# Patient Record
Sex: Female | Born: 1950
Health system: Southern US, Community
[De-identification: ages and names within clinical notes are randomized; demographics above are authoritative.]

## PROBLEM LIST (undated history)

## (undated) DIAGNOSIS — Z973 Presence of spectacles and contact lenses: Secondary | ICD-10-CM

## (undated) DIAGNOSIS — I7771 Dissection of carotid artery: Secondary | ICD-10-CM

## (undated) DIAGNOSIS — F419 Anxiety disorder, unspecified: Secondary | ICD-10-CM

## (undated) DIAGNOSIS — F17200 Nicotine dependence, unspecified, uncomplicated: Secondary | ICD-10-CM

## (undated) DIAGNOSIS — K7460 Unspecified cirrhosis of liver: Secondary | ICD-10-CM

## (undated) DIAGNOSIS — I2510 Atherosclerotic heart disease of native coronary artery without angina pectoris: Secondary | ICD-10-CM

## (undated) DIAGNOSIS — Z9289 Personal history of other medical treatment: Secondary | ICD-10-CM

## (undated) DIAGNOSIS — I639 Cerebral infarction, unspecified: Secondary | ICD-10-CM

## (undated) DIAGNOSIS — G8929 Other chronic pain: Secondary | ICD-10-CM

## (undated) DIAGNOSIS — I10 Essential (primary) hypertension: Secondary | ICD-10-CM

## (undated) DIAGNOSIS — K759 Inflammatory liver disease, unspecified: Secondary | ICD-10-CM

## (undated) DIAGNOSIS — R011 Cardiac murmur, unspecified: Secondary | ICD-10-CM

## (undated) DIAGNOSIS — E039 Hypothyroidism, unspecified: Secondary | ICD-10-CM

## (undated) DIAGNOSIS — E669 Obesity, unspecified: Secondary | ICD-10-CM

## (undated) DIAGNOSIS — F32A Depression, unspecified: Secondary | ICD-10-CM

## (undated) DIAGNOSIS — N200 Calculus of kidney: Secondary | ICD-10-CM

## (undated) DIAGNOSIS — E785 Hyperlipidemia, unspecified: Secondary | ICD-10-CM

## (undated) DIAGNOSIS — I214 Non-ST elevation (NSTEMI) myocardial infarction: Secondary | ICD-10-CM

## (undated) DIAGNOSIS — M545 Low back pain, unspecified: Secondary | ICD-10-CM

## (undated) DIAGNOSIS — Z8719 Personal history of other diseases of the digestive system: Secondary | ICD-10-CM

## (undated) DIAGNOSIS — K219 Gastro-esophageal reflux disease without esophagitis: Secondary | ICD-10-CM

## (undated) DIAGNOSIS — C50912 Malignant neoplasm of unspecified site of left female breast: Secondary | ICD-10-CM

## (undated) DIAGNOSIS — I4891 Unspecified atrial fibrillation: Secondary | ICD-10-CM

## (undated) DIAGNOSIS — G43909 Migraine, unspecified, not intractable, without status migrainosus: Secondary | ICD-10-CM

## (undated) DIAGNOSIS — C4362 Malignant melanoma of left upper limb, including shoulder: Secondary | ICD-10-CM

## (undated) DIAGNOSIS — J449 Chronic obstructive pulmonary disease, unspecified: Secondary | ICD-10-CM

## (undated) DIAGNOSIS — M1990 Unspecified osteoarthritis, unspecified site: Secondary | ICD-10-CM

## (undated) DIAGNOSIS — F329 Major depressive disorder, single episode, unspecified: Secondary | ICD-10-CM

## (undated) DIAGNOSIS — R001 Bradycardia, unspecified: Secondary | ICD-10-CM

## (undated) DIAGNOSIS — C44701 Unspecified malignant neoplasm of skin of unspecified lower limb, including hip: Secondary | ICD-10-CM

## (undated) DIAGNOSIS — Z87442 Personal history of urinary calculi: Secondary | ICD-10-CM

## (undated) HISTORY — PX: LARYNX SURGERY: SHX692

## (undated) HISTORY — PX: SPLENECTOMY, TOTAL: SHX788

## (undated) HISTORY — DX: Bradycardia, unspecified: R00.1

## (undated) HISTORY — DX: Inflammatory liver disease, unspecified: K75.9

## (undated) HISTORY — DX: Hyperlipidemia, unspecified: E78.5

## (undated) HISTORY — DX: Atherosclerotic heart disease of native coronary artery without angina pectoris: I25.10

## (undated) HISTORY — PX: MELANOMA EXCISION: SHX5266

## (undated) HISTORY — PX: APPENDECTOMY: SHX54

## (undated) HISTORY — DX: Unspecified cirrhosis of liver: K74.60

## (undated) HISTORY — PX: CHOLECYSTECTOMY OPEN: SUR202

## (undated) HISTORY — DX: Essential (primary) hypertension: I10

## (undated) HISTORY — PX: BREAST LUMPECTOMY: SHX2

## (undated) HISTORY — DX: Obesity, unspecified: E66.9

## (undated) HISTORY — DX: Chronic obstructive pulmonary disease, unspecified: J44.9

## (undated) HISTORY — PX: BREAST BIOPSY: SHX20

## (undated) HISTORY — PX: INGUINAL HERNIA REPAIR: SUR1180

## (undated) HISTORY — DX: Nicotine dependence, unspecified, uncomplicated: F17.200

---

## 1976-03-16 HISTORY — PX: ABDOMINAL HYSTERECTOMY: SHX81

## 2000-07-12 ENCOUNTER — Emergency Department (HOSPITAL_COMMUNITY): Admission: EM | Admit: 2000-07-12 | Discharge: 2000-07-12 | Payer: Self-pay | Admitting: Emergency Medicine

## 2001-08-07 ENCOUNTER — Encounter: Payer: Self-pay | Admitting: Emergency Medicine

## 2001-08-07 ENCOUNTER — Emergency Department (HOSPITAL_COMMUNITY): Admission: EM | Admit: 2001-08-07 | Discharge: 2001-08-07 | Payer: Self-pay | Admitting: Emergency Medicine

## 2001-08-08 ENCOUNTER — Ambulatory Visit (HOSPITAL_COMMUNITY): Admission: RE | Admit: 2001-08-08 | Discharge: 2001-08-08 | Payer: Self-pay | Admitting: Otolaryngology

## 2001-08-08 ENCOUNTER — Encounter: Payer: Self-pay | Admitting: Otolaryngology

## 2001-10-11 ENCOUNTER — Emergency Department (HOSPITAL_COMMUNITY): Admission: EM | Admit: 2001-10-11 | Discharge: 2001-10-11 | Payer: Self-pay | Admitting: Emergency Medicine

## 2002-03-30 ENCOUNTER — Emergency Department (HOSPITAL_COMMUNITY): Admission: EM | Admit: 2002-03-30 | Discharge: 2002-03-31 | Payer: Self-pay | Admitting: Emergency Medicine

## 2003-03-17 DIAGNOSIS — I639 Cerebral infarction, unspecified: Secondary | ICD-10-CM

## 2003-03-17 HISTORY — DX: Cerebral infarction, unspecified: I63.9

## 2003-04-17 HISTORY — PX: CAROTID STENT: SHX1301

## 2003-04-22 ENCOUNTER — Emergency Department (HOSPITAL_COMMUNITY): Admission: EM | Admit: 2003-04-22 | Discharge: 2003-04-22 | Payer: Self-pay | Admitting: Emergency Medicine

## 2003-04-25 ENCOUNTER — Ambulatory Visit (HOSPITAL_COMMUNITY): Admission: RE | Admit: 2003-04-25 | Discharge: 2003-04-25 | Payer: Self-pay | Admitting: Family Medicine

## 2003-04-26 ENCOUNTER — Inpatient Hospital Stay (HOSPITAL_COMMUNITY): Admission: EM | Admit: 2003-04-26 | Discharge: 2003-04-30 | Payer: Self-pay | Admitting: Emergency Medicine

## 2003-05-05 ENCOUNTER — Emergency Department (HOSPITAL_COMMUNITY): Admission: EM | Admit: 2003-05-05 | Discharge: 2003-05-05 | Payer: Self-pay | Admitting: *Deleted

## 2003-06-29 ENCOUNTER — Ambulatory Visit (HOSPITAL_COMMUNITY): Admission: RE | Admit: 2003-06-29 | Discharge: 2003-06-29 | Payer: Self-pay | Admitting: Family Medicine

## 2003-07-26 ENCOUNTER — Encounter: Payer: Self-pay | Admitting: Neurology

## 2003-08-20 ENCOUNTER — Inpatient Hospital Stay (HOSPITAL_COMMUNITY): Admission: RE | Admit: 2003-08-20 | Discharge: 2003-08-21 | Payer: Self-pay | Admitting: Interventional Radiology

## 2003-09-12 ENCOUNTER — Ambulatory Visit (HOSPITAL_COMMUNITY): Admission: RE | Admit: 2003-09-12 | Discharge: 2003-09-12 | Payer: Self-pay | Admitting: Interventional Radiology

## 2003-10-25 ENCOUNTER — Emergency Department (HOSPITAL_COMMUNITY): Admission: EM | Admit: 2003-10-25 | Discharge: 2003-10-25 | Payer: Self-pay | Admitting: Emergency Medicine

## 2004-01-31 ENCOUNTER — Ambulatory Visit (HOSPITAL_COMMUNITY): Admission: RE | Admit: 2004-01-31 | Discharge: 2004-01-31 | Payer: Self-pay | Admitting: Interventional Radiology

## 2004-06-20 ENCOUNTER — Ambulatory Visit (HOSPITAL_COMMUNITY): Admission: RE | Admit: 2004-06-20 | Discharge: 2004-06-20 | Payer: Self-pay | Admitting: Neurosurgery

## 2004-08-14 ENCOUNTER — Emergency Department (HOSPITAL_COMMUNITY): Admission: EM | Admit: 2004-08-14 | Discharge: 2004-08-14 | Payer: Self-pay | Admitting: Emergency Medicine

## 2004-10-31 ENCOUNTER — Emergency Department (HOSPITAL_COMMUNITY): Admission: EM | Admit: 2004-10-31 | Discharge: 2004-11-01 | Payer: Self-pay | Admitting: *Deleted

## 2005-01-19 ENCOUNTER — Ambulatory Visit (HOSPITAL_COMMUNITY): Admission: RE | Admit: 2005-01-19 | Discharge: 2005-01-19 | Payer: Self-pay | Admitting: Family Medicine

## 2005-12-16 ENCOUNTER — Ambulatory Visit (HOSPITAL_COMMUNITY): Admission: RE | Admit: 2005-12-16 | Discharge: 2005-12-16 | Payer: Self-pay | Admitting: Family Medicine

## 2005-12-18 ENCOUNTER — Encounter: Payer: Self-pay | Admitting: Interventional Radiology

## 2005-12-23 ENCOUNTER — Ambulatory Visit (HOSPITAL_COMMUNITY): Admission: RE | Admit: 2005-12-23 | Discharge: 2005-12-23 | Payer: Self-pay | Admitting: Interventional Radiology

## 2007-04-22 ENCOUNTER — Ambulatory Visit (HOSPITAL_COMMUNITY): Admission: RE | Admit: 2007-04-22 | Discharge: 2007-04-22 | Payer: Self-pay | Admitting: Family Medicine

## 2007-05-04 ENCOUNTER — Ambulatory Visit (HOSPITAL_COMMUNITY): Admission: RE | Admit: 2007-05-04 | Discharge: 2007-05-04 | Payer: Self-pay | Admitting: Family Medicine

## 2007-10-05 ENCOUNTER — Ambulatory Visit (HOSPITAL_COMMUNITY): Admission: RE | Admit: 2007-10-05 | Discharge: 2007-10-05 | Payer: Self-pay | Admitting: Family Medicine

## 2008-05-30 ENCOUNTER — Inpatient Hospital Stay (HOSPITAL_COMMUNITY): Admission: EM | Admit: 2008-05-30 | Discharge: 2008-06-02 | Payer: Self-pay | Admitting: Emergency Medicine

## 2008-06-02 ENCOUNTER — Encounter: Payer: Self-pay | Admitting: Internal Medicine

## 2008-09-23 ENCOUNTER — Emergency Department (HOSPITAL_COMMUNITY): Admission: EM | Admit: 2008-09-23 | Discharge: 2008-09-23 | Payer: Self-pay | Admitting: Emergency Medicine

## 2008-09-24 ENCOUNTER — Ambulatory Visit (HOSPITAL_COMMUNITY): Admission: RE | Admit: 2008-09-24 | Discharge: 2008-09-24 | Payer: Self-pay | Admitting: Emergency Medicine

## 2008-12-29 ENCOUNTER — Emergency Department (HOSPITAL_COMMUNITY): Admission: EM | Admit: 2008-12-29 | Discharge: 2008-12-29 | Payer: Self-pay | Admitting: Emergency Medicine

## 2009-03-16 HISTORY — PX: COLONOSCOPY: SHX174

## 2009-04-16 ENCOUNTER — Ambulatory Visit (HOSPITAL_COMMUNITY): Admission: RE | Admit: 2009-04-16 | Discharge: 2009-04-16 | Payer: Self-pay | Admitting: Family Medicine

## 2009-05-28 ENCOUNTER — Emergency Department (HOSPITAL_COMMUNITY): Admission: EM | Admit: 2009-05-28 | Discharge: 2009-05-28 | Payer: Self-pay | Admitting: Emergency Medicine

## 2010-04-06 ENCOUNTER — Encounter: Payer: Self-pay | Admitting: Interventional Radiology

## 2010-04-06 ENCOUNTER — Encounter: Payer: Self-pay | Admitting: Family Medicine

## 2010-06-26 LAB — CARDIAC PANEL(CRET KIN+CKTOT+MB+TROPI)
Relative Index: INVALID (ref 0.0–2.5)
Relative Index: INVALID (ref 0.0–2.5)
Total CK: 52 U/L (ref 7–177)
Total CK: 56 U/L (ref 7–177)
Total CK: 58 U/L (ref 7–177)
Troponin I: <0.01 ng/mL (ref 0.00–0.06)
Troponin I: <0.01 ng/mL (ref 0.00–0.06)

## 2010-06-26 LAB — CBC
Hemoglobin: 13 g/dL (ref 12.0–15.0)
MCHC: 34 g/dL (ref 30.0–36.0)
WBC: 10.2 10*3/uL (ref 4.0–10.5)

## 2010-06-26 LAB — APTT: aPTT: 26 seconds (ref 24–37)

## 2010-06-26 LAB — DIFFERENTIAL
Lymphocytes Relative: 23 % (ref 12–46)
Lymphs Abs: 2.3 10*3/uL (ref 0.7–4.0)
Monocytes Relative: 8 % (ref 3–12)
Neutrophils Relative %: 69 % (ref 43–77)

## 2010-06-26 LAB — COMPREHENSIVE METABOLIC PANEL
ALT: 17 U/L (ref 0–35)
Albumin: 3.7 g/dL (ref 3.5–5.2)
CO2: 27 mEq/L (ref 19–32)
GFR calc non Af Amer: >60 mL/min (ref >60)
Glucose, Bld: 90 mg/dL (ref 70–99)
Potassium: 3.7 mEq/L (ref 3.5–5.1)
Sodium: 133 mEq/L — ABNORMAL LOW (ref 135–145)

## 2010-06-26 LAB — MAGNESIUM: Magnesium: 2.1 mg/dL (ref 1.5–2.5)

## 2010-06-26 LAB — BASIC METABOLIC PANEL
BUN: 14 mg/dL (ref 6–23)
CO2: 28 mEq/L (ref 19–32)
Creatinine, Ser: 0.63 mg/dL (ref 0.4–1.2)
GFR calc Af Amer: >60 mL/min (ref >60)
Glucose, Bld: 97 mg/dL (ref 70–99)

## 2010-06-26 LAB — LIPID PANEL
HDL: 35 mg/dL — ABNORMAL LOW (ref >39)
LDL Cholesterol: 109 mg/dL — ABNORMAL HIGH (ref 0–99)
Total CHOL/HDL Ratio: 5.9 RATIO
Triglycerides: 304 mg/dL — ABNORMAL HIGH (ref <150)
VLDL: 61 mg/dL — ABNORMAL HIGH (ref 0–40)

## 2010-06-26 LAB — CK TOTAL AND CKMB (NOT AT ARMC): Total CK: 64 U/L (ref 7–177)

## 2010-06-26 LAB — PROTIME-INR: Prothrombin Time: 14.5 seconds (ref 11.6–15.2)

## 2010-06-26 LAB — TSH: TSH: 1.682 u[IU]/mL (ref 0.350–4.500)

## 2010-06-26 LAB — TROPONIN I: Troponin I: 0.01 ng/mL (ref 0.00–0.06)

## 2010-07-29 NOTE — Group Therapy Note (Signed)
[name], [name]             ACCOUNT NO.:  [id number]   MEDICAL RECORD NO.:  [id number]          PATIENT TYPE:  INP   LOCATION:  A219                          FACILITY:  [facility]   PHYSICIAN:  [name]. [name], M.D.DATE OF BIRTH:  1950/11/20   DATE OF PROCEDURE:  DATE OF DISCHARGE:                                 PROGRESS NOTE   SUBJECTIVE:  She still having pain in left arm.  This might be somewhat  worse on yesterday.   OBJECTIVE:  Temperature 98.1, pulse 59, respiratory rate 20, and blood  pressure 110/71.  She has a 5/5 grip strength on the right hand and 4/5  grip strength in the left hand.  She appears to be oriented, alert, in  no acute distress.  Speech is normal without slurring.  Sentence  structures intact.  The heart is regular rhythm rate of 60.  The lungs  are clear throughout moving air well.  There is no edema of the ankles.  O2 sats 97% on room air.   ASSESSMENT:  1. Question right brain cerebrovascular accident.  2. Benign essential hypertension.  3. Chronic anxiety.  4. Chronic pain.   PLAN:  I have reinstituted her furosemide 40 mg daily and her KCl daily,  and I am also putting her back on Hyzaar 100 daily with Tranxene 7.5 mg  t.i.d. and Lorcet Plus q.i.d. p.r.n. pain.  MRI of the brain today.  Continue the Lovenox injections, and I have kept her off the Plavix and  aspirin since she developed symptoms while on these.  We will discuss  with her neurologist tomorrow.      [name]. [name], M.D.  Electronically Signed     SDK/MEDQ  D:  05/31/2008  T:  05/31/2008  Job:  [id number]

## 2010-07-29 NOTE — H&P (Signed)
[name], [name]             ACCOUNT NO.:  [id number]   MEDICAL RECORD NO.:  [id number]          PATIENT TYPE:  INP   LOCATION:  5122                         FACILITY:  [facility]   PHYSICIAN:  Elliot Cousin, M.D.    DATE OF BIRTH:  Apr 09, 1950   DATE OF ADMISSION:  06/01/2008  DATE OF DISCHARGE:                              HISTORY & PHYSICAL   CHIEF COMPLAINT:  Weakness of the left upper extremity. Transfer from  [facility].   HISTORY OF PRESENT ILLNESS:  This is a 60 year old white woman with past  medical history of hypertension, hyperlipidemia, ongoing tobacco abuse,  history of stroke, depression, GERD, and migraines. She presented to  [facility] on May 30, 2008, for a complaint of 3 days of  weakness of the left upper extremity associated with numbness, tingling,  and pain.  She also has pain and tingling in her left chest, a feeling  similar to the one she had at the time of her previous stroke in 2005.  She was evaluated at [facility] with a noncontrast head CT,  which was negative.  Her cardiac enzymes were normal.  The MRI of the  brain revealed left maxillary sinusitis, but no acute stroke.  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  The internal carotid artery was stented by  Dr. Corliss Skains at that time.  The patient was transferred to [facility]  today for a new cerebral angiogram to evaluate for re-dissection and for  stent reassessment  by Dr. Bedelia Person.   PAST MEDICAL HISTORY:  Please see the detailed history and physical and  discharge summary dictated by Dr. [name] and the HPI above.   SOCIAL HISTORY:  The patient lives in [place].  She is divorced, but  has a female friend which is in the room with her.  She has 2 children.   MEDICATIONS:  1. Lipitor 20 mg p.o. daily.  2. Plavix 75 mg p.o. daily.  3. Aspirin 325 mg p.o. daily.  4. Hyzaar 100 mg p.o. daily.  5.  Tranxene 7.5 mg p.o. t.i.d.  6. Lasix 40 mg p.o. daily.  7. Lorcet 7.5/325 mg p.o. t.i.d. p.r.n. pain.  8. Prilosec 20 mg p.o. daily.   ALLERGIES:  She is allergic to TAPE, but no known drug allergies.   REVIEW OF SYSTEMS:  No visual symptoms, no nausea, vomiting, diarrhea,  or constipation, shortness of breath, but positive for chest pain as  described in the HPI.   PHYSICAL EXAMINATION:  VITAL SIGNS:  Temperature 97.5, pulse 85,  respiration rate 20, blood pressure 122/89, oxygen saturation 99% on  room air.  GENERAL:  She is a pleasant lady in no acute distress.  HEENT:  Head is normocephalic, nontraumatic.  PERRL.  EOMI.  Conjunctivae clear.  Sclerae white.  Oropharynx with moist mucous  membranes.  PULMONARY:  Clear to auscultation bilaterally.  CARDIAC:  Regular rate and rhythm.  No murmurs, rubs, or gallops.  No  carotid bruits.  GI/ABDOMEN:  Soft, nontender, nondistended.  Bowel sounds  positive. No  masses.  No hepatoslenomegaly.  EXTREMITIES:  No edema.  NEUROLOGIC:  Muscle strength 4/5 in left upper extremity, 4.5/5 in left  lower extremity, and 5/5 in the right extremities.  Sensation decreased  in the left upper extremities.  Cranial nerves II through XII intact.  PERRL.  No ptosis bilaterally.   LABORATORY DATA:  Stroke panel: white blood cells 10.2 with 69% PMNs,  hemoglobin 13, platelets 381.  Coags normal.  Sodium 133, potassium 3.7,  chloride 100, bicarb 27, BUN 17, creatinine 0.7, glucose 90, bilirubin  0.3, alk phos 45, AST 22, ALT 17, protein 6.3, albumin 3.7, calcium 9.5.   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  1. Left upper extremity weakness and pain.  This is possibly a new      stroke in the same ICA territory.  She had an ICA dissection in      2005 and had a repeat angiogram in 2007 for similar symptoms and  at      that time she had remodeling and a decrease in aneurysm size.  She      returns today with new similar symptoms evolving in the same manner      as at the time of her previous stroke.  She had a normal CT and MRI      of the brain at [facility]; however, due to high suspicion for a      new evolving dissection, she was transferred to [facility] for      another cerebral angiogram by IR (scheduled for tomorrow am).  We will also check vitamin B12, TSH, fasting lipid profile, UDS,  urinalysis, and hemoglobin A1c.  We will also cycle cardiac enzymes and  EKGs due to associated chest pain.  We will continue aspirin and Plavix  after the angiogram.  1. Hyperlipidemia.  We will check a fasting lipid profile and continue      her statin.  2. Hypertension.  Her blood pressure is within normal limits.  We are      going to hold the Lasix while she is being hydrated for the      angiogram.  We will also hold Hyzaar for the next 2 days because of      the contrast used with the angiogram.  3. Anxiety.  Continue Tranxene.  4. Tobacco abuse.  Official tobacco cessation counseling will be      ordered.  Of note, the patient has decreased her smoking from one      pack of cigarettes a day to half PPD.  5. Prophylaxis: SCDs and Protonix.      Carlus Pavlov, M.D.  Electronically Signed      Elliot Cousin, M.D.  Electronically Signed    CG/MEDQ  D:  06/01/2008  T:  06/02/2008  Job:  [id number]   cc:   [name]. [name], M.D.

## 2010-07-29 NOTE — Discharge Summary (Signed)
[name], MACCONNELL             ACCOUNT NO.:  [id number]   MEDICAL RECORD NO.:  [id number]          PATIENT TYPE:  INP   LOCATION:  A219                          FACILITY:  [facility]   PHYSICIAN:  [name]. [name], M.D.DATE OF BIRTH:  July 05, 1950   DATE OF ADMISSION:  05/30/2008  DATE OF DISCHARGE:  03/19/2010LH                               DISCHARGE SUMMARY   This 60 year old was admitted to the hospital with weakness and pain in  the left arm felt to be possibly secondary to right brain CVA.  She had  a benign 3-day hospitalization extending from May 30, 2008 through  June 01, 2008.  Vital signs remained stable.   A stroke panel done on admission which included a CBC, INR, BMP and LFTs  as well as cardiac enzymes was essentially normal.  She had a head CT on  admission which showed no acute intracranial abnormalities but an air-  fluid level in the left maxillary sinus and, therefore, question acute  sinusitis.  MRI of the brain still showed the left maxillary sinusitis  but no acute or subacute infarction.   The patient was admitted to the hospital, kept on Hep-Lock, put on  routine medications which included Cozaar 100 mg daily, pantoprazole 40  mg daily, hydrochlorothiazide 25 mg daily, furosemide 40 mg daily and  diazepam 5 mg t.i.d.  She had been on Plavix and aspirin, but these were  stopped, and she was put on Lovenox 85 mg q.12 h.  She received  hydrocodone/APAP for pain.   The weak and burning feelings in the left arm and shoulder persisted  during her hospitalization.  These continued to radiate through to the  left pectoralis major muscle and to the point where she had difficulty  extending left shoulder and arm due to a heavy feeling.   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  Therefore, at  the time of  discharge we were arranging for her to be either transferred  by ambulance or driven by family down to [facility] to see Dr. Corliss Skains  (the interventional radiologist who had treated her in 2007), or whoever  was covering him, for further evaluation of these symptoms.  She is to  continue with her medications at home which include:   1. Lorcet Plus q.i.d. p.r.n. pain.  2. Tranxene 7.5 mg t.i.d. p.r.n. anxiety.  3. Hyzaar 100/25 daily.  4. Furosemide 40 mg daily.  5. KCl 20 mg daily.   FINAL DISCHARGE DIAGNOSES:  1. Presumptive right brain cerebrovascular accident.  2. Benign essential hypertension.  3. Anticoagulation.  4. Tobacco use disorder.  5. Status post splenectomy.  6. Gastroesophageal reflux disease.  7. Hypercholesterolemia.  8. Reactive depression.  9. Obesity.   Again, we are going to arrange for some sort of followup with Dr.  Corliss Skains, interventional radiologist, who put stents in her brain 2  years ago.  She will also get followup in our  office within the week.      [name]. [name], M.D.  Electronically Signed     SDK/MEDQ  D:  06/01/2008  T:  06/01/2008  Job:  [id number]

## 2010-07-29 NOTE — Discharge Summary (Signed)
[name], [name]             ACCOUNT NO.:  [id number]   MEDICAL RECORD NO.:  [id number]          PATIENT TYPE:  INP   LOCATION:  5122                         FACILITY:  [facility]   PHYSICIAN:  Eduard Clos, MDDATE OF BIRTH:  Sep 05, 1950   DATE OF ADMISSION:  06/01/2008  DATE OF DISCHARGE:  06/02/2008                               DISCHARGE SUMMARY   COURSE IN THE HOSPITAL:  This is a 60 year old female with a previous  history of left-sided carotid artery dissection with stroke, history of  hypertension, hyperlipidemia, and ongoing tobacco abuse.  She was  transferred from [facility] with questionable left-sided carotid  artery dissection.  The patient did have a stent placed for that same  event, which happened earlier and Dr. Corliss Skains had done the procedure.  Dr. Corliss Skains was again consulted.  The patient had undergone cerebral  angiogram and at this time, I did discuss with Dr. Corliss Skains who advised  that the patient has no dissection as both stents well positioned and  advised to discharge home as the patient has no focal deficits at this  time and to stop her Plavix and he will be following her in 1 year's  time.   The patient on exam at this time has no focal deficits.  Did complain of  some weakness on the left upper extremity, which has completely resolved  now and the patient will be discharged home after the patient ambulates.  The patient was strongly advised to quit smoking and MRI did show some  sinusitis features, the MRI was done at [facility].  At the  time of this dictation, the patient was hemodynamically stable.   PROCEDURES DONE DURING THIS STAY:  Cerebral angiogram, rule out carotid  dissection.   FINAL DIAGNOSES:  1. Left intracranial artery dissection ruled out.  2. Possible transient ischemic attack.  3. Hypertension.  4. Hyperlipidemia.  5. Ongoing tobacco abuse.   MEDICATION AT DISCHARGE:  1. Levatol 20 mg p.o. daily.  2.  Aspirin 325 mg p.o. daily.  3. Hyzaar 100 mg p.o. daily.  4. Tranxene 11.25 mg p.o. 3 times daily.  5. Lasix 40 mg p.o. daily.  6. Lorcet 7.5/325 mg p.o. 3 times daily as needed for pain.  7. Prilosec 20 mg p.o. daily.  8. Chantix starter dose pack.  9. Ciprofloxacin 500 mg p.o. b.i.d. for 5 days for sinusitis.   PLAN:  The patient advised to follow up with primary care physician, Dr.  [name] within a week's time to follow up with Dr. Corliss Skains as  scheduled.  The patient strongly advised to quite smoking.  The patient  is given a script of Chantix, which the patient is advised to try.  The  patient is to be on a cardiac-healthy diet.  Activity as tolerated.      Eduard Clos, MD  Electronically Signed     ANK/MEDQ  D:  06/02/2008  T:  06/03/2008  Job:  [id number]

## 2010-07-29 NOTE — H&P (Signed)
[name], [name]             ACCOUNT NO.:  [id number]   MEDICAL RECORD NO.:  [id number]          PATIENT TYPE:  INP   LOCATION:  A309                          FACILITY:  [facility]   PHYSICIAN:  [name]. [name], M.D.DATE OF BIRTH:  June 23, 1950   DATE OF ADMISSION:  05/30/2008  DATE OF DISCHARGE:  LH                              HISTORY & PHYSICAL   HISTORY OF PRESENT ILLNESS:  This 60 year old woman presented to the  office today with 3-day history of weakness in the left arm along with  some tingling and pain the left arm and chest.  She told me, this fell  just like it had when she had her stroke in the past.   She has a long and complicated medical history including tobacco use  disorder, migraine headaches, menopausal disorder, history of renal  stones, anxiety, bilateral carpal tunnel syndrome, status post  splenectomy, GERD, reactive depression, benign essential hypertension,  hypercholesterolemia, and obesity.  A number of these chronic medical  problems reviewed on April 11, 2008.  At that time, she had and H1N1  flu vaccine and was continued on medication, which include Plavix 75 mg  daily, furosemide 40 mg daily, Premarin 1.25 mg daily, Hyzaar 100/25  daily, Celexa 20 mg daily, TriCor 145 mg daily, Nexium 40 mg daily,  hydrocodone APAP 7.5/ 650 t.i.d. for pain ECASA 325 mg daily, Tranxene  7.5 mg  t.i.d., temazepam 30 mg at bedtime, Lipitor 20 mg at bedtime,  and ProAir inhaler 2 puffs q.4 h. for breathing.   She was initially seen in the office and then when it became apparent to  me that she need further studies and was semi-emergent, I transferred  her to emergency room and discussed her case with the emergency room  doctor over the phone.  In the office she tells me she is still smoking  half pack a day and she really was concerned and worried about her arm  weakness.   PHYSICAL EXAMINATION:  VITAL SIGNS:  Her weights was 193 pounds, BP  116/78, and pulse of  60.  SKIN:  Color was normal.  GENERAL:  She appeared to be oriented and alert.  Sentence structure was  intact.  There is no slurring of her speech.  HEART:  Regular rhythm, rate about 70.  I did not auscultate any carotid  bruits.  LUNGS:  Appeared clear throughout moving air well.  No intercostal  retractions, no use accessory muscles for respiration.  ABDOMEN:  Soft without organomegaly, mass, or tenderness.  EXTREMITIES:  There is no edema of the ankles.  On my exam, the patient  had 5/5 grip strength in the right and 3-4/5 grip strength in left, but  the ER physician's exam did not show the weakness in the left.   She was then seen in the hospital by the emergency room doctor,  apparently had a nonenhanced CT of the head, which was normal.  Other  tests were pending.   ADMISSION DIAGNOSES:  1. Probable right brain cerebrovascular accident.  2. Benign essential hypertension.  3. Anticoagulation with aspirin and Plavix.  4.  Tobacco use disorder.  5. Status post splenectomy.  6. Gastroesophageal reflux disease.  7. Hypercholesterolemia.  8. Reactive depression.  9. Obesity.  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   She will be on Lovenox b.i.d. subcu.  We will continue meds as need be  in the hospital.  We may need to get Neurology to see her.      [name]. [name], M.D.  Electronically Signed     SDK/MEDQ  D:  05/30/2008  T:  05/31/2008  Job:  [id number]

## 2010-08-01 NOTE — Discharge Summary (Signed)
NAME:  [name], [name]                       ACCOUNT NO.:  [id number]   MEDICAL RECORD NO.:  [id number]                   PATIENT TYPE:  INP   LOCATION:  3006                                 FACILITY:  [facility]   PHYSICIAN:  Pramod P. Pearlean Brownie, MD                 DATE OF BIRTH:  Dec 24, 1950   DATE OF ADMISSION:  04/26/2003  DATE OF DISCHARGE:  04/30/2003                                 DISCHARGE SUMMARY   ADMISSION DIAGNOSIS:  Left hemispheric stroke.   DISCHARGE DIAGNOSES:  1. Small left parietal infarction secondary to embolization from left high     cervical and cavernous internal carotid artery dissection with thrombus.  2. Hypertension.  3. Hyperlipidemia.   HISTORY OF PRESENT ILLNESS:  [name] if a 60 year old Caucasian lady who  was admitted for evaluation of symptoms of left temporal and posterior  auricular headache with transient blindness in the left eye, as well as  right leg numbness.  Kindly see Dr. Anne Hahn' excellent H&P for details.  The  patient's left eye blindness and right leg numbness resolved quickly within  a few minutes, however, she had persistent left temporal headache, as well  as some paresthesias.  She also reported a throbbing sensation in the left  ear which persisted.  She had a CT scan of the head done in the emergency  room on the day of presentation, which was normal, and she was sent home.  She was set up for an MRI as an outpatient, which showed a small left  parietal stroke.  She was admitted following this.  The patient's vascular  risk factors include hypertension, hyperlipidemia, and smoking.  On  examination in the emergency room, the patient actually had a normal  neurological exam with only some mild subjective paresthesias in the left  face and the posterior neck, but no obvious weakness, cranial nerve  deficits, or gait imbalance.  CT scan of the head was unremarkable.  The MRI  of the brain revealed a small high left parietal middle  cerebral artery  branch infarction.  MRA of the neck revealed focal stenosis in the left  cervical and __________ junction of the internal carotid artery with a clear  flap seen with possible thrombus.  This seemed to extend just proximal to  the origin of the ophthalmic artery.  There was no significant stenosis at  either carotid bifurcation in the neck.  The patient was admitted for stroke  risk stratification workup.   LABORATORY DATA:  Chest x-rays were unremarkable.  MRA of the neck revealed  no significant vascular stenosis.  Hemoglobin A1C was normal.  Lipid profile  showed elevated cholesterol of 214, triglycerides of 277, HDL of 41, and LDL  of 118.   HOSPITAL COURSE:  The patient was started on IV heparin and Coumadin for  secondary stroke prevention from the left ICA dissection with a thrombus to  prevent further clot  propagation for a period of three months.  She was  started on Zocor for elevated lipids.  She was asked to quit smoking.  Her  blood pressure remained stable throughout hospitalization.  Telemetry  monitoring did not reveal cardiac arrhythmias.  Carotid ultrasound revealed  no significant vascular stenosis.  Transcranial Doppler studies revealed  abnormal left middle cerebral artery wave form with loss of pulsatility  indicative of proximal high-grade carotid stenosis.  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  This, however, improved during the  hospitalization.  On the day of discharge, the patient had an INR of 1.9.  She was placed on warfarin 2.5 mg a day and advised to see her primary  physician, Dr. [name], on Wednesday, May 02, 2003, for blood draw for  PT INR and further adjustment of her Coumadin.  The plan was to place her on  Coumadin for the next three months and repeat diagnostic cerebral angiogram.  If there is satisfactory recannulization of the  left internal carotid  artery, then Coumadin may be stopped and switched to antiplatelet therapy.  She was asked to follow up with Dr. Pearlean Brownie in his office in six weeks and  with Dr. [name], her primary physician, in two days.   DISCHARGE MEDICATIONS:  1. Coumadin 2.5 mg today and tomorrow.  Dose to be adjusted by Dr. [name]     as per PT INR with INR goal of 2-3.  2. Zocor 20 mg a day.  3. Tranxene 7.5 mg three tablets a day.  4. Premarin 0.625 mg daily.  5. Zantac 150 mg twice a day.  6. Hyzaar one a day.  7. Nicotine patch for smoking cessation.  8. Lexapro 10 mg a day.  9. __________ 60 mg a day.   SPECIAL INSTRUCTIONS:  She was asked to go on a low-salt, low-fat diet and  quit smoking.                                                Pramod P. Pearlean Brownie, MD    PPS/MEDQ  D:  04/30/2003  T:  04/30/2003  Job:  [id number]   cc:   [name]. [name], M.D.  [address] [place], [place] [address]  Fax: [phone]

## 2010-08-01 NOTE — Consult Note (Signed)
[name], [name]             ACCOUNT NO.:  [id number]   MEDICAL RECORD NO.:  [id number]          PATIENT TYPE:  OIB   LOCATION:  2899                         FACILITY:  [facility]   PHYSICIAN:  Sanjeev K. Deveshwar, M.D.DATE OF BIRTH:  [birth year]   DATE OF CONSULTATION:  DATE OF DISCHARGE:                                   CONSULTATION   CHIEF COMPLAINT:  The patient is being admitted for cerebral angiogram.   HISTORY OF PRESENT ILLNESS:  This is a very pleasant 60 year old female who  underwent stenting of her left internal carotid artery in February 2005  after she presented with a small left parietal and left hemispheric CVA.  She initially presented with headache and transient blindness as well as  right leg numbness.  She was found to have left internal carotid artery  dissection, and she underwent stenting performed by Dr. Corliss Skains in  February 2005.  Since that time, she has done fairly well.  She has had  close follow up by Dr. Corliss Skains, and she is here today for a cerebral  angiogram to further evaluate her cerebrovascular disease.   The patient reports that she has not had any new neurological-type symptoms.  She occasionally has a sharp pain in her head, but this is very transient  and brief.  She feels that she has some mild residual left lower extremity  weakness and decreased sensation in the left lower extremity.  However, on  muscle testing, strength appears to be essentially equal bilaterally.   PAST MEDICAL HISTORY:  1.  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 Corliss Skains.  2.  History of hypertension.  3.  Hyperlipidemia.  4.  Remote tobacco use.  5.  Status post splenectomy.  6.  Status post hysterectomy.  7.  Status post benign breast cysts.  8.  Status post cholecystectomy.  9.  History of gastroesophageal reflux disease.  10. Anxiety.  11.  Depression.  12. She had been on Coumadin in the past; however, this was discontinued.   ALLERGIES:  No known drug allergies.   CURRENT MEDICATIONS:  1.  Plavix 75 mg daily.  2.  Zocor 20 mg daily, dosage uncertain.  3.  Enteric coated aspirin 325 mg daily.  She has not taken this for two      weeks.  4.  Hyzaar 100/50 mg once daily.  5.  Tranxene 7.5 mg t.i.d.  6.  Estrace 2 mg daily.   SOCIAL HISTORY:  The patient lives in [place] with her son.  She is  divorced.  She has two children.  She quit smoking 8 months ago.  She smoked  two packs per day for 24 years.  She does not use alcohol.  She is disabled.   FAMILY HISTORY:  Father died at age 70 of colon cancer and MI.  Father just  died recently and she is still grieving.  Mother died at age 7 from an MI.   REVIEW OF  SYSTEMS:  Negative except for the following.  She has had some  recent bronchitis and sinusitis.  She has a residual cough and some  wheezing.  As noted, she has occasional sharp shooting pains in her head.  She has gastroesophageal reflux disease, arthritis in her back.  She states  she bleeds freely and bruises easily.   PHYSICAL EXAMINATION:  GENERAL APPEARANCE:  A very pleasant 60 year old  white female in no acute distress.  VITAL SIGNS:  Blood pressure 114/79, pulse 91, respirations 16, temperature  97, oxygen saturation 97% on room air.  HEENT:  Unremarkable.  NECK:  No bruits.  No jugular venous distention.  HEART:  Regular rate and rhythm with distant heart sounds.  LUNGS:  Slightly decreased but clear.  ABDOMEN:  Soft, nontender.  EXTREMITIES:  Trace edema, pulses are intact.  SKIN:  Warm and dry.  NEUROLOGICAL:  Grossly intact by brief exam.   IMPRESSION:  1.  Patient for cerebral angiogram today.  2.  Status post left internal carotid artery stent secondary to dissection.  3.  Status post left hemispheric CVA.  4.  Hypertension.  5.  Hyperlipidemia.  6.  Long history of tobacco use, quit 8  months ago.  7.  History of anxiety and depression.  8.  Gastroesophageal reflux disease.  9.  Status post multiple surgeries as mentioned.  10. The patient is also allergic to tape.   PLAN:  Angiogram today with further follow up with Dr. Corliss Skains.      DR/MEDQ  D:  06/20/2004  T:  06/20/2004  Job:  [id number]   cc:   [name]. [name], M.D.  [address] [place], [place] [address]  Fax: [phone]   Pramod P. Pearlean Brownie, MD  Fax: [phone]   C. Lesia Sago, M.D.  [address]  [address]  [place]  [place] [address]  Fax: [phone]

## 2010-08-01 NOTE — H&P (Signed)
NAME:  [name], [name]                       ACCOUNT NO.:  [id number]   MEDICAL RECORD NO.:  [id number]                   PATIENT TYPE:  INP   LOCATION:  5506                                 FACILITY:  [facility]   PHYSICIAN:  Marlan Palau, M.D.               DATE OF BIRTH:  April 11, 1950   DATE OF ADMISSION:  04/26/2003  DATE OF DISCHARGE:                                HISTORY & PHYSICAL   HISTORY OF PRESENT ILLNESS:  [name] is a 60 year old right-  handed white female born on November 25, 1950 with a history of hypertension.  This patient comes to [facility] for an evaluation of a left  internal carotid artery dissection.  This occurred about five days prior to  this admission suddenly while at home.  Patient noted onset of left  frontotemporal headache, blindness in the left eye, numbness in the right  leg, some pain along the anterior neck on the left.  Patient went to the  emergency room promptly, had a CT scan of the head that was normal, and was  sent home.  Patient was set up for an MRI scan done as an outpatient, which  revealed a left parietal small cerebrovascular accident with evidence of  dissection on MRI angiogram of the left internal carotid artery.  Patient  claimed that the blindness in the left eye and numbness in the right leg  lasted about a half hour and then cleared.  The patient has had some  persistent left frontal temporal headache, particularly with coughing,  bending, and stooping.  Patient has had some pain in the left neck.  Patient  is sent over to [facility] emergency room for further evaluation./  Patient denied any loss of vision to the right with speech problems.   PAST MEDICAL HISTORY:  1. 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  2. Hypertension.  3. Hypercholesterolemia.  4. 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  5. Hysterectomy.  6. Gallbladder resection.  7. Breast cyst resection on the left  that was benign.  8. History of gastroesophageal reflux disease.  9. Anxiety disorder.   MEDICATIONS:  1. Estrogen replacement, unknown dose.  2. Tranxene 7.5 mg t.i.d.  3. Hyzaar, unknown dose.  4. Patient was just placed on a new blood pressure pill she does not know     the name of.  5. Allegra 60 mg a day.  6. Zantac for her stomach.   ALLERGIES:  Patient has no known allergies.   Smokes a pack and a half of cigarettes a day.  Does drink alcohol on  occasion.   SOCIAL HISTORY:  Patient is divorced.  Has two children.  Lives in the  [place] area.  Does not work.   FAMILY HISTORY:  Notable in that mother died with an MI.  Father is alive  with diabetes, cancer, hypertension.  Patient has two brothers,  one with  diabetes and hypertension.  Two sisters, one with renal cell carcinoma.   REVIEW OF SYSTEMS:  Notable for problems with feeling cold.  No fevers or  chills.  Patient does note a headache, as above.  Began having headaches two  weeks ago.  Patient denies chest pain, shortness of breath.  Denies any mid  thoracic pain, abdominal pain.  Has had some nausea.  No vomiting.  Denies  any problems with bowel or bladder.  Denies any gait disturbance, falls,  blackout episodes.   PHYSICAL EXAMINATION:  VITAL SIGNS:  Blood pressure 178/101, heart rate 102,  respiratory rate 22, temperature afebrile.  GENERAL:  This patient is a fairly well developed white female who is alert  and cooperative at the time of examination.  HEENT:  Head is atraumatic.  Eyes:  Pupils are anisocoric with the left  pupil being 3-4 mm and the right pupil being 5-6 mm.  Both reactive to  light.  Definite ptosis is noted on the left.  NECK:  Supple.  No carotid bruits noted.  LUNGS:  Clear.  HEART:  Regular rate and rhythm.  No obvious murmurs or rubs noted.  ABDOMEN:  Positive bowel sounds.  No organomegaly or tenderness is noted.  EXTREMITIES:  Without significant edema.  NEUROLOGIC:  Cranial  nerves as above.  Facial asymmetry is present.  Patient  has good sensation in the face to pinprick and soft touch bilaterally.  If  anything, patient had some slight decrease in pinprick sensation on the left  lower face, compared to the right.  Patient has full extraocular movements.  Visual fields are full.  Speech is well enunciated, not aphasic.  Motor  testing reveals 5/5 strength in all four.  Good symmetric motor tone and  strength throughout.  Sensory testing is intact to vibratory sensation  throughout.  Pinprick sensation is decreased in the left arm.  The left leg  is greater than the right.  Deep tendon reflexes are relatively symmetric  throughout.  Toes are neutral bilaterally.  Patient has fair  finger/nose/finger and toe-to-finger bilaterally.  No drift is seen.  Patient is not ambulated.   LABORATORY VALUES:  Pending at this time.   EKG reveals normal sinus rhythm, anterior infarct, age undetermined.  Heart  rate of 83.   MRI scan of the brain is as above.   IMPRESSION:  1. Left internal carotid artery dissection with small left parietal infarct.  2. Hypertension.  3. Hypercholesterolemia.   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  Patient has  minimal other objective findings at this point.  Patient will be brought in  for further evaluation and management of the above problem.   PLAN:  1. Admission to [facility].  2. IV heparin.  3. Conversion to Coumadin.  4. MRI angiogram of the extracranial vessels.   Follow patient's clinical course while in-house.  Blood pressure will need  to be managed a bit better than it is now.                                                Marlan Palau, M.D.    CKW/MEDQ  D:  04/26/2003  T:  04/26/2003  Job:  [id number]   cc:   Lacy Duverney, M.D.

## 2010-08-01 NOTE — Consult Note (Signed)
[name] [name], [name]             ACCOUNT NO.:  [id number]   MEDICAL RECORD NO.:  [id number]          PATIENT TYPE:  OUT   LOCATION:  XRAY                         FACILITY:  [facility]   PHYSICIAN:  [name] [name] [name], M.D.DATE OF BIRTH:  1950/09/10   DATE OF CONSULTATION:  12/18/2005  DATE OF DISCHARGE:                                   CONSULTATION   DATE OF CONSULT:  December 18, 2005.   CHIEF COMPLAINT:  New onset of left-sided weakness.   HISTORY OF PRESENT ILLNESS:  This is a 60 year old female who had a left  internal carotid artery stent placed in February 2005 by Dr. Corliss [name] for a  left internal carotid artery dissection.  The patient initially presented  with symptoms of a left CVA including headache, transient blindness and  right lower extremity weakness and numbness.  The patient's symptoms  eventually resolved after placement of the stent.  She had a follow-up  angiogram performed June 20, 2004 that showed continued patency of the  Neuroform stents.  The patient did have a persistent outpouching in the  cervical petrous junction with stagnant contrast consistent with the  previously noted pseudoaneurysm.  Dr. Corliss [name] recommended continued  aspirin and Plavix therapy with a follow-up angiogram in approximately 4-5  months.   Approximately 1 month ago, the patient developed left-sided weakness and  numbness.  She saw her primary care physician, Dr. Sudie [name], who ordered an  MRI of the brain with and without contrast as well as an MRA of the head and  neck.  The studies showed no new CVA.  Again, there was felt to be continued  patency of the previously placed stents.  The patient presents today to talk  to Dr. Corliss [name] about her recent symptoms.   PAST MEDICAL HISTORY:  1. Significant for a left hemispheric CVA in February 2005 felt to be      secondary to a left internal carotid artery dissection which was      stented by Dr. Corliss [name].  2. History of  hypertension.  3. Hyperlipidemia.  4. History of tobacco use.  5. Gastroesophageal reflux disease.  6. Anxiety and depression.  7. She had been on Coumadin in the past.   SURGICAL HISTORY:  1. Significant for splenectomy.  2. Hysterectomy.  3. Benign breast cyst.  4. Cholecystectomy.  5. She denies any previous problems with anesthesia.   ALLERGIES:  No known drug allergies.  She is allergic to TAPE.   CURRENT MEDICATIONS:  1. Hydrocodone t.i.d.  2. Hyzaar 100/25 daily.  3. Citalopram 20 mg daily.  4. Plavix 75 mg daily.  5. Aspirin 325 mg daily.  6. Lasix 40 mg daily.  7. Nexium 40 mg daily.  8. Premarin 1.25 mg daily.  9. Simvastatin 20 mg daily.  10.Tricor 145 mg daily.  11.Clorazepate 7.5 mg t.i.d.   SOCIAL HISTORY:  The patient lives in [place].  She is divorced.  She has  two children.  She is trying to quit smoking.  She did smoke 2 packs per day  for 24 years.  She does not use alcohol.  She  is on disability.   FAMILY HISTORY:  Her father died at age 63 from colon cancer and an MI.  Her  mother died at age 18 from an MI.   IMPRESSION AND PLAN:  As noted, the patient presents today to discuss new  onset of left-sided weakness and numbness.  Dr. Corliss [name] reviewed the  results of the patient's recent MRI/MRA with the patient.  He pointed out  the previously placed stents and the continued patency at the site of the  previous pseudoaneurysm.  Again, there was no acute infarct noted.  Dr.  Corliss [name] did feel that she should have a cerebral angiogram to further  evaluate the symptoms she is experiencing.  This has been scheduled for this  coming Wednesday, December 23, 2005.  All of the patient's questions were  answered.  Greater than 30 minutes was spent on this consult.      [name] [name] [name], P.A.    ______________________________  [name] [name] [name], M.D.    DR/MEDQ  D:  12/18/2005  T:  12/19/2005  Job:  [id number]   cc:   [name] [name] [name], M.D.   [name] P. Pearlean Brownie, MD  [name] [name] [name], M.D.

## 2010-09-09 ENCOUNTER — Inpatient Hospital Stay (HOSPITAL_COMMUNITY)
Admission: EM | Admit: 2010-09-09 | Discharge: 2010-09-12 | DRG: 192 | Disposition: A | Discharge status: Home / Self Care | Payer: PRIVATE HEALTH INSURANCE | Attending: Family Medicine | Admitting: Family Medicine

## 2010-09-09 ENCOUNTER — Emergency Department (HOSPITAL_COMMUNITY): Payer: PRIVATE HEALTH INSURANCE

## 2010-09-09 DIAGNOSIS — J441 Chronic obstructive pulmonary disease with (acute) exacerbation: Principal | ICD-10-CM | POA: Diagnosis present

## 2010-09-09 DIAGNOSIS — I10 Essential (primary) hypertension: Secondary | ICD-10-CM | POA: Diagnosis present

## 2010-09-09 DIAGNOSIS — E7800 Pure hypercholesterolemia, unspecified: Secondary | ICD-10-CM | POA: Diagnosis present

## 2010-09-09 DIAGNOSIS — F17200 Nicotine dependence, unspecified, uncomplicated: Secondary | ICD-10-CM | POA: Diagnosis present

## 2010-09-09 DIAGNOSIS — R0789 Other chest pain: Secondary | ICD-10-CM

## 2010-09-09 LAB — BLOOD GAS, ARTERIAL
O2 Content: 2 L/min
O2 Saturation: 96.4 %
Patient temperature: 37
pH, Arterial: 7.438 — ABNORMAL HIGH (ref 7.350–7.400)

## 2010-09-09 LAB — DIFFERENTIAL
Basophils Absolute: 0.1 10*3/uL (ref 0.0–0.1)
Basophils Relative: 1 % (ref 0–1)
Eosinophils Absolute: 0.3 10*3/uL (ref 0.0–0.7)
Eosinophils Relative: 3 % (ref 0–5)
Lymphocytes Relative: 36 % (ref 12–46)

## 2010-09-09 LAB — BASIC METABOLIC PANEL
BUN: 14 mg/dL (ref 6–23)
CO2: 28 mEq/L (ref 19–32)
Calcium: 10.5 mg/dL (ref 8.4–10.5)
Creatinine, Ser: 0.77 mg/dL (ref 0.50–1.10)
GFR calc Af Amer: >60 mL/min (ref >60)

## 2010-09-09 LAB — TROPONIN I: Troponin I: <0.3 ng/mL (ref <0.30)

## 2010-09-09 LAB — CBC
MCHC: 33.3 g/dL (ref 30.0–36.0)
Platelets: 467 10*3/uL — ABNORMAL HIGH (ref 150–400)
RDW: 14.1 % (ref 11.5–15.5)
WBC: 9.8 10*3/uL (ref 4.0–10.5)

## 2010-09-09 LAB — CK TOTAL AND CKMB (NOT AT ARMC): Total CK: 160 U/L (ref 7–177)

## 2010-09-10 DIAGNOSIS — I517 Cardiomegaly: Secondary | ICD-10-CM

## 2010-09-10 DIAGNOSIS — R0602 Shortness of breath: Secondary | ICD-10-CM

## 2010-09-10 LAB — CARDIAC PANEL(CRET KIN+CKTOT+MB+TROPI)
Relative Index: 4.7 — ABNORMAL HIGH (ref 0.0–2.5)
Total CK: 100 U/L (ref 7–177)
Troponin I: <0.3 ng/mL (ref <0.30)

## 2010-09-11 LAB — LIPID PANEL
Cholesterol: 159 mg/dL (ref 0–200)
Total CHOL/HDL Ratio: 4 RATIO

## 2010-09-16 NOTE — Consult Note (Signed)
NAMEANNELY, SLIVA             ACCOUNT NO.:  [id number]  MEDICAL RECORD NO.:  [id number]  LOCATION:  A312                          FACILITY:  [facility]  PHYSICIAN:  Jesse Sans. Oneal Schoenberger, MD, FACCDATE OF BIRTH:  1950-04-22  DATE OF CONSULTATION:  09/09/2010 DATE OF DISCHARGE:                                CONSULTATION   I was asked by Dr. Katharine Look to evaluate Rebecca Eaton for chest discomfort and shortness of breath.  HISTORY OF PRESENT ILLNESS:  Ms. Morr is a 60 year old divorced white female who was admitted early this morning after having 24 to 36-hour period of progressive shortness of breath and cough.  She developed some chest tightness last evening particularly under her ribs.  She denied any hemoptysis but did have productive sputum.  She has a history of severe COPD with a heavy smoking history.  She apparently quit over 2 years ago; however, she had a cigarette yesterday when she was upset with her one of her children.  She says she has not smoked any more than that.  She denies any exertional angina other than some dyspnea on exertion. She had had no orthopnea, PND, or peripheral edema.  Her cardiac markers in the ER were negative.  Chest x-ray showed COPD with hyperinflation.  Blood gas showed problems with gas exchange with a pO2 of only 81 on 2 liters with a sat of 96.4.  Her cardiac risk factors include age, obesity, sedentary lifestyle, hypertension, hyperlipidemia, history of heavy tobacco use, and history of stroke.  Her past medical history is significant for the above comorbidities. She had a left hemispheric stroke in February 2005 felt secondary to an internal carotid artery dissection.  This was stented at the time by Dr. Corliss Skains.  She presented with some similar symptoms of stroke in March 2010.  She was transferred to [facility] where she had a left intracranial angiogram which showed no dissection.  It was felt she may have had a possible  transient ischemic intact.  At the time, she was smoking regularly.  Her medications on admission include: 1. Lorazepam 7.5 mg 3 times a day as needed 2. TriCor 145 mg one daily. 3. Hyzaar 100/25 one tablet daily. 4. Albuterol inhaler 2 puffs q.6 h p.r.n. 5. Albuterol nebulizer as needed. 6. Lortab 7.5/500 one tablet three times a day as needed. 7. Celexa 20 mg p.o. daily. 8. Nexium 40 mg daily  Other medical illnesses or problems include GERD and depression.  SOCIAL HISTORY:  She lives in [place].  She is divorced.  She has a female friend who is with her in the room.  She has 2 children.  REVIEW OF SYSTEMS:  Other than the HPI is negative.  PHYSICAL EXAMINATION:  GENERAL:  She is a pleasant lady who is coughing frequently in the room. VITAL SIGNS:  Her blood pressure is 120/76, her pulse is 84 and regular, respirations 22 and nonlabored.  Her sats 94%.  Temperature is 98.2. HEENT:  Sclerae are injected.  PERRLA.  Facial symmetry is normal. Dentition is in poor repair. NECK:  Supple.  Carotids upstrokes are equal bilaterally without obvious bruits.  Thyroid is not enlarged.  Trachea is midline.  LUNGS:  Reveal inspiratory and expiratory rhonchi with some crackles in the bases. HEART:  Reveals a difficult to appreciate PMI.  There is no obvious chest Eron Goble tenderness.  She has a soft systolic murmur with a normally split S2. ABDOMEN:  Obese with good bowel sounds.  No obvious tenderness.  No obvious midline bruit. EXTREMITIES:  Reveal no edema.  Pulses are present bilaterally.  No sign of DVT. NEUROLOGIC:  She is alert and oriented x3. PSYCHIATRIC:  Appropriate affect.  LABORATORY DATA:  Laboratory data including chest x-ray reviewed.  EKG showed sinus rhythm with some poor R-wave progression of the anterior precordium.  No acute changes.  ASSESSMENT:  Substernal chest discomfort mostly under the ribs bilaterally from coughing with a chronic obstructive pulmonary  disease flare.  This seems to be triggered by stress and one cigarette yesterday.  Her EKG shows no acute changes and her initial enzymes are negative.  Her symptoms have been improved with breathing therapy.  She is clearly at high risk for acute coronary syndrome and coronary artery disease, as I discussed with her considering her multiple risk factors including age, obesity, sedentary lifestyle, hypertension, history of dissected internal carotid artery, hyperlipidemia, and heavy tobacco use in the past.  PLAN:  Check cardiac enzymes.  If they are negative, she can be discharged home once her respiratory status is stable.  At this point, it is all secondary preventative therapy which I discussed with her at length today.  This includes lifestyle modifications including no more smoking, losing weight, exercising as much as possible, aspirin, antihypertensive therapy, and a statin.  All questions were answered.  Thank you for the consultation.     Leslieann Whisman C. Daleen Squibb, MD, [facility]     TCW/MEDQ  D:  09/09/2010  T:  09/10/2010  Job:  [id number]  Electronically Signed by Valera Castle MD [facility] on 09/16/2010 10:36:41 AM

## 2010-09-22 DIAGNOSIS — E669 Obesity, unspecified: Secondary | ICD-10-CM | POA: Insufficient documentation

## 2010-09-22 DIAGNOSIS — J449 Chronic obstructive pulmonary disease, unspecified: Secondary | ICD-10-CM | POA: Insufficient documentation

## 2010-09-22 DIAGNOSIS — J40 Bronchitis, not specified as acute or chronic: Secondary | ICD-10-CM | POA: Insufficient documentation

## 2010-09-22 DIAGNOSIS — I10 Essential (primary) hypertension: Secondary | ICD-10-CM

## 2010-09-22 DIAGNOSIS — R0789 Other chest pain: Secondary | ICD-10-CM | POA: Insufficient documentation

## 2010-09-22 DIAGNOSIS — E785 Hyperlipidemia, unspecified: Secondary | ICD-10-CM | POA: Insufficient documentation

## 2010-09-22 DIAGNOSIS — F17200 Nicotine dependence, unspecified, uncomplicated: Secondary | ICD-10-CM

## 2010-09-23 ENCOUNTER — Encounter: Payer: Self-pay | Admitting: *Deleted

## 2010-09-23 ENCOUNTER — Ambulatory Visit (INDEPENDENT_AMBULATORY_CARE_PROVIDER_SITE_OTHER): Payer: PRIVATE HEALTH INSURANCE | Admitting: Cardiology

## 2010-09-23 ENCOUNTER — Encounter: Payer: Self-pay | Admitting: Cardiology

## 2010-09-23 DIAGNOSIS — R0789 Other chest pain: Secondary | ICD-10-CM

## 2010-09-23 DIAGNOSIS — Z8673 Personal history of transient ischemic attack (TIA), and cerebral infarction without residual deficits: Secondary | ICD-10-CM | POA: Insufficient documentation

## 2010-09-23 DIAGNOSIS — R0602 Shortness of breath: Secondary | ICD-10-CM

## 2010-09-23 DIAGNOSIS — I10 Essential (primary) hypertension: Secondary | ICD-10-CM

## 2010-09-23 DIAGNOSIS — I639 Cerebral infarction, unspecified: Secondary | ICD-10-CM | POA: Insufficient documentation

## 2010-09-23 DIAGNOSIS — E785 Hyperlipidemia, unspecified: Secondary | ICD-10-CM

## 2010-09-23 DIAGNOSIS — R079 Chest pain, unspecified: Secondary | ICD-10-CM

## 2010-09-23 DIAGNOSIS — J449 Chronic obstructive pulmonary disease, unspecified: Secondary | ICD-10-CM

## 2010-09-23 DIAGNOSIS — I6350 Cerebral infarction due to unspecified occlusion or stenosis of unspecified cerebral artery: Secondary | ICD-10-CM

## 2010-09-23 MED ORDER — ROSUVASTATIN CALCIUM 20 MG PO TABS: 20.0000 mg | ORAL_TABLET | Freq: Every day | ORAL | Status: DC

## 2010-09-23 NOTE — Patient Instructions (Addendum)
Your physician recommends that you schedule a follow-up appointment in: 1 MONTH Your physician has recommended that you have a pulmonary function test. Pulmonary Function Tests are a group of tests that measure how well air moves in and out of your lungs.  Your physician has recommended you make the following change in your medication: INCREASE  ROSUVASTATIN TO 20MG  DAILY  Your physician discussed the importance of taking an antibiotic prior to any dental, gastrointestinal, genitourinary procedures to prevent damage to the heart valves from infection. You were given a prescription for an antibiotic based on current SBE prophylaxis guidelines.  Your physician has requested that you have en exercise stress myoview. For further information please visit [email or web address] Please follow instruction sheet, as given.  CALL DR. [name] IF DYSPNEA WORSENS Heart Murmur A heart murmur is an extra sound heard by your caregiver when listening to your heart with a device called a stethoscope. The sound might be a "hum" or "whoosh" sound heard when the heart beats. The sound comes from turbulence when blood flows through the heart. There are two types of heart murmurs:  Innocent (Harmless) murmurs: Most people with this type of heart murmur do not have signs or symptoms of heart problems. Many children have innocent heart murmurs. When an innocent heart murmur is found, there is no need to get tests or do treatment. Also, there is no need to restrict activities or stop playing sports. Innocent heart murmurs may be caused by many things. For example, it might be caused by a tiny hole or defect in the wall of the heart. These defects often close as a child grows. An innocent heart murmur may be heard by an examining clinician throughout your life. If you see a new caregiver, please let him or her know this was found during past exams.   Abnormal murmurs: May have signs and symptoms of heart problems. These types of  murmurs can occur in children and adults. In children, abnormal heart murmurs are typically caused from heart defects that are present at birth. In adults, abnormal murmurs are usually from heart valve problems caused by disease, infection, or aging.  SYMPTOMS  Innocent (Harmless) murmurs do not cause symptoms or require you to limit physical activity.   Many people with abnormal murmurs may or may not have symptoms. If symptoms do develop, they might include:   Shortness of breath.   Blue coloring of the skin, especially on the fingertips.   Chest pain.   Palpitations or feeling a "fluttering" or a "skipped" heart beat.   Fainting.   Persistent cough.   Getting tired much faster than expected.  DIAGNOSIS A heart murmur might be heard during a pre-sports physical or during any type of examination. When a murmur is heard, it may suggest a possible problem. When this happens, your caregiver may ask you to see a heart specialist (cardiologist). You may also be asked to undergo one or more heart tests. In these cases, testing may vary depending upon what your caregiver heard. Tests for a heart murmur might include one or more of the following:  EKG (electrocardiogram).   Echocardiogram.   Cardiac MRI.  For children and adults who have an abnormal heart murmur and want to play sports, it is important to complete testing, review test results, and receive recommendations from your caregiver. If heart disease is present, it may be risky to play. FINDING OUT THE RESULTS OF YOUR TEST Not all test results are available during your visit. If  your test results are not back during the visit, make an appointment with your caregiver to find out the results. Do not assume everything is normal if you have not heard from your caregiver or the medical facility. It is important for you to follow up on all of your test results.   TREATMENT As noted above, innocent (harmless) murmurs require no treatment or  activity restriction. If the murmur represents a problem with the heart, treatment will depend upon the exact nature of the problem. In these cases, medicine or surgery may be needed to treat the problem. HOME CARE If you want to participate in sports or other types of strenuous physical activity, it is important to discuss this first with your caregiver. If the murmur represents a problem with the heart and you choose to participate in sports, there is a small chance that a serious problem (including sudden death) could result.   SEEK MEDICAL CARE IF:  You feel that your symptoms are slowly worsening.   You develop any new symptoms that cause concern.   You feel that you are having side effects from any medicines prescribed.  SEEK IMMEDIATE MEDICAL CARE IF:  Chest pain develops.   You are short of breath.   You notice that your heart beats irregularly often enough to cause you to worry.   You have fainting spells.   There is a worsening of any problems that brought you or your child in for medical care.  Document Released: 04/09/2004 Document Re-Released: 12/10/2007 [facility] Patient Information 2011 [place], [place].

## 2010-09-23 NOTE — Progress Notes (Signed)
HPI : Patient was recently hospitalized with what was felt to be an exacerbation of chronic obstructive pulmonary disease.  She was experiencing intermittent episodes of localized left chest pressure of moderate severity coming intermittently with radiation to the left upper extremity.  No associated dyspnea but no diaphoresis nor nausea.  Since discharge, symptoms have been improved, but she continues to have some mild dyspnea and a nonproductive cough.  She denies orthopnea, PND, wheezing, fever, right worse and syncope.  An echocardiogram in hospital revealed normal regional and global LV systolic function.  Current Outpatient Prescriptions on File Prior to Visit  Medication Sig Dispense Refill  . albuterol (PROVENTIL,VENTOLIN) 90 MCG/ACT inhaler Inhale 2 puffs into the lungs every 6 (six) hours as needed.        Marland Kitchen aspirin 81 MG tablet Take 81 mg by mouth daily.        Marland Kitchen BLACK COHOSH PO Take by mouth.        . citalopram (CELEXA) 20 MG tablet Take 20 mg by mouth daily.        . clorazepate (TRANXENE) 7.5 MG tablet Take 7.5 mg by mouth 3 (three) times daily.        Marland Kitchen diltiazem (CARDIZEM CD) 180 MG 24 hr capsule Take 180 mg by mouth daily.        Marland Kitchen esomeprazole (NEXIUM) 40 MG capsule Take 40 mg by mouth daily before breakfast.        . fenofibrate (TRICOR) 145 MG tablet Take 145 mg by mouth daily.        . furosemide (LASIX) 20 MG tablet Take 20 mg by mouth daily.        Marland Kitchen HYDROcodone-acetaminophen (LORTAB) 7.5-500 MG per tablet Take 1 tablet by mouth every 6 (six) hours as needed.        . nitroGLYCERIN (NITROSTAT) 0.4 MG SL tablet Place 0.4 mg under the tongue every 5 (five) minutes as needed.        . predniSONE (DELTASONE) 10 MG tablet Take 10 mg by mouth 2 (two) times daily.        Marland Kitchen DISCONTD: rosuvastatin (CRESTOR) 10 MG tablet Take 10 mg by mouth daily.           No Known Allergies    Past medical history, social history, and family history reviewed and updated.  ROS: Requires  corrective lenses for near vision; partial upper and lower dentures; intermittent palpitations; told of cardiac murmur; gastroesophageal reflux disease symptoms; diffuse arthritic discomfort; intermittent pedal edema.  PHYSICAL EXAM: BP 157/81  Pulse 83  Ht 5\' 6"  (1.676 m)  Wt 101.152 kg (223 lb)  BMI 35.99 kg/m2  SpO2 95%  General-Well developed; no acute distress Body habitus-proportionate weight and height Neck-No JVD; no carotid bruits Lungs-mild rhonchi in the right lower lobe; prolonged expiratory phase; resonant to percussion Cardiovascular-normal PMI; normal S1 and S2 Abdomen-normal bowel sounds; soft and non-tender without masses or organomegaly Musculoskeletal-No deformities, no cyanosis or clubbing Neurologic-Normal cranial nerves; symmetric strength and tone Skin-Warm, no significant lesions Extremities-distal pulses intact; 1/2+ edema  EKG: Normal sinus rhythm; sinus arrhythmia; nondiagnostic inferior Q waves; left atrial abnormality; right ventricular conduction delay; delayed R wave progression-cannot exclude previous anteroseptal MI; no previous tracing for comparison.  ASSESSMENT AND PLAN:

## 2010-09-23 NOTE — Assessment & Plan Note (Signed)
Episodic chest tightness is certainly of concern in this woman with multiple cardiovascular risk factors but no known vascular disease.  In the absence of an exertional component, this is likely related to her chronic obstructive pulmonary disease and recent exacerbation.  She is substantially improved, but we will proceed with a stress nuclear study to rule out coronary disease.  I will see this nice woman again in 2 weeks to discuss the results of that study with her and to reassess her symptoms.

## 2010-09-23 NOTE — Assessment & Plan Note (Signed)
Recent lipid profile obtained in hospital was suboptimal.  Rosuvastatin dosage will be increased to 20 mg q.d. With a repeat lipid profile in one month.

## 2010-09-23 NOTE — Assessment & Plan Note (Signed)
Although patient denies previous pulmonary symptoms, I suspect that she has substantial chronic obstructive pulmonary disease.  She is worried about discontinuing prednisone, but is more willing to do so after having heard the potential adverse effects of chronic prednisone therapy.  She will call Dr. Georgia Dom if dyspnea worsens.  PFTs will be obtained after her episode of acute bronchitis has completely resolved.

## 2010-09-25 ENCOUNTER — Encounter: Payer: Self-pay | Admitting: Cardiology

## 2010-09-29 ENCOUNTER — Encounter: Payer: PRIVATE HEALTH INSURANCE | Admitting: *Deleted

## 2010-09-30 ENCOUNTER — Encounter: Payer: Self-pay | Admitting: *Deleted

## 2010-10-02 ENCOUNTER — Ambulatory Visit (HOSPITAL_COMMUNITY)
Admission: RE | Admit: 2010-10-02 | Discharge: 2010-10-02 | Disposition: A | Discharge status: Home / Self Care | Payer: PRIVATE HEALTH INSURANCE | Source: Ambulatory Visit | Attending: Cardiology | Admitting: Cardiology

## 2010-10-02 DIAGNOSIS — R0989 Other specified symptoms and signs involving the circulatory and respiratory systems: Secondary | ICD-10-CM | POA: Insufficient documentation

## 2010-10-02 DIAGNOSIS — R0609 Other forms of dyspnea: Secondary | ICD-10-CM | POA: Insufficient documentation

## 2010-10-02 LAB — BLOOD GAS, ARTERIAL
Bicarbonate: 27.7 mEq/L — ABNORMAL HIGH (ref 20.0–24.0)
Drawn by: 21179
FIO2: 0.21 %
O2 Saturation: 97.6 %
pCO2 arterial: 42.5 mmHg (ref 35.0–45.0)
pO2, Arterial: 90.1 mmHg (ref 80.0–100.0)

## 2010-10-02 LAB — PULMONARY FUNCTION TEST

## 2010-10-02 MED ORDER — ALBUTEROL SULFATE (2.5 MG/3ML) 0.083% IN NEBU
2.5000 mg | INHALATION_SOLUTION | Freq: Once | RESPIRATORY_TRACT | Status: AC
  Administered 2010-10-02: 2.5 mg via RESPIRATORY_TRACT

## 2010-10-02 NOTE — Discharge Summary (Signed)
[name], [name]             ACCOUNT NO.:  [id number]  MEDICAL RECORD NO.:  [id number]  LOCATION:  A312                          FACILITY:  [facility]  PHYSICIAN:  [name]. [name], M.D.DATE OF BIRTH:  05-Oct-1950  DATE OF ADMISSION:  09/09/2010 DATE OF DISCHARGE:  06/29/2012LH                              DISCHARGE SUMMARY   This 60 year old was admitted to the hospital with bronchitis and COPD. She had a benign 4-day hospitalization extending from September 09, 2010 to September 12, 2010.  Admission white cell count was 9800, of which 48% neutrophils, 36 lymphs.  She had a platelet count of 467,000, hemoglobin 14.3.  BMP showed a potassium 3.4, glucose 107.  Arterial blood gases on 2 liters showed pH 7.44, pCO2 38, pO2 of 81 and bicarbonate 25.  Her CK-MB was 3.6 and total creatinine kinase 160 with a troponin of less than 0.30 and cardiac panel later showing a CK of 4.7, but the creatinine kinase total was only 100.  Lipid profile showed a cholesterol 159, triglycerides 92, HDL 40, LDL 101.  Her admission chest x-ray showed mild pulmonary hyperinflation consistent with COPD.  Her cardiac echo showed ejection fraction of 75-80% and grade 1 diastolic dysfunction.  She had mild LVH.  Admission eighth EKG showed a normal sinus rhythm and "cannot rule out anterior infarct, age undetermined."  Rechecked EKG showed a mild sinus tachycardia rate 106 and also "cannot rule out anterior infarct, age undetermined"  She was admitted to the hospital.  She was started on Rocephin a gram IV daily and Zithromax 500 mg IV daily with Solu-Medrol 125 mg IV q.6 h, DuoNeb's every 4 hours while awake with an IV rate at 50 mL/hour as well as Hyzaar 100/25 daily, Lipitor 40 mg daily, omeprazole 20 mg daily, Lasix 40 mg daily, trazodone 75 mg at bedtime, and DVT prophylaxis with Lovenox.  She is on continuous pulse oximetry to maintain her O2 greater than 90%.  She also received alprazolam 1 mg t.i.d.  and Tylenol 650 q.4 h p.r.n. She had ordered nitroglycerin 0.4 mg sublingually but it was not used. She received Tussionex teaspoon t.i.d. for coughing and Vicodin 5/500 q.4 h for pain.  By the third day Solu-Medrol was discontinued and she was put on prednisone 20 mg daily.  She was switched to Hep-Lock.  During the hospitalization she had had chest pressure and heaviness which was ongoing.  She was seen by cardiology.  There was no substantial data to indicate that she had angina pectoris causing these symptoms.  Medications were changed because of the results of the echo but cardiac workup was not pursued.  The patient understood that if she had further symptoms that she would have full cardiac workup done outpatient.  Her third day the Hyzaar was also discontinued and her Cardizem was decreased 180 mg daily and Lasix to 20 mg daily.  Her fourth day she was feeling much better.  She still had very, very slight chest heaviness all the time.  She is breathing well, however. Color was good.  She was weak but much improved.  It was felt that she had reached maximum benefit of hospitalization and was ready for discharge  home.  She is discharged home on: 1. Aspirin 81 mg daily. 2. Ceftin 500 mg b.i.d. (20 with no refills). 3. Crestor 10 mg daily (30 with 11 refills). 4. Diltiazem 180 mg daily (30 with 11 refills). 5. Furosemide 20 mg daily (30 with 11 refills). 6. Tussionex teaspoon t.i.d. (8 ounces with no refills). 7. Nitroglycerin 0.4 mg as directed (#100, no refills). 8. Prednisone 10 mg b.i.d. (20 with no refills). 9. Zithromax 500 mg daily for 5 days (#5 with no refills). 10.She is to continue home meds of albuterol inhaler, albuterol by     nebulizer, Black Cohosh root extract, citalopram 20 mg daily,     clorazepate 7.5 mg t.i.d., Lortab 7.5/500 t.i.d. p.r.n., Nexium 40     mg daily and TriCor 145 mg daily. 11.She was to DC Hyzaar.  FOLLOWUP:  Was to be with me within  the week.  I discussed all this the patient and her significant other.     [name]. [name], M.D.     SDK/MEDQ  D:  09/12/2010  T:  09/12/2010  Job:  [id number]  Electronically Signed by John Giovanni M.D. on 10/02/2010 04:39:21 PM

## 2010-10-02 NOTE — Progress Notes (Signed)
  [name], [name]             ACCOUNT NO.:  [id number]  MEDICAL RECORD NO.:  [id number]  LOCATION:  A312                          FACILITY:  [facility]  PHYSICIAN:  [name]. [name], M.D.DATE OF BIRTH:  Aug 03, 1950  DATE OF PROCEDURE:  09/11/2010 DATE OF DISCHARGE:                                PROGRESS NOTE   SUBJECTIVE:  [name] feels much better.  [name] has had no further anterior chest pressure or left arm pain.  [name] still has pain bilaterally in her rib area where the flank meets the lateral ribs, particularly with coughing.  OBJECTIVE:  VITAL SIGNS:  Temperature 98.1, pulse 97, respiratory rate 20, blood pressure 189/93 but was 112/60 just a few hours before.  Her O2 sat is 99% room air. GENERAL:  [name] is well developed, well nourished, oriented and alert.  No acute distress today.  Color is good. LUNGS:  Clear throughout.  [name] is moving air well without intercostal retraction or use of accessory muscles of respiration. HEART:  Regular rhythm with rate about 70.  Yesterday, the cardiac enzymes were normal.  Her cardiac echo showed mild LVH with an ejection fraction of 75%-80% and grade 1 diastolic dysfunction.  The echo was not able to assess wall motion abnormality of the left ventricle, however.  ASSESSMENT: 1. Chronic obstructive pulmonary disease exacerbation. 2. History of left carotid artery dissection, now 7 years ago. 3. Possible angina pectoris. 4. Tobacco use disorder.  PLAN:  DC Solu-Medrol and switch to prednisone 20 mg daily.  I have switched her IV to Hep-Lock.  If [name] continues to do well tomorrow, [name] will be discharged.  [name] will have further cardiac workup if necessary outpatient.     [name]. [name], M.D.     SDK/MEDQ  D:  09/11/2010  T:  09/11/2010  Job:  [id number]  Electronically Signed by John Giovanni M.D. on 10/02/2010 04:39:55 PM

## 2010-10-02 NOTE — H&P (Signed)
[name], [name]             ACCOUNT NO.:  [id number]  MEDICAL RECORD NO.:  [id number]  LOCATION:  A312                          FACILITY:  [facility]  PHYSICIAN:  [name]. [name], M.D.DATE OF BIRTH:  01/25/1951  DATE OF ADMISSION:  09/09/2010 DATE OF DISCHARGE:  LH                             HISTORY & PHYSICAL   This 60 year old woman [place] [address] when she developed shortness of breath and coughing, which apparently had been going on for some time but got worse at that point.  She came to the hospital for evaluation.  In addition to short depression, also some chest tightness, but she is concerned about giving her past history.  In February 2005, she had a carotid artery dissection and had internal carotid artery stent placed by Dr. Corliss Skains for this left internal carotid artery dissection.  She is a long-time smoker.  She finally 2 years ago but then resumed smoking about 3 weeks ago.  She had been coughing but had no sputum production.  She actually improved with treatment in the emergency department.  She is breathing much better but still has a chest heaviness despite all treatments.  Her last medications of record include: 1. Lipitor 40 mg daily. 2. Hyzaar 100/25 daily. 3. Lasix 40 mg daily. 4. Nexium 40 mg daily 5. Aspirin 81 mg daily. 6. Tranxene at bedtime. 7. Albuterol by inhaler.  She denies GI or GU problems.  She had no further symptoms of stroke or carotid dissection which she had years before.  PHYSICAL EXAMINATION:  VITAL SIGNS:  Temperature 98.2, pulse of 84, respiratory rate 20, blood pressure 120/76.  O2 saturation was 94% on room air. GENERAL:  At the time I saw her, she had some puffiness of her cheeks. Her speech was normal.  Color was good.  She is in no acute distress. She is breathing easily. LUNGS:  Actually are fairly clear throughout but somewhat decreased breath sounds.  There are no intercostal  retractions or use of accessory muscles of respiration. HEART:  Regular rhythm, rate of about 70. ABDOMEN:  Soft without organomegaly, mass or tenderness. EXTREMITIES:  She had warm feet, 2+ DP pulses.  Admission chest x-ray showed mild pulmonary hyperinflation felt to be consistent with COPD.  Admission white cell count was 9800 of which 48% were neutrophils, 36 lymphs, hemoglobin 14.3, platelet count 465,000.  BMP showed potassium 3.4, glucose 107.  CK-MB was normal as was the troponin-I.  ABGs on 2 liters  showed pH 7.43, pCO2 of 38, pO2 of 81.  ADMISSION DIAGNOSES: 1. Chronic obstructive pulmonary disease exacerbation. 2. Tobacco use disorder. 3. Chest fullness and tightness which could well be secondary to     coronary artery disease. 4. Status post a left carotid artery dissection, now also status post     stenting of this. 5. Obesity. 6. Hypercholesterolemia. 7. Hypertension.  PLAN:  She will be treated with IV antibiotics and steroids for this COPD.  I have asked Dr. Juanito Doom of [facility] to see her, and he and I discussed her case.  She has serial enzymes pending.  If her symptoms clear and serum enzymes  are negative, there will be no further workup at least at this time for cardiac etiology.  Otherwise, if she does seem to have angina pectoris, we will need to transfer to [facility] for further workup.     [name]. [name], M.D.     SDK/MEDQ  D:  09/09/2010  T:  09/10/2010  Job:  [id number]  Electronically Signed by John Giovanni M.D. on 10/02/2010 04:39:38 PM

## 2010-10-02 NOTE — Progress Notes (Signed)
  NAMEASUNCION, [name]             ACCOUNT NO.:  [id number]  MEDICAL RECORD NO.:  [id number]  LOCATION:  A312                          FACILITY:  [facility]  PHYSICIAN:  [name]. [name], M.D.DATE OF BIRTH:  [birth year]  DATE OF PROCEDURE: DATE OF DISCHARGE:                                PROGRESS NOTE   SUBJECTIVE:  This 60 year old was admitted to the hospital last night with COPD exacerbation, but also with chest pressure which was suggestive of angina pectoris.  Her breathing did pretty well last night but at least several occasions she had a dull heavy pain over the left chest along with some left arm pain as well.  This really did not clear readily.  OBJECTIVE:  This morning but she is sitting in bed.  Color is good.  She is on a O2 mask, however.  She is in no acute distress, well-developed and obese.  Her lungs show decreased breath sounds throughout but she is moving air well.  There are no intercostal retractions.  No use of accessory muscles of respiration.  Heart has regular rhythm, rate of about 90.  Vital signs show temperature 98.1, pulse 97, respiratory 18, blood pressure 127/79.  Her O2 sat is 92% on 2 liters.  ASSESSMENT: 1. Chronic obstructive pulmonary disease exacerbation. 2. History of left carotid artery dissection 7 years ago. 3. Tobacco use disorder with many years of smoking in the past. 4. Angina pectoris.  PLAN:  I have instructed to let nursing know whenever she is getting chest pain again and we will put on nitroglycerin.  Cardiac enzymes and EKG are pending.     [name]. [name], M.D.     SDK/MEDQ  D:  09/10/2010  T:  09/10/2010  Job:  [id number]  Electronically Signed by John Giovanni M.D. on 10/02/2010 04:39:45 PM

## 2010-10-03 ENCOUNTER — Encounter: Payer: Self-pay | Admitting: Cardiology

## 2010-10-03 ENCOUNTER — Ambulatory Visit: Payer: PRIVATE HEALTH INSURANCE | Admitting: Adult Health

## 2010-10-03 NOTE — Procedures (Signed)
NAMEBEKAH, IGOE             ACCOUNT NO.:  [id number]  MEDICAL RECORD NO.:  [id number]  LOCATION:                                 FACILITY:  PHYSICIAN:  Ronan Dion L. Juanetta Gosling, M.D.DATE OF BIRTH:  1950-06-26  DATE OF PROCEDURE: DATE OF DISCHARGE:                           PULMONARY FUNCTION TEST   Reason for pulmonary function testing is COPD. 1. Spirometry shows a mild ventilatory defect with evidence of airflow     obstruction. 2. Lung volumes are normal. 3. DLCO is moderately reduced. 4. Arterial blood gas is normal. 5. There is no significant bronchodilator improvement. 6. This study is consistent with the clinical diagnosis of COPD.     Eric Nees L. Juanetta Gosling, M.D.     ELH/MEDQ  D:  10/02/2010  T:  10/02/2010  Job:  [id number]  cc:   Gerrit Friends. Dietrich Pates, MD, [facility] [address] [place], [place] [address]

## 2010-10-05 ENCOUNTER — Encounter: Payer: Self-pay | Admitting: *Deleted

## 2010-10-14 ENCOUNTER — Telehealth: Payer: Self-pay | Admitting: *Deleted

## 2010-10-14 NOTE — Telephone Encounter (Signed)
Results given to pt, verbalized understanding

## 2010-10-24 ENCOUNTER — Ambulatory Visit: Payer: PRIVATE HEALTH INSURANCE | Admitting: Cardiology

## 2010-11-19 ENCOUNTER — Telehealth: Payer: Self-pay | Admitting: Cardiology

## 2010-11-19 NOTE — Telephone Encounter (Signed)
Patient would like for you to mail her a copy of her PFT's / tg

## 2010-11-20 ENCOUNTER — Encounter (HOSPITAL_COMMUNITY): Payer: Self-pay | Admitting: Cardiology

## 2010-12-12 ENCOUNTER — Inpatient Hospital Stay (HOSPITAL_COMMUNITY)
Admission: EM | Admit: 2010-12-12 | Discharge: 2010-12-13 | DRG: 204 | Disposition: A | Discharge status: Home / Self Care | Payer: PRIVATE HEALTH INSURANCE | Attending: Family Medicine | Admitting: Family Medicine

## 2010-12-12 ENCOUNTER — Emergency Department (HOSPITAL_COMMUNITY): Payer: PRIVATE HEALTH INSURANCE

## 2010-12-12 ENCOUNTER — Other Ambulatory Visit: Payer: Self-pay

## 2010-12-12 ENCOUNTER — Encounter (HOSPITAL_COMMUNITY): Payer: Self-pay

## 2010-12-12 DIAGNOSIS — E7800 Pure hypercholesterolemia, unspecified: Secondary | ICD-10-CM | POA: Diagnosis present

## 2010-12-12 DIAGNOSIS — F3289 Other specified depressive episodes: Secondary | ICD-10-CM | POA: Diagnosis present

## 2010-12-12 DIAGNOSIS — N751 Abscess of Bartholin's gland: Secondary | ICD-10-CM | POA: Diagnosis present

## 2010-12-12 DIAGNOSIS — J309 Allergic rhinitis, unspecified: Secondary | ICD-10-CM | POA: Diagnosis present

## 2010-12-12 DIAGNOSIS — J4489 Other specified chronic obstructive pulmonary disease: Secondary | ICD-10-CM | POA: Diagnosis present

## 2010-12-12 DIAGNOSIS — R071 Chest pain on breathing: Principal | ICD-10-CM | POA: Diagnosis present

## 2010-12-12 DIAGNOSIS — Z87891 Personal history of nicotine dependence: Secondary | ICD-10-CM

## 2010-12-12 DIAGNOSIS — F329 Major depressive disorder, single episode, unspecified: Secondary | ICD-10-CM | POA: Diagnosis present

## 2010-12-12 DIAGNOSIS — I10 Essential (primary) hypertension: Secondary | ICD-10-CM | POA: Diagnosis present

## 2010-12-12 DIAGNOSIS — J449 Chronic obstructive pulmonary disease, unspecified: Secondary | ICD-10-CM | POA: Diagnosis present

## 2010-12-12 DIAGNOSIS — E669 Obesity, unspecified: Secondary | ICD-10-CM | POA: Diagnosis present

## 2010-12-12 LAB — DIFFERENTIAL
Eosinophils Relative: 0 % (ref 0–5)
Lymphocytes Relative: 16 % (ref 12–46)
Lymphs Abs: 1.8 10*3/uL (ref 0.7–4.0)

## 2010-12-12 LAB — CBC
MCV: 88.6 fL (ref 78.0–100.0)
Platelets: 389 10*3/uL (ref 150–400)
RBC: 4.49 MIL/uL (ref 3.87–5.11)
WBC: 11.2 10*3/uL — ABNORMAL HIGH (ref 4.0–10.5)

## 2010-12-12 LAB — PRO B NATRIURETIC PEPTIDE: Pro B Natriuretic peptide (BNP): 176.5 pg/mL — ABNORMAL HIGH (ref 0–125)

## 2010-12-12 LAB — BASIC METABOLIC PANEL
CO2: 29 mEq/L (ref 19–32)
Calcium: 9.8 mg/dL (ref 8.4–10.5)
Glucose, Bld: 204 mg/dL — ABNORMAL HIGH (ref 70–99)
Potassium: 3.5 mEq/L (ref 3.5–5.1)
Sodium: 141 mEq/L (ref 135–145)

## 2010-12-12 LAB — CARDIAC PANEL(CRET KIN+CKTOT+MB+TROPI)
CK, MB: 2.8 ng/mL (ref 0.3–4.0)
Relative Index: INVALID (ref 0.0–2.5)
Troponin I: <0.3 ng/mL (ref <0.30)

## 2010-12-12 LAB — PROTIME-INR: Prothrombin Time: 13.5 seconds (ref 11.6–15.2)

## 2010-12-12 MED ORDER — ENOXAPARIN SODIUM 40 MG/0.4ML ~~LOC~~ SOLN
40.0000 mg | SUBCUTANEOUS | Status: DC
  Administered 2010-12-12: 40 mg via SUBCUTANEOUS
  Filled 2010-12-12: qty 0.4

## 2010-12-12 MED ORDER — DIAZEPAM 5 MG PO TABS
5.0000 mg | ORAL_TABLET | Freq: Three times a day (TID) | ORAL | Status: DC
  Administered 2010-12-12 – 2010-12-13 (×2): 5 mg via ORAL
  Filled 2010-12-12 (×2): qty 1

## 2010-12-12 MED ORDER — NITROGLYCERIN 0.4 MG SL SUBL: 0.4000 mg | SUBLINGUAL_TABLET | SUBLINGUAL | Status: DC | PRN

## 2010-12-12 MED ORDER — ALBUTEROL SULFATE (5 MG/ML) 0.5% IN NEBU
2.5000 mg | INHALATION_SOLUTION | Freq: Four times a day (QID) | RESPIRATORY_TRACT | Status: DC
  Administered 2010-12-12: 2.5 mg via RESPIRATORY_TRACT
  Filled 2010-12-12: qty 0.5

## 2010-12-12 MED ORDER — PANTOPRAZOLE SODIUM 40 MG PO TBEC
80.0000 mg | DELAYED_RELEASE_TABLET | Freq: Every day | ORAL | Status: DC
  Administered 2010-12-12 – 2010-12-13 (×2): 80 mg via ORAL
  Filled 2010-12-12 (×2): qty 2

## 2010-12-12 MED ORDER — FENOFIBRATE 160 MG PO TABS
160.0000 mg | ORAL_TABLET | Freq: Every day | ORAL | Status: DC
  Administered 2010-12-13: 160 mg via ORAL
  Filled 2010-12-12 (×3): qty 1

## 2010-12-12 MED ORDER — HYDROCODONE-ACETAMINOPHEN 5-325 MG PO TABS
1.5000 | ORAL_TABLET | ORAL | Status: DC | PRN
  Administered 2010-12-12 – 2010-12-13 (×4): 1.5 via ORAL
  Filled 2010-12-12 (×4): qty 2

## 2010-12-12 MED ORDER — FUROSEMIDE 20 MG PO TABS
20.0000 mg | ORAL_TABLET | Freq: Every day | ORAL | Status: DC
  Administered 2010-12-13: 20 mg via ORAL
  Filled 2010-12-12: qty 1

## 2010-12-12 MED ORDER — ALBUTEROL SULFATE HFA 108 (90 BASE) MCG/ACT IN AERS: 2.0000 | INHALATION_SPRAY | RESPIRATORY_TRACT | Status: DC | PRN

## 2010-12-12 MED ORDER — ASPIRIN 81 MG PO CHEW
81.0000 mg | CHEWABLE_TABLET | Freq: Every day | ORAL | Status: DC
  Administered 2010-12-13: 81 mg via ORAL
  Filled 2010-12-12: qty 1

## 2010-12-12 MED ORDER — MORPHINE SULFATE 4 MG/ML IJ SOLN
4.0000 mg | Freq: Once | INTRAMUSCULAR | Status: AC
  Administered 2010-12-12: 4 mg via INTRAVENOUS
  Filled 2010-12-12 (×2): qty 1

## 2010-12-12 MED ORDER — PREDNISONE 10 MG PO TABS
10.0000 mg | ORAL_TABLET | Freq: Every day | ORAL | Status: DC
  Administered 2010-12-13: 10 mg via ORAL
  Filled 2010-12-12: qty 1

## 2010-12-12 MED ORDER — SODIUM CHLORIDE 0.9 % IV SOLN
INTRAVENOUS | Status: DC
  Administered 2010-12-12: 1000 mL via INTRAVENOUS

## 2010-12-12 MED ORDER — NITROGLYCERIN 0.4 MG SL SUBL
0.4000 mg | SUBLINGUAL_TABLET | Freq: Once | SUBLINGUAL | Status: AC
  Administered 2010-12-12: 0.4 mg via SUBLINGUAL
  Filled 2010-12-12: qty 25

## 2010-12-12 MED ORDER — CITALOPRAM HYDROBROMIDE 20 MG PO TABS
20.0000 mg | ORAL_TABLET | Freq: Every day | ORAL | Status: DC
  Administered 2010-12-13: 20 mg via ORAL
  Filled 2010-12-12: qty 1

## 2010-12-12 MED ORDER — NITROGLYCERIN 0.4 MG SL SUBL
0.4000 mg | SUBLINGUAL_TABLET | Freq: Once | SUBLINGUAL | Status: AC
  Administered 2010-12-12: 0.4 mg via SUBLINGUAL

## 2010-12-12 MED ORDER — ROSUVASTATIN CALCIUM 20 MG PO TABS
20.0000 mg | ORAL_TABLET | Freq: Every day | ORAL | Status: DC
  Administered 2010-12-12: 20 mg via ORAL
  Filled 2010-12-12: qty 1

## 2010-12-12 MED ORDER — DILTIAZEM HCL ER COATED BEADS 180 MG PO CP24
180.0000 mg | ORAL_CAPSULE | Freq: Every day | ORAL | Status: DC
  Administered 2010-12-13: 180 mg via ORAL
  Filled 2010-12-12: qty 1

## 2010-12-12 MED ORDER — ALBUTEROL SULFATE (5 MG/ML) 0.5% IN NEBU: 2.5000 mg | INHALATION_SOLUTION | RESPIRATORY_TRACT | Status: DC | PRN

## 2010-12-12 NOTE — ED Notes (Signed)
Pt had  1SL nitro at home today. 1 En route with EMS. EMS also gave 324mg  ASA.

## 2010-12-12 NOTE — ED Provider Notes (Addendum)
Scribed for Dayton Bailiff, MD, the patient was seen in room APA01/APA01. This chart was scribed by [employer]. The patient's care started at 17:16 CSN: [id number] Arrival date & time: 12/12/2010  4:49 PM  Chief Complaint  Patient presents with  . Chest Pain  . Shortness of Breath   HPI [name] is a 60 y.o. female with a history of COPD who presents to the Emergency Department complaining of intermittent LUQ chest pain with associated shortness of breath. Describes chest pain as "a heavy pain" that's aggravated with movement. Pain was alleviated when she took nitro at home. States that Shortness of breath is unlike her history of COPD. Patient also complains of a "spot" on her vagina. Denies a history of stress test. PCP is Dr [name].  Past Medical History  Diagnosis Date  . COPD (chronic obstructive pulmonary disease)   . Hypertension   . Hyperlipidemia   . Obesity   . Chest tightness   . Tobacco use disorder     50 pack years; questionably discontinued in 2010  . 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 Splenectomy   . Colonoscopy 2011  . Abdominal hysterectomy   . Cholecystectomy   . Breast excisional biopsy     Left  . Larynx surgery     Polyps excised  . Laparotomy     No family history on file.  History  Substance Use Topics  . Smoking status: Former Smoker -- 2.0 packs/day for 40 years    Types: Cigarettes  . Smokeless tobacco: Never Used  . Alcohol Use: No    OB History    Grav Para Term Preterm Abortions TAB SAB Ect Mult Living                  Review of Systems  All other systems reviewed and are negative.    Allergies  Tape  Home Medications   Current Outpatient Rx  Name  Route Sig Dispense Refill  . ALBUTEROL SULFATE (2.5 MG/3ML) 0.083% IN NEBU Nebulization Take 2.5 mg by nebulization every 4 (four) hours as needed. For shortness of breath     . ALBUTEROL 90 MCG/ACT IN AERS Inhalation Inhale 2 puffs into the lungs every 4 (four) hours as needed. For shortness of breath    . ASPIRIN 81 MG PO TABS Oral Take 81 mg by mouth daily.      Marland Kitchen BLACK COHOSH PO Oral Take 1 tablet by mouth daily.     Marland Kitchen CITALOPRAM HYDROBROMIDE 20 MG PO TABS Oral Take 20 mg by mouth daily.      Marland Kitchen CLORAZEPATE DIPOTASSIUM 7.5 MG PO TABS Oral Take 7.5 mg by mouth 3 (three) times daily.      Marland Kitchen DILTIAZEM HCL COATED BEADS 180 MG PO CP24 Oral Take 180 mg by mouth daily.      Marland Kitchen ESOMEPRAZOLE MAGNESIUM 40 MG PO CPDR Oral Take 40 mg by mouth daily before breakfast.      . FENOFIBRATE 145 MG PO TABS Oral Take 145 mg by mouth daily.      . FUROSEMIDE 20 MG PO TABS Oral Take 20 mg by mouth daily.      Marland Kitchen HYDROCODONE-ACETAMINOPHEN 7.5-500 MG PO TABS Oral Take 1 tablet by mouth every 8 (  eight) hours as needed. For pain    . LORATADINE 10 MG PO TABS Oral Take 10 mg by mouth daily.      Marland Kitchen NITROGLYCERIN 0.4 MG SL SUBL Sublingual Place 0.4 mg under the tongue every 5 (five) minutes as needed.      Marland Kitchen PREDNISONE 10 MG PO TABS Oral Take 10 mg by mouth daily.     Marland Kitchen ROSUVASTATIN CALCIUM 20 MG PO TABS Oral Take 1 tablet (20 mg total) by mouth daily. 30 tablet 6    BP 132/83  Pulse 87  Temp(Src) 98.2 F (36.8 C) (Oral)  Resp 24  Ht 5\' 6"  (1.676 m)  Wt 200 lb (90.719 kg)  BMI 32.28 kg/m2  SpO2 96%  Physical Exam  Nursing note and vitals reviewed. Constitutional: She appears well-developed.       Awake, alert, nontoxic appearance with baseline speech for patient.  HENT:  Head: Atraumatic.  Mouth/Throat: No oropharyngeal exudate.  Eyes: EOM are normal. Pupils are equal, round, and reactive to light. Right eye exhibits no discharge. Left eye exhibits no discharge.  Neck: Neck supple.  Cardiovascular: Normal  rate, regular rhythm and normal heart sounds.   No murmur heard. Pulmonary/Chest: Effort normal and breath sounds normal. No stridor. No respiratory distress. She has no wheezes. She has no rales. She exhibits tenderness (LUQ).  Abdominal: Soft. Bowel sounds are normal. She exhibits no mass. There is no tenderness. There is no rebound.  Genitourinary:       Evidence of Yeast infection (Chaperone present). Small cyst present on the right labia. There is no associated cellulitis or fluctuance. There is no indication for drainage at this time  Musculoskeletal: She exhibits no tenderness.       Baseline ROM, moves extremities with no obvious new focal weakness.  Lymphadenopathy:    She has no cervical adenopathy.  Neurological:       Awake, alert, cooperative and aware of situation;  Skin: Skin is warm and dry. No rash noted. No erythema.  Psychiatric: She has a normal mood and affect.    ED Course  Procedures  OTHER DATA REVIEWED: Nursing notes, vital signs, and past medical records reviewed.    DIAGNOSTIC STUDIES: Oxygen Saturation is 96% on room air, normal by my interpretation.    LABS / RADIOLOGY:   Results for orders placed during the hospital encounter of 12/12/10  CBC      Component Value Range   WBC 11.2 (*) 4.0 - 10.5 (K/uL)   RBC 4.49  3.87 - 5.11 (MIL/uL)   Hemoglobin 13.2  12.0 - 15.0 (g/dL)   HCT 16.1  09.6 - 04.5 (%)   MCV 88.6  78.0 - 100.0 (fL)   MCH 29.4  26.0 - 34.0 (pg)   MCHC 33.2  30.0 - 36.0 (g/dL)   RDW 40.9  81.1 - 91.4 (%)   Platelets 389  150 - 400 (K/uL)  DIFFERENTIAL      Component Value Range   Neutrophils Relative 77  43 - 77 (%)   Neutro Abs 8.6 (*) 1.7 - 7.7 (K/uL)   Lymphocytes Relative 16  12 - 46 (%)   Lymphs Abs 1.8  0.7 - 4.0 (K/uL)   Monocytes Relative 6  3 - 12 (%)   Monocytes Absolute 0.6  0.1 - 1.0 (K/uL)   Eosinophils Relative 0  0 - 5 (%)   Eosinophils Absolute 0.0  0.0 - 0.7 (K/uL)   Basophils Relative 1  0 - 1 (%)  Basophils Absolute 0.1  0.0 - 0.1 (K/uL)  BASIC METABOLIC PANEL      Component Value Range   Sodium 141  135 - 145 (mEq/L)   Potassium 3.5  3.5 - 5.1 (mEq/L)   Chloride 102  96 - 112 (mEq/L)   CO2 29  19 - 32 (mEq/L)   Glucose, Bld 204 (*) 70 - 99 (mg/dL)   BUN 11  6 - 23 (mg/dL)   Creatinine, Ser 1.61  0.50 - 1.10 (mg/dL)   Calcium 9.8  8.4 - 09.6 (mg/dL)   GFR calc non Af Amer >60  >60 (mL/min)   GFR calc Af Amer >60  >60 (mL/min)  CARDIAC PANEL(CRET KIN+CKTOT+MB+TROPI)      Component Value Range   Total CK 69  7 - 177 (U/L)   CK, MB 2.8  0.3 - 4.0 (ng/mL)   Troponin I <0.30  <0.30 (ng/mL)   Relative Index RELATIVE INDEX IS INVALID  0.0 - 2.5   PRO B NATRIURETIC PEPTIDE      Component Value Range   BNP, POC 176.5 (*) 0 - 125 (pg/mL)  PROTIME-INR      Component Value Range   Prothrombin Time 13.5  11.6 - 15.2 (seconds)   INR 1.01  0.00 - 1.49     Dg Chest Portable 1 View  12/12/2010  *RADIOLOGY REPORT*  Clinical Data: Chest pain.  Shortness of breath.  PORTABLE CHEST - 1 VIEW  Comparison: 09/09/2010  Findings: Sensitivities mildly reduced by motion artifact.  Cardiac and mediastinal contours appear normal.  The lungs appear clear.  No pleural effusion is identified.  IMPRESSION:  No significant abnormality identified.  Original Report Authenticated By: Dellia Cloud, M.D.     ED COURSE / COORDINATION OF CARE: 17:25 - EDMD at examined patient at bedside and ordered the following   Date: 12/12/2010  Rate: 85  Rhythm: normal sinus rhythm  QRS Axis: normal  Intervals: normal  ST/T Wave abnormalities: normal  Conduction Disutrbances:none  Narrative Interpretation:   Old EKG Reviewed: unchanged  MDM: Patient is a heart score of greater than 4. She has multiple risk factors including hypertension and hyperlipidemia as well as obesity. At her last admission she did not followup to obtain a stress test. Her pain is left-sided although atypical. Nitroglycerin did help her  pain at one time. She received 3 nitroglycerin prior to arrival still has pain. She received aspirin on EMS. Labs were performed for a cardiac evaluation were unremarkable. Chest x-ray was negative. Oxygen was placed as well as a morphine dose administered. I feel the patient requires admission for further evaluation of her chest pain. I spoke with the patient's primary care physician Dr. Renard Matter who is covering for Dr. [name]. She'll be admitted to a medical surgical floor she will receive serial troponins and be set up for an outpatient stress  IMPRESSION: Diagnoses that have been ruled out:  Diagnoses that are still under consideration:  Final diagnoses:  Chest pain    PLAN:  Admitted to [facility]. The case was discussed with the admitting physician Dr Renard Matter The patient is to return the emergency department if there is any worsening of symptoms. I have reviewed the discharge instructions with the patient   MEDICATIONS GIVEN IN THE E.D.  Medications  albuterol (PROVENTIL) (2.5 MG/3ML) 0.083% nebulizer solution (not administered)  loratadine (CLARITIN) 10 MG tablet (not administered)  morphine 4 MG/ML injection 4 mg (not administered)  nitroGLYCERIN (NITROSTAT) SL tablet 0.4 mg (0.4  mg Sublingual Given 12/12/10 1651)  nitroGLYCERIN (NITROSTAT) SL tablet 0.4 mg (0.4 mg Sublingual Given 12/12/10 1735)    DISCHARGE MEDICATIONS: New Prescriptions   No medications on file    SCRIBE ATTESTATION: I personally performed the services described in this documentation, which was scribed in my presence. The recorded information has been reviewed and considered.     Dayton Bailiff, MD 12/12/10 1805  Dayton Bailiff, MD 12/12/10 [phone]

## 2010-12-12 NOTE — ED Notes (Signed)
Spoke with Lawson Fiscal on 300. Report attempted to give to [facility] on floor and unable to take report at this time,.

## 2010-12-12 NOTE — ED Notes (Signed)
Pt started with CP yesterday and took 2 SL nitro with relief. Pt had episode again today while in shower. Pt received 1 SL nitro by EMS. Pt describes pain as pressure and worse with movement. Pt a/o x3.

## 2010-12-12 NOTE — H&P (Signed)
NAMEANAPAULA, SEVERT             ACCOUNT NO.:  [id number]  MEDICAL RECORD NO.:  [id number]  LOCATION:  A325                          FACILITY:  [facility]  PHYSICIAN:  Isaack Preble G. Kristiann Noyce, MD   DATE OF BIRTH:  12-04-1950  DATE OF ADMISSION:  12/12/2010 DATE OF DISCHARGE:  LH                             HISTORY & PHYSICAL   This patient is a 60 year old white female who at home developed left anterior chest pain with associated shortness of breath and diaphoresis and also stated that she had a soreness in her left arm, left side of her chest, which was aggravated by movement.  She did take nitroglycerin at home, which relieved her symptoms somewhat.  She does have COPD and has had previous cardiac evaluation by  [facility].  Apparently, no nuclear stress test has been done.  She states that it was considered at one point on the previous hospitalization.  She was seen by emergency room physician after being brought to the emergency department. Laboratory studies show on CBC a white blood count 11.2 with hemoglobin 10.2, hematocrit 39.8.  Chemistries were essentially within normal range.  CK 69, CK-MB 2.8, troponin less than 0.30.  Chest x-ray showed no abnormality.  Electrocardiogram within normal range.  SOCIAL HISTORY:  The patient was a former cigarette smoker, two pack a day for 40 years or more.  She does not use alcohol.  PAST MEDICAL AND SURGICAL HISTORY:  The patient has history of COPD, hypertension, hyperlipidemia, obesity, previous admission to the hospital with chest tightness, previous CVA.  The patient did have two stents placed, left internal carotid artery dissection.  SURGICAL HISTORY:  Previous splenectomy, colonoscopy, abdominal hysterectomy, cholecystectomy, breast biopsy, pharyngeal surgery with polyps, excessive laparotomy.  REVIEW OF SYSTEMS:  HEENT:  Negative.  CARDIOPULMONARY:  The patient does have recent onset of chest pain with diaphoresis and  slight dyspnea.  No cough.  GI:  No bowel irregularity or bleeding.  GU:  No dysuria or hematuria.  ALLERGIES:  TAPE, but no other allergies.  MEDICATION LIST: 1. Albuterol. 2. Aspirin 81 mg. 3. Black cohosh one daily. 4. Citalopram 20 mg daily. 5. Clorazepate 7.5 mg t.i.d. 6. Diltiazem 180 mg daily. 7. Magnesium 40 mg daily. 8. Fenofibrate 145 mg daily. 9. Furosemide 20 mg daily. 10.Hydrocodone/acetaminophen 7.5 mg/500 one every 8 hours as needed     for pain. 11.Loratadine 10 mg daily. 12.Nitroglycerin 0.4 mg under the tongue every 5 minutes as needed. 13.Prednisone 10 mg daily. 14.Rosuvastatin 20 mg daily.  PHYSICAL EXAMINATION:  GENERAL:  Alert female, somewhat obese. VITAL SIGNS:  Blood pressure 132/83, pulse 87, temperature 98.2, respirations 24. HEENT:  Eyes PERRLA.  TMs negative.  Oropharynx benign. NECK:  Supple.  No JVD or thyroid abnormalities. HEART:  Regular rhythm.  No murmurs. LUNGS:  Clear to P and A.  Slight tenderness over the left-sided chest wall. ABDOMEN:  No palpable organs or masses.  No organomegaly. EXTREMITIES:  Free of edema. NEUROLOGIC:  The patient has no focal neurological signs.  ASSESSMENT:  The patient admitted with left-sided chest pain, which is atypical, could be musculoskeletal.  She is admitted to rule out coronary artery disease, ischemic heart disease.  The patient also has history of chronic obstructive pulmonary disease, hypertension, hyperlipidemia, previous history of cerebrovascular accident.  Plan to monitor the patient's cardiac markers every 8 hours.  Continue sublingual nitroglycerin as needed.     Leidi Astle G. Renard Matter, MD     AGM/MEDQ  D:  12/12/2010  T:  12/12/2010  Job:  [id number]

## 2010-12-13 LAB — CARDIAC PANEL(CRET KIN+CKTOT+MB+TROPI)
CK, MB: 3.7 ng/mL (ref 0.3–4.0)
Relative Index: INVALID (ref 0.0–2.5)
Troponin I: <0.3 ng/mL (ref <0.30)

## 2010-12-13 MED ORDER — SODIUM CHLORIDE 0.9 % IJ SOLN
INTRAMUSCULAR | Status: AC
  Filled 2010-12-13: qty 10

## 2010-12-13 NOTE — Progress Notes (Signed)
Patient verbalizes understanding of discharge instructions. Patient escorted out by staff, transported home by family.

## 2010-12-13 NOTE — Discharge Summary (Signed)
[name], [name]             ACCOUNT NO.:  [id number]  MEDICAL RECORD NO.:  [id number]  LOCATION:  A325                          FACILITY:  [facility]  PHYSICIAN:  [name]. [name], M.D.DATE OF BIRTH:  04-25-1950  DATE OF ADMISSION:  12/12/2010 DATE OF DISCHARGE:  09/29/2012LH                              DISCHARGE SUMMARY   This 60 year old was admitted to the hospital with chest pain.  She had a benign 2-day hospitalization extending from September 28th to September 29th, 2012.  Her vital signs remained stable.  Her admission white cell count was 11,200 with a normal differential. Chem profile was normal except for a glucose of 204.  Cardiac enzymes x3 were normal.  BNP was 176.  EKG showed poor R-wave progression across the precordium, particularly going from V2-V3, but no significant change compared to a June 2012 EKG. Her chest x-ray showed no significant abnormality.  She was admitted to the hospital and continued on albuterol, diazepam, fenofibrate, furosemide, loratadine, rosuvastatin, prednisone, hydrocodone/acetaminophen.  She received nitroglycerin a number of times with minimal benefit.  She did have sharp chest pains that were significantly worse on palpation.  She had point tenderness throughout the left chest.  She also had what appeared to be an infected Bartholin cyst on the left.  There had been no change in cardiac enzymes and no change in EKG at the time of discharge.  She still was having sharp chest pains and the Bartholin cyst irritation, and it was felt that this could be treated outpatient.  She is discharged home on her admission meds, except that hydrocodone/acetaminophen and prednisone were stopped.  She is to use: 1. Albuterol by nebulizer p.r.n. 2. Albuterol inhaler p.r.n. 3. Citalopram 20 mg daily. 4. Clorazepate 7.5 mg t.i.d. 5. Diltiazem 180 mg daily. 6. Nexium 40 mg daily. 7. Fenofibrate 145 mg daily. 8. Furosemide 20 mg daily. 9.  Loratadine 10 mg daily p.r.n. allergy. 10.Rosuvastatin 20 mg daily.  Instead of using hydrocodone/acetaminophen, she is to use oxycodone/acetaminophen 10/325 one half to 1 tablet q.i.d. for pain and naproxen 500 mg b.i.d. (60 with no refills) as an anti-inflammatory for the chest wall tenderness.  She is to use doxycycline 100 mg b.i.d. (20 with no refills) for infection.  She is to be seen back in the office in roughly 2 days at which point, we will determine whether she needs an I and D of her Bartholin cyst abscess and see how her chest pain is doing.  If it is no better on anti- inflammatories, she might need a CT of the chest wall to further evaluate the cause of the pain.  FINAL DISCHARGE DIAGNOSES: 1. Chest wall syndrome. 2. Bartholin cyst abscess. 3. Hypercholesterolemia. 4. Status post carotid artery dissection. 5. Status post tobacco use disorder. 6. Obesity. 7. Chronic obstructive pulmonary disease. 8. Depression. 9. Benign essential hypertension. 10.Allergic rhinitis.     [name]. [name], M.D.     SDK/MEDQ  D:  12/13/2010  T:  12/13/2010  Job:  [id number]

## 2010-12-19 ENCOUNTER — Other Ambulatory Visit: Payer: Self-pay | Admitting: Family Medicine

## 2010-12-19 ENCOUNTER — Ambulatory Visit
Admission: RE | Admit: 2010-12-19 | Discharge: 2010-12-19 | Disposition: A | Discharge status: Home / Self Care | Payer: PRIVATE HEALTH INSURANCE | Source: Ambulatory Visit | Attending: Family Medicine | Admitting: Family Medicine

## 2010-12-19 DIAGNOSIS — N6009 Solitary cyst of unspecified breast: Secondary | ICD-10-CM

## 2010-12-19 DIAGNOSIS — N644 Mastodynia: Secondary | ICD-10-CM

## 2011-01-12 ENCOUNTER — Telehealth: Payer: Self-pay | Admitting: *Deleted

## 2011-01-12 NOTE — Telephone Encounter (Signed)
error 

## 2011-01-16 ENCOUNTER — Other Ambulatory Visit: Payer: PRIVATE HEALTH INSURANCE

## 2011-01-30 ENCOUNTER — Ambulatory Visit
Admission: RE | Admit: 2011-01-30 | Discharge: 2011-01-30 | Disposition: A | Discharge status: Home / Self Care | Payer: PRIVATE HEALTH INSURANCE | Source: Ambulatory Visit | Attending: Family Medicine | Admitting: Family Medicine

## 2011-01-30 DIAGNOSIS — N644 Mastodynia: Secondary | ICD-10-CM

## 2011-02-12 ENCOUNTER — Emergency Department (HOSPITAL_COMMUNITY): Payer: PRIVATE HEALTH INSURANCE

## 2011-02-12 ENCOUNTER — Encounter (HOSPITAL_COMMUNITY): Payer: Self-pay | Admitting: *Deleted

## 2011-02-12 ENCOUNTER — Emergency Department (HOSPITAL_COMMUNITY)
Admission: EM | Admit: 2011-02-12 | Discharge: 2011-02-12 | Disposition: A | Discharge status: Home / Self Care | Payer: PRIVATE HEALTH INSURANCE | Attending: Emergency Medicine | Admitting: Emergency Medicine

## 2011-02-12 DIAGNOSIS — J441 Chronic obstructive pulmonary disease with (acute) exacerbation: Secondary | ICD-10-CM

## 2011-02-12 DIAGNOSIS — Z9079 Acquired absence of other genital organ(s): Secondary | ICD-10-CM | POA: Insufficient documentation

## 2011-02-12 DIAGNOSIS — Z9089 Acquired absence of other organs: Secondary | ICD-10-CM | POA: Insufficient documentation

## 2011-02-12 DIAGNOSIS — J111 Influenza due to unidentified influenza virus with other respiratory manifestations: Secondary | ICD-10-CM | POA: Insufficient documentation

## 2011-02-12 DIAGNOSIS — I10 Essential (primary) hypertension: Secondary | ICD-10-CM | POA: Insufficient documentation

## 2011-02-12 DIAGNOSIS — Z7982 Long term (current) use of aspirin: Secondary | ICD-10-CM | POA: Insufficient documentation

## 2011-02-12 DIAGNOSIS — Z8673 Personal history of transient ischemic attack (TIA), and cerebral infarction without residual deficits: Secondary | ICD-10-CM | POA: Insufficient documentation

## 2011-02-12 DIAGNOSIS — R0602 Shortness of breath: Secondary | ICD-10-CM | POA: Insufficient documentation

## 2011-02-12 DIAGNOSIS — Z87891 Personal history of nicotine dependence: Secondary | ICD-10-CM | POA: Insufficient documentation

## 2011-02-12 DIAGNOSIS — E785 Hyperlipidemia, unspecified: Secondary | ICD-10-CM | POA: Insufficient documentation

## 2011-02-12 HISTORY — DX: Cardiac murmur, unspecified: R01.1

## 2011-02-12 LAB — CBC
MCH: 29.7 pg (ref 26.0–34.0)
Platelets: 385 10*3/uL (ref 150–400)
RBC: 4.98 MIL/uL (ref 3.87–5.11)
RDW: 13.6 % (ref 11.5–15.5)
WBC: 17.7 10*3/uL — ABNORMAL HIGH (ref 4.0–10.5)

## 2011-02-12 LAB — COMPREHENSIVE METABOLIC PANEL
AST: 27 U/L (ref 0–37)
Albumin: 4 g/dL (ref 3.5–5.2)
Calcium: 10.3 mg/dL (ref 8.4–10.5)
Chloride: 101 mEq/L (ref 96–112)
Creatinine, Ser: 0.72 mg/dL (ref 0.50–1.10)
Total Bilirubin: 0.3 mg/dL (ref 0.3–1.2)
Total Protein: 7.4 g/dL (ref 6.0–8.3)

## 2011-02-12 LAB — TROPONIN I: Troponin I: <0.3 ng/mL

## 2011-02-12 LAB — DIFFERENTIAL
Basophils Absolute: 0.2 10*3/uL — ABNORMAL HIGH (ref 0.0–0.1)
Eosinophils Relative: 1 % (ref 0–5)
Monocytes Absolute: 1.8 10*3/uL — ABNORMAL HIGH (ref 0.1–1.0)
Neutrophils Relative %: 66 % (ref 43–77)

## 2011-02-12 MED ORDER — SODIUM CHLORIDE 0.9 % IV BOLUS (SEPSIS)
2000.0000 mL | Freq: Once | INTRAVENOUS | Status: AC
  Administered 2011-02-12: 2000 mL via INTRAVENOUS

## 2011-02-12 MED ORDER — METHYLPREDNISOLONE SODIUM SUCC 125 MG IJ SOLR
125.0000 mg | Freq: Once | INTRAMUSCULAR | Status: AC
  Administered 2011-02-12: 125 mg via INTRAVENOUS
  Filled 2011-02-12: qty 2

## 2011-02-12 MED ORDER — SODIUM CHLORIDE 0.9 % IV SOLN: 1000.0000 mL | INTRAVENOUS | Status: DC

## 2011-02-12 MED ORDER — OXYCODONE-ACETAMINOPHEN 5-325 MG PO TABS: 2.0000 | ORAL_TABLET | ORAL | Status: AC | PRN

## 2011-02-12 MED ORDER — PREDNISONE 20 MG PO TABS: ORAL_TABLET | ORAL | Status: AC

## 2011-02-12 MED ORDER — METOCLOPRAMIDE HCL 5 MG/ML IJ SOLN
10.0000 mg | Freq: Once | INTRAMUSCULAR | Status: AC
  Administered 2011-02-12: 10 mg via INTRAVENOUS
  Filled 2011-02-12: qty 2

## 2011-02-12 MED ORDER — ALBUTEROL SULFATE (5 MG/ML) 0.5% IN NEBU
5.0000 mg | INHALATION_SOLUTION | Freq: Once | RESPIRATORY_TRACT | Status: AC
  Administered 2011-02-12: 5 mg via RESPIRATORY_TRACT
  Filled 2011-02-12: qty 1

## 2011-02-12 MED ORDER — HYDROMORPHONE HCL PF 1 MG/ML IJ SOLN
1.0000 mg | Freq: Once | INTRAMUSCULAR | Status: AC
  Administered 2011-02-12: 1 mg via INTRAVENOUS
  Filled 2011-02-12: qty 1

## 2011-02-12 MED ORDER — METOCLOPRAMIDE HCL 10 MG PO TABS: 10.0000 mg | ORAL_TABLET | Freq: Four times a day (QID) | ORAL | Status: AC | PRN

## 2011-02-12 MED ORDER — IPRATROPIUM BROMIDE 0.02 % IN SOLN
0.5000 mg | Freq: Once | RESPIRATORY_TRACT | Status: AC
  Administered 2011-02-12: 0.5 mg via RESPIRATORY_TRACT
  Filled 2011-02-12: qty 2.5

## 2011-02-12 NOTE — ED Provider Notes (Signed)
History     CSN: [id number] Arrival date & time: 02/12/2011  1:54 PM   First MD Initiated Contact with Patient 02/12/11 1437      Chief Complaint  Patient presents with  . Emesis  ILI  (Consider location/radiation/quality/duration/timing/severity/associated sxs/prior treatment) HPI This 60 year old female has a history of COPD who quit smoking a couple years ago and now presents with 2 days of fever, chills, cough, nasal congestion, sore throat, myalgias, arthralgias, nausea, vomiting, diarrhea, abdominal pain, chest pain, back pain, total body pain, fatigue, headache, but no rash or confusion. She has had some wheezing with minimal shortness of breath at times. She has no localized abdominal pain. Her symptoms are constant and moderately severe without treatment prior to arrival. Past Medical History  Diagnosis Date  . COPD (chronic obstructive pulmonary disease)   . Hypertension   . Hyperlipidemia   . Obesity   . Chest tightness   . Tobacco use disorder     50 pack years; questionably discontinued in 2010  . 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 Heart murmur     Past Surgical History  Procedure Date  . Splenectomy   . Colonoscopy 2011  . Abdominal hysterectomy   . Cholecystectomy   . Breast excisional biopsy     Left  . Larynx surgery     Polyps excised  . Laparotomy     History reviewed. No pertinent family history.  History  Substance Use Topics  . Smoking status: Former Smoker -- 2.0 packs/day for 40 years    Types: Cigarettes  . Smokeless tobacco: Never Used  . Alcohol Use: No    OB History    Grav Para Term Preterm Abortions TAB SAB Ect Mult Living                  Review of Systems  Constitutional: Positive for fever, appetite  change and fatigue.       10 Systems reviewed and are negative for acute change except as noted in the HPI.  HENT: Positive for congestion, sore throat and rhinorrhea.   Eyes: Negative for discharge and redness.  Respiratory: Positive for cough, shortness of breath and wheezing.   Cardiovascular: Positive for chest pain.  Gastrointestinal: Positive for nausea, vomiting, abdominal pain and diarrhea.  Musculoskeletal: Positive for myalgias, back pain and arthralgias.  Skin: Negative for rash.  Neurological: Negative for syncope, numbness and headaches.  Psychiatric/Behavioral:       No behavior change.    Allergies  Tape  Home Medications   Current Outpatient Rx  Name Route Sig Dispense Refill  . ALBUTEROL SULFATE (2.5 MG/3ML) 0.083% IN NEBU Nebulization Take 2.5 mg by nebulization every 4 (four) hours as needed. For shortness of breath     . ALBUTEROL 90 MCG/ACT IN AERS Inhalation Inhale 2 puffs into the lungs every 4 (four) hours as needed. For shortness of breath    . ASPIRIN EC 81 MG PO TBEC Oral Take 81 mg by mouth daily.      Marland Kitchen CITALOPRAM HYDROBROMIDE 20 MG PO TABS Oral Take 20 mg by mouth daily.      Marland Kitchen CLORAZEPATE DIPOTASSIUM 7.5 MG PO TABS Oral Take 7.5 mg by mouth 3 (three) times daily.      Marland Kitchen DILTIAZEM HCL COATED BEADS 180 MG PO CP24  Oral Take 180 mg by mouth daily.      Marland Kitchen ESOMEPRAZOLE MAGNESIUM 40 MG PO CPDR Oral Take 40 mg by mouth daily before breakfast.      . FENOFIBRATE 145 MG PO TABS Oral Take 145 mg by mouth daily.      . FUROSEMIDE 20 MG PO TABS Oral Take 20 mg by mouth daily.      Marland Kitchen LORATADINE 10 MG PO TABS Oral Take 10 mg by mouth daily.      Marland Kitchen NAPROXEN SODIUM 220 MG PO TABS Oral Take 220 mg by mouth daily as needed. Pain      . NITROGLYCERIN 0.4 MG SL SUBL Sublingual Place 0.4 mg under the tongue every 5 (five) minutes as needed.      Marland Kitchen ROSUVASTATIN CALCIUM 20 MG PO TABS Oral Take 1 tablet (20 mg total) by mouth daily. 30 tablet 6  . METOCLOPRAMIDE HCL 10 MG PO  TABS Oral Take 1 tablet (10 mg total) by mouth every 6 (six) hours as needed (nausea/headache). 6 tablet 0  . OXYCODONE-ACETAMINOPHEN 5-325 MG PO TABS Oral Take 2 tablets by mouth every 4 (four) hours as needed for pain. 20 tablet 0  . PREDNISONE 20 MG PO TABS  3 tabs po day one, then 2 po daily x 4 days 11 tablet 0    BP 122/70  Pulse 93  Temp(Src) 98.4 F (36.9 C) (Oral)  Resp 20  Ht 5\' 6"  (1.676 m)  Wt 200 lb (90.719 kg)  BMI 32.28 kg/m2  SpO2 96%  Physical Exam  Nursing note and vitals reviewed. Constitutional: She is oriented to person, place, and time.       Awake, alert, nontoxic appearance with baseline speech for patient.  HENT:  Head: Atraumatic.  Mouth/Throat: No oropharyngeal exudate.       Oropharynx clear but mucous membranes somewhat dry  Eyes: Conjunctivae and EOM are normal. Pupils are equal, round, and reactive to light. Right eye exhibits no discharge. Left eye exhibits no discharge.  Neck: Neck supple.  Cardiovascular: Normal rate and regular rhythm.   No murmur heard. Pulmonary/Chest: Effort normal. No stridor. No respiratory distress. She has wheezes. She has no rales. She exhibits tenderness.  Abdominal: Soft. Bowel sounds are normal. She exhibits no mass. There is tenderness. There is no rebound.       Diffuse mild tenderness  Musculoskeletal: She exhibits tenderness. She exhibits no edema.       Baseline ROM, moves extremities with no obvious new focal weakness. All 4 extremities and back diffusely tender  Lymphadenopathy:    She has no cervical adenopathy.  Neurological: She is alert and oriented to person, place, and time.       Awake, alert, cooperative and aware of situation; motor strength bilaterally; sensation normal to light touch bilaterally; peripheral visual fields full to confrontation; no facial asymmetry; tongue midline; major cranial nerves appear intact; no pronator drift, normal finger to nose bilaterally  Skin: No rash noted.    Psychiatric: She has a normal mood and affect.    ED Course  Procedures (including critical care time) Much better without shortness of breath in ED after treatment. Labs Reviewed  CBC - Abnormal; Notable for the following:    WBC 17.7 (*)    All other components within normal limits  DIFFERENTIAL - Abnormal; Notable for the following:    Neutro Abs 11.6 (*)    Monocytes Absolute 1.8 (*)    Basophils Absolute 0.2 (*)  All other components within normal limits  COMPREHENSIVE METABOLIC PANEL - Abnormal; Notable for the following:    ALT 37 (*)    All other components within normal limits  LIPASE, BLOOD  TROPONIN I   Dg Chest 2 View  02/12/2011  *RADIOLOGY REPORT*  Clinical Data: Cough and short of breath  CHEST - 2 VIEW  Comparison: 12/12/2010  Findings: Heart is normal in size.  Lungs are clear.  Irregular apical pleural thickening right greater than left is stable.  No pneumothorax.  IMPRESSION: No active cardiopulmonary disease.  Original Report Authenticated By: Donavan Burnet, M.D.     1. Influenza   2. COPD with acute exacerbation       MDM  I doubt any other EMC precluding discharge at this time including, but not necessarily limited to the following:SBI.Patient / Family / Caregiver informed of clinical course, understand medical decision-making process, and agree with plan.        Hurman Horn, MD 02/13/11 [id number]

## 2011-02-12 NOTE — ED Notes (Signed)
C/o n/v/d x 2 days; reports last loose stool just a few minutes ago; last vomited this morning; states has been able to tolerate po fluid w/o vomiting since that time; c/o nasal congestion and cough onset last night; denies pain; a&ox4; answers questions appropriately; in no apparent distress.

## 2011-02-12 NOTE — ED Notes (Signed)
Pt c/o sore throat, nausea, vomiting, diarrhea and fever x 2 days. Pt also c/o coughing since this am. Coughing up yellow phlegm.

## 2011-02-12 NOTE — ED Notes (Signed)
Left in c/o family for transport home; alert, in no distress. 

## 2011-04-17 ENCOUNTER — Other Ambulatory Visit: Payer: Self-pay | Admitting: Cardiology

## 2011-06-17 ENCOUNTER — Ambulatory Visit (HOSPITAL_COMMUNITY)
Admission: RE | Admit: 2011-06-17 | Discharge: 2011-06-17 | Disposition: A | Discharge status: Home / Self Care | Payer: PRIVATE HEALTH INSURANCE | Source: Ambulatory Visit | Attending: Family Medicine | Admitting: Family Medicine

## 2011-06-17 ENCOUNTER — Other Ambulatory Visit (HOSPITAL_COMMUNITY): Payer: Self-pay | Admitting: Family Medicine

## 2011-06-17 DIAGNOSIS — M25519 Pain in unspecified shoulder: Secondary | ICD-10-CM | POA: Insufficient documentation

## 2011-06-17 DIAGNOSIS — M19019 Primary osteoarthritis, unspecified shoulder: Secondary | ICD-10-CM | POA: Insufficient documentation

## 2011-06-17 DIAGNOSIS — M1990 Unspecified osteoarthritis, unspecified site: Secondary | ICD-10-CM

## 2011-06-25 ENCOUNTER — Ambulatory Visit (HOSPITAL_COMMUNITY)
Admission: RE | Admit: 2011-06-25 | Discharge: 2011-06-25 | Disposition: A | Discharge status: Home / Self Care | Payer: PRIVATE HEALTH INSURANCE | Source: Ambulatory Visit | Attending: Family Medicine | Admitting: Family Medicine

## 2011-06-25 ENCOUNTER — Other Ambulatory Visit (HOSPITAL_COMMUNITY): Payer: Self-pay | Admitting: Family Medicine

## 2011-06-25 DIAGNOSIS — R05 Cough: Secondary | ICD-10-CM

## 2011-06-25 DIAGNOSIS — J4489 Other specified chronic obstructive pulmonary disease: Secondary | ICD-10-CM | POA: Insufficient documentation

## 2011-06-25 DIAGNOSIS — R059 Cough, unspecified: Secondary | ICD-10-CM | POA: Insufficient documentation

## 2011-06-25 DIAGNOSIS — F17200 Nicotine dependence, unspecified, uncomplicated: Secondary | ICD-10-CM

## 2011-06-25 DIAGNOSIS — J449 Chronic obstructive pulmonary disease, unspecified: Secondary | ICD-10-CM | POA: Insufficient documentation

## 2011-08-01 ENCOUNTER — Emergency Department (HOSPITAL_COMMUNITY)
Admission: EM | Admit: 2011-08-01 | Discharge: 2011-08-02 | Disposition: A | Discharge status: Home / Self Care | Payer: PRIVATE HEALTH INSURANCE | Attending: Emergency Medicine | Admitting: Emergency Medicine

## 2011-08-01 ENCOUNTER — Encounter (HOSPITAL_COMMUNITY): Payer: Self-pay

## 2011-08-01 ENCOUNTER — Emergency Department (HOSPITAL_COMMUNITY): Payer: PRIVATE HEALTH INSURANCE

## 2011-08-01 DIAGNOSIS — R142 Eructation: Secondary | ICD-10-CM | POA: Insufficient documentation

## 2011-08-01 DIAGNOSIS — R1084 Generalized abdominal pain: Secondary | ICD-10-CM | POA: Insufficient documentation

## 2011-08-01 DIAGNOSIS — R112 Nausea with vomiting, unspecified: Secondary | ICD-10-CM | POA: Insufficient documentation

## 2011-08-01 DIAGNOSIS — E785 Hyperlipidemia, unspecified: Secondary | ICD-10-CM | POA: Insufficient documentation

## 2011-08-01 DIAGNOSIS — I2510 Atherosclerotic heart disease of native coronary artery without angina pectoris: Secondary | ICD-10-CM | POA: Insufficient documentation

## 2011-08-01 DIAGNOSIS — J449 Chronic obstructive pulmonary disease, unspecified: Secondary | ICD-10-CM | POA: Insufficient documentation

## 2011-08-01 DIAGNOSIS — I10 Essential (primary) hypertension: Secondary | ICD-10-CM | POA: Insufficient documentation

## 2011-08-01 DIAGNOSIS — Z79899 Other long term (current) drug therapy: Secondary | ICD-10-CM | POA: Insufficient documentation

## 2011-08-01 DIAGNOSIS — Z7982 Long term (current) use of aspirin: Secondary | ICD-10-CM | POA: Insufficient documentation

## 2011-08-01 DIAGNOSIS — Z8673 Personal history of transient ischemic attack (TIA), and cerebral infarction without residual deficits: Secondary | ICD-10-CM | POA: Insufficient documentation

## 2011-08-01 DIAGNOSIS — K5900 Constipation, unspecified: Secondary | ICD-10-CM | POA: Insufficient documentation

## 2011-08-01 DIAGNOSIS — R141 Gas pain: Secondary | ICD-10-CM | POA: Insufficient documentation

## 2011-08-01 DIAGNOSIS — J4489 Other specified chronic obstructive pulmonary disease: Secondary | ICD-10-CM | POA: Insufficient documentation

## 2011-08-01 HISTORY — DX: Atherosclerotic heart disease of native coronary artery without angina pectoris: I25.10

## 2011-08-01 HISTORY — DX: Dissection of carotid artery: I77.71

## 2011-08-01 LAB — URINALYSIS, ROUTINE W REFLEX MICROSCOPIC
Bilirubin Urine: NEGATIVE
Glucose, UA: NEGATIVE mg/dL
Protein, ur: NEGATIVE mg/dL

## 2011-08-01 LAB — DIFFERENTIAL
Band Neutrophils: 0 % (ref 0–10)
Eosinophils Absolute: 0.4 10*3/uL (ref 0.0–0.7)
Eosinophils Relative: 3 % (ref 0–5)
Metamyelocytes Relative: 0 %
Monocytes Absolute: 0.5 10*3/uL (ref 0.1–1.0)
Monocytes Relative: 4 % (ref 3–12)

## 2011-08-01 LAB — COMPREHENSIVE METABOLIC PANEL
ALT: 39 U/L — ABNORMAL HIGH (ref 0–35)
AST: 34 U/L (ref 0–37)
Albumin: 3.6 g/dL (ref 3.5–5.2)
Alkaline Phosphatase: 95 U/L (ref 39–117)
Potassium: 3.2 mEq/L — ABNORMAL LOW (ref 3.5–5.1)
Sodium: 136 mEq/L (ref 135–145)
Total Protein: 7.3 g/dL (ref 6.0–8.3)

## 2011-08-01 LAB — CBC
HCT: 42.4 % (ref 36.0–46.0)
MCV: 84.5 fL (ref 78.0–100.0)
RDW: 13.8 % (ref 11.5–15.5)
WBC: 12.6 10*3/uL — ABNORMAL HIGH (ref 4.0–10.5)

## 2011-08-01 LAB — URINE MICROSCOPIC-ADD ON

## 2011-08-01 MED ORDER — CIPROFLOXACIN IN D5W 400 MG/200ML IV SOLN
400.0000 mg | Freq: Once | INTRAVENOUS | Status: AC
  Administered 2011-08-01: 400 mg via INTRAVENOUS
  Filled 2011-08-01: qty 200

## 2011-08-01 MED ORDER — MORPHINE SULFATE 4 MG/ML IJ SOLN
4.0000 mg | INTRAMUSCULAR | Status: AC | PRN
  Administered 2011-08-01 (×2): 4 mg via INTRAVENOUS
  Filled 2011-08-01: qty 1

## 2011-08-01 MED ORDER — IOHEXOL 300 MG/ML  SOLN: 100.0000 mL | Freq: Once | INTRAMUSCULAR | Status: AC | PRN

## 2011-08-01 MED ORDER — METRONIDAZOLE 500 MG PO TABS: 500.0000 mg | ORAL_TABLET | Freq: Two times a day (BID) | ORAL | Status: AC

## 2011-08-01 MED ORDER — ALBUTEROL SULFATE (5 MG/ML) 0.5% IN NEBU
2.5000 mg | INHALATION_SOLUTION | Freq: Once | RESPIRATORY_TRACT | Status: AC
  Administered 2011-08-01: 2.5 mg via RESPIRATORY_TRACT
  Filled 2011-08-01: qty 0.5

## 2011-08-01 MED ORDER — CIPROFLOXACIN HCL 500 MG PO TABS: 500.0000 mg | ORAL_TABLET | Freq: Two times a day (BID) | ORAL | Status: DC

## 2011-08-01 MED ORDER — POLYETHYLENE GLYCOL 3350 17 G PO PACK: 17.0000 g | PACK | Freq: Every day | ORAL | Status: AC

## 2011-08-01 MED ORDER — SODIUM CHLORIDE 0.9 % IV SOLN
INTRAVENOUS | Status: DC
  Administered 2011-08-01: 21:00:00 via INTRAVENOUS

## 2011-08-01 MED ORDER — METRONIDAZOLE IN NACL 5-0.79 MG/ML-% IV SOLN
500.0000 mg | Freq: Once | INTRAVENOUS | Status: AC
  Administered 2011-08-02: 500 mg via INTRAVENOUS
  Filled 2011-08-01: qty 100

## 2011-08-01 MED ORDER — ONDANSETRON HCL 4 MG PO TABS: 4.0000 mg | ORAL_TABLET | Freq: Four times a day (QID) | ORAL | Status: AC

## 2011-08-01 MED ORDER — HYDROCODONE-ACETAMINOPHEN 5-325 MG PO TABS: 2.0000 | ORAL_TABLET | ORAL | Status: AC | PRN

## 2011-08-01 MED ORDER — ONDANSETRON HCL 4 MG/2ML IJ SOLN
4.0000 mg | INTRAMUSCULAR | Status: DC | PRN
  Administered 2011-08-01: 4 mg via INTRAVENOUS
  Filled 2011-08-01 (×2): qty 2

## 2011-08-01 MED ORDER — MORPHINE SULFATE 4 MG/ML IJ SOLN
4.0000 mg | INTRAMUSCULAR | Status: AC | PRN
  Administered 2011-08-01 – 2011-08-02 (×2): 4 mg via INTRAVENOUS
  Filled 2011-08-01 (×2): qty 1

## 2011-08-01 NOTE — ED Provider Notes (Addendum)
I assumed care of this patient from Dr. Clarene Duke at 2200.  Diffuse abdominal pain with obstipation and vomiting.  CT pending to r/o obstruction.  No obstruction on CT.  POssible focal colitis of descending colon. Questionable pneumonia on x-ray is evident as scarring on CT. Mild CBD dilation expected after cholecystectomy, LFTs normal. On reassessment, patient is in no distress. Mild diffuse tenderness to palpation. She has had no vomiting in the ED.  Will trial PO liquids and treat colitis with cipro and flagyl.  Patient has tolerated by mouth soda, crackers as well as a hamburger her husband got for her. She's had no vomiting. She has minimal right-sided abdominal pain. Rectal exam shows no fecal impaction, Hemoccult negative.  We'll discharge for outpatient followup, treated for colitis and give bowel regimen for constipation.. Return precautions discussed.  Glynn Octave, MD 08/01/11 [id number]  Glynn Octave, MD 08/01/11 1191

## 2011-08-01 NOTE — Discharge Instructions (Signed)
Abdominal Pain  Abdominal pain can be caused by many things. Your caregiver decides the seriousness of your pain by an examination and possibly blood tests and X-rays. Many cases can be observed and treated at home. Most abdominal pain is not caused by a disease and will probably improve without treatment. However, in many cases, more time must pass before a clear cause of the pain can be found. Before that point, it may not be known if you need more testing, or if hospitalization or surgery is needed.  HOME CARE INSTRUCTIONS    Do not take laxatives unless directed by your caregiver.   Take pain medicine only as directed by your caregiver.   Only take over-the-counter or prescription medicines for pain, discomfort, or fever as directed by your caregiver.   Try a clear liquid diet (broth, tea, or water) for as long as directed by your caregiver. Slowly move to a bland diet as tolerated.  SEEK IMMEDIATE MEDICAL CARE IF:    The pain does not go away.   You have a fever.   You keep throwing up (vomiting).   The pain is felt only in portions of the abdomen. Pain in the right side could possibly be appendicitis. In an adult, pain in the left lower portion of the abdomen could be colitis or diverticulitis.   You pass bloody or black tarry stools.  MAKE SURE YOU:    Understand these instructions.   Will watch your condition.   Will get help right away if you are not doing well or get worse.  Document Released: 12/10/2004 Document Revised: 02/19/2011 Document Reviewed: 10/19/2007  [employer] Patient Information 2012 [employer]  Colitis  Colitis is inflammation of the colon. Colitis can be a short-term or long-standing (chronic) illness. Crohn's disease and ulcerative colitis are 2 types of colitis which are chronic. They usually require lifelong treatment.  CAUSES   There are many different causes of colitis, including:   Viruses.   Germs (bacteria).   Medicine reactions.  SYMPTOMS     Diarrhea.   Intestinal bleeding.   Pain.   Fever.   Throwing up (vomiting).   Tiredness (fatigue).   Weight loss.   Bowel blockage.  DIAGNOSIS   The diagnosis of colitis is based on examination and stool or blood tests. X-rays, CT scan, and colonoscopy may also be needed.  TREATMENT   Treatment may include:   Fluids given through the vein (intravenously).   Bowel rest (nothing to eat or drink for a period of time).   Medicine for pain and diarrhea.   Medicines (antibiotics) that kill germs.   Cortisone medicines.   Surgery.  HOME CARE INSTRUCTIONS    Get plenty of rest.   Drink enough water and fluids to keep your urine clear or pale yellow.   Eat a well-balanced diet.   Call your caregiver for follow-up as recommended.  SEEK IMMEDIATE MEDICAL CARE IF:    You develop chills.   You have an oral temperature above 102 F (38.9 C), not controlled by medicine.   You have extreme weakness, fainting, or dehydration.   You have repeated vomiting.   You develop severe belly (abdominal) pain or are passing bloody or tarry stools.  MAKE SURE YOU:    Understand these instructions.   Will watch your condition.   Will get help right away if you are not doing well or get worse.  Document Released: 04/09/2004 Document Revised: 02/19/2011 Document Reviewed: 07/05/2009  [employer] Patient Information 

## 2011-08-01 NOTE — ED Notes (Signed)
Pt Vomiting,

## 2011-08-01 NOTE — ED Notes (Signed)
Pt states since Thursday has had lower abd pain and feels she may be constipated, also vomiting with same.

## 2011-08-01 NOTE — ED Provider Notes (Signed)
History     CSN: [id number]  Arrival date & time 08/01/11  2003   First MD Initiated Contact with Patient 08/01/11 2023      Chief Complaint  Patient presents with  . Emesis  . Constipation    HPI Pt was seen at 2040.  Per pt, c/o gradual onset and worsening of constant generalized abd "pain" x3 days.  Has been associated with multiple episodes of N/V and "constipation."  Pt states she has tried multiple OTC laxatives and enemas without having a BM.  Denies CP/palpitations, no SOB/cough, no back pain, no diarrhea, no fevers, no black or blood in stools or emesis.     Past Medical History  Diagnosis Date  . COPD (chronic obstructive pulmonary disease)   . Hypertension   . Hyperlipidemia   . Obesity   . Chest tightness   . Tobacco use disorder     50 pack years; questionably discontinued in 2010  . 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 Heart murmur   . Coronary artery disease   . Carotid artery dissection     left    Past Surgical History  Procedure Date  . Splenectomy   . Colonoscopy 2011  . Abdominal hysterectomy   . Cholecystectomy   . Breast excisional biopsy     Left  . Larynx surgery     Polyps excised  . Laparotomy   . Carotid stent   . Appendectomy     History  Substance Use Topics  . Smoking status: Former Smoker -- 2.0 packs/day for 40 years    Types: Cigarettes  . Smokeless tobacco: Never Used  . Alcohol Use: No    Review of Systems ROS: Statement: All systems negative except as marked or noted in the HPI; Constitutional: Negative for fever and chills. ; ; Eyes: Negative for eye pain, redness and discharge. ; ; ENMT: Negative for ear pain, hoarseness, nasal congestion, sinus pressure and sore throat. ; ; Cardiovascular: Negative for  chest pain, palpitations, diaphoresis, dyspnea and peripheral edema. ; ; Respiratory: Negative for cough, wheezing and stridor. ; ; Gastrointestinal: +N/V, abd pain, constipation.  Negative for diarrhea, blood in stool, hematemesis, jaundice and rectal bleeding. . ; ; Genitourinary: Negative for dysuria, flank pain and hematuria. ; ; Musculoskeletal: Negative for back pain and neck pain. Negative for swelling and trauma.; ; Skin: Negative for pruritus, rash, abrasions, blisters, bruising and skin lesion.; ; Neuro: Negative for headache, lightheadedness and neck stiffness. Negative for weakness, altered level of consciousness , altered mental status, extremity weakness, paresthesias, involuntary movement, seizure and syncope.     Allergies  Tape  Home Medications   Current Outpatient Rx  Name Route Sig Dispense Refill  . ALBUTEROL SULFATE (2.5 MG/3ML) 0.083% IN NEBU Nebulization Take 2.5 mg by nebulization every 4 (four) hours as needed. For shortness of breath     . ALBUTEROL 90 MCG/ACT IN AERS Inhalation Inhale 2 puffs into the lungs every 4 (four) hours as needed. For shortness of breath    . ASPIRIN EC 81 MG PO TBEC Oral Take 81 mg by mouth daily.      Marland Kitchen CITALOPRAM HYDROBROMIDE 20 MG PO TABS Oral Take 20 mg by mouth daily.      Marland Kitchen CLORAZEPATE DIPOTASSIUM 7.5 MG PO TABS Oral Take 7.5 mg  by mouth 3 (three) times daily.      . CRESTOR 20 MG PO TABS  TAKE 1 TABLET BY MOUTH DAILY. 30 each 6  . DILTIAZEM HCL ER COATED BEADS 180 MG PO CP24 Oral Take 180 mg by mouth daily.      Marland Kitchen ESOMEPRAZOLE MAGNESIUM 40 MG PO CPDR Oral Take 40 mg by mouth daily before breakfast.      . FENOFIBRATE 145 MG PO TABS Oral Take 145 mg by mouth daily.      . FUROSEMIDE 20 MG PO TABS Oral Take 20 mg by mouth daily as needed. Fluid retention    . LORATADINE 10 MG PO TABS Oral Take 10 mg by mouth daily.      Marland Kitchen NITROGLYCERIN 0.4 MG SL SUBL Sublingual Place 0.4 mg under the tongue every 5 (five) minutes as needed.        BP  136/82  Pulse 90  Temp(Src) 98 F (36.7 C) (Oral)  Resp 22  Ht 5\' 6"  (1.676 m)  Wt 200 lb (90.719 kg)  BMI 32.28 kg/m2  SpO2 97%  Physical Exam 2045: Physical examination:  Nursing notes reviewed; Vital signs and O2 SAT reviewed;  Constitutional: Well developed, Well nourished, Uncomfortable appearing; Head:  Normocephalic, atraumatic; Eyes: EOMI, PERRL, No scleral icterus; ENMT: Mouth and pharynx normal, Mucous membranes dry; Neck: Supple, Full range of motion, No lymphadenopathy; Cardiovascular: Regular rate and rhythm, No murmur, rub, or gallop; Respiratory: Breath sounds clear & equal bilaterally, No rales, rhonchi, wheezes, speaking full sentences with ease. Normal respiratory effort/excursion; Chest: Nontender, Movement normal; Abdomen: +diffusely tender to palp, +softly distended, Normal bowel sounds; Genitourinary: No CVA tenderness; Extremities: Pulses normal, No tenderness, No edema, No calf edema or asymmetry.; Neuro: AA&Ox3, Major CN grossly intact.  No gross focal motor or sensory deficits in extremities.; Skin: Color normal, Warm, Dry, no rash.    ED Course  Procedures   MDM  MDM Reviewed: nursing note, vitals and previous chart Interpretation: labs, x-ray and CT scan     Results for orders placed during the hospital encounter of 08/01/11  CBC      Component Value Range   WBC 12.6 (*) 4.0 - 10.5 (K/uL)   RBC 5.02  3.87 - 5.11 (MIL/uL)   Hemoglobin 14.5  12.0 - 15.0 (g/dL)   HCT 16.1  09.6 - 04.5 (%)   MCV 84.5  78.0 - 100.0 (fL)   MCH 28.9  26.0 - 34.0 (pg)   MCHC 34.2  30.0 - 36.0 (g/dL)   RDW 40.9  81.1 - 91.4 (%)   Platelets 359  150 - 400 (K/uL)  DIFFERENTIAL      Component Value Range   Neutrophils Relative 56  43 - 77 (%)   Lymphocytes Relative 37  12 - 46 (%)   Monocytes Relative 4  3 - 12 (%)   Eosinophils Relative 3  0 - 5 (%)   Basophils Relative 0  0 - 1 (%)   Band Neutrophils 0  0 - 10 (%)   Metamyelocytes Relative 0     Myelocytes 0      Promyelocytes Absolute 0     Blasts 0     nRBC 0  0 (/100 WBC)   Neutro Abs 7.0  1.7 - 7.7 (K/uL)   Lymphs Abs 4.7 (*) 0.7 - 4.0 (K/uL)   Monocytes Absolute 0.5  0.1 - 1.0 (K/uL)   Eosinophils Absolute 0.4  0.0 - 0.7 (K/uL)   Basophils Absolute 0.0  0.0 - 0.1 (K/uL)  COMPREHENSIVE METABOLIC PANEL      Component Value Range   Sodium 136  135 - 145 (mEq/L)   Potassium 3.2 (*) 3.5 - 5.1 (mEq/L)   Chloride 99  96 - 112 (mEq/L)   CO2 26  19 - 32 (mEq/L)   Glucose, Bld 197 (*) 70 - 99 (mg/dL)   BUN 21  6 - 23 (mg/dL)   Creatinine, Ser 1.61  0.50 - 1.10 (mg/dL)   Calcium 09.6  8.4 - 10.5 (mg/dL)   Total Protein 7.3  6.0 - 8.3 (g/dL)   Albumin 3.6  3.5 - 5.2 (g/dL)   AST 34  0 - 37 (U/L)   ALT 39 (*) 0 - 35 (U/L)   Alkaline Phosphatase 95  39 - 117 (U/L)   Total Bilirubin 0.2 (*) 0.3 - 1.2 (mg/dL)   GFR calc non Af Amer 90 (*) >90 (mL/min)   GFR calc Af Amer >90  >90 (mL/min)  LIPASE, BLOOD      Component Value Range   Lipase 34  11 - 59 (U/L)  LACTIC ACID, PLASMA      Component Value Range   Lactic Acid, Venous 1.9  0.5 - 2.2 (mmol/L)  PROCALCITONIN      Component Value Range   Procalcitonin <0.10      Dg Abd Acute W/chest 08/01/2011  *RADIOLOGY REPORT*  Clinical Data: Abdominal pain, nausea, vomiting.  ACUTE ABDOMEN SERIES (ABDOMEN 2 VIEW & CHEST 1 VIEW)  Comparison: 06/25/2011 chest radiograph  Findings: Mildly elevated left hemidiaphragm and associated left lower lobe opacity.  Heart size within normal limits.  No pneumothorax.  Surgical clips in the left upper quadrant and right upper quadrant. Bowel gas pattern is nonobstructive.  Contrast opacifies small bowel loops in a nonspecific pattern.  No free intraperitoneal air identified.  No acute osseous finding.  IMPRESSION: Left lower lobe opacity; atelectasis versus pneumonia.  Nonobstructive bowel gas pattern.  Original Report Authenticated By: Waneta Martins, M.D.     2215:  CT A/P pending.  Sign out to Dr. Manus Gunning.         Laray Anger, DO 08/03/11 1553

## 2011-08-03 LAB — URINE CULTURE
Colony Count: NO GROWTH
Culture  Setup Time: 201305192018

## 2011-08-05 ENCOUNTER — Emergency Department (HOSPITAL_COMMUNITY): Payer: PRIVATE HEALTH INSURANCE

## 2011-08-05 ENCOUNTER — Encounter (HOSPITAL_COMMUNITY): Payer: Self-pay | Admitting: *Deleted

## 2011-08-05 ENCOUNTER — Inpatient Hospital Stay (HOSPITAL_COMMUNITY)
Admission: EM | Admit: 2011-08-05 | Discharge: 2011-08-10 | DRG: 388 | Disposition: A | Discharge status: Home / Self Care | Payer: PRIVATE HEALTH INSURANCE | Attending: Family Medicine | Admitting: Family Medicine

## 2011-08-05 DIAGNOSIS — K2100 Gastro-esophageal reflux disease with esophagitis, without bleeding: Secondary | ICD-10-CM | POA: Diagnosis present

## 2011-08-05 DIAGNOSIS — J449 Chronic obstructive pulmonary disease, unspecified: Secondary | ICD-10-CM | POA: Diagnosis present

## 2011-08-05 DIAGNOSIS — I2510 Atherosclerotic heart disease of native coronary artery without angina pectoris: Secondary | ICD-10-CM | POA: Diagnosis present

## 2011-08-05 DIAGNOSIS — Z87891 Personal history of nicotine dependence: Secondary | ICD-10-CM

## 2011-08-05 DIAGNOSIS — K5289 Other specified noninfective gastroenteritis and colitis: Secondary | ICD-10-CM | POA: Diagnosis present

## 2011-08-05 DIAGNOSIS — Z8673 Personal history of transient ischemic attack (TIA), and cerebral infarction without residual deficits: Secondary | ICD-10-CM

## 2011-08-05 DIAGNOSIS — I10 Essential (primary) hypertension: Secondary | ICD-10-CM | POA: Diagnosis present

## 2011-08-05 DIAGNOSIS — K5900 Constipation, unspecified: Secondary | ICD-10-CM | POA: Diagnosis present

## 2011-08-05 DIAGNOSIS — K921 Melena: Secondary | ICD-10-CM | POA: Diagnosis present

## 2011-08-05 DIAGNOSIS — K5669 Other intestinal obstruction: Principal | ICD-10-CM | POA: Diagnosis present

## 2011-08-05 DIAGNOSIS — E876 Hypokalemia: Secondary | ICD-10-CM | POA: Diagnosis present

## 2011-08-05 DIAGNOSIS — K649 Unspecified hemorrhoids: Secondary | ICD-10-CM | POA: Diagnosis present

## 2011-08-05 DIAGNOSIS — K56609 Unspecified intestinal obstruction, unspecified as to partial versus complete obstruction: Secondary | ICD-10-CM

## 2011-08-05 DIAGNOSIS — F411 Generalized anxiety disorder: Secondary | ICD-10-CM | POA: Diagnosis present

## 2011-08-05 DIAGNOSIS — K226 Gastro-esophageal laceration-hemorrhage syndrome: Secondary | ICD-10-CM | POA: Diagnosis present

## 2011-08-05 DIAGNOSIS — E785 Hyperlipidemia, unspecified: Secondary | ICD-10-CM | POA: Diagnosis present

## 2011-08-05 DIAGNOSIS — J4489 Other specified chronic obstructive pulmonary disease: Secondary | ICD-10-CM | POA: Diagnosis present

## 2011-08-05 DIAGNOSIS — E039 Hypothyroidism, unspecified: Secondary | ICD-10-CM | POA: Diagnosis present

## 2011-08-05 LAB — URINALYSIS, ROUTINE W REFLEX MICROSCOPIC
Bilirubin Urine: NEGATIVE
Ketones, ur: NEGATIVE mg/dL
Leukocytes, UA: NEGATIVE
Nitrite: NEGATIVE
Urobilinogen, UA: 0.2 mg/dL (ref 0.0–1.0)
pH: 7.5 (ref 5.0–8.0)

## 2011-08-05 LAB — COMPREHENSIVE METABOLIC PANEL
AST: 46 U/L — ABNORMAL HIGH (ref 0–37)
Albumin: 4.1 g/dL (ref 3.5–5.2)
BUN: 12 mg/dL (ref 6–23)
CO2: 26 mEq/L (ref 19–32)
Calcium: 10.2 mg/dL (ref 8.4–10.5)
Creatinine, Ser: 0.68 mg/dL (ref 0.50–1.10)
GFR calc non Af Amer: >90 mL/min (ref >90)
Potassium: 3.6 mEq/L (ref 3.5–5.1)
Sodium: 138 mEq/L (ref 135–145)
Total Bilirubin: 0.3 mg/dL (ref 0.3–1.2)

## 2011-08-05 LAB — LIPASE, BLOOD: Lipase: 31 U/L (ref 11–59)

## 2011-08-05 LAB — CBC
Platelets: 430 10*3/uL — ABNORMAL HIGH (ref 150–400)
RBC: 5.29 MIL/uL — ABNORMAL HIGH (ref 3.87–5.11)
WBC: 11.1 10*3/uL — ABNORMAL HIGH (ref 4.0–10.5)

## 2011-08-05 LAB — DIFFERENTIAL
Eosinophils Absolute: 0.2 10*3/uL (ref 0.0–0.7)
Lymphocytes Relative: 32 % (ref 12–46)
Lymphs Abs: 3.6 10*3/uL (ref 0.7–4.0)
Neutro Abs: 6 10*3/uL (ref 1.7–7.7)
Neutrophils Relative %: 54 % (ref 43–77)

## 2011-08-05 LAB — URINE MICROSCOPIC-ADD ON

## 2011-08-05 LAB — LACTIC ACID, PLASMA: Lactic Acid, Venous: 1 mmol/L (ref 0.5–2.2)

## 2011-08-05 MED ORDER — HYDROMORPHONE HCL PF 1 MG/ML IJ SOLN
1.0000 mg | INTRAMUSCULAR | Status: AC | PRN
  Administered 2011-08-06 (×2): 1 mg via INTRAVENOUS
  Filled 2011-08-05 (×2): qty 1

## 2011-08-05 MED ORDER — ONDANSETRON HCL 4 MG/2ML IJ SOLN
4.0000 mg | Freq: Once | INTRAMUSCULAR | Status: AC
  Administered 2011-08-05: 4 mg via INTRAVENOUS
  Filled 2011-08-05: qty 2

## 2011-08-05 MED ORDER — LORAZEPAM 2 MG/ML IJ SOLN
1.0000 mg | Freq: Once | INTRAMUSCULAR | Status: AC
  Administered 2011-08-05: 1 mg via INTRAVENOUS
  Filled 2011-08-05: qty 1

## 2011-08-05 MED ORDER — SODIUM CHLORIDE 0.9 % IV BOLUS (SEPSIS)
1000.0000 mL | Freq: Once | INTRAVENOUS | Status: AC
  Administered 2011-08-05: 1000 mL via INTRAVENOUS

## 2011-08-05 MED ORDER — FLEET ENEMA 7-19 GM/118ML RE ENEM
1.0000 | ENEMA | Freq: Once | RECTAL | Status: AC
  Administered 2011-08-05: 1 via RECTAL

## 2011-08-05 MED ORDER — ONDANSETRON HCL 4 MG/2ML IJ SOLN
4.0000 mg | Freq: Three times a day (TID) | INTRAMUSCULAR | Status: AC | PRN
  Administered 2011-08-06: 4 mg via INTRAVENOUS
  Filled 2011-08-05: qty 2

## 2011-08-05 MED ORDER — HYDROMORPHONE HCL PF 1 MG/ML IJ SOLN
1.0000 mg | Freq: Once | INTRAMUSCULAR | Status: AC
  Administered 2011-08-05: 1 mg via INTRAVENOUS
  Filled 2011-08-05: qty 1

## 2011-08-05 MED ORDER — IOHEXOL 300 MG/ML  SOLN
100.0000 mL | Freq: Once | INTRAMUSCULAR | Status: AC | PRN
  Administered 2011-08-05: 100 mL via INTRAVENOUS

## 2011-08-05 MED ORDER — DEXTROSE-NACL 5-0.45 % IV SOLN
INTRAVENOUS | Status: DC
  Administered 2011-08-05: 22:00:00 via INTRAVENOUS

## 2011-08-05 NOTE — ED Notes (Addendum)
Pt c/o constipation, abd pain, states that she has tried several things at home without relief, states that she was seen in er over the weekend and is not any better, pt last bowel movement was last Wednesday. Pt states that today pt had small amount after fleece enema and mag. Citrate. Pt admits to nausea

## 2011-08-05 NOTE — ED Notes (Signed)
Taken to xray.

## 2011-08-05 NOTE — ED Notes (Signed)
Beeped Dr. Felecia Shelling on call for Dr. [name] to ED.

## 2011-08-05 NOTE — H&P (Signed)
[name], [name]             ACCOUNT NO.:  [id number]  MEDICAL RECORD NO.:  [id number]  LOCATION:  A326                          FACILITY:  [facility]  PHYSICIAN:  Jobany Montellano D. Felecia Shelling, MD   DATE OF BIRTH:  [birth year]  DATE OF ADMISSION:  08/05/2011 DATE OF DISCHARGE:  LH                             HISTORY & PHYSICAL   CHIEF COMPLAINT:  Abdominal pain, nausea, vomiting, and constipation.  HISTORY OF PRESENT ILLNESS:  This is a 61 year old female patient with history of multiple medical illnesses, came to emergency room with above complaint.  The patient had constipation for the last 1 week.  She was seen in emergency room, and later followed with her primary care physician.  She was given some laxatives to take every day.  However the patient continued to have constipation in spite of the laxative.  She started having distention and pain.  She came to emergency room where she had a CT scan of the abdomen.  The CT scan showed inflammatory changes involving proximal distal colon.  There is colon obstruction pattern, probable obstructing lesion that could not be ruled out.  Brief telephone consultation was done with the GI by emergency room physician. They have advised to admit the patient and they would follow her tomorrow.  The patient kept n.p.o. and NG tube was inserted.  She is going to be admitted for further evaluation and treatment.  REVIEW OF SYSTEMS:  The patient has abdominal distention, constipation, nausea, vomiting, and abdominal pain.  No fever, chills, headache, cough, chest pain, dysuria, urgency, or frequency of urination.  PAST MEDICAL HISTORY: 1. History of chronic obstructive pulmonary disease. 2. Hypertension. 3. Hyperlipidemia. 4. Status post CVA. 5. Heart murmur. 6. Coronary artery disease. 7. Carotid artery disease.  CURRENT MEDICATIONS: 1. Albuterol and Atrovent inhaler q.4 hours p.r.n. 2. Aspirin 81 mg daily. 3. Ciprofloxacin 500 mg p.o. daily q.12  hours. 4. Celexa 20 mg daily. 5. Crestor 20 mg daily. 6. Diltiazem 180 mg daily. 7. Nexium 40 mg daily. 8. Fenofibrate 145 mg daily. 9. Lasix 20 mg daily. 10.Lortab 5/325 two tablets q.4 hours daily. 11.Flagyl 500 mg b.i.d. 30. 12.Nitroglycerin 0.4 sublingual q.5 minutes p.r.n. 13.Zofran 4 mg p.r.n. 14.MiraLAX 17 g p.o. daily.  SOCIAL HISTORY:  The patient has a history of tobacco smoking in the past.  No history of alcohol or substance abuse.  FAMILY HISTORY:  Mother died from heart attack.  Her father had CA of the colon.  PHYSICAL EXAMINATION:  GENERAL/VITAL SIGNS:  The patient is alert, awake, sick-looking with vitals blood pressure 130/80, pulse 86, respiratory rate 16, temperature 98 degrees Fahrenheit. HEENT:  Pupils are equal and reactive. NECK:  Supple. CHEST:  Decreased air entry, few rhonchi. CARDIOVASCULAR SYSTEM:  First and second heart sounds heard, regular. ABDOMEN:  Distended.  Bowel sound is positive.  No mass or organomegaly. EXTREMITIES:  No leg edema.  LABORATORY DATA:  On admission, lipase is 31.  BMP, sodium 138, potassium 3.6, chloride 101, carbon dioxide 26, glucose 120, BUN is 12, creatinine 0.8, calcium 7.7.  CBC, WBC 7.1, hemoglobin 15.3, hematocrit 44.6, and platelets 430.  ASSESSMENT: 1. No bowel obstruction probably mechanical etiology not clear,  possibility of a colon mass has to be further evaluated. 2. History of chronic obstructive pulmonary disease. 3. Hypertension. 4. Hyperlipidemia. 5. Coronary artery disease. 6. Morbid obesity.  PLAN:  We will keep the patient n.p.o. and also insert NG tube for intermittent suctioning.  We will continue IV hydration.  We will do GI consult in a.m.  Continue supportive care.  We will continue to monitor.     Ernest Popowski D. Felecia Shelling, MD     TDF/MEDQ  D:  08/05/2011  T:  08/05/2011  Job:  [id number]

## 2011-08-05 NOTE — ED Provider Notes (Signed)
History  This chart was scribed for [name] Octave, MD by [name]. This patient was seen in room APA06/APA06 and the patient's care was started at 3:59PM.  CSN: [id number]  Arrival date & time 08/05/11  1454   First MD Initiated Contact with Patient 08/05/11 1559      Chief Complaint  Patient presents with  . Constipation  . Abdominal Pain    The history is provided by the patient. No language interpreter was used.    [name] is a 61 y.o. female who presents to the Emergency Department complaining of 8 days of constipation with associated abdominal pain described as pressure, distention, nausea and emesis. She reports that her last BM was 8 days ago. She states that she is passing gas still and has had no trouble eating. She reports her last episode emesis being this morning. She denies prior episodes of constipation. She denies a change in her daily medication as the cause of the symptoms. Pt was seen 4 days ago for the same symptoms, had a CT scan that showed colitis and was discharged with antibiotics. She states that she is still on the antibiotics and reports that they improved her pain temporarily. She states that she called her PCP 2 days ago and was told to try magnesium citrate. She denies that the magnesium citrate improved her symptoms. She states that she called her PCP back today and was told to try a fleet enema.She denies that the fleets improved her symptoms.  She denies urinary symptoms, vaginal bleeding and vaginal discharge. She has a h/o COPD, HTN, HLD, CAD and CVA. She is a former smoker but denies alcohol use.  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   Past Medical History  Diagnosis Date  . COPD (chronic obstructive pulmonary disease)   . Hypertension   . Hyperlipidemia   . Obesity   . Chest tightness   . Current smoker     50 pack years; questionably discontinued in 2010  . CVA (cerebral infarction) 2010    Right brain secondary to right internal carotid artery  dissection for which 2 stents were placed;F/u cerebral angiography in 05/2008-minimal plaque; healing of previously identified left internal carotid artery dissection  . Heart murmur   . Coronary artery disease   . Carotid artery dissection     left    Past Surgical History  Procedure Date  . Splenectomy   . Colonoscopy 2011  . Abdominal hysterectomy   . Cholecystectomy   . Breast excisional biopsy     Left  . Larynx surgery     Polyps excised  . Laparotomy   . Carotid stent   . Appendectomy     No family history on file.  History  Substance Use Topics  . Smoking status: Former Smoker -- 2.0 packs/day for 40 years    Types: Cigarettes  . Smokeless tobacco: Never Used  . Alcohol Use: No    Review of Systems  A complete 10 system review of systems was obtained and all systems are negative except as noted in the HPI and PMH.   Allergies  Tape  Home Medications   Current Outpatient Rx  Name Route Sig Dispense Refill  . ALBUTEROL SULFATE (2.5 MG/3ML) 0.083% IN NEBU Nebulization Take 2.5 mg by nebulization every 4 (four) hours as needed. For shortness of breath     . ALBUTEROL 90 MCG/ACT IN AERS Inhalation Inhale 2 puffs into the lungs every 4 (four) hours as needed. For shortness of breath    .  ASPIRIN EC 81 MG PO TBEC Oral Take 81 mg by mouth daily.      Marland Kitchen CIPROFLOXACIN HCL 500 MG PO TABS Oral Take 1 tablet (500 mg total) by mouth every 12 (twelve) hours. 14 tablet 0  . CITALOPRAM HYDROBROMIDE 20 MG PO TABS Oral Take 20 mg by mouth daily.      Marland Kitchen CLORAZEPATE DIPOTASSIUM 7.5 MG PO TABS Oral Take 7.5 mg by mouth 3 (three) times daily.      . CRESTOR 20 MG PO TABS  TAKE 1 TABLET BY MOUTH DAILY. 30 each 6  . DILTIAZEM HCL ER COATED BEADS 180 MG PO CP24 Oral Take 180 mg by mouth daily.      Marland Kitchen ESOMEPRAZOLE MAGNESIUM 40 MG PO CPDR Oral Take 40 mg by mouth daily before breakfast.      . FENOFIBRATE 145 MG PO TABS Oral Take 145 mg by mouth daily.      . FUROSEMIDE 20 MG PO TABS  Oral Take 20 mg by mouth daily as needed. Fluid retention    . HYDROCODONE-ACETAMINOPHEN 5-325 MG PO TABS Oral Take 2 tablets by mouth every 4 (four) hours as needed for pain. 10 tablet 0  . LORATADINE 10 MG PO TABS Oral Take 10 mg by mouth daily.      Marland Kitchen METRONIDAZOLE 500 MG PO TABS Oral Take 1 tablet (500 mg total) by mouth 2 (two) times daily. 14 tablet 0  . NITROGLYCERIN 0.4 MG SL SUBL Sublingual Place 0.4 mg under the tongue every 5 (five) minutes as needed.      Marland Kitchen ONDANSETRON HCL 4 MG PO TABS Oral Take 1 tablet (4 mg total) by mouth every 6 (six) hours. 12 tablet 0  . POLYETHYLENE GLYCOL 3350 PO PACK Oral Take 17 g by mouth daily. 14 each 0    Triage Vitals: BP 151/96  Pulse 93  Temp 98.1 F (36.7 C)  Resp 20  Wt 200 lb (90.719 kg)  SpO2 95%  Physical Exam  Nursing note and vitals reviewed. Constitutional: She is oriented to person, place, and time. She appears well-developed and well-nourished. No distress.  HENT:  Head: Normocephalic and atraumatic.  Mouth/Throat: Oropharynx is clear and moist.  Eyes: Conjunctivae and EOM are normal. Pupils are equal, round, and reactive to light.  Neck: Normal range of motion. Neck supple. No tracheal deviation present.  Cardiovascular: Normal rate, regular rhythm and normal heart sounds.   Pulmonary/Chest: Effort normal and breath sounds normal. No respiratory distress.  Abdominal: Soft. She exhibits distension (Mild). There is tenderness (Mild diffuse tenderness without peritoneal signs). There is no rebound and no guarding.       Decreased bowel sounds throughtout  Genitourinary:       Small non-thrombosed external hemorrhoids, empty rectal vault, guaiac negative  Musculoskeletal: Normal range of motion. She exhibits no edema.  Neurological: She is alert and oriented to person, place, and time. No cranial nerve deficit.  Skin: Skin is warm and dry.  Psychiatric: She has a normal mood and affect. Her behavior is normal.    ED Course    Procedures (including critical care time)  DIAGNOSTIC STUDIES: Oxygen Saturation is 95% on room air, adequate by my interpretation.    COORDINATION OF CARE: 4:10PM-Discussed treatment plan which includes repeat of abdomen scans with pt and pt agreed. 6:56PM-Pt rechecked and states that she feels better but had one more episode of emesis in the ED. Informed pt that she will need a colonoscopy to further investigate her  symptoms and discussed admission which pt agreed to.   Labs Reviewed  CBC - Abnormal; Notable for the following:    WBC 11.1 (*)    RBC 5.29 (*)    Hemoglobin 15.3 (*)    Platelets 430 (*)    All other components within normal limits  DIFFERENTIAL - Abnormal; Notable for the following:    Monocytes Absolute 1.3 (*)    All other components within normal limits  COMPREHENSIVE METABOLIC PANEL - Abnormal; Notable for the following:    Glucose, Bld 120 (*)    AST 46 (*)    ALT 60 (*)    All other components within normal limits  URINALYSIS, ROUTINE W REFLEX MICROSCOPIC - Abnormal; Notable for the following:    Color, Urine AMBER (*) BIOCHEMICALS MAY BE AFFECTED BY COLOR   APPearance CLOUDY (*)    Hgb urine dipstick TRACE (*)    All other components within normal limits  URINE MICROSCOPIC-ADD ON - Abnormal; Notable for the following:    Squamous Epithelial / LPF MANY (*)    Bacteria, UA FEW (*)    All other components within normal limits  LACTIC ACID, PLASMA  LIPASE, BLOOD   Ct Abdomen Pelvis W Contrast  08/05/2011  *RADIOLOGY REPORT*  Clinical Data: Abdominal pain  CT ABDOMEN AND PELVIS WITH CONTRAST  Technique:  Multidetector CT imaging of the abdomen and pelvis was performed following the standard protocol during bolus administration of intravenous contrast.  Contrast: OMNIPAQUE IOHEXOL 300 MG/ML  SOLN  Comparison: 4 days ago  Findings: There is persistent stranding about the proximal descending colon. These inflammatory changes have slightly improved.  There  is associated mild wall thickening.  This occurs at a transition point between distended colon and decompressed colon.  No abscess.  No extraluminal bowel gas.  Exam is otherwise stable.  IMPRESSION: Slightly improved inflammatory changes involving the proximal descending colon.  There is however a colonic obstruction pattern now with a transition point at the area of inflammation.  Obstructing lesion such as adenocarcinoma cannot be excluded.  Original Report Authenticated By: Donavan Burnet, M.D.   Dg Abd Acute W/chest  08/05/2011  *RADIOLOGY REPORT*  Clinical Data: Abdominal pain.  Constipation.  ACUTE ABDOMEN SERIES (ABDOMEN 2 VIEW & CHEST 1 VIEW)  Comparison: CT abdomen and pelvis 08/01/2011 and acute abdomen series of that same date and 04/26/2003.  Findings: Moderate gaseous distention of the entire colon, with residual oral contrast material in the colon from the prior CT.  No significant small bowel distention.  Air-fluid levels in the colon consistent with liquid stool.  No evidence of free intraperitoneal air.  Surgical clips in the right upper quadrant from prior cholecystectomy and in the left upper quadrant from prior splenectomy.  Cardiac silhouette normal in size, unchanged.  The thoracic aorta mildly atherosclerotic, unchanged.  Interval improvement aeration of the left lung base.  Lungs now clear.  Emphysematous changes in the upper lobes.  IMPRESSION:  1.  Mild diffuse colonic ileus.  Liquid stool in the colon. Residual oral contrast in the colon from the CT 4 days ago would suggest abnormal bowel motility. 2.  No evidence of bowel obstruction or free intraperitoneal air. 3.  COPD/emphysema.  No acute cardiopulmonary disease.  Original Report Authenticated By: Arnell Sieving, M.D.     No diagnosis found.    MDM  Abdominal pressure, distention, constipation for the past 8 days. Seen on May 18 and had CT scan negative for obstruction. States  has had no bowel movement despite enemas  and magnesium citrate. Last episode of vomiting yesterday. Is also seen Dr. [name] without relief. Currently on antibiotics for colitis which she stated improved her pain.  Ileus on AAS.  Labs stable.    I discussed patient's CT findings with the patient as well as Dr. Darrick Penna. Dr. Darrick Penna states that Dr. Karilyn Cota will need to see the patient in the morningTo evaluate for large bowel instruction.  Admission discussed with Dr. Felecia Shelling.  NG placed for decompression.  Patient to remain NPO overnight.  I personally performed the services described in this documentation, which was scribed in my presence.  The recorded information has been reviewed and considered.       [name] Octave, MD 08/06/11 [phone]

## 2011-08-06 ENCOUNTER — Encounter (HOSPITAL_COMMUNITY)
Admission: EM | Disposition: A | Discharge status: Home / Self Care | Payer: Self-pay | Source: Home / Self Care | Attending: Family Medicine

## 2011-08-06 ENCOUNTER — Encounter (HOSPITAL_COMMUNITY): Payer: Self-pay | Admitting: Internal Medicine

## 2011-08-06 DIAGNOSIS — K208 Other esophagitis without bleeding: Secondary | ICD-10-CM

## 2011-08-06 DIAGNOSIS — R109 Unspecified abdominal pain: Secondary | ICD-10-CM

## 2011-08-06 DIAGNOSIS — K922 Gastrointestinal hemorrhage, unspecified: Secondary | ICD-10-CM

## 2011-08-06 DIAGNOSIS — K449 Diaphragmatic hernia without obstruction or gangrene: Secondary | ICD-10-CM

## 2011-08-06 DIAGNOSIS — R112 Nausea with vomiting, unspecified: Secondary | ICD-10-CM

## 2011-08-06 DIAGNOSIS — K5289 Other specified noninfective gastroenteritis and colitis: Secondary | ICD-10-CM

## 2011-08-06 HISTORY — PX: ESOPHAGOGASTRODUODENOSCOPY: SHX5428

## 2011-08-06 HISTORY — PX: FLEXIBLE SIGMOIDOSCOPY: SHX5431

## 2011-08-06 LAB — OCCULT BLOOD GASTRIC / DUODENUM (SPECIMEN CUP)
Occult Blood, Gastric: POSITIVE — AB
pH, Gastric: 6

## 2011-08-06 SURGERY — EGD (ESOPHAGOGASTRODUODENOSCOPY)
Anesthesia: Moderate Sedation

## 2011-08-06 MED ORDER — MIDAZOLAM HCL 5 MG/5ML IJ SOLN
INTRAMUSCULAR | Status: DC | PRN
  Administered 2011-08-06 (×7): 2 mg via INTRAVENOUS

## 2011-08-06 MED ORDER — BUTAMBEN-TETRACAINE-BENZOCAINE 2-2-14 % EX AERO
INHALATION_SPRAY | CUTANEOUS | Status: DC | PRN
  Administered 2011-08-06: 2 via TOPICAL

## 2011-08-06 MED ORDER — MEPERIDINE HCL 50 MG/ML IJ SOLN
INTRAMUSCULAR | Status: AC
  Filled 2011-08-06: qty 1

## 2011-08-06 MED ORDER — SODIUM CHLORIDE 0.9 % IJ SOLN
INTRAMUSCULAR | Status: AC
  Administered 2011-08-06: 10 mL
  Filled 2011-08-06: qty 3

## 2011-08-06 MED ORDER — HYDROCODONE-ACETAMINOPHEN 7.5-325 MG PO TABS
1.0000 | ORAL_TABLET | ORAL | Status: DC | PRN
  Administered 2011-08-06 – 2011-08-10 (×11): 1 via ORAL
  Filled 2011-08-06 (×12): qty 1

## 2011-08-06 MED ORDER — MIDAZOLAM HCL 5 MG/5ML IJ SOLN
INTRAMUSCULAR | Status: AC
  Filled 2011-08-06: qty 5

## 2011-08-06 MED ORDER — PANTOPRAZOLE SODIUM 40 MG IV SOLR
40.0000 mg | Freq: Two times a day (BID) | INTRAVENOUS | Status: DC
  Administered 2011-08-06 – 2011-08-08 (×6): 40 mg via INTRAVENOUS
  Filled 2011-08-06 (×6): qty 40

## 2011-08-06 MED ORDER — DILTIAZEM HCL ER COATED BEADS 180 MG PO CP24
300.0000 mg | ORAL_CAPSULE | Freq: Every day | ORAL | Status: DC
  Administered 2011-08-06 – 2011-08-10 (×5): 300 mg via ORAL
  Filled 2011-08-06 (×5): qty 1

## 2011-08-06 MED ORDER — ENOXAPARIN SODIUM 40 MG/0.4ML ~~LOC~~ SOLN
40.0000 mg | SUBCUTANEOUS | Status: DC
  Administered 2011-08-06: 40 mg via SUBCUTANEOUS
  Filled 2011-08-06: qty 0.4

## 2011-08-06 MED ORDER — MIDAZOLAM HCL 5 MG/5ML IJ SOLN
INTRAMUSCULAR | Status: AC
  Filled 2011-08-06: qty 10

## 2011-08-06 MED ORDER — MEPERIDINE HCL 25 MG/ML IJ SOLN
INTRAMUSCULAR | Status: DC | PRN
  Administered 2011-08-06 (×2): 25 mg via INTRAVENOUS

## 2011-08-06 MED ORDER — DEXTROSE-NACL 5-0.45 % IV SOLN
INTRAVENOUS | Status: DC
  Administered 2011-08-06 – 2011-08-07 (×3): via INTRAVENOUS

## 2011-08-06 MED ORDER — LORAZEPAM 2 MG/ML IJ SOLN
1.0000 mg | Freq: Four times a day (QID) | INTRAMUSCULAR | Status: DC | PRN
  Administered 2011-08-06 – 2011-08-07 (×5): 1 mg via INTRAVENOUS
  Filled 2011-08-06 (×5): qty 1

## 2011-08-06 MED ORDER — METOCLOPRAMIDE HCL 5 MG/ML IJ SOLN
10.0000 mg | Freq: Four times a day (QID) | INTRAMUSCULAR | Status: AC
  Administered 2011-08-06 – 2011-08-07 (×4): 10 mg via INTRAVENOUS
  Filled 2011-08-06 (×4): qty 2

## 2011-08-06 NOTE — Consult Note (Signed)
Reason for Consult:abdominal pain Referring Physician: MARYLENE [name] is an 61 y.o. female.  HPI: [name] is a 61 yr old female admitted yesterday thru the ED with c/o of not having a BM. She tells me she has not had a BM x 9 days.  She does not have the urge to go.  She has tried 2 ExLax and a Fleets enema with no results. She also tried a Fleet's supp with no results. She has drank 2 glasses of prune juice and lots of water.  She has also tried Miralax without results. She tells me she was in the ED over the weekend for the same and was told she had colitis.  She denies having a prior hx of constipation. She followed up with Dr. Sudie [name] and was started on Cipro and Flagy.  Appetite good. No weight loss. She denies abdominal pain, however she is receiving IV pain medication.  She has coffee ground stomach contents in container in the room. Before her symptoms started, her stools were light brown. No melena or bright red rectal bleeding. Her last was 2 yrs ago and was normal per patient.   She underwent a CT which revealed: Ct Abdomen Pelvis W Contrast  08/05/2011  *RADIOLOGY REPORT*  Clinical Data: Abdominal pain  CT ABDOMEN AND PELVIS WITH CONTRAST  Technique:  Multidetector CT imaging of the abdomen and pelvis was performed following the standard protocol during bolus administration of intravenous contrast.  Contrast: OMNIPAQUE IOHEXOL 300 MG/ML  SOLN  Comparison: 4 days ago  Findings: There is persistent stranding about the proximal descending colon. These inflammatory changes have slightly improved.  There is associated mild wall thickening.  This occurs at a transition point between distended colon and decompressed colon.  No abscess.  No extraluminal bowel gas.  Exam is otherwise stable.  IMPRESSION: Slightly improved inflammatory changes involving the proximal descending colon.  There is however a colonic obstruction pattern now with a transition point at the area of  inflammation.  Obstructing lesion such as adenocarcinoma cannot be excluded.  Original Report Authenticated By: Donavan Burnet, M.D.   Dg Abd Acute W/chest  08/05/2011  *RADIOLOGY REPORT*  Clinical Data: Abdominal pain.  Constipation.  ACUTE ABDOMEN SERIES (ABDOMEN 2 VIEW & CHEST 1 VIEW)  Comparison: CT abdomen and pelvis 08/01/2011 and acute abdomen series of that same date and 04/26/2003.  Findings: Moderate gaseous distention of the entire colon, with residual oral contrast material in the colon from the prior CT.  No significant small bowel distention.  Air-fluid levels in the colon consistent with liquid stool.  No evidence of free intraperitoneal air.  Surgical clips in the right upper quadrant from prior cholecystectomy and in the left upper quadrant from prior splenectomy.  Cardiac silhouette normal in size, unchanged.  The thoracic aorta mildly atherosclerotic, unchanged.  Interval improvement aeration of the left lung base.  Lungs now clear.  Emphysematous changes in the upper lobes.  IMPRESSION:  1.  Mild diffuse colonic ileus.  Liquid stool in the colon. Residual oral contrast in the colon from the CT 4 days ago would suggest abnormal bowel motility. 2.  No evidence of bowel obstruction or free intraperitoneal air. 3.  COPD/emphysema.  No acute cardiopulmonary disease.  Original Report Authenticated By: Arnell Sieving, M.D.   Past Medical History  Diagnosis Date  . COPD (chronic obstructive pulmonary disease)   . Hypertension   . Hyperlipidemia   . Obesity   . Chest tightness   .  Current smoker     50 pack years; questionably discontinued in 2010  . 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 Heart murmur   . Coronary artery disease   . Carotid artery dissection     left     Past Surgical History  Procedure Date  . Splenectomy   . Colonoscopy 2011  . Abdominal hysterectomy   . Cholecystectomy   . Breast excisional biopsy     Left  . Larynx surgery     Polyps excised  . Laparotomy   . Carotid stent   . Appendectomy     History reviewed. No pertinent family history.  Social History:  reports that she quit smoking about 4 years ago. Her smoking use included Cigarettes. She has a 80 pack-year smoking history. She has never used smokeless tobacco. She reports that she does not drink alcohol or use illicit drugs.  Allergies:  Allergies  Allergen Reactions  . Tape Other (See Comments)    Causes blisters to form    Medications: I have reviewed the patient's current medications.  Results for orders placed during the hospital encounter of 08/05/11 (from the past 48 hour(s))  CBC     Status: Abnormal   Collection Time   08/05/11  4:04 PM      Component Value Range Comment   WBC 11.1 (*) 4.0 - 10.5 (K/uL)    RBC 5.29 (*) 3.87 - 5.11 (MIL/uL)    Hemoglobin 15.3 (*) 12.0 - 15.0 (g/dL)    HCT 62.9  52.8 - 41.3 (%)    MCV 84.3  78.0 - 100.0 (fL)    MCH 28.9  26.0 - 34.0 (pg)    MCHC 34.3  30.0 - 36.0 (g/dL)    RDW 24.4  01.0 - 27.2 (%)    Platelets 430 (*) 150 - 400 (K/uL)   DIFFERENTIAL     Status: Abnormal   Collection Time   08/05/11  4:04 PM      Component Value Range Comment   Neutrophils Relative 54  43 - 77 (%)    Neutro Abs 6.0  1.7 - 7.7 (K/uL)    Lymphocytes Relative 32  12 - 46 (%)    Lymphs Abs 3.6  0.7 - 4.0 (K/uL)    Monocytes Relative 12  3 - 12 (%)    Monocytes Absolute 1.3 (*) 0.1 - 1.0 (K/uL)    Eosinophils Relative 1  0 - 5 (%)    Eosinophils Absolute 0.2  0.0 - 0.7 (K/uL)    Basophils Relative 1  0 - 1 (%)    Basophils Absolute 0.1  0.0 - 0.1 (K/uL)   COMPREHENSIVE METABOLIC PANEL     Status: Abnormal   Collection Time   08/05/11  4:04 PM      Component Value Range Comment   Sodium 138  135 - 145 (mEq/L)    Potassium  3.6  3.5 - 5.1 (mEq/L)    Chloride 101  96 - 112 (mEq/L)    CO2 26  19 - 32 (mEq/L)    Glucose, Bld 120 (*) 70 - 99 (mg/dL)    BUN 12  6 - 23 (mg/dL)    Creatinine, Ser 5.36  0.50 - 1.10 (mg/dL)    Calcium 64.4  8.4 - 10.5 (mg/dL)    Total  Protein 7.7  6.0 - 8.3 (g/dL)    Albumin 4.1  3.5 - 5.2 (g/dL)    AST 46 (*) 0 - 37 (U/L)    ALT 60 (*) 0 - 35 (U/L)    Alkaline Phosphatase 108  39 - 117 (U/L)    Total Bilirubin 0.3  0.3 - 1.2 (mg/dL)    GFR calc non Af Amer >90  >90 (mL/min)    GFR calc Af Amer >90  >90 (mL/min)   LIPASE, BLOOD     Status: Normal   Collection Time   08/05/11  4:04 PM      Component Value Range Comment   Lipase 31  11 - 59 (U/L)   LACTIC ACID, PLASMA     Status: Normal   Collection Time   08/05/11  4:09 PM      Component Value Range Comment   Lactic Acid, Venous 1.0  0.5 - 2.2 (mmol/L)   URINALYSIS, ROUTINE W REFLEX MICROSCOPIC     Status: Abnormal   Collection Time   08/05/11  5:10 PM      Component Value Range Comment   Color, Urine AMBER (*) YELLOW  BIOCHEMICALS MAY BE AFFECTED BY COLOR   APPearance CLOUDY (*) CLEAR     Specific Gravity, Urine 1.015  1.005 - 1.030     pH 7.5  5.0 - 8.0     Glucose, UA NEGATIVE  NEGATIVE (mg/dL)    Hgb urine dipstick TRACE (*) NEGATIVE     Bilirubin Urine NEGATIVE  NEGATIVE     Ketones, ur NEGATIVE  NEGATIVE (mg/dL)    Protein, ur NEGATIVE  NEGATIVE (mg/dL)    Urobilinogen, UA 0.2  0.0 - 1.0 (mg/dL)    Nitrite NEGATIVE  NEGATIVE     Leukocytes, UA NEGATIVE  NEGATIVE    URINE MICROSCOPIC-ADD ON     Status: Abnormal   Collection Time   08/05/11  5:10 PM      Component Value Range Comment   Squamous Epithelial / LPF MANY (*) RARE     WBC, UA 0-2  <3 (WBC/hpf)    RBC / HPF 3-6  <3 (RBC/hpf)    Bacteria, UA FEW (*) RARE      Ct Abdomen Pelvis W Contrast  08/05/2011  *RADIOLOGY REPORT*  Clinical Data: Abdominal pain  CT ABDOMEN AND PELVIS WITH CONTRAST  Technique:  Multidetector CT imaging of the abdomen and pelvis was  performed following the standard protocol during bolus administration of intravenous contrast.  Contrast: OMNIPAQUE IOHEXOL 300 MG/ML  SOLN  Comparison: 4 days ago  Findings: There is persistent stranding about the proximal descending colon. These inflammatory changes have slightly improved.  There is associated mild wall thickening.  This occurs at a transition point between distended colon and decompressed colon.  No abscess.  No extraluminal bowel gas.  Exam is otherwise stable.  IMPRESSION: Slightly improved inflammatory changes involving the proximal descending colon.  There is however a colonic obstruction pattern now with a transition point at the area of inflammation.  Obstructing lesion such as adenocarcinoma cannot be excluded.  Original Report Authenticated By: Donavan Burnet, M.D.   Dg Abd Acute W/chest  08/05/2011  *RADIOLOGY REPORT*  Clinical Data: Abdominal pain.  Constipation.  ACUTE ABDOMEN SERIES (ABDOMEN 2 VIEW & CHEST 1 VIEW)  Comparison: CT abdomen and pelvis 08/01/2011 and acute abdomen series of that same date and 04/26/2003.  Findings: Moderate gaseous distention of the entire colon, with residual oral contrast material in  the colon from the prior CT.  No significant small bowel distention.  Air-fluid levels in the colon consistent with liquid stool.  No evidence of free intraperitoneal air.  Surgical clips in the right upper quadrant from prior cholecystectomy and in the left upper quadrant from prior splenectomy.  Cardiac silhouette normal in size, unchanged.  The thoracic aorta mildly atherosclerotic, unchanged.  Interval improvement aeration of the left lung base.  Lungs now clear.  Emphysematous changes in the upper lobes.  IMPRESSION:  1.  Mild diffuse colonic ileus.  Liquid stool in the colon. Residual oral contrast in the colon from the CT 4 days ago would suggest abnormal bowel motility. 2.  No evidence of bowel obstruction or free intraperitoneal air. 3.  COPD/emphysema.   No acute cardiopulmonary disease.  Original Report Authenticated By: Arnell Sieving, M.D.    ROS Blood pressure 166/89, pulse 80, temperature 98.1 F (36.7 C), temperature source Oral, resp. rate 20, height 5\' 6"  (1.676 m), weight 200 lb (90.719 kg), SpO2 90.00%. Physical ExamAlert and oriented. Skin warm and dry. Oral mucosa is moist.   . Sclera anicteric, conjunctivae is pink. Thyroid not enlarged. No cervical lymphadenopathy. Lungs clear. Heart regular rate and rhythm.  Abdomen is distended. Bowel sounds are positive. No abdominal masses felt. Diffuse lower abdominal  tenderness.  No edema to lower extremities.  Assessment/PlanProbably Left sided ischemic colitis. I discussed this case with Dr. Karilyn Cota.  CT reviewed. Dr. Karilyn Cota will examine patient later today.   SETZER,TERRI W 08/06/2011, 9:06 AM     GI attending note; Patient interviewed and examined. Lab studies reviewed including abdominopelvic CT from 08/01/2011 and 08/05/2011. Both studies show focal abnormality to descending colon with wall thickening and fecal matter. Patient's present illness began with constipation. She went 4 days without a bowel movement. Her constipation was followed by abdominal distention nausea and sporadic vomiting. She took OTC laxative MiraLax as well as mag citrate x2 but without any results. She came to the emergency room on 08/01/2011 and felt to have diverticulitis and discharge on antibiotics. She was subsequently seen by Dr. Sudie [name] her primary care physician in hospitalized. She had coffee-ground return when NG tube was placed. Patient does not recall that she had vigorous heaving or emesis. She has noted intermittent abdominal cramps and she describes as labor pains. Presently she has some discomfort in left upper and left lower quadrant. She has been passing flatness has had no stools. She was also given single Fleet Enema but without any results. Patient's last colonoscopy about 2 years ago at  [facility] without significant abnormality. Assessment; #1. 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 She does not have any symptoms to suggest peptic ulcer disease. This will be further evaluated with EGD. #2. Recent onset of constipation and thickening to focal segment of descending colon. Suspect she may have ischemic or focal colitis resulting in acute symptoms. She has few diverticula at sigmoid colon but these are not not associated with inflammatory changes. #3. Mildly elevated transaminases possibly due to fatty liver. Recommendations; Request colonoscopy records from [facility] in [place]. Diagnostic esophagogastroduodenoscopy followed by flexible sigmoidoscopy. She will be given single gentle tap water enema. Further recommendations to follow.

## 2011-08-06 NOTE — Op Note (Signed)
EGD AND FLEXIBLE SIGMOIDOSCOPY PROCEDURE REPORT  PATIENT:  [name]  MR#:  [id number] Birthdate:  [birth year], 61 y.o., female Endoscopist:  Dr. Malissa Hippo, MD Referred By:  Dr. Dollene Primrose, M.D. Procedure Date: 08/06/2011  Procedure:   EGD and flexible sigmoidoscopy.  Indications:  Patient is 61 year old Caucasian female who presents with obstipation abdominal distention nausea vomiting and also developed upper GI bleed. She has undergone 2 imaging studies. She has thickening to segment of descending colon which she also had fecal matter. She is undergoing diagnostic evaluation.            Informed Consent:  The risks, benefits, alternatives & imponderables which include, but are not limited to, bleeding, infection, perforation, drug reaction and potential missed lesion have been reviewed.  The potential for biopsy, lesion removal, esophageal dilation, etc. have also been discussed.  Questions have been answered.  All parties agreeable.  Please see history & physical in medical record for more information.  Medications:  Demerol 50 mg IV Versed 14 mg IV Cetacaine spray topically for oropharyngeal anesthesia  Description of procedure:  The endoscope was introduced through the mouth and advanced to the second portion of the duodenum without difficulty or limitations. The mucosal surfaces were surveyed very carefully during advancement of the scope and upon withdrawal.  Findings:  Esophagus:  Mucosa of the proximal and middle third was normal. Linear erosions are noted within the distal 3 cm of esophagus. GEJ:  37 cm Hiatus:  39cm Stomach:  Stomach was empty and distended very well with insufflation. Folds in the proximal stomach are normal.  Mucosae at gastric body revealed focal areas with erythema felt to be secondary to NG trauma. Antral mucosa was normal. Pyloric channel was patent and angularis was unremarkable. In the fundus close to cardia there was a linear ulcer  extending to upwards to GE junction. No stigmata of bleeding were noted. Duodenum:  Bulbar and post bulbar mucosa was normal.  Therapeutic/Diagnostic Maneuvers Performed:  None  Complications:  None  Flexible sigmoidoscopy. Patient was placed in left lateral recumbent position and rectal examination performed. No abnormality noted an external or digital exam. Entex he does go space rectum and advanced under vision into sigmoid colon and beyond. Scope was passed into descending colon were large piece of formed stool is identified. It was focal and linear ulceration in the same area. Unable to pass scope above this large piece of stool. Coastal biopsy was taken from ulcerated area. Unable to catch stool with a [name] net. It was broken using biopsy forceps started to move distally. No other abnormalities are noted as the scope was crusted withdrawn. Patient tolerated the procedures well.  Impression: 3 cm long Mallory-Weiss tear without stigmata of active bleeding. Erosive reflux esophagitis with small sliding hiatal hernia. Large piece of formed stool at the ascending colon with ulceration to mucosa. Biopsy taken. Suspect turgor ulcers secondary to obstipation rather than ischemic colitis.  Recommendations:  Pantoprazole 40 mg IV every 12 hours.  Lovenox discontinued.  Mechanical DVT prophylaxis with Flowtron. Clear liquids. [name] U  08/06/2011  3:18 PM  CC: Dr. Milana Obey, MD, MD & Dr. Bonnetta Barry ref. provider found

## 2011-08-06 NOTE — Progress Notes (Signed)
UR Chart Review Completed  

## 2011-08-06 NOTE — Progress Notes (Signed)
[name], [name]             ACCOUNT NO.:  [id number]  MEDICAL RECORD NO.:  [id number]  LOCATION:  A326                          FACILITY:  [facility]  PHYSICIAN:  [name]. [name], M.D.DATE OF BIRTH:  04-06-1950  DATE OF PROCEDURE: DATE OF DISCHARGE:                                PROGRESS NOTE   SUBJECTIVE:  She was admitted last night.  She had been having a problem with constipation and had been seen in the emergency room and had also been seen back in our office.  She had tried multiple things to move her bowels including stool softeners, magnesium citrate, Fleet Enema, Dulcolax, etc.  A few years ago, she was due for a colonoscopy through Dr. Karilyn Cota in [place], [place], at [facility].  By her account, there was a "kink" in the bowel, and colonoscopy could not be continued, so she said she was set up for a barium enema, but there were some problems with that, and she never had that done.  She thinks she was allergic to something involving the barium enema.  She has not had a problem since then.  Today, she is very distended and uncomfortable.  OBJECTIVE:  GENERAL:  She is supine in bed.  She has an NG tube in.  She is well-developed and obese.  She is middle-aged.  Her color is good. VITAL SIGNS:  Temperature is 98.1, pulse 80, respiratory rate 20, blood pressure 166/89. HEART:  Regular rhythm, rate of about 80. LUNGS:  Decreased breath sounds throughout, but she is moving air well. ABDOMEN:  She has distention of the entire abdomen.  There is generalized discomfort on palpation.  There is no edema of the ankles.  Her admission CMP was essentially normal except for mild elevation of AST and ALT, and her white cell count was 11,100 with a normal differential.  Her UA was essentially normal.  CT scan of the abdomen and pelvis was compared to a CT scan done 4 days ago.  She had persistent stranding in the proximal descending colon, and inflammatory changes in the bowel  had actually improved.  She had associated mild wall thickening.  There seemed to be a transition point between her descending colon and decompressed colon at the, again, proximal descending colon.  It was felt that she had developed a colonic obstruction pattern, and there was concern that she might have an obstructing lesion such as an adenocarcinoma.  ASSESSMENT: 1. Bowel obstruction. 2. Hypertension. 3. Anxiety. 4. Reflux esophagitis. 5. Hyperlipidemia. 6. Obesity. She will currently continue with NG suction.  She will need gastrointestinal evaluation with at least a sigmoidoscopy and possibly surgical evaluation as well.  Laparoscopy may be helpful.     [name]. [name], M.D.     SDK/MEDQ  D:  08/06/2011  T:  08/06/2011  Job:  [id number]

## 2011-08-06 NOTE — Care Management Note (Unsigned)
    Page 1 of 1   08/06/2011     2:59:41 PM   CARE MANAGEMENT NOTE 08/06/2011  Patient:  Bir,Valincia A   Account Number:  [id number]  Date Initiated:  08/06/2011  Documentation initiated by:  Sharrie Rothman  Subjective/Objective Assessment:   Pt admitted from home with small bowel obs. Pt has a son which lives with her. Pt is independent with ADL's PTA.  Pt will return home at discharge.Pt awaiting possible surgery.     Action/Plan:   No CM needs noted at this time. Will continue to monitor.   Anticipated DC Date:  08/12/2011   Anticipated DC Plan:  HOME/SELF CARE      DC Planning Services  CM consult      Choice offered to / List presented to:             Status of service:  In process, will continue to follow Medicare Important Message given?   (If response is "NO", the following Medicare IM given date fields will be blank) Date Medicare IM given:   Date Additional Medicare IM given:    Discharge Disposition:    Per UR Regulation:    If discussed at Long Length of Stay Meetings, dates discussed:    Comments:  08/06/11 1500 Arlyss Queen, RN BSN CM

## 2011-08-06 NOTE — Progress Notes (Signed)
Was asked by Dorene Ar, NP to obtain gastroccult of emesis since contents of NGT appeared bloody.  Gastroccult obtained and was positive for occult blood.  Dr. Karilyn Cota notified of results.  No new orders at this time.  MD stated he was going to round on patient soon.  Schonewitz, Candelaria Stagers 08/06/2011

## 2011-08-06 NOTE — Progress Notes (Signed)
Patient BP elevated 178/113, patient states that she takes Cardizem at home - has not been ordered since admit, called Dr. Renard Matter, new order given to restart Cardizem CD 300mg  po daily

## 2011-08-07 ENCOUNTER — Encounter (HOSPITAL_COMMUNITY): Payer: Self-pay | Admitting: Internal Medicine

## 2011-08-07 DIAGNOSIS — K5900 Constipation, unspecified: Secondary | ICD-10-CM

## 2011-08-07 LAB — CBC
HCT: 44.7 % (ref 36.0–46.0)
MCHC: 33.8 g/dL (ref 30.0–36.0)
RDW: 13.6 % (ref 11.5–15.5)
WBC: 15 10*3/uL — ABNORMAL HIGH (ref 4.0–10.5)

## 2011-08-07 LAB — BASIC METABOLIC PANEL
BUN: 7 mg/dL (ref 6–23)
Chloride: 97 mEq/L (ref 96–112)
GFR calc Af Amer: >90 mL/min (ref >90)
GFR calc non Af Amer: >90 mL/min (ref >90)
Potassium: 3.3 mEq/L — ABNORMAL LOW (ref 3.5–5.1)
Sodium: 136 mEq/L (ref 135–145)

## 2011-08-07 LAB — TSH: TSH: 14.114 u[IU]/mL — ABNORMAL HIGH (ref 0.350–4.500)

## 2011-08-07 MED ORDER — FLEET ENEMA 7-19 GM/118ML RE ENEM
1.0000 | ENEMA | Freq: Once | RECTAL | Status: AC
  Administered 2011-08-07: 1 via RECTAL

## 2011-08-07 MED ORDER — SODIUM CHLORIDE 0.9 % IJ SOLN
INTRAMUSCULAR | Status: AC
  Administered 2011-08-07: 10 mL
  Filled 2011-08-07: qty 3

## 2011-08-07 MED ORDER — ALBUTEROL SULFATE HFA 108 (90 BASE) MCG/ACT IN AERS
2.0000 | INHALATION_SPRAY | RESPIRATORY_TRACT | Status: DC | PRN
  Administered 2011-08-07 – 2011-08-08 (×2): 2 via RESPIRATORY_TRACT

## 2011-08-07 MED ORDER — LEVOTHYROXINE SODIUM 25 MCG PO TABS
25.0000 ug | ORAL_TABLET | Freq: Every day | ORAL | Status: DC
  Administered 2011-08-08 – 2011-08-10 (×3): 25 ug via ORAL
  Filled 2011-08-07 (×3): qty 1

## 2011-08-07 MED ORDER — KCL IN DEXTROSE-NACL 20-5-0.45 MEQ/L-%-% IV SOLN
INTRAVENOUS | Status: DC
  Administered 2011-08-07: 10:00:00 via INTRAVENOUS
  Administered 2011-08-07 – 2011-08-08 (×2): 1000 mL via INTRAVENOUS
  Administered 2011-08-08: 16:00:00 via INTRAVENOUS
  Administered 2011-08-09: 1000 mL via INTRAVENOUS

## 2011-08-07 MED ORDER — ALBUTEROL SULFATE HFA 108 (90 BASE) MCG/ACT IN AERS
INHALATION_SPRAY | RESPIRATORY_TRACT | Status: AC
  Filled 2011-08-07: qty 6.7

## 2011-08-07 NOTE — Progress Notes (Signed)
Pt did have a very small soft BM after first fleets enema.

## 2011-08-07 NOTE — Progress Notes (Signed)
Pt had moderate soft BM after repeat fleets enema.  Pt resting in bed on her R side at this time.

## 2011-08-07 NOTE — Progress Notes (Signed)
Subjective; Patient feels better today. She did pass flatus twice and had a small stool after she was given fleets enema. She is tolerating liquids. She has not experiencing nausea and vomiting. She feels her abdomen is less distended than yesterday. She has been ambulating on the floor. He did ask her pain medication once.. Objective; BP 163/85  Pulse 78  Temp(Src) 97.9 F (36.6 C) (Oral)  Resp 18  Ht 5\' 6"  (1.676 m)  Wt 200 lb (90.719 kg)  BMI 32.28 kg/m2  SpO2 94% Abdomen is distended less so than yesterday. Bowel sounds hypoactive. On palpation somewhat tense but no tenderness or rebound noted. Lab data; WBC 15.0 H&H 15.1 and 44.7, platelet count 433K Serum potassium 3.3 glucose 146, BUN 7 creatinine 0.68. TSH 14.114. Assessment; Colonic obstruction at descending colon most likely due to obstipation documented on flexible sigmoidoscopy yesterday. Focal colitis in this area felt to be stercoral ulcers rather than an ischemic process. Gradual symptomatic improvement. Elevated TSH suggesting hypothyroidism. Recommendations; Check free T4. Synthroid 25 mcg by mouth daily. Advance diet to full liquids. Fleets enema tonight. Patient advised to ambulate on the floor frequently. Discussed with Dr. [name]. Dr. Jena Gauss will be seeing patient for issues over the weekend.

## 2011-08-08 LAB — T4, FREE: Free T4: 1.14 ng/dL (ref 0.80–1.80)

## 2011-08-08 MED ORDER — POLYETHYLENE GLYCOL 3350 17 G PO PACK
17.0000 g | PACK | Freq: Every day | ORAL | Status: DC
  Administered 2011-08-08 – 2011-08-10 (×3): 17 g via ORAL
  Filled 2011-08-08: qty 1
  Filled 2011-08-08: qty 2
  Filled 2011-08-08: qty 1

## 2011-08-08 MED ORDER — LORAZEPAM 1 MG PO TABS
1.0000 mg | ORAL_TABLET | Freq: Four times a day (QID) | ORAL | Status: DC | PRN
  Administered 2011-08-08 – 2011-08-10 (×7): 1 mg via ORAL
  Filled 2011-08-08 (×7): qty 1

## 2011-08-08 MED ORDER — POTASSIUM CHLORIDE 10 MEQ/100ML IV SOLN
10.0000 meq | INTRAVENOUS | Status: AC
  Administered 2011-08-08 (×3): 10 meq via INTRAVENOUS
  Filled 2011-08-08: qty 300

## 2011-08-08 MED ORDER — SODIUM CHLORIDE 0.9 % IJ SOLN
INTRAMUSCULAR | Status: AC
  Administered 2011-08-08: 10 mL
  Filled 2011-08-08: qty 3

## 2011-08-08 MED ORDER — DOCUSATE SODIUM 100 MG PO CAPS
100.0000 mg | ORAL_CAPSULE | Freq: Two times a day (BID) | ORAL | Status: DC
  Administered 2011-08-08 – 2011-08-10 (×5): 100 mg via ORAL
  Filled 2011-08-08 (×5): qty 1

## 2011-08-08 NOTE — Progress Notes (Signed)
[name], [name]             ACCOUNT NO.:  [id number]  MEDICAL RECORD NO.:  [id number]  LOCATION:  A326                          FACILITY:  [facility]  PHYSICIAN:  Melvyn Novas, MDDATE OF BIRTH:  01-02-1951  DATE OF PROCEDURE: DATE OF DISCHARGE:                                PROGRESS NOTE   SUBJECTIVE:  The patient has focal descending colon colitis, functional and/or anatomic obstruction not certain and constipation along with anxiety, hypertension, GERD.  The patient has been on soft diet and doing well.  She had fairly normal bowel movement today.  Actually she had diarrhea but no significant blood.  OBJECTIVE:  VITAL SIGNS:  Blood pressure is 152/82, temperature 97.9, pulse is 83 and regular, respiratory rate is 20. LUNGS:  Show no rales, wheeze, or rhonchi. HEART:  Regular rhythm.  No murmurs, gallops, or rubs. ABDOMEN:  She has no abdominal pain, discomfort.  PLAN:  Right now is that her potassium is 3.3, we will give her KCl 10 mEq x3 IV. Monitor BMET in a.m. along with magnesium.  Monitor clinical course of constipation, obstipation, and follow up with Dr. Karilyn Cota and decide on colonoscopy and/or visualization of entire colon at his discretion.     Melvyn Novas, MD     RMD/MEDQ  D:  08/08/2011  T:  08/08/2011  Job:  [id number]

## 2011-08-08 NOTE — Progress Notes (Signed)
[id number] 

## 2011-08-08 NOTE — Progress Notes (Signed)
Subjective:  Feels better. Had a BM after fleets enema yesterday evening. 2 BMs this morning. Less abdominal pain. No nausea or vomiting.    I have reviewed the sigmoidoscopy and EGD images. I've also discussed the case with Dr. Karilyn Cota last evening.  Tolerating clear liquid diet.  Vital signs in last 24 hours: Temp:  [97.9 F (36.6 C)-98.2 F (36.8 C)] 97.9 F (36.6 C) (05/25 0558) Pulse Rate:  [78-98] 83  (05/25 0558) Resp:  [18-20] 20  (05/25 0558) BP: (152-163)/(82-85) 152/82 mmHg (05/25 0558) SpO2:  [94 %-97 %] 95 % (05/25 0558) Last BM Date: 08/08/11 General:   Alert,  Well-developed, well-nourished, pleasant and cooperative in NAD Abdomen:  Obese.  Normal bowel sounds, soft with some minimal periumbilical tenderness to palpation.  without guarding, and without rebound.  No mass or organomegaly. Extremities:  Without clubbing or edema.    Intake/Output from previous day: 05/24 0701 - 05/25 0700 In: 3037.5 [P.O.:440; I.V.:2597.5] Out: -  Intake/Output this shift: Total I/O In: 240 [P.O.:240] Out: -   Lab Results:  [facility] 08/07/11 0634 08/05/11 1604  WBC 15.0* 11.1*  HGB 15.1* 15.3*  HCT 44.7 44.6  PLT 433* 430*   BMET  Basename 08/07/11 0634 08/05/11 1604  NA 136 138  K 3.3* 3.6  CL 97 101  CO2 28 26  GLUCOSE 146* 120*  BUN 7 12  CREATININE 0.68 0.68  CALCIUM 9.5 10.2   LFT  Basename 08/05/11 1604  PROT 7.7  ALBUMIN 4.1  AST 46*  ALT 60*  ALKPHOS 108  BILITOT 0.3  BILIDIR --  IBILI --   Free T4 remains pending. No outside records regarding colonoscopy have arrived here as of yet  Impression: Severe obstipation appears to have been relieved now. Synthroid started for hypothyroidism. She needs to be on a regular bowel regimen.    Recommendations: Daily MiraLax. Daily Colace. Advance diet. Anticipate possible discharge the next 24 hours with followup by Dr. Karilyn Cota regarding elevated LFTs, biopsies etc.

## 2011-08-08 NOTE — Progress Notes (Signed)
[name], [name]             ACCOUNT NO.:  [id number]  MEDICAL RECORD NO.:  [id number]  LOCATION:  A326                          FACILITY:  [facility]  PHYSICIAN:  [name], M.D.DATE OF BIRTH:  1950/03/20  DATE OF PROCEDURE: DATE OF DISCHARGE:                                PROGRESS NOTE   SUBJECTIVE:  She did have an EGD and a sigmoidoscopy yesterday through Dr. Karilyn Cota, her gastroenterologist.  He was able to remove part of a very large obstipated stool in the descending colon using his biopsy forceps.   Since then, she says she has had a small amount of bowel movement.  She had one Fleet's enema this morning, may have a second.  Her NG tube now is out.  OBJECTIVE:  VITAL SIGNS:  Temp is 98.3, pulse 79, respiratory rate 18, blood pressure 149/89. GENERAL:  She is sitting up in bed.  NG tube is gone. HEART:  Has a regular rhythm, rate of 80. LUNGS:  Appear clear throughout. ABDOMEN:  Is still very distended and tight.  ASSESSMENT: 1. Bowel obstruction.  Question etiology. 2. Hypertension. 3. Anxiety. 4. Reflux esophagitis, with a small Mallory-Weiss tear noted on EGD.  PLAN:  If she has good results from enemas this morning, she probably can go home with further workup of the bowel proximal to this blockage outpatient.  We will discuss with GI.     [name], M.D.     SDK/MEDQ  D:  08/07/2011  T:  08/08/2011  Job:  [id number]

## 2011-08-09 LAB — BASIC METABOLIC PANEL
BUN: 6 mg/dL (ref 6–23)
Calcium: 10.1 mg/dL (ref 8.4–10.5)
Chloride: 99 mEq/L (ref 96–112)
Creatinine, Ser: 0.65 mg/dL (ref 0.50–1.10)
GFR calc Af Amer: >90 mL/min (ref >90)

## 2011-08-09 MED ORDER — PANTOPRAZOLE SODIUM 40 MG PO TBEC
40.0000 mg | DELAYED_RELEASE_TABLET | Freq: Two times a day (BID) | ORAL | Status: DC
  Administered 2011-08-09 – 2011-08-10 (×3): 40 mg via ORAL
  Filled 2011-08-09 (×4): qty 1

## 2011-08-09 NOTE — Progress Notes (Signed)
Pt found outside smoking.  Educated pt to smoking policy and asked pt  Not to leave floor any more. Will continue to monitor pt when she is ambulating.

## 2011-08-09 NOTE — Progress Notes (Signed)
[id number] 

## 2011-08-10 LAB — BASIC METABOLIC PANEL
BUN: 8 mg/dL (ref 6–23)
CO2: 29 mEq/L (ref 19–32)
Calcium: 10 mg/dL (ref 8.4–10.5)
Chloride: 100 mEq/L (ref 96–112)
Creatinine, Ser: 0.68 mg/dL (ref 0.50–1.10)
GFR calc Af Amer: >90 mL/min (ref >90)

## 2011-08-10 MED ORDER — LEVOTHYROXINE SODIUM 25 MCG PO TABS: 25.0000 ug | ORAL_TABLET | Freq: Every day | ORAL | Status: DC

## 2011-08-10 MED ORDER — DSS 100 MG PO CAPS: 100.0000 mg | ORAL_CAPSULE | Freq: Two times a day (BID) | ORAL | Status: AC

## 2011-08-10 NOTE — Progress Notes (Signed)
NAMEJAQUILLA, WOODROOF             ACCOUNT NO.:  [id number]  MEDICAL RECORD NO.:  [id number]  LOCATION:  A326                          FACILITY:  [facility]  PHYSICIAN:  Melvyn Novas, MDDATE OF BIRTH:  12/19/1950  DATE OF PROCEDURE: DATE OF DISCHARGE:                                PROGRESS NOTE   The patient has a functional and/or anatomic descending colon obstruction, has had normal bowel movement this morning.  However there was some hematochezia noted.  She had sigmoidoscopy on Friday, revealed no evidence of colonic lesion in the distal postobstruction area, has some low potassium yesterday 3.3, now corrected with IV potassium. Currently advanced to regular diet.  We will see how she tolerates diet.  Blood pressure is 102/78, temperature is 98, pulse is 92 and regular, respiratory rate is 20, O2 sat is 96%.  Hemoglobin is 15.1 on the 24th, potassium 3.7 today.  Plan right now is to observe dietary pattern, observe any blood in the stool which may be coming from hemorrhoids and lungs are clear.  No rales, wheeze, or rhonchi.  Abdomen is obese, soft, nontender.  Bowel sounds normoactive.  No guarding, rebound, mass, or megaly.     Melvyn Novas, MD     RMD/MEDQ  D:  08/09/2011  T:  08/10/2011  Job:  [id number]

## 2011-08-10 NOTE — Discharge Summary (Signed)
[name], [name]             ACCOUNT NO.:  [id number]  MEDICAL RECORD NO.:  [id number]  LOCATION:  A326                          FACILITY:  [facility]  PHYSICIAN:  Melvyn Novas, MDDATE OF BIRTH:  Mar 17, 1950  DATE OF ADMISSION:  08/05/2011 DATE OF DISCHARGE:  LH                              DISCHARGE SUMMARY   HISTORY OF PRESENT ILLNESS AND HOSPITAL COURSE:  The patient is a 61- year-old moderately obese white female, patient of Dr. [name], who is in hospital with obstipation, constipation, and a functional bowel obstruction in the descending colon.  The patient had a sigmoidoscopy and decompression performed.  She had subsequent bowel movements and had a little bleeding thereafter.  The patient reports she has had a previous colonoscopy 2 years ago by Dr. Karilyn Cota revealing no evidence of any cancerous lesion.  She stated there was a kink in her colon.  The patient likewise had hypertension, coronary artery disease, hyperlipidemia, newly diagnosed hypothyroidism, GERD, and chronic constipation.  She was seen in consultation by GI and she subsequently had 2 to 3 normal bowel movements, any rectal bleeding subsided.  She did have hemorrhoids.  She was advanced to full diet and then regular diet and she tolerated this well.  She was hemodynamically stable in the hospital and had normal renal function, normal electrolytes, CBC, and so forth.  DISCHARGE MEDICATIONS:  The patient was subsequently discharged on the following medications: 1. Proventil HFA inhaler 2 puffs q.i.d. 2. Proventil nebulizer 0.83% q.i.d. p.r.n. 3. Aspirin 81 mg per day. 4. Celexa 20 mg p.o. daily. 5. Tranxene 7.5 mg t.i.d. p.r.n. 6. Crestor 20 mg p.o. daily. 7. Diltiazem CD 300 mg p.o. daily. 8. Docusate sodium 100 mg p.o. b.i.d. 9. Nexium 40 mg p.o. daily. 10.TriCor 145 mg p.o. daily. 11.Lasix 20 mg p.o. daily. 12.Hydrocodone 5/325 q.4h p.r.n. for pain. 13.Synthroid 25 mcg p.o.  daily. 14.Continue Flagyl 500 mg p.o. b.i.d. for 2 additional days after     hospitalization. 15.Sublingual nitroglycerin 0.4 mg tablet p.r.n. for chest pain. 16.MiraLAX 17 g p.o. daily.  DISCHARGE INSTRUCTIONS:  She is urged to follow up with Dr. Karilyn Cota as an outpatient for GI issues and to follow up this week with primary care doctor, Dr. [name] for reassessment of any discharge medicines, review of her symptomatology, and to maintain activity.  Smoking cessation was advised as well as physical activity to aid in bowel regimen.     Melvyn Novas, MD     RMD/MEDQ  D:  08/10/2011  T:  08/10/2011  Job:  [id number]

## 2011-08-10 NOTE — Discharge Summary (Signed)
[id number] 

## 2011-08-10 NOTE — Discharge Summary (Signed)
Pt dc'd to home with discharge materials

## 2011-08-14 ENCOUNTER — Encounter (INDEPENDENT_AMBULATORY_CARE_PROVIDER_SITE_OTHER): Payer: Self-pay | Admitting: *Deleted

## 2011-08-17 ENCOUNTER — Telehealth (INDEPENDENT_AMBULATORY_CARE_PROVIDER_SITE_OTHER): Payer: Self-pay | Admitting: Internal Medicine

## 2011-08-17 NOTE — Telephone Encounter (Signed)
Patient had a colonoscopy in March 2009. Area behind the valve was not seen. She was scheduled for barium enema but declined to have it. We'll consider repeat colonoscopy later this year or next with overtube. Patient will need office visit in one month regarding the recent diagnosis of left-sided colitis

## 2011-08-18 NOTE — Telephone Encounter (Signed)
Called patient and there was no answer or message machine.

## 2011-08-19 ENCOUNTER — Encounter (INDEPENDENT_AMBULATORY_CARE_PROVIDER_SITE_OTHER): Payer: Self-pay

## 2011-08-21 NOTE — Telephone Encounter (Signed)
LM for patient to return the call to get an apt scheduled.  

## 2011-08-21 NOTE — Telephone Encounter (Signed)
Apt has been scheduled for July 23/13 with Dr. Karilyn Cota.

## 2011-10-06 ENCOUNTER — Ambulatory Visit (INDEPENDENT_AMBULATORY_CARE_PROVIDER_SITE_OTHER): Payer: PRIVATE HEALTH INSURANCE | Admitting: Internal Medicine

## 2011-11-17 ENCOUNTER — Emergency Department (HOSPITAL_COMMUNITY)
Admission: EM | Admit: 2011-11-17 | Discharge: 2011-11-17 | Disposition: A | Discharge status: Home / Self Care | Payer: PRIVATE HEALTH INSURANCE | Attending: Emergency Medicine | Admitting: Emergency Medicine

## 2011-11-17 ENCOUNTER — Encounter (HOSPITAL_COMMUNITY): Payer: Self-pay | Admitting: *Deleted

## 2011-11-17 DIAGNOSIS — I2510 Atherosclerotic heart disease of native coronary artery without angina pectoris: Secondary | ICD-10-CM | POA: Insufficient documentation

## 2011-11-17 DIAGNOSIS — J449 Chronic obstructive pulmonary disease, unspecified: Secondary | ICD-10-CM | POA: Insufficient documentation

## 2011-11-17 DIAGNOSIS — H5710 Ocular pain, unspecified eye: Secondary | ICD-10-CM | POA: Insufficient documentation

## 2011-11-17 DIAGNOSIS — Z9861 Coronary angioplasty status: Secondary | ICD-10-CM | POA: Insufficient documentation

## 2011-11-17 DIAGNOSIS — E785 Hyperlipidemia, unspecified: Secondary | ICD-10-CM | POA: Insufficient documentation

## 2011-11-17 DIAGNOSIS — I10 Essential (primary) hypertension: Secondary | ICD-10-CM | POA: Insufficient documentation

## 2011-11-17 DIAGNOSIS — J4489 Other specified chronic obstructive pulmonary disease: Secondary | ICD-10-CM | POA: Insufficient documentation

## 2011-11-17 DIAGNOSIS — Z87891 Personal history of nicotine dependence: Secondary | ICD-10-CM | POA: Insufficient documentation

## 2011-11-17 DIAGNOSIS — H5712 Ocular pain, left eye: Secondary | ICD-10-CM

## 2011-11-17 MED ORDER — TOBRAMYCIN 0.3 % OP SOLN
2.0000 [drp] | Freq: Once | OPHTHALMIC | Status: AC
  Administered 2011-11-17: 2 [drp] via OPHTHALMIC
  Filled 2011-11-17: qty 5

## 2011-11-17 MED ORDER — TETRACAINE HCL 0.5 % OP SOLN
OPHTHALMIC | Status: AC
  Administered 2011-11-17: 19:00:00
  Filled 2011-11-17: qty 2

## 2011-11-17 MED ORDER — KETOROLAC TROMETHAMINE 0.5 % OP SOLN
1.0000 [drp] | Freq: Once | OPHTHALMIC | Status: AC
  Administered 2011-11-17: 1 [drp] via OPHTHALMIC
  Filled 2011-11-17: qty 5

## 2011-11-17 NOTE — ED Provider Notes (Signed)
History     CSN: [id number]  Arrival date & time 11/17/11  1403   First MD Initiated Contact with Patient 11/17/11 1812      Chief Complaint  Patient presents with  . Foreign Body in Eye    (Consider location/radiation/quality/duration/timing/severity/associated sxs/prior treatment) HPI Comments: Feels like something flew into L eye 3 days ago.  Irritated since then.  DT  UTD  The history is provided by the patient. No language interpreter was used.    Past Medical History  Diagnosis Date  . COPD (chronic obstructive pulmonary disease)   . Hypertension   . Hyperlipidemia   . Obesity   . Chest tightness   . Tobacco use disorder     50 pack years; questionably discontinued in 2010  . 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 Heart murmur   . Coronary artery disease   . Carotid artery dissection     left    Past Surgical History  Procedure Date  . Splenectomy   . Colonoscopy 2011  . Abdominal hysterectomy   . Cholecystectomy   . Breast excisional biopsy     Left  . Larynx surgery     Polyps excised  . Laparotomy   . Carotid stent   . Appendectomy   . Cardiac stent x 2   . Esophagogastroduodenoscopy 08/06/2011    Procedure: ESOPHAGOGASTRODUODENOSCOPY (EGD);  Surgeon: Malissa Hippo, MD;  [facility];  Service: Endoscopy;  Laterality: N/A;  . Flexible sigmoidoscopy 08/06/2011    Procedure: FLEXIBLE SIGMOIDOSCOPY;  Surgeon: Malissa Hippo, MD;  Location: [facility];  Service: Endoscopy;  Laterality: N/A;    No family history on file.  History  Substance Use Topics  . Smoking status: Former Smoker -- 2.0 packs/day for 40 years    Types: Cigarettes    Quit date: 07/06/2007  . Smokeless tobacco: Never Used  . Alcohol Use: No    OB  History    Grav Para Term Preterm Abortions TAB SAB Ect Mult Living                  Review of Systems  Constitutional: Negative for fever and chills.  Eyes: Positive for pain and redness. Negative for photophobia, discharge, itching and visual disturbance.  All other systems reviewed and are negative.    Allergies  Tape  Home Medications   Current Outpatient Rx  Name Route Sig Dispense Refill  . ALBUTEROL SULFATE (2.5 MG/3ML) 0.083% IN NEBU Nebulization Take 2.5 mg by nebulization every 4 (four) hours as needed. For shortness of breath     . ALBUTEROL 90 MCG/ACT IN AERS Inhalation Inhale 2 puffs into the lungs every 4 (four) hours as needed. For shortness of breath    . ASPIRIN EC 81 MG PO TBEC Oral Take 81 mg by mouth daily.     Marland Kitchen CLORAZEPATE DIPOTASSIUM 7.5 MG PO TABS Oral Take 7.5 mg by mouth 3 (three) times daily.      Marland Kitchen DILTIAZEM HCL ER COATED BEADS 300 MG PO CP24 Oral Take 300 mg by mouth daily.     Marland Kitchen ESOMEPRAZOLE MAGNESIUM 40 MG PO CPDR Oral Take 40 mg by mouth daily before breakfast.      . FENOFIBRATE 145 MG PO TABS Oral Take 145 mg by mouth daily.     Marland Kitchen  FUROSEMIDE 20 MG PO TABS Oral Take 20 mg by mouth daily as needed. Fluid retention    . LEVOTHYROXINE SODIUM 25 MCG PO TABS Oral Take 1 tablet (25 mcg total) by mouth daily before breakfast. 30 tablet 2  . NITROGLYCERIN 0.4 MG SL SUBL Sublingual Place 0.4 mg under the tongue every 5 (five) minutes as needed.      Marland Kitchen ROSUVASTATIN CALCIUM 20 MG PO TABS Oral Take 20 mg by mouth daily.       BP 172/96  Pulse 93  Temp 98.2 F (36.8 C)  Resp 20  SpO2 99%  Physical Exam  Nursing note and vitals reviewed. Constitutional: She is oriented to person, place, and time. She appears well-developed and well-nourished. No distress.  HENT:  Head: Normocephalic and atraumatic.  Eyes: EOM and lids are normal. Pupils are equal, round, and reactive to light. No foreign bodies found. Left eye exhibits no chemosis, no discharge, no  exudate and no hordeolum. No foreign body present in the left eye. Left conjunctiva is not injected. Left conjunctiva has no hemorrhage. No scleral icterus. Left eye exhibits normal extraocular motion and no nystagmus.       No visualized FB.  fluoroscein inserted but no stain uptake.  No indication of a corneal abrasion    Neck: Normal range of motion.  Cardiovascular: Normal rate, regular rhythm and normal heart sounds.   Pulmonary/Chest: Effort normal and breath sounds normal.  Abdominal: Soft. She exhibits no distension. There is no tenderness.  Musculoskeletal: Normal range of motion.  Neurological: She is alert and oriented to person, place, and time.  Skin: Skin is warm and dry.  Psychiatric: She has a normal mood and affect. Judgment normal.    ED Course  Procedures (including critical care time)  Labs Reviewed - No data to display No results found.   No diagnosis found. 1835-states she feels better after irrigation via morgan lens.  Wants to go home.   MDM  Pt sent home with acular and tobrex.  She is to f/u with ophthalm prn        Evalina Field, [place] 12/03/11 1334

## 2011-11-17 NOTE — ED Notes (Signed)
Left eye discomfort for 3 days, says she was lying on sofa and felt something got in her eye, Eye is red, Has not noticed  Any decrease in vision.  Increased tearing at times.

## 2011-11-17 NOTE — ED Notes (Signed)
Pt c/o FB in left eye for three days. Unsure of what is in her eye. Denies any injury.

## 2011-12-03 NOTE — ED Provider Notes (Signed)
Medical screening examination/treatment/procedure(s) were performed by non-physician practitioner and as supervising physician I was immediately available for consultation/collaboration.   Joya Gaskins, MD 12/03/11 [phone]

## 2011-12-11 ENCOUNTER — Other Ambulatory Visit: Payer: Self-pay | Admitting: Cardiology

## 2012-06-27 ENCOUNTER — Other Ambulatory Visit: Payer: Self-pay | Admitting: Cardiology

## 2012-07-05 ENCOUNTER — Ambulatory Visit: Payer: PRIVATE HEALTH INSURANCE | Admitting: Adult Health

## 2012-07-26 ENCOUNTER — Ambulatory Visit (INDEPENDENT_AMBULATORY_CARE_PROVIDER_SITE_OTHER): Payer: PRIVATE HEALTH INSURANCE | Admitting: Orthopedic Surgery

## 2012-07-26 ENCOUNTER — Encounter: Payer: Self-pay | Admitting: Orthopedic Surgery

## 2012-07-26 VITALS — BP 164/98 | Ht 65.0 in | Wt 225.0 lb

## 2012-07-26 DIAGNOSIS — M7532 Calcific tendinitis of left shoulder: Secondary | ICD-10-CM

## 2012-07-26 DIAGNOSIS — M7530 Calcific tendinitis of unspecified shoulder: Secondary | ICD-10-CM

## 2012-07-26 NOTE — Patient Instructions (Signed)
CA TENDONITIS

## 2012-07-26 NOTE — Progress Notes (Signed)
Patient ID: [name], female   DOB: [birth year], 62 y.o.   MRN: [id number] Chief Complaint  Patient presents with  . Shoulder Pain    Left shoulder pain, no injury. Refererred by Dr. [name].    History 62 year old female who with 3 week history of severe left shoulder pain radiating to the left elbow unresponsive to an injection in the biceps tendon area. She has a history of calcific tendinitis with similar pain last year which responded to injection  Complains of sharp burning 10 out of 10 constant pain loss of motion swelling and some numbness in the left arm pain came on suddenly without trauma  HISTORY REVIEWED IN EPIC   ROS + WT GAIN, MURMURS, SOB, WHEEZING, HEART BURN, ANXIETY, EASY BLEED AND BRUISE, TEMP INTOLERANCE, SEASONAL ALLERGY  ALL OTHER SYMPTOMS WERE NEGATIVE   BP 164/98  Ht 5\' 5"  (1.651 m)  Wt 225 lb (102.059 kg)  BMI 37.44 kg/m2 Physical Exam(12)  Vital signs:   GENERAL: normal development, MESOMORPH GROOMING NORMAL    CDV: pulses are normal   Skin: normal  Lymph: nodes were not palpable/normal  Psychiatric: awake, alert and oriented  Neuro: normal sensation  MSK  Gait: NORMAL 1 Inspection TENDER AND SPASM IN BICEPS, GLOBAL PERIDELTOID TENDERNES  2 Range of Motion LIMITED TO ARM AT THE SIDE  3 Motor CNA 4 Stability CNA  Other side:  5 NORMAL ROM  6 NORMAL STRENGTH   Imaging FROM [facility] 2013 CA TENDONITIS   Assessment: CA TENDONITIS     Plan: INJECTION SUBACROMIAL SPACE SLING  REST  RETURN 1 WEEKS

## 2012-08-02 ENCOUNTER — Ambulatory Visit (INDEPENDENT_AMBULATORY_CARE_PROVIDER_SITE_OTHER): Payer: PRIVATE HEALTH INSURANCE | Admitting: Orthopedic Surgery

## 2012-08-02 VITALS — BP 160/94 | Ht 65.0 in | Wt 225.0 lb

## 2012-08-02 DIAGNOSIS — M7532 Calcific tendinitis of left shoulder: Secondary | ICD-10-CM

## 2012-08-02 DIAGNOSIS — M7530 Calcific tendinitis of unspecified shoulder: Secondary | ICD-10-CM

## 2012-08-02 NOTE — Patient Instructions (Addendum)
You have received a steroid shot. 15% of patients experience increased pain at the injection site with in the next 24 hours. This is best treated with ice and tylenol extra strength 2 tabs every 8 hours. If you are still having pain please call the office.   Tennis elbow  Wear brace  Ice pack for 20 min 3 x a day  Apply Aspercreme 3 x day Tennis Elbow Your caregiver has diagnosed you with a condition often referred to as "tennis elbow." This results from small tears or soreness (inflammation) at the start (origin) of the extensor muscles of the forearm. Although the condition is often called tennis or golfer's elbow, it is caused by any repetitive action performed by your elbow. HOME CARE INSTRUCTIONS  If the condition has been short lived, rest may be the only treatment required. Using your opposite hand or arm to perform the task may help. Even changing your grip may help rest the extremity. These may even prevent the condition from recurring.  Longer standing problems, however, will often be relieved faster by:  Using anti-inflammatory agents.  Applying ice packs for 30 minutes at the end of the working day, at bed time, or when activities are finished.  Your caregiver may also have you wear a splint or sling. This will allow the inflamed tendon to heal. At times, steroid injections aided with a local anesthetic will be required along with splinting for 1 to 2 weeks. Two to three steroid injections will often solve the problem. In some long standing cases, the inflamed tendon does not respond to conservative (non-surgical) therapy. Then surgery may be required to repair it. MAKE SURE YOU:   Understand these instructions.  Will watch your condition.  Will get help right away if you are not doing well or get worse. Document Released: 03/02/2005 Document Revised: 05/25/2011 Document Reviewed: 10/19/2007 [facility] Patient Information 2013 [place], [place].

## 2012-08-03 ENCOUNTER — Encounter: Payer: Self-pay | Admitting: Orthopedic Surgery

## 2012-08-03 NOTE — Progress Notes (Signed)
Patient ID: [name], female   DOB: [birth year], 62 y.o.   MRN: [id number] Chief Complaint  Patient presents with  . Follow-up    recheck left shoulder    BP 160/94  Ht 5\' 5"  (1.651 m)  Wt 225 lb (102.059 kg)  BMI 37.44 kg/m2  One-week status post injection for calcific tendinitis 95% improvement according to the patient but she is asked about 60% improved with decreased pain improved range of motion.  She is a new complaint of pain over the lateral epicondyle with pain with wrist extension power grip maneuvers times one week.  Review of systems neck pain negative musculoskeletal, denies numbness and tingling neurological.  Vital signs as recorded appearance is normal she is oriented x3 mood is pleasant she ambulates normally  She has some tenderness around the shoulder and over the left elbow has normal range of motion of the elbow with painful wrist extension against resistance her range of motion in the shoulder has improved with 50 of external rotation 90 of abduction and 130 of forward elevation the shoulder remained stable the cuff remained strong skin is intact pulses are normal sensation remains normal as well  Impression #1 resolving calcific tendinitis recommend repeat injection  Impression #2 new problem tennis elbow recommend tennis elbow brace and topical muscle cream.  Return in 3-4 weeks  Subacromial Shoulder Injection Procedure Note  Pre-operative Diagnosis: left RC Syndrome  Post-operative Diagnosis: same  Indications: pain   Anesthesia: ethyl chloride   Procedure Details   Verbal consent was obtained for the procedure. The shoulder was prepped withalcohol and the skin was anesthetized. A 20 gauge needle was advanced into the subacromial space through posterior approach without difficulty  The space was then injected with 3 ml 1% lidocaine and 1 ml of depomedrol. The injection site was cleansed with isopropyl alcohol and a dressing was  applied.  Complications:  None; patient tolerated the procedure well.

## 2012-08-23 ENCOUNTER — Ambulatory Visit: Payer: PRIVATE HEALTH INSURANCE | Admitting: Orthopedic Surgery

## 2012-09-12 ENCOUNTER — Emergency Department (HOSPITAL_COMMUNITY): Payer: PRIVATE HEALTH INSURANCE

## 2012-09-12 ENCOUNTER — Encounter (HOSPITAL_COMMUNITY): Payer: Self-pay | Admitting: *Deleted

## 2012-09-12 ENCOUNTER — Emergency Department (HOSPITAL_COMMUNITY)
Admission: EM | Admit: 2012-09-12 | Discharge: 2012-09-12 | Disposition: A | Discharge status: Home / Self Care | Payer: PRIVATE HEALTH INSURANCE | Attending: Emergency Medicine | Admitting: Emergency Medicine

## 2012-09-12 DIAGNOSIS — Z91040 Latex allergy status: Secondary | ICD-10-CM | POA: Insufficient documentation

## 2012-09-12 DIAGNOSIS — R109 Unspecified abdominal pain: Secondary | ICD-10-CM | POA: Insufficient documentation

## 2012-09-12 DIAGNOSIS — Z8673 Personal history of transient ischemic attack (TIA), and cerebral infarction without residual deficits: Secondary | ICD-10-CM | POA: Insufficient documentation

## 2012-09-12 DIAGNOSIS — I10 Essential (primary) hypertension: Secondary | ICD-10-CM | POA: Insufficient documentation

## 2012-09-12 DIAGNOSIS — E785 Hyperlipidemia, unspecified: Secondary | ICD-10-CM | POA: Insufficient documentation

## 2012-09-12 DIAGNOSIS — Z79899 Other long term (current) drug therapy: Secondary | ICD-10-CM | POA: Insufficient documentation

## 2012-09-12 DIAGNOSIS — Z90710 Acquired absence of both cervix and uterus: Secondary | ICD-10-CM | POA: Insufficient documentation

## 2012-09-12 DIAGNOSIS — I2510 Atherosclerotic heart disease of native coronary artery without angina pectoris: Secondary | ICD-10-CM | POA: Insufficient documentation

## 2012-09-12 DIAGNOSIS — R011 Cardiac murmur, unspecified: Secondary | ICD-10-CM | POA: Insufficient documentation

## 2012-09-12 DIAGNOSIS — Z87891 Personal history of nicotine dependence: Secondary | ICD-10-CM | POA: Insufficient documentation

## 2012-09-12 DIAGNOSIS — J4489 Other specified chronic obstructive pulmonary disease: Secondary | ICD-10-CM | POA: Insufficient documentation

## 2012-09-12 DIAGNOSIS — J449 Chronic obstructive pulmonary disease, unspecified: Secondary | ICD-10-CM | POA: Insufficient documentation

## 2012-09-12 DIAGNOSIS — Z7982 Long term (current) use of aspirin: Secondary | ICD-10-CM | POA: Insufficient documentation

## 2012-09-12 DIAGNOSIS — E669 Obesity, unspecified: Secondary | ICD-10-CM | POA: Insufficient documentation

## 2012-09-12 HISTORY — DX: Cerebral infarction, unspecified: I63.9

## 2012-09-12 LAB — URINALYSIS, ROUTINE W REFLEX MICROSCOPIC
Bilirubin Urine: NEGATIVE
Glucose, UA: NEGATIVE mg/dL
pH: 5.5 (ref 5.0–8.0)

## 2012-09-12 NOTE — ED Notes (Signed)
Burning pain RLQ for 3 days, No rash,  Sl nausea, no vomiting.  No dysuria.  No injury

## 2012-09-12 NOTE — ED Notes (Signed)
States over 2-3 days has had "grabbing, burning pain" w/movement in RLQ.  No dysuria, no hematuria, diarrhea, nausea or vomiting noted. Pain does not radiate.  Tender to palpation RLQ.

## 2012-09-12 NOTE — ED Provider Notes (Signed)
History     This chart was scribed for [name] Lennert, MD, MD by Smitty Pluck, ED Scribe. The patient was seen in room [facility] and the patient's care was started at 6:15PM.  CSN: [id number] Arrival date & time 09/12/12  1724    Chief Complaint  Patient presents with  . Abdominal Pain    Patient is a 62 y.o. female presenting with abdominal pain. The history is provided by the patient and medical records. No language interpreter was used.  Abdominal Pain This is a new problem. The current episode started more than 2 days ago. The problem occurs constantly. The problem has not changed since onset.Associated symptoms include abdominal pain. Pertinent negatives include no chest pain and no headaches. Nothing relieves the symptoms. She has tried nothing for the symptoms.   HPI Comments: [name] is a 62 y.o. female who presents to the Emergency Department complaining of constant, moderate RLQ burning pain onset 3 days ago. Pt reports that movement aggravates the pain. She has not taken any medications PTA. Pt denies dysuria, trauma to abdomen, radiation, fever, chills, nausea, vomiting, diarrhea, weakness, cough, SOB and any other pain.   Past Medical History  Diagnosis Date  . COPD (chronic obstructive pulmonary disease)   . Hypertension   . Hyperlipidemia   . Obesity   . Chest tightness   . Tobacco use disorder     50 pack years; questionably discontinued in 2010  . 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 Heart murmur   . Coronary artery disease   . Carotid artery dissection     left  . Stroke    Past Surgical History  Procedure Laterality Date  . Splenectomy    . Colonoscopy  2011  . Abdominal hysterectomy    . Cholecystectomy    . Breast  excisional biopsy      Left  . Larynx surgery      Polyps excised  . Laparotomy    . Carotid stent    . Appendectomy    . Cardiac stent x 2    . Esophagogastroduodenoscopy  08/06/2011    Procedure: ESOPHAGOGASTRODUODENOSCOPY (EGD);  Surgeon: Malissa Hippo, MD;  [facility];  Service: Endoscopy;  Laterality: N/A;  . Flexible sigmoidoscopy  08/06/2011    Procedure: FLEXIBLE SIGMOIDOSCOPY;  Surgeon: Malissa Hippo, MD;  [facility];  Service: Endoscopy;  Laterality: N/A;   Family History  Problem Relation Age of Onset  . Heart disease    . Arthritis    . Cancer    . Diabetes    . Kidney disease     History  Substance Use Topics  . Smoking status: Former Smoker -- 2.00 packs/day for 40 years    Types: Cigarettes    Quit date: 07/06/2007  . Smokeless tobacco: Never Used  . Alcohol Use: No   OB History   Grav Para Term Preterm Abortions TAB SAB Ect Mult Living                 Review of Systems  Constitutional: Negative for appetite change and fatigue.  HENT: Negative for congestion, sinus pressure and ear discharge.   Eyes: Negative for discharge.  Respiratory: Negative for cough.   Cardiovascular: Negative for chest pain.  Gastrointestinal: Positive for abdominal  pain. Negative for diarrhea.  Genitourinary: Negative for frequency and hematuria.  Musculoskeletal: Negative for back pain.  Skin: Negative for rash.  Neurological: Negative for seizures and headaches.  Psychiatric/Behavioral: Negative for hallucinations.    Allergies  Latex and Tape  Home Medications   Current Outpatient Rx  Name  Route  Sig  Dispense  Refill  . albuterol (PROVENTIL) (2.5 MG/3ML) 0.083% nebulizer solution   Nebulization   Take 2.5 mg by nebulization every 4 (four) hours as needed. For shortness of breath          . albuterol (PROVENTIL,VENTOLIN) 90 MCG/ACT inhaler   Inhalation   Inhale 2 puffs into the lungs every 4 (four) hours as needed. For shortness of  breath         . aspirin EC 81 MG tablet   Oral   Take 81 mg by mouth every morning.          . clorazepate (TRANXENE) 7.5 MG tablet   Oral   Take 7.5 mg by mouth 3 (three) times daily.           Marland Kitchen diltiazem (CARDIZEM CD) 300 MG 24 hr capsule   Oral   Take 300 mg by mouth every morning.          Marland Kitchen esomeprazole (NEXIUM) 40 MG capsule   Oral   Take 40 mg by mouth daily before breakfast.           . fenofibrate (TRICOR) 145 MG tablet   Oral   Take 145 mg by mouth every morning.          . furosemide (LASIX) 20 MG tablet   Oral   Take 20 mg by mouth daily as needed. Fluid retention         . hydrochlorothiazide (HYDRODIURIL) 25 MG tablet   Oral   Take 25 mg by mouth every morning.          Marland Kitchen HYDROcodone-acetaminophen (NORCO) 10-325 MG per tablet   Oral   Take 1 tablet by mouth 3 (three) times daily as needed for pain.          Marland Kitchen levothyroxine (SYNTHROID, LEVOTHROID) 25 MCG tablet   Oral   Take 1 tablet (25 mcg total) by mouth daily before breakfast.   30 tablet   2   . losartan (COZAAR) 100 MG tablet   Oral   Take 100 mg by mouth every morning.          . rosuvastatin (CRESTOR) 20 MG tablet   Oral   Take 20 mg by mouth every morning.         . nitroGLYCERIN (NITROSTAT) 0.4 MG SL tablet   Sublingual   Place 0.4 mg under the tongue every 5 (five) minutes as needed.            BP 145/115  Pulse 85  Temp(Src) 97.4 F (36.3 C) (Oral)  Resp 16  Ht 5\' 6"  (1.676 m)  Wt 200 lb (90.719 kg)  BMI 32.3 kg/m2  SpO2 96% Physical Exam  Nursing note and vitals reviewed. Constitutional: She is oriented to person, place, and time. She appears well-developed.  HENT:  Head: Normocephalic.  Eyes: Conjunctivae and EOM are normal. No scleral icterus.  Neck: Neck supple. No thyromegaly present.  Cardiovascular: Normal rate and regular rhythm.  Exam reveals no gallop and no friction rub.   No murmur heard. Pulmonary/Chest: No stridor. She has no  wheezes. She has no rales. She exhibits no tenderness.  Abdominal: She exhibits no distension. There is no tenderness. There is no rebound.  Musculoskeletal: Normal range of motion. She exhibits no edema.  Lymphadenopathy:    She has no cervical adenopathy.  Neurological: She is oriented to person, place, and time. Coordination normal.  Skin: No rash noted. No erythema.  Psychiatric: She has a normal mood and affect. Her behavior is normal.    ED Course  Procedures (including critical care time) DIAGNOSTIC STUDIES: Oxygen Saturation is 96% on room air, adequate by my interpretation.    COORDINATION OF CARE: 6:18 PM Discussed ED treatment with pt and pt agrees.  7:36 PM Recheck: Discussed lab results and treatment course with pt. Pt is feeling better. Pt is ready for discharge.     Labs Reviewed  URINALYSIS, ROUTINE W REFLEX MICROSCOPIC - Abnormal; Notable for the following:    Ketones, ur TRACE (*)    All other components within normal limits   Dg Abd Acute W/chest  09/12/2012   *RADIOLOGY REPORT*  Clinical Data: Abdominal pain for 3 days, hypertension  ACUTE ABDOMEN SERIES (ABDOMEN 2 VIEW & CHEST 1 VIEW)  Comparison: 08/01/2011  Findings: Cardiomediastinal silhouette is stable.  No acute infiltrate or pleural effusion.  No pulmonary edema.  There is nonspecific nonobstructive bowel gas pattern.  Significant stool noted in the right colon and transverse colon.  Surgical clips are noted in the left upper quadrant.  No free abdominal air.  IMPRESSION: No acute disease within chest.  Nonspecific nonobstructive bowel gas pattern.  Significant stool and proximal colon.  No free abdominal air.   Original Report Authenticated By: Natasha Mead, M.D.   No diagnosis found.  MDM  Abdominal pain mild.  abd wall inflamation  The chart was scribed for me under my direct supervision.  I personally performed the history, physical, and medical decision making and all procedures in the evaluation of  this patient.[name] Lennert, MD 09/12/12 [phone]

## 2012-09-12 NOTE — ED Notes (Signed)
Discharge instructions given and reviewed with patient.  Patient verbalized understanding to follow up with PMD as needed for further care.  Patient ambulatory; discharged home in good condition.  Family accompanied discharge.

## 2012-09-13 ENCOUNTER — Encounter: Payer: Self-pay | Admitting: Orthopedic Surgery

## 2012-09-13 ENCOUNTER — Ambulatory Visit: Payer: PRIVATE HEALTH INSURANCE | Admitting: Orthopedic Surgery

## 2012-09-22 ENCOUNTER — Ambulatory Visit: Payer: PRIVATE HEALTH INSURANCE | Admitting: Orthopedic Surgery

## 2012-10-06 ENCOUNTER — Other Ambulatory Visit: Payer: Self-pay | Admitting: *Deleted

## 2012-10-06 ENCOUNTER — Ambulatory Visit (INDEPENDENT_AMBULATORY_CARE_PROVIDER_SITE_OTHER): Payer: PRIVATE HEALTH INSURANCE | Admitting: Orthopedic Surgery

## 2012-10-06 ENCOUNTER — Encounter: Payer: Self-pay | Admitting: Orthopedic Surgery

## 2012-10-06 VITALS — BP 134/85 | Ht 65.0 in | Wt 225.0 lb

## 2012-10-06 DIAGNOSIS — M25519 Pain in unspecified shoulder: Secondary | ICD-10-CM

## 2012-10-06 DIAGNOSIS — M75102 Unspecified rotator cuff tear or rupture of left shoulder, not specified as traumatic: Secondary | ICD-10-CM | POA: Insufficient documentation

## 2012-10-06 DIAGNOSIS — M67919 Unspecified disorder of synovium and tendon, unspecified shoulder: Secondary | ICD-10-CM

## 2012-10-06 DIAGNOSIS — M25512 Pain in left shoulder: Secondary | ICD-10-CM

## 2012-10-06 DIAGNOSIS — S43429A Sprain of unspecified rotator cuff capsule, initial encounter: Secondary | ICD-10-CM

## 2012-10-06 DIAGNOSIS — M75100 Unspecified rotator cuff tear or rupture of unspecified shoulder, not specified as traumatic: Secondary | ICD-10-CM | POA: Insufficient documentation

## 2012-10-06 MED ORDER — NABUMETONE 750 MG PO TABS: 750.0000 mg | ORAL_TABLET | Freq: Two times a day (BID) | ORAL | Status: DC

## 2012-10-06 NOTE — Patient Instructions (Addendum)
A Arthrogram will be scheduled for Left Shoulder

## 2012-10-06 NOTE — Progress Notes (Signed)
Patient ID: Antony Odea, female   DOB: 04/29/1950, 62 y.o.   MRN: [id number] Chief Complaint  Patient presents with  . Follow-up    3 week recheck on left elbow and left shoulder.    Review of systems as noted May 13  Patient reports pain in her upper arm over her deltoid now. She has some pain over the a.c. joint and upper shoulder and also and the lateral epicondyle but most of her pain now is in the deltoid insertion  BP 134/85  Ht 5\' 5"  (1.651 m)  Wt 225 lb (102.059 kg)  BMI 37.44 kg/m2  General exam is normal. Slightly overweight. Oriented x3. Mood normal. Left shoulder tenderness is actually over the deltoid insertion her range of motion is limited to 90 of forward elevation 80 of abduction she has normal external rotation the shoulder is stable skin is intact she has a good pulse and normal sensation  She has stents and has a previous aneurysm repair so will get an arthrogram to rule out a rotator cuff tear if her cuff is normal in she's been treated for bursitis and impingement

## 2012-10-10 ENCOUNTER — Telehealth: Payer: Self-pay | Admitting: Radiology

## 2012-10-10 NOTE — Telephone Encounter (Signed)
Patient has an Arthrogram appointment at [facility] on 10-26-12 at 10:45. The patient will follow up back here in the office for her results.

## 2012-10-18 ENCOUNTER — Other Ambulatory Visit (HOSPITAL_COMMUNITY): Payer: PRIVATE HEALTH INSURANCE

## 2012-10-24 LAB — CREATININE, SERUM: Creat: 0.79 mg/dL (ref 0.50–1.10)

## 2012-10-26 ENCOUNTER — Inpatient Hospital Stay (HOSPITAL_COMMUNITY): Admission: RE | Admit: 2012-10-26 | Payer: PRIVATE HEALTH INSURANCE | Source: Ambulatory Visit

## 2012-10-27 ENCOUNTER — Other Ambulatory Visit: Payer: Self-pay | Admitting: Orthopedic Surgery

## 2012-10-27 ENCOUNTER — Ambulatory Visit (HOSPITAL_COMMUNITY)
Admission: RE | Admit: 2012-10-27 | Discharge: 2012-10-27 | Disposition: A | Discharge status: Home / Self Care | Payer: PRIVATE HEALTH INSURANCE | Source: Ambulatory Visit | Attending: Orthopedic Surgery | Admitting: Orthopedic Surgery

## 2012-10-27 DIAGNOSIS — M25519 Pain in unspecified shoulder: Secondary | ICD-10-CM | POA: Insufficient documentation

## 2012-10-27 DIAGNOSIS — M75102 Unspecified rotator cuff tear or rupture of left shoulder, not specified as traumatic: Secondary | ICD-10-CM

## 2012-10-27 DIAGNOSIS — R29898 Other symptoms and signs involving the musculoskeletal system: Secondary | ICD-10-CM | POA: Insufficient documentation

## 2012-10-27 MED ORDER — LIDOCAINE HCL (PF) 1 % IJ SOLN
INTRAMUSCULAR | Status: AC
  Filled 2012-10-27: qty 5

## 2012-10-27 MED ORDER — IOHEXOL 300 MG/ML  SOLN
50.0000 mL | Freq: Once | INTRAMUSCULAR | Status: AC | PRN
  Administered 2012-10-27: 12 mL via INTRAVENOUS

## 2012-11-01 ENCOUNTER — Ambulatory Visit: Payer: PRIVATE HEALTH INSURANCE | Admitting: Orthopedic Surgery

## 2012-11-02 ENCOUNTER — Encounter: Payer: Self-pay | Admitting: Orthopedic Surgery

## 2012-11-10 ENCOUNTER — Ambulatory Visit (INDEPENDENT_AMBULATORY_CARE_PROVIDER_SITE_OTHER): Payer: PRIVATE HEALTH INSURANCE | Admitting: Orthopedic Surgery

## 2012-11-10 VITALS — BP 150/100 | Ht 65.0 in | Wt 225.0 lb

## 2012-11-10 DIAGNOSIS — M7532 Calcific tendinitis of left shoulder: Secondary | ICD-10-CM

## 2012-11-10 DIAGNOSIS — M7530 Calcific tendinitis of unspecified shoulder: Secondary | ICD-10-CM

## 2012-11-10 MED ORDER — MENTHOL (TOPICAL ANALGESIC) 4 % EX GEL: CUTANEOUS | Status: DC

## 2012-11-10 NOTE — Progress Notes (Signed)
Patient ID: [name], female   DOB: 04/27/1950, 62 y.o.   MRN: [id number]  Chief Complaint  Patient presents with  . Follow-up    Arthrogram results   Encounter Diagnosis  Name Primary?  . Tendonitis, calcific, shoulder, left Yes   The patient does not have rotator cuff tear she is calcific tendinitis in that has actually improved by decreased amount of calcium deposits seen on arthrogram however now her pain is at the deltoid insertion with point tenderness there. Most likely she's overworked the deltoid from the rotator cuff problems and now has pain and inflammation air which is not resolved by 20 mg of hydrocodone. I don't think the pain medicine will really work for this I sent her for set her up today for some ultrasound treatment with hydrocortisone or betamethasone cream  Followup 4 weeks consider injecting the area if no improvement

## 2012-11-10 NOTE — Patient Instructions (Addendum)
Pt biofreeze three times a day

## 2012-11-23 ENCOUNTER — Ambulatory Visit (HOSPITAL_COMMUNITY)
Admission: RE | Admit: 2012-11-23 | Discharge: 2012-11-23 | Disposition: A | Discharge status: Home / Self Care | Payer: PRIVATE HEALTH INSURANCE | Source: Ambulatory Visit | Attending: Orthopedic Surgery | Admitting: Orthopedic Surgery

## 2012-11-23 DIAGNOSIS — J449 Chronic obstructive pulmonary disease, unspecified: Secondary | ICD-10-CM | POA: Insufficient documentation

## 2012-11-23 DIAGNOSIS — M25512 Pain in left shoulder: Secondary | ICD-10-CM

## 2012-11-23 DIAGNOSIS — IMO0001 Reserved for inherently not codable concepts without codable children: Secondary | ICD-10-CM | POA: Insufficient documentation

## 2012-11-23 DIAGNOSIS — I10 Essential (primary) hypertension: Secondary | ICD-10-CM | POA: Insufficient documentation

## 2012-11-23 DIAGNOSIS — M25519 Pain in unspecified shoulder: Secondary | ICD-10-CM | POA: Insufficient documentation

## 2012-11-23 DIAGNOSIS — J4489 Other specified chronic obstructive pulmonary disease: Secondary | ICD-10-CM | POA: Insufficient documentation

## 2012-11-23 DIAGNOSIS — M6281 Muscle weakness (generalized): Secondary | ICD-10-CM | POA: Insufficient documentation

## 2012-11-23 NOTE — Evaluation (Signed)
Occupational Therapy Evaluation  Patient Details  Name: [name] MRN: [id number] Date of Birth: [birth year]  Today's Date: 11/23/2012 Time: 4696-2952 OT Time Calculation (min): 35 min OT Evaluation 1440-1600  20' Manual therapy 8413-2440 15' Visit#: 1 of 12  Re-eval: 12/21/12  Assessment Diagnosis: Left Deltoid Tendonitis Prior Therapy: n/a  Authorization: [employer] Medicare  Authorization Time Period: before 10th visit  Authorization Visit#: 1 of 10   Past Medical History:  Past Medical History  Diagnosis Date  . COPD (chronic obstructive pulmonary disease)   . Hypertension   . Hyperlipidemia   . Obesity   . Chest tightness   . Tobacco use disorder     50 pack years; questionably discontinued in 2010  . 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 Heart murmur   . Coronary artery disease   . Carotid artery dissection     left  . Stroke    Past Surgical History:  Past Surgical History  Procedure Laterality Date  . Splenectomy    . Colonoscopy  2011  . Abdominal hysterectomy    . Cholecystectomy    . Breast excisional biopsy      Left  . Larynx surgery      Polyps excised  . Laparotomy    . Carotid stent    . Appendectomy    . Cardiac stent x 2    . Esophagogastroduodenoscopy  08/06/2011    Procedure: ESOPHAGOGASTRODUODENOSCOPY (EGD);  Surgeon: Malissa Hippo, MD;  Location: [facility];  Service: Endoscopy;  Laterality: N/A;  . Flexible sigmoidoscopy  08/06/2011    Procedure: FLEXIBLE SIGMOIDOSCOPY;  Surgeon: Malissa Hippo, MD;  Location: [facility];  Service: Endoscopy;  Laterality: N/A;    Subjective S:  My shoulder was hurting, and now that pain is gone.  It's just in my upper arm now (deltoid insertion) Pertinent History: Ms. Deas  began experiencing pain in her left shoulder approximately 4 months ago.  She consulted with her PCP and was referred to Dr. Romeo Apple.  She received cortisone injections in her shoulder, and the pain was relieved.  She is now experiencing pain in the deltoid insertion area.  She has been referred to occupational therapy for evaluation and treatment and ultrasound/iontophoresis. Special Tests: FOTO scored 67% Independent. Patient Stated Goals: I want to get rid of the pain in my arm. Pain Assessment Currently in Pain?: Yes Pain Score: 8  Pain Location: Arm Pain Orientation: Upper;Lateral Pain Type: Acute pain  Precautions/Restrictions  Precautions Precautions: None Restrictions Weight Bearing Restrictions: No  Balance Screening Balance Screen Has the patient fallen in the past 6 months: No Has the patient had a decrease in activity level because of a fear of falling? : No Is the patient reluctant to leave their home because of a fear of falling? : No  Prior Functioning  Prior Function Driving: No Vocation: Retired Comments: Lives alone, enjoys caring for grandchildren and   Assessment ADL/Vision/Perception ADL ADL Comments: pain and numbness when peeling potatoes, pouring tea from a pitcher Dominant Hand: Right Vision - History Baseline Vision: No visual deficits  Cognition/Observation Cognition Overall Cognitive Status: Within Functional Limits for tasks assessed  Sensation/Coordination/Edema Sensation Light Touch: Appears Intact Coordination Gross Motor Movements are Fluid and Coordinated: Yes Fine Motor Movements are Fluid and Coordinated: Yes  Additional  Assessments LUE AROM (degrees) LUE Overall AROM Comments: assessed in seated WFL LUE PROM (degrees) LUE Overall PROM Comments: assessed in seated WFl LUE Strength LUE Overall Strength Comments: 5/5 Grip (lbs): grip with elbow flexed 42# with elbow extended 45# (right grip with elbow flexed 75# with elbow  extended 75#) Palpation Palpation: moderate fascial restrictions in deltoid insertion and lateral forearm     Exercise/Treatments    Manual Therapy Manual Therapy: Myofascial release Myofascial Release: MFR to left lateral upper arm along deltoid insertion, and lateral forearm.   Occupational Therapy Assessment and Plan OT Assessment and Plan Clinical Impression Statement: A: Patient presents with increased pain in her left arm and decreased grip strength.  These deficits are causing decreased I with activities such as peeling potatoes and pouring drinks.  Pt will benefit from skilled therapeutic intervention in order to improve on the following deficits: Pain;Increased muscle spasms;Increased fascial restricitons;Decreased strength Rehab Potential: Good OT Frequency: Min 2X/week OT Duration: 6 weeks OT Treatment/Interventions: Self-care/ADL training;Therapeutic exercise;Manual therapy;Therapeutic activities;Modalities OT Plan: P:  Skilled OT intervention to decrease pain and increase grip strength in left arm, needed for return to prior level of function with B/IADLs. Treatment Plan:  MFR to left deltoid insertion area and lateral forearm, nerve glides, wall wash, scapular stability, grip strengthening.  If no relief from pain in 3 visits, add [name] / iontophoresis.    Goals Short Term Goals Time to Complete Short Term Goals: 3 weeks Short Term Goal 1: Patient will be educated on a HEP. Short Term Goal 2: Patient will increase left grip strength to 50 pounds for increased ability to peel potatoes. Short Term Goal 3: Patient will decrease pain in her left arm to 5/10 during functional activities.  Short Term Goal 4: Patient will decrease fascial restrictions to min-mod. Long Term Goals Time to Complete Long Term Goals: 6 weeks Long Term Goal 1: Patient will return to prior level of I with all B/IADLs and leisure activities.  Long Term Goal 2: Patient will increase left grip strength to 65  pounds for increased ability to peel potatoes. Long Term Goal 3: Patient will decrease pain in her left arm to 2/10 during functional activities.  Long Term Goal 4: Patient will decrease fascial restrictions to min.  Problem List Patient Active Problem List   Diagnosis Date Noted  . Deltoid tendinitis of left shoulder 11/23/2012  . Rotator cuff tear 10/06/2012  . Rotator cuff syndrome of left shoulder 10/06/2012  . Pain in joint, shoulder region 10/06/2012  . Tendonitis, calcific, shoulder 07/26/2012  . CVA (cerebral infarction)   . COPD (chronic obstructive pulmonary disease)   . Hypertension   . Hyperlipidemia   . Obesity   . Chest tightness   . Tobacco use disorder     End of Session Activity Tolerance: Patient tolerated treatment well General Behavior During Therapy: WFL for tasks assessed/performed OT Plan of Care OT Home Exercise Plan: shoulder towel slides and green tputty for grip strengthening.  Consulted and Agree with Plan of Care: Patient  GO Functional Assessment Tool Used: FOTO scored 67% I and 33% impaired. Functional Limitation: Carrying, moving and handling objects Carrying, Moving and Handling Objects Current Status [id number]): At least 20 percent but less than 40 percent impaired, limited or restricted Carrying, Moving and Handling Objects Goal Status [id number]): At least 1 percent but less than 20 percent impaired, limited or restricted  Shirlean Mylar, OTR/L  11/23/2012, 4:57 PM  Physician Documentation Your signature is required to indicate  approval of the treatment plan as stated above.  Please sign and either send electronically or make a copy of this report for your files and return this physician signed original.  Please mark one 1.__approve of plan  2. ___approve of plan with the following conditions.   ______________________________                                                          _____________________ Physician Signature                                                                                                              Date

## 2012-12-05 ENCOUNTER — Ambulatory Visit (HOSPITAL_COMMUNITY)
Admission: RE | Admit: 2012-12-05 | Discharge: 2012-12-05 | Disposition: A | Discharge status: Home / Self Care | Payer: PRIVATE HEALTH INSURANCE | Source: Ambulatory Visit | Attending: Family Medicine | Admitting: Family Medicine

## 2012-12-05 NOTE — Progress Notes (Signed)
Occupational Therapy Treatment Patient Details  Name: [name] MRN: [id number] Date of Birth: [birth year]  Today's Date: 12/05/2012 Time: 4098-1191 OT Time Calculation (min): 38 min MFR 4782-9562 16' Ultrasound 1308-6578 8' Therex 4696-2952 14'  Visit#: 22 of 12  Re-eval: 12/21/12    Authorization: [employer] Medicare  Authorization Time Period: before 10th visit  Authorization Visit#: 2 of 10  Subjective Symptoms/Limitations Symptoms: S: I just want to know what exactly is going on with my arm. I was doing strengthening and massage to myself at home before coming to therapy and it didn't do any good.  Pain Assessment Currently in Pain?: Yes Pain Score: 8   Precautions/Restrictions  Precautions Precautions: None  Exercise/Treatments Supine Flexion: PROM;10 reps  Elbow Exercises Elbow Flexion: PROM;10 reps Elbow Extension: PROM;10 reps Forearm Supination: PROM;10 reps Forearm Pronation: PROM;10 reps         Modalities Modalities: Ultrasound Manual Therapy Manual Therapy: Myofascial release Myofascial Release: MFR and manual stretching to left lateral upper arm along deltoid insertion, and lateral forearm. Ultrasound Ultrasound Location: Left laterall upper arm along deltiod insertion Ultrasound Parameters: 1.2 w/cm2 ; 8' Ultrasound Goals: Pain  Occupational Therapy Assessment and Plan OT Assessment and Plan Clinical Impression Statement: A: Patient requested to try ultrasound this date. Patient inquired about iontophoresis as evaluating therapist had mentioned it. Educated patient on both modalities. Patient had increased tenderness during MFR. Patient complained of stiffness and muscle catching at times.  OT Plan: P: Cont with ultrasound for pain control. Add remainder AROM not completed.   Goals Short Term Goals Time to Complete Short Term Goals: 3 weeks Short Term Goal 1: Patient will be educated on a HEP. Short Term Goal 1 Progress: Progressing toward  goal Short Term Goal 2: Patient will increase left grip strength to 50 pounds for increased ability to peel potatoes. Short Term Goal 2 Progress: Progressing toward goal Short Term Goal 3: Patient will decrease pain in her left arm to 5/10 during functional activities.  Short Term Goal 3 Progress: Progressing toward goal Short Term Goal 4: Patient will decrease fascial restrictions to min-mod. Short Term Goal 4 Progress: Progressing toward goal Long Term Goals Time to Complete Long Term Goals: 6 weeks Long Term Goal 1: Patient will return to prior level of I with all B/IADLs and leisure activities.  Long Term Goal 1 Progress: Progressing toward goal Long Term Goal 2: Patient will increase left grip strength to 65 pounds for increased ability to peel potatoes. Long Term Goal 2 Progress: Progressing toward goal Long Term Goal 3: Patient will decrease pain in her left arm to 2/10 during functional activities.  Long Term Goal 3 Progress: Progressing toward goal Long Term Goal 4: Patient will decrease fascial restrictions to min. Long Term Goal 4 Progress: Progressing toward goal  Problem List Patient Active Problem List   Diagnosis Date Noted  . Deltoid tendinitis of left shoulder 11/23/2012  . Rotator cuff tear 10/06/2012  . Rotator cuff syndrome of left shoulder 10/06/2012  . Pain in joint, shoulder region 10/06/2012  . Tendonitis, calcific, shoulder 07/26/2012  . CVA (cerebral infarction)   . COPD (chronic obstructive pulmonary disease)   . Hypertension   . Hyperlipidemia   . Obesity   . Chest tightness   . Tobacco use disorder     End of Session Activity Tolerance: Patient tolerated treatment well General Behavior During Therapy: [facility] for tasks assessed/performed   Limmie Patricia, OTR/L,CBIS   12/05/2012, 4:38 PM

## 2012-12-08 ENCOUNTER — Encounter: Payer: Self-pay | Admitting: Orthopedic Surgery

## 2012-12-08 ENCOUNTER — Encounter: Payer: Self-pay | Admitting: *Deleted

## 2012-12-08 ENCOUNTER — Ambulatory Visit (INDEPENDENT_AMBULATORY_CARE_PROVIDER_SITE_OTHER): Payer: PRIVATE HEALTH INSURANCE | Admitting: Orthopedic Surgery

## 2012-12-08 VITALS — BP 155/83 | Ht 65.0 in | Wt 225.0 lb

## 2012-12-08 DIAGNOSIS — M67919 Unspecified disorder of synovium and tendon, unspecified shoulder: Secondary | ICD-10-CM

## 2012-12-08 NOTE — Progress Notes (Signed)
Patient ID: Antony Odea, female   DOB: 06/01/1950, 62 y.o.   MRN: [id number]  Chief Complaint  Patient presents with  . Follow-up    4 week follow up left deltoid    62 year old female initially treated for calcific tendinitis with injection with good results physical therapy with good result now has pain over the deltoid insertion which I believe is secondary to asynchrony and firing of her deltoid and rotator cuff. She's regained excellent range of motion still has tenderness over the deltoid insertion  Review of systems no numbness or tingling in the arm or hand denies neck pain at this point  Exam shows well-developed well-nourished female grooming and hygiene are intact she has tenderness over the deltoid with full passive range of motion her shoulders stable motor exam is normal skin is intact she has a good pulse  Impression deltoid tendinitis  Recommend injection  Injection left deltoid insertion Verbal consent Timeout to confirm left arm is injection site Medications:  Lidocaine 1% 3 cc  Depo-Medrol 40 mg per mL 1 mL  Alcohol and ethyl chloride were used to prepare the skin and then the injection was given at the maximal point of pain no complications were noted.  Follow up in a month continue therapy

## 2012-12-08 NOTE — Patient Instructions (Signed)
Continue therapy

## 2012-12-09 ENCOUNTER — Telehealth (HOSPITAL_COMMUNITY): Payer: Self-pay

## 2012-12-09 ENCOUNTER — Ambulatory Visit (HOSPITAL_COMMUNITY): Payer: PRIVATE HEALTH INSURANCE

## 2012-12-12 ENCOUNTER — Ambulatory Visit (HOSPITAL_COMMUNITY)
Admission: RE | Admit: 2012-12-12 | Discharge: 2012-12-12 | Disposition: A | Discharge status: Home / Self Care | Payer: PRIVATE HEALTH INSURANCE | Source: Ambulatory Visit | Attending: Family Medicine | Admitting: Family Medicine

## 2012-12-12 DIAGNOSIS — M7582 Other shoulder lesions, left shoulder: Secondary | ICD-10-CM

## 2012-12-12 NOTE — Progress Notes (Signed)
Occupational Therapy Treatment Patient Details  Name: [name] MRN: [id number] Date of Birth: 04-18-1950  Today's Date: 12/12/2012 Time: 4098-1191 OT Time Calculation (min): 36 min Manual Therapy [phone] 18' Therapeutic exercises [phone] 18' Visit#: 3 of 12  Re-eval: 12/21/12    Authorization: [employer] Medicare  Authorization Time Period: before 10th visit  Authorization Visit#: 3 of 10  Subjective  S:  Its feeling so much better now that I have gotten the cortisone injection. Pain Assessment Currently in Pain?: No/denies Pain Score: 0-No pain  Precautions/Restrictions   progress as tolerated  Exercise/Treatments Seated Protraction: AROM;10 reps External Rotation: AROM;10 reps Internal Rotation: AROM;10 reps Flexion: AROM;10 reps Abduction: AROM;5 reps;Limitations ABduction Limitations: attempted to complete 10 and was able to complete 5 prior to experiencing pian.   Therapy Ball Flexion: 20 reps ABduction:  (attempted and declined due to pain level)  Elbow Exercises Elbow Flexion: AROM;10 reps;Limitations Elbow Flexion Limitations: able to do 7 of 10 repetitions Elbow Extension: AROM;10 reps;Limitations Elbow Extension Limitations: able to do 7 of 10 repetitions Forearm Supination: AROM;10 reps Forearm Pronation: AROM;10 reps Wrist Flexion: AROM;10 reps Wrist Extension: AROM;10 reps Other elbow exercises: educated on median nerve and ulnar nerve glides, completed 3 sets of each glide with patient.         Manual Therapy Manual Therapy: Myofascial release Myofascial Release: MFR and manual stretching to left lateral upper arm along deltoid insertion, and lateral forearm.  Occupational Therapy Assessment and Plan OT Assessment and Plan Clinical Impression Statement: A:  Patient presented with significantly less pain this date.  Patient could not complete abduction, however, due to pain produced during exercise.  Educated on HEP for ulnar nerve glides and  median nerve glides.   OT Plan: P:  Reassess, follow up on compliance with ulnar and median nerve glides, attempt to complete abduction in seated and with ball stretch.    Goals Short Term Goals Time to Complete Short Term Goals: 3 weeks Short Term Goal 1: Patient will be educated on a HEP. Short Term Goal 1 Progress: Progressing toward goal Short Term Goal 2: Patient will increase left grip strength to 50 pounds for increased ability to peel potatoes. Short Term Goal 2 Progress: Progressing toward goal Short Term Goal 3: Patient will decrease pain in her left arm to 5/10 during functional activities.  Short Term Goal 3 Progress: Progressing toward goal Short Term Goal 4: Patient will decrease fascial restrictions to min-mod. Short Term Goal 4 Progress: Progressing toward goal Long Term Goals Time to Complete Long Term Goals: 6 weeks Long Term Goal 1: Patient will return to prior level of I with all B/IADLs and leisure activities.  Long Term Goal 1 Progress: Progressing toward goal Long Term Goal 2: Patient will increase left grip strength to 65 pounds for increased ability to peel potatoes. Long Term Goal 2 Progress: Progressing toward goal Long Term Goal 3: Patient will decrease pain in her left arm to 2/10 during functional activities.  Long Term Goal 3 Progress: Progressing toward goal Long Term Goal 4: Patient will decrease fascial restrictions to min. Long Term Goal 4 Progress: Progressing toward goal  Problem List Patient Active Problem List   Diagnosis Date Noted  . Deltoid tendinitis of left shoulder 11/23/2012  . Rotator cuff tear 10/06/2012  . Rotator cuff syndrome of left shoulder 10/06/2012  . Pain in joint, shoulder region 10/06/2012  . Tendonitis, calcific, shoulder 07/26/2012  . CVA (cerebral infarction)   . COPD (chronic obstructive pulmonary disease)   .  Hypertension   . Hyperlipidemia   . Obesity   . Chest tightness   . Tobacco use disorder     End of  Session Activity Tolerance: Patient tolerated treatment well General Behavior During Therapy: WFL for tasks assessed/performed OT Plan of Care OT Home Exercise Plan: ulnar nerve glides and median nerve glides  GO    Shirlean Mylar, OTR/L  12/12/2012, 10:15 AM

## 2012-12-14 ENCOUNTER — Ambulatory Visit (HOSPITAL_COMMUNITY): Payer: PRIVATE HEALTH INSURANCE | Admitting: Specialist

## 2013-01-10 ENCOUNTER — Ambulatory Visit: Payer: PRIVATE HEALTH INSURANCE | Admitting: Orthopedic Surgery

## 2013-01-17 ENCOUNTER — Ambulatory Visit: Payer: PRIVATE HEALTH INSURANCE | Admitting: Orthopedic Surgery

## 2013-02-14 ENCOUNTER — Ambulatory Visit: Payer: PRIVATE HEALTH INSURANCE | Admitting: Orthopedic Surgery

## 2013-03-28 ENCOUNTER — Ambulatory Visit (INDEPENDENT_AMBULATORY_CARE_PROVIDER_SITE_OTHER): Payer: PRIVATE HEALTH INSURANCE | Admitting: Orthopedic Surgery

## 2013-03-28 ENCOUNTER — Other Ambulatory Visit (HOSPITAL_COMMUNITY): Payer: Self-pay | Admitting: Family Medicine

## 2013-03-28 VITALS — BP 169/94 | Ht 65.0 in | Wt 225.0 lb

## 2013-03-28 DIAGNOSIS — M719 Bursopathy, unspecified: Secondary | ICD-10-CM

## 2013-03-28 DIAGNOSIS — R921 Mammographic calcification found on diagnostic imaging of breast: Secondary | ICD-10-CM

## 2013-03-28 DIAGNOSIS — M778 Other enthesopathies, not elsewhere classified: Secondary | ICD-10-CM

## 2013-03-28 DIAGNOSIS — M7582 Other shoulder lesions, left shoulder: Secondary | ICD-10-CM

## 2013-03-28 DIAGNOSIS — M67919 Unspecified disorder of synovium and tendon, unspecified shoulder: Secondary | ICD-10-CM

## 2013-03-28 DIAGNOSIS — M7530 Calcific tendinitis of unspecified shoulder: Secondary | ICD-10-CM

## 2013-03-28 NOTE — Progress Notes (Signed)
Patient ID: [name], female   DOB: [birth year], 63 y.o.   MRN: [id number] Chief Complaint  Patient presents with  . Follow-up    1 month recheck on left deltoid after therapy.    Followup after calcific tendinitis treatment with injection and physical therapy for pain along the deltoid insertion she also has some tennis elbow symptoms  She is using a topical anti-inflammatory cream doing well. Her forward elevation is improved 150 she has good strength in her rotator cuff she is allowed to resume normal activities continue home exercises and topical treatment follow up as needed

## 2013-04-12 ENCOUNTER — Other Ambulatory Visit (HOSPITAL_COMMUNITY): Payer: Self-pay | Admitting: Family Medicine

## 2013-04-12 ENCOUNTER — Ambulatory Visit (HOSPITAL_COMMUNITY)
Admission: RE | Admit: 2013-04-12 | Discharge: 2013-04-12 | Disposition: A | Discharge status: Home / Self Care | Payer: PRIVATE HEALTH INSURANCE | Source: Ambulatory Visit | Attending: Family Medicine | Admitting: Family Medicine

## 2013-04-12 VITALS — BP 150/99 | HR 93 | Resp 16

## 2013-04-12 DIAGNOSIS — R921 Mammographic calcification found on diagnostic imaging of breast: Secondary | ICD-10-CM

## 2013-04-12 DIAGNOSIS — R928 Other abnormal and inconclusive findings on diagnostic imaging of breast: Secondary | ICD-10-CM | POA: Insufficient documentation

## 2013-04-12 DIAGNOSIS — N6039 Fibrosclerosis of unspecified breast: Secondary | ICD-10-CM | POA: Insufficient documentation

## 2013-04-12 DIAGNOSIS — N6320 Unspecified lump in the left breast, unspecified quadrant: Secondary | ICD-10-CM

## 2013-04-12 DIAGNOSIS — N644 Mastodynia: Secondary | ICD-10-CM | POA: Insufficient documentation

## 2013-04-12 MED ORDER — LIDOCAINE HCL (PF) 2 % IJ SOLN
INTRAMUSCULAR | Status: AC
  Filled 2013-04-12: qty 10

## 2013-04-12 MED ORDER — LIDOCAINE HCL (PF) 2 % IJ SOLN
10.0000 mL | Freq: Once | INTRAMUSCULAR | Status: AC
Start: 2013-04-12 — End: 2013-04-12
  Administered 2013-04-12: 10 mL

## 2013-04-12 NOTE — Discharge Instructions (Signed)
Breast Biopsy °Care After °These instructions give you information on caring for yourself after your procedure. Your doctor may also give you more specific instructions. Call your doctor if you have any problems or questions after your procedure. °HOME CARE °· Only take medicine as told by your doctor. °· Do not take aspirin. °· Keep your sutures (stitches) dry when bathing. °· Protect the biopsy area. Do not let the area get bumped. °· Avoid activities that could pull the biopsy site open until your doctor approves. This includes: °· Stretching. °· Reaching. °· Exercise. °· Sports. °· Lifting more than 3lb. °· Continue your normal diet. °· Wear a good support bra for as long as told by your doctor. °· Change any bandages (dressings) as told by your doctor. °· Do not drink alcohol while taking pain medicine. °· Keep all doctor visits as told. Ask when your test results will be ready. Make sure you get your test results. °GET HELP RIGHT AWAY IF:  °· You have a fever. °· You have more bleeding (more than a small spot) from the biopsy site. °· You have trouble breathing. °· You have yellowish-white fluid (pus) coming from the biopsy site. °· You have redness, puffiness (swelling), or more pain in the biopsy site. °· You have a bad smell coming from the biopsy site. °· Your biopsy site opens after sutures, staples, or sticky strips have been removed. °· You have a rash. °· You need stronger medicine. °MAKE SURE YOU: °· Understand these instructions. °· Will watch your condition. °· Will get help right away if you are not doing well or get worse. °Document Released: 12/27/2008 Document Revised: 05/25/2011 Document Reviewed: 04/12/2011 °[employer]® Patient Information ©2014 [employer]. ° °Breast Biopsy °A breast biopsy is a test during which a sample of tissue is taken from your breast. The breast tissue is looked at under a microscope for cancer cells.  °BEFORE THE PROCEDURE °· Make plans to have someone drive you home  after the test. °· Do not smoke for 2 weeks before the test. Stop smoking, if you smoke. °· Do not drink alcohol for 24 hours before the test. °· Wear a good support bra to the test. °PROCEDURE  °You may be given one of the following: °· A medicine to numb the breast area (local anesthesia). °· A medicine to make you sleep (general anesthesia). °There are different types of breast biopsies. They include: °· Fine-needle aspiration. °· A needle is put into the breast lump. °· The needle takes out fluid and cells from the lump. °· Ultrasound imaging may be used to help find the lump and to put the needle it the right spot. °· Core-needle biopsy. °· A needle is put into the breast lump. °· The needle is put in your breast 3 6 times. °· The needle removes breast tissue. °· An ultrasound image or X-ray is often used to find the right spot to put in the needle. °· Stereotactic biopsy. °· X-rays and a computer are used to study X-ray pictures of the breast lump. °· The computer finds where the needle needs to be put into the breast. °· Tissue samples are taken out. °· Vacuum-assisted biopsy. °· A small cut (incision) is made in your breast. °· A biopsy device is put through the cut and into the breast tissue. °· The biopsy device draws abnormal breast tissue into the biopsy device. °· A large tissue sample is often removed. °· No stitches are needed. °· Ultrasound-guided core-needle   biopsy. °· Ultrasound imaging helps guide the needle into the area of the breast that is not normal. °· A cut is made in the breast. The needle is put in the needle. °· Tissue samples are taken out. °· Open biopsy. °· A large cut is made in the breast. °· Your doctor will try to remove the whole breast lump or as much as possible. °All tissue, fluid, or cell samples are looked at under a microscope.  °AFTER THE PROCEDURE °· You will be taken to an area to recover. You will be able to go home once you are doing well and are without  problems. °· You may have bruising on your breast. This is normal. °· A pressure bandage (dressing) may be put on your breast for 24 8 hours. This type of bandage is wrapped tightly around your chest. It helps stop fluid from building up underneath tissues. °Document Released: 05/25/2011 Document Reviewed: 05/25/2011 °Exit[employer]® Patient Information ©2014 [employer]. ° °

## 2013-04-12 NOTE — Progress Notes (Signed)
Biopsy complete no signs of distress  

## 2013-04-27 ENCOUNTER — Encounter (INDEPENDENT_AMBULATORY_CARE_PROVIDER_SITE_OTHER): Payer: Self-pay | Admitting: General Surgery

## 2013-04-27 ENCOUNTER — Encounter (INDEPENDENT_AMBULATORY_CARE_PROVIDER_SITE_OTHER): Payer: Self-pay

## 2013-04-27 ENCOUNTER — Ambulatory Visit (INDEPENDENT_AMBULATORY_CARE_PROVIDER_SITE_OTHER): Payer: PRIVATE HEALTH INSURANCE | Admitting: General Surgery

## 2013-04-27 VITALS — BP 154/96 | HR 86 | Resp 22 | Ht 66.0 in | Wt 224.8 lb

## 2013-04-27 DIAGNOSIS — N63 Unspecified lump in unspecified breast: Secondary | ICD-10-CM

## 2013-04-27 DIAGNOSIS — N6320 Unspecified lump in the left breast, unspecified quadrant: Secondary | ICD-10-CM

## 2013-04-27 NOTE — Patient Instructions (Signed)
Will get cardiac clearance then plan for left breast wire localized lumpectomy

## 2013-04-27 NOTE — Progress Notes (Signed)
Patient ID: [name], female   DOB: September 08, 1950, 63 y.o.   MRN: [id number]  No chief complaint on file.   HPI [name] is a 63 y.o. female.  We are asked to see the patient in consultation by Dr. Wandalee Ferdinand to evaluate her for a left breast complex sclerosing lesion. The patient is a 63 year old white female who has had pain in her left breast for a number of years. She denies any discharge from her nipple. She recently went for a routine screening mammogram at which time an abnormality was seen in the lateral aspect of the left breast. This was biopsied and came back as a complex sclerosing lesion.  HPI  Past Medical History  Diagnosis Date  . COPD (chronic obstructive pulmonary disease)   . Hypertension   . Hyperlipidemia   . Obesity   . Chest tightness   . Tobacco use disorder     50 pack years; questionably discontinued in 2010  . 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 Heart murmur   . Coronary artery disease   . Carotid artery dissection     left  . Stroke     Past Surgical History  Procedure Laterality Date  . Splenectomy    . Colonoscopy  2011  . Abdominal hysterectomy    . Cholecystectomy    . Breast excisional biopsy      Left  . Larynx surgery      Polyps excised  . Laparotomy    . Carotid stent    . Appendectomy    . Cardiac stent x 2    . Esophagogastroduodenoscopy  08/06/2011    Procedure: ESOPHAGOGASTRODUODENOSCOPY (EGD);  Surgeon: Rogene Houston, MD;  [facility];  Service: Endoscopy;  Laterality: N/A;  . Flexible sigmoidoscopy  08/06/2011    Procedure: FLEXIBLE SIGMOIDOSCOPY;  Surgeon: Rogene Houston, MD;  [facility];  Service: Endoscopy;  Laterality: N/A;    Family History  Problem Relation Age of  Onset  . Heart disease    . Arthritis    . Cancer    . Diabetes    . Kidney disease      Social History History  Substance Use Topics  . Smoking status: Former Smoker -- 2.00 packs/day for 40 years    Types: Cigarettes    Quit date: 07/06/2007  . Smokeless tobacco: Never Used  . Alcohol Use: No    Allergies  Allergen Reactions  . Latex   . Tape Other (See Comments)    Causes blisters to form    Current Outpatient Prescriptions  Medication Sig Dispense Refill  . albuterol (PROVENTIL) (2.5 MG/3ML) 0.083% nebulizer solution Take 2.5 mg by nebulization every 4 (four) hours as needed. For shortness of breath       . albuterol (PROVENTIL,VENTOLIN) 90 MCG/ACT inhaler Inhale 2 puffs into the lungs every 4 (four) hours as needed. For shortness of breath      . aspirin EC 81 MG tablet Take 81 mg by mouth every morning.       . clorazepate (TRANXENE) 7.5 MG tablet Take 7.5 mg by mouth 3 (three) times daily.        Marland Kitchen diltiazem (CARDIZEM CD) 300 MG 24 hr capsule Take 300 mg by mouth every morning.       Marland Kitchen  esomeprazole (NEXIUM) 40 MG capsule Take 40 mg by mouth daily before breakfast.        . fenofibrate (TRICOR) 145 MG tablet Take 145 mg by mouth every morning.       . furosemide (LASIX) 20 MG tablet Take 20 mg by mouth daily as needed. Fluid retention      . hydrochlorothiazide (HYDRODIURIL) 25 MG tablet Take 25 mg by mouth every morning.       Marland Kitchen HYDROcodone-acetaminophen (NORCO) 10-325 MG per tablet Take 1 tablet by mouth 3 (three) times daily as needed for pain.       Marland Kitchen losartan (COZAAR) 100 MG tablet Take 100 mg by mouth every morning.       . Menthol, Topical Analgesic, (BIOFREEZE ROLL-ON) 4 % GEL Apply three times a day  1 Tube  5  . nabumetone (RELAFEN) 750 MG tablet Take 1 tablet (750 mg total) by mouth 2 (two) times daily.  60 tablet  2  . nitroGLYCERIN (NITROSTAT) 0.4 MG SL tablet Place 0.4 mg under the tongue every 5 (five) minutes as needed.        . rosuvastatin (CRESTOR) 20  MG tablet Take 20 mg by mouth every morning.      Marland Kitchen levothyroxine (SYNTHROID, LEVOTHROID) 25 MCG tablet Take 1 tablet (25 mcg total) by mouth daily before breakfast.  30 tablet  2   No current facility-administered medications for this visit.    Review of Systems Review of Systems  Constitutional: Negative.   HENT: Negative.   Eyes: Negative.   Respiratory: Positive for shortness of breath.   Cardiovascular: Negative.   Gastrointestinal: Negative.   Endocrine: Negative.   Genitourinary: Negative.   Musculoskeletal: Negative.   Skin: Negative.   Allergic/Immunologic: Negative.   Neurological: Negative.   Hematological: Negative.   Psychiatric/Behavioral: Negative.     Blood pressure 154/96, pulse 86, resp. rate 22, height 5\' 6"  (1.676 m), weight 224 lb 12.8 oz (101.969 kg).  Physical Exam Physical Exam  Constitutional: She is oriented to person, place, and time. She appears well-developed and well-nourished.  HENT:  Head: Normocephalic and atraumatic.  Eyes: Conjunctivae and EOM are normal. Pupils are equal, round, and reactive to light.  Neck: Normal range of motion. Neck supple.  Cardiovascular: Normal rate, regular rhythm and normal heart sounds.   Pulmonary/Chest: Effort normal and breath sounds normal.  There is no palpable mass in either breast. There is no palpable axillary, supraclavicular, or cervical lymphadenopathy  Abdominal: Soft. Bowel sounds are normal.  Musculoskeletal: Normal range of motion.  Lymphadenopathy:    She has no cervical adenopathy.  Neurological: She is alert and oriented to person, place, and time.  Skin: Skin is warm and dry.  Psychiatric: She has a normal mood and affect. Her behavior is normal.    Data Reviewed As above  Assessment    The patient appears to have a complex sclerosing lesion in the lateral left breast. Although this is benign it is considered a high risk lesion and does increase her risk for breast cancer during her  lifetime. Because of this the recommendation is that she have this area removed. This will require a wire localized lumpectomy. I've discussed with her in detail the risks and benefits of the operation to do this as well as some of the technical aspects and she understands and wishes to proceed. Given her cardiac history I think she will require cardiac clearance prior to scheduling surgery.     Plan  Plan for cardiac clearance and then possible wire localized left breast lumpectomy        TOTH III,Maggi Hershkowitz S 04/27/2013, 4:09 PM

## 2013-05-12 ENCOUNTER — Institutional Professional Consult (permissible substitution): Payer: PRIVATE HEALTH INSURANCE | Admitting: Cardiovascular Disease

## 2013-05-15 ENCOUNTER — Ambulatory Visit (INDEPENDENT_AMBULATORY_CARE_PROVIDER_SITE_OTHER): Payer: PRIVATE HEALTH INSURANCE | Admitting: Cardiovascular Disease

## 2013-05-15 ENCOUNTER — Encounter: Payer: Self-pay | Admitting: Cardiovascular Disease

## 2013-05-15 VITALS — BP 136/100 | HR 86 | Ht 66.0 in | Wt 222.0 lb

## 2013-05-15 DIAGNOSIS — R0789 Other chest pain: Secondary | ICD-10-CM

## 2013-05-15 DIAGNOSIS — I10 Essential (primary) hypertension: Secondary | ICD-10-CM

## 2013-05-15 DIAGNOSIS — E785 Hyperlipidemia, unspecified: Secondary | ICD-10-CM

## 2013-05-15 NOTE — Progress Notes (Signed)
[name] Date of Birth  September 05, 1950       [facility] [address], [address], [place], [place]  [address]   [place], [place]  [address] [place]   Fax  [phone]     Fax [phone]  Problem List: 1. Coronary artery disease - myoview at [facility] in 2012  2. History of CVA 3. COPD 4. Hypertension 5. Hyperlipidemia 6. Obesity 7. Breast cancer  History of Present Illness:  [name] is a 63 yo who is referred here for pre-op evaluation prior to breast surgery.  She had a myoview at [facility] in 2012 ( results are not available at this time).    She was accompanied by a Education officer, museum today.  I'm not sure if all of her history is reliable.  She did not remember   details about her medical history.   She has some episodes of CP - takes NTG every 3-4 months.  NTG eases the pain very quickly.   She walks to her mail box and does housework.    She gets short of breath with housework but typically does not have angina.    She still smokes.   She is unable to walk on a treadmill because of her severe COPD.   Current Outpatient Prescriptions on File Prior to Visit  Medication Sig Dispense Refill  . albuterol (PROVENTIL) (2.5 MG/3ML) 0.083% nebulizer solution Take 2.5 mg by nebulization every 4 (four) hours as needed. For shortness of breath       . albuterol (PROVENTIL,VENTOLIN) 90 MCG/ACT inhaler Inhale 2 puffs into the lungs every 4 (four) hours as needed. For shortness of breath      . aspirin EC 81 MG tablet Take 81 mg by mouth every morning.       . clorazepate (TRANXENE) 7.5 MG tablet Take 7.5 mg by mouth 3 (three) times daily.        Marland Kitchen diltiazem (CARDIZEM CD) 300 MG 24 hr capsule Take 300 mg by mouth every morning.       Marland Kitchen esomeprazole (NEXIUM) 40 MG capsule Take 40 mg by mouth daily before breakfast.        . fenofibrate (TRICOR) 145 MG tablet Take 145 mg by mouth every morning.       .  furosemide (LASIX) 20 MG tablet Take 20 mg by mouth daily as needed. Fluid retention      . hydrochlorothiazide (HYDRODIURIL) 25 MG tablet Take 25 mg by mouth every morning.       Marland Kitchen HYDROcodone-acetaminophen (NORCO) 10-325 MG per tablet Take 1 tablet by mouth 3 (three) times daily as needed for pain.       Marland Kitchen losartan (COZAAR) 100 MG tablet Take 100 mg by mouth every morning.       . Menthol, Topical Analgesic, (BIOFREEZE ROLL-ON) 4 % GEL Apply three times a day  1 Tube  5  . nabumetone (RELAFEN) 750 MG tablet Take 1 tablet (750 mg total) by mouth 2 (two) times daily.  60 tablet  2  . nitroGLYCERIN (NITROSTAT) 0.4 MG SL tablet Place 0.4 mg under the tongue every 5 (five) minutes as needed.        . rosuvastatin (CRESTOR) 20 MG tablet Take 20 mg by mouth every morning.      Marland Kitchen levothyroxine (SYNTHROID, LEVOTHROID) 25 MCG tablet Take 1 tablet (25 mcg total) by mouth daily before  breakfast.  30 tablet  2   No current facility-administered medications on file prior to visit.    Allergies  Allergen Reactions  . Latex   . Tape Other (See Comments)    Causes blisters to form    Past Medical History  Diagnosis Date  . COPD (chronic obstructive pulmonary disease)   . Hypertension   . Hyperlipidemia   . Obesity   . Chest tightness   . Tobacco use disorder     50 pack years; questionably discontinued in 2010  . 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 Heart murmur   . Coronary artery disease   . Carotid artery dissection     left  . Stroke     Past Surgical History  Procedure Laterality Date  . Splenectomy    . Colonoscopy  2011  . Abdominal hysterectomy    . Cholecystectomy    . Breast excisional biopsy      Left  . Larynx surgery      Polyps excised  . Laparotomy      . Carotid stent    . Appendectomy    . Cardiac stent x 2    . Esophagogastroduodenoscopy  08/06/2011    Procedure: ESOPHAGOGASTRODUODENOSCOPY (EGD);  Surgeon: Rogene Houston, MD;  [facility];  Service: Endoscopy;  Laterality: N/A;  . Flexible sigmoidoscopy  08/06/2011    Procedure: FLEXIBLE SIGMOIDOSCOPY;  Surgeon: Rogene Houston, MD;  [facility];  Service: Endoscopy;  Laterality: N/A;    History  Smoking status  . Former Smoker -- 2.00 packs/day for 40 years  . Types: Cigarettes  . Quit date: 07/06/2007  Smokeless tobacco  . Never Used    History  Alcohol Use No    Family History  Problem Relation Age of Onset  . Heart disease    . Arthritis    . Cancer    . Diabetes    . Kidney disease      Reviw of Systems:  Reviewed in the HPI.  All other systems are negative.  Physical Exam: Blood pressure 136/100, pulse 86, height 5\' 6"  (1.676 m), weight 222 lb (100.699 kg). Wt Readings from Last 3 Encounters:  05/15/13 222 lb (100.699 kg)  04/27/13 224 lb 12.8 oz (101.969 kg)  03/28/13 225 lb (102.059 kg)     General: Well developed, well nourished, in no acute distress. Head: Normocephalic, atraumatic, sclera non-icteric, mucus membranes are moist,  Neck: Supple. Carotids are 2 + without bruits. No JVD  Lungs: few wheezes - especially on forced expiration Heart: RR, normal S1S2 Abdomen: Soft, non-tender, non-distended with normal bowel sounds. Msk:  Strength and tone are normal  Extremities: No clubbing or cyanosis. No edema.  Distal pedal pulses are 2+ and equal  Neuro: CN II - XII intact.  Alert and oriented X 3.  Psych:  Normal  ECG: May 15, 2013:  NSR at 84, RAE, otherwise normal normal  Assessment / Plan:

## 2013-05-15 NOTE — Assessment & Plan Note (Signed)
[name] is seen today for a preoperative visit. She has a history of a stroke and peripheral vascular disease. She has a history of COPD and also has chest pain relieved with nitroglycerin. She's not able to exercise and she is not able to walk on a treadmill.  She needs to have breast surgery.  I think our best option is to proceed with a Lexiscan myoview study.  If the Myoview study is  normal or low risk then I think it we can proceed with surgery - realizing that she may have some mild CAD.   If the Myoview study is high risk or shows markedly depressed left ventricular systolic function, then we'll need to do further cardiac evaluation prior to giving the okay to go to breast surgery.  I will see her as needed.    She sees her medical doctor on a regular basis.

## 2013-05-15 NOTE — Patient Instructions (Signed)
Your physician has requested that you have a lexiscan myoview.  Please follow instruction sheet, as given.  Your physician recommends that you continue on your current medications as directed. Please refer to the Current Medication list given to you today.   Your physician recommends that you schedule a follow-up appointment in: as needed basis

## 2013-05-29 ENCOUNTER — Ambulatory Visit (HOSPITAL_COMMUNITY): Payer: PRIVATE HEALTH INSURANCE | Attending: Cardiovascular Disease | Admitting: Radiology

## 2013-05-29 VITALS — BP 110/76 | Ht 66.0 in | Wt 220.0 lb

## 2013-05-29 DIAGNOSIS — R0602 Shortness of breath: Secondary | ICD-10-CM

## 2013-05-29 DIAGNOSIS — E785 Hyperlipidemia, unspecified: Secondary | ICD-10-CM | POA: Insufficient documentation

## 2013-05-29 DIAGNOSIS — R0989 Other specified symptoms and signs involving the circulatory and respiratory systems: Secondary | ICD-10-CM

## 2013-05-29 DIAGNOSIS — R079 Chest pain, unspecified: Secondary | ICD-10-CM | POA: Insufficient documentation

## 2013-05-29 DIAGNOSIS — R0789 Other chest pain: Secondary | ICD-10-CM

## 2013-05-29 DIAGNOSIS — R0609 Other forms of dyspnea: Secondary | ICD-10-CM

## 2013-05-29 DIAGNOSIS — I10 Essential (primary) hypertension: Secondary | ICD-10-CM | POA: Insufficient documentation

## 2013-05-29 MED ORDER — TECHNETIUM TC 99M SESTAMIBI GENERIC - CARDIOLITE
10.8000 | Freq: Once | INTRAVENOUS | Status: AC | PRN
  Administered 2013-05-29: 11 via INTRAVENOUS

## 2013-05-29 MED ORDER — REGADENOSON 0.4 MG/5ML IV SOLN
0.4000 mg | Freq: Once | INTRAVENOUS | Status: AC
  Administered 2013-05-29: 0.4 mg via INTRAVENOUS

## 2013-05-29 MED ORDER — TECHNETIUM TC 99M SESTAMIBI GENERIC - CARDIOLITE
33.0000 | Freq: Once | INTRAVENOUS | Status: AC | PRN
  Administered 2013-05-29: 33 via INTRAVENOUS

## 2013-05-29 NOTE — Progress Notes (Signed)
  [facility] [address] [place],  [address] [phone]    Cardiology Nuclear Med Study  [name] is a 63 y.o. female     MRN : [id number]     DOB: May 24, 1950  Procedure Date: 05/29/2013  Nuclear Med Background Indication for Stress Test:  Evaluation for Ischemia and Surgical Clearance:Preop for Breast Cancer with Dr. Marlou Starks History:  COPD and '12 MPI: NL northline No Report, ECHO: EF: 75-80% Cardiac Risk Factors: Carotid Disease, CVA, Family History - CAD, Hypertension, Lipids, PVD and Smoker  Symptoms:  DOE, Palpitations and SOB   Nuclear Pre-Procedure Caffeine/Decaff Intake:  7:00pm NPO After: 10:00pm   Lungs:  clear O2 Sat: 94% on room air. IV 0.9% NS with Angio Cath:  22g  IV Site: R Forearm  IV Started by:  Matilde Haymaker, RN  Chest Size (in):  40 Cup Size: C  Height: 5\' 6"  (1.676 m)  Weight:  220 lb (99.791 kg)  BMI:  Body mass index is 35.53 kg/(m^2). Tech Comments:  n/a    Nuclear Med Study 1 or 2 day study: 1 day  Stress Test Type:  Lexiscan  Reading MD: n/a  Order Authorizing Provider:  Natale Lay  Resting Radionuclide: Technetium 50m Sestamibi  Resting Radionuclide Dose: 11.0 mCi   Stress Radionuclide:  Technetium 83m Sestamibi  Stress Radionuclide Dose: 33.0 mCi           Stress Protocol Rest HR: 85 Stress HR: 100  Rest BP: 110/76 Stress BP: 119/77  Exercise Time (min): n/a METS: n/a   Predicted Max HR: 157 bpm % Max HR: 63.69 bpm Rate Pressure Product: 11900   Dose of Adenosine (mg):  n/a Dose of Lexiscan: 0.4 mg  Dose of Atropine (mg): n/a Dose of Dobutamine: n/a mcg/kg/min (at max HR)  Stress Test Technologist: Perrin Maltese, EMT-P  Nuclear Technologist:  Charlton Amor, CNMT     Rest Procedure:  Myocardial perfusion imaging was performed at rest 45 minutes following the intravenous administration of Technetium 27m Sestamibi. Rest ECG: NSR - Normal EKG  Stress Procedure:  The patient  received IV Lexiscan 0.4 mg over 15-seconds.  Technetium 53m Sestamibi injected at 30-seconds. This patient was sob and felt weird with the Lexiscan injection. Quantitative spect images were obtained after a 45 minute delay. Stress ECG: No significant change from baseline ECG  QPS Raw Data Images:  Normal; no motion artifact; normal heart/lung ratio. Stress Images:  Normal homogeneous uptake in all areas of the myocardium. Rest Images:  Normal homogeneous uptake in all areas of the myocardium. Subtraction (SDS):  No evidence of ischemia. Transient Ischemic Dilatation (Normal <1.22):  1.43 Lung/Heart Ratio (Normal <0.45):  0.29  Quantitative Gated Spect Images QGS EDV:  67 ml QGS ESV:  14 ml  Impression Exercise Capacity:  Lexiscan with no exercise. BP Response:  Normal blood pressure response. Clinical Symptoms:  No significant symptoms noted. ECG Impression:  No significant ST segment change suggestive of ischemia. Comparison with Prior Nuclear Study: No images to compare  Overall Impression:  Normal stress nuclear study.  No evidence of ischemia.   LV Ejection Fraction: 79%.  LV Wall Motion:  NL LV Function; NL Wall Motion.   Thayer Headings, Brooke Bonito., MD, [facility] 05/29/2013, 5:19 PM Office - [phone] Pager [phone]

## 2013-05-30 ENCOUNTER — Telehealth: Payer: Self-pay | Admitting: Cardiovascular Disease

## 2013-05-30 NOTE — Telephone Encounter (Signed)
Pt is aware of stres test result. Pt  States she is having  breast surgery which  scheduled is pending the stres test result. Pt is aware that MD will review the results to clear her for surgry.

## 2013-05-30 NOTE — Telephone Encounter (Signed)
New message ° ° ° ° °Want stress test results °

## 2013-05-30 NOTE — Telephone Encounter (Signed)
She is at low risk for her upcoming breast surgery.

## 2013-05-31 NOTE — Telephone Encounter (Signed)
I will forward to Dr Leslie Andrea surgeon.

## 2013-06-05 ENCOUNTER — Telehealth (INDEPENDENT_AMBULATORY_CARE_PROVIDER_SITE_OTHER): Payer: Self-pay | Admitting: *Deleted

## 2013-06-05 ENCOUNTER — Other Ambulatory Visit (INDEPENDENT_AMBULATORY_CARE_PROVIDER_SITE_OTHER): Payer: Self-pay | Admitting: General Surgery

## 2013-06-05 DIAGNOSIS — N6320 Unspecified lump in the left breast, unspecified quadrant: Secondary | ICD-10-CM

## 2013-06-05 NOTE — Telephone Encounter (Signed)
Pt called wanting to know her results from her Stress test Dr. Marlou Starks sent her for and wanting to know if she is eligible to move on with the surgery. Please advise.

## 2013-06-06 NOTE — Telephone Encounter (Signed)
Tried calling pt back, no answer or VM. Cardiology to give pt results. Orders given to surgery schedulers. Stop aspirin 5 days prior. Will try pt back later.

## 2013-06-09 NOTE — Telephone Encounter (Signed)
Spoke to pt and she went ahead and stopped her asa now per discussion with Dr Marlou Starks.

## 2013-06-09 NOTE — Telephone Encounter (Signed)
Pt asking if her paperwork with surgery date, time and location has been mailed out per her discussion with Joellen Jersey. Advised I would check with Joellen Jersey on this. If it has not been mailed out, I will ask for Joellen Jersey to send it out for her.

## 2013-06-20 ENCOUNTER — Encounter: Payer: Self-pay | Admitting: Obstetrics and Gynecology

## 2013-06-20 ENCOUNTER — Ambulatory Visit (INDEPENDENT_AMBULATORY_CARE_PROVIDER_SITE_OTHER): Payer: PRIVATE HEALTH INSURANCE | Admitting: Obstetrics and Gynecology

## 2013-06-20 VITALS — BP 160/90 | Ht 66.0 in | Wt 222.0 lb

## 2013-06-20 DIAGNOSIS — R232 Flushing: Secondary | ICD-10-CM

## 2013-06-20 DIAGNOSIS — N951 Menopausal and female climacteric states: Secondary | ICD-10-CM

## 2013-06-20 NOTE — Progress Notes (Signed)
Patient ID: [name], female   DOB: 09-13-1950, 63 y.o.   MRN: [id number]  Assessment:  Menopause SEVERE VASOMOTOR SX   Plan:  1. DISCUSS w/ Dr Karie Kirks 2. CONSIDER BP MED CLONIDINE TO SEE IF THIS WILL HELP  Subjective:  [name] is a 63 y.o. female, No obstetric history on file., who presents for VASOMOTOR SX. PT FINDS THESE DEBILITATING. [place]  S/P CVA 2005  FOR ANEURYSM,, HAS STENT INTRACRAINIAL, IN   LEFT INTRACRANIAL ARTERY. Not an Estrogen candidate HAS TRIED BLACK COHOSH  hAS COPD, STILL SMOKES.1 PPD. hAS CHTN, SEES DR KNOWLTON. .  The following portions of the patient's history were reviewed and updated as appropriate: allergies, current medications, past medical & surgical history, & past family history.   There is no significant family history of breast or ovarian cancer.    Review of Systems Pertinent items are noted in HPI. Breast:Negative for breast lump,nipple discharge or nipple retraction Gastrointestinal: Negative for abdominal pain, change in bowel habits or rectal bleeding GU: Negative for dysuria, frequency, urgency or incontinence.   GYN: No LMP recorded. Patient has had a hysterectomy. HAD HER OVARIES REMOVED.   Objective:  BP 160/90  Ht 5\' 6"  (1.676 m)  Wt 222 lb (100.699 kg)  BMI 35.85 kg/m2    BMI: Body mass index is 35.85 kg/(m^2).  General Appearance: Alert, appropriate appearance for age. No acute distress HEENT: Grossly normal Neck / Thyroid: Supple, no masses, nodes or enlargement   Jonnie Kind MD

## 2013-06-26 ENCOUNTER — Telehealth: Payer: Self-pay | Admitting: Obstetrics and Gynecology

## 2013-06-26 NOTE — Telephone Encounter (Signed)
Pt states last appt B/P elevated Dr. Glo Herring was to discuss with Dr. Karie Kirks, pt PCP and Dr. Glo Herring to follow up with pt.

## 2013-06-27 ENCOUNTER — Encounter: Payer: Self-pay | Admitting: Obstetrics and Gynecology

## 2013-06-27 ENCOUNTER — Telehealth: Payer: Self-pay | Admitting: Obstetrics and Gynecology

## 2013-06-28 ENCOUNTER — Telehealth: Payer: Self-pay | Admitting: Obstetrics and Gynecology

## 2013-06-29 NOTE — Telephone Encounter (Signed)
Patient called on both contact numbers, left message that informed pt that  I have contacted Dr Vickey Sages office re: clonidine as a possible BP med that would reduce vasomotor sx. Pt reminded that smoking remains a concern.

## 2013-06-30 ENCOUNTER — Telehealth: Payer: Self-pay | Admitting: *Deleted

## 2013-06-30 NOTE — Telephone Encounter (Signed)
Pt called states waiting on return call from Dr. Glo Herring. Per Dr. Glo Herring spoke with pt earlier this am.

## 2013-07-03 ENCOUNTER — Encounter (HOSPITAL_BASED_OUTPATIENT_CLINIC_OR_DEPARTMENT_OTHER): Payer: Self-pay | Admitting: *Deleted

## 2013-07-03 NOTE — Progress Notes (Signed)
To go to [facility] for bmet

## 2013-07-03 NOTE — Progress Notes (Signed)
07/03/13 1543  OBSTRUCTIVE SLEEP APNEA  Have you ever been diagnosed with sleep apnea through a sleep study? No  Do you snore loudly (loud enough to be heard through closed doors)?  1  Do you often feel tired, fatigued, or sleepy during the daytime? 0  Has anyone observed you stop breathing during your sleep? 0  Do you have, or are you being treated for high blood pressure? 1  BMI more than 35 kg/m2? 1  Age over 64 years old? 1  Neck circumference greater than 40 cm/16 inches? 1  Gender: 0  Obstructive Sleep Apnea Score 5  Score 4 or greater  Results sent to PCP

## 2013-07-04 ENCOUNTER — Encounter (HOSPITAL_COMMUNITY)
Admission: RE | Admit: 2013-07-04 | Discharge: 2013-07-04 | Disposition: A | Discharge status: Home / Self Care | Payer: PRIVATE HEALTH INSURANCE | Source: Ambulatory Visit | Attending: General Surgery | Admitting: General Surgery

## 2013-07-04 LAB — BASIC METABOLIC PANEL
BUN: 14 mg/dL (ref 6–23)
CHLORIDE: 99 meq/L (ref 96–112)
CO2: 35 meq/L — AB (ref 19–32)
Calcium: 10 mg/dL (ref 8.4–10.5)
Creatinine, Ser: 0.78 mg/dL (ref 0.50–1.10)
GFR calc Af Amer: >90 mL/min (ref >90)
GFR calc non Af Amer: 87 mL/min — ABNORMAL LOW (ref >90)
GLUCOSE: 107 mg/dL — AB (ref 70–99)
POTASSIUM: 4.2 meq/L (ref 3.7–5.3)
Sodium: 143 mEq/L (ref 137–147)

## 2013-07-07 ENCOUNTER — Ambulatory Visit
Admission: RE | Admit: 2013-07-07 | Discharge: 2013-07-07 | Disposition: A | Discharge status: Home / Self Care | Payer: PRIVATE HEALTH INSURANCE | Source: Ambulatory Visit | Attending: General Surgery | Admitting: General Surgery

## 2013-07-07 ENCOUNTER — Encounter (HOSPITAL_BASED_OUTPATIENT_CLINIC_OR_DEPARTMENT_OTHER)
Admission: RE | Disposition: A | Discharge status: Home / Self Care | Payer: Self-pay | Source: Ambulatory Visit | Attending: General Surgery

## 2013-07-07 ENCOUNTER — Ambulatory Visit (HOSPITAL_BASED_OUTPATIENT_CLINIC_OR_DEPARTMENT_OTHER)
Admission: RE | Admit: 2013-07-07 | Discharge: 2013-07-07 | Disposition: A | Discharge status: Home / Self Care | Payer: PRIVATE HEALTH INSURANCE | Source: Ambulatory Visit | Attending: General Surgery | Admitting: General Surgery

## 2013-07-07 ENCOUNTER — Encounter (HOSPITAL_BASED_OUTPATIENT_CLINIC_OR_DEPARTMENT_OTHER): Payer: Self-pay | Admitting: Certified Registered"

## 2013-07-07 ENCOUNTER — Encounter (HOSPITAL_BASED_OUTPATIENT_CLINIC_OR_DEPARTMENT_OTHER): Payer: PRIVATE HEALTH INSURANCE | Admitting: Certified Registered"

## 2013-07-07 ENCOUNTER — Other Ambulatory Visit (INDEPENDENT_AMBULATORY_CARE_PROVIDER_SITE_OTHER): Payer: Self-pay | Admitting: General Surgery

## 2013-07-07 ENCOUNTER — Ambulatory Visit (HOSPITAL_BASED_OUTPATIENT_CLINIC_OR_DEPARTMENT_OTHER): Payer: PRIVATE HEALTH INSURANCE | Admitting: Certified Registered"

## 2013-07-07 DIAGNOSIS — Z01812 Encounter for preprocedural laboratory examination: Secondary | ICD-10-CM | POA: Insufficient documentation

## 2013-07-07 DIAGNOSIS — E785 Hyperlipidemia, unspecified: Secondary | ICD-10-CM | POA: Insufficient documentation

## 2013-07-07 DIAGNOSIS — I10 Essential (primary) hypertension: Secondary | ICD-10-CM | POA: Insufficient documentation

## 2013-07-07 DIAGNOSIS — Z6836 Body mass index (BMI) 36.0-36.9, adult: Secondary | ICD-10-CM | POA: Insufficient documentation

## 2013-07-07 DIAGNOSIS — Z7982 Long term (current) use of aspirin: Secondary | ICD-10-CM | POA: Insufficient documentation

## 2013-07-07 DIAGNOSIS — I2510 Atherosclerotic heart disease of native coronary artery without angina pectoris: Secondary | ICD-10-CM | POA: Insufficient documentation

## 2013-07-07 DIAGNOSIS — N6320 Unspecified lump in the left breast, unspecified quadrant: Secondary | ICD-10-CM

## 2013-07-07 DIAGNOSIS — R011 Cardiac murmur, unspecified: Secondary | ICD-10-CM | POA: Insufficient documentation

## 2013-07-07 DIAGNOSIS — Z87891 Personal history of nicotine dependence: Secondary | ICD-10-CM | POA: Insufficient documentation

## 2013-07-07 DIAGNOSIS — Z8673 Personal history of transient ischemic attack (TIA), and cerebral infarction without residual deficits: Secondary | ICD-10-CM | POA: Insufficient documentation

## 2013-07-07 DIAGNOSIS — J449 Chronic obstructive pulmonary disease, unspecified: Secondary | ICD-10-CM | POA: Insufficient documentation

## 2013-07-07 DIAGNOSIS — R920 Mammographic microcalcification found on diagnostic imaging of breast: Secondary | ICD-10-CM

## 2013-07-07 DIAGNOSIS — N63 Unspecified lump in unspecified breast: Secondary | ICD-10-CM

## 2013-07-07 DIAGNOSIS — N6029 Fibroadenosis of unspecified breast: Secondary | ICD-10-CM | POA: Insufficient documentation

## 2013-07-07 DIAGNOSIS — J4489 Other specified chronic obstructive pulmonary disease: Secondary | ICD-10-CM | POA: Insufficient documentation

## 2013-07-07 DIAGNOSIS — E669 Obesity, unspecified: Secondary | ICD-10-CM | POA: Insufficient documentation

## 2013-07-07 HISTORY — PX: BREAST LUMPECTOMY WITH NEEDLE LOCALIZATION: SHX5759

## 2013-07-07 HISTORY — DX: Presence of spectacles and contact lenses: Z97.3

## 2013-07-07 LAB — POCT HEMOGLOBIN-HEMACUE: Hemoglobin: 16.3 g/dL — ABNORMAL HIGH (ref 12.0–15.0)

## 2013-07-07 SURGERY — BREAST LUMPECTOMY WITH NEEDLE LOCALIZATION
Anesthesia: General | Site: Breast | Laterality: Left

## 2013-07-07 MED ORDER — OXYCODONE HCL 5 MG/5ML PO SOLN: 5.0000 mg | Freq: Once | ORAL | Status: DC | PRN

## 2013-07-07 MED ORDER — OXYCODONE HCL 5 MG PO TABS: 5.0000 mg | ORAL_TABLET | Freq: Once | ORAL | Status: DC | PRN

## 2013-07-07 MED ORDER — ONDANSETRON HCL 4 MG/2ML IJ SOLN: 4.0000 mg | Freq: Once | INTRAMUSCULAR | Status: DC | PRN

## 2013-07-07 MED ORDER — DEXAMETHASONE SODIUM PHOSPHATE 4 MG/ML IJ SOLN
INTRAMUSCULAR | Status: DC | PRN
  Administered 2013-07-07: 10 mg via INTRAVENOUS

## 2013-07-07 MED ORDER — BUPIVACAINE-EPINEPHRINE 0.25% -1:200000 IJ SOLN
INTRAMUSCULAR | Status: DC | PRN
  Administered 2013-07-07: 20 mL

## 2013-07-07 MED ORDER — ONDANSETRON HCL 4 MG/2ML IJ SOLN
INTRAMUSCULAR | Status: DC | PRN
  Administered 2013-07-07: 4 mg via INTRAVENOUS

## 2013-07-07 MED ORDER — PROPOFOL 10 MG/ML IV BOLUS
INTRAVENOUS | Status: DC | PRN
  Administered 2013-07-07: 200 mg via INTRAVENOUS

## 2013-07-07 MED ORDER — CEFAZOLIN SODIUM-DEXTROSE 2-3 GM-% IV SOLR
INTRAVENOUS | Status: AC
  Filled 2013-07-07: qty 50

## 2013-07-07 MED ORDER — CHLORHEXIDINE GLUCONATE 4 % EX LIQD: 1.0000 "application " | Freq: Once | CUTANEOUS | Status: DC

## 2013-07-07 MED ORDER — LIDOCAINE HCL (CARDIAC) 20 MG/ML IV SOLN
INTRAVENOUS | Status: DC | PRN
  Administered 2013-07-07: 60 mg via INTRAVENOUS

## 2013-07-07 MED ORDER — BUPIVACAINE-EPINEPHRINE PF 0.25-1:200000 % IJ SOLN
INTRAMUSCULAR | Status: AC
  Filled 2013-07-07: qty 30

## 2013-07-07 MED ORDER — MIDAZOLAM HCL 5 MG/5ML IJ SOLN
INTRAMUSCULAR | Status: DC | PRN
  Administered 2013-07-07: 2 mg via INTRAVENOUS

## 2013-07-07 MED ORDER — BUPIVACAINE HCL (PF) 0.25 % IJ SOLN
INTRAMUSCULAR | Status: AC
  Filled 2013-07-07: qty 30

## 2013-07-07 MED ORDER — FENTANYL CITRATE 0.05 MG/ML IJ SOLN
INTRAMUSCULAR | Status: AC
  Filled 2013-07-07: qty 6

## 2013-07-07 MED ORDER — EPHEDRINE SULFATE 50 MG/ML IJ SOLN
INTRAMUSCULAR | Status: DC | PRN
  Administered 2013-07-07: 10 mg via INTRAVENOUS

## 2013-07-07 MED ORDER — MIDAZOLAM HCL 2 MG/2ML IJ SOLN
INTRAMUSCULAR | Status: AC
  Filled 2013-07-07: qty 2

## 2013-07-07 MED ORDER — CEFAZOLIN SODIUM-DEXTROSE 2-3 GM-% IV SOLR
2.0000 g | INTRAVENOUS | Status: AC
  Administered 2013-07-07: 2 g via INTRAVENOUS

## 2013-07-07 MED ORDER — HYDROMORPHONE HCL PF 1 MG/ML IJ SOLN
INTRAMUSCULAR | Status: AC
  Filled 2013-07-07: qty 1

## 2013-07-07 MED ORDER — FENTANYL CITRATE 0.05 MG/ML IJ SOLN: 50.0000 ug | INTRAMUSCULAR | Status: DC | PRN

## 2013-07-07 MED ORDER — OXYCODONE-ACETAMINOPHEN 5-325 MG PO TABS: 1.0000 | ORAL_TABLET | ORAL | Status: DC | PRN

## 2013-07-07 MED ORDER — HYDROMORPHONE HCL PF 1 MG/ML IJ SOLN
0.2500 mg | INTRAMUSCULAR | Status: DC | PRN
  Administered 2013-07-07: 0.25 mg via INTRAVENOUS
  Administered 2013-07-07: 0.5 mg via INTRAVENOUS

## 2013-07-07 MED ORDER — FENTANYL CITRATE 0.05 MG/ML IJ SOLN
INTRAMUSCULAR | Status: DC | PRN
  Administered 2013-07-07: 50 ug via INTRAVENOUS

## 2013-07-07 MED ORDER — LACTATED RINGERS IV SOLN
INTRAVENOUS | Status: DC
  Administered 2013-07-07: 11:00:00 via INTRAVENOUS

## 2013-07-07 MED ORDER — MIDAZOLAM HCL 2 MG/2ML IJ SOLN: 1.0000 mg | INTRAMUSCULAR | Status: DC | PRN

## 2013-07-07 SURGICAL SUPPLY — 41 items
ADH SKN CLS APL DERMABOND .7 (GAUZE/BANDAGES/DRESSINGS) ×1
BLADE 15 SAFETY STRL DISP (BLADE) ×3 IMPLANT
CANISTER SUCT 1200ML W/VALVE (MISCELLANEOUS) ×3 IMPLANT
CHLORAPREP W/TINT 26ML (MISCELLANEOUS) ×3 IMPLANT
CLIP TI WIDE RED SMALL 6 (CLIP) IMPLANT
COVER MAYO STAND STRL (DRAPES) ×3 IMPLANT
COVER TABLE BACK 60X90 (DRAPES) ×3 IMPLANT
DECANTER SPIKE VIAL GLASS SM (MISCELLANEOUS) ×1 IMPLANT
DERMABOND ADVANCED (GAUZE/BANDAGES/DRESSINGS) ×2
DERMABOND ADVANCED .7 DNX12 (GAUZE/BANDAGES/DRESSINGS) ×1 IMPLANT
DEVICE DUBIN W/COMP PLATE 8390 (MISCELLANEOUS) ×2 IMPLANT
DRAPE LAPAROSCOPIC ABDOMINAL (DRAPES) ×3 IMPLANT
DRAPE UTILITY XL STRL (DRAPES) ×3 IMPLANT
ELECT COATED BLADE 2.86 ST (ELECTRODE) ×3 IMPLANT
ELECT REM PT RETURN 9FT ADLT (ELECTROSURGICAL) ×3
ELECTRODE REM PT RTRN 9FT ADLT (ELECTROSURGICAL) ×1 IMPLANT
GLOVE BIO SURGEON STRL SZ7.5 (GLOVE) ×1 IMPLANT
GLOVE SURG SS PI 6.5 STRL IVOR (GLOVE) ×2 IMPLANT
GLOVE SURG SS PI 7.5 STRL IVOR (GLOVE) ×2 IMPLANT
GOWN STRL REUS W/ TWL LRG LVL3 (GOWN DISPOSABLE) ×2 IMPLANT
GOWN STRL REUS W/TWL LRG LVL3 (GOWN DISPOSABLE) ×6
KIT MARKER MARGIN INK (KITS) IMPLANT
NDL HYPO 25X1 1.5 SAFETY (NEEDLE) ×1 IMPLANT
NEEDLE HYPO 25X1 1.5 SAFETY (NEEDLE) ×3 IMPLANT
NS IRRIG 1000ML POUR BTL (IV SOLUTION) ×3 IMPLANT
PACK BASIN DAY SURGERY FS (CUSTOM PROCEDURE TRAY) ×3 IMPLANT
PENCIL BUTTON HOLSTER BLD 10FT (ELECTRODE) ×3 IMPLANT
SLEEVE SCD COMPRESS KNEE MED (MISCELLANEOUS) ×3 IMPLANT
SPONGE LAP 18X18 X RAY DECT (DISPOSABLE) ×3 IMPLANT
STAPLER VISISTAT 35W (STAPLE) IMPLANT
SUT MON AB 4-0 PC3 18 (SUTURE) ×3 IMPLANT
SUT SILK 2 0 SH (SUTURE) ×3 IMPLANT
SUT VIC AB 3-0 54X BRD REEL (SUTURE) IMPLANT
SUT VIC AB 3-0 BRD 54 (SUTURE)
SUT VICRYL 3-0 CR8 SH (SUTURE) ×3 IMPLANT
SYR CONTROL 10ML LL (SYRINGE) ×3 IMPLANT
TOWEL OR 17X24 6PK STRL BLUE (TOWEL DISPOSABLE) ×3 IMPLANT
TOWEL OR NON WOVEN STRL DISP B (DISPOSABLE) ×1 IMPLANT
TUBE CONNECTING 20'X1/4 (TUBING) ×1
TUBE CONNECTING 20X1/4 (TUBING) ×2 IMPLANT
YANKAUER SUCT BULB TIP NO VENT (SUCTIONS) ×3 IMPLANT

## 2013-07-07 NOTE — Anesthesia Procedure Notes (Signed)
Procedure Name: LMA Insertion Date/Time: 07/07/2013 11:11 AM Performed by: Mikhai Bienvenue Pre-anesthesia Checklist: Patient identified, Emergency Drugs available, Suction available and Patient being monitored Patient Re-evaluated:Patient Re-evaluated prior to inductionOxygen Delivery Method: Circle System Utilized Preoxygenation: Pre-oxygenation with 100% oxygen Intubation Type: IV induction Ventilation: Mask ventilation without difficulty LMA: LMA inserted LMA Size: 4.0 Number of attempts: 1 Airway Equipment and Method: bite block Placement Confirmation: positive ETCO2 Tube secured with: Tape Dental Injury: Teeth and Oropharynx as per pre-operative assessment

## 2013-07-07 NOTE — Op Note (Signed)
07/07/2013  11:51 AM  PATIENT:  [name]  63 y.o. female  PRE-OPERATIVE DIAGNOSIS:  left breast complex sclerosing lesion   POST-OPERATIVE DIAGNOSIS:  left breast complex sclerosing lesion  PROCEDURE:  Procedure(s): BREAST LUMPECTOMY WITH NEEDLE LOCALIZATION (Left)  SURGEON:  Surgeon(s) and Role:    * Merrie Roof, MD - Primary  PHYSICIAN ASSISTANT:   ASSISTANTS: none   ANESTHESIA:   general  EBL:  Total I/O In: 500 [I.V.:500] Out: -   BLOOD ADMINISTERED:none  DRAINS: none   LOCAL MEDICATIONS USED:  MARCAINE     SPECIMEN:  Source of Specimen:  left breast tissue  DISPOSITION OF SPECIMEN:  PATHOLOGY  COUNTS:  YES  TOURNIQUET:  * No tourniquets in log *  DICTATION: .Dragon Dictation After informed consent was obtained the patient was brought to the operating room and placed in the supine position on the operating room table. After adequate induction of general anesthesia, the patient's left breast was prepped with ChloraPrep, allowed to dry, and draped in usual sterile manner. Earlier in the day the patient underwent wire localization procedure and the wire was entering the left breast laterally and headed deep and medially. A transversely oriented incision was made with a 15 blade knife overlying the wire. This incision was carried through the skin and subcutaneous tissue sharply with electrocautery. Once the breast tissue was entered the path of the wire could be palpated. A circular portion of breast tissue was excised sharply around the path of the wire with the electrocautery. Once the specimen was removed it was oriented with a short stitch on the superior surface and a long stitch on the lateral surface. The wire was entering the specimen anteriorly. A specimen radiograph was obtained that showed the clip and wire to be in the center of the specimen. The specimen was then sent to pathology for further evaluation. Hemostasis was achieved using the Bovie  electrocautery. The wound was then infiltrated with quarter percent Marcaine. The deep layer the wound was closed with interrupted 3-0 Vicryl stitches. The skin was then closed with interrupted 4-0 Monocryl subcuticular stitches. Dermabond dressings were applied. The patient tolerated the procedure well. At the end of the case needle sponge and instrument counts were correct. The patient was then awakened and taken to recovery in stable condition.  PLAN OF CARE: Discharge to home after PACU  PATIENT DISPOSITION:  PACU - hemodynamically stable.   Delay start of Pharmacological VTE agent (>24hrs) due to surgical blood loss or risk of bleeding: not applicable

## 2013-07-07 NOTE — Anesthesia Preprocedure Evaluation (Signed)
Anesthesia Evaluation  Patient identified by MRN, date of birth, ID band Patient awake    Reviewed: Allergy & Precautions, H&P , NPO status , Patient's Chart, lab work & pertinent test results  Airway Mallampati: I TM Distance: >3 FB Neck ROM: Full    Dental  (+) Upper Dentures, Lower Dentures, Dental Advisory Given   Pulmonary COPD COPD inhaler, Current Smoker,  breath sounds clear to auscultation        Cardiovascular hypertension, Pt. on medications + CAD Rhythm:Regular Rate:Normal     Neuro/Psych CVA, No Residual Symptoms    GI/Hepatic   Endo/Other    Renal/GU      Musculoskeletal   Abdominal   Peds  Hematology   Anesthesia Other Findings   Reproductive/Obstetrics                           Anesthesia Physical Anesthesia Plan  ASA: III  Anesthesia Plan: General   Post-op Pain Management:    Induction: Intravenous  Airway Management Planned: LMA  Additional Equipment:   Intra-op Plan:   Post-operative Plan: Extubation in OR  Informed Consent: I have reviewed the patients History and Physical, chart, labs and discussed the procedure including the risks, benefits and alternatives for the proposed anesthesia with the patient or authorized representative who has indicated his/her understanding and acceptance.   Dental advisory given  Plan Discussed with: CRNA, Anesthesiologist and Surgeon  Anesthesia Plan Comments:         Anesthesia Quick Evaluation

## 2013-07-07 NOTE — Transfer of Care (Signed)
Immediate Anesthesia Transfer of Care Note  Patient: [name]  Procedure(s) Performed: Procedure(s): BREAST LUMPECTOMY WITH NEEDLE LOCALIZATION (Left)  Patient Location: PACU  Anesthesia Type:General  Level of Consciousness: awake, alert , oriented and patient cooperative  Airway & Oxygen Therapy: Patient Spontanous Breathing and Patient connected to face mask oxygen  Post-op Assessment: Report given to PACU RN and Post -op Vital signs reviewed and stable  Post vital signs: Reviewed and stable  Complications: No apparent anesthesia complications

## 2013-07-07 NOTE — Interval H&P Note (Signed)
History and Physical Interval Note:  07/07/2013 10:31 AM  [name]  has presented today for surgery, with the diagnosis of left breast complex sclerosing lesion   The various methods of treatment have been discussed with the patient and family. After consideration of risks, benefits and other options for treatment, the patient has consented to  Procedure(s): BREAST LUMPECTOMY WITH NEEDLE LOCALIZATION (Left) as a surgical intervention .  The patient's history has been reviewed, patient examined, no change in status, stable for surgery.  I have reviewed the patient's chart and labs.  Questions were answered to the patient's satisfaction.     [name]

## 2013-07-07 NOTE — Discharge Instructions (Signed)

## 2013-07-07 NOTE — H&P (Signed)
[name]  04/27/2013 3:00 PM   Office Visit  MRN:  [id number]   Description: 63 year old female  Provider: Merrie Roof, MD  Department: [facility]         Guarantor Account: Arena, Lindahl ([id number])      Relation to Patient: Account Type Service Area      Self Personal/Family [place] for This Account     Coverage ID [place] ID      [phone] [place] MEDICARE [facility] [id number]              Guarantor Account: Kollins, Fenter ([id number])      Relation to Patient: Account Type Service Area      Self Personal/Family [place] for This Account     Coverage ID Payor Plan Insurance ID      [id number] [place] MEDICARE [facility] [id number]              Guarantor Account: Morrigan, Wickens ([id number])      Relation to Patient: Account Type Service Area      Self Personal/Family [facility]                 Guarantor Account: Eman, Rynders ([id number])      Relation to Patient: Account Type Service Area      Self Personal/Family [place] for This Account     Coverage ID Payor Plan Insurance ID      [id number] [place] MEDICARE [facility] [id number]                Diagnoses      Left breast mass    -  Primary      611.72      Breast mass          611.72                Current Vitals Most recent update: 04/27/2013  2:58 PM by Carlene Coria, CMA      BP Pulse Resp Ht Wt BMI      154/96 86 22 5\' 6"  (1.676 m) 224 lb 12.8 oz (101.969 kg) 36.30 kg/m2              Progress Notes      Merrie Roof, MD at 04/27/2013  4:09 PM      Status: Signed            Patient ID: [name], female   DOB: [birth year], 63 y.o.   MRN: [id number]   No chief complaint on file.     HPI [name] is a 63 y.o. female.  We are asked to see  the patient in consultation by Dr. Wandalee Ferdinand to evaluate her for a left breast complex sclerosing lesion. The patient is a 63 year old white female who has had pain in her left breast for a number of years. She denies any discharge from her nipple. She recently went for a routine screening mammogram at which time an abnormality was seen in the lateral aspect of the left breast. This was biopsied and came back as a complex sclerosing lesion.  HPI    Past Medical History   Diagnosis  Date   .  COPD (chronic obstructive pulmonary disease)     .  Hypertension     .  Hyperlipidemia     .  Obesity     .  Chest tightness     .  Tobacco use disorder         50 pack years; questionably discontinued in 2010   .  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  Heart murmur     .  Coronary artery disease     .  Carotid artery dissection         left   .  Stroke           Past Surgical History   Procedure  Laterality  Date   .  Splenectomy       .  Colonoscopy    2011   .  Abdominal hysterectomy       .  Cholecystectomy       .  Breast excisional biopsy           Left   .  Larynx surgery           Polyps excised   .  Laparotomy       .  Carotid stent       .  Appendectomy       .  Cardiac stent x 2       .  Esophagogastroduodenoscopy    08/06/2011       Procedure: ESOPHAGOGASTRODUODENOSCOPY (EGD);  Surgeon: Rogene Houston, MD;  [facility];  Service: Endoscopy;  Laterality: N/A;   .  Flexible sigmoidoscopy    08/06/2011       Procedure: FLEXIBLE SIGMOIDOSCOPY;  Surgeon: Rogene Houston, MD;  [facility];  Service: Endoscopy;  Laterality: N/A;         Family History   Problem  Relation  Age of Onset   .  Heart disease       .  Arthritis       .   Cancer       .  Diabetes       .  Kidney disease            Social History History   Substance Use Topics   .  Smoking status:  Former Smoker -- 2.00 packs/day for 40 years       Types:  Cigarettes       Quit date:  07/06/2007   .  Smokeless tobacco:  Never Used   .  Alcohol Use:  No         Allergies   Allergen  Reactions   .  Latex     .  Tape  Other (See Comments)       Causes blisters to form         Current Outpatient Prescriptions   Medication  Sig  Dispense  Refill   .  albuterol (PROVENTIL) (2.5 MG/3ML) 0.083% nebulizer solution  Take 2.5 mg by nebulization every 4 (four) hours as needed. For shortness of breath          .  albuterol (PROVENTIL,VENTOLIN) 90 MCG/ACT inhaler  Inhale 2 puffs  into the lungs every 4 (four) hours as needed. For shortness of breath         .  aspirin EC 81 MG tablet  Take 81 mg by mouth every morning.          .  clorazepate (TRANXENE) 7.5 MG tablet  Take 7.5 mg by mouth 3 (three) times daily.           Marland Kitchen  diltiazem (CARDIZEM CD) 300 MG 24 hr capsule  Take 300 mg by mouth every morning.          Marland Kitchen  esomeprazole (NEXIUM) 40 MG capsule  Take 40 mg by mouth daily before breakfast.           .  fenofibrate (TRICOR) 145 MG tablet  Take 145 mg by mouth every morning.          .  furosemide (LASIX) 20 MG tablet  Take 20 mg by mouth daily as needed. Fluid retention         .  hydrochlorothiazide (HYDRODIURIL) 25 MG tablet  Take 25 mg by mouth every morning.          Marland Kitchen  HYDROcodone-acetaminophen (NORCO) 10-325 MG per tablet  Take 1 tablet by mouth 3 (three) times daily as needed for pain.          Marland Kitchen  losartan (COZAAR) 100 MG tablet  Take 100 mg by mouth every morning.          .  Menthol, Topical Analgesic, (BIOFREEZE ROLL-ON) 4 % GEL  Apply three times a day   1 Tube   5   .  nabumetone (RELAFEN) 750 MG tablet  Take 1 tablet (750 mg total) by mouth 2 (two) times daily.   60 tablet   2   .  nitroGLYCERIN (NITROSTAT) 0.4 MG SL tablet  Place 0.4 mg  under the tongue every 5 (five) minutes as needed.           .  rosuvastatin (CRESTOR) 20 MG tablet  Take 20 mg by mouth every morning.         Marland Kitchen  levothyroxine (SYNTHROID, LEVOTHROID) 25 MCG tablet  Take 1 tablet (25 mcg total) by mouth daily before breakfast.   30 tablet   2       No current facility-administered medications for this visit.        Review of Systems Review of Systems  Constitutional: Negative.   HENT: Negative.   Eyes: Negative.   Respiratory: Positive for shortness of breath.   Cardiovascular: Negative.   Gastrointestinal: Negative.   Endocrine: Negative.   Genitourinary: Negative.   Musculoskeletal: Negative.   Skin: Negative.   Allergic/Immunologic: Negative.   Neurological: Negative.   Hematological: Negative.   Psychiatric/Behavioral: Negative.       Blood pressure 154/96, pulse 86, resp. rate 22, height 5\' 6"  (1.676 m), weight 224 lb 12.8 oz (101.969 kg).   Physical Exam Physical Exam  Constitutional: She is oriented to person, place, and time. She appears well-developed and well-nourished.  HENT:   Head: Normocephalic and atraumatic.  Eyes: Conjunctivae and EOM are normal. Pupils are equal, round, and reactive to light.  Neck: Normal range of motion. Neck supple.  Cardiovascular: Normal rate, regular rhythm and normal heart sounds.   Pulmonary/Chest: Effort normal and breath sounds normal.  There is no palpable mass in either breast. There is no palpable axillary, supraclavicular, or cervical lymphadenopathy  Abdominal: Soft. Bowel sounds are normal.  Musculoskeletal: Normal  range of motion.  Lymphadenopathy:    She has no cervical adenopathy.  Neurological: She is alert and oriented to person, place, and time.  Skin: Skin is warm and dry.  Psychiatric: She has a normal mood and affect. Her behavior is normal.      Data Reviewed As above   Assessment    The patient appears to have a complex sclerosing lesion in the lateral left  breast. Although this is benign it is considered a high risk lesion and does increase her risk for breast cancer during her lifetime. Because of this the recommendation is that she have this area removed. This will require a wire localized lumpectomy. I've discussed with her in detail the risks and benefits of the operation to do this as well as some of the technical aspects and she understands and wishes to proceed. Given her cardiac history I think she will require cardiac clearance prior to scheduling surgery.      Plan    Plan for cardiac clearance and then possible wire localized left breast lumpectomy

## 2013-07-07 NOTE — Anesthesia Postprocedure Evaluation (Signed)
  Anesthesia Post-op Note  Patient: [name]  Procedure(s) Performed: Procedure(s): BREAST LUMPECTOMY WITH NEEDLE LOCALIZATION (Left)  Patient Location: PACU  Anesthesia Type:General  Level of Consciousness: awake, alert  and oriented  Airway and Oxygen Therapy: Patient Spontanous Breathing  Post-op Pain: none  Post-op Assessment: Post-op Vital signs reviewed  Post-op Vital Signs: Reviewed  Last Vitals:  Filed Vitals:   07/07/13 1230  BP: 141/77  Pulse: 80  Temp:   Resp: 15    Complications: No apparent anesthesia complications

## 2013-07-10 ENCOUNTER — Encounter (HOSPITAL_BASED_OUTPATIENT_CLINIC_OR_DEPARTMENT_OTHER): Payer: Self-pay | Admitting: General Surgery

## 2013-07-12 ENCOUNTER — Telehealth (INDEPENDENT_AMBULATORY_CARE_PROVIDER_SITE_OTHER): Payer: Self-pay | Admitting: *Deleted

## 2013-07-12 NOTE — Telephone Encounter (Signed)
Pt was transferred to my voicemail, she was inquiring about some test results?  Please advise!  [name]

## 2013-07-13 ENCOUNTER — Telehealth (INDEPENDENT_AMBULATORY_CARE_PROVIDER_SITE_OTHER): Payer: Self-pay

## 2013-07-13 NOTE — Telephone Encounter (Signed)
Pt had a  Left breast lumpectomy on 08/06/13 by Dr Marlou Starks. Pt called the office to see if Dr Marlou Starks would refill her narcotics for the first time. Per protocol script wriiten for Norco 5/325mg  #30 1 PO q4-6 hrs as needed for pain with no refills. Will ask urgent office MD to sign and notify pt to p/u at front desk.

## 2013-07-13 NOTE — Telephone Encounter (Signed)
Pt returned call and given attached path result.

## 2013-07-13 NOTE — Telephone Encounter (Signed)
LMOM> path came back as benign breast tissue

## 2013-07-13 NOTE — Telephone Encounter (Signed)
Called pt to let her know that Dr Brantley Stage wrote a rx for Norco 5/325 and that it will be a the front desk for her to pick-up. Pt states that she will pick this up on 07/14/13.

## 2013-07-21 ENCOUNTER — Encounter (INDEPENDENT_AMBULATORY_CARE_PROVIDER_SITE_OTHER): Payer: PRIVATE HEALTH INSURANCE | Admitting: General Surgery

## 2013-07-26 ENCOUNTER — Ambulatory Visit: Payer: PRIVATE HEALTH INSURANCE | Admitting: Obstetrics and Gynecology

## 2013-07-27 ENCOUNTER — Ambulatory Visit (INDEPENDENT_AMBULATORY_CARE_PROVIDER_SITE_OTHER): Payer: PRIVATE HEALTH INSURANCE | Admitting: General Surgery

## 2013-07-27 ENCOUNTER — Encounter (INDEPENDENT_AMBULATORY_CARE_PROVIDER_SITE_OTHER): Payer: Self-pay | Admitting: General Surgery

## 2013-07-27 VITALS — BP 144/86 | HR 82 | Temp 97.8°F | Resp 16 | Wt 221.0 lb

## 2013-07-27 DIAGNOSIS — N63 Unspecified lump in unspecified breast: Secondary | ICD-10-CM

## 2013-07-27 NOTE — Progress Notes (Signed)
Subjective:     Patient ID: [name], female   DOB: Aug 23, 1950, 63 y.o.   MRN: [id number]  HPI The patient is a 63 year old white female who is 2 weeks status post left breast lumpectomy for a complex sclerosing lesion. Her final pathology was all benign. She only complains of some soreness in the lateral aspect of the left breast but otherwise is very happy with the outcome.  Review of Systems     Objective:   Physical Exam On exam her left breast incision is healing nicely with no sign of infection or significant seroma.    Assessment:     The patient is 2 weeks status post left breast lumpectomy for benign disease     Plan:     At this point she will continue to do regular self exams. I will plan to see her back in one month.

## 2013-09-07 ENCOUNTER — Encounter (INDEPENDENT_AMBULATORY_CARE_PROVIDER_SITE_OTHER): Payer: PRIVATE HEALTH INSURANCE | Admitting: General Surgery

## 2013-10-13 ENCOUNTER — Encounter (INDEPENDENT_AMBULATORY_CARE_PROVIDER_SITE_OTHER): Payer: PRIVATE HEALTH INSURANCE | Admitting: General Surgery

## 2013-10-23 ENCOUNTER — Encounter (INDEPENDENT_AMBULATORY_CARE_PROVIDER_SITE_OTHER): Payer: PRIVATE HEALTH INSURANCE | Admitting: General Surgery

## 2013-11-09 ENCOUNTER — Encounter (INDEPENDENT_AMBULATORY_CARE_PROVIDER_SITE_OTHER): Payer: Self-pay | Admitting: General Surgery

## 2013-11-09 ENCOUNTER — Ambulatory Visit (INDEPENDENT_AMBULATORY_CARE_PROVIDER_SITE_OTHER): Payer: PRIVATE HEALTH INSURANCE | Admitting: General Surgery

## 2013-11-09 ENCOUNTER — Encounter (INDEPENDENT_AMBULATORY_CARE_PROVIDER_SITE_OTHER): Payer: PRIVATE HEALTH INSURANCE | Admitting: General Surgery

## 2013-11-09 VITALS — BP 124/84 | HR 67 | Temp 97.8°F | Ht 65.0 in | Wt 217.0 lb

## 2013-11-09 DIAGNOSIS — N63 Unspecified lump in unspecified breast: Secondary | ICD-10-CM

## 2013-11-09 NOTE — Patient Instructions (Signed)
Will get mammogram and u/s left breast

## 2013-11-09 NOTE — Progress Notes (Signed)
Patient ID: [name], female   DOB: 1950-08-26, 62 y.o.   MRN: [id number]  Chief Complaint  Patient presents with  . Follow-up    HPI [name] is a 63 y.o. female.  The patient is a 63 year old white female who is 4 months status post left breast lumpectomy for a benign complex sclerosing lesion. She was initially doing well but over the last couple weeks has developed a new tender mass in the inner aspect of the left breast. Her previous surgery was on the lateral aspect of the breast. She has not had any discharge from her nipple.  HPI  Past Medical History  Diagnosis Date  . COPD (chronic obstructive pulmonary disease)   . Hypertension   . Hyperlipidemia   . Obesity   . Chest tightness   . Tobacco use disorder     50 pack years; questionably discontinued in 2010  . 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 Heart murmur   . Coronary artery disease   . Carotid artery dissection     left  . Stroke   . Wears glasses     Past Surgical History  Procedure Laterality Date  . Splenectomy    . Colonoscopy  2011  . Abdominal hysterectomy    . Cholecystectomy    . Breast excisional biopsy      Left  . Larynx surgery      Polyps excised  . Laparotomy    . Carotid stent    . Appendectomy    . Cardiac stent x 2      not in heart-onlt head  . Esophagogastroduodenoscopy  08/06/2011    Procedure: ESOPHAGOGASTRODUODENOSCOPY (EGD);  Surgeon: [name] Houston, MD;  [facility];  Service: Endoscopy;  Laterality: N/A;  . Flexible sigmoidoscopy  08/06/2011    Procedure: FLEXIBLE SIGMOIDOSCOPY;  Surgeon: [name] Houston, MD;  [facility];  Service: Endoscopy;  Laterality: N/A;  . Breast lumpectomy with needle localization Left 07/07/2013     Procedure: BREAST LUMPECTOMY WITH NEEDLE LOCALIZATION;  Surgeon: [name] Roof, MD;  Location: [place];  Service: General;  Laterality: Left;    Family History  Problem Relation Age of Onset  . Heart disease    . Arthritis    . Cancer    . Diabetes    . Kidney disease      Social History History  Substance Use Topics  . Smoking status: Current Every Day Smoker -- 1.00 packs/day for 40 years    Types: Cigarettes    Last Attempt to Quit: 07/06/2007  . Smokeless tobacco: Never Used  . Alcohol Use: No    Allergies  Allergen Reactions  . Latex   . Tape Other (See Comments)    Causes blisters to form    Current Outpatient Prescriptions  Medication Sig Dispense Refill  . ACCU-CHEK AVIVA PLUS test strip       . ACCU-CHEK SOFTCLIX LANCETS lancets       . albuterol (PROVENTIL) (2.5 MG/3ML) 0.083% nebulizer solution Take 2.5 mg by nebulization every 4 (four) hours as needed. For shortness of breath       . albuterol (PROVENTIL,VENTOLIN) 90 MCG/ACT inhaler Inhale 2 puffs into the lungs every 4 (four) hours as needed. For shortness of breath      .  aspirin EC 81 MG tablet Take 81 mg by mouth every morning.       . Blood Glucose Monitoring Suppl (ACCU-CHEK AVIVA PLUS) W/DEVICE KIT       . cloNIDine (CATAPRES) 0.1 MG tablet       . clorazepate (TRANXENE) 7.5 MG tablet Take 7.5 mg by mouth 3 (three) times daily.       . diltiazem (CARDIZEM CD) 300 MG 24 hr capsule       . diltiazem (TIAZAC) 300 MG 24 hr capsule       . esomeprazole (NEXIUM) 40 MG capsule Take 40 mg by mouth daily before breakfast.        . fenofibrate (TRICOR) 145 MG tablet Take 145 mg by mouth every morning.       . furosemide (LASIX) 20 MG tablet Take 20 mg by mouth daily as needed. Fluid retention      . hydrochlorothiazide (HYDRODIURIL) 25 MG tablet Take 25 mg by mouth every morning.       . losartan (COZAAR) 100 MG tablet Take 100 mg by mouth every morning.       . nitroGLYCERIN (NITROSTAT) 0.4  MG SL tablet Place 0.4 mg under the tongue every 5 (five) minutes as needed.        . oxyCODONE-acetaminophen (ROXICET) 5-325 MG per tablet Take 1-2 tablets by mouth every 4 (four) hours as needed for severe pain.  50 tablet  0  . potassium chloride SA (K-DUR,KLOR-CON) 20 MEQ tablet       . QVAR 80 MCG/ACT inhaler       . rosuvastatin (CRESTOR) 20 MG tablet Take 20 mg by mouth every morning.      . temazepam (RESTORIL) 30 MG capsule       . levothyroxine (SYNTHROID, LEVOTHROID) 25 MCG tablet Take 1 tablet (25 mcg total) by mouth daily before breakfast.  30 tablet  2   No current facility-administered medications for this visit.    Review of Systems Review of Systems  Constitutional: Negative.   HENT: Negative.   Eyes: Negative.   Respiratory: Negative.   Cardiovascular: Negative.   Gastrointestinal: Negative.   Endocrine: Negative.   Genitourinary: Negative.   Musculoskeletal: Negative.   Skin: Negative.   Allergic/Immunologic: Negative.   Neurological: Negative.   Hematological: Negative.   Psychiatric/Behavioral: Negative.     Blood pressure 124/84, pulse 67, temperature 97.8 F (36.6 C), height 5' 5" (1.651 m), weight 217 lb (98.431 kg).  Physical Exam Physical Exam  Constitutional: She is oriented to person, place, and time. She appears well-developed and well-nourished.  HENT:  Head: Normocephalic and atraumatic.  Eyes: Conjunctivae and EOM are normal. Pupils are equal, round, and reactive to light.  Neck: Normal range of motion. Neck supple.  Cardiovascular: Normal rate, regular rhythm and normal heart sounds.   Pulmonary/Chest: Effort normal and breath sounds normal.  Then the incision on the lateral left breast has healed well with no sign of infection or seroma. There is a tender mass and fullness on the inner left breast. The edges of this are not well-defined. There is no cellulitis. There is no palpable axillary, supraclavicular, or cervical lymphadenopathy   Abdominal: Soft. Bowel sounds are normal.  Musculoskeletal: Normal range of motion.  Lymphadenopathy:    She has no cervical adenopathy.  Neurological: She is alert and oriented to person, place, and time.  Skin: Skin is warm and dry.  Psychiatric: She has a normal mood and affect. Her behavior is normal.      Data Reviewed As above  Assessment    The patient has a new tender mass on the inner aspect of the left breast. The area of her previous surgery looks good.     Plan    At this point we will have her evaluated with mammogram and ultrasound. If this all appears benign then we will plan to see her back in about 3 months.        TOTH III,Makella Buckingham S 11/09/2013, 1:58 PM

## 2013-11-14 ENCOUNTER — Other Ambulatory Visit: Payer: PRIVATE HEALTH INSURANCE

## 2013-11-15 ENCOUNTER — Ambulatory Visit
Admission: RE | Admit: 2013-11-15 | Discharge: 2013-11-15 | Disposition: A | Discharge status: Home / Self Care | Payer: PRIVATE HEALTH INSURANCE | Source: Ambulatory Visit | Attending: General Surgery | Admitting: General Surgery

## 2013-11-15 DIAGNOSIS — N63 Unspecified lump in unspecified breast: Secondary | ICD-10-CM

## 2014-07-20 ENCOUNTER — Emergency Department (HOSPITAL_COMMUNITY)
Admission: EM | Admit: 2014-07-20 | Discharge: 2014-07-20 | Disposition: A | Discharge status: Home / Self Care | Payer: Medicare Other | Attending: Emergency Medicine | Admitting: Emergency Medicine

## 2014-07-20 ENCOUNTER — Emergency Department (HOSPITAL_COMMUNITY): Payer: Medicare Other

## 2014-07-20 ENCOUNTER — Encounter (HOSPITAL_COMMUNITY): Payer: Self-pay | Admitting: *Deleted

## 2014-07-20 DIAGNOSIS — Z87891 Personal history of nicotine dependence: Secondary | ICD-10-CM | POA: Diagnosis not present

## 2014-07-20 DIAGNOSIS — E669 Obesity, unspecified: Secondary | ICD-10-CM | POA: Diagnosis not present

## 2014-07-20 DIAGNOSIS — Z7982 Long term (current) use of aspirin: Secondary | ICD-10-CM | POA: Insufficient documentation

## 2014-07-20 DIAGNOSIS — Z91040 Latex allergy status: Secondary | ICD-10-CM | POA: Insufficient documentation

## 2014-07-20 DIAGNOSIS — M542 Cervicalgia: Secondary | ICD-10-CM | POA: Diagnosis present

## 2014-07-20 DIAGNOSIS — Z973 Presence of spectacles and contact lenses: Secondary | ICD-10-CM | POA: Insufficient documentation

## 2014-07-20 DIAGNOSIS — Z79899 Other long term (current) drug therapy: Secondary | ICD-10-CM | POA: Insufficient documentation

## 2014-07-20 DIAGNOSIS — R011 Cardiac murmur, unspecified: Secondary | ICD-10-CM | POA: Diagnosis not present

## 2014-07-20 DIAGNOSIS — Z8673 Personal history of transient ischemic attack (TIA), and cerebral infarction without residual deficits: Secondary | ICD-10-CM | POA: Diagnosis not present

## 2014-07-20 DIAGNOSIS — J449 Chronic obstructive pulmonary disease, unspecified: Secondary | ICD-10-CM | POA: Diagnosis not present

## 2014-07-20 DIAGNOSIS — E785 Hyperlipidemia, unspecified: Secondary | ICD-10-CM | POA: Insufficient documentation

## 2014-07-20 DIAGNOSIS — I2510 Atherosclerotic heart disease of native coronary artery without angina pectoris: Secondary | ICD-10-CM | POA: Insufficient documentation

## 2014-07-20 MED ORDER — HYDROMORPHONE HCL 4 MG PO TABS: 4.0000 mg | ORAL_TABLET | Freq: Four times a day (QID) | ORAL | Status: DC | PRN

## 2014-07-20 MED ORDER — HYDROMORPHONE HCL 2 MG/ML IJ SOLN
2.0000 mg | Freq: Once | INTRAMUSCULAR | Status: AC
  Administered 2014-07-20: 2 mg via INTRAMUSCULAR
  Filled 2014-07-20: qty 1

## 2014-07-20 NOTE — ED Notes (Signed)
Discharge instructions given, pt demonstrated teach back and verbal understanding. No concerns voiced.  

## 2014-07-20 NOTE — ED Provider Notes (Signed)
CSN: [id number]     Arrival date & time 07/20/14  1950 History   First MD Initiated Contact with Patient 07/20/14 2009     Chief Complaint  Patient presents with  . Neck Pain     (Consider location/radiation/quality/duration/timing/severity/associated sxs/prior Treatment) Patient is a 64 y.o. female presenting with neck pain. The history is provided by the patient.  Neck Pain Associated symptoms: no chest pain, no fever, no headaches, no numbness and no weakness    patient with onset of base of the neck pain sort of midline and paraspinous area. 5 days ago no injury. Made worse with movement. Pain level is 10 out of 10. Patient been taking hydrocodone without success. Heard some pops and cracks in that area. No numbness or weakness to the hands. No radiation of pain into the arms. No other complaints.  Past Medical History  Diagnosis Date  . COPD (chronic obstructive pulmonary disease)   . Hypertension   . Hyperlipidemia   . Obesity   . Chest tightness   . Tobacco use disorder     50 pack years; questionably discontinued in 2010  . 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 Heart murmur   . Coronary artery disease   . Carotid artery dissection     left  . Stroke   . Wears glasses    Past Surgical History  Procedure Laterality Date  . Splenectomy    . Colonoscopy  2011  . Abdominal hysterectomy    . Cholecystectomy    . Breast excisional biopsy      Left  . Larynx surgery      Polyps excised  . Laparotomy    . Carotid stent    . Appendectomy    . Cardiac stent x 2      not in heart-onlt head  . Esophagogastroduodenoscopy  08/06/2011    Procedure: ESOPHAGOGASTRODUODENOSCOPY (EGD);  Surgeon: Rogene Houston, MD;  Location: [facility];  Service: Endoscopy;   Laterality: N/A;  . Flexible sigmoidoscopy  08/06/2011    Procedure: FLEXIBLE SIGMOIDOSCOPY;  Surgeon: Rogene Houston, MD;  Location: [facility];  Service: Endoscopy;  Laterality: N/A;  . Breast lumpectomy with needle localization Left 07/07/2013    Procedure: BREAST LUMPECTOMY WITH NEEDLE LOCALIZATION;  Surgeon: Merrie Roof, MD;  Location: [facility];  Service: General;  Laterality: Left;   Family History  Problem Relation Age of Onset  . Heart disease    . Arthritis    . Cancer    . Diabetes    . Kidney disease     History  Substance Use Topics  . Smoking status: Former Smoker -- 1.00 packs/day for 40 years    Types: Cigarettes    Quit date: 07/06/2007  . Smokeless tobacco: Never Used  . Alcohol Use: No   OB History    No data available     Review of Systems  Constitutional: Negative for fever.  HENT: Negative for congestion.   Eyes: Negative for visual disturbance.  Respiratory: Negative for shortness of breath.   Cardiovascular: Negative for chest pain.  Gastrointestinal: Negative for abdominal pain.  Genitourinary: Negative for dysuria.  Musculoskeletal: Positive for neck pain. Negative for back pain.  Skin: Negative for rash.  Neurological: Negative for weakness, numbness and headaches.  Psychiatric/Behavioral: Negative for confusion.      Allergies  Latex and Tape  Home Medications   Prior to Admission medications   Medication Sig Start Date End Date Taking? Authorizing Provider  albuterol (PROVENTIL) (2.5 MG/3ML) 0.083% nebulizer solution Take 2.5 mg by nebulization every 4 (four) hours as needed. For shortness of breath    Yes Historical Provider, MD  albuterol (PROVENTIL,VENTOLIN) 90 MCG/ACT inhaler Inhale 2 puffs into the lungs every 4 (four) hours as needed for wheezing or shortness of breath. For shortness of breath   Yes Historical Provider, MD  aspirin EC 81 MG tablet Take 81 mg by mouth every morning.    Yes Historical Provider,  MD  atorvastatin (LIPITOR) 20 MG tablet Take 20 mg by mouth daily. 05/14/14  Yes Historical Provider, MD  cloNIDine (CATAPRES) 0.1 MG tablet Take 0.1 mg by mouth daily.  07/25/13  Yes Historical Provider, MD  clorazepate (TRANXENE) 7.5 MG tablet Take 7.5 mg by mouth 3 (three) times daily.    Yes Historical Provider, MD  diltiazem (CARDIZEM CD) 300 MG 24 hr capsule Take 300 mg by mouth daily.  07/18/13  Yes Historical Provider, MD  fenofibrate (TRICOR) 145 MG tablet Take 145 mg by mouth every morning.    Yes Historical Provider, MD  furosemide (LASIX) 40 MG tablet Take 40 mg by mouth daily as needed for fluid.  07/20/14  Yes Historical Provider, MD  hydrochlorothiazide (HYDRODIURIL) 25 MG tablet Take 25 mg by mouth every morning.  06/27/12  Yes Historical Provider, MD  HYDROcodone-acetaminophen (NORCO) 10-325 MG per tablet Take 1 tablet by mouth every 6 (six) hours as needed for moderate pain or severe pain.  06/27/14  Yes Historical Provider, MD  levothyroxine (SYNTHROID, LEVOTHROID) 50 MCG tablet Take 50 mcg by mouth daily before breakfast.  07/20/14  Yes Historical Provider, MD  lisinopril (PRINIVIL,ZESTRIL) 20 MG tablet Take 20 mg by mouth daily.  07/20/14  Yes Historical Provider, MD  losartan (COZAAR) 100 MG tablet Take 100 mg by mouth every morning.  08/09/12  Yes Historical Provider, MD  nitroGLYCERIN (NITROSTAT) 0.4 MG SL tablet Place 0.4 mg under the tongue every 5 (five) minutes as needed.     Yes Historical Provider, MD  potassium chloride SA (K-DUR,KLOR-CON) 20 MEQ tablet Take 20 mEq by mouth daily as needed (when Lasix is taken).  04/22/13  Yes Historical Provider, MD  HYDROmorphone (DILAUDID) 4 MG tablet Take 1 tablet (4 mg total) by mouth every 6 (six) hours as needed for severe pain. 07/20/14   Fredia Sorrow, MD  levothyroxine (SYNTHROID, LEVOTHROID) 25 MCG tablet Take 1 tablet (25 mcg total) by mouth daily before breakfast. Patient not taking: Reported on 07/20/2014 08/10/11 07/20/14  Lucia Gaskins,  MD   BP 185/90 mmHg  Pulse 73  Temp(Src) 98.2 F (36.8 C) (Oral)  Resp 20  Ht 5\' 4"  (1.626 m)  Wt 195 lb (88.451 kg)  BMI 33.46 kg/m2  SpO2 100% Physical Exam  Constitutional: She is oriented to person, place, and time. She appears well-developed and well-nourished. No distress.  HENT:  Head: Normocephalic and atraumatic.  Eyes: Conjunctivae and EOM are normal. Pupils are equal, round, and reactive to light.  Neck: Normal range of motion.  Cardiovascular: Normal rate, regular rhythm and normal heart sounds.   No murmur heard. Pulmonary/Chest: Effort normal and breath sounds normal.  Abdominal: Soft. Bowel sounds are normal. There is no tenderness.  Musculoskeletal: Normal range of motion. She exhibits tenderness.  Tenderness to palpation at  the base of the neck. Somewhat midline. Somewhat bilateral. No erythema no significant swelling. Bit of kyphosis.  Neurological: She is alert and oriented to person, place, and time. No cranial nerve deficit. She exhibits normal muscle tone. Coordination normal.  Skin: Skin is warm. No rash noted.  Nursing note and vitals reviewed.   ED Course  Procedures (including critical care time) Labs Review Labs Reviewed - No data to display  Imaging Review Ct Cervical Spine Wo Contrast  07/20/2014   CLINICAL DATA:  Neck pain and popping sensations for the past week. No known injury.  EXAM: CT CERVICAL SPINE WITHOUT CONTRAST  TECHNIQUE: Multidetector CT imaging of the cervical spine was performed without intravenous contrast. Multiplanar CT image reconstructions were also generated.  COMPARISON:  None.  FINDINGS: Straightening of the normal cervical lordosis. Facet degenerative changes in the upper and mid cervical spine with associated grade 1 anterolisthesis at the C3-4 level. Anterior and posterior spur formation at multiple levels. No visible disc herniations. 9 mm right lobe thyroid nodule. Biapical pleural and parenchymal scarring and mild bullous  change.  IMPRESSION: 1. Cervical spine degenerative changes. 2. Straightening of the normal cervical lordosis. 3. 9 mm right lobe thyroid nodule, too small to characterize, but most likely benign in the absence of known clinical risk factors for thyroid carcinoma.   Electronically Signed   By: Claudie Revering M.D.   On: 07/20/2014 21:08     EKG Interpretation None      MDM   Final diagnoses:  Neck pain    Patient with five-day history of neck pain at the base of the neck there is no injury. Increased pain with motion feeling some popping cracks in that area. Has a burning sensation. CT of the neck ordered to evaluate further. No neuro focal deficits. No headache. No injury.  CT scan of the of the neck without any acute ab normality's. Plenty degenerative changes. Thyroid nodule present can follow-up with her record Dr. for that. We'll continued his treatment with pain medication. No evidence of any neuro focal deficits at this point in time.  Fredia Sorrow, MD 07/20/14 2122

## 2014-07-20 NOTE — ED Notes (Signed)
Neck pain for 5 days, No injury ,increased pain with motion."burns and pops and cracks"

## 2014-07-20 NOTE — Discharge Instructions (Signed)
Continue current medications. Supplement with the hydromorphone orally as needed. Make an appointment probably regular Dr. CT scan shows no significant acute changes there are a lot of degenerative changes in the neck. Return for any new or worse symptoms.

## 2014-07-27 ENCOUNTER — Encounter (HOSPITAL_COMMUNITY): Payer: Self-pay | Admitting: *Deleted

## 2014-07-27 ENCOUNTER — Emergency Department (HOSPITAL_COMMUNITY)
Admission: EM | Admit: 2014-07-27 | Discharge: 2014-07-27 | Disposition: A | Discharge status: Home / Self Care | Payer: Medicare Other | Attending: Emergency Medicine | Admitting: Emergency Medicine

## 2014-07-27 DIAGNOSIS — I10 Essential (primary) hypertension: Secondary | ICD-10-CM | POA: Insufficient documentation

## 2014-07-27 DIAGNOSIS — M25511 Pain in right shoulder: Secondary | ICD-10-CM | POA: Insufficient documentation

## 2014-07-27 DIAGNOSIS — Z87891 Personal history of nicotine dependence: Secondary | ICD-10-CM | POA: Diagnosis not present

## 2014-07-27 DIAGNOSIS — Z7982 Long term (current) use of aspirin: Secondary | ICD-10-CM | POA: Diagnosis not present

## 2014-07-27 DIAGNOSIS — I2510 Atherosclerotic heart disease of native coronary artery without angina pectoris: Secondary | ICD-10-CM | POA: Insufficient documentation

## 2014-07-27 DIAGNOSIS — M25512 Pain in left shoulder: Secondary | ICD-10-CM | POA: Diagnosis not present

## 2014-07-27 DIAGNOSIS — R011 Cardiac murmur, unspecified: Secondary | ICD-10-CM | POA: Insufficient documentation

## 2014-07-27 DIAGNOSIS — J449 Chronic obstructive pulmonary disease, unspecified: Secondary | ICD-10-CM | POA: Insufficient documentation

## 2014-07-27 DIAGNOSIS — Z79899 Other long term (current) drug therapy: Secondary | ICD-10-CM | POA: Insufficient documentation

## 2014-07-27 DIAGNOSIS — G8929 Other chronic pain: Secondary | ICD-10-CM | POA: Diagnosis not present

## 2014-07-27 DIAGNOSIS — Z9861 Coronary angioplasty status: Secondary | ICD-10-CM | POA: Insufficient documentation

## 2014-07-27 DIAGNOSIS — Z91040 Latex allergy status: Secondary | ICD-10-CM | POA: Diagnosis not present

## 2014-07-27 DIAGNOSIS — M542 Cervicalgia: Secondary | ICD-10-CM | POA: Diagnosis not present

## 2014-07-27 DIAGNOSIS — Z8673 Personal history of transient ischemic attack (TIA), and cerebral infarction without residual deficits: Secondary | ICD-10-CM | POA: Diagnosis not present

## 2014-07-27 DIAGNOSIS — E785 Hyperlipidemia, unspecified: Secondary | ICD-10-CM | POA: Diagnosis not present

## 2014-07-27 DIAGNOSIS — E669 Obesity, unspecified: Secondary | ICD-10-CM | POA: Insufficient documentation

## 2014-07-27 MED ORDER — METHYLPREDNISOLONE SODIUM SUCC 125 MG IJ SOLR
80.0000 mg | Freq: Once | INTRAMUSCULAR | Status: AC
  Administered 2014-07-27: 80 mg via INTRAMUSCULAR
  Filled 2014-07-27: qty 2

## 2014-07-27 MED ORDER — BETAMETHASONE SOD PHOS & ACET 6 (3-3) MG/ML IJ SUSP: 12.0000 mg | Freq: Once | INTRAMUSCULAR | Status: DC

## 2014-07-27 NOTE — ED Notes (Signed)
Patient stated did not want to wait 15 minutes to be discharged after receiving IM shot.

## 2014-07-27 NOTE — ED Provider Notes (Signed)
CSN: [id number]     Arrival date & time 07/27/14  1838 History   First MD Initiated Contact with Patient 07/27/14 1905     Chief Complaint  Patient presents with  . Shoulder Pain     (Consider location/radiation/quality/duration/timing/severity/associated sxs/prior Treatment) Patient is a 64 y.o. female presenting with shoulder pain. The history is provided by the patient.  Shoulder Pain Location:  Shoulder Injury: no   Shoulder location:  L shoulder and R shoulder Pain details:    Quality:  Aching and shooting  SHELBI VACCARO is a 64 y.o. female who presents to the ED with bilateral shoulder pain that started 3 weeks ago. She was evaluated here 5/6 for same and had a CT of her cervical spine that showed no acute abnormality but it did show degenerative changes. She was treated for pain with Rx for 20 Dilaudid and was to follow up with her PCP.   Past Medical History  Diagnosis Date  . COPD (chronic obstructive pulmonary disease)   . Hypertension   . Hyperlipidemia   . Obesity   . Chest tightness   . Tobacco use disorder     50 pack years; questionably discontinued in 2010  . 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 Heart murmur   . Coronary artery disease   . Carotid artery dissection     left  . Stroke   . Wears glasses    Past Surgical History  Procedure Laterality Date  . Splenectomy    . Colonoscopy  2011  . Abdominal hysterectomy    . Cholecystectomy    . Breast excisional biopsy      Left  . Larynx surgery      Polyps excised  . Laparotomy    . Carotid stent    . Appendectomy    . Cardiac stent x 2      not in heart-onlt head  . Esophagogastroduodenoscopy  08/06/2011    Procedure: ESOPHAGOGASTRODUODENOSCOPY (EGD);  Surgeon: Rogene Houston,  MD;  Location: [facility];  Service: Endoscopy;  Laterality: N/A;  . Flexible sigmoidoscopy  08/06/2011    Procedure: FLEXIBLE SIGMOIDOSCOPY;  Surgeon: Rogene Houston, MD;  Location: [facility];  Service: Endoscopy;  Laterality: N/A;  . Breast lumpectomy with needle localization Left 07/07/2013    Procedure: BREAST LUMPECTOMY WITH NEEDLE LOCALIZATION;  Surgeon: Merrie Roof, MD;  Location: [facility];  Service: General;  Laterality: Left;   Family History  Problem Relation Age of Onset  . Heart disease    . Arthritis    . Cancer    . Diabetes    . Kidney disease     History  Substance Use Topics  . Smoking status: Former Smoker -- 1.00 packs/day for 40 years    Types: Cigarettes    Quit date: 07/06/2007  . Smokeless tobacco: Never Used  . Alcohol Use: No   OB History    No data available     Review of Systems Negative except as stated in HPI   Allergies  Latex and Tape  Home Medications   Prior to Admission medications   Medication Sig Start Date End Date Taking? Authorizing Provider  albuterol (PROVENTIL) (2.5 MG/3ML) 0.083% nebulizer solution Take 2.5 mg by nebulization every 4 (four) hours as needed.  For shortness of breath     Historical Provider, MD  albuterol (PROVENTIL,VENTOLIN) 90 MCG/ACT inhaler Inhale 2 puffs into the lungs every 4 (four) hours as needed for wheezing or shortness of breath. For shortness of breath    Historical Provider, MD  aspirin EC 81 MG tablet Take 81 mg by mouth every morning.     Historical Provider, MD  atorvastatin (LIPITOR) 20 MG tablet Take 20 mg by mouth daily. 05/14/14   Historical Provider, MD  cloNIDine (CATAPRES) 0.1 MG tablet Take 0.1 mg by mouth daily.  07/25/13   Historical Provider, MD  clorazepate (TRANXENE) 7.5 MG tablet Take 7.5 mg by mouth 3 (three) times daily.     Historical Provider, MD  diltiazem (CARDIZEM CD) 300 MG 24 hr capsule Take 300 mg by mouth daily.  07/18/13   Historical Provider, MD   fenofibrate (TRICOR) 145 MG tablet Take 145 mg by mouth every morning.     Historical Provider, MD  furosemide (LASIX) 40 MG tablet Take 40 mg by mouth daily as needed for fluid.  07/20/14   Historical Provider, MD  hydrochlorothiazide (HYDRODIURIL) 25 MG tablet Take 25 mg by mouth every morning.  06/27/12   Historical Provider, MD  HYDROcodone-acetaminophen (NORCO) 10-325 MG per tablet Take 1 tablet by mouth every 6 (six) hours as needed for moderate pain or severe pain.  06/27/14   Historical Provider, MD  HYDROmorphone (DILAUDID) 4 MG tablet Take 1 tablet (4 mg total) by mouth every 6 (six) hours as needed for severe pain. 07/20/14   Fredia Sorrow, MD  levothyroxine (SYNTHROID, LEVOTHROID) 25 MCG tablet Take 1 tablet (25 mcg total) by mouth daily before breakfast. Patient not taking: Reported on 07/20/2014 08/10/11 07/20/14  Lucia Gaskins, MD  levothyroxine (SYNTHROID, LEVOTHROID) 50 MCG tablet Take 50 mcg by mouth daily before breakfast.  07/20/14   Historical Provider, MD  lisinopril (PRINIVIL,ZESTRIL) 20 MG tablet Take 20 mg by mouth daily.  07/20/14   Historical Provider, MD  losartan (COZAAR) 100 MG tablet Take 100 mg by mouth every morning.  08/09/12   Historical Provider, MD  nitroGLYCERIN (NITROSTAT) 0.4 MG SL tablet Place 0.4 mg under the tongue every 5 (five) minutes as needed.      Historical Provider, MD  potassium chloride SA (K-DUR,KLOR-CON) 20 MEQ tablet Take 20 mEq by mouth daily as needed (when Lasix is taken).  04/22/13   Historical Provider, MD   BP 146/93 mmHg  Pulse 79  Temp(Src) 97.9 F (36.6 C) (Oral)  Resp 22  Ht 5\' 4"  (1.626 m)  Wt 190 lb (86.183 kg)  BMI 32.60 kg/m2  SpO2 98% Physical Exam  Constitutional: She is oriented to person, place, and time. She appears well-developed and well-nourished.  HENT:  Head: Normocephalic.  Eyes: Conjunctivae and EOM are normal.  Neck: Neck supple.  Pain with range of motion  Cardiovascular: Normal rate.   Pulmonary/Chest: Effort  normal.  Abdominal: Soft.  Musculoskeletal: Normal range of motion.  Tender over both shoulder with range of motion and palpation. Radial pulses 2+ bilateral, equal grips.   Neurological: She is alert and oriented to person, place, and time. No cranial nerve deficit.  Skin: Skin is warm and dry.  Psychiatric: She has a normal mood and affect. Her behavior is normal.  Nursing note and vitals reviewed.   ED Course  Procedures  Dr. Eulis Foster in to examine the patient. Will give cortisone injection and she will follow up with Dr. Aline Brochure.  MDM  63  y.o. female with bilateral shoulder pain and neck pain that is chronic. She is still taking the Dilaudid from her previous visit. She complains of burning in her neck and shoulders. Treated with Solumedrol injection and she will follow up with ortho as planned.   Final diagnoses:  Right shoulder pain      [name] Murrain, NP 07/27/14 2017  Daleen Bo, MD 07/28/14 [phone]

## 2014-07-27 NOTE — ED Provider Notes (Signed)
  Face-to-face evaluation   History: Recurrent bilateral shoulder pain, characterized by popping sensation when she moves her shoulders, in the upper back, bilaterally. No significant pain with neck motion. Evaluation here with CT of the neck indicated arthritis but no acute abnormalities were seen. She is unable to have a MRI because of "stents in her head ". She has been previously treated with shoulder injections, with improvement for the same problem.  Physical exam: Alert, calm, cooperative. Somewhat limited range of motion both shoulders secondary to upper back pain. No significant limitation of motion of the neck.  Medical screening examination/treatment/procedure(s) were conducted as a shared visit with non-physician practitioner(s) and myself.  I personally evaluated the patient during the encounter  Daleen Bo, MD 07/28/14 [phone]

## 2014-07-27 NOTE — Discharge Instructions (Signed)
Follow up with Dr. Aline Brochure, return as needed.

## 2014-07-27 NOTE — ED Notes (Addendum)
Shoulder pain for 3 weeks, Seen here 5/6 for same. Says she is no better and now hurts in both shoulders.   "popping and cracking in my neck"  Increased pain with movement

## 2014-08-01 ENCOUNTER — Other Ambulatory Visit: Payer: Self-pay | Admitting: Family Medicine

## 2014-08-01 DIAGNOSIS — M542 Cervicalgia: Secondary | ICD-10-CM

## 2014-08-01 DIAGNOSIS — M5412 Radiculopathy, cervical region: Secondary | ICD-10-CM

## 2014-08-07 ENCOUNTER — Ambulatory Visit
Admission: RE | Admit: 2014-08-07 | Discharge: 2014-08-07 | Disposition: A | Discharge status: Home / Self Care | Payer: Medicare Other | Source: Ambulatory Visit | Attending: Family Medicine | Admitting: Family Medicine

## 2014-08-07 ENCOUNTER — Other Ambulatory Visit: Payer: Medicare Other

## 2014-08-07 DIAGNOSIS — M5412 Radiculopathy, cervical region: Secondary | ICD-10-CM

## 2014-08-07 DIAGNOSIS — M542 Cervicalgia: Secondary | ICD-10-CM

## 2014-08-07 MED ORDER — DIAZEPAM 5 MG PO TABS
10.0000 mg | ORAL_TABLET | Freq: Once | ORAL | Status: AC
  Administered 2014-08-07: 10 mg via ORAL

## 2014-08-07 MED ORDER — IOHEXOL 300 MG/ML  SOLN
1.0000 mL | Freq: Once | INTRAMUSCULAR | Status: AC | PRN
  Administered 2014-08-07: 1 mL via EPIDURAL

## 2014-08-07 MED ORDER — TRIAMCINOLONE ACETONIDE 40 MG/ML IJ SUSP (RADIOLOGY)
60.0000 mg | Freq: Once | INTRAMUSCULAR | Status: AC
  Administered 2014-08-07: 60 mg via EPIDURAL

## 2014-08-07 NOTE — Discharge Instructions (Signed)

## 2014-09-22 ENCOUNTER — Emergency Department (HOSPITAL_COMMUNITY)
Admission: EM | Admit: 2014-09-22 | Discharge: 2014-09-22 | Disposition: A | Discharge status: Home / Self Care | Payer: Medicare Other | Attending: Emergency Medicine | Admitting: Emergency Medicine

## 2014-09-22 ENCOUNTER — Encounter (HOSPITAL_COMMUNITY): Payer: Self-pay | Admitting: *Deleted

## 2014-09-22 DIAGNOSIS — I2510 Atherosclerotic heart disease of native coronary artery without angina pectoris: Secondary | ICD-10-CM | POA: Insufficient documentation

## 2014-09-22 DIAGNOSIS — R011 Cardiac murmur, unspecified: Secondary | ICD-10-CM | POA: Diagnosis not present

## 2014-09-22 DIAGNOSIS — E785 Hyperlipidemia, unspecified: Secondary | ICD-10-CM | POA: Insufficient documentation

## 2014-09-22 DIAGNOSIS — Z9861 Coronary angioplasty status: Secondary | ICD-10-CM | POA: Diagnosis not present

## 2014-09-22 DIAGNOSIS — Z8673 Personal history of transient ischemic attack (TIA), and cerebral infarction without residual deficits: Secondary | ICD-10-CM | POA: Diagnosis not present

## 2014-09-22 DIAGNOSIS — Z79899 Other long term (current) drug therapy: Secondary | ICD-10-CM | POA: Insufficient documentation

## 2014-09-22 DIAGNOSIS — Z7982 Long term (current) use of aspirin: Secondary | ICD-10-CM | POA: Insufficient documentation

## 2014-09-22 DIAGNOSIS — Z87891 Personal history of nicotine dependence: Secondary | ICD-10-CM | POA: Insufficient documentation

## 2014-09-22 DIAGNOSIS — Z91040 Latex allergy status: Secondary | ICD-10-CM | POA: Diagnosis not present

## 2014-09-22 DIAGNOSIS — J449 Chronic obstructive pulmonary disease, unspecified: Secondary | ICD-10-CM | POA: Diagnosis not present

## 2014-09-22 DIAGNOSIS — H109 Unspecified conjunctivitis: Secondary | ICD-10-CM | POA: Diagnosis not present

## 2014-09-22 DIAGNOSIS — I10 Essential (primary) hypertension: Secondary | ICD-10-CM | POA: Insufficient documentation

## 2014-09-22 DIAGNOSIS — H5710 Ocular pain, unspecified eye: Secondary | ICD-10-CM | POA: Diagnosis present

## 2014-09-22 DIAGNOSIS — E669 Obesity, unspecified: Secondary | ICD-10-CM | POA: Insufficient documentation

## 2014-09-22 MED ORDER — TETRACAINE HCL 0.5 % OP SOLN
1.0000 [drp] | Freq: Once | OPHTHALMIC | Status: AC
  Administered 2014-09-22: 2 [drp] via OPHTHALMIC
  Filled 2014-09-22: qty 2

## 2014-09-22 MED ORDER — TOBRAMYCIN 0.3 % OP SOLN
2.0000 [drp] | Freq: Once | OPHTHALMIC | Status: AC
  Administered 2014-09-22: 2 [drp] via OPHTHALMIC
  Filled 2014-09-22: qty 5

## 2014-09-22 MED ORDER — FLUORESCEIN SODIUM 1 MG OP STRP
1.0000 | ORAL_STRIP | Freq: Once | OPHTHALMIC | Status: AC
  Administered 2014-09-22: 1 via OPHTHALMIC
  Filled 2014-09-22: qty 1

## 2014-09-22 MED ORDER — HYDROCODONE-ACETAMINOPHEN 5-325 MG PO TABS: 1.0000 | ORAL_TABLET | ORAL | Status: DC | PRN

## 2014-09-22 MED ORDER — KETOROLAC TROMETHAMINE 10 MG PO TABS
10.0000 mg | ORAL_TABLET | Freq: Once | ORAL | Status: AC
  Administered 2014-09-22: 10 mg via ORAL
  Filled 2014-09-22: qty 1

## 2014-09-22 MED ORDER — MELOXICAM 15 MG PO TABS: 15.0000 mg | ORAL_TABLET | Freq: Every day | ORAL | Status: DC

## 2014-09-22 NOTE — Discharge Instructions (Signed)
Please wash your hands frequently. Please apply a cool compress to the right eye 2 or 3 times daily. Use 2 drops of tobramycin eye drops every 4 hours for 5 days. Please see an eye specialist for additional evaluation if not improved. Use mobic daily with food. Use norco for more severe pain. This may cause drowsiness, use with caution. Conjunctivitis Conjunctivitis is commonly called "pink eye." Conjunctivitis can be caused by bacterial or viral infection, allergies, or injuries. There is usually redness of the lining of the eye, itching, discomfort, and sometimes discharge. There may be deposits of matter along the eyelids. A viral infection usually causes a watery discharge, while a bacterial infection causes a yellowish, thick discharge. Pink eye is very contagious and spreads by direct contact. You may be given antibiotic eyedrops as part of your treatment. Before using your eye medicine, remove all drainage from the eye by washing gently with warm water and cotton balls. Continue to use the medication until you have awakened 2 mornings in a row without discharge from the eye. Do not rub your eye. This increases the irritation and helps spread infection. Use separate towels from other household members. Wash your hands with soap and water before and after touching your eyes. Use cold compresses to reduce pain and sunglasses to relieve irritation from light. Do not wear contact lenses or wear eye makeup until the infection is gone. SEEK MEDICAL CARE IF:   Your symptoms are not better after 3 days of treatment.  You have increased pain or trouble seeing.  The outer eyelids become very red or swollen. Document Released: 04/09/2004 Document Revised: 05/25/2011 Document Reviewed: 03/02/2005 [facility] Patient Information 2015 [place], [place]. This information is not intended to replace advice given to you by your health care provider. Make sure you discuss any questions you have with your health care  provider.

## 2014-09-22 NOTE — ED Provider Notes (Signed)
CSN: [id number]     Arrival date & time 09/22/14  1533 History   First MD Initiated Contact with Patient 09/22/14 1627     Chief Complaint  Patient presents with  . Eye Pain     (Consider location/radiation/quality/duration/timing/severity/associated sxs/prior Treatment) Patient is a 64 y.o. female presenting with eye pain. The history is provided by the patient.  Eye Pain This is a new problem. The current episode started yesterday. The problem occurs intermittently. The problem has been gradually worsening. Associated symptoms include headaches. Pertinent negatives include no fever or visual change. Exacerbated by: bright lights. Treatments tried: visine eye drops. The treatment provided no relief.    Past Medical History  Diagnosis Date  . COPD (chronic obstructive pulmonary disease)   . Hypertension   . Hyperlipidemia   . Obesity   . Chest tightness   . Tobacco use disorder     50 pack years; questionably discontinued in 2010  . 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 Heart murmur   . Coronary artery disease   . Carotid artery dissection     left  . Stroke   . Wears glasses    Past Surgical History  Procedure Laterality Date  . Splenectomy    . Colonoscopy  2011  . Abdominal hysterectomy    . Cholecystectomy    . Breast excisional biopsy      Left  . Larynx surgery      Polyps excised  . Laparotomy    . Carotid stent    . Appendectomy    . Cardiac stent x 2      not in heart-onlt head  . Esophagogastroduodenoscopy  08/06/2011    Procedure: ESOPHAGOGASTRODUODENOSCOPY (EGD);  Surgeon: Rogene Houston, MD;  [facility];  Service: Endoscopy;  Laterality: N/A;  . Flexible sigmoidoscopy  08/06/2011    Procedure: FLEXIBLE SIGMOIDOSCOPY;  Surgeon:  Rogene Houston, MD;  Location: [facility];  Service: Endoscopy;  Laterality: N/A;  . Breast lumpectomy with needle localization Left 07/07/2013    Procedure: BREAST LUMPECTOMY WITH NEEDLE LOCALIZATION;  Surgeon: Merrie Roof, MD;  Location: [place];  Service: General;  Laterality: Left;   Family History  Problem Relation Age of Onset  . Heart disease    . Arthritis    . Cancer    . Diabetes    . Kidney disease     History  Substance Use Topics  . Smoking status: Former Smoker -- 1.00 packs/day for 40 years    Types: Cigarettes    Quit date: 07/06/2007  . Smokeless tobacco: Never Used  . Alcohol Use: No   OB History    No data available     Review of Systems  Constitutional: Negative for fever.  Eyes: Positive for photophobia, pain, discharge and redness.  Neurological: Positive for headaches.  All other systems reviewed and are negative.     Allergies  Latex and Tape  Home Medications   Prior to Admission medications   Medication Sig Start Date End Date Taking? Authorizing Provider  albuterol (PROVENTIL) (2.5 MG/3ML) 0.083% nebulizer solution Take 2.5 mg by nebulization every 4 (four) hours as needed. For shortness of breath     Historical Provider, MD  albuterol (PROVENTIL,VENTOLIN) 90 MCG/ACT inhaler Inhale 2 puffs into the  lungs every 4 (four) hours as needed for wheezing or shortness of breath. For shortness of breath    Historical Provider, MD  aspirin EC 81 MG tablet Take 81 mg by mouth every morning.     Historical Provider, MD  atorvastatin (LIPITOR) 20 MG tablet Take 20 mg by mouth daily. 05/14/14   Historical Provider, MD  cloNIDine (CATAPRES) 0.1 MG tablet Take 0.1 mg by mouth daily.  07/25/13   Historical Provider, MD  clorazepate (TRANXENE) 7.5 MG tablet Take 7.5 mg by mouth 3 (three) times daily.     Historical Provider, MD  diltiazem (CARDIZEM CD) 300 MG 24 hr capsule Take 300 mg by mouth daily.  07/18/13   Historical Provider, MD   fenofibrate (TRICOR) 145 MG tablet Take 145 mg by mouth every morning.     Historical Provider, MD  furosemide (LASIX) 40 MG tablet Take 40 mg by mouth daily as needed for fluid.  07/20/14   Historical Provider, MD  hydrochlorothiazide (HYDRODIURIL) 25 MG tablet Take 25 mg by mouth every morning.  06/27/12   Historical Provider, MD  HYDROcodone-acetaminophen (NORCO) 10-325 MG per tablet Take 1 tablet by mouth every 6 (six) hours as needed for moderate pain or severe pain.  06/27/14   Historical Provider, MD  HYDROmorphone (DILAUDID) 4 MG tablet Take 1 tablet (4 mg total) by mouth every 6 (six) hours as needed for severe pain. 07/20/14   Fredia Sorrow, MD  levothyroxine (SYNTHROID, LEVOTHROID) 25 MCG tablet Take 1 tablet (25 mcg total) by mouth daily before breakfast. Patient not taking: Reported on 07/20/2014 08/10/11 07/20/14  Lucia Gaskins, MD  levothyroxine (SYNTHROID, LEVOTHROID) 50 MCG tablet Take 50 mcg by mouth daily before breakfast.  07/20/14   Historical Provider, MD  lisinopril (PRINIVIL,ZESTRIL) 20 MG tablet Take 20 mg by mouth daily.  07/20/14   Historical Provider, MD  losartan (COZAAR) 100 MG tablet Take 100 mg by mouth every morning.  08/09/12   Historical Provider, MD  nitroGLYCERIN (NITROSTAT) 0.4 MG SL tablet Place 0.4 mg under the tongue every 5 (five) minutes as needed.      Historical Provider, MD  potassium chloride SA (K-DUR,KLOR-CON) 20 MEQ tablet Take 20 mEq by mouth daily as needed (when Lasix is taken).  04/22/13   Historical Provider, MD   BP 186/83 mmHg  Pulse 86  Temp(Src) 98.4 F (36.9 C) (Oral)  Resp 18  Ht 5\' 4"  (1.626 m)  Wt 165 lb (74.844 kg)  BMI 28.31 kg/m2  SpO2 99% Physical Exam  Constitutional: She is oriented to person, place, and time. She appears well-developed and well-nourished.  Non-toxic appearance.  HENT:  Head: Normocephalic.  Right Ear: Tympanic membrane and external ear normal.  Left Ear: Tympanic membrane and external ear normal.  Eyes: EOM are  normal. Pupils are equal, round, and reactive to light. Lids are everted and swept, no foreign bodies found. Right eye exhibits discharge. Right eye exhibits no hordeolum. No foreign body present in the right eye. Left eye exhibits no discharge. Right conjunctiva is injected. Left conjunctiva is not injected. Left conjunctiva has no hemorrhage. No scleral icterus.  Neck: Normal range of motion. Neck supple. Carotid bruit is not present.  Cardiovascular: Normal rate, regular rhythm, normal heart sounds, intact distal pulses and normal pulses.   Pulmonary/Chest: Breath sounds normal. No respiratory distress.  Abdominal: Soft. Bowel sounds are normal. There is no tenderness. There is no guarding.  Musculoskeletal: Normal range of motion.  Lymphadenopathy:  Head (right side): No submandibular adenopathy present.       Head (left side): No submandibular adenopathy present.    She has no cervical adenopathy.  Neurological: She is alert and oriented to person, place, and time. She has normal strength. No cranial nerve deficit or sensory deficit.  Skin: Skin is warm and dry.  Psychiatric: She has a normal mood and affect. Her speech is normal.  Nursing note and vitals reviewed.   ED Course  Procedures (including critical care time) Labs Review Labs Reviewed - No data to display  Imaging Review No results found.   EKG Interpretation None      MDM  No fb seen under lid, or on the conjunctiva. B/P 186/93, otherwise vital signs stable.  Exam suggest conjunctivitis. Pt will use cool compress, and tobramycin eye drops. Rx for mobic and norco given. Pt to see opthalmology if not improving.   Final diagnoses:  None    *I have reviewed nursing notes, vital signs, and all appropriate lab and imaging results for this patient.[address] PA-C 09/22/14 1717  Nat Christen, MD 09/22/14 [phone]

## 2014-09-22 NOTE — ED Notes (Signed)
Patient had debris fly into eye yesterday.  States feels like something is scratching the lens.  Vision blurred. Eye is reddened.

## 2014-11-03 ENCOUNTER — Emergency Department (HOSPITAL_COMMUNITY): Payer: Medicare Other

## 2014-11-03 ENCOUNTER — Encounter (HOSPITAL_COMMUNITY): Payer: Self-pay | Admitting: Cardiology

## 2014-11-03 ENCOUNTER — Emergency Department (HOSPITAL_COMMUNITY)
Admission: EM | Admit: 2014-11-03 | Discharge: 2014-11-03 | Discharge status: Against Medical Advice | Payer: Medicare Other | Attending: Emergency Medicine | Admitting: Emergency Medicine

## 2014-11-03 ENCOUNTER — Other Ambulatory Visit: Payer: Self-pay

## 2014-11-03 DIAGNOSIS — R109 Unspecified abdominal pain: Secondary | ICD-10-CM | POA: Diagnosis present

## 2014-11-03 DIAGNOSIS — Z8673 Personal history of transient ischemic attack (TIA), and cerebral infarction without residual deficits: Secondary | ICD-10-CM | POA: Insufficient documentation

## 2014-11-03 DIAGNOSIS — R011 Cardiac murmur, unspecified: Secondary | ICD-10-CM | POA: Insufficient documentation

## 2014-11-03 DIAGNOSIS — Z973 Presence of spectacles and contact lenses: Secondary | ICD-10-CM | POA: Diagnosis not present

## 2014-11-03 DIAGNOSIS — J449 Chronic obstructive pulmonary disease, unspecified: Secondary | ICD-10-CM | POA: Insufficient documentation

## 2014-11-03 DIAGNOSIS — Z79899 Other long term (current) drug therapy: Secondary | ICD-10-CM | POA: Diagnosis not present

## 2014-11-03 DIAGNOSIS — Z7982 Long term (current) use of aspirin: Secondary | ICD-10-CM | POA: Diagnosis not present

## 2014-11-03 DIAGNOSIS — E785 Hyperlipidemia, unspecified: Secondary | ICD-10-CM | POA: Diagnosis not present

## 2014-11-03 DIAGNOSIS — R001 Bradycardia, unspecified: Secondary | ICD-10-CM | POA: Diagnosis not present

## 2014-11-03 DIAGNOSIS — Z791 Long term (current) use of non-steroidal anti-inflammatories (NSAID): Secondary | ICD-10-CM | POA: Diagnosis not present

## 2014-11-03 DIAGNOSIS — I2510 Atherosclerotic heart disease of native coronary artery without angina pectoris: Secondary | ICD-10-CM | POA: Diagnosis not present

## 2014-11-03 DIAGNOSIS — Z87891 Personal history of nicotine dependence: Secondary | ICD-10-CM | POA: Diagnosis not present

## 2014-11-03 DIAGNOSIS — I10 Essential (primary) hypertension: Secondary | ICD-10-CM | POA: Insufficient documentation

## 2014-11-03 DIAGNOSIS — E669 Obesity, unspecified: Secondary | ICD-10-CM | POA: Diagnosis not present

## 2014-11-03 DIAGNOSIS — Z91040 Latex allergy status: Secondary | ICD-10-CM | POA: Insufficient documentation

## 2014-11-03 LAB — CBC
HCT: 41.3 % (ref 36.0–46.0)
Hemoglobin: 13.8 g/dL (ref 12.0–15.0)
MCH: 29.6 pg (ref 26.0–34.0)
MCHC: 33.4 g/dL (ref 30.0–36.0)
MCV: 88.4 fL (ref 78.0–100.0)
Platelets: 403 10*3/uL — ABNORMAL HIGH (ref 150–400)
RBC: 4.67 MIL/uL (ref 3.87–5.11)
RDW: 13.7 % (ref 11.5–15.5)
WBC: 11.5 10*3/uL — AB (ref 4.0–10.5)

## 2014-11-03 LAB — COMPREHENSIVE METABOLIC PANEL
ALK PHOS: 45 U/L (ref 38–126)
ALT: 21 U/L (ref 14–54)
AST: 22 U/L (ref 15–41)
Albumin: 3.9 g/dL (ref 3.5–5.0)
Anion gap: 8 (ref 5–15)
BILIRUBIN TOTAL: 0.3 mg/dL (ref 0.3–1.2)
BUN: 17 mg/dL (ref 6–20)
CALCIUM: 9.9 mg/dL (ref 8.9–10.3)
CO2: 29 mmol/L (ref 22–32)
CREATININE: 0.81 mg/dL (ref 0.44–1.00)
Chloride: 105 mmol/L (ref 101–111)
GFR calc Af Amer: >60 mL/min (ref >60)
GLUCOSE: 135 mg/dL — AB (ref 65–99)
POTASSIUM: 3.3 mmol/L — AB (ref 3.5–5.1)
Sodium: 142 mmol/L (ref 135–145)
TOTAL PROTEIN: 6.9 g/dL (ref 6.5–8.1)

## 2014-11-03 LAB — URINALYSIS, ROUTINE W REFLEX MICROSCOPIC
Bilirubin Urine: NEGATIVE
GLUCOSE, UA: NEGATIVE mg/dL
Hgb urine dipstick: NEGATIVE
Ketones, ur: NEGATIVE mg/dL
LEUKOCYTES UA: NEGATIVE
Nitrite: NEGATIVE
PH: 5.5 (ref 5.0–8.0)
PROTEIN: NEGATIVE mg/dL
Urobilinogen, UA: 0.2 mg/dL (ref 0.0–1.0)

## 2014-11-03 LAB — TROPONIN I: Troponin I: <0.03 ng/mL (ref <0.031)

## 2014-11-03 LAB — LIPASE, BLOOD: Lipase: 21 U/L — ABNORMAL LOW (ref 22–51)

## 2014-11-03 LAB — TSH: TSH: 1.167 u[IU]/mL (ref 0.350–4.500)

## 2014-11-03 MED ORDER — FENTANYL CITRATE (PF) 100 MCG/2ML IJ SOLN
50.0000 ug | Freq: Once | INTRAMUSCULAR | Status: AC
Start: 2014-11-03 — End: 2014-11-03
  Administered 2014-11-03: 50 ug via INTRAVENOUS
  Filled 2014-11-03: qty 2

## 2014-11-03 NOTE — ED Notes (Signed)
MD at bedside. 

## 2014-11-03 NOTE — ED Notes (Signed)
Patient ambulatory to restroom. Steady gait, no deficits

## 2014-11-03 NOTE — ED Notes (Signed)
Patient verbalizes understanding of AMA. Patient ambulatory out of department at this time.

## 2014-11-03 NOTE — ED Provider Notes (Signed)
CSN: [id number]     Arrival date & time 11/03/14  1648 History   First MD Initiated Contact with Patient 11/03/14 1831     Chief Complaint  Patient presents with  . Abdominal Pain     (Consider location/radiation/quality/duration/timing/severity/associated sxs/prior Treatment) Patient is a 64 y.o. female presenting with abdominal pain. The history is provided by the patient.  Abdominal Pain Associated symptoms: no chest pain, no constipation, no diarrhea, no fever, no nausea and no shortness of breath    patient presents with right-sided abdominal pain. She's had for last day or 2. Degenerative right lower abdomen. No dysuria. No nausea vomiting or diarrhea. No diarrhea or constipation. Pain is dull and constant. Worse with certain movements. Worse with palpation. No fevers. No chills. No chest pain or lightheadedness. No recent change in her medications.  Past Medical History  Diagnosis Date  . COPD (chronic obstructive pulmonary disease)   . Hypertension   . Hyperlipidemia   . Obesity   . Chest tightness   . Tobacco use disorder     50 pack years; questionably discontinued in 2010  . 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 Heart murmur   . Coronary artery disease   . Carotid artery dissection     left  . Stroke   . Wears glasses    Past Surgical History  Procedure Laterality Date  . Splenectomy    . Colonoscopy  2011  . Abdominal hysterectomy    . Cholecystectomy    . Breast excisional biopsy      Left  . Larynx surgery      Polyps excised  . Laparotomy    . Carotid stent    . Appendectomy    . Cardiac stent x 2      not in heart-onlt head  . Esophagogastroduodenoscopy  08/06/2011    Procedure: ESOPHAGOGASTRODUODENOSCOPY (EGD);  Surgeon: Rogene Houston, MD;  [facility];  Service: Endoscopy;  Laterality: N/A;  . Flexible sigmoidoscopy  08/06/2011    Procedure: FLEXIBLE SIGMOIDOSCOPY;  Surgeon: Rogene Houston, MD;  [facility];  Service: Endoscopy;  Laterality: N/A;  . Breast lumpectomy with needle localization Left 07/07/2013    Procedure: BREAST LUMPECTOMY WITH NEEDLE LOCALIZATION;  Surgeon: Merrie Roof, MD;  Location: [place];  Service: General;  Laterality: Left;   Family History  Problem Relation Age of Onset  . Heart disease    . Arthritis    . Cancer    . Diabetes    . Kidney disease     Social History  Substance Use Topics  . Smoking status: Former Smoker -- 1.00 packs/day for 40 years    Types: Cigarettes    Quit date: 07/06/2007  . Smokeless tobacco: Never Used  . Alcohol Use: No   OB History    No data available     Review of Systems  Constitutional: Negative for fever, activity change and appetite change.  Eyes: Negative for pain.  Respiratory: Negative for chest tightness and shortness of breath.   Cardiovascular: Negative for chest pain and leg swelling.  Gastrointestinal: Positive for abdominal pain. Negative for nausea, diarrhea and constipation.  Genitourinary: Negative for flank pain.  Musculoskeletal: Negative for back pain and neck stiffness.  Skin: Negative for rash.  Neurological: Negative for weakness, numbness and headaches.  Psychiatric/Behavioral: Negative for behavioral problems.      Allergies  Latex and Tape  Home Medications   Prior to Admission medications   Medication Sig Start Date End Date Taking? Authorizing Provider  albuterol (PROVENTIL) (2.5 MG/3ML) 0.083% nebulizer solution Take 2.5 mg by nebulization every 4 (four) hours as needed. For shortness of breath     Historical Provider, MD  albuterol (PROVENTIL,VENTOLIN) 90 MCG/ACT inhaler Inhale 2 puffs into the lungs every 4 (four) hours as needed for wheezing or shortness of  breath. For shortness of breath    Historical Provider, MD  aspirin EC 81 MG tablet Take 81 mg by mouth every morning.     Historical Provider, MD  atorvastatin (LIPITOR) 20 MG tablet Take 20 mg by mouth daily. 05/14/14   Historical Provider, MD  cloNIDine (CATAPRES) 0.1 MG tablet Take 0.1 mg by mouth daily.  07/25/13   Historical Provider, MD  clorazepate (TRANXENE) 7.5 MG tablet Take 7.5 mg by mouth 3 (three) times daily.     Historical Provider, MD  diltiazem (CARDIZEM CD) 300 MG 24 hr capsule Take 300 mg by mouth daily.  07/18/13   Historical Provider, MD  fenofibrate (TRICOR) 145 MG tablet Take 145 mg by mouth every morning.     Historical Provider, MD  furosemide (LASIX) 40 MG tablet Take 40 mg by mouth daily as needed for fluid.  07/20/14   Historical Provider, MD  hydrochlorothiazide (HYDRODIURIL) 25 MG tablet Take 25 mg by mouth every morning.  06/27/12   Historical Provider, MD  HYDROcodone-acetaminophen (NORCO/VICODIN) 5-325 MG per tablet Take 1 tablet by mouth every 4 (four) hours as needed. 09/22/14   Lily Kocher, PA-C  HYDROmorphone (DILAUDID) 4 MG tablet Take 1 tablet (4 mg total) by mouth every 6 (six) hours as needed for severe pain. 07/20/14   Fredia Sorrow, MD  levothyroxine (SYNTHROID, LEVOTHROID) 25 MCG tablet Take 1 tablet (25 mcg total) by mouth daily before breakfast. Patient not taking: Reported on 07/20/2014 08/10/11 07/20/14  Lucia Gaskins, MD  levothyroxine (SYNTHROID, LEVOTHROID) 50 MCG tablet Take 50 mcg by mouth daily before breakfast.  07/20/14   Historical Provider, MD  lisinopril (PRINIVIL,ZESTRIL) 20 MG tablet Take 20 mg by mouth daily.  07/20/14   Historical Provider, MD  losartan (COZAAR) 100 MG tablet Take 100 mg by mouth every morning.  08/09/12   Historical Provider, MD  meloxicam (MOBIC) 15 MG tablet Take 1 tablet (15 mg total) by mouth daily. 09/22/14   Lily Kocher, PA-C  nitroGLYCERIN (NITROSTAT) 0.4 MG SL tablet Place 0.4 mg under the tongue every 5 (five) minutes as  needed.      Historical Provider, MD  potassium chloride SA (K-DUR,KLOR-CON) 20 MEQ tablet Take 20 mEq by mouth daily as needed (when Lasix is taken).  04/22/13   Historical Provider, MD   BP 168/89 mmHg  Pulse 58  Temp(Src) 98 F (36.7 C) (Oral)  Resp 18  Ht 5\' 4"  (1.626 m)  Wt 170 lb (77.111 kg)  BMI 29.17 kg/m2  SpO2 98% Physical Exam  Constitutional: She appears well-developed and well-nourished.  HENT:  Head: Normocephalic.  Pulmonary/Chest: Effort normal.  Abdominal: Soft. There is tenderness. There is no rebound and no guarding.  Musculoskeletal: Normal range of motion.  Neurological: She is alert.  Skin: Skin is warm.    ED Course  Procedures (including critical care time) Labs Review Labs Reviewed  LIPASE, BLOOD - Abnormal; Notable for the following:  Lipase 21 (*)    All other components within normal limits  COMPREHENSIVE METABOLIC PANEL - Abnormal; Notable for the following:    Potassium 3.3 (*)    Glucose, Bld 135 (*)    All other components within normal limits  CBC - Abnormal; Notable for the following:    WBC 11.5 (*)    Platelets 403 (*)    All other components within normal limits  URINALYSIS, ROUTINE W REFLEX MICROSCOPIC (NOT AT [facility]) - Abnormal; Notable for the following:    Specific Gravity, Urine >1.030 (*)    All other components within normal limits  TROPONIN I  TSH    Imaging Review Dg Abd Acute W/chest  11/03/2014   CLINICAL DATA:  Right lower quadrant pain for 3 days.  EXAM: DG ABDOMEN ACUTE W/ 1V CHEST  COMPARISON:  Aug 05, 2011  FINDINGS: There is no evidence of dilated bowel loops or free intraperitoneal air. Moderate bowel content is identified in the colon. Surgical clips are identified in the upper abdomen unchanged. No radiopaque calculi or other significant radiographic abnormality is seen. Heart size and mediastinal contours are within normal limits. Both lungs are clear.  IMPRESSION: No bowel obstruction. Moderate bowel content  identified throughout colon.  No acute cardiopulmonary disease.   Electronically Signed   By: Abelardo Diesel M.D.   On: 11/03/2014 19:43   I have personally reviewed and evaluated these images and lab results as part of my medical decision-making.   EKG Interpretation   Date/Time:  Saturday November 03 2014 17:14:02 EDT Ventricular Rate:  44 PR Interval:  162 QRS Duration: 100 QT Interval:  510 QTC Calculation: 436 R Axis:   72 Text Interpretation:  Marked sinus bradycardia with marked sinus  arrhythmia Abnormal ECG also junctional bradycardia Reconfirmed by  Traevion Poehler  MD, Ovid Curd [phone]) on 11/04/2014 11:41:44 PM      MDM   Final diagnoses:  Abdominal pain, unspecified abdominal location  Junctional bradycardia    Patient presented with abdominal pain. Tenderness in lower abdomen. CT scan was pending however patient left AMA and was not willing to stay. Also was found to have junctional bradycardia. Rates are in the 87s. She does not appear be symptomatically with this and is on Cardizem. Patient is informed about the need for cardiology follow-up or she left AMA.   Davonna Belling, MD 11/04/14 2350

## 2014-11-03 NOTE — ED Notes (Signed)
Pt c/o RLQ pain that started Wednesday. Pt denies nausea, vomiting, fever.

## 2014-11-03 NOTE — ED Notes (Signed)
Patient states "im ready to go, i want to go home, i do not want the CT scan" EDP notified and patient to sign out AMA

## 2014-11-03 NOTE — ED Notes (Signed)
RLQ abdominal pain since Wednesday.

## 2014-11-16 ENCOUNTER — Ambulatory Visit (HOSPITAL_COMMUNITY)
Admission: RE | Admit: 2014-11-16 | Discharge: 2014-11-16 | Disposition: A | Discharge status: Home / Self Care | Payer: Medicare Other | Source: Ambulatory Visit | Attending: Family Medicine | Admitting: Family Medicine

## 2014-11-16 ENCOUNTER — Other Ambulatory Visit (HOSPITAL_COMMUNITY): Payer: Self-pay | Admitting: Family Medicine

## 2014-11-16 ENCOUNTER — Other Ambulatory Visit (HOSPITAL_COMMUNITY)
Admission: RE | Admit: 2014-11-16 | Discharge: 2014-11-16 | Disposition: A | Discharge status: Home / Self Care | Payer: Medicare Other | Source: Ambulatory Visit | Attending: Family Medicine | Admitting: Family Medicine

## 2014-11-16 DIAGNOSIS — Z853 Personal history of malignant neoplasm of breast: Secondary | ICD-10-CM | POA: Diagnosis not present

## 2014-11-16 DIAGNOSIS — R1084 Generalized abdominal pain: Secondary | ICD-10-CM | POA: Insufficient documentation

## 2014-11-16 DIAGNOSIS — Z9081 Acquired absence of spleen: Secondary | ICD-10-CM | POA: Diagnosis not present

## 2014-11-16 DIAGNOSIS — R932 Abnormal findings on diagnostic imaging of liver and biliary tract: Secondary | ICD-10-CM | POA: Insufficient documentation

## 2014-11-16 DIAGNOSIS — R1900 Intra-abdominal and pelvic swelling, mass and lump, unspecified site: Secondary | ICD-10-CM | POA: Diagnosis not present

## 2014-11-16 DIAGNOSIS — R109 Unspecified abdominal pain: Secondary | ICD-10-CM | POA: Insufficient documentation

## 2014-11-16 LAB — CBC WITH DIFFERENTIAL/PLATELET
Basophils Absolute: 0.1 10*3/uL (ref 0.0–0.1)
Basophils Relative: 1 % (ref 0–1)
EOS ABS: 0.2 10*3/uL (ref 0.0–0.7)
Eosinophils Relative: 2 % (ref 0–5)
HEMATOCRIT: 46.2 % — AB (ref 36.0–46.0)
Hemoglobin: 15.5 g/dL — ABNORMAL HIGH (ref 12.0–15.0)
LYMPHS ABS: 4.2 10*3/uL — AB (ref 0.7–4.0)
Lymphocytes Relative: 38 % (ref 12–46)
MCH: 29.7 pg (ref 26.0–34.0)
MCHC: 33.5 g/dL (ref 30.0–36.0)
MCV: 88.5 fL (ref 78.0–100.0)
MONOS PCT: 11 % (ref 3–12)
Monocytes Absolute: 1.2 10*3/uL — ABNORMAL HIGH (ref 0.1–1.0)
NEUTROS PCT: 48 % (ref 43–77)
Neutro Abs: 5.3 10*3/uL (ref 1.7–7.7)
PLATELETS: 390 10*3/uL (ref 150–400)
RBC: 5.22 MIL/uL — ABNORMAL HIGH (ref 3.87–5.11)
RDW: 13.4 % (ref 11.5–15.5)
WBC: 11 10*3/uL — ABNORMAL HIGH (ref 4.0–10.5)

## 2014-11-16 LAB — COMPREHENSIVE METABOLIC PANEL
ALT: 22 U/L (ref 14–54)
AST: 29 U/L (ref 15–41)
Albumin: 4.4 g/dL (ref 3.5–5.0)
Alkaline Phosphatase: 59 U/L (ref 38–126)
Anion gap: 6 (ref 5–15)
BUN: 18 mg/dL (ref 6–20)
CHLORIDE: 101 mmol/L (ref 101–111)
CO2: 31 mmol/L (ref 22–32)
CREATININE: 0.9 mg/dL (ref 0.44–1.00)
Calcium: 10.1 mg/dL (ref 8.9–10.3)
GFR calc non Af Amer: >60 mL/min (ref >60)
Glucose, Bld: 88 mg/dL (ref 65–99)
Potassium: 4.1 mmol/L (ref 3.5–5.1)
SODIUM: 138 mmol/L (ref 135–145)
Total Bilirubin: 0.6 mg/dL (ref 0.3–1.2)
Total Protein: 7.4 g/dL (ref 6.5–8.1)

## 2014-11-16 LAB — LIPASE, BLOOD: Lipase: 25 U/L (ref 22–51)

## 2014-11-16 MED ORDER — IOHEXOL 300 MG/ML  SOLN
100.0000 mL | Freq: Once | INTRAMUSCULAR | Status: AC | PRN
  Administered 2014-11-16: 100 mL via INTRAVENOUS

## 2014-11-16 MED ORDER — BARIUM SULFATE 2.1 % PO SUSP
ORAL | Status: AC
  Filled 2014-11-16: qty 2

## 2015-07-22 ENCOUNTER — Other Ambulatory Visit: Payer: Self-pay | Admitting: Family Medicine

## 2015-07-22 DIAGNOSIS — M542 Cervicalgia: Secondary | ICD-10-CM

## 2015-08-17 ENCOUNTER — Emergency Department (HOSPITAL_COMMUNITY)
Admission: EM | Admit: 2015-08-17 | Discharge: 2015-08-17 | Disposition: A | Discharge status: Home / Self Care | Payer: Medicare Other | Attending: Emergency Medicine | Admitting: Emergency Medicine

## 2015-08-17 ENCOUNTER — Emergency Department (HOSPITAL_COMMUNITY): Payer: Medicare Other

## 2015-08-17 ENCOUNTER — Encounter (HOSPITAL_COMMUNITY): Payer: Self-pay | Admitting: Emergency Medicine

## 2015-08-17 DIAGNOSIS — I10 Essential (primary) hypertension: Secondary | ICD-10-CM | POA: Diagnosis not present

## 2015-08-17 DIAGNOSIS — R109 Unspecified abdominal pain: Secondary | ICD-10-CM | POA: Diagnosis present

## 2015-08-17 DIAGNOSIS — R319 Hematuria, unspecified: Secondary | ICD-10-CM | POA: Insufficient documentation

## 2015-08-17 DIAGNOSIS — J449 Chronic obstructive pulmonary disease, unspecified: Secondary | ICD-10-CM | POA: Insufficient documentation

## 2015-08-17 DIAGNOSIS — E785 Hyperlipidemia, unspecified: Secondary | ICD-10-CM | POA: Insufficient documentation

## 2015-08-17 DIAGNOSIS — N390 Urinary tract infection, site not specified: Secondary | ICD-10-CM | POA: Diagnosis not present

## 2015-08-17 DIAGNOSIS — I2510 Atherosclerotic heart disease of native coronary artery without angina pectoris: Secondary | ICD-10-CM | POA: Diagnosis not present

## 2015-08-17 LAB — URINALYSIS, ROUTINE W REFLEX MICROSCOPIC
BILIRUBIN URINE: NEGATIVE
Glucose, UA: NEGATIVE mg/dL
KETONES UR: NEGATIVE mg/dL
NITRITE: NEGATIVE
PH: 5.5 (ref 5.0–8.0)
Specific Gravity, Urine: 1.025 (ref 1.005–1.030)

## 2015-08-17 LAB — URINE MICROSCOPIC-ADD ON

## 2015-08-17 MED ORDER — CEPHALEXIN 500 MG PO CAPS
500.0000 mg | ORAL_CAPSULE | Freq: Once | ORAL | Status: AC
Start: 2015-08-17 — End: 2015-08-17
  Administered 2015-08-17: 500 mg via ORAL
  Filled 2015-08-17: qty 1

## 2015-08-17 MED ORDER — HYDROCODONE-ACETAMINOPHEN 5-325 MG PO TABS: 1.0000 | ORAL_TABLET | ORAL | Status: DC | PRN

## 2015-08-17 MED ORDER — HYDROMORPHONE HCL 1 MG/ML IJ SOLN
1.0000 mg | Freq: Once | INTRAMUSCULAR | Status: AC
  Administered 2015-08-17: 1 mg via INTRAVENOUS
  Filled 2015-08-17: qty 1

## 2015-08-17 MED ORDER — CEPHALEXIN 500 MG PO CAPS: 500.0000 mg | ORAL_CAPSULE | Freq: Four times a day (QID) | ORAL | Status: DC

## 2015-08-17 MED ORDER — SODIUM CHLORIDE 0.9 % IV SOLN
INTRAVENOUS | Status: DC
  Administered 2015-08-17: 19:00:00 via INTRAVENOUS
  Filled 2015-08-17: qty 1000

## 2015-08-17 MED ORDER — ONDANSETRON HCL 4 MG/2ML IJ SOLN
4.0000 mg | Freq: Once | INTRAMUSCULAR | Status: AC
  Administered 2015-08-17: 4 mg via INTRAVENOUS
  Filled 2015-08-17: qty 2

## 2015-08-17 NOTE — ED Notes (Signed)
6 pack of vicodin given per MD order.

## 2015-08-17 NOTE — ED Notes (Signed)
Patient transported to CT 

## 2015-08-17 NOTE — ED Notes (Signed)
Patient c/o right flank pain that radiates into lower abd. Per patient pain and blood with urination. Patient reports hx of kidney stones in which this feels similar. Patient also reports some nausea and chills.

## 2015-08-17 NOTE — Discharge Instructions (Signed)
Flank Pain Flank pain refers to pain that is located on the side of the body between the upper abdomen and the back. The pain may occur over a short period of time (acute) or may be long-term or reoccurring (chronic). It may be mild or severe. Flank pain can be caused by many things. CAUSES  Some of the more common causes of flank pain include:  Muscle strains.   Muscle spasms.   A disease of your spine (vertebral disk disease).   A lung infection (pneumonia).   Fluid around your lungs (pulmonary edema).   A kidney infection.   Kidney stones.   A very painful skin rash caused by the chickenpox virus (shingles).   Gallbladder disease.  [place] care will depend on the cause of your pain. In general,  Rest as directed by your caregiver.  Drink enough fluids to keep your urine clear or pale yellow.  Only take over-the-counter or prescription medicines as directed by your caregiver. Some medicines may help relieve the pain.  Tell your caregiver about any changes in your pain.  Follow up with your caregiver as directed. SEEK IMMEDIATE MEDICAL CARE IF:   Your pain is not controlled with medicine.   You have new or worsening symptoms.  Your pain increases.   You have abdominal pain.   You have shortness of breath.   You have persistent nausea or vomiting.   You have swelling in your abdomen.   You feel faint or pass out.   You have blood in your urine.  You have a fever or persistent symptoms for more than 2-3 days.  You have a fever and your symptoms suddenly get worse. MAKE SURE YOU:   Understand these instructions.  Will watch your condition.  Will get help right away if you are not doing well or get worse.   This information is not intended to replace advice given to you by your health care provider. Make sure you discuss any questions you have with your health care provider.   Document Released: 04/23/2005 Document  Revised: 11/25/2011 Document Reviewed: 10/15/2011 Elsevier Interactive Patient Education 2016 [employer]  Urinary Tract Infection A urinary tract infection (UTI) can occur any place along the urinary tract. The tract includes the kidneys, ureters, bladder, and urethra. A type of germ called bacteria often causes a UTI. UTIs are often helped with antibiotic medicine.  HOME CARE   If given, take antibiotics as told by your doctor. Finish them even if you start to feel better.  Drink enough fluids to keep your pee (urine) clear or pale yellow.  Avoid tea, drinks with caffeine, and bubbly (carbonated) drinks.  Pee often. Avoid holding your pee in for a long time.  Pee before and after having sex (intercourse).  Wipe from front to back after you poop (bowel movement) if you are a woman. Use each tissue only once. GET HELP RIGHT AWAY IF:   You have back pain.  You have lower belly (abdominal) pain.  You have chills.  You feel sick to your stomach (nauseous).  You throw up (vomit).  Your burning or discomfort with peeing does not go away.  You have a fever.  Your symptoms are not better in 3 days. MAKE SURE YOU:   Understand these instructions.  Will watch your condition.  Will get help right away if you are not doing well or get worse.   This information is not intended to replace advice given to  you by your health care provider. Make sure you discuss any questions you have with your health care provider.   Document Released: 08/19/2007 Document Revised: 03/23/2014 Document Reviewed: 10/01/2011 Elsevier Interactive Patient Education [employer].

## 2015-08-17 NOTE — ED Provider Notes (Signed)
CSN: [id number]     Arrival date & time 08/17/15  1750 History   First MD Initiated Contact with Patient 08/17/15 1813     Chief Complaint  Patient presents with  . Flank Pain     (Consider location/radiation/quality/duration/timing/severity/associated sxs/prior Treatment) HPI   [name] is a 65 y.o. female presents with onset of right flank pain several hours ago, with urinary urgency and hematuria. She denies fever, chills or vomiting. She has had some nausea. She denies cough, shortness of breath, chest pain. She hasn't had any kidney stone problems recently. He denies weakness or dizziness. There are no other known modifying factors.   Past Medical History  Diagnosis Date  . COPD (chronic obstructive pulmonary disease) ([place])   . Hypertension   . Hyperlipidemia   . Obesity   . Chest tightness   . Tobacco use disorder     50 pack years; questionably discontinued in 2010  . 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 Heart murmur   . Coronary artery disease   . 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HCC)     left  . Stroke ([place])   . Wears glasses   . Renal disorder    Past Surgical History  Procedure Laterality Date  . Splenectomy    . Colonoscopy  2011  . Abdominal hysterectomy    . Cholecystectomy    . Breast excisional biopsy      Left  . Larynx surgery      Polyps excised  . Laparotomy    . Carotid stent    . Appendectomy    . Cardiac stent x 2      not in heart-onlt head  . Esophagogastroduodenoscopy  08/06/2011    Procedure: ESOPHAGOGASTRODUODENOSCOPY (EGD);  Surgeon: Rogene Houston, MD;  [facility];  Service: Endoscopy;  Laterality: N/A;  . Flexible sigmoidoscopy  08/06/2011    Procedure: FLEXIBLE SIGMOIDOSCOPY;  Surgeon: Rogene Houston,  MD;  Location: [facility];  Service: Endoscopy;  Laterality: N/A;  . Breast lumpectomy with needle localization Left 07/07/2013    Procedure: BREAST LUMPECTOMY WITH NEEDLE LOCALIZATION;  Surgeon: Merrie Roof, MD;  Location: [facility];  Service: General;  Laterality: Left;   Family History  Problem Relation Age of Onset  . Heart disease    . Arthritis    . Cancer    . Diabetes    . Kidney disease     Social History  Substance Use Topics  . Smoking status: Former Smoker -- 1.00 packs/day for 40 years    Types: Cigarettes    Quit date: 07/06/2007  . Smokeless tobacco: Never Used  . Alcohol Use: No   OB History    Gravida Para Term Preterm AB TAB SAB Ectopic Multiple Living   2 2 2       2      Review of Systems  All other systems reviewed and are negative.     Allergies  Tape and Latex  Home Medications   Prior to Admission medications   Medication Sig Start Date End Date Taking? Authorizing Provider  albuterol (PROVENTIL) (2.5 MG/3ML) 0.083% nebulizer solution Take 2.5 mg by nebulization every 4 (four) hours as needed. For shortness of breath    Yes Historical Provider, MD  albuterol (PROVENTIL,VENTOLIN) 90  MCG/ACT inhaler Inhale 2 puffs into the lungs every 4 (four) hours as needed for wheezing or shortness of breath. For shortness of breath   Yes Historical Provider, MD  aspirin EC 81 MG tablet Take 81 mg by mouth every morning.    Yes Historical Provider, MD  atorvastatin (LIPITOR) 20 MG tablet Take 20 mg by mouth daily. 05/14/14  Yes Historical Provider, MD  cloNIDine (CATAPRES) 0.1 MG tablet Take 0.1 mg by mouth daily.  07/25/13  Yes Historical Provider, MD  clorazepate (TRANXENE) 7.5 MG tablet Take 7.5 mg by mouth 3 (three) times daily.    Yes Historical Provider, MD  diltiazem (CARDIZEM CD) 300 MG 24 hr capsule Take 300 mg by mouth daily.  07/18/13  Yes Historical Provider, MD  estradiol (ESTRACE) 1 MG tablet Take 1 mg by mouth daily. 07/18/15  Yes  Historical Provider, MD  fenofibrate (TRICOR) 145 MG tablet Take 145 mg by mouth every morning.    Yes Historical Provider, MD  furosemide (LASIX) 40 MG tablet Take 40 mg by mouth daily as needed for fluid.  07/20/14  Yes Historical Provider, MD  hydrochlorothiazide (HYDRODIURIL) 25 MG tablet Take 25 mg by mouth every morning.  06/27/12  Yes Historical Provider, MD  levothyroxine (SYNTHROID, LEVOTHROID) 50 MCG tablet Take 50 mcg by mouth daily before breakfast.  07/20/14  Yes Historical Provider, MD  lisinopril (PRINIVIL,ZESTRIL) 20 MG tablet Take 20 mg by mouth daily.  07/20/14  Yes Historical Provider, MD  losartan (COZAAR) 100 MG tablet Take 100 mg by mouth every morning.  08/09/12  Yes Historical Provider, MD  nitroGLYCERIN (NITROSTAT) 0.4 MG SL tablet Place 0.4 mg under the tongue every 5 (five) minutes as needed.     Yes Historical Provider, MD  potassium chloride SA (K-DUR,KLOR-CON) 20 MEQ tablet Take 20 mEq by mouth daily as needed (when Lasix is taken). Reported on 08/17/2015 04/22/13  Yes Historical Provider, MD  cephALEXin (KEFLEX) 500 MG capsule Take 1 capsule (500 mg total) by mouth 4 (four) times daily. 08/17/15   Daleen Bo, MD  HYDROcodone-acetaminophen (NORCO) 5-325 MG tablet Take 1 tablet by mouth every 4 (four) hours as needed. 08/17/15   Daleen Bo, MD  HYDROcodone-acetaminophen (NORCO/VICODIN) 5-325 MG tablet Take 1-2 tablets by mouth every 4 (four) hours as needed. 08/17/15   Daleen Bo, MD  levothyroxine (SYNTHROID, LEVOTHROID) 25 MCG tablet Take 1 tablet (25 mcg total) by mouth daily before breakfast. Patient not taking: Reported on 07/20/2014 08/10/11 07/20/14  Lucia Gaskins, MD   BP 185/78 mmHg  Pulse 58  Temp(Src) 98.1 F (36.7 C) (Oral)  Resp 18  Ht 5\' 5"  (1.651 m)  Wt 185 lb (83.915 kg)  BMI 30.79 kg/m2  SpO2 100% Physical Exam  Constitutional: She is oriented to person, place, and time. She appears well-developed and well-nourished.  HENT:  Head: Normocephalic and  atraumatic.  Right Ear: External ear normal.  Left Ear: External ear normal.  Eyes: Conjunctivae and EOM are normal. Pupils are equal, round, and reactive to light.  Neck: Normal range of motion and phonation normal. Neck supple.  Cardiovascular: Normal rate, regular rhythm and normal heart sounds.   Pulmonary/Chest: Effort normal and breath sounds normal. She exhibits no bony tenderness.  Abdominal: Soft. There is no tenderness.  Genitourinary:  No costovertebral angle tenderness with percussion  Musculoskeletal: Normal range of motion.  Mild right lower lumbar tenderness to palpation. Normal range of motion back.  Neurological: She is alert and oriented to person, place, and  time. No cranial nerve deficit or sensory deficit. She exhibits normal muscle tone. Coordination normal.  Skin: Skin is warm, dry and intact.  Psychiatric: She has a normal mood and affect. Her behavior is normal. Judgment and thought content normal.  Nursing note and vitals reviewed.   ED Course  Procedures (including critical care time)  Medications  0.9 %  sodium chloride infusion ( Intravenous New Bag/Given 08/17/15 1845)  cephALEXin (KEFLEX) capsule 500 mg (not administered)  ondansetron (ZOFRAN) injection 4 mg (4 mg Intravenous Given 08/17/15 1847)  HYDROmorphone (DILAUDID) injection 1 mg (1 mg Intravenous Given 08/17/15 1847)    Patient Vitals for the past 24 hrs:  BP Temp Temp src Pulse Resp SpO2 Height Weight  08/17/15 1804 185/78 mmHg 98.1 F (36.7 C) Oral (!) 58 18 100 % 5\' 5"  (1.651 m) 185 lb (83.915 kg)    8:21 PM Reevaluation with update and discussion. After initial assessment and treatment, an updated evaluation reveals Her pain is better now. No further complaints. Findings discussed with patient, all questions answered. Tharon Bomar L    Labs Review Labs Reviewed  URINALYSIS, ROUTINE W REFLEX MICROSCOPIC (NOT AT [facility]) - Abnormal; Notable for the following:    Hgb urine dipstick SMALL (*)     Protein, ur TRACE (*)    Leukocytes, UA TRACE (*)    All other components within normal limits  URINE MICROSCOPIC-ADD ON - Abnormal; Notable for the following:    Squamous Epithelial / LPF 0-5 (*)    Bacteria, UA MANY (*)    All other components within normal limits  URINE CULTURE    Imaging Review Ct Renal Stone Study  08/17/2015  CLINICAL DATA:  Right flank pain and hematuria. Nephrolithiasis. Nausea and chills. EXAM: CT ABDOMEN AND PELVIS WITHOUT CONTRAST TECHNIQUE: Multidetector CT imaging of the abdomen and pelvis was performed following the standard protocol without IV contrast. COMPARISON:  11/16/2014 FINDINGS: Lower chest:  No acute findings. Hepatobiliary: No mass visualized on this un-enhanced exam. Mild hepatic steatosis again noted. Surgical clips seen from prior cholecystectomy. Mild dilatation of common bile duct is stable without definite intrahepatic biliary dilatation. Pancreas: No mass or inflammatory process identified on this un-enhanced exam. Spleen: Prior splenectomy with stable small hypertrophied accessory splenule in splenectomy bed. Adrenals/Urinary Tract: No evidence of urolithiasis or hydronephrosis. Unopacified urinary bladder is unremarkable in appearance. Stomach/Bowel: No evidence of obstruction, inflammatory process, or abnormal fluid collections. Vascular/Lymphatic: No pathologically enlarged lymph nodes. No evidence of abdominal aortic aneurysm. Aortic atherosclerotic calcification noted. Reproductive: Prior hysterectomy noted. Adnexal regions are unremarkable in appearance. Other: None. Musculoskeletal:  No suspicious bone lesions identified. IMPRESSION: No acute findings within the abdomen or pelvis. Stable mild hepatic steatosis. Electronically Signed   By: Earle Gell M.D.   On: 08/17/2015 19:41   I have personally reviewed and evaluated these images and lab results as part of my medical decision-making.   EKG Interpretation None      MDM   Final  diagnoses:  Right flank pain  Hematuria  Urinary tract infection with hematuria, site unspecified    Nonspecific right flank pain. CT negative for ureteral stone or other acute intra-abdominal abnormalities. Urinalysis somewhat abnormal. No clear clinical evidence for UTI. Small amount of blood on dipstick. Doubt pyelonephritis.  Nursing Notes Reviewed/ Care Coordinated Applicable Imaging Reviewed Interpretation of Laboratory Data incorporated into ED treatment  The patient appears reasonably screened and/or stabilized for discharge and I doubt any other medical condition or other [facility] requiring further screening,  evaluation, or treatment in the ED at this time prior to discharge.  Plan: Home Medications- Norco, Keflex; Home Treatments- rest, fluids; return here if the recommended treatment, does not improve the symptoms; Recommended follow up- PCP prn     Daleen Bo, MD 08/17/15 2023

## 2015-08-20 LAB — URINE CULTURE
Culture: >=100000 — AB
SPECIAL REQUESTS: NORMAL

## 2015-08-21 ENCOUNTER — Telehealth: Payer: Self-pay | Admitting: *Deleted

## 2015-08-21 MED FILL — Hydrocodone-Acetaminophen Tab 5-325 MG: ORAL | Qty: 6 | Status: AC

## 2015-08-21 NOTE — ED Notes (Signed)
Post ED Visit - Positive Culture Follow-up  Culture report reviewed by antimicrobial stewardship pharmacist:  []  Elenor Quinones, Pharm.D. []  Heide Guile, Pharm.D., BCPS []  Parks Neptune, Pharm.D. []  Alycia Rossetti, Pharm.D., BCPS []  [place], Pharm.D., BCPS, AAHIVP []  Legrand Como, Pharm.D., BCPS, AAHIVP [x]  Milus Glazier, Pharm.D. []  Stephens November, Pharm.D.  Positive urine culture Treated with Cephalexin, organism sensitive to the same and no further patient follow-up is required at this time.  Harlon Flor [facility] 08/21/2015, 10:04 AM

## 2015-09-13 ENCOUNTER — Ambulatory Visit
Admission: RE | Admit: 2015-09-13 | Discharge: 2015-09-13 | Disposition: A | Discharge status: Home / Self Care | Payer: Medicare Other | Source: Ambulatory Visit | Attending: Family Medicine | Admitting: Family Medicine

## 2015-09-13 DIAGNOSIS — M542 Cervicalgia: Secondary | ICD-10-CM

## 2015-09-13 MED ORDER — TRIAMCINOLONE ACETONIDE 40 MG/ML IJ SUSP (RADIOLOGY)
60.0000 mg | Freq: Once | INTRAMUSCULAR | Status: AC
  Administered 2015-09-13: 60 mg via EPIDURAL

## 2015-09-13 MED ORDER — IOPAMIDOL (ISOVUE-M 300) INJECTION 61%
1.0000 mL | Freq: Once | INTRAMUSCULAR | Status: AC | PRN
  Administered 2015-09-13: 1 mL via EPIDURAL

## 2015-09-13 NOTE — Discharge Instructions (Signed)

## 2015-09-30 ENCOUNTER — Observation Stay (HOSPITAL_COMMUNITY)
Admission: EM | Admit: 2015-09-30 | Discharge: 2015-10-03 | Disposition: A | Discharge status: Home / Self Care | Payer: Medicare Other | Attending: Internal Medicine | Admitting: Internal Medicine

## 2015-09-30 ENCOUNTER — Observation Stay (HOSPITAL_COMMUNITY): Payer: Medicare Other

## 2015-09-30 ENCOUNTER — Emergency Department (HOSPITAL_COMMUNITY): Payer: Medicare Other

## 2015-09-30 ENCOUNTER — Encounter (HOSPITAL_COMMUNITY): Payer: Self-pay

## 2015-09-30 DIAGNOSIS — R112 Nausea with vomiting, unspecified: Secondary | ICD-10-CM | POA: Diagnosis not present

## 2015-09-30 DIAGNOSIS — Z7982 Long term (current) use of aspirin: Secondary | ICD-10-CM | POA: Insufficient documentation

## 2015-09-30 DIAGNOSIS — Z87891 Personal history of nicotine dependence: Secondary | ICD-10-CM | POA: Diagnosis not present

## 2015-09-30 DIAGNOSIS — Z9081 Acquired absence of spleen: Secondary | ICD-10-CM | POA: Diagnosis not present

## 2015-09-30 DIAGNOSIS — Z8673 Personal history of transient ischemic attack (TIA), and cerebral infarction without residual deficits: Secondary | ICD-10-CM | POA: Insufficient documentation

## 2015-09-30 DIAGNOSIS — Z91048 Other nonmedicinal substance allergy status: Secondary | ICD-10-CM | POA: Insufficient documentation

## 2015-09-30 DIAGNOSIS — R519 Headache, unspecified: Secondary | ICD-10-CM

## 2015-09-30 DIAGNOSIS — I442 Atrioventricular block, complete: Secondary | ICD-10-CM | POA: Diagnosis present

## 2015-09-30 DIAGNOSIS — I10 Essential (primary) hypertension: Secondary | ICD-10-CM | POA: Diagnosis not present

## 2015-09-30 DIAGNOSIS — Z87442 Personal history of urinary calculi: Secondary | ICD-10-CM | POA: Insufficient documentation

## 2015-09-30 DIAGNOSIS — R55 Syncope and collapse: Secondary | ICD-10-CM | POA: Diagnosis not present

## 2015-09-30 DIAGNOSIS — I495 Sick sinus syndrome: Secondary | ICD-10-CM

## 2015-09-30 DIAGNOSIS — G8929 Other chronic pain: Secondary | ICD-10-CM | POA: Diagnosis not present

## 2015-09-30 DIAGNOSIS — R51 Headache: Secondary | ICD-10-CM

## 2015-09-30 DIAGNOSIS — I480 Paroxysmal atrial fibrillation: Secondary | ICD-10-CM | POA: Diagnosis not present

## 2015-09-30 DIAGNOSIS — Z91040 Latex allergy status: Secondary | ICD-10-CM | POA: Insufficient documentation

## 2015-09-30 DIAGNOSIS — R001 Bradycardia, unspecified: Secondary | ICD-10-CM | POA: Insufficient documentation

## 2015-09-30 DIAGNOSIS — K219 Gastro-esophageal reflux disease without esophagitis: Secondary | ICD-10-CM | POA: Diagnosis present

## 2015-09-30 DIAGNOSIS — J449 Chronic obstructive pulmonary disease, unspecified: Secondary | ICD-10-CM | POA: Diagnosis not present

## 2015-09-30 DIAGNOSIS — E785 Hyperlipidemia, unspecified: Secondary | ICD-10-CM | POA: Diagnosis not present

## 2015-09-30 DIAGNOSIS — E039 Hypothyroidism, unspecified: Secondary | ICD-10-CM | POA: Diagnosis not present

## 2015-09-30 DIAGNOSIS — I272 Other secondary pulmonary hypertension: Secondary | ICD-10-CM | POA: Diagnosis not present

## 2015-09-30 DIAGNOSIS — Z79890 Hormone replacement therapy (postmenopausal): Secondary | ICD-10-CM

## 2015-09-30 DIAGNOSIS — I700 Atherosclerosis of aorta: Secondary | ICD-10-CM | POA: Insufficient documentation

## 2015-09-30 DIAGNOSIS — Z853 Personal history of malignant neoplasm of breast: Secondary | ICD-10-CM | POA: Insufficient documentation

## 2015-09-30 DIAGNOSIS — E8940 Asymptomatic postprocedural ovarian failure: Secondary | ICD-10-CM | POA: Diagnosis present

## 2015-09-30 DIAGNOSIS — I639 Cerebral infarction, unspecified: Secondary | ICD-10-CM | POA: Diagnosis present

## 2015-09-30 HISTORY — DX: Calculus of kidney: N20.0

## 2015-09-30 HISTORY — DX: Hypothyroidism, unspecified: E03.9

## 2015-09-30 HISTORY — DX: Other chronic pain: G89.29

## 2015-09-30 HISTORY — DX: Unspecified atrial fibrillation: I48.91

## 2015-09-30 HISTORY — DX: Gastro-esophageal reflux disease without esophagitis: K21.9

## 2015-09-30 LAB — CBC
HEMATOCRIT: 41.7 % (ref 36.0–46.0)
Hemoglobin: 14.1 g/dL (ref 12.0–15.0)
MCH: 29.2 pg (ref 26.0–34.0)
MCHC: 33.8 g/dL (ref 30.0–36.0)
MCV: 86.3 fL (ref 78.0–100.0)
PLATELETS: 331 10*3/uL (ref 150–400)
RBC: 4.83 MIL/uL (ref 3.87–5.11)
RDW: 13.3 % (ref 11.5–15.5)
WBC: 8.6 10*3/uL (ref 4.0–10.5)

## 2015-09-30 LAB — I-STAT TROPONIN, ED: Troponin i, poc: 0 ng/mL (ref 0.00–0.08)

## 2015-09-30 LAB — BASIC METABOLIC PANEL
Anion gap: 6 (ref 5–15)
BUN: 12 mg/dL (ref 6–20)
CHLORIDE: 103 mmol/L (ref 101–111)
CO2: 28 mmol/L (ref 22–32)
CREATININE: 0.89 mg/dL (ref 0.44–1.00)
Calcium: 8.8 mg/dL — ABNORMAL LOW (ref 8.9–10.3)
Glucose, Bld: 100 mg/dL — ABNORMAL HIGH (ref 65–99)
POTASSIUM: 3.7 mmol/L (ref 3.5–5.1)
SODIUM: 137 mmol/L (ref 135–145)

## 2015-09-30 MED ORDER — ALBUTEROL SULFATE (2.5 MG/3ML) 0.083% IN NEBU
2.5000 mg | INHALATION_SOLUTION | Freq: Four times a day (QID) | RESPIRATORY_TRACT | Status: DC | PRN
Start: 2015-09-30 — End: 2015-10-03

## 2015-09-30 MED ORDER — ENOXAPARIN SODIUM 40 MG/0.4ML ~~LOC~~ SOLN
40.0000 mg | SUBCUTANEOUS | Status: DC
  Administered 2015-10-01 – 2015-10-03 (×3): 40 mg via SUBCUTANEOUS
  Filled 2015-09-30 (×4): qty 0.4

## 2015-09-30 MED ORDER — LISINOPRIL 20 MG PO TABS
20.0000 mg | ORAL_TABLET | Freq: Every day | ORAL | Status: DC
  Administered 2015-10-01 – 2015-10-02 (×2): 20 mg via ORAL
  Filled 2015-09-30 (×2): qty 1

## 2015-09-30 MED ORDER — ONDANSETRON HCL 4 MG PO TABS
4.0000 mg | ORAL_TABLET | Freq: Four times a day (QID) | ORAL | Status: DC | PRN
  Administered 2015-10-02: 4 mg via ORAL
  Filled 2015-09-30: qty 1

## 2015-09-30 MED ORDER — HYDROCODONE-ACETAMINOPHEN 10-325 MG PO TABS
1.0000 | ORAL_TABLET | Freq: Four times a day (QID) | ORAL | Status: DC | PRN
  Administered 2015-10-01 – 2015-10-03 (×8): 1 via ORAL
  Filled 2015-09-30 (×8): qty 1

## 2015-09-30 MED ORDER — PANTOPRAZOLE SODIUM 40 MG PO TBEC
40.0000 mg | DELAYED_RELEASE_TABLET | Freq: Every day | ORAL | Status: DC
  Administered 2015-10-01 – 2015-10-03 (×3): 40 mg via ORAL
  Filled 2015-09-30 (×3): qty 1

## 2015-09-30 MED ORDER — ACETAMINOPHEN 325 MG PO TABS
650.0000 mg | ORAL_TABLET | Freq: Once | ORAL | Status: AC
  Administered 2015-09-30: 650 mg via ORAL
  Filled 2015-09-30: qty 2

## 2015-09-30 MED ORDER — ESTRADIOL 1 MG PO TABS
1.0000 mg | ORAL_TABLET | Freq: Every day | ORAL | Status: DC
  Administered 2015-10-01 – 2015-10-03 (×3): 1 mg via ORAL
  Filled 2015-09-30 (×3): qty 1

## 2015-09-30 MED ORDER — LEVOTHYROXINE SODIUM 50 MCG PO TABS
50.0000 ug | ORAL_TABLET | Freq: Every day | ORAL | Status: DC
  Administered 2015-10-01 – 2015-10-03 (×3): 50 ug via ORAL
  Filled 2015-09-30 (×3): qty 1

## 2015-09-30 MED ORDER — ONDANSETRON HCL 4 MG/2ML IJ SOLN
4.0000 mg | Freq: Four times a day (QID) | INTRAMUSCULAR | Status: DC | PRN
  Administered 2015-10-02: 4 mg via INTRAVENOUS
  Filled 2015-09-30: qty 2

## 2015-09-30 MED ORDER — FENOFIBRATE 160 MG PO TABS
160.0000 mg | ORAL_TABLET | Freq: Every day | ORAL | Status: DC
  Administered 2015-10-01 – 2015-10-03 (×3): 160 mg via ORAL
  Filled 2015-09-30 (×3): qty 1

## 2015-09-30 MED ORDER — ASPIRIN EC 81 MG PO TBEC
81.0000 mg | DELAYED_RELEASE_TABLET | Freq: Every day | ORAL | Status: DC
  Administered 2015-10-01 – 2015-10-03 (×3): 81 mg via ORAL
  Filled 2015-09-30 (×3): qty 1

## 2015-09-30 MED ORDER — SODIUM CHLORIDE 0.9% FLUSH
3.0000 mL | Freq: Two times a day (BID) | INTRAVENOUS | Status: DC
  Administered 2015-10-02 – 2015-10-03 (×4): 3 mL via INTRAVENOUS

## 2015-09-30 MED ORDER — ZOLPIDEM TARTRATE 5 MG PO TABS: 5.0000 mg | ORAL_TABLET | Freq: Every evening | ORAL | Status: DC | PRN

## 2015-09-30 MED ORDER — SENNA 8.6 MG PO TABS
1.0000 | ORAL_TABLET | Freq: Two times a day (BID) | ORAL | Status: DC
  Administered 2015-10-01 – 2015-10-03 (×5): 8.6 mg via ORAL
  Filled 2015-09-30 (×5): qty 1

## 2015-09-30 MED ORDER — ACETAMINOPHEN 650 MG RE SUPP
650.0000 mg | Freq: Four times a day (QID) | RECTAL | Status: DC | PRN
Start: 2015-09-30 — End: 2015-10-03

## 2015-09-30 MED ORDER — HYDRALAZINE HCL 20 MG/ML IJ SOLN
10.0000 mg | INTRAMUSCULAR | Status: DC | PRN
  Administered 2015-10-02 (×3): 10 mg via INTRAVENOUS
  Filled 2015-09-30 (×3): qty 1

## 2015-09-30 MED ORDER — ATORVASTATIN CALCIUM 20 MG PO TABS
20.0000 mg | ORAL_TABLET | Freq: Every evening | ORAL | Status: DC
Start: 2015-10-01 — End: 2015-10-03
  Administered 2015-10-01 – 2015-10-03 (×3): 20 mg via ORAL
  Filled 2015-09-30 (×3): qty 1

## 2015-09-30 MED ORDER — CLORAZEPATE DIPOTASSIUM 3.75 MG PO TABS
7.5000 mg | ORAL_TABLET | Freq: Three times a day (TID) | ORAL | Status: DC
  Administered 2015-10-01 – 2015-10-03 (×9): 7.5 mg via ORAL
  Filled 2015-09-30 (×9): qty 2

## 2015-09-30 MED ORDER — ACETAMINOPHEN 325 MG PO TABS
650.0000 mg | ORAL_TABLET | Freq: Four times a day (QID) | ORAL | Status: DC | PRN
  Administered 2015-10-01 – 2015-10-03 (×2): 650 mg via ORAL
  Filled 2015-09-30 (×2): qty 2

## 2015-09-30 MED ORDER — DOCUSATE SODIUM 100 MG PO CAPS
100.0000 mg | ORAL_CAPSULE | Freq: Two times a day (BID) | ORAL | Status: DC
Start: 2015-09-30 — End: 2015-10-03
  Administered 2015-10-01 – 2015-10-03 (×5): 100 mg via ORAL
  Filled 2015-09-30 (×5): qty 1

## 2015-09-30 NOTE — ED Notes (Signed)
Notified Dr [name] that pt's heart rate dropped to 31 but that pt remained asymptomatic. Stayed at pt's bedside and heart rate was 42 and matched manual radial pulse I took. New orders given and carrried out accordingly.

## 2015-09-30 NOTE — ED Notes (Signed)
Pt ambulated to bathroom with need of assistance. Pt with unsteady gait and feeling of "dizziness". Dr. [name] notified.

## 2015-09-30 NOTE — ED Notes (Signed)
Pt complain of being dizzy earlier and felt like she was going to pass out. States she checked her heart rate and it was  41 and her blood pressure was 95/73.

## 2015-09-30 NOTE — H&P (Addendum)
History and Physical    [name] [id number] DOB: 27-Apr-1950 DOA: 09/30/2015  PCP: Robert Bellow, MD Consultants:  [facility] - orthopedics; [facility]; breast cancer center Patient coming from: home, lives with son; Catha Nottingham [id number]  Chief Complaint: weakness  HPI: [name] is a 65 y.o. female with medical history significant of CVA, afib, breast cancer presenting with pre-syncope.   Patient reports that her heart rate has been going down, feeling like she was going to pass out, things would go dark but she would sit down before she went completely out.  "Would feel like something would just drain out of my body."  Checked HR = 41, BP 90/70s.  Did have 1 prior asymptomatic period of HR in 40s while in the ER in the past.  Started over the weekend.  Called 911, but felt fine when they got there; when she got to ER, had symptoms again.     ED Course: Intermittent bradycardia; unremarkable EKG; neck xray ordered  Review of Systems: As per HPI; otherwise 10 point review of systems reviewed and negative.   Ambulatory Status: ambulatory without assistance  Past Medical History  Diagnosis Date  . Stroke [facility]) 2005    2 carotid artery stents in place  . Blood transfusion without reported diagnosis   . Cancer ([place]) 2016    partial mactectomies x 2  . Atrial fibrillation (HCC)     ASA daiy; came off Coumadin shortly after stroke  . Kidney stone   . Heart murmur   . Hyperlipidemia   . Hypertension   . Chronic pain   . GERD (gastroesophageal reflux disease)   . S/P splenectomy   . Hypothyroidism     Past Surgical History  Procedure Laterality Date  . Appendectomy    . Breast surgery    . Eye surgery    . Splenectomy, total      spontaneous rupture  . Abdominal hysterectomy    . Carotid stent Left     x2    Social History   Social History  . Marital Status: Divorced    Spouse Name: N/A  . Number of Children: N/A  . Years of Education: N/A    Occupational History  . retired    Social History Main Topics  . Smoking status: Former Research scientist (life sciences)  . Smokeless tobacco: Not on file     Comment: quit about 2007  . Alcohol Use: No  . Drug Use: No  . Sexual Activity: Not on file   Other Topics Concern  . Not on file   Social History Narrative  . No narrative on file    Allergies  Allergen Reactions  . Latex Rash  . Tape Rash    Family History  Problem Relation Age of Onset  . Cancer Sister   . Heart failure Brother     Prior to Admission medications   Medication Sig Start Date End Date Taking? Authorizing Provider  albuterol (PROVENTIL) (2.5 MG/3ML) 0.083% nebulizer solution Take 2.5 mg by nebulization every 6 (six) hours as needed for wheezing or shortness of breath.   Yes Historical Provider, MD  aspirin EC 81 MG tablet Take 81 mg by mouth daily.   Yes Historical Provider, MD  atorvastatin (LIPITOR) 20 MG tablet Take 20 mg by mouth every evening.  07/18/15  Yes Historical Provider, MD  cloNIDine (CATAPRES) 0.2 MG tablet Take 0.2 mg by mouth 2 (two) times daily. 07/22/15  Yes Historical Provider, MD  clorazepate (TRANXENE)  7.5 MG tablet Take 1 tablet by mouth 3 (three) times daily. 09/13/15  Yes Historical Provider, MD  diltiazem (CARDIZEM CD) 300 MG 24 hr capsule Take 300 mg by mouth daily. 08/19/15  Yes Historical Provider, MD  estradiol (ESTRACE) 1 MG tablet Take 1 mg by mouth daily. 09/20/15  Yes Historical Provider, MD  fenofibrate (TRICOR) 145 MG tablet Take 145 mg by mouth every evening.  08/19/15  Yes Historical Provider, MD  furosemide (LASIX) 40 MG tablet Take 40 mg by mouth daily as needed for fluid.  08/19/15  Yes Historical Provider, MD  HYDROcodone-acetaminophen (NORCO) 10-325 MG tablet Take 1 tablet by mouth every 6 (six) hours as needed for moderate pain or severe pain. For pain 09/20/15  Yes Historical Provider, MD  levothyroxine (SYNTHROID, LEVOTHROID) 50 MCG tablet Take 50 mcg by mouth daily. 08/19/15  Yes Historical  Provider, MD  lisinopril (PRINIVIL,ZESTRIL) 20 MG tablet Take 20 mg by mouth daily. 09/13/15  Yes Historical Provider, MD  nitroGLYCERIN (NITROSTAT) 0.4 MG SL tablet Place 0.4 mg under the tongue every 5 (five) minutes as needed for chest pain.  07/23/15  Yes Historical Provider, MD  pantoprazole (PROTONIX) 40 MG tablet Take 40 mg by mouth daily. 09/13/15  Yes Historical Provider, MD  potassium chloride SA (K-DUR,KLOR-CON) 20 MEQ tablet Take 20 mEq by mouth daily as needed (when Lasix 40mg  is taken).  08/19/15  Yes Historical Provider, MD  PROAIR HFA 108 (90 Base) MCG/ACT inhaler Inhale 1 puff into the lungs every 6 (six) hours as needed for wheezing or shortness of breath.  09/13/15  Yes Historical Provider, MD    Physical Exam: Filed Vitals:   09/30/15 1930 09/30/15 1945 09/30/15 2000 09/30/15 2016  BP: 150/72  133/76 133/76  Pulse: 56 57 52 60  Temp:      TempSrc:      Resp: 22   18  Height:      Weight:      SpO2: 95% 96% 95% 96%     General:  Appears calm and comfortable and is NAD Eyes:  PERRL, EOMI, normal lids, iris ENT:  grossly normal hearing, lips & tongue, mmm Neck:  no LAD, masses or thyromegaly Cardiovascular:  Sinus bradycardia, rate 50s-60s, no m/r/g. No LE edema.  Respiratory:  CTA bilaterally, no w/r/r. Normal respiratory effort. Abdomen:  soft, ntnd, NABS Skin:  no rash or induration seen on limited exam Musculoskeletal:  grossly normal tone BUE/BLE, good ROM, no bony abnormality Psychiatric:  grossly normal mood and affect, speech fluent and appropriate, AOx3 Neurologic:  CN 2-12 grossly intact, moves all extremities in coordinated fashion, sensation intact  Labs on Admission: I have personally reviewed following labs and imaging studies  CBC:  Recent Labs Lab 09/30/15 1700  WBC 8.6  HGB 14.1  HCT 41.7  MCV 86.3  PLT [id number]   Basic Metabolic Panel:  Recent Labs Lab 09/30/15 1700  NA 137  K 3.7  CL 103  CO2 28  GLUCOSE 100*  BUN 12  CREATININE 0.89    CALCIUM 8.8*   GFR: Estimated Creatinine Clearance: 61.1 mL/min (by C-G formula based on Cr of 0.89). Liver Function Tests: No results for input(s): AST, ALT, ALKPHOS, BILITOT, PROT, ALBUMIN in the last 168 hours. No results for input(s): LIPASE, AMYLASE in the last 168 hours. No results for input(s): AMMONIA in the last 168 hours. Coagulation Profile: No results for input(s): INR, PROTIME in the last 168 hours. Cardiac Enzymes: No results for input(s): CKTOTAL, CKMB,  CKMBINDEX, TROPONINI in the last 168 hours. BNP (last 3 results) No results for input(s): PROBNP in the last 8760 hours. HbA1C: No results for input(s): HGBA1C in the last 72 hours. CBG: No results for input(s): GLUCAP in the last 168 hours. Lipid Profile: No results for input(s): CHOL, HDL, LDLCALC, TRIG, CHOLHDL, LDLDIRECT in the last 72 hours. Thyroid Function Tests: No results for input(s): TSH, T4TOTAL, FREET4, T3FREE, THYROIDAB in the last 72 hours. Anemia Panel: No results for input(s): VITAMINB12, FOLATE, FERRITIN, TIBC, IRON, RETICCTPCT in the last 72 hours. Urine analysis: No results found for: COLORURINE, APPEARANCEUR, LABSPEC, PHURINE, GLUCOSEU, HGBUR, BILIRUBINUR, KETONESUR, PROTEINUR, UROBILINOGEN, NITRITE, LEUKOCYTESUR  Creatinine Clearance: Estimated Creatinine Clearance: 61.1 mL/min (by C-G formula based on Cr of 0.89).  Sepsis Labs: @LABRCNTIP (procalcitonin:4,lacticidven:4) )No results found for this or any previous visit (from the past 240 hour(s)).   Radiological Exams on Admission: Dg Chest 2 View  09/30/2015  CLINICAL DATA:  Syncope today.  Left arm and chest pain. EXAM: CHEST  2 VIEW COMPARISON:  None. FINDINGS: The lungs are clear. Heart size is normal. There is no pneumothorax or pleural effusion. Aortic atherosclerosis is noted. IMPRESSION: No acute disease. Atherosclerosis. Electronically Signed   By: Inge Rise M.D.   On: 09/30/2015 17:30    EKG: Independently reviewed.  NSR  with rate 57; no evidence of acute ischemia but bigeminy noted  Assessment/Plan Active Problems:   Symptomatic bradycardia   CVA (cerebral infarction)   S/P splenectomy   Essential hypertension   Chronic pain   Hyperlipidemia   Surgical menopause on hormone replacement therapy   GERD (gastroesophageal reflux disease)   Paroxysmal atrial fibrillation (HCC)   Hypothyroidism    Symptomatic bradycardia -Patient presenting with pre-syncope thought to be related to bradycardia -While this may be related to her Cardizem (300 mg long-acting daily), she last took this medication on Friday AM due to concern that it may be contributing to her symptoms; the half-life of this medication is only 5-8 hours and so should not be in her system any longer -Will place in observation status on telemetry for overnight monitoring -If she is continuing to have bradycardia within the next 24 hours, would suggest cardiology consult as patient likely needs a pacemaker for sick sinus syndrome  Paroxysmal afib with h/o CVA -Only takes 81 mg ASA for anticoagulation -Her CHA2DS2-VASc score is 6, indicating an adjusted stroke risk of 9.8% per year (HTN, vascular disease - h/o 2 carotid stents, h/o CVA, female, age 27). -She is not currently in afib but assuming that this did not resolve completely - which it rarely does - then she likely needs full anticoagulation -Unrelated to current complaint but should be addressed at time of discharge or shortly thereafter as an outpatient  HTN -Stopping Cardizem for now -If she goes into afib with RVR, this may be problematic -Continue Clonidine and Lisinopril for now  Chronic pain -I have reviewed this patient in the [place] Controlled Substances Reporting System.  She is receiving her medications from only one provider and appears to be taking them as prescribed. -C-spine imaging shows significant disease; may benefit from outpatient referral to  neurosurgery  Hypothyroidism -Will check TSH to ensure that this is not contributing to bradycardia -Continue home dose of Synthroid for now  Surgical menopause -Patient is taking Estradiol daily -Given her h/o CVA - particularly while not on anticoagulation other than ASA 81 mg daily - this is suboptimal -Assuming this medication is for symptom control, there are  other available medications that may control her symptoms while lessening her CVD risk from estrogen -Suggest consideration as an outpatient  HLD -Continue Lipitor  GERD -Continue Protonix   DVT prophylaxis:  Lovenox Code Status: Full - confirmed with patient Family Communication: Not present at the time of admission Disposition Plan: Home once clinically improved Consults called: None but may require cardiology Admission status: Observation - telemetry   Karmen Bongo MD [facility]  If 7PM-7AM, please contact night-coverage [email or web address] Password [email or web address]  09/30/2015, 8:44 PM    Addendum: Patient went into a bradycardia rhythm that was concerning for complete heart block.  It was captured on EKG.  Dr. [name] called cardiology at [facility] and they prefer to accept the patient onto their service.  As such, she will no longer be admitted to the [facility] service, although of course we are happy to consult on her case should the need arise.  Carlyon Shadow, M.D.

## 2015-09-30 NOTE — ED Provider Notes (Signed)
CSN: [id number]     Arrival date & time 09/30/15  1626 History   First MD Initiated Contact with Patient 09/30/15 1629     Chief Complaint  Patient presents with  . Chest Pain     (Consider location/radiation/quality/duration/timing/severity/associated sxs/prior Treatment) HPI Comments: Patient presents from home with a three-day history of generalized weakness, near syncope and fatigue. Patient states that she feels like she was going to fall over and lose consciousness but has not actually lost consciousness or passed out. Denies any diaphoresis, nausea, vomiting, chest pain or shortness of breath. Today she has had constant pain in her left arm since she woke up. She's had this pain before when she had a stroke several years ago. Denies any new weakness, numbness or tingling. Has persistent right-sided numbness and tingling in her right leg from previous stroke that is unchanged. She states she's been eating and drinking well. She denies any vomiting or diarrhea. No recent medication changes. She checked her heart rate at home and says it was in the 40s and she felt dizzy and lightheaded and had a sit down. Denies any chest pain contrary to triage note.  The history is provided by the patient.    Past Medical History  Diagnosis Date  . Stroke ([place])   . Blood transfusion without reported diagnosis   . Cancer ([place])   . Atrial fibrillation ([place])   . Kidney stone   . Heart murmur    Past Surgical History  Procedure Laterality Date  . Appendectomy    . Breast surgery    . Eye surgery    . Splenectomy, total    . Abdominal hysterectomy    . Carotid stent     No family history on file. Social History  Substance Use Topics  . Smoking status: Former Research scientist (life sciences)  . Smokeless tobacco: None  . Alcohol Use: No   OB History    No data available     Review of Systems  Constitutional: Negative for fever, activity change, appetite change and fatigue.  HENT: Negative for congestion and  postnasal drip.   Eyes: Negative for visual disturbance.  Respiratory: Positive for chest tightness. Negative for cough and shortness of breath.   Cardiovascular: Positive for chest pain and palpitations.  Gastrointestinal: Negative for nausea, vomiting and abdominal pain.  Endocrine: Negative for polydipsia, polyphagia and polyuria.  Genitourinary: Negative for dysuria, hematuria, vaginal bleeding and vaginal discharge.  Musculoskeletal: Negative for myalgias and arthralgias.  Neurological: Positive for dizziness and light-headedness. Negative for weakness, numbness and headaches.  A complete 10 system review of systems was obtained and all systems are negative except as noted in the HPI and PMH.      Allergies  Review of patient's allergies indicates no known allergies.  Home Medications   Prior to Admission medications   Medication Sig Start Date End Date Taking? Authorizing Provider  atorvastatin (LIPITOR) 20 MG tablet Take 20 mg by mouth daily. 07/18/15   Historical Provider, MD  cloNIDine (CATAPRES) 0.2 MG tablet Take 0.2 mg by mouth 2 (two) times daily. 07/22/15   Historical Provider, MD  clorazepate (TRANXENE) 7.5 MG tablet Take 1 tablet by mouth 3 (three) times daily. 09/13/15   Historical Provider, MD  diltiazem (CARDIZEM CD) 300 MG 24 hr capsule Take 300 mg by mouth daily. 08/19/15   Historical Provider, MD  estradiol (ESTRACE) 1 MG tablet Take 1 mg by mouth daily. 09/20/15   Historical Provider, MD  fenofibrate (TRICOR) 145 MG tablet  Take 145 mg by mouth daily. 08/19/15   Historical Provider, MD  furosemide (LASIX) 40 MG tablet Take 40 mg by mouth daily. 08/19/15   Historical Provider, MD  HYDROcodone-acetaminophen (NORCO) 10-325 MG tablet Take 1 tablet by mouth every 6 (six) hours as needed. For pain 09/20/15   Historical Provider, MD  levothyroxine (SYNTHROID, LEVOTHROID) 50 MCG tablet Take 50 mcg by mouth daily. 08/19/15   Historical Provider, MD  lisinopril (PRINIVIL,ZESTRIL) 20 MG  tablet Take 20 mg by mouth daily. 09/13/15   Historical Provider, MD  nitroGLYCERIN (NITROSTAT) 0.4 MG SL tablet  07/23/15   Historical Provider, MD  pantoprazole (PROTONIX) 40 MG tablet Take 40 mg by mouth daily. 09/13/15   Historical Provider, MD  potassium chloride SA (K-DUR,KLOR-CON) 20 MEQ tablet Take 20 mEq by mouth daily. 08/19/15   Historical Provider, MD  PROAIR HFA 108 (90 Base) MCG/ACT inhaler Inhale 1 puff into the lungs every 6 (six) hours as needed. 09/13/15   Historical Provider, MD   BP 144/93 mmHg  Pulse 62  Temp(Src) 98.7 F (37.1 C) (Oral)  Resp 18  Ht 5\' 3"  (1.6 m)  Wt 165 lb (74.844 kg)  BMI 29.24 kg/m2  SpO2 96% Physical Exam  Constitutional: She is oriented to person, place, and time. She appears well-developed and well-nourished. No distress.  HENT:  Head: Normocephalic and atraumatic.  Mouth/Throat: Oropharynx is clear and moist. No oropharyngeal exudate.  Eyes: Conjunctivae and EOM are normal. Pupils are equal, round, and reactive to light.  Neck: Normal range of motion. Neck supple.  No meningismus.  Cardiovascular: Normal rate, normal heart sounds and intact distal pulses.   No murmur heard. Irregular bradycardia  Pulmonary/Chest: Effort normal and breath sounds normal. No respiratory distress.  Abdominal: Soft. There is no tenderness. There is no rebound and no guarding.  Musculoskeletal: Normal range of motion. She exhibits tenderness. She exhibits no edema.  Tenderness to palpation of L upper arm  Neurological: She is alert and oriented to person, place, and time. No cranial nerve deficit. She exhibits normal muscle tone. Coordination normal.  No ataxia on finger to nose bilaterally. No pronator drift. 5/5 strength throughout. CN 2-12 intact.Equal grip strength. Sensation intact.   Skin: Skin is warm.  Psychiatric: She has a normal mood and affect. Her behavior is normal.  Nursing note and vitals reviewed.   ED Course  Procedures (including critical care  time) Labs Review Labs Reviewed  BASIC METABOLIC PANEL - Abnormal; Notable for the following:    Glucose, Bld 100 (*)    Calcium 8.8 (*)    All other components within normal limits  CBC  I-STAT TROPOININ, ED    Imaging Review Dg Chest 2 View  09/30/2015  CLINICAL DATA:  Syncope today.  Left arm and chest pain. EXAM: CHEST  2 VIEW COMPARISON:  None. FINDINGS: The lungs are clear. Heart size is normal. There is no pneumothorax or pleural effusion. Aortic atherosclerosis is noted. IMPRESSION: No acute disease. Atherosclerosis. Electronically Signed   By: Inge Rise M.D.   On: 09/30/2015 17:30   Dg Cervical Spine Complete  09/30/2015  CLINICAL DATA:  Left arm pain and paresthesias. EXAM: CERVICAL SPINE - COMPLETE 4+ VIEW COMPARISON:  None available FINDINGS: Bones are osteopenic. Advanced spondylosis and degenerative disc disease at C5-6 with disc space loss, sclerosis and large osteophytes mild degenerative changes at other levels. Mild diffuse facet arthropathy. Facets appear aligned. Foraminal encroachment suspected of the left C5-6 foramen related to bony spurring. Trachea  is midline. Odontoid is intact. 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 No acute osseous finding by plain radiography. Electronically Signed   By: Jerilynn Mages.  Shick M.D.   On: 09/30/2015 20:51   I have personally reviewed and evaluated these images and lab results as part of my medical decision-making.   EKG Interpretation   Date/Time:  Monday September 30 2015 16:30:28 EDT Ventricular Rate:  60 PR Interval:    QRS Duration: 98 QT Interval:  449 QTC Calculation: 449 R Axis:   53 Text Interpretation:  Sinus rhythm Consider anterior infarct No previous  ECGs available Confirmed by Myley Bahner  MD, Tai Skelly [id number]) on 09/30/2015  5:04:30 PM      MDM   Final diagnoses:  Symptomatic bradycardia  Complete heart block ([place])   Patient has by EMS  with near syncope and low heart rate. No chest pain or shortness of breath but does have ongoing left arm pain that she's had previously.  Initial EKG is sinus bradycardia at 60. Labs unremarkable. Troponin negative.  Her CHA2DS2-VASc score is 6, indicating an adjusted stroke risk of 9.8% per year (HTN, vascular disease - h/o 2 carotid stents, h/o CVA, female, age 40). She is not on anticoagulation.  While in the emergency Department patient was symptomatic from her bradycardia with dizziness and lightheadedness. Subsequent EKG showed bigeminy and third EKG did show what appears to be complete heart block. This was transient and patient quickly came back into sinus bradycardia. Pacer pads were applied the patient. Patient is on Cardizem but no other AV nodal blockers.  Admission had been planned to the hospitalist service until complete heart block was noted. Discussed with cardiology Dr. Susy Manor who accepts patient to [facility]. Dr. Lorin Mercy updated. Blood pressure and mental status remained stable in the ED.   CRITICAL CARE Performed by: Ezequiel Essex Total critical care time: 35 minutes Critical care time was exclusive of separately billable procedures and treating other patients. Critical care was necessary to treat or prevent imminent or life-threatening deterioration. Critical care was time spent personally by me on the following activities: development of treatment plan with patient and/or surrogate as well as nursing, discussions with consultants, evaluation of patient's response to treatment, examination of patient, obtaining history from patient or surrogate, ordering and performing treatments and interventions, ordering and review of laboratory studies, ordering and review of radiographic studies, pulse oximetry and re-evaluation of patient's condition.   Ezequiel Essex, MD 09/30/15 2224

## 2015-09-30 NOTE — H&P (Signed)
CC: symptomatic bradycardia  HPI:  65 yo [place] woman with hx of HTN, HLD, CVA in 2002 (reportedly had stenting x2 in the head), recorded history of AF (but patient does not recall this), presented to [facility] ER with c/p slow heart beat associated with dizziness, generalized weakness, low BP and presyncope. Has been having these episodes for the past 3 days and in fact she held her Dilt 300 mg po qd x 3 days. Denies CP, orthopnea, PND, syncope, palpitations. She reports that her HR was as low as in 30's at home. Denies fever, N/V, bleeding.  In the ER, initial ECg showed SR 60 bpm. But subsequent ECGs had evidence of sinus pauses and intermittent CHB.   Reports having a chemical stress test by her PCP last year and was reportedly unremarkable. I don't have report available at this time    Review of Systems:  10 systems reviewed unremarkable except as noted in HPI   Past Medical History  Diagnosis Date  . Stroke [facility]) 2005    2 carotid artery stents in place  . Blood transfusion without reported diagnosis   . Cancer ([place]) 2016    partial mactectomies x 2  . Atrial fibrillation ([facility])     ASA daiy; came off Coumadin shortly after stroke  . Kidney stone   . Heart murmur   . Hyperlipidemia   . Hypertension   . Chronic pain   . GERD (gastroesophageal reflux disease)   . S/P splenectomy   . Hypothyroidism     No current facility-administered medications on file prior to encounter.   No current outpatient prescriptions on file prior to encounter.   Home Meds:  Reviewed   Allergies  Allergen Reactions  . Latex Rash  . Tape Rash    Social History   Social History  . Marital Status: Divorced    Spouse Name: N/A  . Number of Children: N/A  . Years of Education: N/A   Occupational History  . retired    Social History Main Topics  . Smoking status: Former Research scientist (life sciences)  . Smokeless tobacco: Not on file     Comment: quit about 2007  . Alcohol Use: No  . Drug Use: No  .  Sexual Activity: Not on file   Other Topics Concern  . Not on file   Social History Narrative  . No narrative on file    Family History  Problem Relation Age of Onset  . Cancer Sister   . Heart failure Brother     PHYSICAL EXAM: Filed Vitals:   09/30/15 2130 09/30/15 2246  BP: 185/92 176/75  Pulse: 71 63  Temp:  99.1 F (37.3 C)  Resp: 23 20   General:  Well appearing. No respiratory difficulty HEENT: normal Neck: supple. no JVD. Carotids 2+ bilat; no bruits. No lymphadenopathy or thryomegaly appreciated. Cor: PMI nondisplaced. Regular rate & rhythm. No rubs, gallops or murmurs. Lungs: clear Abdomen: soft, nontender, nondistended. No hepatosplenomegaly. No bruits or masses. Good bowel sounds. Extremities: no cyanosis, clubbing, rash, edema Neuro: alert & oriented x 3, cranial nerves grossly intact. moves all 4 extremities w/o difficulty. Affect pleasant.    Results for orders placed or performed during the hospital encounter of 09/30/15 (from the past 24 hour(s))  Basic metabolic panel     Status: Abnormal   Collection Time: 09/30/15  5:00 PM  Result Value Ref Range   Sodium 137 135 - 145 mmol/L   Potassium 3.7 3.5 - 5.1 mmol/L  Chloride 103 101 - 111 mmol/L   CO2 28 22 - 32 mmol/L   Glucose, Bld 100 (H) 65 - 99 mg/dL   BUN 12 6 - 20 mg/dL   Creatinine, Ser 0.89 0.44 - 1.00 mg/dL   Calcium 8.8 (L) 8.9 - 10.3 mg/dL   GFR calc non Af Amer >60 >60 mL/min   GFR calc Af Amer >60 >60 mL/min   Anion gap 6 5 - 15  CBC     Status: None   Collection Time: 09/30/15  5:00 PM  Result Value Ref Range   WBC 8.6 4.0 - 10.5 K/uL   RBC 4.83 3.87 - 5.11 MIL/uL   Hemoglobin 14.1 12.0 - 15.0 g/dL   HCT 41.7 36.0 - 46.0 %   MCV 86.3 78.0 - 100.0 fL   MCH 29.2 26.0 - 34.0 pg   MCHC 33.8 30.0 - 36.0 g/dL   RDW 13.3 11.5 - 15.5 %   Platelets 331 150 - 400 K/uL  I-stat troponin, ED     Status: None   Collection Time: 09/30/15  5:15 PM  Result Value Ref Range   Troponin i, poc  0.00 0.00 - 0.08 ng/mL   Comment 3           Dg Chest 2 View  09/30/2015  CLINICAL DATA:  Syncope today.  Left arm and chest pain. EXAM: CHEST  2 VIEW COMPARISON:  None. FINDINGS: The lungs are clear. Heart size is normal. There is no pneumothorax or pleural effusion. Aortic atherosclerosis is noted. IMPRESSION: No acute disease. Atherosclerosis. Electronically Signed   By: Inge Rise M.D.   On: 09/30/2015 17:30   Dg Cervical Spine Complete  09/30/2015  CLINICAL DATA:  Left arm pain and paresthesias. EXAM: CERVICAL SPINE - COMPLETE 4+ VIEW COMPARISON:  None available FINDINGS: Bones are osteopenic. Advanced spondylosis and degenerative disc disease at C5-6 with disc space loss, sclerosis and large osteophytes mild degenerative changes at other levels. Mild diffuse facet arthropathy. Facets appear aligned. Foraminal encroachment suspected of the left C5-6 foramen related to bony spurring. Trachea is midline. Odontoid is intact. 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 No acute osseous finding by plain radiography. Electronically Signed   By: Jerilynn Mages.  Shick M.D.   On: 09/30/2015 20:51     ASSESSMENT:  1. Symptomatic bradycardia due to sick sinus syndrome as well as intermittent high grade AV block  - was on Dilt 300 mg po qd (held for the past 3 days) and Clonidine 0.2 mg po bid   2. HTN   PLAN/DISCUSSION:   Patient has been transferred from [facility] [place] [facility]; admit to cardiology  Echo, serial Trop Will try to obtain stress test result Hold Dilt and Clonidine  Continue thyroid replacement Check TSH Pacer pads on  Will ask EP to see for DDD pacemaker. Discussed with patient.   DVT ppx  Please see orders for other details   Wandra Mannan, MD Cardiology

## 2015-10-01 ENCOUNTER — Inpatient Hospital Stay (HOSPITAL_COMMUNITY): Payer: Medicare Other

## 2015-10-01 DIAGNOSIS — I10 Essential (primary) hypertension: Secondary | ICD-10-CM | POA: Diagnosis not present

## 2015-10-01 DIAGNOSIS — R001 Bradycardia, unspecified: Secondary | ICD-10-CM

## 2015-10-01 DIAGNOSIS — I495 Sick sinus syndrome: Secondary | ICD-10-CM | POA: Diagnosis not present

## 2015-10-01 DIAGNOSIS — R55 Syncope and collapse: Secondary | ICD-10-CM | POA: Diagnosis not present

## 2015-10-01 LAB — TSH: TSH: 4.139 u[IU]/mL (ref 0.350–4.500)

## 2015-10-01 LAB — TROPONIN I
Troponin I: <0.03 ng/mL (ref <0.03)
Troponin I: <0.03 ng/mL (ref <0.03)

## 2015-10-01 LAB — ECHOCARDIOGRAM COMPLETE
Height: 63 in
Weight: 3083.2 oz

## 2015-10-01 NOTE — Progress Notes (Signed)
Echocardiogram 2D Echocardiogram has been performed.  [name] 10/01/2015, 1:20 PM

## 2015-10-01 NOTE — Consult Note (Signed)
ELECTROPHYSIOLOGY CONSULT NOTE    Patient ID: [name] MRN: [id number], DOB/AGE: 66-Sep-1952 66 y.o.  Admit date: 09/30/2015 Date of Consult: 10/01/2015  Primary Physician: Milana Obey, MD Primary Cardiologist: Dr. [name], (2015 for pre-op clearance) Requesting MD: Dr. Karolee Ohs  Reason for Consultation: bradycardia  HPI: [name] is a 65 y.o. female with PMHx of CVA with PVD/L carotid, stents x2 in 2005, this was in the environment of dissection/aneurysm , breast Ca treated surgically, spleenectomy, hypothyroidism, HLD, HTN, there is mention of AF though the patient is a little unsure of this diagnosis.  About a year ago at an ER visit she recalls being told her HR was slow and probably her diltiazem, and thinks they might have mentioned AFib then as well. No new rx were written (appears she left the ER AMA).  She comes to the hospital c/o sudden onset dizzy spells, start Thursday, she checked her BP noting it ok, but her HR in the 40's so she stopped her diltiazem, the episodes continued though where she would suddenly feel like things were going black and has been able so far to sit and they pass without full syncope.  She has not had palpitations, no CP or SOB.  She called 911 and was brought to [facility] where she was observed to episodes of bradycardia and suspect CHB.  LABS: K+ 3/7 BUN/Creat 12/0.89 poc Trop 0.00 Trop I: <0.03 x2 H/H 14/41 WBC 8.6 TSH 4.139  Home meds include Diltiazem CD  daily, last dose was Thursday 09/26/15, she was also on clonidine she thinks her last dose was yesterday morning.  NOTE: the patient has another record in Epic, MR [id number], the patient confirms this to be her personal data and correct information,  She was seen in 2015 by Dr. [name] for preoperative clearance at that time having lexiscan stress test that was normal.no ischemia, EF 79%  Past Medical History  Diagnosis Date  . Stroke [facility]) 2005    2 carotid artery stents  in place  . Blood transfusion without reported diagnosis   . Cancer (HCC) 2016    partial mactectomies x 2  . Atrial fibrillation (HCC)     ASA daiy; came off Coumadin shortly after stroke  . Kidney stone   . Heart murmur   . Hyperlipidemia   . Hypertension   . Chronic pain   . GERD (gastroesophageal reflux disease)   . S/P splenectomy   . Hypothyroidism      Surgical History:  Past Surgical History  Procedure Laterality Date  . Appendectomy    . Breast surgery    . Eye surgery    . Splenectomy, total      spontaneous rupture  . Abdominal hysterectomy    . Carotid stent Left     x2     Prescriptions prior to admission  Medication Sig Dispense Refill Last Dose  . albuterol (PROVENTIL) (2.5 MG/3ML) 0.083% nebulizer solution Take 2.5 mg by nebulization every 6 (six) hours as needed for wheezing or shortness of breath.   Past Month at Unknown time  . aspirin EC 81 MG tablet Take 81 mg by mouth daily.   09/30/2015 at Unknown time  . atorvastatin (LIPITOR) 20 MG tablet Take 20 mg by mouth every evening.    09/29/2015 at Unknown time  . cloNIDine (CATAPRES) 0.2 MG tablet Take 0.2 mg by mouth 2 (two) times daily.   09/30/2015 at Unknown time  . clorazepate (TRANXENE) 7.5 MG tablet Take 1 tablet  by mouth 3 (three) times daily.   09/30/2015 at Unknown time  . diltiazem (CARDIZEM CD) 300 MG 24 hr capsule Take 300 mg by mouth daily.   09/30/2015 at Unknown time  . estradiol (ESTRACE) 1 MG tablet Take 1 mg by mouth daily.   09/30/2015 at Unknown time  . fenofibrate (TRICOR) 145 MG tablet Take 145 mg by mouth every evening.    09/29/2015 at Unknown time  . furosemide (LASIX) 40 MG tablet Take 40 mg by mouth daily as needed for fluid.    Past Month at Unknown time  . HYDROcodone-acetaminophen (NORCO) 10-325 MG tablet Take 1 tablet by mouth every 6 (six) hours as needed for moderate pain or severe pain. For pain   Past Month at Unknown time  . levothyroxine (SYNTHROID, LEVOTHROID) 50 MCG tablet  Take 50 mcg by mouth daily.   09/30/2015 at Unknown time  . lisinopril (PRINIVIL,ZESTRIL) 20 MG tablet Take 20 mg by mouth daily.   09/30/2015 at Unknown time  . nitroGLYCERIN (NITROSTAT) 0.4 MG SL tablet Place 0.4 mg under the tongue every 5 (five) minutes as needed for chest pain.    Past Month at Unknown time  . pantoprazole (PROTONIX) 40 MG tablet Take 40 mg by mouth daily.   09/30/2015 at Unknown time  . potassium chloride SA (K-DUR,KLOR-CON) 20 MEQ tablet Take 20 mEq by mouth daily as needed (when Lasix 40mg  is taken).    Past Month at Unknown time  . PROAIR HFA 108 (90 Base) MCG/ACT inhaler Inhale 1 puff into the lungs every 6 (six) hours as needed for wheezing or shortness of breath.    Past Month at Unknown time    Inpatient Medications:  . aspirin EC  81 mg Oral Daily  . atorvastatin  20 mg Oral QPM  . clorazepate  7.5 mg Oral TID  . docusate sodium  100 mg Oral BID  . enoxaparin (LOVENOX) injection  40 mg Subcutaneous Q24H  . estradiol  1 mg Oral Daily  . fenofibrate  160 mg Oral Daily  . levothyroxine  50 mcg Oral QAC breakfast  . lisinopril  20 mg Oral Daily  . pantoprazole  40 mg Oral Daily  . senna  1 tablet Oral BID  . sodium chloride flush  3 mL Intravenous Q12H    Allergies:  Allergies  Allergen Reactions  . Latex Rash  . Tape Rash    Social History   Social History  . Marital Status: Divorced    Spouse Name: N/A  . Number of Children: N/A  . Years of Education: N/A   Occupational History  . retired    Social History Main Topics  . Smoking status: Former Research scientist (life sciences)  . Smokeless tobacco: Not on file     Comment: quit about 2007  . Alcohol Use: No  . Drug Use: No  . Sexual Activity: Not on file   Other Topics Concern  . Not on file   Social History Narrative  . No narrative on file     Family History  Problem Relation Age of Onset  . Cancer Sister   . Heart failure Brother      Review of Systems: All other systems reviewed and are otherwise  negative except as noted above.  Physical Exam: Filed Vitals:   09/30/15 2130 09/30/15 2246 10/01/15 0418 10/01/15 0805  BP: 185/92 176/75 125/91 172/91  Pulse: 71 63 65 51  Temp:  99.1 F (37.3 C) 98.7 F (37.1 C) 98.6 F (  37 C)  TempSrc:  Oral Oral Oral  Resp: 23 20 18 18   Height:  5\' 3"  (1.6 m)    Weight:  191 lb 12.8 oz (87 kg) 192 lb 11.2 oz (87.408 kg)   SpO2: 99% 97% 96%     GEN- The patient is well appearing, alert and oriented x 3 today, she becomes tearful reporting significant family stress and recent death of a sibling HEENT: normocephalic, atraumatic; sclera clear, conjunctiva pink; hearing intact; oropharynx clear; neck supple, no JVP Lymph- no cervical lymphadenopathy Lungs- Clear to ausculation bilaterally, normal work of breathing.  No wheezes, rales, rhonchi Heart- Regular rate and rhythm, no significant murmurs, rubs or gallops GI- soft, non-tender, non-distended Extremities- no clubbing, cyanosis, or edema MS- no significant deformity or atrophy Skin- warm and dry, no rash or lesion Psych- euthymic mood, full affect Neuro- no gross deficits observed  Labs:   Lab Results  Component Value Date   WBC 8.6 09/30/2015   HGB 14.1 09/30/2015   HCT 41.7 09/30/2015   MCV 86.3 09/30/2015   PLT 331 09/30/2015    Recent Labs Lab 09/30/15 1700  NA 137  K 3.7  CL 103  CO2 28  BUN 12  CREATININE 0.89  CALCIUM 8.8*  GLUCOSE 100*      Radiology/Studies:  Dg Chest 2 View 09/30/2015  CLINICAL DATA:  Syncope today.  Left arm and chest pain. EXAM: CHEST  2 VIEW COMPARISON:  None. FINDINGS: The lungs are clear. Heart size is normal. There is no pneumothorax or pleural effusion. Aortic atherosclerosis is noted. IMPRESSION: No acute disease. Atherosclerosis. Electronically Signed   By: Inge Rise M.D.   On: 09/30/2015 17:30   Dg Cervical Spine Complete 09/30/2015  CLINICAL DATA:  Left arm pain and paresthesias. EXAM: CERVICAL SPINE - COMPLETE 4+ VIEW  COMPARISON:  None available FINDINGS: Bones are osteopenic. Advanced spondylosis and degenerative disc disease at C5-6 with disc space loss, sclerosis and large osteophytes mild degenerative changes at other levels. Mild diffuse facet arthropathy. Facets appear aligned. Foraminal encroachment suspected of the left C5-6 foramen related to bony spurring. Trachea is midline. Odontoid is intact. 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 No acute osseous finding by plain radiography. Electronically Signed   By: Jerilynn Mages.  Shick M.D.   On: 09/30/2015 20:51    EKG:  #1 SR, borderline 1st degree AVBlock #2 SB, APCs 57bpm #3 looks junctional rhythm, sinus beats as well, 47bpm, narrow QRS #4 appears similar to #3,   Reviewed EKGs from her ER visit last year in Aug when she was told her HR was slow, ? AF, bradycardic, junctional 40's, no AF noted  TELEMETRY: SB/SR, HR overnight 30's-60's, otherwise 60'70's, noted sinus pauses as long as 5 seconds, more overnight and longer pauses overnight, shorter pauses during daytime, and at times junctional beats 40's-50's transiently  Echocardiogram is pending  05/30/13: Lexiscan stress test Impression Exercise Capacity: Lexiscan with no exercise. BP Response: Normal blood pressure response. Clinical Symptoms: No significant symptoms noted. ECG Impression: No significant ST segment change suggestive of ischemia. Comparison with Prior Nuclear Study: No images to compare Overall Impression: Normal stress nuclear study. No evidence of ischemia.  LV Ejection Fraction: 79%. LV Wall Motion: NL LV Function; NL Wall Motion.  Assessment and Plan:  1.  Near syncope      Sinus pauses and bradycardia      Off cardizem for 4 days, on clonidine until here,  last dose yesterday AM, 1/2 life is 12-16hours      disussed the possibility of PPM, though she is very reluctant      Echo is pending  2.  HTN     Follow  3. Question of AF hx     This is unclear, no AF noted here, will review her historical EKGs further     CHA2DS2Vasc is 2 (Her CVA was in the context of a carotid dissection/aneurysm)     Continue telemetry   Signed, Tommye Standard, PA-C 10/01/2015 8:36 AM  EP Attending  Patient seen and examined. Agree with the findings as noted above. The patient has some sinus node dysfunction in the setting of HTN. She has presented with symptomatic bradycardia on Clonidine. Her clonidine has been stopped. Her bradycardia has already improved. She has not had frank syncope. I do not see any evidence of atrial fib. Exam reveals a RRR and clear lungs and no edema and normal abdominal exam. A/P 1. Sinus node dysfunction - I suspect her rates will be well controlled once clonidine is out of her system. She will need blood pressure control with medications that do not slow the sinus node or AV node. 2. Near syncope - a consequence of #1. Should resolve off of the above meds. 3. HTN - she will need an ARB, and possible a diuretic and either amlodipine or procardia xl.  Mikle [name].D.

## 2015-10-02 DIAGNOSIS — I10 Essential (primary) hypertension: Secondary | ICD-10-CM | POA: Diagnosis not present

## 2015-10-02 DIAGNOSIS — I495 Sick sinus syndrome: Secondary | ICD-10-CM | POA: Diagnosis not present

## 2015-10-02 DIAGNOSIS — R55 Syncope and collapse: Secondary | ICD-10-CM | POA: Diagnosis not present

## 2015-10-02 MED ORDER — CARVEDILOL 3.125 MG PO TABS
3.1250 mg | ORAL_TABLET | Freq: Two times a day (BID) | ORAL | Status: DC
Start: 2015-10-02 — End: 2015-10-03
  Administered 2015-10-02 – 2015-10-03 (×3): 3.125 mg via ORAL
  Filled 2015-10-02 (×3): qty 1

## 2015-10-02 MED ORDER — LISINOPRIL 20 MG PO TABS
20.0000 mg | ORAL_TABLET | Freq: Once | ORAL | Status: AC
  Administered 2015-10-02: 20 mg via ORAL
  Filled 2015-10-02: qty 1

## 2015-10-02 MED ORDER — LISINOPRIL 40 MG PO TABS: 40.0000 mg | ORAL_TABLET | Freq: Every day | ORAL | Status: DC

## 2015-10-02 MED ORDER — LISINOPRIL 40 MG PO TABS
40.0000 mg | ORAL_TABLET | Freq: Every day | ORAL | Status: DC
Start: 2015-10-03 — End: 2015-10-03
  Administered 2015-10-03: 40 mg via ORAL
  Filled 2015-10-02: qty 1

## 2015-10-02 MED ORDER — ONDANSETRON HCL 40 MG/20ML IJ SOLN
8.0000 mg | Freq: Once | INTRAMUSCULAR | Status: AC
  Administered 2015-10-02: 8 mg via INTRAVENOUS
  Filled 2015-10-02: qty 4

## 2015-10-02 NOTE — Progress Notes (Signed)
SUBJECTIVE: The patient is doing well today.  At this time, she denies chest pain, shortness of breath, or any new concerns.  She has been ambulatory in her room without difficulty, no dizzy spells, no near syncope or syncope.   Marland Kitchen aspirin EC  81 mg Oral Daily  . atorvastatin  20 mg Oral QPM  . carvedilol  3.125 mg Oral BID WC  . clorazepate  7.5 mg Oral TID  . docusate sodium  100 mg Oral BID  . enoxaparin (LOVENOX) injection  40 mg Subcutaneous Q24H  . estradiol  1 mg Oral Daily  . fenofibrate  160 mg Oral Daily  . levothyroxine  50 mcg Oral QAC breakfast  . [START ON 10/03/2015] lisinopril  40 mg Oral Daily  . pantoprazole  40 mg Oral Daily  . senna  1 tablet Oral BID  . sodium chloride flush  3 mL Intravenous Q12H      OBJECTIVE: Physical Exam: Filed Vitals:   10/01/15 1900 10/01/15 2309 10/02/15 0435 10/02/15 0835  BP: 159/73 184/80 207/94 178/98  Pulse: 65 67 87   Temp: 98.6 F (37 C) 98.6 F (37 C) 98.7 F (37.1 C)   TempSrc: Oral Oral Oral   Resp: 18 18 18    Height:      Weight:   191 lb 3.2 oz (86.728 kg)   SpO2: 97% 97% 97%     Intake/Output Summary (Last 24 hours) at 10/02/15 1015 Last data filed at 10/02/15 0900  Gross per 24 hour  Intake    240 ml  Output      0 ml  Net    240 ml    Telemetry reveals: SR/ST 90's-110   GEN- The patient is well appearing, alert and oriented x 3 today.   Head- normocephalic, atraumatic Eyes-  Sclera clear, conjunctiva pink Ears- hearing intact Oropharynx- clear Neck- supple, no JVP Lungs- Clear to ausculation bilaterally, normal work of breathing Heart- Regular rate and rhythm, no significant murmurs, no rubs or gallops GI- soft, NT, ND Extremities- no clubbing, cyanosis, or edema Skin- no rash or lesion Psych- euthymic mood, full affect Neuro- no gross deficits appreciated  LABS: Basic Metabolic Panel:  Recent Labs  09/30/15 1700  NA 137  K 3.7  CL 103  CO2 28  GLUCOSE 100*  BUN 12  CREATININE  0.89  CALCIUM 8.8*   CBC:  Recent Labs  09/30/15 1700  WBC 8.6  HGB 14.1  HCT 41.7  MCV 86.3  PLT 331   Cardiac Enzymes:  Recent Labs  09/30/15 2335 10/01/15 0557  TROPONINI <0.03 <0.03   Thyroid Function Tests:  Recent Labs  09/30/15 2335  TSH 4.139   10/01/15: Echocardiogram Study Conclusions - Left ventricle: The cavity size was normal. Systolic function was  normal. The estimated ejection fraction was in the range of 60%  to 65%. Wall motion was normal; there were no regional wall  motion abnormalities. Features are consistent with a pseudonormal  left ventricular filling pattern, with concomitant abnormal  relaxation and increased filling pressure (grade 2 diastolic  dysfunction). Doppler parameters are consistent with high  ventricular filling pressure. - Aortic valve: Trileaflet; normal thickness, mildly calcified  leaflets. - Pulmonary arteries: PA peak pressure: 35 mm Hg (S). Impressions: - The right ventricular systolic pressure was increased consistent  with mild pulmonary hypertension.   ASSESSMENT AND PLAN:   1. Near syncope  Sinus pauses and bradycardia have resolved off the clonidine (and diltiazem)  HR  tends to be fast now, 90-110 is sinus, will add small dose coreg  2. HTN  increase her lisinopril, adding coreg, I thiink this is rebound off of clonidine.  3. 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  4. Mild p.HTN on her echo     Known COPD  Likely to discharge tomorrow   Tommye Standard, Hershal Coria 10/02/2015 10:15 AM  EP Attending  Patient seen and examined. Agree with above. The patient is no longer bradycadic and is now tachycardic with uncontrolled HTN. Will start her on low  dose coreg today and anticipate DC home tomorrow. She is encouraged to lose weight and to eat less salty food. She has had her ACE inhibitor uptitrated. Exam is unchanged.  Mikle [name].D.

## 2015-10-02 NOTE — Care Management Obs Status (Signed)
[facility] NOTIFICATION   Patient Details  Name: [name] MRN: [id number] Date of Birth: Jul 17, 1950   Medicare Observation Status Notification Given:  Yes    Bethena Roys, RN 10/02/2015, 11:28 AM

## 2015-10-02 NOTE — Care Management CC44 (Signed)
Condition Code 44 Documentation Completed  Patient Details  Name: [name] MRN: [id number] Date of Birth: 1950-08-25   Condition Code 44 given:  Yes Patient signature on Condition Code 44 notice:  Yes Documentation of 2 MD's agreement:  Yes Code 44 added to claim:  Yes    Bethena Roys, RN 10/02/2015, 11:35 AM

## 2015-10-02 NOTE — Progress Notes (Signed)
[id number] [place]: Pt's blood pressure 183/98 at 1957, pt given hydralazine IV per MD order. Pt now reports vomiting X 2, BP 175/102. Zofran given at [place], can not be given again until 0048.Cool compress applied to head. MD called and made aware, orders received to give Zofran IV. Pt has no other complaints at this time. Will continue to monitor pt. Rosana Fret RN

## 2015-10-02 NOTE — Care Management CC44 (Signed)
Condition Code 44 Documentation Completed  Patient Details  Name: Brender Hewgley MRN: [id number] Date of Birth: [birth year]   Condition Code 44 given:  Yes Patient signature on Condition Code 44 notice:  Yes Documentation of 2 MD's agreement:  Yes Code 44 added to claim:  Yes    Bethena Roys, RN 10/02/2015, 11:33 AM

## 2015-10-02 NOTE — Discharge Summary (Signed)
ELECTROPHYSIOLOGY PROCEDURE DISCHARGE SUMMARY    Patient ID: [name],  MRN: [id number], DOB/AGE: [birth year] 65 y.o.  Admit date: 09/30/2015 Discharge date: 10/03/15  Primary Care Physician: Robert Bellow, MD Primary Cardiologist: Dr. Acie Fredrickson [phone])  Primary Discharge Diagnosis:  1. Symptomatic bradycardia      Resolved off rate limiting meds  Secondary Discharge Diagnosis:  1. HTN 2. COPD     Quit smoking years ago   Allergies  Allergen Reactions  . Latex Rash  . Tape Rash       Brief HPI: [name] is a 65 y.o. female [facility] recurrent near syncopal events, noted to have episodic bradycardic events and transferred to [facility] for further evaluation and management.    Hospital Course:  The patient was admitted and her home diltiazem which she had self stopped 3 days prior was held as well as her home clonidine.  The patient c/o sudden onset dizzy spells, start Thursday, she checked her BP noting it ok, but her HR in the 40's so she stopped her diltiazem, the episodes continued though where she would suddenly feel like things were going black and has been able so far to sit and they pass without full syncope. She has not had palpitations, no CP or SOB.  She had negative Troponin, an echo noted EF [id number], grade 2 diastolic dysfunction and mild p.HTN.  She was monitored on telemetry noting SR with sinus pauses up to 5 seconds, as well as transient episodes of junctional rhythm 30's-40's.  She was allowed time for her clonidine to wash out since she had taken it at home the morning of her admission, in the last 24 hours, she has not had any further bradycardic events, she has maintained SR, generally 90's.  She has been OOB and ambulatory in the halls without symptoms, no dizzy spells, no near syncope.  Her BP elevated, up-titrated her lisinopril to 40mg  daily.  HR became somewhat elevated low 100's in sinus, added low dose coreg.  There was some  mention of AF in the notes, this is very unclear and all of her EKGs in Epic were reviewed (including her other chart/MR [id number]), none showing true AF, no indication at this time for anticoagulation.  The patient's HR improved with low dose coreg, though her BP remained uncontrolled, she developed an episode of vomiting followed by a headache and a CT head showed no acute abnormality.  Hydralazine was added to her regime.  Her BP improved, early follow up is arranged.   The patient was cleared to discharge by Dr. Curt Bears, and considered stable to home with early follow up on her BP management/HR.  Physical Exam: Filed Vitals:   10/02/15 1957 10/03/15 0358 10/03/15 1021 10/03/15 1430  BP: 183/98 182/101 179/99 156/84  Pulse:  87 87 90  Temp:  98.4 F (36.9 C) 98.6 F (37 C) 98.5 F (36.9 C)  TempSrc:  Oral Oral Oral  Resp:  16 16 18   Height:      Weight:  188 lb 4.8 oz (85.412 kg)    SpO2:  98% 97% 96%    Labs:   Lab Results  Component Value Date   WBC 8.6 09/30/2015   HGB 14.1 09/30/2015   HCT 41.7 09/30/2015   MCV 86.3 09/30/2015   PLT 331 09/30/2015     Recent Labs Lab 09/30/15 1700  NA 137  K 3.7  CL 103  CO2 28  BUN 12  CREATININE 0.89  CALCIUM 8.8*  GLUCOSE 100*    Discharge Medications:    Medication List    STOP taking these medications        cloNIDine 0.2 MG tablet  Commonly known as:  CATAPRES     diltiazem 300 MG 24 hr capsule  Commonly known as:  CARDIZEM CD      TAKE these medications        aspirin EC 81 MG tablet  Take 81 mg by mouth daily.     atorvastatin 20 MG tablet  Commonly known as:  LIPITOR  Take 20 mg by mouth every evening.     carvedilol 3.125 MG tablet  Commonly known as:  COREG  Take 1 tablet (3.125 mg total) by mouth 2 (two) times daily with a meal.     clorazepate 7.5 MG tablet  Commonly known as:  TRANXENE  Take 1 tablet by mouth 3 (three) times daily.     estradiol 1 MG tablet  Commonly known as:  ESTRACE   Take 1 mg by mouth daily.     fenofibrate 145 MG tablet  Commonly known as:  TRICOR  Take 145 mg by mouth every evening.     furosemide 40 MG tablet  Commonly known as:  LASIX  Take 40 mg by mouth daily as needed for fluid.     hydrALAZINE 100 MG tablet  Commonly known as:  APRESOLINE  Take 1 tablet (100 mg total) by mouth every 8 (eight) hours.     HYDROcodone-acetaminophen 10-325 MG tablet  Commonly known as:  NORCO  Take 1 tablet by mouth every 6 (six) hours as needed for moderate pain or severe pain. For pain     levothyroxine 50 MCG tablet  Commonly known as:  SYNTHROID, LEVOTHROID  Take 50 mcg by mouth daily.     lisinopril 40 MG tablet  Commonly known as:  PRINIVIL,ZESTRIL  Take 1 tablet (40 mg total) by mouth daily.     nitroGLYCERIN 0.4 MG SL tablet  Commonly known as:  NITROSTAT  Place 0.4 mg under the tongue every 5 (five) minutes as needed for chest pain.     pantoprazole 40 MG tablet  Commonly known as:  PROTONIX  Take 40 mg by mouth daily.     potassium chloride SA 20 MEQ tablet  Commonly known as:  K-DUR,KLOR-CON  Take 20 mEq by mouth daily as needed (when Lasix 40mg  is taken).     albuterol (2.5 MG/3ML) 0.083% nebulizer solution  Commonly known as:  PROVENTIL  Take 2.5 mg by nebulization every 6 (six) hours as needed for wheezing or shortness of breath.     PROAIR HFA 108 (90 Base) MCG/ACT inhaler  Generic drug:  albuterol  Inhale 1 puff into the lungs every 6 (six) hours as needed for wheezing or shortness of breath.        Disposition:  Home  Follow-up Information    Follow up with Richardson Dopp, PA-C On 11/08/2015.   Specialties:  Cardiology, Physician Assistant   Why:  9:45AM, cardiology follow-up   Contact information:   [address] [address] [phone]       Follow up with Truitt Merle, NP On 10/07/2015.   Specialties:  Nurse Practitioner, Interventional Cardiology, Cardiology, Radiology   Why:   3:30PM   Contact information:   [place] [place] [address] [phone]       Duration of Discharge Encounter: Greater  than 30 minutes including physician time.  Venetia Night, PA-C 10/03/2015 4:30 PM   EP Attending  Patient seen and examined. Agree with above. The patient's HR stabilized and actually rebounded high with stopping of the clonidine, requiring low dose coreg. See followup as above.  Mikle [name].D

## 2015-10-03 ENCOUNTER — Observation Stay (HOSPITAL_COMMUNITY): Payer: Medicare Other

## 2015-10-03 DIAGNOSIS — R55 Syncope and collapse: Secondary | ICD-10-CM | POA: Diagnosis not present

## 2015-10-03 DIAGNOSIS — I442 Atrioventricular block, complete: Secondary | ICD-10-CM | POA: Diagnosis not present

## 2015-10-03 DIAGNOSIS — I10 Essential (primary) hypertension: Secondary | ICD-10-CM | POA: Diagnosis not present

## 2015-10-03 MED ORDER — HYDRALAZINE HCL 50 MG PO TABS
50.0000 mg | ORAL_TABLET | Freq: Three times a day (TID) | ORAL | Status: DC
  Administered 2015-10-03: 50 mg via ORAL
  Filled 2015-10-03: qty 1

## 2015-10-03 MED ORDER — CARVEDILOL 3.125 MG PO TABS: 3.1250 mg | ORAL_TABLET | Freq: Two times a day (BID) | ORAL | Status: DC

## 2015-10-03 MED ORDER — HYDRALAZINE HCL 100 MG PO TABS: 100.0000 mg | ORAL_TABLET | Freq: Three times a day (TID) | ORAL | Status: DC

## 2015-10-03 MED ORDER — HYDRALAZINE HCL 50 MG PO TABS
100.0000 mg | ORAL_TABLET | Freq: Three times a day (TID) | ORAL | Status: DC
  Administered 2015-10-03: 100 mg via ORAL
  Filled 2015-10-03: qty 2

## 2015-10-03 NOTE — Progress Notes (Signed)
SUBJECTIVE: The patient is doing well today.  At this time, she denies chest pain, shortness of breath, or any new concerns.  No dizzy spells, no near syncope or syncope. Episode of vomiting and headache early this AM   . aspirin EC  81 mg Oral Daily  . atorvastatin  20 mg Oral QPM  . carvedilol  3.125 mg Oral BID WC  . clorazepate  7.5 mg Oral TID  . docusate sodium  100 mg Oral BID  . enoxaparin (LOVENOX) injection  40 mg Subcutaneous Q24H  . estradiol  1 mg Oral Daily  . fenofibrate  160 mg Oral Daily  . hydrALAZINE  50 mg Oral Q8H  . levothyroxine  50 mcg Oral QAC breakfast  . lisinopril  40 mg Oral Daily  . pantoprazole  40 mg Oral Daily  . senna  1 tablet Oral BID  . sodium chloride flush  3 mL Intravenous Q12H      OBJECTIVE: Physical Exam: Filed Vitals:   10/02/15 1850 10/02/15 1950 10/02/15 1957 10/03/15 0358  BP: 185/95 183/98 183/98 182/101  Pulse:  85  87  Temp:  98.8 F (37.1 C)  98.4 F (36.9 C)  TempSrc:  Oral  Oral  Resp:  16  16  Height:      Weight:    188 lb 4.8 oz (85.412 kg)  SpO2:  95%  98%    Intake/Output Summary (Last 24 hours) at 10/03/15 0756 Last data filed at 10/02/15 1745  Gross per 24 hour  Intake    720 ml  Output      0 ml  Net    720 ml    Telemetry reveals: SR/ST 80's-110, once 140's   GEN- The patient is well appearing, alert and oriented x 3 today.   Head- normocephalic, atraumatic Eyes-  Sclera clear, conjunctiva pink Ears- hearing intact Oropharynx- clear Neck- supple, no JVP Lungs- Clear to ausculation bilaterally, normal work of breathing Heart- Regular rate and rhythm, no significant murmurs, no rubs or gallops GI- soft, NT, ND Extremities- no clubbing, cyanosis, or edema Skin- no rash or lesion Psych- euthymic mood, full affect Neuro- no gross deficits appreciated  LABS: Basic Metabolic Panel:  Recent Labs  09/30/15 1700  NA 137  K 3.7  CL 103  CO2 28  GLUCOSE 100*  BUN 12  CREATININE 0.89    CALCIUM 8.8*   CBC:  Recent Labs  09/30/15 1700  WBC 8.6  HGB 14.1  HCT 41.7  MCV 86.3  PLT 331   Cardiac Enzymes:  Recent Labs  09/30/15 2335 10/01/15 0557  TROPONINI <0.03 <0.03   Thyroid Function Tests:  Recent Labs  09/30/15 2335  TSH 4.139   10/01/15: Echocardiogram Study Conclusions - Left ventricle: The cavity size was normal. Systolic function was  normal. The estimated ejection fraction was in the range of 60%  to 65%. Wall motion was normal; there were no regional wall  motion abnormalities. Features are consistent with a pseudonormal  left ventricular filling pattern, with concomitant abnormal  relaxation and increased filling pressure (grade 2 diastolic  dysfunction). Doppler parameters are consistent with high  ventricular filling pressure. - Aortic valve: Trileaflet; normal thickness, mildly calcified  leaflets. - Pulmonary arteries: PA peak pressure: 35 mm Hg (S). Impressions: - The right ventricular systolic pressure was increased consistent  with mild pulmonary hypertension.   ASSESSMENT AND PLAN:   1. Near syncope  Sinus pauses and bradycardia have resolved off the clonidine (  and diltiazem)  HR tends to be fast now, 80-110 is sinus      Episode of ST 140's, d/w RN associated with vomiting episode  2. HTN still uncontrolled, required PRN med yesterday despite increaed lisinopril and add on coreg    Kenji Mapel add hydralazine 50mg  TID  3. 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  4. Mild p.HTN on her echo     Known COPD  5. Vomiting episode early this AM     Patient relates a bit of nausea yesterday, followed by a significant headache     Neither are onging complaints, feels  well currently     Olukemi Panchal get CT head this morning given her HTN       Tommye Standard, PA-C 10/03/2015 7:56 AM   I have seen and examined this patient with Tommye Standard.  Agree with above, note added to reflect my findings.  On exam, regular rhythm, no murmurs, lungs clear.  No longer having episodes of heart block.  Had nausea and vomiting this AM with elevated BP and headache.  CT head negative.  Plan for hydralazine for improved BP control.  [name] increase the dose throughout the day and possibly discharge if better controlled.    Lysle Yero M. Scherry Laverne MD 10/03/2015 12:53 PM

## 2015-10-04 ENCOUNTER — Encounter (HOSPITAL_COMMUNITY): Payer: Self-pay | Admitting: Emergency Medicine

## 2015-10-07 ENCOUNTER — Encounter: Payer: Self-pay | Admitting: Nurse Practitioner

## 2015-10-07 ENCOUNTER — Ambulatory Visit (INDEPENDENT_AMBULATORY_CARE_PROVIDER_SITE_OTHER): Payer: Medicare Other | Admitting: Nurse Practitioner

## 2015-10-07 VITALS — BP 112/70 | HR 96 | Ht 63.0 in | Wt 192.8 lb

## 2015-10-07 DIAGNOSIS — I480 Paroxysmal atrial fibrillation: Secondary | ICD-10-CM | POA: Diagnosis not present

## 2015-10-07 DIAGNOSIS — R001 Bradycardia, unspecified: Secondary | ICD-10-CM

## 2015-10-07 DIAGNOSIS — I442 Atrioventricular block, complete: Secondary | ICD-10-CM | POA: Diagnosis not present

## 2015-10-07 DIAGNOSIS — I5189 Other ill-defined heart diseases: Secondary | ICD-10-CM

## 2015-10-07 DIAGNOSIS — I10 Essential (primary) hypertension: Secondary | ICD-10-CM

## 2015-10-07 DIAGNOSIS — I519 Heart disease, unspecified: Secondary | ICD-10-CM

## 2015-10-07 MED ORDER — HYDRALAZINE HCL 100 MG PO TABS: 100.0000 mg | ORAL_TABLET | Freq: Two times a day (BID) | ORAL | 3 refills | Status: DC

## 2015-10-07 NOTE — Patient Instructions (Addendum)
We will be checking the following labs today - BMET   Medication Instructions:    Continue with your current medicines. BUT  I have cut the hydralazine to just twice a day    Testing/Procedures To Be Arranged:  N/A  Follow-Up:   See Dr.Nahser in 3 months  Cancel OV with [employer] weaver, [place] for next month    Other Special Instructions:   Keep a BP diary  Call us if BP running less than [id number] systolic consistently  Use Ms. Dash    If you need a refill on your cardiac medications before your next appointment, please call your pharmacy.   Call the [place] office at [phone] if you have any questions, problems or concerns.

## 2015-10-07 NOTE — Progress Notes (Signed)
CARDIOLOGY OFFICE NOTE  Date:  10/07/2015    [name] Date of Birth: 1950/10/20 Medical Record [id number]  PCP:  Robert Bellow, MD  Cardiologist:  Nahser  Chief Complaint  Patient presents with  . Palpitations  . Irregular Heart Beat    Post hospital visit - seen for Dr. Acie Fredrickson    History of Present Illness: [name] is a 65 y.o. female who presents today for a post hospital visit. Seen for Dr. Acie Fredrickson.   She has not seen him since 2015.   She recently presented to [facility] via EMS with recurrent near syncopal events, noted to have episodic bradycardic events and was transferred to [facility] for further evaluation and management.    The patient was admitted and her home diltiazem which she had herself stopped 3 days prior was held as well as her home clonidine.  The patient had c/o sudden onset dizzy spells,  she checked her BP noting it ok, but her HR in the 40's so she stopped her diltiazem, the episodes continued though where she would suddenly feel like things were going black and was able to sit and they would pass without full syncope. She has not had palpitations, no CP or SOB.  She had negative Troponin, an echo noted EF [id number], grade 2 diastolic dysfunction and mild p.HTN.  She was monitored on telemetry noting SR with sinus pauses up to 5 seconds, as well as transient episodes of junctional rhythm 30's-40's.  She was allowed time for her clonidine to wash out since she had taken it at home the morning of her admission, and she did not have any further bradycardic events. She maintained SR, generally 90's.   Her BP elevated, up-titrated her lisinopril to 40mg  daily.  HR became somewhat elevated low 100's in sinus, and low dose coreg was added.  There was some mention of AF in the notes, but this was noted to be very unclear and all of her EKGs in Epic were reviewed (including her other chart/MR [id number]), none showing true AF, thus no indication at this time  for anticoagulation.  The patient's HR improved with low dose coreg, though her BP remained uncontrolled, she developed an episode of vomiting followed by a headache and a CT head showed no acute abnormality.  Hydralazine was added to her regime.  Her BP improved, early follow up is arranged.  Comes back today. Here with her Education officer, museum from [employer]. Says it was a little rocky at first when she got home but now she is doing pretty good. HR is around 90 to 115. BP has improved. She is only able to tolerate the Hydralazine twice a day. She is adamant that she is not taking her Clonidine or Cardizem but these remain on her list. No chest pain. She is trying to restrict her salt but using No Salt. She is on ACE. No real Lasix use. No NTG. She is happy with how she is doing. Trying to increase her activity level.   Past Medical History:  Diagnosis Date  . Atrial fibrillation (HCC)    ASA daiy; came off Coumadin shortly after stroke  . Blood transfusion without reported diagnosis   . Cancer ([place]) 2016   partial mactectomies x 2  . 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HCC)    left  . Chest tightness   . Chronic pain   . COPD (chronic obstructive pulmonary disease) ([place])   . Coronary artery disease   .  CVA (cerebral infarction) 2010   Right brain secondary to right internal carotid artery dissection for which 2 stents were placed;F/u cerebral angiography in 05/2008-minimal plaque; healing of previously identified left internal carotid artery dissection  . GERD (gastroesophageal reflux disease)   . Heart murmur   . Hyperlipidemia   . Hypertension   . Hypothyroidism   . Kidney stone   . Obesity   . Renal disorder   . S/P splenectomy   . Stroke ([place])   . Stroke [facility]) 2005   2 carotid artery stents in place  . Tobacco use disorder    50 pack years; questionably discontinued in 2010  . Wears glasses     Past Surgical History:  Procedure Laterality Date  . ABDOMINAL HYSTERECTOMY    .  APPENDECTOMY    . BREAST EXCISIONAL BIOPSY     Left  . BREAST LUMPECTOMY WITH NEEDLE LOCALIZATION Left 07/07/2013   Procedure: BREAST LUMPECTOMY WITH NEEDLE LOCALIZATION;  Surgeon: Merrie Roof, MD;  Location: [facility];  Service: General;  Laterality: Left;  . BREAST SURGERY    . cardiac stent x 2     not in heart-onlt head  . CAROTID STENT    . CAROTID STENT Left    x2  . CHOLECYSTECTOMY    . COLONOSCOPY  2011  . ESOPHAGOGASTRODUODENOSCOPY  08/06/2011   Procedure: ESOPHAGOGASTRODUODENOSCOPY (EGD);  Surgeon: Rogene Houston, MD;  [facility];  Service: Endoscopy;  Laterality: N/A;  . EYE SURGERY    . FLEXIBLE SIGMOIDOSCOPY  08/06/2011   Procedure: FLEXIBLE SIGMOIDOSCOPY;  Surgeon: Rogene Houston, MD;  Location: [facility];  Service: Endoscopy;  Laterality: N/A;  . LAPAROTOMY    . LARYNX SURGERY     Polyps excised  . SPLENECTOMY    . SPLENECTOMY, TOTAL     spontaneous rupture     Medications: Current Outpatient Prescriptions  Medication Sig Dispense Refill  . albuterol (PROVENTIL) (2.5 MG/3ML) 0.083% nebulizer solution Take 2.5 mg by nebulization every 6 (six) hours as needed for wheezing or shortness of breath.    Marland Kitchen albuterol (PROVENTIL,VENTOLIN) 90 MCG/ACT inhaler Inhale 2 puffs into the lungs every 4 (four) hours as needed for wheezing or shortness of breath. For shortness of breath    . aspirin EC 81 MG tablet Take 81 mg by mouth daily.    Marland Kitchen atorvastatin (LIPITOR) 20 MG tablet Take 20 mg by mouth daily.    . carvedilol (COREG) 3.125 MG tablet Take 1 tablet (3.125 mg total) by mouth 2 (two) times daily with a meal. 60 tablet 3  . clorazepate (TRANXENE) 7.5 MG tablet Take 7.5 mg by mouth 3 (three) times daily.     Marland Kitchen estradiol (ESTRACE) 1 MG tablet Take 1 mg by mouth daily.    . fenofibrate (TRICOR) 145 MG tablet Take 145 mg by mouth every morning.     . furosemide (LASIX) 40 MG tablet Take 40 mg by mouth daily as needed for fluid.     .  hydrALAZINE (APRESOLINE) 100 MG tablet Take 1 tablet (100 mg total) by mouth 2 (two) times daily. 90 tablet 3  . hydrochlorothiazide (HYDRODIURIL) 25 MG tablet Take 25 mg by mouth every morning.     Marland Kitchen HYDROcodone-acetaminophen (NORCO) 5-325 MG tablet Take 1 tablet by mouth every 4 (four) hours as needed. 10 tablet 0  . levothyroxine (SYNTHROID, LEVOTHROID) 50 MCG tablet Take 50 mcg by mouth daily before breakfast.     .  lisinopril (PRINIVIL,ZESTRIL) 40 MG tablet Take 1 tablet (40 mg total) by mouth daily. 30 tablet 3  . losartan (COZAAR) 100 MG tablet Take 100 mg by mouth every morning.     . pantoprazole (PROTONIX) 40 MG tablet Take 40 mg by mouth daily.    . potassium chloride SA (K-DUR,KLOR-CON) 20 MEQ tablet Take 20 mEq by mouth daily as needed (when Lasix 40mg  is taken).     Marland Kitchen PROAIR HFA 108 (90 Base) MCG/ACT inhaler Inhale 1 puff into the lungs every 6 (six) hours as needed for wheezing or shortness of breath.     . nitroGLYCERIN (NITROSTAT) 0.4 MG SL tablet Place 0.4 mg under the tongue every 5 (five) minutes as needed.       No current facility-administered medications for this visit.     Allergies: Allergies  Allergen Reactions  . Tape Other (See Comments)    Causes blisters to form  . Latex Rash    Blisters  . Tape Rash    Social History: The patient  reports that she quit smoking about 8 years ago. Her smoking use included Cigarettes. She has a 40.00 pack-year smoking history. She does not have any smokeless tobacco history on file. She reports that she does not drink alcohol or use drugs.   Family History: The patient's family history includes Cancer in her sister; Heart failure in her brother.   Review of Systems: Please see the history of present illness.   Otherwise, the review of systems is positive for none.   All other systems are reviewed and negative.   Physical Exam: VS:  BP 112/70   Pulse 96   Ht 5\' 3"  (1.6 m)   Wt 192 lb 12.8 oz (87.5 kg)   BMI 34.15 kg/m   .  BMI Body mass index is 34.15 kg/m.  Wt Readings from Last 3 Encounters:  10/07/15 192 lb 12.8 oz (87.5 kg)  10/03/15 188 lb 4.8 oz (85.4 kg)  08/17/15 185 lb (83.9 kg)   BP by me is 120/80.  General: Pleasant. Well developed, well nourished and in no acute distress.   HEENT: Normal.  Neck: Supple, no JVD, carotid bruits, or masses noted.  Cardiac: Regular rate and rhythm. No murmurs, rubs, or gallops. No edema.  Respiratory:  Lungs are clear to auscultation bilaterally with normal work of breathing.  GI: Soft and nontender.  MS: No deformity or atrophy. Gait and ROM intact.  Skin: Warm and dry. Color is normal.  Neuro:  Strength and sensation are intact and no gross focal deficits noted.  Psych: Alert, appropriate and with normal affect.   LABORATORY DATA:  EKG:  EKG is ordered today. This demonstrates NSR - rate is 96. Poor R wave progression noted - unchanged.  Lab Results  Component Value Date   WBC 8.6 09/30/2015   HGB 14.1 09/30/2015   HCT 41.7 09/30/2015   PLT 331 09/30/2015   GLUCOSE 100 (H) 09/30/2015   CHOL 159 09/11/2010   TRIG 92 09/11/2010   HDL 40 09/11/2010   LDLCALC 101 (H) 09/11/2010   ALT 22 11/16/2014   AST 29 11/16/2014   NA 137 09/30/2015   K 3.7 09/30/2015   CL 103 09/30/2015   CREATININE 0.89 09/30/2015   BUN 12 09/30/2015   CO2 28 09/30/2015   TSH 4.139 09/30/2015   INR 1.01 12/12/2010   HGBA1C  06/01/2008    5.6 (NOTE)   The ADA recommends the following therapeutic goal for glycemic  control related to Hgb A1C measurement:   Goal of Therapy:   < 7.0% Hgb A1C   Reference: American Diabetes Association: Clinical Practice   Recommendations 2008, Diabetes Care,  2008, 31:(Suppl 1).    BNP (last 3 results) No results for input(s): BNP in the last 8760 hours.  ProBNP (last 3 results) No results for input(s): PROBNP in the last 8760 hours.   Other Studies Reviewed Today:  Echo Study Conclusions from 09/2015  - Left ventricle: The  cavity size was normal. Systolic function was   normal. The estimated ejection fraction was in the range of 60%   to 65%. Wall motion was normal; there were no regional wall   motion abnormalities. Features are consistent with a pseudonormal   left ventricular filling pattern, with concomitant abnormal   relaxation and increased filling pressure (grade 2 diastolic   dysfunction). Doppler parameters are consistent with high   ventricular filling pressure. - Aortic valve: Trileaflet; normal thickness, mildly calcified   leaflets. - Pulmonary arteries: PA peak pressure: 35 mm Hg (S).  Impressions:  - The right ventricular systolic pressure was increased consistent   with mild pulmonary hypertension.  Assessment/Plan: 1. Symptomatic bradycardia - resolved with stopping CCB and Clonidine. HR now in the 90's. No further symptoms. Would be hesitant to increase Coreg going forward.   2. Diastolic dysfunction - needs salt restriction and good BP control. BMET today.  3. HTN - much better controlled. I worry that she has had so many med changes that her BP is going to start falling - she will monitor at home and call us if systolic stays below [id number].   4. No clear cut AF noted.  5. PVD  6. Prior stroke.   Current medicines are reviewed with the patient today.  The patient does not have concerns regarding medicines other than what has been noted above.  The following changes have been made:  See above.  Labs/ tests ordered today include:    Orders Placed This Encounter  Procedures  . Basic metabolic panel  . EKG 12-Lead     Disposition:   FU with Dr. Acie Fredrickson in 3 months.    Patient is agreeable to this plan and will call if any problems develop in the interim.   Signed: Burtis Junes, RN, ANP-C 10/07/2015 4:11 PM  [facility] [address] [place], [place]  [address] Phone: [phone] Fax: [phone]

## 2015-10-08 ENCOUNTER — Telehealth: Payer: Self-pay | Admitting: Cardiovascular Disease

## 2015-10-08 LAB — BASIC METABOLIC PANEL
BUN: 13 mg/dL (ref 7–25)
CO2: 27 mmol/L (ref 20–31)
Calcium: 9.9 mg/dL (ref 8.6–10.4)
Chloride: 102 mmol/L (ref 98–110)
Creat: 1.01 mg/dL — ABNORMAL HIGH (ref 0.50–0.99)
Glucose, Bld: 77 mg/dL (ref 65–99)
Potassium: 4.6 mmol/L (ref 3.5–5.3)
Sodium: 140 mmol/L (ref 135–146)

## 2015-10-08 NOTE — Telephone Encounter (Signed)
Pt is asking why she feels tired all the time. Pt states she is not having any problems sleeping at night. Pt states she has felt this way for the past 3 days, since discharge from hospital. Pt states she is doing usual pre hospital activity but tires quicker. Pt denies any other symptoms, including chest pain, shortness of breath, obvious signs of bleeding. Pt states she will monitor her HR, BP, and symptoms, call if any change Pt advised to gradually increase exercise, call in 7-10 days if she does not feel energy improves

## 2015-10-08 NOTE — Telephone Encounter (Signed)
New message      The pt was just seen yesterday and the now the pt has a question.  Why she is so tied? The pt states she sleeps good at night.    The pt states "She's just tied", the pt states all the lab blood work everything was fine    Pt c/o BP issue: STAT if pt c/o blurred vision, one-sided weakness or slurred speech  1. What are your last 5 BP readings? Yesterday when the pt got home 126/84 hr 104, and today this am 175/157 and hr 100, 157/109 hr 104re checked, 136/87 hr 101 at 1542 today 2. Are you having any other symptoms (ex. Dizziness, headache, blurred vision, passed out)? no  3. What is your BP issue? The pt just fells tied   The pt states she take her blood pressure pill and she feels no pain

## 2015-11-08 ENCOUNTER — Ambulatory Visit: Payer: Medicare Other | Admitting: Physician Assistant

## 2016-01-01 ENCOUNTER — Ambulatory Visit (INDEPENDENT_AMBULATORY_CARE_PROVIDER_SITE_OTHER): Payer: Medicare Other | Admitting: Cardiovascular Disease

## 2016-01-01 ENCOUNTER — Other Ambulatory Visit (HOSPITAL_COMMUNITY): Payer: Self-pay | Admitting: Family Medicine

## 2016-01-01 ENCOUNTER — Telehealth: Payer: Self-pay | Admitting: Cardiovascular Disease

## 2016-01-01 ENCOUNTER — Encounter: Payer: Self-pay | Admitting: Cardiovascular Disease

## 2016-01-01 ENCOUNTER — Encounter (INDEPENDENT_AMBULATORY_CARE_PROVIDER_SITE_OTHER): Payer: Self-pay

## 2016-01-01 VITALS — BP 150/92 | HR 72 | Ht 63.0 in | Wt 194.6 lb

## 2016-01-01 DIAGNOSIS — I10 Essential (primary) hypertension: Secondary | ICD-10-CM

## 2016-01-01 DIAGNOSIS — I5032 Chronic diastolic (congestive) heart failure: Secondary | ICD-10-CM | POA: Diagnosis not present

## 2016-01-01 DIAGNOSIS — Z1231 Encounter for screening mammogram for malignant neoplasm of breast: Secondary | ICD-10-CM

## 2016-01-01 NOTE — Progress Notes (Signed)
CARDIOLOGY OFFICE NOTE  Date:  01/01/2016    [name] Date of Birth: 1950/11/05 Medical Record [id number]  PCP:  Robert Bellow, MD  Cardiologist:  Nahser  Chief Complaint  Patient presents with  . Follow-up    Previous notes [name] is a 65 y.o. female who presents   for a post hospital visit.    She has not seen him since 2015.   She recently presented to [facility] ER via EMS with recurrent near syncopal events, noted to have episodic bradycardic events and was transferred to [facility] for further evaluation and management.    The patient was admitted and her home diltiazem which she had herself stopped 3 days prior was held as well as her home clonidine.  The patient had c/o sudden onset dizzy spells,  she checked her BP noting it ok, but her HR in the 40's so she stopped her diltiazem, the episodes continued though where she would suddenly feel like things were going black and was able to sit and they would pass without full syncope. She has not had palpitations, no CP or SOB.  She had negative Troponin, an echo noted EF [id number], grade 2 diastolic dysfunction and mild p.HTN.  She was monitored on telemetry noting SR with sinus pauses up to 5 seconds, as well as transient episodes of junctional rhythm 30's-40's.  She was allowed time for her clonidine to wash out since she had taken it at home the morning of her admission, and she did not have any further bradycardic events. She maintained SR, generally 90's.   Her BP elevated, up-titrated her lisinopril to 40mg  daily.  HR became somewhat elevated low 100's in sinus, and low dose coreg was added.  There was some mention of AF in the notes, but this was noted to be very unclear and all of her EKGs in Epic were reviewed (including her other chart/MR [id number]), none showing true AF, thus no indication at this time for anticoagulation.  The patient's HR improved with low dose coreg, though her BP remained uncontrolled, she  developed an episode of vomiting followed by a headache and a CT head showed no acute abnormality.  Hydralazine was added to her regime.  Her BP improved, early follow up is arranged.  Comes back today. Here with her Education officer, museum from [employer]. Says it was a little rocky at first when she got home but now she is doing pretty good. HR is around 90 to 115. BP has improved. She is only able to tolerate the Hydralazine twice a day. She is adamant that she is not taking her Clonidine or Cardizem but these remain on her list. No chest pain. She is trying to restrict her salt but using No Salt. She is on ACE. No real Lasix use. No NTG. She is happy with how she is doing. Trying to increase her activity level.   Oct. 18, 2017: [name] is seen back today with social worker, [name].   No CP or passing out episodes.  Has some DOE,  Has COPD.   Does not smoke anymore Has stopped some meds and others she is not taking like she should  Is watching her salt .     Past Medical History:  Diagnosis Date  . Atrial fibrillation (HCC)    ASA daiy; came off Coumadin shortly after stroke  . Blood transfusion without reported diagnosis   . Cancer ([place]) 2016   partial mactectomies x  2  . 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HCC)    left  . Chest tightness   . Chronic pain   . COPD (chronic obstructive pulmonary disease) ([place])   . Coronary artery disease   . CVA (cerebral infarction) 2010   Right brain secondary to right internal carotid artery dissection for which 2 stents were placed;F/u cerebral angiography in 05/2008-minimal plaque; healing of previously identified left internal carotid artery dissection  . GERD (gastroesophageal reflux disease)   . Heart murmur   . Hyperlipidemia   . Hypertension   . Hypothyroidism   . Kidney stone   . Obesity   . Renal disorder   . S/P splenectomy   . Stroke ([place])   . Stroke [facility]) 2005   2 carotid artery stents in place  . Tobacco use disorder    50 pack years;  questionably discontinued in 2010  . Wears glasses     Past Surgical History:  Procedure Laterality Date  . ABDOMINAL HYSTERECTOMY    . APPENDECTOMY    . BREAST EXCISIONAL BIOPSY     Left  . BREAST LUMPECTOMY WITH NEEDLE LOCALIZATION Left 07/07/2013   Procedure: BREAST LUMPECTOMY WITH NEEDLE LOCALIZATION;  Surgeon: Merrie Roof, MD;  Location: [place];  Service: General;  Laterality: Left;  . BREAST SURGERY    . cardiac stent x 2     not in heart-onlt head  . CAROTID STENT    . CAROTID STENT Left    x2  . CHOLECYSTECTOMY    . COLONOSCOPY  2011  . ESOPHAGOGASTRODUODENOSCOPY  08/06/2011   Procedure: ESOPHAGOGASTRODUODENOSCOPY (EGD);  Surgeon: Rogene Houston, MD;  [facility];  Service: Endoscopy;  Laterality: N/A;  . EYE SURGERY    . FLEXIBLE SIGMOIDOSCOPY  08/06/2011   Procedure: FLEXIBLE SIGMOIDOSCOPY;  Surgeon: Rogene Houston, MD;  [facility];  Service: Endoscopy;  Laterality: N/A;  . LAPAROTOMY    . LARYNX SURGERY     Polyps excised  . SPLENECTOMY    . SPLENECTOMY, TOTAL     spontaneous rupture     Medications: Current Outpatient Prescriptions  Medication Sig Dispense Refill  . albuterol (PROVENTIL) (2.5 MG/3ML) 0.083% nebulizer solution Take 2.5 mg by nebulization every 6 (six) hours as needed for wheezing or shortness of breath.    Marland Kitchen albuterol (PROVENTIL,VENTOLIN) 90 MCG/ACT inhaler Inhale 2 puffs into the lungs every 4 (four) hours as needed for wheezing or shortness of breath. For shortness of breath    . aspirin EC 81 MG tablet Take 81 mg by mouth daily.    Marland Kitchen atorvastatin (LIPITOR) 20 MG tablet Take 20 mg by mouth daily.    . carvedilol (COREG) 3.125 MG tablet Take 1 tablet (3.125 mg total) by mouth 2 (two) times daily with a meal. 60 tablet 3  . clorazepate (TRANXENE) 7.5 MG tablet Take 7.5 mg by mouth 3 (three) times daily.     Marland Kitchen estradiol (ESTRACE) 1 MG tablet Take 1 mg by mouth daily.    . fenofibrate (TRICOR) 145 MG  tablet Take 145 mg by mouth every morning.     . furosemide (LASIX) 40 MG tablet Take 40 mg by mouth daily as needed for fluid.     . hydrALAZINE (APRESOLINE) 100 MG tablet Take 1 tablet (100 mg total) by mouth 2 (two) times daily. 90 tablet 3  . hydrochlorothiazide (HYDRODIURIL) 25 MG tablet Take 25 mg by mouth every morning.     Marland Kitchen HYDROcodone-acetaminophen ([place])  5-325 MG tablet Take 1 tablet by mouth every 4 (four) hours as needed. 10 tablet 0  . levothyroxine (SYNTHROID, LEVOTHROID) 50 MCG tablet Take 50 mcg by mouth daily before breakfast.     . lisinopril (PRINIVIL,ZESTRIL) 40 MG tablet Take 1 tablet (40 mg total) by mouth daily. 30 tablet 3  . losartan (COZAAR) 100 MG tablet Take 100 mg by mouth every morning.     . nitroGLYCERIN (NITROSTAT) 0.4 MG SL tablet Place 0.4 mg under the tongue every 5 (five) minutes as needed.      . pantoprazole (PROTONIX) 40 MG tablet Take 40 mg by mouth daily.    . potassium chloride SA (K-DUR,KLOR-CON) 20 MEQ tablet Take 20 mEq by mouth daily as needed (when Lasix 40mg  is taken).     Marland Kitchen PROAIR HFA 108 (90 Base) MCG/ACT inhaler Inhale 1 puff into the lungs every 6 (six) hours as needed for wheezing or shortness of breath.      No current facility-administered medications for this visit.     Allergies: Allergies  Allergen Reactions  . Tape Other (See Comments)    Causes blisters to form  . Latex Rash    Blisters  . Tape Rash    Social History: The patient  reports that she quit smoking about 8 years ago. Her smoking use included Cigarettes. She has a 40.00 pack-year smoking history. She does not have any smokeless tobacco history on file. She reports that she does not drink alcohol or use drugs.   Family History: The patient's family history includes Cancer in her sister; Heart failure in her brother.   Review of Systems: Please see the history of present illness.   Otherwise, the review of systems is positive for none.   All other systems are  reviewed and negative.   Physical Exam: VS:  BP (!) 150/92 (BP Location: Left Arm, Patient Position: Sitting, Cuff Size: Normal)   Pulse 72   Ht 5\' 3"  (1.6 m)   Wt 194 lb 9.6 oz (88.3 kg)   BMI 34.47 kg/m  .  BMI Body mass index is 34.47 kg/m.  Wt Readings from Last 3 Encounters:  01/01/16 194 lb 9.6 oz (88.3 kg)  10/07/15 192 lb 12.8 oz (87.5 kg)  10/03/15 188 lb 4.8 oz (85.4 kg)   BP by me is 120/80.  General: Pleasant. Well developed, well nourished and in no acute distress.   HEENT: Normal.  Neck: Supple, no JVD, carotid bruits, or masses noted.  Cardiac: Regular rate and rhythm. No murmurs, rubs, or gallops. No edema.  Respiratory:  Lungs are clear to auscultation bilaterally with normal work of breathing.  GI: Soft and nontender.  MS: No deformity or atrophy. Gait and ROM intact.  Skin: Warm and dry. Color is normal.  Neuro:  Strength and sensation are intact and no gross focal deficits noted.  Psych: Alert, appropriate and with normal affect.   LABORATORY DATA:  EKG:  EKG is ordered today. This demonstrates NSR - rate is 96. Poor R wave progression noted - unchanged.  Lab Results  Component Value Date   WBC 8.6 09/30/2015   HGB 14.1 09/30/2015   HCT 41.7 09/30/2015   PLT 331 09/30/2015   GLUCOSE 77 10/07/2015   CHOL 159 09/11/2010   TRIG 92 09/11/2010   HDL 40 09/11/2010   LDLCALC 101 (H) 09/11/2010   ALT 22 11/16/2014   AST 29 11/16/2014   NA 140 10/07/2015   K 4.6 10/07/2015  CL 102 10/07/2015   CREATININE 1.01 (H) 10/07/2015   BUN 13 10/07/2015   CO2 27 10/07/2015   TSH 4.139 09/30/2015   INR 1.01 12/12/2010   HGBA1C  06/01/2008    5.6 (NOTE)   The ADA recommends the following therapeutic goal for glycemic   control related to Hgb A1C measurement:   Goal of Therapy:   < 7.0% Hgb A1C   Reference: American Diabetes Association: Clinical Practice   Recommendations 2008, Diabetes Care,  2008, 31:(Suppl 1).    BNP (last 3 results) No results for  input(s): BNP in the last 8760 hours.  ProBNP (last 3 results) No results for input(s): PROBNP in the last 8760 hours.   Other Studies Reviewed Today:  Echo Study Conclusions from 09/2015  - Left ventricle: The cavity size was normal. Systolic function was   normal. The estimated ejection fraction was in the range of 60%   to 65%. Wall motion was normal; there were no regional wall   motion abnormalities. Features are consistent with a pseudonormal   left ventricular filling pattern, with concomitant abnormal   relaxation and increased filling pressure (grade 2 diastolic   dysfunction). Doppler parameters are consistent with high   ventricular filling pressure. - Aortic valve: Trileaflet; normal thickness, mildly calcified   leaflets. - Pulmonary arteries: PA peak pressure: 35 mm Hg (S).  Impressions:  - The right ventricular systolic pressure was increased consistent   with mild pulmonary hypertension.  Assessment/Plan: 1. Symptomatic bradycardia - resolved with stopping CCB and Clonidine. HR now in the 90's. No further symptoms. Would be hesitant to increase Coreg going forward.   2. Diastolic dysfunction - needs salt restriction and good BP control. BMET today. Breathing seems to be normal She will call us ( social worker will call us ) with her updated medication list. She would like to follow-up with Dr. Harl Bowie as she lives close to that office.  3. HTN - much better controlled. I worry that she has had so many med changes that her BP is going to start falling - she will monitor at home and call us if systolic stays below [id number].    4. Prior stroke.     No orders of the defined types were placed in this encounter.  She would like to follow up with Dr. Harl Bowie . Will see him in 6 months   Mertie Moores, MD  01/01/2016 10:49 AM    [address],  [place], [place]  [address] Pager [phone] Phone: [phone]; Fax:  [phone]      Patient is agreeable to this plan and will call if any problems develop in the interim.

## 2016-01-01 NOTE — Telephone Encounter (Signed)
Spoke with [name] at [facility] and she advised that patient does not read and write. She states she did not want to embarrass the patient while here in the office.  She will fax a complete list of patient's medications.  I thanked her for her help.

## 2016-01-01 NOTE — Telephone Encounter (Signed)
[name] from ( [employer]) is calling because [name] is not taking her Hydralazine and her lisinopril , but she is taking her Carvedilol 3.12 mg twice a day and her Clonidine /HCL .02 mg twice a day.  Please call if you have any questions . Thanks

## 2016-01-01 NOTE — Patient Instructions (Signed)
Medication Instructions:  Your physician recommends that you continue on your current medications as directed. Please refer to the Current Medication list given to you today.   Labwork: None Ordered   Testing/Procedures: None Ordered   Follow-Up: Your physician wants you to follow-up in: 6 months with Dr. Harl Bowie.  You will receive a reminder letter in the mail two months in advance. If you don't receive a letter, please call our office to schedule the follow-up appointment.   If you need a refill on your cardiac medications before your next appointment, please call your pharmacy.   Thank you for choosing [facility]! Christen Bame, RN [phone]

## 2016-01-06 ENCOUNTER — Other Ambulatory Visit: Payer: Self-pay | Admitting: Nurse Practitioner

## 2016-01-06 NOTE — Progress Notes (Signed)
Reconciliation of medications from list sent by [place]

## 2016-01-06 NOTE — Telephone Encounter (Signed)
Left message for [name] that I have not received faxed medication list and to please re-send to [phone].

## 2016-01-06 NOTE — Telephone Encounter (Signed)
Received medication list from [place]  Patient's medication list has been reconciled.

## 2016-01-16 ENCOUNTER — Ambulatory Visit (HOSPITAL_COMMUNITY)
Admission: RE | Admit: 2016-01-16 | Discharge: 2016-01-16 | Disposition: A | Discharge status: Home / Self Care | Payer: Medicare Other | Source: Ambulatory Visit | Attending: Family Medicine | Admitting: Family Medicine

## 2016-01-16 DIAGNOSIS — Z1231 Encounter for screening mammogram for malignant neoplasm of breast: Secondary | ICD-10-CM

## 2016-01-21 ENCOUNTER — Encounter (HOSPITAL_COMMUNITY): Payer: Self-pay | Admitting: Emergency Medicine

## 2016-01-21 ENCOUNTER — Emergency Department (HOSPITAL_COMMUNITY): Payer: Medicare Other

## 2016-01-21 ENCOUNTER — Inpatient Hospital Stay (HOSPITAL_COMMUNITY)
Admission: EM | Admit: 2016-01-21 | Discharge: 2016-01-24 | DRG: 247 | Disposition: A | Discharge status: Home / Self Care | Payer: Medicare Other | Attending: Cardiovascular Disease | Admitting: Cardiovascular Disease

## 2016-01-21 DIAGNOSIS — K219 Gastro-esophageal reflux disease without esophagitis: Secondary | ICD-10-CM | POA: Diagnosis present

## 2016-01-21 DIAGNOSIS — Z841 Family history of disorders of kidney and ureter: Secondary | ICD-10-CM | POA: Diagnosis not present

## 2016-01-21 DIAGNOSIS — R001 Bradycardia, unspecified: Secondary | ICD-10-CM | POA: Diagnosis present

## 2016-01-21 DIAGNOSIS — Z8261 Family history of arthritis: Secondary | ICD-10-CM

## 2016-01-21 DIAGNOSIS — I10 Essential (primary) hypertension: Secondary | ICD-10-CM | POA: Diagnosis not present

## 2016-01-21 DIAGNOSIS — E782 Mixed hyperlipidemia: Secondary | ICD-10-CM | POA: Diagnosis not present

## 2016-01-21 DIAGNOSIS — I739 Peripheral vascular disease, unspecified: Secondary | ICD-10-CM | POA: Diagnosis not present

## 2016-01-21 DIAGNOSIS — Z833 Family history of diabetes mellitus: Secondary | ICD-10-CM

## 2016-01-21 DIAGNOSIS — I2510 Atherosclerotic heart disease of native coronary artery without angina pectoris: Secondary | ICD-10-CM | POA: Diagnosis present

## 2016-01-21 DIAGNOSIS — I214 Non-ST elevation (NSTEMI) myocardial infarction: Principal | ICD-10-CM

## 2016-01-21 DIAGNOSIS — I272 Pulmonary hypertension, unspecified: Secondary | ICD-10-CM | POA: Diagnosis present

## 2016-01-21 DIAGNOSIS — E669 Obesity, unspecified: Secondary | ICD-10-CM | POA: Diagnosis present

## 2016-01-21 DIAGNOSIS — J449 Chronic obstructive pulmonary disease, unspecified: Secondary | ICD-10-CM | POA: Diagnosis present

## 2016-01-21 DIAGNOSIS — I5032 Chronic diastolic (congestive) heart failure: Secondary | ICD-10-CM | POA: Diagnosis present

## 2016-01-21 DIAGNOSIS — Z91040 Latex allergy status: Secondary | ICD-10-CM | POA: Diagnosis not present

## 2016-01-21 DIAGNOSIS — Z6834 Body mass index (BMI) 34.0-34.9, adult: Secondary | ICD-10-CM

## 2016-01-21 DIAGNOSIS — E039 Hypothyroidism, unspecified: Secondary | ICD-10-CM | POA: Diagnosis present

## 2016-01-21 DIAGNOSIS — Z8673 Personal history of transient ischemic attack (TIA), and cerebral infarction without residual deficits: Secondary | ICD-10-CM

## 2016-01-21 DIAGNOSIS — I110 Hypertensive heart disease with heart failure: Secondary | ICD-10-CM | POA: Diagnosis not present

## 2016-01-21 DIAGNOSIS — Z8249 Family history of ischemic heart disease and other diseases of the circulatory system: Secondary | ICD-10-CM

## 2016-01-21 DIAGNOSIS — Z9081 Acquired absence of spleen: Secondary | ICD-10-CM | POA: Diagnosis not present

## 2016-01-21 DIAGNOSIS — Z90710 Acquired absence of both cervix and uterus: Secondary | ICD-10-CM

## 2016-01-21 DIAGNOSIS — Z809 Family history of malignant neoplasm, unspecified: Secondary | ICD-10-CM

## 2016-01-21 DIAGNOSIS — Z87442 Personal history of urinary calculi: Secondary | ICD-10-CM

## 2016-01-21 DIAGNOSIS — E785 Hyperlipidemia, unspecified: Secondary | ICD-10-CM | POA: Diagnosis present

## 2016-01-21 DIAGNOSIS — I480 Paroxysmal atrial fibrillation: Secondary | ICD-10-CM | POA: Diagnosis present

## 2016-01-21 DIAGNOSIS — Z955 Presence of coronary angioplasty implant and graft: Secondary | ICD-10-CM

## 2016-01-21 DIAGNOSIS — Z79899 Other long term (current) drug therapy: Secondary | ICD-10-CM

## 2016-01-21 DIAGNOSIS — Z9049 Acquired absence of other specified parts of digestive tract: Secondary | ICD-10-CM

## 2016-01-21 DIAGNOSIS — Z87891 Personal history of nicotine dependence: Secondary | ICD-10-CM

## 2016-01-21 DIAGNOSIS — Z888 Allergy status to other drugs, medicaments and biological substances status: Secondary | ICD-10-CM | POA: Diagnosis not present

## 2016-01-21 HISTORY — DX: Major depressive disorder, single episode, unspecified: F32.9

## 2016-01-21 HISTORY — DX: Cerebral infarction, unspecified: I63.9

## 2016-01-21 HISTORY — DX: Anxiety disorder, unspecified: F41.9

## 2016-01-21 HISTORY — DX: Unspecified osteoarthritis, unspecified site: M19.90

## 2016-01-21 HISTORY — DX: Migraine, unspecified, not intractable, without status migrainosus: G43.909

## 2016-01-21 HISTORY — DX: Malignant melanoma of left upper limb, including shoulder: C43.62

## 2016-01-21 HISTORY — DX: Low back pain, unspecified: M54.50

## 2016-01-21 HISTORY — DX: Personal history of other diseases of the digestive system: Z87.19

## 2016-01-21 HISTORY — DX: Malignant neoplasm of unspecified site of left female breast: C50.912

## 2016-01-21 HISTORY — DX: Personal history of urinary calculi: Z87.442

## 2016-01-21 HISTORY — DX: Unspecified malignant neoplasm of skin of unspecified lower limb, including hip: C44.701

## 2016-01-21 HISTORY — DX: Low back pain: M54.5

## 2016-01-21 HISTORY — DX: Depression, unspecified: F32.A

## 2016-01-21 HISTORY — DX: Other chronic pain: G89.29

## 2016-01-21 HISTORY — DX: Non-ST elevation (NSTEMI) myocardial infarction: I21.4

## 2016-01-21 HISTORY — DX: Personal history of other medical treatment: Z92.89

## 2016-01-21 LAB — CBC
HCT: 45.9 % (ref 36.0–46.0)
Hemoglobin: 15.3 g/dL — ABNORMAL HIGH (ref 12.0–15.0)
MCH: 28.8 pg (ref 26.0–34.0)
MCHC: 33.3 g/dL (ref 30.0–36.0)
MCV: 86.4 fL (ref 78.0–100.0)
PLATELETS: 377 10*3/uL (ref 150–400)
RBC: 5.31 MIL/uL — ABNORMAL HIGH (ref 3.87–5.11)
RDW: 13.1 % (ref 11.5–15.5)
WBC: 11.6 10*3/uL — AB (ref 4.0–10.5)

## 2016-01-21 LAB — BASIC METABOLIC PANEL
Anion gap: 6 (ref 5–15)
BUN: 13 mg/dL (ref 6–20)
CALCIUM: 9.3 mg/dL (ref 8.9–10.3)
CO2: 26 mmol/L (ref 22–32)
Chloride: 103 mmol/L (ref 101–111)
Creatinine, Ser: 0.81 mg/dL (ref 0.44–1.00)
GFR calc Af Amer: >60 mL/min (ref >60)
GLUCOSE: 145 mg/dL — AB (ref 65–99)
Potassium: 3.5 mmol/L (ref 3.5–5.1)
SODIUM: 135 mmol/L (ref 135–145)

## 2016-01-21 LAB — PROTIME-INR
INR: 1.07
PROTHROMBIN TIME: 13.9 s (ref 11.4–15.2)

## 2016-01-21 LAB — TROPONIN I
TROPONIN I: 1.57 ng/mL — AB (ref <0.03)
Troponin I: 2.03 ng/mL (ref <0.03)

## 2016-01-21 LAB — HEPARIN LEVEL (UNFRACTIONATED): HEPARIN UNFRACTIONATED: 0.18 [IU]/mL — AB (ref 0.30–0.70)

## 2016-01-21 MED ORDER — ALBUTEROL SULFATE HFA 108 (90 BASE) MCG/ACT IN AERS: 1.0000 | INHALATION_SPRAY | Freq: Four times a day (QID) | RESPIRATORY_TRACT | Status: DC | PRN

## 2016-01-21 MED ORDER — SODIUM CHLORIDE 0.9% FLUSH: 3.0000 mL | INTRAVENOUS | Status: DC | PRN

## 2016-01-21 MED ORDER — SODIUM CHLORIDE 0.9 % IV SOLN: INTRAVENOUS | Status: DC

## 2016-01-21 MED ORDER — FENOFIBRATE 160 MG PO TABS
160.0000 mg | ORAL_TABLET | Freq: Every day | ORAL | Status: DC
  Administered 2016-01-22 – 2016-01-24 (×3): 160 mg via ORAL
  Filled 2016-01-21 (×3): qty 1

## 2016-01-21 MED ORDER — ESTRADIOL 1 MG PO TABS
1.0000 mg | ORAL_TABLET | Freq: Every day | ORAL | Status: DC
  Administered 2016-01-22 – 2016-01-24 (×3): 1 mg via ORAL
  Filled 2016-01-21 (×3): qty 1

## 2016-01-21 MED ORDER — MORPHINE SULFATE (PF) 4 MG/ML IV SOLN
4.0000 mg | INTRAVENOUS | Status: DC | PRN
  Administered 2016-01-22: 4 mg via INTRAVENOUS

## 2016-01-21 MED ORDER — NITROGLYCERIN 0.4 MG SL SUBL
0.4000 mg | SUBLINGUAL_TABLET | SUBLINGUAL | Status: DC | PRN
  Filled 2016-01-21: qty 1

## 2016-01-21 MED ORDER — SODIUM CHLORIDE 0.9 % WEIGHT BASED INFUSION
1.0000 mL/kg/h | INTRAVENOUS | Status: DC
  Administered 2016-01-22 (×2): 1 mL/kg/h via INTRAVENOUS

## 2016-01-21 MED ORDER — SODIUM CHLORIDE 0.9 % WEIGHT BASED INFUSION
3.0000 mL/kg/h | INTRAVENOUS | Status: DC
  Administered 2016-01-22: 3 mL/kg/h via INTRAVENOUS

## 2016-01-21 MED ORDER — ASPIRIN 81 MG PO CHEW
324.0000 mg | CHEWABLE_TABLET | Freq: Once | ORAL | Status: AC
  Administered 2016-01-21: 324 mg via ORAL
  Filled 2016-01-21: qty 4

## 2016-01-21 MED ORDER — HEPARIN (PORCINE) IN NACL 100-0.45 UNIT/ML-% IJ SOLN
1150.0000 [IU]/h | INTRAMUSCULAR | Status: DC
  Administered 2016-01-21: 850 [IU]/h via INTRAVENOUS
  Filled 2016-01-21: qty 250

## 2016-01-21 MED ORDER — ALBUTEROL SULFATE (2.5 MG/3ML) 0.083% IN NEBU: 2.5000 mg | INHALATION_SOLUTION | Freq: Four times a day (QID) | RESPIRATORY_TRACT | Status: DC | PRN

## 2016-01-21 MED ORDER — HYDRALAZINE HCL 50 MG PO TABS
100.0000 mg | ORAL_TABLET | Freq: Two times a day (BID) | ORAL | Status: DC
  Administered 2016-01-21 – 2016-01-24 (×6): 100 mg via ORAL
  Filled 2016-01-21 (×6): qty 2

## 2016-01-21 MED ORDER — METOPROLOL TARTRATE 5 MG/5ML IV SOLN
5.0000 mg | Freq: Once | INTRAVENOUS | Status: AC
  Administered 2016-01-21: 5 mg via INTRAVENOUS
  Filled 2016-01-21: qty 5

## 2016-01-21 MED ORDER — LISINOPRIL 40 MG PO TABS
40.0000 mg | ORAL_TABLET | Freq: Every day | ORAL | Status: DC
  Administered 2016-01-22 – 2016-01-24 (×3): 40 mg via ORAL
  Filled 2016-01-21 (×2): qty 1
  Filled 2016-01-21: qty 2

## 2016-01-21 MED ORDER — LORAZEPAM 2 MG/ML IJ SOLN
0.5000 mg | Freq: Once | INTRAMUSCULAR | Status: AC
  Administered 2016-01-21: 0.5 mg via INTRAVENOUS
  Filled 2016-01-21: qty 1

## 2016-01-21 MED ORDER — HEPARIN BOLUS VIA INFUSION
4000.0000 [IU] | Freq: Once | INTRAVENOUS | Status: AC
  Administered 2016-01-21: 4000 [IU] via INTRAVENOUS

## 2016-01-21 MED ORDER — ATORVASTATIN CALCIUM 80 MG PO TABS
80.0000 mg | ORAL_TABLET | Freq: Every day | ORAL | Status: DC
  Administered 2016-01-22 – 2016-01-23 (×2): 80 mg via ORAL
  Filled 2016-01-21 (×2): qty 1

## 2016-01-21 MED ORDER — ASPIRIN 81 MG PO CHEW
81.0000 mg | CHEWABLE_TABLET | ORAL | Status: AC
  Administered 2016-01-22: 81 mg via ORAL
  Filled 2016-01-21: qty 1

## 2016-01-21 MED ORDER — CLORAZEPATE DIPOTASSIUM 3.75 MG PO TABS
7.5000 mg | ORAL_TABLET | Freq: Three times a day (TID) | ORAL | Status: DC
  Administered 2016-01-21 – 2016-01-24 (×7): 7.5 mg via ORAL
  Filled 2016-01-21 (×8): qty 2

## 2016-01-21 MED ORDER — SODIUM CHLORIDE 0.9% FLUSH
3.0000 mL | Freq: Two times a day (BID) | INTRAVENOUS | Status: DC
  Administered 2016-01-21: 3 mL via INTRAVENOUS

## 2016-01-21 MED ORDER — CARVEDILOL 3.125 MG PO TABS
3.1250 mg | ORAL_TABLET | Freq: Two times a day (BID) | ORAL | Status: DC
  Administered 2016-01-21 – 2016-01-22 (×2): 3.125 mg via ORAL
  Filled 2016-01-21 (×2): qty 1

## 2016-01-21 MED ORDER — SODIUM CHLORIDE 0.9 % IV SOLN: 250.0000 mL | INTRAVENOUS | Status: DC | PRN

## 2016-01-21 MED ORDER — LEVOTHYROXINE SODIUM 50 MCG PO TABS
50.0000 ug | ORAL_TABLET | Freq: Every day | ORAL | Status: DC
  Administered 2016-01-22 – 2016-01-24 (×3): 50 ug via ORAL
  Filled 2016-01-21 (×3): qty 1

## 2016-01-21 MED ORDER — POTASSIUM CHLORIDE CRYS ER 20 MEQ PO TBCR: 20.0000 meq | EXTENDED_RELEASE_TABLET | Freq: Every day | ORAL | Status: DC | PRN

## 2016-01-21 MED ORDER — PANTOPRAZOLE SODIUM 40 MG PO TBEC
40.0000 mg | DELAYED_RELEASE_TABLET | Freq: Every day | ORAL | Status: DC
  Administered 2016-01-22 – 2016-01-24 (×3): 40 mg via ORAL
  Filled 2016-01-21 (×3): qty 1

## 2016-01-21 MED ORDER — NITROGLYCERIN IN D5W 200-5 MCG/ML-% IV SOLN: 0.0000 ug/min | INTRAVENOUS | Status: DC

## 2016-01-21 NOTE — H&P (Signed)
Patient ID: [name] MRN: [id number], DOB/AGE: 06/14/1950   Admit date: 01/21/2016   Primary Physician: Robert Bellow, MD Primary Cardiologist: Dr Acie Fredrickson  Pt. Profile:  CAD, NSTEMI  Problem List  Past Medical History:  Diagnosis Date  . Atrial fibrillation (HCC)    ASA daiy; came off Coumadin shortly after stroke  . Blood transfusion without reported diagnosis   . Cancer ([place]) 2016   partial mactectomies x 2  . 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HCC)    left  . Chronic pain   . COPD (chronic obstructive pulmonary disease) ([place])   . Coronary artery disease   . CVA (cerebral infarction) 2010   Right brain secondary to right internal carotid artery dissection for which 2 stents were placed;F/u cerebral angiography in 05/2008-minimal plaque; healing of previously identified left internal carotid artery dissection  . GERD (gastroesophageal reflux disease)   . Heart murmur   . Hyperlipidemia   . Hypertension   . Hypothyroidism   . Kidney stone   . Obesity   . Renal disorder   . S/P splenectomy   . Stroke [facility]) 2005   2 carotid artery stents in place  . Tobacco use disorder    50 pack years; questionably discontinued in 2010  . Wears glasses     Past Surgical History:  Procedure Laterality Date  . ABDOMINAL HYSTERECTOMY    . APPENDECTOMY    . BREAST EXCISIONAL BIOPSY     Left  . BREAST LUMPECTOMY WITH NEEDLE LOCALIZATION Left 07/07/2013   Procedure: BREAST LUMPECTOMY WITH NEEDLE LOCALIZATION;  Surgeon: Merrie Roof, MD;  Location: [facility];  Service: General;  Laterality: Left;  . BREAST SURGERY    . cardiac stent x 2     not in heart-onlt head  . CAROTID STENT    . CAROTID STENT Left    x2  . CHOLECYSTECTOMY    . COLONOSCOPY  2011  . ESOPHAGOGASTRODUODENOSCOPY  08/06/2011   Procedure: ESOPHAGOGASTRODUODENOSCOPY (EGD);  Surgeon: Rogene Houston, MD;  [facility];  Service: Endoscopy;  Laterality: N/A;  . EYE SURGERY    .  FLEXIBLE SIGMOIDOSCOPY  08/06/2011   Procedure: FLEXIBLE SIGMOIDOSCOPY;  Surgeon: Rogene Houston, MD;  [facility];  Service: Endoscopy;  Laterality: N/A;  . LAPAROTOMY    . LARYNX SURGERY     Polyps excised  . SPLENECTOMY    . SPLENECTOMY, TOTAL     spontaneous rupture     Allergies  Allergies  Allergen Reactions  . Tape Other (See Comments)    Causes blisters to form  . Latex Rash    Blisters  . Tape Rash   HPI  Patient is a 65 y.o. female with a PMHx of chest pain, who was admitted to [facility] on 01/21/2016 for a NSTEMI, scheduled for a left sided cath with Dr Martinique tomorrow.  She is a patient of Dr Acie Fredrickson, last seen on 01/05/2016. Recent admission for bradycardia, and recurrent near syncopal events, resolved after holding Diltiazem and clonidine. EF [id number], grade 2 diastolic dysfunction and mild p.HTN. She was monitored on telemetry noting SR with sinus pauses up to 5 seconds, as well as transient episodes of junctional rhythm 30's-40's. She was allowed time for her clonidine to wash out since she had taken it at home the morning of her admission, and she did not have any further bradycardic events. She maintained SR, generally 90's. Her BP elevated, up-titrated her lisinopril to 40mg   daily and adding carvedilol. There was some mention of AF in the notes, but this was noted to be very unclear and all of her EKGs in Epic were reviewed (including her other chart/MR [id number]), none showing true AF, thus no indication at this time for anticoagulation. The patient's HR improved with low dose coreg and hydralazine.  She presented to the ER with chest pain that started at 8 pm the last night, radiating to her left arm, improved with NTG, but followed by nausea. She felt weak and SOB today. Troponin in the ER 1.57-->2.03.   Home Medications  Prior to Admission medications   Medication Sig Start Date End Date Taking? Authorizing Provider  albuterol (PROVENTIL) (2.5 MG/3ML)  0.083% nebulizer solution Take 2.5 mg by nebulization every 6 (six) hours as needed for wheezing or shortness of breath.   Yes Historical Provider, MD  atorvastatin (LIPITOR) 20 MG tablet Take 20 mg by mouth every evening.  05/14/14  Yes Historical Provider, MD  carvedilol (COREG) 3.125 MG tablet Take 1 tablet (3.125 mg total) by mouth 2 (two) times daily with a meal. 10/03/15  Yes Renee Dyane Dustman, PA-C  cetirizine (ZYRTEC) 10 MG tablet Take 10 mg by mouth daily.   Yes Historical Provider, MD  clorazepate (TRANXENE) 7.5 MG tablet Take 1 tablet by mouth 3 (three) times daily. 01/03/16  Yes Historical Provider, MD  estradiol (ESTRACE) 1 MG tablet Take 1 mg by mouth daily. 07/18/15  Yes Historical Provider, MD  fenofibrate (TRICOR) 145 MG tablet Take 145 mg by mouth every morning.    Yes Historical Provider, MD  furosemide (LASIX) 40 MG tablet Take 40 mg by mouth daily as needed for fluid.  07/20/14  Yes Historical Provider, MD  hydrALAZINE (APRESOLINE) 100 MG tablet Take 1 tablet (100 mg total) by mouth 2 (two) times daily. 10/07/15  Yes Burtis Junes, NP  HYDROcodone-acetaminophen (NORCO) 10-325 MG tablet Take 1 tablet by mouth 4 (four) times daily as needed for moderate pain (Lower back).   Yes Historical Provider, MD  levothyroxine (SYNTHROID, LEVOTHROID) 50 MCG tablet Take 50 mcg by mouth daily before breakfast.  07/20/14  Yes Historical Provider, MD  lisinopril (PRINIVIL,ZESTRIL) 40 MG tablet Take 1 tablet (40 mg total) by mouth daily. 10/02/15  Yes Renee Dyane Dustman, PA-C  nitroGLYCERIN (NITROSTAT) 0.4 MG SL tablet Place 0.4 mg under the tongue every 5 (five) minutes as needed.     Yes Historical Provider, MD  pantoprazole (PROTONIX) 40 MG tablet Take 40 mg by mouth daily. 09/13/15  Yes Historical Provider, MD  potassium chloride SA (K-DUR,KLOR-CON) 20 MEQ tablet Take 20 mEq by mouth daily as needed (when Lasix 40mg  is taken).  08/19/15  Yes Historical Provider, MD  PROAIR HFA 108 (90 Base) MCG/ACT inhaler  Inhale 1 puff into the lungs every 6 (six) hours as needed for wheezing or shortness of breath.  09/13/15  Yes Historical Provider, MD  senna (SENOKOT) 8.6 MG tablet Take 1 tablet by mouth daily.   Yes Historical Provider, MD  temazepam (RESTORIL) 30 MG capsule Take 30 mg by mouth at bedtime as needed for sleep.   Yes Historical Provider, MD  HYDROcodone-acetaminophen (NORCO) 5-325 MG tablet Take 1 tablet by mouth every 4 (four) hours as needed. Patient not taking: Reported on 01/21/2016 08/17/15   Daleen Bo, MD    . sodium chloride   Intravenous STAT     Family History  Family History  Problem Relation Age of Onset  . Heart disease    .  Arthritis    . Cancer    . Diabetes    . Kidney disease    . Cancer Sister   . Heart failure Brother     Social History  Social History   Social History  . Marital status: Divorced    Spouse name: N/A  . Number of children: 2  . Years of education: N/A   Occupational History  . Disabled   . retired    Social History Main Topics  . Smoking status: Former Smoker    Packs/day: 1.00    Years: 40.00    Types: Cigarettes    Quit date: 07/06/2007  . Smokeless tobacco: Never Used     Comment: quit about 2007  . Alcohol use No  . Drug use: No  . Sexual activity: Not on file   Other Topics Concern  . Not on file   Social History Narrative   ** Merged History Encounter **        Review of Systems General:  No chills, fever, night sweats or weight changes.  Cardiovascular:  No chest pain, dyspnea on exertion, edema, orthopnea, palpitations, paroxysmal nocturnal dyspnea. Dermatological: No rash, lesions/masses Respiratory: No cough, dyspnea Urologic: No hematuria, dysuria Abdominal:   No nausea, vomiting, diarrhea, bright red blood per rectum, melena, or hematemesis Neurologic:  No visual changes, wkns, changes in mental status. All other systems reviewed and are otherwise negative except as noted above.  Physical Exam  Blood  pressure (!) 130/118, pulse 91, temperature 98.6 F (37 C), temperature source Oral, resp. rate 16, height 5\' 3"  (1.6 m), weight 194 lb (88 kg), SpO2 97 %.  General: Pleasant, NAD Psych: Normal affect. Neuro: Alert and oriented X 3. Moves all extremities spontaneously. HEENT: Normal  Neck: Supple without bruits or JVD. Lungs:  Resp regular and unlabored, CTA. Heart: RRR no s3, s4, 2/6 systolic murmur in the LLSB. Abdomen: Soft, non-tender, non-distended, BS + x 4.  Extremities: No clubbing, cyanosis or edema. DP/PT/Radials 2+ and equal bilaterally.  Labs  Recent Labs  01/21/16 1331 01/21/16 1557  TROPONINI 1.57* 2.03*   Lab Results  Component Value Date   WBC 11.6 (H) 01/21/2016   HGB 15.3 (H) 01/21/2016   HCT 45.9 01/21/2016   MCV 86.4 01/21/2016   PLT 377 01/21/2016    Recent Labs Lab 01/21/16 1331  NA 135  K 3.5  CL 103  CO2 26  BUN 13  CREATININE 0.81  CALCIUM 9.3  GLUCOSE 145*   Lab Results  Component Value Date   CHOL 159 09/11/2010   HDL 40 09/11/2010   LDLCALC 101 (H) 09/11/2010   TRIG 92 09/11/2010   Radiology/Studies  Dg Chest 2 View  Result Date: 01/21/2016 CLINICAL DATA:  Left arm pain for 2 days, shortness of breath EXAM: CHEST  2 VIEW COMPARISON:  A 2016 FINDINGS: The heart size and mediastinal contours are within normal limits. Both lungs are clear. The visualized skeletal structures are unremarkable. IMPRESSION: No active cardiopulmonary disease. Electronically Signed   By: Kathreen Devoid   On: 01/21/2016 13:34   Mm Screening Breast Tomo Bilateral  Result Date: 01/17/2016 CLINICAL DATA:  Screening. EXAM: 2D DIGITAL SCREENING BILATERAL MAMMOGRAM WITH CAD AND ADJUNCT TOMO COMPARISON:  Previous exam(s). ACR Breast Density Category c: The breast tissue is heterogeneously dense, which may obscure small masses. FINDINGS: There are no findings suspicious for malignancy. Images were processed with CAD. IMPRESSION: No mammographic evidence of malignancy. A  result letter of this screening  mammogram will be mailed directly to the patient. RECOMMENDATION: Screening mammogram in one year. (Code:SM-B-01Y) BI-RADS CATEGORY  1: Negative. Electronically Signed   By: Lajean Manes M.D.   On: 01/17/2016 13:16   Echocardiogram - 10/01/2015 - Left ventricle: The cavity size was normal. Systolic function was   normal. The estimated ejection fraction was in the range of 60%   to 65%. Wall motion was normal; there were no regional wall   motion abnormalities. Features are consistent with a pseudonormal   left ventricular filling pattern, with concomitant abnormal   relaxation and increased filling pressure (grade 2 diastolic   dysfunction). Doppler parameters are consistent with high   ventricular filling pressure. - Aortic valve: Trileaflet; normal thickness, mildly calcified   leaflets. - Pulmonary arteries: PA peak pressure: 35 mm Hg (S).  Impressions:  - The right ventricular systolic pressure was increased consistent   with mild pulmonary hypertension.  ECG: SR, LAFB, unchanged from prior     ASSESSMENT AND PLAN  1. NSTEMI, troponin 1.57--> 2.0, unchanged ECG, we will schedule for a cardiac cath in the am, start Heparin drip, NTG drip, continue coreg, lisinopril, ASA, increase atorvastatin to 80 mg po daily.  2. Hypertensive heart disease with CHF -start NTG drip, continue home meds  3. Chronic diastolic CHF -  hold lasix for the cath in the am, she appears euvolemic,   DVT PPX - in Heparin   Signed, Ena Dawley, MD, [facility] 01/21/2016, 7:13 PM

## 2016-01-21 NOTE — ED Notes (Signed)
PT also adds that she started having a nonproductive cough this am.

## 2016-01-21 NOTE — ED Notes (Signed)
CRITICAL VALUE ALERT  Critical value received:  Troponin 1.57  Date of notification:  01/21/16  Time of notification:  1422  Critical value read back:YES  Nurse who received alert:  Lavenia Atlas, RN  MD notified (1st page):  1423  MD notified (2nd page): Dr. Thurnell Garbe

## 2016-01-21 NOTE — Progress Notes (Signed)
ANTICOAGULATION CONSULT NOTE - Initial Consult  Pharmacy Consult for Heparin Indication: chest pain/ACS  Allergies  Allergen Reactions  . Tape Other (See Comments)    Causes blisters to form  . Latex Rash    Blisters  . Tape Rash    Patient Measurements: Height: 5\' 3"  (160 cm) Weight: 194 lb (88 kg) IBW/kg (Calculated) : 52.4 HEPARIN DW (KG): 72.2  Vital Signs: Temp: 97.5 F (36.4 C) (11/07 1311) Temp Source: Temporal (11/07 1311) BP: 174/105 (11/07 1456) Pulse Rate: 102 (11/07 1456)  Labs:  Recent Labs  01/21/16 1331  HGB 15.3*  HCT 45.9  PLT 377  CREATININE 0.81  TROPONINI 1.57*    Estimated Creatinine Clearance: 71.8 mL/min (by C-G formula based on SCr of 0.81 mg/dL).   Medical History: Past Medical History:  Diagnosis Date  . Atrial fibrillation (HCC)    ASA daiy; came off Coumadin shortly after stroke  . Blood transfusion without reported diagnosis   . Cancer ([place]) 2016   partial mactectomies x 2  . 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HCC)    left  . Chest tightness   . Chronic pain   . COPD (chronic obstructive pulmonary disease) ([place])   . Coronary artery disease   . CVA (cerebral infarction) 2010   Right brain secondary to right internal carotid artery dissection for which 2 stents were placed;F/u cerebral angiography in 05/2008-minimal plaque; healing of previously identified left internal carotid artery dissection  . GERD (gastroesophageal reflux disease)   . Heart murmur   . Hyperlipidemia   . Hypertension   . Hypothyroidism   . Kidney stone   . Obesity   . Renal disorder   . S/P splenectomy   . Stroke ([place])   . Stroke [facility]) 2005   2 carotid artery stents in place  . Tobacco use disorder    50 pack years; questionably discontinued in 2010  . Wears glasses     Medications:  See med rec  Assessment: 65 yo female presents to ED with chest pain and left arm pain since last night , with some relief from NTG tabs. Also, c/o generalized  weakness, some Nausea and vomiting, and increased SOB on exertion. Initial troponins elevated at 1.57. Start heparin for anticoagulation of ACS.  Goal of Therapy:  Heparin level 0.3-0.7 units/ml Monitor platelets by anticoagulation protocol: Yes   Plan:  Give 4000 units bolus x 1 Start heparin infusion at 850 units/hr Check anti-Xa level in 6 hours and daily while on heparin Continue to monitor H&H and platelets   Isac Sarna, BS Vena Austria, BCPS Clinical Pharmacist Pager [phone]  01/21/2016,3:07 PM

## 2016-01-21 NOTE — ED Provider Notes (Signed)
[place] DEPT Provider Note   CSN: [id number] Arrival date & time: 01/21/16  1300     History   Chief Complaint Chief Complaint  Patient presents with  . Chest Pain    HPI [name] is a 65 y.o. female.   Chest Pain    Pt was seen at 1440. Per pt, c/o gradual onset and worsening of persistent left arm "pain" that began several days ago, worse since this morning at 0400 this morning. Pt states for the past several days she has had intermittent chest "pain" and left arm "pain" which was relieved with her own SL ntg. Pt states her "left arm started to hurt" at 0400 this morning, so she took her own SL ntg. States this was followed by N/V. Pt continues to c/o left arm "pain" and generalized weakness. Pt states she feels SOB today when she exerts herself. Denies abd pain, no back pain, no fevers, no palpitations.   Past Medical History:  Diagnosis Date  . Atrial fibrillation (HCC)    ASA daiy; came off Coumadin shortly after stroke  . Blood transfusion without reported diagnosis   . Cancer ([place]) 2016   partial mactectomies x 2  . 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HCC)    left  . Chest tightness   . Chronic pain   . COPD (chronic obstructive pulmonary disease) ([place])   . Coronary artery disease   . CVA (cerebral infarction) 2010   Right brain secondary to right internal carotid artery dissection for which 2 stents were placed;F/u cerebral angiography in 05/2008-minimal plaque; healing of previously identified left internal carotid artery dissection  . GERD (gastroesophageal reflux disease)   . Heart murmur   . Hyperlipidemia   . Hypertension   . Hypothyroidism   . Kidney stone   . Obesity   . Renal disorder   . S/P splenectomy   . Stroke ([place])   . Stroke [facility]) 2005   2 carotid artery stents in place  . Tobacco use disorder    50 pack years; questionably discontinued in 2010  . Wears glasses     Patient Active Problem List   Diagnosis Date Noted  . Chronic  diastolic CHF (congestive heart failure) ([place]) 01/01/2016  . Symptomatic bradycardia 09/30/2015  . CVA (cerebral infarction) 09/30/2015  . 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 Essential hypertension 09/30/2015  . Chronic pain 09/30/2015  . Hyperlipidemia 09/30/2015  . Surgical menopause on hormone replacement therapy 09/30/2015  . GERD (gastroesophageal reflux disease) 09/30/2015  . Paroxysmal atrial fibrillation ([place]) 09/30/2015  . Hypothyroidism 09/30/2015  . Complete heart block ([place]) 09/30/2015  . Breast mass 04/27/2013  . Deltoid tendinitis of left shoulder 11/23/2012  . Rotator cuff tear 10/06/2012  . Rotator cuff syndrome of left shoulder 10/06/2012  . Pain in joint, shoulder region 10/06/2012  . Tendonitis, calcific, shoulder 07/26/2012  . CVA (cerebral infarction)   . COPD (chronic obstructive pulmonary disease) ([place])   . Hypertension   . Hyperlipidemia   . Obesity   . Chest tightness   . Tobacco use disorder     Past Surgical History:  Procedure Laterality Date  . ABDOMINAL HYSTERECTOMY    . APPENDECTOMY    . BREAST EXCISIONAL BIOPSY     Left  . BREAST LUMPECTOMY WITH NEEDLE LOCALIZATION Left 07/07/2013   Procedure: BREAST LUMPECTOMY WITH NEEDLE LOCALIZATION;  Surgeon: Merrie Roof, MD;  Location: [place];  Service: General;  Laterality: Left;  . BREAST SURGERY    .  cardiac stent x 2     not in heart-onlt head  . CAROTID STENT    . CAROTID STENT Left    x2  . CHOLECYSTECTOMY    . COLONOSCOPY  2011  . ESOPHAGOGASTRODUODENOSCOPY  08/06/2011   Procedure: ESOPHAGOGASTRODUODENOSCOPY (EGD);  Surgeon: Rogene Houston, MD;  [facility];  Service: Endoscopy;  Laterality: N/A;  . EYE SURGERY    . FLEXIBLE SIGMOIDOSCOPY  08/06/2011   Procedure: FLEXIBLE SIGMOIDOSCOPY;  Surgeon: Rogene Houston, MD;  [facility];  Service: Endoscopy;  Laterality: N/A;  . LAPAROTOMY    . LARYNX SURGERY     Polyps excised  . SPLENECTOMY    .  SPLENECTOMY, TOTAL     spontaneous rupture    OB History    Gravida Para Term Preterm AB Living   2 2 2  0 0 2   SAB TAB Ectopic Multiple Live Births   0 0 0           Home Medications    Prior to Admission medications   Medication Sig Start Date End Date Taking? Authorizing Provider  albuterol (PROVENTIL) (2.5 MG/3ML) 0.083% nebulizer solution Take 2.5 mg by nebulization every 6 (six) hours as needed for wheezing or shortness of breath.    Historical Provider, MD  atorvastatin (LIPITOR) 20 MG tablet Take 20 mg by mouth daily. 05/14/14   Historical Provider, MD  carvedilol (COREG) 3.125 MG tablet Take 1 tablet (3.125 mg total) by mouth 2 (two) times daily with a meal. 10/03/15   Renee Dyane Dustman, PA-C  cetirizine (ZYRTEC) 10 MG tablet Take 10 mg by mouth daily.    Historical Provider, MD  cloNIDine (CATAPRES) 0.2 MG tablet Take 0.2 mg by mouth 2 (two) times daily.    Historical Provider, MD  estradiol (ESTRACE) 1 MG tablet Take 1 mg by mouth daily. 07/18/15   Historical Provider, MD  fenofibrate (TRICOR) 145 MG tablet Take 145 mg by mouth every morning.     Historical Provider, MD  furosemide (LASIX) 40 MG tablet Take 40 mg by mouth daily as needed for fluid.  07/20/14   Historical Provider, MD  hydrALAZINE (APRESOLINE) 100 MG tablet Take 1 tablet (100 mg total) by mouth 2 (two) times daily. 10/07/15   Burtis Junes, NP  HYDROcodone-acetaminophen (NORCO) 5-325 MG tablet Take 1 tablet by mouth every 4 (four) hours as needed. 08/17/15   Daleen Bo, MD  levothyroxine (SYNTHROID, LEVOTHROID) 50 MCG tablet Take 50 mcg by mouth daily before breakfast.  07/20/14   Historical Provider, MD  lisinopril (PRINIVIL,ZESTRIL) 40 MG tablet Take 1 tablet (40 mg total) by mouth daily. 10/02/15   Renee Dyane Dustman, PA-C  nitroGLYCERIN (NITROSTAT) 0.4 MG SL tablet Place 0.4 mg under the tongue every 5 (five) minutes as needed.      Historical Provider, MD  pantoprazole (PROTONIX) 40 MG tablet Take 40 mg by mouth  daily. 09/13/15   Historical Provider, MD  potassium chloride SA (K-DUR,KLOR-CON) 20 MEQ tablet Take 20 mEq by mouth daily as needed (when Lasix 40mg  is taken).  08/19/15   Historical Provider, MD  PROAIR HFA 108 (90 Base) MCG/ACT inhaler Inhale 1 puff into the lungs every 6 (six) hours as needed for wheezing or shortness of breath.  09/13/15   Historical Provider, MD    Family History Family History  Problem Relation Age of Onset  . Heart disease    . Arthritis    . Cancer    .  Diabetes    . Kidney disease    . Cancer Sister   . Heart failure Brother     Social History Social History  Substance Use Topics  . Smoking status: Former Smoker    Packs/day: 1.00    Years: 40.00    Types: Cigarettes    Quit date: 07/06/2007  . Smokeless tobacco: Never Used     Comment: quit about 2007  . Alcohol use No     Allergies   Tape; Latex; and Tape   Review of Systems Review of Systems  Cardiovascular: Positive for chest pain.  ROS: Statement: All systems negative except as marked or noted in the HPI; Constitutional: Negative for fever and chills. ; ; Eyes: Negative for eye pain, redness and discharge. ; ; ENMT: Negative for ear pain, hoarseness, nasal congestion, sinus pressure and sore throat. ; ; Cardiovascular: +CP, SOB. Negative for palpitations, diaphoresis, and peripheral edema. ; ; Respiratory: Negative for cough, wheezing and stridor. ; ; Gastrointestinal: +N/V. Negative for diarrhea, abdominal pain, blood in stool, hematemesis, jaundice and rectal bleeding. . ; ; Genitourinary: Negative for dysuria, flank pain and hematuria. ; ; Musculoskeletal: Negative for back pain and neck pain. Negative for swelling and trauma.; ; Skin: Negative for pruritus, rash, abrasions, blisters, bruising and skin lesion.; ; Neuro: Negative for headache, lightheadedness and neck stiffness. Negative for weakness, altered level of consciousness, altered mental status, extremity weakness, paresthesias, involuntary  movement, seizure and syncope.       Physical Exam Updated Vital Signs BP (!) 174/105 (BP Location: Right Arm)   Pulse 102   Temp 97.5 F (36.4 C) (Temporal)   Resp 14   Ht 5\' 3"  (1.6 m)   Wt 194 lb (88 kg)   SpO2 97%   BMI 34.37 kg/m   Physical Exam 1445: Physical examination:  Nursing notes reviewed; Vital signs and O2 SAT reviewed;  Constitutional: Well developed, Well nourished, Well hydrated, In no acute distress; Head:  Normocephalic, atraumatic; Eyes: EOMI, PERRL, No scleral icterus; ENMT: Mouth and pharynx normal, Mucous membranes moist; Neck: Supple, Full range of motion, No lymphadenopathy; Cardiovascular: Tachycardic rate and rhythm, No gallop; Respiratory: Breath sounds clear & equal bilaterally, No wheezes.  Speaking full sentences with ease, Normal respiratory effort/excursion; Chest: Nontender, Movement normal; Abdomen: Soft, Nontender, Nondistended, Normal bowel sounds; Genitourinary: No CVA tenderness; Extremities: Pulses normal, No tenderness, No edema, No calf edema or asymmetry.; Neuro: AA&Ox3, Major CN grossly intact.  Speech clear. No gross focal motor or sensory deficits in extremities.; Skin: Color normal, Warm, Dry.   ED Treatments / Results  Labs (all labs ordered are listed, but only abnormal results are displayed)   EKG  EKG Interpretation  Date/Time:  Tuesday January 21 2016 13:07:38 EST Ventricular Rate:  108 PR Interval:  164 QRS Duration: 78 QT Interval:  360 QTC Calculation: 482 R Axis:   -22 Text Interpretation:  Sinus tachycardia with occasional Premature ventricular complexes Possible Inferior infarct , age undetermined Cannot rule out Anterior infarct , age undetermined Baseline wander When compared with ECG of 11/03/2014 Rate faster Confirmed by [facility]  MD, Seher Schlagel [phone]) on 01/21/2016 3:12:37 PM        EKG Interpretation  Date/Time:  Tuesday January 21 2016 14:53:30 EST Ventricular Rate:  104 PR Interval:  164 QRS  Duration: 86 QT Interval:  376 QTC Calculation: 495 R Axis:   -47 Text Interpretation:  Sinus tachycardia LAD, consider left anterior fascicular block Minimal ST depression Borderline prolonged QT interval  Baseline wander Nonspecific ST and T wave abnormality is now Present Since last tracing of earlier today Confirmed by [facility]  MD, Nunzio Cory [phone]) on 01/21/2016 3:17:14 PM         Radiology   Procedures Procedures (including critical care time)  Medications Ordered in ED Medications  nitroGLYCERIN (NITROSTAT) SL tablet 0.4 mg (0.4 mg Sublingual Not Given 01/21/16 1454)  morphine 4 MG/ML injection 4 mg (not administered)  metoprolol (LOPRESSOR) injection 5 mg (not administered)  aspirin chewable tablet 324 mg (324 mg Oral Given 01/21/16 1453)     Initial Impression / Assessment and Plan / ED Course  I have reviewed the triage vital signs and the nursing notes.  Pertinent labs & imaging results that were available during my care of the patient were reviewed by me and considered in my medical decision making (see chart for details).   MDM Reviewed: previous chart, nursing note and vitals Reviewed previous: labs and ECG Interpretation: labs, ECG and x-ray Total time providing critical care: 30-74 minutes. This excludes time spent performing separately reportable procedures and services. Consults: cardiology   CRITICAL CARE Performed by: Alfonzo Feller Total critical care time: 35 minutes Critical care time was exclusive of separately billable procedures and treating other patients. Critical care was necessary to treat or prevent imminent or life-threatening deterioration. Critical care was time spent personally by me on the following activities: development of treatment plan with patient and/or surrogate as well as nursing, discussions with consultants, evaluation of patient's response to treatment, examination of patient, obtaining history from patient or surrogate,  ordering and performing treatments and interventions, ordering and review of laboratory studies, ordering and review of radiographic studies, pulse oximetry and re-evaluation of patient's condition.  Results for orders placed or performed during the hospital encounter of [id number]  Basic metabolic panel  Result Value Ref Range   Sodium 135 135 - 145 mmol/L   Potassium 3.5 3.5 - 5.1 mmol/L   Chloride 103 101 - 111 mmol/L   CO2 26 22 - 32 mmol/L   Glucose, Bld 145 (H) 65 - 99 mg/dL   BUN 13 6 - 20 mg/dL   Creatinine, Ser 0.81 0.44 - 1.00 mg/dL   Calcium 9.3 8.9 - 10.3 mg/dL   GFR calc non Af Amer >60 >60 mL/min   GFR calc Af Amer >60 >60 mL/min   Anion gap 6 5 - 15  CBC  Result Value Ref Range   WBC 11.6 (H) 4.0 - 10.5 K/uL   RBC 5.31 (H) 3.87 - 5.11 MIL/uL   Hemoglobin 15.3 (H) 12.0 - 15.0 g/dL   HCT 45.9 36.0 - 46.0 %   MCV 86.4 78.0 - 100.0 fL   MCH 28.8 26.0 - 34.0 pg   MCHC 33.3 30.0 - 36.0 g/dL   RDW 13.1 11.5 - 15.5 %   Platelets 377 150 - 400 K/uL  Troponin I  Result Value Ref Range   Troponin I 1.57 (HH) <0.03 ng/mL   Dg Chest 2 View Result Date: 01/21/2016 CLINICAL DATA:  Left arm pain for 2 days, shortness of breath EXAM: CHEST  2 VIEW COMPARISON:  A 2016 FINDINGS: The heart size and mediastinal contours are within normal limits. Both lungs are clear. The visualized skeletal structures are unremarkable. IMPRESSION: No active cardiopulmonary disease. Electronically Signed   By: Kathreen Devoid   On: 01/21/2016 13:34    1455:  ASA given. Pt currently denies CP.  T/C to [facility] Cards Dr. Johnsie Cancel, case discussed, including:  HPI, pertinent PM/SHx, VS/PE, dx testing, ED course and treatment:  IV heparin, IV lopressor 5mg , he has viewed the EKG's, pt not Code Stemi, requests to call [facility] to transfer to either ED or Cath Lab.  1505:  T/C to [facility], case discussed, including:  HPI, pertinent PM/SHx, VS/PE, dx testing, ED course and treatment:  Suggests to page Dr. Acie Fredrickson  for transfer (Dr. Burt Knack scrubbed in procedure).  1540:  T/C to [facility] Dr. Burt Knack, case discussed, including:  HPI, pertinent PM/SHx, VS/PE, dx testing, ED course and treatment:  Agreeable to accept transfer/admit, requests to write temporary orders, obtain stepdown bed. Dx and testing d/w pt and family.  Questions answered.  Verb understanding, agreeable to transfer to [facility] for admit. Pt tearful and anxious, requesting "something for my nerves." Will give small dose IV ativan.       Final Clinical Impressions(s) / ED Diagnoses   Final diagnoses:  None    New Prescriptions New Prescriptions   No medications on file      Francine Graven, DO 01/25/16 2131

## 2016-01-21 NOTE — ED Notes (Signed)
Patient lying in bed crying at this time. States "can you please ask the doctor to get me something for my nerves. I have panic attacks." Dr Elise Benne made aware.

## 2016-01-21 NOTE — ED Triage Notes (Signed)
PT states that she went to bed at 2000 last night and started having left arm pain and some chest pain. PT states she took a nitroglycerin at home at 0400 this am and stated she had some relief but then had nausea and vomiting following the nitroglycerin. PT arrives and c/o generalized weakness and left arm pain today. PT also states increased SOB on exertion.

## 2016-01-22 ENCOUNTER — Encounter (HOSPITAL_COMMUNITY)
Admission: EM | Disposition: A | Discharge status: Home / Self Care | Payer: Self-pay | Source: Home / Self Care | Attending: Cardiovascular Disease

## 2016-01-22 DIAGNOSIS — I480 Paroxysmal atrial fibrillation: Secondary | ICD-10-CM

## 2016-01-22 DIAGNOSIS — I10 Essential (primary) hypertension: Secondary | ICD-10-CM

## 2016-01-22 DIAGNOSIS — I2510 Atherosclerotic heart disease of native coronary artery without angina pectoris: Secondary | ICD-10-CM

## 2016-01-22 HISTORY — PX: CARDIAC CATHETERIZATION: SHX172

## 2016-01-22 LAB — CBC
HCT: 43.6 % (ref 36.0–46.0)
Hemoglobin: 14.4 g/dL (ref 12.0–15.0)
MCH: 28.7 pg (ref 26.0–34.0)
MCHC: 33 g/dL (ref 30.0–36.0)
MCV: 87 fL (ref 78.0–100.0)
Platelets: 350 K/uL (ref 150–400)
RBC: 5.01 MIL/uL (ref 3.87–5.11)
RDW: 13.8 % (ref 11.5–15.5)
WBC: 9.9 K/uL (ref 4.0–10.5)

## 2016-01-22 LAB — POCT ACTIVATED CLOTTING TIME: ACTIVATED CLOTTING TIME: 494 s

## 2016-01-22 LAB — TROPONIN I: Troponin I: 2.12 ng/mL (ref <0.03)

## 2016-01-22 LAB — HEPARIN LEVEL (UNFRACTIONATED)
Heparin Unfractionated: 0.29 [IU]/mL — ABNORMAL LOW (ref 0.30–0.70)
Heparin Unfractionated: <0.1 [IU]/mL — ABNORMAL LOW (ref 0.30–0.70)

## 2016-01-22 SURGERY — LEFT HEART CATH AND CORONARY ANGIOGRAPHY
Anesthesia: LOCAL

## 2016-01-22 MED ORDER — TICAGRELOR 90 MG PO TABS
90.0000 mg | ORAL_TABLET | Freq: Two times a day (BID) | ORAL | Status: DC
  Administered 2016-01-22 – 2016-01-24 (×5): 90 mg via ORAL
  Filled 2016-01-22 (×5): qty 1

## 2016-01-22 MED ORDER — LIDOCAINE HCL (PF) 1 % IJ SOLN
INTRAMUSCULAR | Status: DC | PRN
  Administered 2016-01-22: 2 mL

## 2016-01-22 MED ORDER — IOPAMIDOL (ISOVUE-370) INJECTION 76%
INTRAVENOUS | Status: AC
  Filled 2016-01-22: qty 50

## 2016-01-22 MED ORDER — IOPAMIDOL (ISOVUE-370) INJECTION 76%
INTRAVENOUS | Status: AC
  Filled 2016-01-22: qty 100

## 2016-01-22 MED ORDER — MIDAZOLAM HCL 2 MG/2ML IJ SOLN
INTRAMUSCULAR | Status: AC
  Filled 2016-01-22: qty 2

## 2016-01-22 MED ORDER — CLONAZEPAM 0.5 MG PO TABS
0.5000 mg | ORAL_TABLET | Freq: Two times a day (BID) | ORAL | Status: DC | PRN
  Administered 2016-01-22 – 2016-01-24 (×4): 0.5 mg via ORAL
  Filled 2016-01-22 (×4): qty 1

## 2016-01-22 MED ORDER — TICAGRELOR 90 MG PO TABS
ORAL_TABLET | ORAL | Status: DC | PRN
  Administered 2016-01-22: 180 mg via ORAL

## 2016-01-22 MED ORDER — NITROGLYCERIN IN D5W 200-5 MCG/ML-% IV SOLN
INTRAVENOUS | Status: AC
  Filled 2016-01-22: qty 250

## 2016-01-22 MED ORDER — NITROGLYCERIN IN D5W 200-5 MCG/ML-% IV SOLN: 0.0000 ug/min | INTRAVENOUS | Status: DC

## 2016-01-22 MED ORDER — NITROGLYCERIN IN D5W 200-5 MCG/ML-% IV SOLN
INTRAVENOUS | Status: DC | PRN
  Administered 2016-01-22: 10 ug/min via INTRAVENOUS

## 2016-01-22 MED ORDER — LABETALOL HCL 5 MG/ML IV SOLN
INTRAVENOUS | Status: AC
  Filled 2016-01-22: qty 4

## 2016-01-22 MED ORDER — CARVEDILOL 25 MG PO TABS
25.0000 mg | ORAL_TABLET | Freq: Two times a day (BID) | ORAL | Status: DC
  Administered 2016-01-22 – 2016-01-24 (×4): 25 mg via ORAL
  Filled 2016-01-22 (×4): qty 1

## 2016-01-22 MED ORDER — FENTANYL CITRATE (PF) 100 MCG/2ML IJ SOLN
INTRAMUSCULAR | Status: AC
  Filled 2016-01-22: qty 2

## 2016-01-22 MED ORDER — HEPARIN SODIUM (PORCINE) 1000 UNIT/ML IJ SOLN
INTRAMUSCULAR | Status: DC | PRN
  Administered 2016-01-22: 4500 [IU] via INTRAVENOUS

## 2016-01-22 MED ORDER — FENTANYL CITRATE (PF) 100 MCG/2ML IJ SOLN
INTRAMUSCULAR | Status: DC | PRN
  Administered 2016-01-22 (×4): 25 ug via INTRAVENOUS

## 2016-01-22 MED ORDER — MIDAZOLAM HCL 2 MG/2ML IJ SOLN
INTRAMUSCULAR | Status: DC | PRN
  Administered 2016-01-22 (×2): 1 mg via INTRAVENOUS
  Administered 2016-01-22: 2 mg via INTRAVENOUS
  Administered 2016-01-22: 1 mg via INTRAVENOUS

## 2016-01-22 MED ORDER — GUAIFENESIN-CODEINE 100-10 MG/5ML PO SOLN
5.0000 mL | Freq: Four times a day (QID) | ORAL | Status: DC | PRN
Start: 2016-01-22 — End: 2016-01-24
  Administered 2016-01-22 – 2016-01-24 (×3): 5 mL via ORAL
  Filled 2016-01-22 (×4): qty 5

## 2016-01-22 MED ORDER — SODIUM CHLORIDE 0.9 % IV SOLN
INTRAVENOUS | Status: DC | PRN
  Administered 2016-01-22: 1.75 mg/kg/h via INTRAVENOUS

## 2016-01-22 MED ORDER — LIDOCAINE HCL (PF) 1 % IJ SOLN
INTRAMUSCULAR | Status: AC
  Filled 2016-01-22: qty 30

## 2016-01-22 MED ORDER — LABETALOL HCL 5 MG/ML IV SOLN
INTRAVENOUS | Status: DC | PRN
  Administered 2016-01-22 (×3): 20 mg via INTRAVENOUS

## 2016-01-22 MED ORDER — HEPARIN (PORCINE) IN NACL 2-0.9 UNIT/ML-% IJ SOLN
INTRAMUSCULAR | Status: AC
  Filled 2016-01-22: qty 1000

## 2016-01-22 MED ORDER — VERAPAMIL HCL 2.5 MG/ML IV SOLN
INTRAVENOUS | Status: DC | PRN
  Administered 2016-01-22: 10 mL via INTRA_ARTERIAL

## 2016-01-22 MED ORDER — BIVALIRUDIN 250 MG IV SOLR
INTRAVENOUS | Status: AC
  Filled 2016-01-22: qty 250

## 2016-01-22 MED ORDER — MORPHINE SULFATE (PF) 4 MG/ML IV SOLN
INTRAVENOUS | Status: AC
  Filled 2016-01-22: qty 1

## 2016-01-22 MED ORDER — VERAPAMIL HCL 2.5 MG/ML IV SOLN
INTRAVENOUS | Status: AC
  Filled 2016-01-22: qty 2

## 2016-01-22 MED ORDER — BIVALIRUDIN BOLUS VIA INFUSION - CUPID
INTRAVENOUS | Status: DC | PRN
  Administered 2016-01-22: 66.075 mg via INTRAVENOUS

## 2016-01-22 MED ORDER — IOPAMIDOL (ISOVUE-370) INJECTION 76%
INTRAVENOUS | Status: DC | PRN
Start: 2016-01-22 — End: 2016-01-22
  Administered 2016-01-22: 190 mL via INTRA_ARTERIAL

## 2016-01-22 MED ORDER — SODIUM CHLORIDE 0.9% FLUSH: 3.0000 mL | INTRAVENOUS | Status: DC | PRN

## 2016-01-22 MED ORDER — SODIUM CHLORIDE 0.9% FLUSH
3.0000 mL | Freq: Two times a day (BID) | INTRAVENOUS | Status: DC
  Administered 2016-01-23 – 2016-01-24 (×3): 3 mL via INTRAVENOUS

## 2016-01-22 MED ORDER — TICAGRELOR 90 MG PO TABS
ORAL_TABLET | ORAL | Status: AC
  Filled 2016-01-22: qty 2

## 2016-01-22 MED ORDER — HYDROCODONE-ACETAMINOPHEN 7.5-325 MG PO TABS
1.0000 | ORAL_TABLET | Freq: Four times a day (QID) | ORAL | Status: DC | PRN
Start: 2016-01-22 — End: 2016-01-24
  Administered 2016-01-22 – 2016-01-24 (×5): 1 via ORAL
  Filled 2016-01-22 (×5): qty 1

## 2016-01-22 MED ORDER — NITROGLYCERIN 1 MG/10 ML FOR IR/CATH LAB
INTRA_ARTERIAL | Status: AC
  Filled 2016-01-22: qty 10

## 2016-01-22 MED ORDER — HEPARIN SODIUM (PORCINE) 1000 UNIT/ML IJ SOLN
INTRAMUSCULAR | Status: AC
  Filled 2016-01-22: qty 1

## 2016-01-22 MED ORDER — SODIUM CHLORIDE 0.9 % WEIGHT BASED INFUSION: 3.0000 mL/kg/h | INTRAVENOUS | Status: AC

## 2016-01-22 MED ORDER — SODIUM CHLORIDE 0.9 % IV SOLN: 250.0000 mL | INTRAVENOUS | Status: DC | PRN

## 2016-01-22 MED ORDER — ASPIRIN 81 MG PO CHEW
81.0000 mg | CHEWABLE_TABLET | Freq: Every day | ORAL | Status: DC
  Administered 2016-01-23 – 2016-01-24 (×2): 81 mg via ORAL
  Filled 2016-01-22 (×2): qty 1

## 2016-01-22 MED ORDER — NITROGLYCERIN 1 MG/10 ML FOR IR/CATH LAB
INTRA_ARTERIAL | Status: DC | PRN
  Administered 2016-01-22: 200 ug via INTRACORONARY

## 2016-01-22 MED ORDER — ACETAMINOPHEN 325 MG PO TABS
650.0000 mg | ORAL_TABLET | Freq: Once | ORAL | Status: AC
  Administered 2016-01-22: 650 mg via ORAL
  Filled 2016-01-22 (×2): qty 2

## 2016-01-22 SURGICAL SUPPLY — 22 items
BALLN MOZEC 2.0X12 (BALLOONS) ×2
BALLN MOZEC 2.50X14 (BALLOONS) ×2
BALLN ~~LOC~~ EUPHORA RX 2.75X15 (BALLOONS) ×2
BALLOON MOZEC 2.0X12 (BALLOONS) IMPLANT
BALLOON MOZEC 2.50X14 (BALLOONS) IMPLANT
BALLOON ~~LOC~~ EUPHORA RX 2.75X15 (BALLOONS) IMPLANT
CATH 5FR JL3.5 JR4 ANG PIG MP (CATHETERS) ×1 IMPLANT
DEVICE RAD COMP TR BAND LRG (VASCULAR PRODUCTS) ×1 IMPLANT
GLIDESHEATH SLEND SS 6F .021 (SHEATH) ×1 IMPLANT
GUIDE CATH RUNWAY 6FR CLS3.5 (CATHETERS) ×1 IMPLANT
GUIDE CATH RUNWAY 6FR FR4 (CATHETERS) ×1 IMPLANT
GUIDEWIRE INQWIRE 1.5J.035X260 (WIRE) IMPLANT
INQWIRE 1.5J .035X260CM (WIRE) ×2
KIT ENCORE 26 ADVANTAGE (KITS) ×1 IMPLANT
KIT HEART LEFT (KITS) ×2 IMPLANT
PACK CARDIAC CATHETERIZATION (CUSTOM PROCEDURE TRAY) ×2 IMPLANT
STENT SYNERGY DES 2.75X20 (Permanent Stent) ×1 IMPLANT
SYR MEDRAD MARK V 150ML (SYRINGE) ×2 IMPLANT
TRANSDUCER W/STOPCOCK (MISCELLANEOUS) ×2 IMPLANT
TUBING CIL FLEX 10 FLL-RA (TUBING) ×2 IMPLANT
WIRE HI TORQ WHISPER MS 190CM (WIRE) ×1 IMPLANT
WIRE MARVEL STR TIP 190CM (WIRE) ×1 IMPLANT

## 2016-01-22 NOTE — H&P (View-Only) (Signed)
Patient Name: [name] Date of Encounter: 01/22/2016  Primary Cardiologist: [facility] Problem List     Active Problems:   NSTEMI (non-ST elevation myocardial infarction) [facility])     Subjective   65 yo female, hx of atrial fib, bradycardia, grade 2 diastolic dysfunciton admitted with CP / NSTEMI   I have seen her in the past,  She has transferred her care to Dr. Harl Bowie due to location of the office and trnasportation issues.    Inpatient Medications    Scheduled Meds: . sodium chloride   Intravenous STAT  . atorvastatin  80 mg Oral q1800  . carvedilol  3.125 mg Oral BID WC  . clorazepate  7.5 mg Oral TID  . estradiol  1 mg Oral Daily  . fenofibrate  160 mg Oral Daily  . hydrALAZINE  100 mg Oral BID  . levothyroxine  50 mcg Oral QAC breakfast  . lisinopril  40 mg Oral Daily  . pantoprazole  40 mg Oral Daily  . sodium chloride flush  3 mL Intravenous Q12H   Continuous Infusions: . sodium chloride 1 mL/kg/hr (01/22/16 0714)  . heparin 1,000 Units/hr (01/22/16 0100)  . nitroGLYCERIN     PRN Meds: sodium chloride, albuterol, guaiFENesin-codeine, morphine injection, nitroGLYCERIN, potassium chloride SA, sodium chloride flush   Vital Signs    Vitals:   01/21/16 2222 01/22/16 0030 01/22/16 0429 01/22/16 0747  BP: (!) 141/83 (!) 141/80 (!) 153/98 (!) 159/85  Pulse:  92 100 88  Resp: (!) 21  19 15   Temp:  98.4 F (36.9 C) 98.4 F (36.9 C) 98.3 F (36.8 C)  TempSrc:  Oral Oral Oral  SpO2:  95% 97% 97%  Weight:   194 lb 4.8 oz (88.1 kg)   Height:        Intake/Output Summary (Last 24 hours) at 01/22/16 0931 Last data filed at 01/22/16 0200  Gross per 24 hour  Intake            88.48 ml  Output                0 ml  Net            88.48 ml   Filed Weights   01/21/16 1310 01/22/16 0429  Weight: 194 lb (88 kg) 194 lb 4.8 oz (88.1 kg)    Physical Exam    GEN: Well nourished, well developed, in no acute distress.  HEENT: Grossly normal.    Neck: Supple, no JVD, carotid bruits, or masses. Cardiac: RRR, no murmurs, rubs, or gallops. No clubbing, cyanosis, edema.  Radials/DP/PT 2+ and equal bilaterally.  Respiratory:  Respirations regular and unlabored, clear to auscultation bilaterally. GI: Soft, nontender, nondistended, BS + x 4. MS: no deformity or atrophy. Skin: warm and dry, no rash. Neuro:  Strength and sensation are intact. Psych: AAOx3.  Normal affect.  Labs    CBC  Recent Labs  01/21/16 1331 01/22/16 0454  WBC 11.6* 9.9  HGB 15.3* 14.4  HCT 45.9 43.6  MCV 86.4 87.0  PLT 377 [id number]   Basic Metabolic Panel  Recent Labs  01/21/16 1331  NA 135  K 3.5  CL 103  CO2 26  GLUCOSE 145*  BUN 13  CREATININE 0.81  CALCIUM 9.3   Liver Function Tests No results for input(s): AST, ALT, ALKPHOS, BILITOT, PROT, ALBUMIN in the last 72 hours. No results for input(s): LIPASE, AMYLASE in the last 72 hours. Cardiac Enzymes  Recent Labs  01/21/16 1331 01/21/16 1557  TROPONINI 1.57* 2.03*   BNP Invalid input(s): POCBNP D-Dimer No results for input(s): DDIMER in the last 72 hours. Hemoglobin A1C No results for input(s): HGBA1C in the last 72 hours. Fasting Lipid Panel No results for input(s): CHOL, HDL, LDLCALC, TRIG, CHOLHDL, LDLDIRECT in the last 72 hours. Thyroid Function Tests No results for input(s): TSH, T4TOTAL, T3FREE, THYROIDAB in the last 72 hours.  Invalid input(s): FREET3  Telemetry    NSR  - Personally Reviewed  ECG    Sinus tach at 104.   NS ST abn.  - Personally Reviewed  Radiology    Dg Chest 2 View  Result Date: 01/21/2016 CLINICAL DATA:  Left arm pain for 2 days, shortness of breath EXAM: CHEST  2 VIEW COMPARISON:  A 2016 FINDINGS: The heart size and mediastinal contours are within normal limits. Both lungs are clear. The visualized skeletal structures are unremarkable. IMPRESSION: No active cardiopulmonary disease. Electronically Signed   By: Kathreen Devoid   On: 01/21/2016 13:34     Cardiac Studies     Patient Profile       Assessment & Plan    1 CAD :      Pt presents with CP / left arm pain, nausea and vomitting. Went to the ED , found to have + troponins   Transferred to [facility] for cath  Pain free at present. We discussed cath and possible PCI. Discussed risks, benefits, options.   She understands and agrees to proceed   2. Paroxysmal atrial fib:   Currently in NSR   3. PVD - hx of carotid artery dissection   4. Essential HTN:   On coreg 3.125 bid, lisinopril 40 a day  May need addition of amlodipine or perhaps changing lisinopril to an ARB   5. Hyperlipidemia :   On atorva 80, fenofibrate 160 a day   Signed, Mertie Moores, MD  01/22/2016, 9:31 AM

## 2016-01-22 NOTE — Progress Notes (Addendum)
ANTICOAGULATION CONSULT NOTE - Follow Up Consult  Pharmacy Consult for Heparin Indication: chest pain/ACS and NSTEMI  Allergies  Allergen Reactions  . Tape Other (See Comments)    Causes blisters to form  . Latex Rash    Blisters  . Tape Rash    Patient Measurements: Height: 5\' 3"  (160 cm) Weight: 194 lb 4.8 oz (88.1 kg) IBW/kg (Calculated) : 52.4 Heparin Dosing Weight:   Vital Signs: Temp: 98.3 F (36.8 C) (11/08 0747) Temp Source: Oral (11/08 0747) BP: 159/85 (11/08 0747) Pulse Rate: 88 (11/08 0747)  Labs:  Recent Labs  01/21/16 1331 01/21/16 1557 01/21/16 2317 01/22/16 0454  HGB 15.3*  --   --  14.4  HCT 45.9  --   --  43.6  PLT 377  --   --  350  LABPROT 13.9  --   --   --   INR 1.07  --   --   --   HEPARINUNFRC  --   --  0.18* 0.29*  CREATININE 0.81  --   --   --   TROPONINI 1.57* 2.03*  --   --     Estimated Creatinine Clearance: 71.9 mL/min (by C-G formula based on SCr of 0.81 mg/dL).   Medications:  Scheduled:  . sodium chloride   Intravenous STAT  . atorvastatin  80 mg Oral q1800  . carvedilol  3.125 mg Oral BID WC  . clorazepate  7.5 mg Oral TID  . estradiol  1 mg Oral Daily  . fenofibrate  160 mg Oral Daily  . hydrALAZINE  100 mg Oral BID  . levothyroxine  50 mcg Oral QAC breakfast  . lisinopril  40 mg Oral Daily  . pantoprazole  40 mg Oral Daily  . sodium chloride flush  3 mL Intravenous Q12H    Assessment: 65yo female with NSTEMI, for cath today.  Heparin level drawn 3hr early this AM, after bolus & rate increase overnight, is just below goal.  Hg and pltc wnl.  No bleeding noted.  WIll have to repeat heparin level.  Goal of Therapy:  INR 2-3 Monitor platelets by anticoagulation protocol: Yes   Plan:  Continue heparin at current rate Repeat heparin level at 11AM F/U after cath  Gracy Bruins, [facility]   01/22/16  Addendum 1235  Heparin level < 0.1 on 1000  units/hr  A/P:  Heparin level subtherapeutic.  Per d/w RN, IV has been alarming frequently due to IV site.  Pt is to go to cath soon, but they have not called yet.  1-  Increase heparin to 1150 units/hr 2-  F/U after cath  Gracy Bruins, PharmD Clinical Pharmacist [facility]

## 2016-01-22 NOTE — Progress Notes (Signed)
ANTICOAGULATION CONSULT NOTE - Follow Up Consult  Pharmacy Consult for Heparin  Indication: chest pain/ACS, NSTEMI  Allergies  Allergen Reactions  . Tape Other (See Comments)    Causes blisters to form  . Latex Rash    Blisters  . Tape Rash    Patient Measurements: Height: 5\' 3"  (160 cm) Weight: 194 lb (88 kg) IBW/kg (Calculated) : 52.4  Vital Signs: Temp: 98.4 F (36.9 C) (11/08 0030) Temp Source: Oral (11/08 0030) BP: 141/80 (11/08 0030) Pulse Rate: 92 (11/08 0030)  Labs:  Recent Labs  01/21/16 1331 01/21/16 1557 01/21/16 2317  HGB 15.3*  --   --   HCT 45.9  --   --   PLT 377  --   --   LABPROT 13.9  --   --   INR 1.07  --   --   HEPARINUNFRC  --   --  0.18*  CREATININE 0.81  --   --   TROPONINI 1.57* 2.03*  --     Estimated Creatinine Clearance: 71.8 mL/min (by C-G formula based on SCr of 0.81 mg/dL).   Assessment: Heparin for NSTEMI, cath this AM, heparin level is low, no issues per RN.   Goal of Therapy:  Heparin level 0.3-0.7 units/ml Monitor platelets by anticoagulation protocol: Yes   Plan:  -Inc heparin to 1000 units/hr -0800 HL -Cath today  Narda Bonds 01/22/2016,12:46 AM

## 2016-01-22 NOTE — Interval H&P Note (Signed)
History and Physical Interval Note:  01/22/2016 1:14 PM  [name]  has presented today for surgery, with the diagnosis of PAF, nstemi  The various methods of treatment have been discussed with the patient and family. After consideration of risks, benefits and other options for treatment, the patient has consented to  Procedure(s): Left Heart Cath and Coronary Angiography (N/A) as a surgical intervention .  The patient's history has been reviewed, patient examined, no change in status, stable for surgery.  I have reviewed the patient's chart and labs.  Questions were answered to the patient's satisfaction.    Cath Lab Visit (complete for each Cath Lab visit)  Clinical Evaluation Leading to the Procedure:   ACS: Yes.    Non-ACS:    Anginal Classification: CCS III  Anti-ischemic medical therapy: Minimal Therapy (1 class of medications)  Non-Invasive Test Results: No non-invasive testing performed  Prior CABG: No previous CABG       [name] [facility] 01/22/2016 1:14 PM

## 2016-01-22 NOTE — Progress Notes (Signed)
Patient Name: [name] Date of Encounter: 01/22/2016  Primary Cardiologist: [facility] Problem [facility]     Active Problems:   NSTEMI (non-ST elevation myocardial infarction) [facility])     Subjective   65 yo female, hx of atrial fib, bradycardia, grade 2 diastolic dysfunciton admitted with CP / NSTEMI   I have seen her in the past,  She has transferred her care to Dr. Harl Bowie due to location of the office and trnasportation issues.    Inpatient Medications    Scheduled Meds: . sodium chloride   Intravenous STAT  . atorvastatin  80 mg Oral q1800  . carvedilol  3.125 mg Oral BID WC  . clorazepate  7.5 mg Oral TID  . estradiol  1 mg Oral Daily  . fenofibrate  160 mg Oral Daily  . hydrALAZINE  100 mg Oral BID  . levothyroxine  50 mcg Oral QAC breakfast  . lisinopril  40 mg Oral Daily  . pantoprazole  40 mg Oral Daily  . sodium chloride flush  3 mL Intravenous Q12H   Continuous Infusions: . sodium chloride 1 mL/kg/hr (01/22/16 0714)  . heparin 1,000 Units/hr (01/22/16 0100)  . nitroGLYCERIN     PRN Meds: sodium chloride, albuterol, guaiFENesin-codeine, morphine injection, nitroGLYCERIN, potassium chloride SA, sodium chloride flush   Vital Signs    Vitals:   01/21/16 2222 01/22/16 0030 01/22/16 0429 01/22/16 0747  BP: (!) 141/83 (!) 141/80 (!) 153/98 (!) 159/85  Pulse:  92 100 88  Resp: (!) 21  19 15   Temp:  98.4 F (36.9 C) 98.4 F (36.9 C) 98.3 F (36.8 C)  TempSrc:  Oral Oral Oral  SpO2:  95% 97% 97%  Weight:   194 lb 4.8 oz (88.1 kg)   Height:        Intake/Output Summary (Last 24 hours) at 01/22/16 0931 Last data filed at 01/22/16 0200  Gross per 24 hour  Intake            88.48 ml  Output                0 ml  Net            88.48 ml   Filed Weights   01/21/16 1310 01/22/16 0429  Weight: 194 lb (88 kg) 194 lb 4.8 oz (88.1 kg)    Physical Exam    GEN: Well nourished, well developed, in no acute distress.  HEENT: Grossly normal.    Neck: Supple, no JVD, carotid bruits, or masses. Cardiac: RRR, no murmurs, rubs, or gallops. No clubbing, cyanosis, edema.  Radials/DP/PT 2+ and equal bilaterally.  Respiratory:  Respirations regular and unlabored, clear to auscultation bilaterally. GI: Soft, nontender, nondistended, BS + x 4. MS: no deformity or atrophy. Skin: warm and dry, no rash. Neuro:  Strength and sensation are intact. Psych: AAOx3.  Normal affect.  Labs    CBC  Recent Labs  01/21/16 1331 01/22/16 0454  WBC 11.6* 9.9  HGB 15.3* 14.4  HCT 45.9 43.6  MCV 86.4 87.0  PLT 377 [id number]   Basic Metabolic Panel  Recent Labs  01/21/16 1331  NA 135  K 3.5  CL 103  CO2 26  GLUCOSE 145*  BUN 13  CREATININE 0.81  CALCIUM 9.3   Liver Function Tests No results for input(s): AST, ALT, ALKPHOS, BILITOT, PROT, ALBUMIN in the last 72 hours. No results for input(s): LIPASE, AMYLASE in the last 72 hours. Cardiac Enzymes  Recent Labs  01/21/16 1331 01/21/16 1557  TROPONINI 1.57* 2.03*   BNP Invalid input(s): POCBNP D-Dimer No results for input(s): DDIMER in the last 72 hours. Hemoglobin A1C No results for input(s): HGBA1C in the last 72 hours. Fasting Lipid Panel No results for input(s): CHOL, HDL, LDLCALC, TRIG, CHOLHDL, LDLDIRECT in the last 72 hours. Thyroid Function Tests No results for input(s): TSH, T4TOTAL, T3FREE, THYROIDAB in the last 72 hours.  Invalid input(s): FREET3  Telemetry    NSR  - Personally Reviewed  ECG    Sinus tach at 104.   NS ST abn.  - Personally Reviewed  Radiology    Dg Chest 2 View  Result Date: 01/21/2016 CLINICAL DATA:  Left arm pain for 2 days, shortness of breath EXAM: CHEST  2 VIEW COMPARISON:  A 2016 FINDINGS: The heart size and mediastinal contours are within normal limits. Both lungs are clear. The visualized skeletal structures are unremarkable. IMPRESSION: No active cardiopulmonary disease. Electronically Signed   By: Kathreen Devoid   On: 01/21/2016 13:34     Cardiac Studies     Patient Profile       Assessment & Plan    1 CAD :      Pt presents with CP / left arm pain, nausea and vomitting. Went to the ED , found to have + troponins   Transferred to [facility] for cath  Pain free at present. We discussed cath and possible PCI. Discussed risks, benefits, options.   She understands and agrees to proceed   2. Paroxysmal atrial fib:   Currently in NSR   3. PVD - hx of carotid artery dissection   4. Essential HTN:   On coreg 3.125 bid, lisinopril 40 a day  May need addition of amlodipine or perhaps changing lisinopril to an ARB   5. Hyperlipidemia :   On atorva 80, fenofibrate 160 a day   Signed, Mertie Moores, MD  01/22/2016, 9:31 AM

## 2016-01-23 ENCOUNTER — Inpatient Hospital Stay (HOSPITAL_COMMUNITY): Payer: Medicare Other

## 2016-01-23 ENCOUNTER — Encounter (HOSPITAL_COMMUNITY): Payer: Self-pay | Admitting: Cardiology

## 2016-01-23 DIAGNOSIS — I10 Essential (primary) hypertension: Secondary | ICD-10-CM

## 2016-01-23 DIAGNOSIS — I5032 Chronic diastolic (congestive) heart failure: Secondary | ICD-10-CM

## 2016-01-23 LAB — ECHOCARDIOGRAM COMPLETE
AO mean calculated velocity dopler: 159 cm/s
AOVTI: 40.8 cm
AV Mean grad: 12 mmHg
AV pk vel: 235 cm/s
AVPG: 22 mmHg
CHL CUP TV REG PEAK VELOCITY: 270 cm/s
E/e' ratio: 18.6
FS: 19 % — AB (ref 28–44)
HEIGHTINCHES: 63 in
IVS/LV PW RATIO, ED: 0.89
LA ID, A-P, ES: 36 mm
LA diam index: 1.88 cm/m2
LA vol A4C: 47.8 ml
LA vol: 50.9 mL
LAVOLIN: 26.6 mL/m2
LDCA: 2.84 cm2
LEFT ATRIUM END SYS DIAM: 36 mm
LV E/e'average: 18.6
LV PW d: 12.1 mm — AB (ref 0.6–1.1)
LV TDI E'LATERAL: 5
LVEEMED: 18.6
LVELAT: 5 cm/s
LVOTD: 19 mm
Lateral S' vel: 15.3 cm/s
MV Peak grad: 3 mmHg
MV pk A vel: 144 m/s
MVPKEVEL: 93 m/s
RV sys press: 32 mmHg
TAPSE: 23.5 mm
TDI e' medial: 5.77
TR max vel: 270 cm/s
WEIGHTICAEL: 3108.8 [oz_av]

## 2016-01-23 LAB — BASIC METABOLIC PANEL
Anion gap: 7 (ref 5–15)
BUN: 10 mg/dL (ref 6–20)
CO2: 22 mmol/L (ref 22–32)
CREATININE: 0.53 mg/dL (ref 0.44–1.00)
Calcium: 9.3 mg/dL (ref 8.9–10.3)
Chloride: 108 mmol/L (ref 101–111)
GFR calc Af Amer: >60 mL/min (ref >60)
Glucose, Bld: 146 mg/dL — ABNORMAL HIGH (ref 65–99)
Potassium: 3.8 mmol/L (ref 3.5–5.1)
SODIUM: 137 mmol/L (ref 135–145)

## 2016-01-23 LAB — CBC
HCT: 39.4 % (ref 36.0–46.0)
Hemoglobin: 12.9 g/dL (ref 12.0–15.0)
MCH: 28.4 pg (ref 26.0–34.0)
MCHC: 32.7 g/dL (ref 30.0–36.0)
MCV: 86.6 fL (ref 78.0–100.0)
PLATELETS: 321 10*3/uL (ref 150–400)
RBC: 4.55 MIL/uL (ref 3.87–5.11)
RDW: 13.4 % (ref 11.5–15.5)
WBC: 10.9 10*3/uL — ABNORMAL HIGH (ref 4.0–10.5)

## 2016-01-23 MED ORDER — AMLODIPINE BESYLATE 5 MG PO TABS
5.0000 mg | ORAL_TABLET | Freq: Every day | ORAL | Status: DC
  Administered 2016-01-23 – 2016-01-24 (×2): 5 mg via ORAL
  Filled 2016-01-23 (×2): qty 1

## 2016-01-23 MED FILL — Heparin Sodium (Porcine) 2 Unit/ML in Sodium Chloride 0.9%: INTRAMUSCULAR | Qty: 500 | Status: AC

## 2016-01-23 NOTE — Progress Notes (Signed)
  Echocardiogram 2D Echocardiogram has been performed.  [name] 01/23/2016, 12:21 PM

## 2016-01-23 NOTE — Progress Notes (Signed)
CARDIAC REHAB PHASE I   PRE:  Rate/Rhythm: 95 SR  BP:  Supine:   Sitting: 146/86  Standing:    SaO2: 94%RA  MODE:  Ambulation: 120 ft   POST:  Rate/Rhythm: 100 SR  BP:  Supine:   Sitting: 153/73  Standing:    SaO2: 95%RA 1000-1100 Pt walked 120 ft on RA with asst x 1. Head ache worsened with activity but no CP. To recliner after walk. Pt stated that since her CVA that her walking has been limited. MI education completed with pt who voiced understanding. Pt stated she does not read, so I turned pages down in MI booklet of what we discussed so her children can read and re enforce with her.. Also there are written ex ed and low sodium diet handouts. Pt has brilinta booklet and I stressed importance of brilinta with stent. Reviewed NTG use, MI restrictions, low sodium and heart healthy diets, modified ex ed. Pt does not have scales at home to weigh. Encouraged her to notify MD if she notices more swelling in feet or belly or SOB. Discussed 2000 mg sodium restriction.   Graylon Good, RN BSN  01/23/2016 10:56 AM

## 2016-01-23 NOTE — Progress Notes (Signed)
Hospital Problem List     Active Problems:   NSTEMI (non-ST elevation myocardial infarction) [facility])     Patient Profile:   65 yo female, hx of atrial fib, bradycardia, grade 2 diastolic dysfunciton admitted with CP / NSTEMI.   Subjective   No events overnight. Having a headache this morning. No further chest pain. Reports some SOB but believes it secondary to her COPD.   Inpatient Medications    . aspirin  81 mg Oral Daily  . atorvastatin  80 mg Oral q1800  . carvedilol  25 mg Oral BID WC  . clorazepate  7.5 mg Oral TID  . estradiol  1 mg Oral Daily  . fenofibrate  160 mg Oral Daily  . hydrALAZINE  100 mg Oral BID  . levothyroxine  50 mcg Oral QAC breakfast  . lisinopril  40 mg Oral Daily  . pantoprazole  40 mg Oral Daily  . sodium chloride flush  3 mL Intravenous Q12H  . ticagrelor  90 mg Oral BID    Vital Signs    Vitals:   01/23/16 0000 01/23/16 0100 01/23/16 0349 01/23/16 0400  BP:   135/69   Pulse: 89 88 93 90  Resp: (!) 23 20 (!) 21 20  Temp:   98.6 F (37 C)   TempSrc:   Oral   SpO2: 93% 92% 95% 94%  Weight:      Height:        Intake/Output Summary (Last 24 hours) at 01/23/16 0649 Last data filed at 01/23/16 0600  Gross per 24 hour  Intake           1041.8 ml  Output                0 ml  Net           1041.8 ml   Filed Weights   01/21/16 1310 01/22/16 0429  Weight: 194 lb (88 kg) 194 lb 4.8 oz (88.1 kg)    Physical Exam   GEN: Well nourished, well developed, in no acute distress.  HEENT: Grossly normal.  Neck: Supple, no JVD, carotid bruits, or masses. Cardiac: RRR, no murmurs, rubs, or gallops. No clubbing, cyanosis, edema.  Radials/DP/PT 2+ and equal bilaterally.  Respiratory:  Respirations regular and unlabored, clear to auscultation bilaterally. GI: Soft, nontender, nondistended, BS + x 4. MS: no deformity or atrophy. Skin: warm and dry, no rash. Neuro:  Strength and sensation are intact. Psych: AAOx3.  Normal affect.  Labs      CBC  Recent Labs  01/22/16 0454 01/23/16 0313  WBC 9.9 10.9*  HGB 14.4 12.9  HCT 43.6 39.4  MCV 87.0 86.6  PLT 350 [id number]   Basic Metabolic Panel  Recent Labs  01/21/16 1331 01/23/16 0313  NA 135 137  K 3.5 3.8  CL 103 108  CO2 26 22  GLUCOSE 145* 146*  BUN 13 10  CREATININE 0.81 0.53  CALCIUM 9.3 9.3   Liver Function Tests No results for input(s): AST, ALT, ALKPHOS, BILITOT, PROT, ALBUMIN in the last 72 hours. No results for input(s): LIPASE, AMYLASE in the last 72 hours. Cardiac Enzymes  Recent Labs  01/21/16 1331 01/21/16 1557 01/22/16 2029  TROPONINI 1.57* 2.03* 2.12*    Telemetry    NSR  ECG    NSR.   NS ST abn.   Cardiac Studies and Radiology    Dg Chest 2 View  Result Date: 01/21/2016 CLINICAL DATA:  Left arm pain for  2 days, shortness of breath EXAM: CHEST  2 VIEW COMPARISON:  A 2016 FINDINGS: The heart size and mediastinal contours are within normal limits. Both lungs are clear. The visualized skeletal structures are unremarkable. IMPRESSION: No active cardiopulmonary disease. Electronically Signed   By: Kathreen Devoid   On: 01/21/2016 13:34   Mm Screening Breast Tomo Bilateral  Result Date: 01/17/2016 CLINICAL DATA:  Screening. EXAM: 2D DIGITAL SCREENING BILATERAL MAMMOGRAM WITH CAD AND ADJUNCT TOMO COMPARISON:  Previous exam(s). ACR Breast Density Category c: The breast tissue is heterogeneously dense, which may obscure small masses. FINDINGS: There are no findings suspicious for malignancy. Images were processed with CAD. IMPRESSION: No mammographic evidence of malignancy. A result letter of this screening mammogram will be mailed directly to the patient. RECOMMENDATION: Screening mammogram in one year. (Code:SM-B-01Y) BI-RADS CATEGORY  1: Negative. Electronically Signed   By: Lajean Manes M.D.   On: 01/17/2016 13:16    Echocardiogram: pending   Assessment & Plan    1 CAD : Pt presented with CP / left arm pain, nausea and vomitting. Went to the  ED , found to have + troponins. Underwent LHC 11/8. Showed severe 2 vessel obstructive CAD. Successful stenting of the first OM with DES. Appeared to be the culprit vessel. Unsuccessful PCI of the RCA due to wire induced dissection of the vessel. Remains pain free after procedure despite dissection. On nitro gtt overnight.  -DAPT, Brilenta 90 mg daily and ASA -Coreg 25 mg bid -Lisinopril 40 mg daily -Will stop Nitro gtt this morning  2. Paroxysmal atrial fib:   Currently in NSR   3. PVD - hx of carotid artery dissection   4. Essential HTN:   On coreg 3.125 bid, lisinopril 40 a day. BP elevated this morning in the [id number] systolic. Will add amlodipine 5 mg this morning. Perhaps changing lisinopril to an ARB if pressures continue to be uncontrolled.  5. Hyperlipidemia :  On atorva 80, fenofibrate 160 a day   Anson Crofts PGY2 IM Resident [phone] 6:49 AM 01/23/2016   Attending Note:   The patient was seen and examined.  Agree with assessment and plan as noted above.  Changes made to the above note as needed.  Patient seen and independently examined with Maryellen Pile, PGY2 .   We discussed all aspects of the encounter. I agree with the assessment and plan as stated above.  1. CAD :   Very stable. Has a wire dissection in the RCA but no symptoms associated with that Improved significantly after PCI of her OM  Will keep her 1 more day  Transfer to tele. Ambulate.  DC NTG drip   2. HTN:   Restart meds.  Titrate as needed  May add amlodipine    I have spent a total of 40 minutes with patient reviewing hospital  notes , telemetry, EKGs, labs and examining patient as well as establishing an assessment and plan that was discussed with the patient. > 50% of time was spent in direct patient care.    Thayer Headings, Brooke Bonito., MD, [facility] 01/23/2016, 8:55 AM [address],  [place] Pager [phone]

## 2016-01-23 NOTE — Care Management Note (Signed)
Case Management Note  Patient Details  Name: [name] MRN: [id number] Date of Birth: [birth year]  Subjective/Objective:    Adm w nstemi                Action/Plan: will be on brilinta for at least 1 year. Gave pt 30day free brilinta card. Pt states has Clinical cytogeneticist and medicaid for meds.   Expected Discharge Date:                  Expected Discharge Plan:  Home/Self Care  In-House Referral:     Discharge planning Services  CM Consult, Medication Assistance  Post Acute Care Choice:    Choice offered to:     DME Arranged:    DME Agency:     HH Arranged:    HH Agency:     Status of Service:  Completed, signed off  If discussed at [employer] of Stay Meetings, dates discussed:    Additional Comments: lives w fam, pcp dr Radford Pax, Wendi Maya, RN 01/23/2016, 10:15 AM

## 2016-01-24 ENCOUNTER — Telehealth: Payer: Self-pay | Admitting: Cardiovascular Disease

## 2016-01-24 LAB — CBC
HCT: 39.8 % (ref 36.0–46.0)
HEMOGLOBIN: 13 g/dL (ref 12.0–15.0)
MCH: 28.1 pg (ref 26.0–34.0)
MCHC: 32.7 g/dL (ref 30.0–36.0)
MCV: 86 fL (ref 78.0–100.0)
Platelets: 342 10*3/uL (ref 150–400)
RBC: 4.63 MIL/uL (ref 3.87–5.11)
RDW: 13.4 % (ref 11.5–15.5)
WBC: 11.1 10*3/uL — ABNORMAL HIGH (ref 4.0–10.5)

## 2016-01-24 MED ORDER — ATORVASTATIN CALCIUM 80 MG PO TABS: 80.0000 mg | ORAL_TABLET | Freq: Every day | ORAL | 12 refills | Status: DC

## 2016-01-24 MED ORDER — TICAGRELOR 90 MG PO TABS: 90.0000 mg | ORAL_TABLET | Freq: Two times a day (BID) | ORAL | 0 refills | Status: DC

## 2016-01-24 MED ORDER — CARVEDILOL 25 MG PO TABS: 25.0000 mg | ORAL_TABLET | Freq: Two times a day (BID) | ORAL | 12 refills | Status: DC

## 2016-01-24 MED ORDER — TICAGRELOR 90 MG PO TABS: 90.0000 mg | ORAL_TABLET | Freq: Two times a day (BID) | ORAL | 11 refills | Status: DC

## 2016-01-24 MED ORDER — AMLODIPINE BESYLATE 5 MG PO TABS: 5.0000 mg | ORAL_TABLET | Freq: Every day | ORAL | 12 refills | Status: DC

## 2016-01-24 MED ORDER — ASPIRIN EC 81 MG PO TBEC: 81.0000 mg | DELAYED_RELEASE_TABLET | Freq: Every day | ORAL | Status: DC

## 2016-01-24 NOTE — Progress Notes (Addendum)
Patient c/o 9/10 frontal headache.  Norco 1 tablet given @ 0357. It was effective. She is currently in bed with eyes closed. Will continue to monitor.

## 2016-01-24 NOTE — Care Management Important Message (Signed)
Important Message  Patient Details  Name: [name] MRN: [id number] Date of Birth: March 15, 1951   Medicare Important Message Given:  Yes    Naomi Fitton Abena 01/24/2016, 10:58 AM

## 2016-01-24 NOTE — Progress Notes (Signed)
CARDIAC REHAB PHASE I   PRE:  Rate/Rhythm: 85 SR  BP:  Supine: 137/72  Sitting:   Standing:    SaO2:   MODE:  Ambulation: 200 ft   POST:  Rate/Rhythm: 98 SR  BP:  Supine:   Sitting: 144/77  Standing:    SaO2:  1005-1030 Pt walked 200 ft on RA holding to rail occasionally. Denied CP. Stated she had already walked earlier. Has brilinta card in purse.   Graylon Good, RN BSN  01/24/2016 10:26 AM

## 2016-01-24 NOTE — Discharge Summary (Signed)
Discharge Summary    Patient ID: [name],  MRN: [id number], DOB/AGE: [birth year] 65 y.o.  Admit date: 01/21/2016 Discharge date: 01/24/2016  Primary Care Provider: Robert Bellow Primary Cardiologist: Dr. Acie Fredrickson  Discharge Diagnoses    Active Problems:   NSTEMI (non-ST elevation myocardial infarction) [facility])   Allergies Allergies  Allergen Reactions  . Tape Other (See Comments)    Causes blisters to form  . Latex Rash    Blisters  . Tape Rash    Diagnostic Studies/Procedures    Coronary Stent Intervention  Left Heart Cath and Coronary Angiography   The left ventricular systolic function is normal.  LV end diastolic pressure is normal.  The left ventricular ejection fraction is 55-65% by visual estimate.  Prox RCA lesion, 90 %stenosed.  1st Diag lesion, 60 %stenosed.  Prox LAD to Mid LAD lesion, 20 %stenosed.  2nd Diag lesion, 40 %stenosed.  Ost Ramus to Ramus lesion, 50 %stenosed.  1st Mrg lesion, 99 %stenosed.  A STENT SYNERGY DES 2.75X20 drug eluting stent was successfully placed.  Post intervention, there is a 0% residual stenosis.  Ost RCA to Prox RCA lesion, 90 %stenosed.  Post intervention, there is a 90% residual stenosis with dissection from the proximal to mid RCA.   1. Severe 2 vessel obstructive CAD 2. Normal LV function and LV EDP 3. Successful stenting of the first OM with DES. This appears to be the culprit vessel. 4. Unsuccessful PCI of the RCA due to wire induced dissection of the vessel.   Plan: DAPT for one year. Aggressive BP control. Will continue IV Ntg overnight. Despite RCA dissection patient is pain free, hemodynamically stable and has a normal Ecg. If her symptoms remain stable I would treat medically and allow the RCA to heal. If she continues to have angina attempt at PCI of the RCA could be considered once the vessel has had time to heal - 6-8 weeks.   _____________   History of Present Illness     Ms.  [name] is a 65 year old female with a past medical history of chronic diastolic CHF, bradycardia (resolved), carotid artery stenosis, and CVA. She presented to the ED with chest pain/NSTEMI.   Initial troponin was 1.57. She was transferred to [facility] from [facility] for heart catheterization.   Hospital Course  Full cath report above. She had severe 2 vessel obstructive CAD, with a 90% proximal RCA lesion, and a 99% stenosed 1st marginal lesion.   The OM was successfully stented with a DES, which appeared to be the culprit vessel. PCI to the RCA was unsuccessful due to wire induced dissection. She was pain free after dissection and had no EKG changes.   She will be on DAPT for one year with ASA and Brilinta. Plan is to let her RCA heal and if she has recurring symptoms the RCA can be intervened upon after 6-8 weeks.   She has ambulated with cardiac rehab and did well.  Her Echo showed preserved EF of 65-70%. She was seen today by Dr. Acie Fredrickson and deemed suitable for discharge.  _____________  Discharge Vitals Blood pressure (!) 153/71, pulse 91, temperature 98.7 F (37.1 C), resp. rate 20, height 5\' 3"  (1.6 m), weight 195 lb 1.6 oz (88.5 kg), SpO2 97 %.  Filed Weights   01/22/16 0429 01/23/16 2030 01/24/16 0500  Weight: 194 lb 4.8 oz (88.1 kg) 196 lb 3.2 oz (89 kg) 195 lb 1.6 oz (88.5 kg)  Physical Exam   [id number] nourished, well developed, in no acute distress.  HEENT:Grossly normal.  Neck:Supple, no JVD, carotid bruits, or masses. Cardiac:RRR, no murmurs, rubs, or gallops. No clubbing, cyanosis, edema. Radials/DP/PT 2+ and equal bilaterally.  Respiratory:Respirations regular and unlabored, clear to auscultation bilaterally. [id number], nontender, nondistended, BS + x 4. MS:no deformity or atrophy. Skin:warm and dry, no rash. Neuro:Strength and sensation are intact. Psych:AAOx3. Normal affect.  Labs & Radiologic Studies     CBC  Recent Labs  01/23/16 0313  01/24/16 0335  WBC 10.9* 11.1*  HGB 12.9 13.0  HCT 39.4 39.8  MCV 86.6 86.0  PLT 321 [id number]   Basic Metabolic Panel  Recent Labs  01/21/16 1331 01/23/16 0313  NA 135 137  K 3.5 3.8  CL 103 108  CO2 26 22  GLUCOSE 145* 146*  BUN 13 10  CREATININE 0.81 0.53  CALCIUM 9.3 9.3   Cardiac Enzymes  Recent Labs  01/21/16 1331 01/21/16 1557 01/22/16 2029  TROPONINI 1.57* 2.03* 2.12*    Dg Chest 2 View  Result Date: 01/21/2016 CLINICAL DATA:  Left arm pain for 2 days, shortness of breath EXAM: CHEST  2 VIEW COMPARISON:  A 2016 FINDINGS: The heart size and mediastinal contours are within normal limits. Both lungs are clear. The visualized skeletal structures are unremarkable. IMPRESSION: No active cardiopulmonary disease. Electronically Signed   By: [name]   On: 01/21/2016 13:34   Mm Screening Breast Tomo Bilateral  Result Date: 01/17/2016 CLINICAL DATA:  Screening. EXAM: 2D DIGITAL SCREENING BILATERAL MAMMOGRAM WITH CAD AND ADJUNCT TOMO COMPARISON:  Previous exam(s). ACR Breast Density Category c: The breast tissue is heterogeneously dense, which may obscure small masses. FINDINGS: There are no findings suspicious for malignancy. Images were processed with CAD. IMPRESSION: No mammographic evidence of malignancy. A result letter of this screening mammogram will be mailed directly to the patient. RECOMMENDATION: Screening mammogram in one year. (Code:SM-B-01Y) BI-RADS CATEGORY  1: Negative. Electronically Signed   By: Lajean Manes M.D.   On: 01/17/2016 13:16    Disposition   Pt is being discharged home today in good condition.  Follow-up Plans & Appointments     Discharge Instructions    AMB Referral to Cardiac Rehabilitation - Phase II    Complete by:  As directed    Diagnosis:  NSTEMI   Amb Referral to Cardiac Rehabilitation    Complete by:  As directed    Diagnosis:   NSTEMI Coronary Stents        Discharge Medications   Current Discharge Medication List      CONTINUE these medications which have NOT CHANGED   Details  atorvastatin (LIPITOR) 20 MG tablet Take 20 mg by mouth every evening.     carvedilol (COREG) 3.125 MG tablet Take 1 tablet (3.125 mg total) by mouth 2 (two) times daily with a meal. Qty: 60 tablet, Refills: 3    cetirizine (ZYRTEC) 10 MG tablet Take 10 mg by mouth daily.    clorazepate (TRANXENE) 7.5 MG tablet Take 1 tablet by mouth 3 (three) times daily.    estradiol (ESTRACE) 1 MG tablet Take 1 mg by mouth daily.    fenofibrate (TRICOR) 145 MG tablet Take 145 mg by mouth every morning.     furosemide (LASIX) 40 MG tablet Take 40 mg by mouth daily as needed for fluid.     hydrALAZINE (APRESOLINE) 100 MG tablet Take 1 tablet (100 mg total) by mouth 2 (two) times daily. Qty: 90 tablet,  Refills: 3    HYDROcodone-acetaminophen (NORCO) 10-325 MG tablet Take 1 tablet by mouth 4 (four) times daily as needed for moderate pain (Lower back).    levothyroxine (SYNTHROID, LEVOTHROID) 50 MCG tablet Take 50 mcg by mouth daily before breakfast.     lisinopril (PRINIVIL,ZESTRIL) 40 MG tablet Take 1 tablet (40 mg total) by mouth daily. Qty: 30 tablet, Refills: 3    nitroGLYCERIN (NITROSTAT) 0.4 MG SL tablet Place 0.4 mg under the tongue every 5 (five) minutes as needed.      pantoprazole (PROTONIX) 40 MG tablet Take 40 mg by mouth daily.    potassium chloride SA (K-DUR,KLOR-CON) 20 MEQ tablet Take 20 mEq by mouth daily as needed (when Lasix 40mg  is taken).     PROAIR HFA 108 (90 Base) MCG/ACT inhaler Inhale 1 puff into the lungs every 6 (six) hours as needed for wheezing or shortness of breath.     senna (SENOKOT) 8.6 MG tablet Take 1 tablet by mouth daily.    temazepam (RESTORIL) 30 MG capsule Take 30 mg by mouth at bedtime as needed for sleep.    albuterol (PROVENTIL) (2.5 MG/3ML) 0.083% nebulizer solution Take 2.5 mg by nebulization every 6 (six) hours as needed for wheezing or shortness of breath.     HYDROcodone-acetaminophen (NORCO) 5-325 MG tablet Take 1 tablet by mouth every 4 (four) hours as needed. Qty: 10 tablet, Refills: 0         Aspirin prescribed at discharge?  Yes High Intensity Statin Prescribed? (Lipitor 40-80mg  or Crestor 20-40mg ): Yes Beta Blocker Prescribed? Yes For EF 40% or less, Was ACEI/ARB Prescribed? Yes ADP Receptor Inhibitor Prescribed? (i.e. Plavix etc.-Includes Medically Managed Patients): Yes For EF <40%, Aldosterone Inhibitor Prescribed? No: EF normal  Was EF assessed during THIS hospitalization? Yes Was Cardiac Rehab II ordered? (Included Medically managed Patients): Yes   Outstanding Labs/Studies     Duration of Discharge Encounter   Greater than 30 minutes including physician time.  Signed, Arbutus Leas NP 01/24/2016, 9:49 AM   Attending Note:   The patient was seen and examined.  Agree with assessment and plan as noted above.  Changes made to the above note as needed.  Patient seen and independently examined with Jettie Booze, NP .   We discussed all aspects of the encounter. I agree with the assessment and plan as stated above.  1. CAD -  S/p  PCI of OM . Also attempted PCI of RCA - had a wire dissection. Completely asymptomatic Doing well  Continue medical therapy   DC to home today  Will follow up with Nicki Reaper and then she may see me in the office or go to the [place] / [place] office if she would like   I have spent a total of 40 minutes with patient reviewing hospital  notes , telemetry, EKGs, labs and examining patient as well as establishing an assessment and plan that was discussed with the patient. > 50% of time was spent in direct patient care.    Thayer Headings, Brooke Bonito., MD, [facility] 01/24/2016, 11:53 AM [address],  [place] Pager [phone]

## 2016-01-24 NOTE — Telephone Encounter (Signed)
TOC Phone Call . Appt on 02/03/16 at ;45am w/ Richardson Dopp .Marland Kitchen Thanks

## 2016-01-27 NOTE — Telephone Encounter (Signed)
Returned call to patient. Discharge instructions list Lipitor 20mg  daily but 80mg  strength was sent to the pharmacy. She should be on the 80mg  dose given her NSTEMI and ASCVD. Advised her to start taking 80mg  dose today. Of note - she has not had a lipid panel checked in the last 5 years.  Pt also picked up carvedilol 25mg  BID even though discharge instructions stated 3.125mg  BID. She has still been taking 3.125mg  BID. She checks her BP and HR at home and BP has been ranging 130-140s/70-80s with HR in the 70-90s. She has been feeling a little fatigued as well. Do not want her to increase her carvedilol all the way to 25mg  dose today due to tolerability and sx of fatigue already. Advised her to cut her carvedilol 25mg  dose in half and take 12.5mg  BID until she sees Lyda Jester on Friday for her TCM visit. Can up titrate to 25mg  dose at that time if pt tolerating the 12.5mg  BID dose well and vitals are stable.  Pt verbalized understanding of plan. Will route note to Brittainy as an [place].

## 2016-01-27 NOTE — Telephone Encounter (Signed)
Patient contacted regarding discharge from [facility] on 01/24/2016.  Patient understands to follow up with provider Ellen Henri, PA-C on 02/03/2016 at 1400 at [facility] office.  Patient understands discharge instructions? Yes  Patient understands medications and regiment? No. Pt asked why she had 2 different doses of Lipitor(20 and 80).  She states her discharge instructions say take 20 mg and Jettie Booze, PA-C sent rx for 80 mg to pharmacy.  Discharge instructions also state pt to be on high dose Lipitor therapy, but does not list dosage.  She also states she is taking Carvedilol 3.125 mg twice daily. I advised her the dose had been increased to Coreg 25 mg, rx sent to pharmacy 01/24/16. I advised her to contact [facility] and have new medication filled.  She voiced understanding and agreed with plan.  Patient understands to bring all medications to this visit? Yes  I advised patient that we would contact her to clarify Lipitor dose and if she has not rec'd clarification by the end of the day, take the atorvastatin 20mg . She voiced understanding and thanks.

## 2016-01-31 ENCOUNTER — Ambulatory Visit (INDEPENDENT_AMBULATORY_CARE_PROVIDER_SITE_OTHER): Payer: Medicare Other | Admitting: Cardiology

## 2016-01-31 ENCOUNTER — Encounter: Payer: Self-pay | Admitting: Cardiology

## 2016-01-31 VITALS — BP 134/78 | HR 72 | Ht 63.0 in | Wt 197.8 lb

## 2016-01-31 DIAGNOSIS — Z9861 Coronary angioplasty status: Secondary | ICD-10-CM | POA: Diagnosis not present

## 2016-01-31 DIAGNOSIS — I10 Essential (primary) hypertension: Secondary | ICD-10-CM | POA: Diagnosis not present

## 2016-01-31 DIAGNOSIS — I2510 Atherosclerotic heart disease of native coronary artery without angina pectoris: Secondary | ICD-10-CM | POA: Diagnosis not present

## 2016-01-31 NOTE — Patient Instructions (Signed)
Your physician recommends that you continue on your current medications as directed. Please refer to the Current Medication list given to you today. Your physician recommends that you schedule a follow-up appointment in: [place] in 4 weeks with Dr. Harl Bowie.

## 2016-01-31 NOTE — Progress Notes (Signed)
01/31/2016 IRISH KUPKA   [birth year]  MI:[id number]  Primary Physician Robert Bellow, MD Primary Cardiologist: Dr. Harl Bowie    Reason for Visit/CC: [facility] f/u for CAD / NSTEMI  HPI:  Ms. Zaccheo presents to clinic today for post hospital f/u after recent admission for NSTEMI. She  is a 65 year old female with a past medical history of chronic diastolic CHF, bradycardia (resolved), carotid artery stenosis, and CVA. She presented to the ED on 01/21/16 with left arm pain/NSTEMI. Initial troponin was 1.57. She was transferred to [facility] from [facility] for heart catheterization. She had severe 2 vessel obstructive CAD, with a 90% proximal RCA lesion, and a 99% stenosed 1st marginal lesion. The OM was successfully stented with a DES, which appeared to be the culprit vessel. PCI to the RCA was unsuccessful due to wire induced dissection. She was pain free after dissection and had no EKG changes. She was placed on DAPT  with ASA and Brilinta. Plan is to let her RCA heal and if she has recurring symptoms, the RCA can be intervened upon after 6-8 weeks.  Her Echo showed preserved EF of 65-70%. She had no recurrent CP. She was last seen by Dr. Acie Fredrickson who deemed suitable for discharge.   Today in f/u, she reports that she has done well. She denies any recurrent left arm pain. No CP or dyspnea. She has been fully compliant with her meds. No abnormal bleeding. Her only issues has been fatigue. She was discharged home initially on 25 mg of Coreg BID. She called the office noting fatigue and was instructed to reduce her Coreg down to 12.5 mg BID. She still feels tired.   Current Meds  Medication Sig  . albuterol (PROVENTIL) (2.5 MG/3ML) 0.083% nebulizer solution Take 2.5 mg by nebulization every 6 (six) hours as needed for wheezing or shortness of breath.  Marland Kitchen amLODipine (NORVASC) 5 MG tablet Take 1 tablet (5 mg total) by mouth daily.  Marland Kitchen aspirin EC 81 MG tablet Take 1 tablet (81 mg total) by mouth  daily.  Marland Kitchen atorvastatin (LIPITOR) 80 MG tablet Take 1 tablet (80 mg total) by mouth daily at 6 PM.  . carvedilol (COREG) 25 MG tablet Take 1 tablet (25 mg total) by mouth 2 (two) times daily with a meal.  . cetirizine (ZYRTEC) 10 MG tablet Take 10 mg by mouth daily.  . clorazepate (TRANXENE) 7.5 MG tablet Take 1 tablet by mouth 3 (three) times daily.  Marland Kitchen estradiol (ESTRACE) 1 MG tablet Take 1 mg by mouth daily.  . fenofibrate (TRICOR) 145 MG tablet Take 145 mg by mouth every morning.   . furosemide (LASIX) 40 MG tablet Take 40 mg by mouth daily as needed for fluid.   . hydrALAZINE (APRESOLINE) 100 MG tablet Take 1 tablet (100 mg total) by mouth 2 (two) times daily.  Marland Kitchen HYDROcodone-acetaminophen (NORCO) 10-325 MG tablet Take 1 tablet by mouth 4 (four) times daily as needed for moderate pain (Lower back).  Marland Kitchen levothyroxine (SYNTHROID, LEVOTHROID) 50 MCG tablet Take 50 mcg by mouth daily before breakfast.   . lisinopril (PRINIVIL,ZESTRIL) 40 MG tablet Take 1 tablet (40 mg total) by mouth daily.  . pantoprazole (PROTONIX) 40 MG tablet Take 40 mg by mouth daily.  Marland Kitchen PROAIR HFA 108 (90 Base) MCG/ACT inhaler Inhale 1 puff into the lungs every 6 (six) hours as needed for wheezing or shortness of breath.   . senna (SENOKOT) 8.6 MG tablet Take 1 tablet by mouth  daily.  . ticagrelor (BRILINTA) 90 MG TABS tablet Take 1 tablet (90 mg total) by mouth 2 (two) times daily.  . ticagrelor (BRILINTA) 90 MG TABS tablet Take 1 tablet (90 mg total) by mouth 2 (two) times daily.   Allergies  Allergen Reactions  . Tape Other (See Comments)    Causes blisters to form  . Latex Rash    Blisters  . Tape Rash   Past Medical History:  Diagnosis Date  . Anxiety   . Arthritis    "hands, arms, back, neck, knees, fingers" (01/21/2016)  . Atrial fibrillation (HCC)    ASA daiy; came off Coumadin shortly after stroke  . Breast cancer, left breast ([place]) 1970s; 2015  . Cancer of skin of leg    BLE  . Carotid artery  dissection (HCC)    left  . Chronic lower back pain   . Chronic pain   . COPD (chronic obstructive pulmonary disease) ([place])   . Coronary artery disease   . CVA (cerebral vascular accident) [facility]) pt denies right brain CVA   Right brain secondary to right internal carotid artery dissection for which 2 stents were placed;F/u cerebral angiography in 05/2008-minimal plaque; healing of previously identified left internal carotid artery dissection  . Depression   . GERD (gastroesophageal reflux disease)   . Heart murmur   . History of blood transfusion    "related to spleen"  . History of hiatal hernia   . History of kidney stones   . Hyperlipidemia   . Hypertension   . Hypothyroidism   . Kidney stone   . Melanoma of forearm, left ([place])   . Migraine    "a few migraines/year" (01/21/2016)  . NSTEMI (non-ST elevated myocardial infarction) ([place]) 01/21/2016   Archie Endo 01/21/2016  . Obesity   . Stroke [facility]) 04/2003   2 carotid artery stents in place; small left parietal and left hemispheric CVA.Phillis Knack 01/21/2016  . Tobacco use disorder    50 pack years; questionably discontinued in 2010  . Wears glasses    Family History  Problem Relation Age of Onset  . Heart disease    . Arthritis    . Cancer    . Diabetes    . Kidney disease    . Cancer Sister   . Heart failure Brother    Past Surgical History:  Procedure Laterality Date  . ABDOMINAL HYSTERECTOMY  1978  . APPENDECTOMY    . BREAST BIOPSY Left 1970s; 2015  . BREAST LUMPECTOMY Left 1970s  . BREAST LUMPECTOMY WITH NEEDLE LOCALIZATION Left 07/07/2013   Procedure: BREAST LUMPECTOMY WITH NEEDLE LOCALIZATION;  Surgeon: Merrie Roof, MD;  Location: [place];  Service: General;  Laterality: Left;  . CARDIAC CATHETERIZATION N/A 01/22/2016   Procedure: Left Heart Cath and Coronary Angiography;  Surgeon: Peter M Martinique, MD;  Location: [place] CV LAB;  Service: Cardiovascular;  Laterality: N/A;  . CARDIAC CATHETERIZATION  N/A 01/22/2016   Procedure: Coronary Stent Intervention;  Surgeon: Peter M Martinique, MD;  Location: [place] [facility];  Service: Cardiovascular;  Laterality: N/A;  . CAROTID STENT Left 04/2003   small left parietal and left hemispheric CVA/notes 07/29/2010  . CHOLECYSTECTOMY OPEN    . COLONOSCOPY  2011  . ESOPHAGOGASTRODUODENOSCOPY  08/06/2011   Procedure: ESOPHAGOGASTRODUODENOSCOPY (EGD);  Surgeon: Rogene Houston, MD;  [facility];  Service: Endoscopy;  Laterality: N/A;  . FLEXIBLE SIGMOIDOSCOPY  08/06/2011   Procedure: FLEXIBLE SIGMOIDOSCOPY;  Surgeon: Rogene Houston,  MD;  [facility];  Service: Endoscopy;  Laterality: N/A;  . INGUINAL HERNIA REPAIR Left 1970s  . LARYNX SURGERY  1970s   Polyps excised  . MELANOMA EXCISION Left ~ 2016   forearm  . 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   spontaneous rupture   Social History   Social History  . Marital status: Divorced    Spouse name: N/A  . Number of children: 2  . Years of education: N/A   Occupational History  . Disabled   . retired    Social History Main Topics  . Smoking status: Former Smoker    Packs/day: 1.00    Years: 40.00    Types: Cigarettes    Quit date: 07/06/2007  . Smokeless tobacco: Never Used  . Alcohol use No  . Drug use: No  . Sexual activity: Not Currently   Other Topics Concern  . Not on file   Social History Narrative   ** Merged History Encounter **         Review of Systems: General: negative for chills, fever, night sweats or weight changes.  Cardiovascular: negative for chest pain, dyspnea on exertion, edema, orthopnea, palpitations, paroxysmal nocturnal dyspnea or shortness of breath Dermatological: negative for rash Respiratory: negative for cough or wheezing Urologic: negative for hematuria Abdominal: negative for nausea, vomiting, diarrhea, bright red blood per rectum, melena, or hematemesis Neurologic: negative for visual changes, syncope, or dizziness All other systems  reviewed and are otherwise negative except as noted above.   Physical Exam:  Blood pressure 134/78, pulse 72, height 5\' 3"  (1.6 m), weight 197 lb 12.8 oz (89.7 kg), SpO2 95 %.  General appearance: alert, cooperative and no distress Neck: no carotid bruit and no JVD Lungs: clear to auscultation bilaterally Heart: regular rate and rhythm, S1, S2 normal, no murmur, click, rub or gallop Extremities: extremities normal, atraumatic, no cyanosis or edema Pulses: 2+ and symmetric Skin: Skin color, texture, turgor normal. No rashes or lesions Neurologic: Grossly normal  EKG Not preformed   ASSESSMENT AND PLAN:   1. CAD: s/p recent NSTEMI with cath showing 2V Disease with occluded OM 1 (culprit) treated with PCI + DES, also with a 90% RCA lesion.  PCI to the RCA was unsuccessful due to wire induced dissection. She was pain free after dissection and had no EKG changes. EF normal. She remains CP free. No recurrent left arm pain and no dyspnea. Plan is to let her RCA heal and if she has recurring symptoms, the RCA can be intervened upon after 6-8 weeks. Continue DAPT with ASA and Brilinta, along with high intensity statin, BB and ACE-I.   2. HTN: controlled on current regimen. Continue Coreg, lisinopril, amlodipine and hydralazine.    PLAN  Continue current regimen as outlined above. She lives in [place]. Plan is to f/u with Dr. Harl Bowie.   Betzabe Bevans PA-C 01/31/2016 4:00 PM

## 2016-02-03 ENCOUNTER — Ambulatory Visit: Payer: Medicare Other | Admitting: Physician Assistant

## 2016-02-12 ENCOUNTER — Other Ambulatory Visit: Payer: Self-pay | Admitting: Cardiology

## 2016-02-12 MED ORDER — LISINOPRIL 40 MG PO TABS: 40.0000 mg | ORAL_TABLET | Freq: Every day | ORAL | 11 refills | Status: DC

## 2016-02-24 ENCOUNTER — Other Ambulatory Visit: Payer: Self-pay | Admitting: Cardiology

## 2016-02-24 MED ORDER — HYDRALAZINE HCL 100 MG PO TABS: 100.0000 mg | ORAL_TABLET | Freq: Two times a day (BID) | ORAL | 3 refills | Status: DC

## 2016-03-05 ENCOUNTER — Encounter: Payer: Self-pay | Admitting: Cardiology

## 2016-03-05 ENCOUNTER — Ambulatory Visit (INDEPENDENT_AMBULATORY_CARE_PROVIDER_SITE_OTHER): Payer: Medicare Other | Admitting: Cardiology

## 2016-03-05 VITALS — BP 141/76 | HR 58 | Ht 63.0 in | Wt 201.0 lb

## 2016-03-05 DIAGNOSIS — R5383 Other fatigue: Secondary | ICD-10-CM

## 2016-03-05 DIAGNOSIS — I2510 Atherosclerotic heart disease of native coronary artery without angina pectoris: Secondary | ICD-10-CM | POA: Diagnosis not present

## 2016-03-05 DIAGNOSIS — I5032 Chronic diastolic (congestive) heart failure: Secondary | ICD-10-CM

## 2016-03-05 MED ORDER — CARVEDILOL 12.5 MG PO TABS: 12.5000 mg | ORAL_TABLET | Freq: Two times a day (BID) | ORAL | 6 refills | Status: DC

## 2016-03-05 NOTE — Patient Instructions (Signed)
Medication Instructions:   Decrease Coreg to 12.5mg  twice a day.  Stop Hydralazine.  Continue all other medications.    Labwork: none  Testing/Procedures: none  Follow-Up: 3 weeks   Any Other Special Instructions Will Be Listed Below (If Applicable).  If you need a refill on your cardiac medications before your next appointment, please call your pharmacy.

## 2016-03-05 NOTE — Progress Notes (Signed)
Clinical Summary Ms. Reves is a 65 y.o.female seen today for hospital follow up, this is our first visit together.  1. CAD - recent admit 01/2016 with NSTEMI. Full cath report below. Received DES to OM. Unsuccesful PCI to RCA with wire induced dissection, managed medically. The OM was thought to be the culprit. If recurring symptoms can consider repeat RCA intervention.  - reports mild nagging symptoms over the last 2 weeks. Can occur at rest or with exertion. Dull aching pain at times lasting just a few seconds. 3 total episodes last week. Can have some fatigue with activities, no significant SOB.    2. Chronic diastolic HF - no recent edema. Denies any SOB  Past Medical History:  Diagnosis Date  . Anxiety   . Arthritis    "hands, arms, back, neck, knees, fingers" (01/21/2016)  . Atrial fibrillation (HCC)    ASA daiy; came off Coumadin shortly after stroke  . Breast cancer, left breast ([place]) 1970s; 2015  . Cancer of skin of leg    BLE  . 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HCC)    left  . Chronic lower back pain   . Chronic pain   . COPD (chronic obstructive pulmonary disease) ([place])   . Coronary artery disease   . CVA (cerebral vascular accident) [facility]) pt denies right brain CVA   Right brain secondary to right internal carotid artery dissection for which 2 stents were placed;F/u cerebral angiography in 05/2008-minimal plaque; healing of previously identified left internal carotid artery dissection  . Depression   . GERD (gastroesophageal reflux disease)   . Heart murmur   . History of blood transfusion    "related to spleen"  . History of hiatal hernia   . History of kidney stones   . Hyperlipidemia   . Hypertension   . Hypothyroidism   . Kidney stone   . Melanoma of forearm, left ([place])   . Migraine    "a few migraines/year" (01/21/2016)  . NSTEMI (non-ST elevated myocardial infarction) ([place]) 01/21/2016   Archie Endo 01/21/2016  . Obesity   . Stroke [facility]) 04/2003   2 carotid  artery stents in place; small left parietal and left hemispheric CVA.Phillis Knack 01/21/2016  . Tobacco use disorder    50 pack years; questionably discontinued in 2010  . Wears glasses      Allergies  Allergen Reactions  . Tape Other (See Comments)    Causes blisters to form  . Latex Rash    Blisters  . Tape Rash     Current Outpatient Prescriptions  Medication Sig Dispense Refill  . albuterol (PROVENTIL) (2.5 MG/3ML) 0.083% nebulizer solution Take 2.5 mg by nebulization every 6 (six) hours as needed for wheezing or shortness of breath.    Marland Kitchen amLODipine (NORVASC) 5 MG tablet Take 1 tablet (5 mg total) by mouth daily. 30 tablet 12  . aspirin EC 81 MG tablet Take 1 tablet (81 mg total) by mouth daily.    Marland Kitchen atorvastatin (LIPITOR) 80 MG tablet Take 1 tablet (80 mg total) by mouth daily at 6 PM. 30 tablet 12  . carvedilol (COREG) 25 MG tablet Take 1 tablet (25 mg total) by mouth 2 (two) times daily with a meal. 60 tablet 12  . cetirizine (ZYRTEC) 10 MG tablet Take 10 mg by mouth daily.    . clorazepate (TRANXENE) 7.5 MG tablet Take 1 tablet by mouth 3 (three) times daily.    Marland Kitchen estradiol (ESTRACE) 1 MG tablet Take 1 mg by mouth  daily.    . fenofibrate (TRICOR) 145 MG tablet Take 145 mg by mouth every morning.     . furosemide (LASIX) 40 MG tablet Take 40 mg by mouth daily as needed for fluid.     . hydrALAZINE (APRESOLINE) 100 MG tablet Take 1 tablet (100 mg total) by mouth 2 (two) times daily. 180 tablet 3  . HYDROcodone-acetaminophen (NORCO) 10-325 MG tablet Take 1 tablet by mouth 4 (four) times daily as needed for moderate pain (Lower back).    Marland Kitchen HYDROcodone-acetaminophen (NORCO) 5-325 MG tablet Take 1 tablet by mouth every 4 (four) hours as needed. (Patient not taking: Reported on 01/21/2016) 10 tablet 0  . levothyroxine (SYNTHROID, LEVOTHROID) 50 MCG tablet Take 50 mcg by mouth daily before breakfast.     . lisinopril (PRINIVIL,ZESTRIL) 40 MG tablet Take 1 tablet (40 mg total) by mouth daily.  30 tablet 11  . nitroGLYCERIN (NITROSTAT) 0.4 MG SL tablet Place 0.4 mg under the tongue every 5 (five) minutes as needed.      . pantoprazole (PROTONIX) 40 MG tablet Take 40 mg by mouth daily.    . potassium chloride SA (K-DUR,KLOR-CON) 20 MEQ tablet Take 20 mEq by mouth daily as needed (when Lasix 40mg  is taken).     Marland Kitchen PROAIR HFA 108 (90 Base) MCG/ACT inhaler Inhale 1 puff into the lungs every 6 (six) hours as needed for wheezing or shortness of breath.     . senna (SENOKOT) 8.6 MG tablet Take 1 tablet by mouth daily.    . temazepam (RESTORIL) 30 MG capsule Take 30 mg by mouth at bedtime as needed for sleep.    . ticagrelor (BRILINTA) 90 MG TABS tablet Take 1 tablet (90 mg total) by mouth 2 (two) times daily. 60 tablet 11  . ticagrelor (BRILINTA) 90 MG TABS tablet Take 1 tablet (90 mg total) by mouth 2 (two) times daily. 60 tablet 0   No current facility-administered medications for this visit.      Past Surgical History:  Procedure Laterality Date  . ABDOMINAL HYSTERECTOMY  1978  . APPENDECTOMY    . BREAST BIOPSY Left 1970s; 2015  . BREAST LUMPECTOMY Left 1970s  . BREAST LUMPECTOMY WITH NEEDLE LOCALIZATION Left 07/07/2013   Procedure: BREAST LUMPECTOMY WITH NEEDLE LOCALIZATION;  Surgeon: Merrie Roof, MD;  Location: [place];  Service: General;  Laterality: Left;  . CARDIAC CATHETERIZATION N/A 01/22/2016   Procedure: Left Heart Cath and Coronary Angiography;  Surgeon: Peter M Martinique, MD;  Location: [place] [facility];  Service: Cardiovascular;  Laterality: N/A;  . CARDIAC CATHETERIZATION N/A 01/22/2016   Procedure: Coronary Stent Intervention;  Surgeon: Peter M Martinique, MD;  Location: [place] [facility];  Service: Cardiovascular;  Laterality: N/A;  . CAROTID STENT Left 04/2003   small left parietal and left hemispheric CVA/notes 07/29/2010  . CHOLECYSTECTOMY OPEN    . COLONOSCOPY  2011  . ESOPHAGOGASTRODUODENOSCOPY  08/06/2011   Procedure: ESOPHAGOGASTRODUODENOSCOPY  (EGD);  Surgeon: Rogene Houston, MD;  [facility];  Service: Endoscopy;  Laterality: N/A;  . FLEXIBLE SIGMOIDOSCOPY  08/06/2011   Procedure: FLEXIBLE SIGMOIDOSCOPY;  Surgeon: Rogene Houston, MD;  [facility];  Service: Endoscopy;  Laterality: N/A;  . INGUINAL HERNIA REPAIR Left 1970s  . LARYNX SURGERY  1970s   Polyps excised  . MELANOMA EXCISION Left ~ 2016   forearm  . 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  Tape Other (See Comments)    Causes blisters to form  . Latex Rash    Blisters  . Tape Rash      Family History  Problem Relation Age of Onset  . Heart disease    . Arthritis    . Cancer    . Diabetes    . Kidney disease    . Cancer Sister   . Heart failure Brother      Social History Ms. Woolman reports that she quit smoking about 8 years ago. Her smoking use included Cigarettes. She has a 40.00 pack-year smoking history. She has never used smokeless tobacco. Ms. Rigali reports that she does not drink alcohol.   Review of Systems CONSTITUTIONAL:+fatigue HEENT: Eyes: No visual loss, blurred vision, double vision or yellow sclerae.No hearing loss, sneezing, congestion, runny nose or sore throat.  SKIN: No rash or itching.  CARDIOVASCULAR: per hpi RESPIRATORY: No shortness of breath, cough or sputum.  GASTROINTESTINAL: No anorexia, nausea, vomiting or diarrhea. No abdominal pain or blood.  GENITOURINARY: No burning on urination, no polyuria NEUROLOGICAL: No headache, dizziness, syncope, paralysis, ataxia, numbness or tingling in the extremities. No change in bowel or bladder control.  MUSCULOSKELETAL: No muscle, back pain, joint pain or stiffness.  LYMPHATICS: No enlarged nodes. No history of splenectomy.  PSYCHIATRIC: No history of depression or anxiety.  ENDOCRINOLOGIC: No reports of sweating, cold or heat intolerance. No polyuria or polydipsia.  Marland Kitchen   Physical Examination Vitals:    03/05/16 1015  BP: (!) 141/76  Pulse: (!) 58   Vitals:   03/05/16 1015  Weight: 201 lb (91.2 kg)  Height: 5\' 3"  (1.6 m)    Gen: resting comfortably, no acute distress HEENT: no scleral icterus, pupils equal round and reactive, no palptable cervical adenopathy,  : RRR, no m/r/g, no jvd Resp: Clear to auscultation bilaterally GI: abdomen is soft, non-tender, non-distended, normal bowel sounds, no hepatosplenomegaly MSK: extremities are warm, no edema.  Skin: warm, no rash Neuro:  no focal deficits Psych: appropriate affect   Diagnostic Studies 01/2016 cath  The left ventricular systolic function is normal.  LV end diastolic pressure is normal.  The left ventricular ejection fraction is 55-65% by visual estimate.  Prox RCA lesion, 90 %stenosed.  1st Diag lesion, 60 %stenosed.  Prox LAD to Mid LAD lesion, 20 %stenosed.  2nd Diag lesion, 40 %stenosed.  Ost Ramus to Ramus lesion, 50 %stenosed.  1st Mrg lesion, 99 %stenosed.  A STENT SYNERGY DES 2.75X20 drug eluting stent was successfully placed.  Post intervention, there is a 0% residual stenosis.  Ost RCA to Prox RCA lesion, 90 %stenosed.  Post intervention, there is a 90% residual stenosis with dissection from the proximal to mid RCA.   1. Severe 2 vessel obstructive CAD 2. Normal LV function and LV EDP 3. Successful stenting of the first OM with DES. This appears to be the culprit vessel. 4. Unsuccessful PCI of the RCA due to wire induced dissection of the vessel.   Plan: DAPT for one year. Aggressive BP control. Will continue IV Ntg overnight. Despite RCA dissection patient is pain free, hemodynamically stable and has a normal Ecg. If her symptoms remain stable I would treat medically and allow the RCA to heal. If she continues to have angina attempt at PCI of the RCA could be considered once the vessel has had time to heal - 6-8 weeks.    01/2016 echo Study Conclusions  - Left ventricle: The cavity size  was normal.  Systolic function was   vigorous. The estimated ejection fraction was in the range of 65%   to 70%. Wall motion was normal; there were no regional wall   motion abnormalities. There was an increased relative   contribution of atrial contraction to ventricular filling.   Doppler parameters are consistent with abnormal left ventricular   relaxation (grade 1 diastolic dysfunction). Doppler parameters   are consistent with high ventricular filling pressure. - Tricuspid valve: There was trivial regurgitation. - Pulmonary arteries: PA peak pressure: 32 mm Hg (S). - Pericardium, extracardiac: A trivial, free-flowing pericardial   effusion was identified along the right ventricular free wall.   The fluid had no internal echoes.    Assessment and Plan  1. CAD - some mild chest pain at home lasting just a few seconds. Primary compliant is significant fatigue, from chart review coreg recently increased from 3.125mg  bid to 25mg  bid. We will lower to 12.5mg  bid and follow symptoms. Monitor chest pain symptoms, if progress may need to consider intervention on her RCA  2. Chronic diastolic HF - appears euvolemic - continue current meds  3. HTN - she has stopped her hydralazine. Bp's remain at goal, will not restart.    F/u 3 weeks  Arnoldo Lenis, M.D.

## 2016-03-12 ENCOUNTER — Encounter (HOSPITAL_COMMUNITY): Admission: RE | Admit: 2016-03-12 | Payer: Medicare Other | Source: Ambulatory Visit

## 2016-03-27 ENCOUNTER — Ambulatory Visit (INDEPENDENT_AMBULATORY_CARE_PROVIDER_SITE_OTHER): Payer: Medicare Other | Admitting: Cardiology

## 2016-03-27 ENCOUNTER — Other Ambulatory Visit: Payer: Self-pay | Admitting: Cardiology

## 2016-03-27 ENCOUNTER — Encounter: Payer: Self-pay | Admitting: Cardiology

## 2016-03-27 VITALS — BP 148/82 | HR 67 | Ht 61.0 in | Wt 201.0 lb

## 2016-03-27 DIAGNOSIS — I10 Essential (primary) hypertension: Secondary | ICD-10-CM | POA: Diagnosis not present

## 2016-03-27 DIAGNOSIS — I2510 Atherosclerotic heart disease of native coronary artery without angina pectoris: Secondary | ICD-10-CM

## 2016-03-27 DIAGNOSIS — I5032 Chronic diastolic (congestive) heart failure: Secondary | ICD-10-CM | POA: Diagnosis not present

## 2016-03-27 LAB — BASIC METABOLIC PANEL
BUN: 15 mg/dL (ref 7–25)
CALCIUM: 9.7 mg/dL (ref 8.6–10.4)
CO2: 29 mmol/L (ref 20–31)
CREATININE: 0.89 mg/dL (ref 0.50–0.99)
Chloride: 104 mmol/L (ref 98–110)
Glucose, Bld: 78 mg/dL (ref 65–99)
Potassium: 4.3 mmol/L (ref 3.5–5.3)
SODIUM: 139 mmol/L (ref 135–146)

## 2016-03-27 LAB — LIPID PANEL
CHOL/HDL RATIO: 3.4 ratio (ref <5.0)
CHOLESTEROL: 119 mg/dL (ref <200)
HDL: 35 mg/dL — AB (ref >50)
LDL Cholesterol: 64 mg/dL (ref <100)
Triglycerides: 99 mg/dL (ref <150)
VLDL: 20 mg/dL (ref <30)

## 2016-03-27 LAB — HEMOGLOBIN A1C
Hgb A1c MFr Bld: 6.3 % — ABNORMAL HIGH (ref <5.7)
Mean Plasma Glucose: 134 mg/dL

## 2016-03-27 NOTE — Progress Notes (Signed)
Clinical Summary Ms. Cedotal is a 66 y.o.female seen today for follow up of the following medical problems.   1. CAD - recent admit 01/2016 with NSTEMI. Full cath report below. Received DES to OM. Unsuccesful PCI to RCA with wire induced dissection, managed medically. The OM was thought to be the culprit. If recurring symptoms can consider repeat RCA intervention. .   - last visit due to fatigue we lowered her coreg to 12.5mg  bid. Symptoms have resolved.  - no recent chest pain. No significant SOB.  - has upcoming rehab appt  2. Chronic diastolic HF - she denies any recent edema. Denies any SOB  3. HTN - she checks at home daily, home bp's typically around 140s/70s  Past Medical History:  Diagnosis Date  . Anxiety   . Arthritis    "hands, arms, back, neck, knees, fingers" (01/21/2016)  . Atrial fibrillation (HCC)    ASA daiy; came off Coumadin shortly after stroke  . Breast cancer, left breast ([place]) 1970s; 2015  . Cancer of skin of leg    BLE  . 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HCC)    left  . Chronic lower back pain   . Chronic pain   . COPD (chronic obstructive pulmonary disease) ([place])   . Coronary artery disease   . CVA (cerebral vascular accident) [facility]) pt denies right brain CVA   Right brain secondary to right internal carotid artery dissection for which 2 stents were placed;F/u cerebral angiography in 05/2008-minimal plaque; healing of previously identified left internal carotid artery dissection  . Depression   . GERD (gastroesophageal reflux disease)   . Heart murmur   . History of blood transfusion    "related to spleen"  . History of hiatal hernia   . History of kidney stones   . Hyperlipidemia   . Hypertension   . Hypothyroidism   . Kidney stone   . Melanoma of forearm, left ([place])   . Migraine    "a few migraines/year" (01/21/2016)  . NSTEMI (non-ST elevated myocardial infarction) ([place]) 01/21/2016   Archie Endo 01/21/2016  . Obesity   . Stroke [facility]) 04/2003   2 carotid artery stents in place; small left parietal and left hemispheric CVA.Phillis Knack 01/21/2016  . Tobacco use disorder    50 pack years; questionably discontinued in 2010  . Wears glasses      Allergies  Allergen Reactions  . Tape Other (See Comments)    Causes blisters to form  . Latex Rash    Blisters  . Tape Rash     Current Outpatient Prescriptions  Medication Sig Dispense Refill  . albuterol (PROVENTIL) (2.5 MG/3ML) 0.083% nebulizer solution Take 2.5 mg by nebulization every 6 (six) hours as needed for wheezing or shortness of breath.    Marland Kitchen amLODipine (NORVASC) 5 MG tablet Take 1 tablet (5 mg total) by mouth daily. 30 tablet 12  . aspirin EC 81 MG tablet Take 1 tablet (81 mg total) by mouth daily.    Marland Kitchen atorvastatin (LIPITOR) 80 MG tablet Take 1 tablet (80 mg total) by mouth daily at 6 PM. 30 tablet 12  . carvedilol (COREG) 12.5 MG tablet Take 1 tablet (12.5 mg total) by mouth 2 (two) times daily with a meal. 60 tablet 6  . cetirizine (ZYRTEC) 10 MG tablet Take 10 mg by mouth daily.    . clorazepate (TRANXENE) 7.5 MG tablet Take 1 tablet by mouth 3 (three) times daily.    Marland Kitchen estradiol (ESTRACE) 1 MG tablet Take  1 mg by mouth daily.    . fenofibrate (TRICOR) 145 MG tablet Take 145 mg by mouth every morning.     . furosemide (LASIX) 40 MG tablet Take 40 mg by mouth daily as needed for fluid.     Marland Kitchen HYDROcodone-acetaminophen (NORCO) 10-325 MG tablet Take 1 tablet by mouth 4 (four) times daily as needed for moderate pain (Lower back).    Marland Kitchen levothyroxine (SYNTHROID, LEVOTHROID) 50 MCG tablet Take 50 mcg by mouth daily before breakfast.     . lisinopril (PRINIVIL,ZESTRIL) 40 MG tablet Take 1 tablet (40 mg total) by mouth daily. 30 tablet 11  . nitroGLYCERIN (NITROSTAT) 0.4 MG SL tablet Place 0.4 mg under the tongue every 5 (five) minutes as needed.      . pantoprazole (PROTONIX) 40 MG tablet Take 40 mg by mouth daily.    . potassium chloride SA (K-DUR,KLOR-CON) 20 MEQ tablet Take 20 mEq  by mouth daily as needed (when Lasix 40mg  is taken).     Marland Kitchen PROAIR HFA 108 (90 Base) MCG/ACT inhaler Inhale 1 puff into the lungs every 6 (six) hours as needed for wheezing or shortness of breath.     . senna (SENOKOT) 8.6 MG tablet Take 1 tablet by mouth daily.    . temazepam (RESTORIL) 30 MG capsule Take 30 mg by mouth at bedtime as needed for sleep.    . ticagrelor (BRILINTA) 90 MG TABS tablet Take 1 tablet (90 mg total) by mouth 2 (two) times daily. 60 tablet 11  . ticagrelor (BRILINTA) 90 MG TABS tablet Take 1 tablet (90 mg total) by mouth 2 (two) times daily. 60 tablet 0   No current facility-administered medications for this visit.      Past Surgical History:  Procedure Laterality Date  . ABDOMINAL HYSTERECTOMY  1978  . APPENDECTOMY    . BREAST BIOPSY Left 1970s; 2015  . BREAST LUMPECTOMY Left 1970s  . BREAST LUMPECTOMY WITH NEEDLE LOCALIZATION Left 07/07/2013   Procedure: BREAST LUMPECTOMY WITH NEEDLE LOCALIZATION;  Surgeon: Merrie Roof, MD;  Location: [place] [facility];  Service: General;  Laterality: Left;  . CARDIAC CATHETERIZATION N/A 01/22/2016   Procedure: Left Heart Cath and Coronary Angiography;  Surgeon: Peter M Martinique, MD;  Location: [place] [facility];  Service: Cardiovascular;  Laterality: N/A;  . CARDIAC CATHETERIZATION N/A 01/22/2016   Procedure: Coronary Stent Intervention;  Surgeon: Peter M Martinique, MD;  Location: [place] [facility];  Service: Cardiovascular;  Laterality: N/A;  . CAROTID STENT Left 04/2003   small left parietal and left hemispheric CVA/notes 07/29/2010  . CHOLECYSTECTOMY OPEN    . COLONOSCOPY  2011  . ESOPHAGOGASTRODUODENOSCOPY  08/06/2011   Procedure: ESOPHAGOGASTRODUODENOSCOPY (EGD);  Surgeon: Rogene Houston, MD;  [facility];  Service: Endoscopy;  Laterality: N/A;  . FLEXIBLE SIGMOIDOSCOPY  08/06/2011   Procedure: FLEXIBLE SIGMOIDOSCOPY;  Surgeon: Rogene Houston, MD;  [facility];  Service: Endoscopy;  Laterality:  N/A;  . INGUINAL HERNIA REPAIR Left 1970s  . LARYNX SURGERY  1970s   Polyps excised  . MELANOMA EXCISION Left ~ 2016   forearm  . 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 Tape Other (See Comments)    Causes blisters to form  . Latex Rash    Blisters  . Tape Rash      Family History  Problem Relation Age of Onset  . Heart disease    .  Arthritis    . Cancer    . Diabetes    . Kidney disease    . Cancer Sister   . Heart failure Brother      Social History Ms. Wentworth reports that she quit smoking about 8 years ago. Her smoking use included Cigarettes. She has a 40.00 pack-year smoking history. She has never used smokeless tobacco. Ms. Canavan reports that she does not drink alcohol.   Review of Systems CONSTITUTIONAL: No weight loss, fever, chills, weakness or fatigue.  HEENT: Eyes: No visual loss, blurred vision, double vision or yellow sclerae.No hearing loss, sneezing, congestion, runny nose or sore throat.  SKIN: No rash or itching.  CARDIOVASCULAR: per hpi RESPIRATORY: No shortness of breath, cough or sputum.  GASTROINTESTINAL: No anorexia, nausea, vomiting or diarrhea. No abdominal pain or blood.  GENITOURINARY: No burning on urination, no polyuria NEUROLOGICAL: No headache, dizziness, syncope, paralysis, ataxia, numbness or tingling in the extremities. No change in bowel or bladder control.  MUSCULOSKELETAL: No muscle, back pain, joint pain or stiffness.  LYMPHATICS: No enlarged nodes. No history of splenectomy.  PSYCHIATRIC: No history of depression or anxiety.  ENDOCRINOLOGIC: No reports of sweating, cold or heat intolerance. No polyuria or polydipsia.  Marland Kitchen   Physical Examination Vitals:   03/27/16 0936  BP: (!) 148/82  Pulse: 67   Vitals:   03/27/16 0936  Weight: 201 lb (91.2 kg)  Height: 5\' 1"  (1.549 m)    Gen: resting comfortably, no acute distress HEENT: no scleral icterus, pupils equal round and  reactive, no palptable cervical adenopathy,  CV: RRR, no m/r/g, no jvd Resp: Clear to auscultation bilaterally GI: abdomen is soft, non-tender, non-distended, normal bowel sounds, no hepatosplenomegaly MSK: extremities are warm, no edema.  Skin: warm, no rash Neuro:  no focal deficits Psych: appropriate affect   Diagnostic Studies 01/2016 cath  The left ventricular systolic function is normal.  LV end diastolic pressure is normal.  The left ventricular ejection fraction is 55-65% by visual estimate.  Prox RCA lesion, 90 %stenosed.  1st Diag lesion, 60 %stenosed.  Prox LAD to Mid LAD lesion, 20 %stenosed.  2nd Diag lesion, 40 %stenosed.  Ost Ramus to Ramus lesion, 50 %stenosed.  1st Mrg lesion, 99 %stenosed.  A STENT SYNERGY DES 2.75X20 drug eluting stent was successfully placed.  Post intervention, there is a 0% residual stenosis.  Ost RCA to Prox RCA lesion, 90 %stenosed.  Post intervention, there is a 90% residual stenosis with dissection from the proximal to mid RCA.  1. Severe 2 vessel obstructive CAD 2. Normal LV function and LV EDP 3. Successful stenting of the first OM with DES. This appears to be the culprit vessel. 4. Unsuccessful PCI of the RCA due to wire induced dissection of the vessel.   Plan: DAPT for one year. Aggressive BP control. Will continue IV Ntg overnight. Despite RCA dissection patient is pain free, hemodynamically stable and has a normal Ecg. If her symptoms remain stable I would treat medically and allow the RCA to heal. If she continues to have angina attempt at PCI of the RCA could be considered once the vessel has had time to heal - 6-8 weeks.    01/2016 echo Study Conclusions  - Left ventricle: The cavity size was normal. Systolic function was vigorous. The estimated ejection fraction was in the range of 65% to 70%. Wall motion was normal; there were no regional wall motion abnormalities. There was an increased  relative contribution of atrial contraction to  ventricular filling. Doppler parameters are consistent with abnormal left ventricular relaxation (grade 1 diastolic dysfunction). Doppler parameters are consistent with high ventricular filling pressure. - Tricuspid valve: There was trivial regurgitation. - Pulmonary arteries: PA peak pressure: 32 mm Hg (S). - Pericardium, extracardiac: A trivial, free-flowing pericardial effusion was identified along the right ventricular free wall. The fluid had no internal echoes.    Assessment and Plan  1. CAD - no recent symptoms - fatigue has resolved with lower dose of coreg - continue current meds  2. Chronic diastolic HF - she appears euvolemic - she will continue current meds  3. HTN - elevated in clinic. She will present bp log in 1 week. Pending results may need to increase her norvasc to 10mg  daily.    F/u 6 months. Check BMET, FLP, HgbA1c      Arnoldo Lenis, M.D.

## 2016-03-27 NOTE — Patient Instructions (Signed)
Your physician wants you to follow-up in: [place] DR. BRANCH You will receive a reminder letter in the mail two months in advance. If you don't receive a letter, please call our office to schedule the follow-up appointment.  Your physician recommends that you continue on your current medications as directed. Please refer to the Current Medication list given to you today.  Your physician recommends that you return for lab work BMP/LIPIDS/HGBA1C  Your physician has requested that you regularly monitor and record your blood pressure readings at home FOR 1 [place]. Please use the same machine at the same time of day to check your readings and record them to bring to your follow-up visit.  Thank you for choosing [facility]!!

## 2016-03-31 ENCOUNTER — Telehealth: Payer: Self-pay | Admitting: *Deleted

## 2016-03-31 NOTE — Telephone Encounter (Signed)
Pt aware - routed to pcp  

## 2016-03-31 NOTE — Telephone Encounter (Signed)
-----   Message from Arnoldo Lenis, MD sent at 03/30/2016  1:06 PM EST ----- Labs look good other than evidence of prediabetes. This means her body is starting to go through the changes toward diabetes but has not completely changed yet. She needs to work on diet, exercise and weight loss to keep from progressing to diabetes  Zandra Abts MD

## 2016-04-02 ENCOUNTER — Encounter (HOSPITAL_COMMUNITY): Payer: Medicare Other

## 2016-04-03 ENCOUNTER — Telehealth: Payer: Self-pay | Admitting: Cardiology

## 2016-04-03 NOTE — Telephone Encounter (Signed)
[name] called stating that she was told to call the office after one week 6f checking her BP readings.

## 2016-04-03 NOTE — Telephone Encounter (Signed)
03/28/16 BP 137/64 03/29/16 BP 118/78 03/30/16 BP 136/79 03/31/16 BP 164/84 04/01/16 BP 136/128 04/02/16 BP 146/71 04/03/16 BP 137/82

## 2016-04-06 NOTE — Telephone Encounter (Signed)
Overall bp's look ok, no med changes at this time   Zandra Abts MD

## 2016-04-07 NOTE — Telephone Encounter (Signed)
Patient notified via voicemail.

## 2016-04-24 ENCOUNTER — Encounter (HOSPITAL_COMMUNITY): Payer: Medicare Other

## 2016-08-13 ENCOUNTER — Telehealth: Payer: Self-pay | Admitting: Cardiology

## 2016-08-13 NOTE — Telephone Encounter (Signed)
Per pt phone call- she's having weakness, pain in lt arm, BP running low,  has bruising on her arms/stomach, and ankle swelling. Would like someone to give her a call @ [phone] or you can contact her social worker [name] cell [phone] work # [phone] ext [phone]

## 2016-08-13 NOTE — Telephone Encounter (Signed)
I spoke with Research scientist (life sciences) and they will see pcp first

## 2016-08-13 NOTE — Telephone Encounter (Signed)
Forward to Dr Halina Andreas aware

## 2016-08-13 NOTE — Telephone Encounter (Signed)
Multiple symptoms unclear if heart related, she needs to start evaluation with her pcp. If found to be a cardiac issue going on then we can further pursue   J Reeta Kuk MD

## 2016-08-21 ENCOUNTER — Emergency Department (HOSPITAL_COMMUNITY): Payer: Medicare Other

## 2016-08-21 ENCOUNTER — Inpatient Hospital Stay (HOSPITAL_COMMUNITY)
Admission: EM | Admit: 2016-08-21 | Discharge: 2016-08-23 | DRG: 310 | Disposition: A | Discharge status: Home / Self Care | Payer: Medicare Other | Attending: Internal Medicine | Admitting: Internal Medicine

## 2016-08-21 ENCOUNTER — Encounter (HOSPITAL_COMMUNITY): Payer: Self-pay | Admitting: Emergency Medicine

## 2016-08-21 DIAGNOSIS — I10 Essential (primary) hypertension: Secondary | ICD-10-CM | POA: Diagnosis not present

## 2016-08-21 DIAGNOSIS — Z853 Personal history of malignant neoplasm of breast: Secondary | ICD-10-CM | POA: Diagnosis not present

## 2016-08-21 DIAGNOSIS — Z91040 Latex allergy status: Secondary | ICD-10-CM

## 2016-08-21 DIAGNOSIS — E039 Hypothyroidism, unspecified: Secondary | ICD-10-CM | POA: Diagnosis not present

## 2016-08-21 DIAGNOSIS — J449 Chronic obstructive pulmonary disease, unspecified: Secondary | ICD-10-CM | POA: Diagnosis not present

## 2016-08-21 DIAGNOSIS — G8929 Other chronic pain: Secondary | ICD-10-CM | POA: Diagnosis present

## 2016-08-21 DIAGNOSIS — K219 Gastro-esophageal reflux disease without esophagitis: Secondary | ICD-10-CM | POA: Diagnosis present

## 2016-08-21 DIAGNOSIS — R001 Bradycardia, unspecified: Principal | ICD-10-CM | POA: Diagnosis present

## 2016-08-21 DIAGNOSIS — Z85820 Personal history of malignant melanoma of skin: Secondary | ICD-10-CM

## 2016-08-21 DIAGNOSIS — Z87891 Personal history of nicotine dependence: Secondary | ICD-10-CM

## 2016-08-21 DIAGNOSIS — I2510 Atherosclerotic heart disease of native coronary artery without angina pectoris: Secondary | ICD-10-CM | POA: Diagnosis not present

## 2016-08-21 DIAGNOSIS — I4891 Unspecified atrial fibrillation: Secondary | ICD-10-CM | POA: Diagnosis not present

## 2016-08-21 DIAGNOSIS — Z955 Presence of coronary angioplasty implant and graft: Secondary | ICD-10-CM | POA: Diagnosis not present

## 2016-08-21 DIAGNOSIS — Z79899 Other long term (current) drug therapy: Secondary | ICD-10-CM | POA: Diagnosis not present

## 2016-08-21 DIAGNOSIS — Z8673 Personal history of transient ischemic attack (TIA), and cerebral infarction without residual deficits: Secondary | ICD-10-CM | POA: Diagnosis not present

## 2016-08-21 DIAGNOSIS — Z7982 Long term (current) use of aspirin: Secondary | ICD-10-CM | POA: Diagnosis not present

## 2016-08-21 DIAGNOSIS — E785 Hyperlipidemia, unspecified: Secondary | ICD-10-CM | POA: Diagnosis not present

## 2016-08-21 DIAGNOSIS — Z91048 Other nonmedicinal substance allergy status: Secondary | ICD-10-CM

## 2016-08-21 DIAGNOSIS — M545 Low back pain: Secondary | ICD-10-CM | POA: Diagnosis present

## 2016-08-21 DIAGNOSIS — I252 Old myocardial infarction: Secondary | ICD-10-CM | POA: Diagnosis not present

## 2016-08-21 DIAGNOSIS — E669 Obesity, unspecified: Secondary | ICD-10-CM | POA: Diagnosis not present

## 2016-08-21 DIAGNOSIS — Z6834 Body mass index (BMI) 34.0-34.9, adult: Secondary | ICD-10-CM

## 2016-08-21 DIAGNOSIS — Z90710 Acquired absence of both cervix and uterus: Secondary | ICD-10-CM

## 2016-08-21 DIAGNOSIS — Z79891 Long term (current) use of opiate analgesic: Secondary | ICD-10-CM

## 2016-08-21 LAB — BASIC METABOLIC PANEL
ANION GAP: 8 (ref 5–15)
BUN: 11 mg/dL (ref 6–20)
CO2: 26 mmol/L (ref 22–32)
Calcium: 9.3 mg/dL (ref 8.9–10.3)
Chloride: 102 mmol/L (ref 101–111)
Creatinine, Ser: 0.62 mg/dL (ref 0.44–1.00)
GFR calc Af Amer: >60 mL/min (ref >60)
Glucose, Bld: 100 mg/dL — ABNORMAL HIGH (ref 65–99)
Potassium: 3.7 mmol/L (ref 3.5–5.1)
SODIUM: 136 mmol/L (ref 135–145)

## 2016-08-21 LAB — CBC WITH DIFFERENTIAL/PLATELET
BASOS ABS: 0.1 10*3/uL (ref 0.0–0.1)
Basophils Relative: 1 %
EOS ABS: 0.2 10*3/uL (ref 0.0–0.7)
EOS PCT: 2 %
HCT: 43.1 % (ref 36.0–46.0)
Hemoglobin: 14.6 g/dL (ref 12.0–15.0)
LYMPHS PCT: 35 %
Lymphs Abs: 3.6 10*3/uL (ref 0.7–4.0)
MCH: 29 pg (ref 26.0–34.0)
MCHC: 33.9 g/dL (ref 30.0–36.0)
MCV: 85.7 fL (ref 78.0–100.0)
Monocytes Absolute: 0.7 10*3/uL (ref 0.1–1.0)
Monocytes Relative: 7 %
Neutro Abs: 5.7 10*3/uL (ref 1.7–7.7)
Neutrophils Relative %: 55 %
PLATELETS: 362 10*3/uL (ref 150–400)
RBC: 5.03 MIL/uL (ref 3.87–5.11)
RDW: 13.2 % (ref 11.5–15.5)
WBC: 10.3 10*3/uL (ref 4.0–10.5)

## 2016-08-21 LAB — TROPONIN I

## 2016-08-21 MED ORDER — ONDANSETRON HCL 4 MG/2ML IJ SOLN: 4.0000 mg | Freq: Four times a day (QID) | INTRAMUSCULAR | Status: DC | PRN

## 2016-08-21 MED ORDER — LISINOPRIL 10 MG PO TABS
40.0000 mg | ORAL_TABLET | Freq: Every day | ORAL | Status: DC
  Administered 2016-08-22 – 2016-08-23 (×2): 40 mg via ORAL
  Filled 2016-08-21 (×2): qty 4

## 2016-08-21 MED ORDER — ATORVASTATIN CALCIUM 40 MG PO TABS
80.0000 mg | ORAL_TABLET | Freq: Every day | ORAL | Status: DC
  Administered 2016-08-21: 80 mg via ORAL
  Filled 2016-08-21: qty 2

## 2016-08-21 MED ORDER — LORATADINE 10 MG PO TABS
10.0000 mg | ORAL_TABLET | Freq: Every day | ORAL | Status: DC
  Administered 2016-08-22 – 2016-08-23 (×2): 10 mg via ORAL
  Filled 2016-08-21 (×2): qty 1

## 2016-08-21 MED ORDER — AMLODIPINE BESYLATE 5 MG PO TABS
5.0000 mg | ORAL_TABLET | Freq: Every day | ORAL | Status: DC
  Administered 2016-08-22 – 2016-08-23 (×2): 5 mg via ORAL
  Filled 2016-08-21 (×2): qty 1

## 2016-08-21 MED ORDER — ESTRADIOL 1 MG PO TABS
1.0000 mg | ORAL_TABLET | Freq: Every day | ORAL | Status: DC
  Administered 2016-08-22 – 2016-08-23 (×2): 1 mg via ORAL
  Filled 2016-08-21 (×2): qty 1

## 2016-08-21 MED ORDER — TICAGRELOR 90 MG PO TABS
90.0000 mg | ORAL_TABLET | Freq: Two times a day (BID) | ORAL | Status: DC
  Administered 2016-08-21 – 2016-08-23 (×4): 90 mg via ORAL
  Filled 2016-08-21 (×3): qty 1

## 2016-08-21 MED ORDER — ONDANSETRON HCL 4 MG PO TABS: 4.0000 mg | ORAL_TABLET | Freq: Four times a day (QID) | ORAL | Status: DC | PRN

## 2016-08-21 MED ORDER — ACETAMINOPHEN 325 MG PO TABS: 650.0000 mg | ORAL_TABLET | Freq: Four times a day (QID) | ORAL | Status: DC | PRN

## 2016-08-21 MED ORDER — CLORAZEPATE DIPOTASSIUM 7.5 MG PO TABS
7.5000 mg | ORAL_TABLET | Freq: Three times a day (TID) | ORAL | Status: DC
  Administered 2016-08-21 – 2016-08-23 (×5): 7.5 mg via ORAL
  Filled 2016-08-21 (×5): qty 1

## 2016-08-21 MED ORDER — SENNA 8.6 MG PO TABS
1.0000 | ORAL_TABLET | Freq: Every day | ORAL | Status: DC
  Administered 2016-08-22 – 2016-08-23 (×2): 8.6 mg via ORAL
  Filled 2016-08-21 (×2): qty 1

## 2016-08-21 MED ORDER — LEVOTHYROXINE SODIUM 50 MCG PO TABS
50.0000 ug | ORAL_TABLET | Freq: Every day | ORAL | Status: DC
  Administered 2016-08-22 – 2016-08-23 (×2): 50 ug via ORAL
  Filled 2016-08-21 (×2): qty 1

## 2016-08-21 MED ORDER — ASPIRIN EC 81 MG PO TBEC
81.0000 mg | DELAYED_RELEASE_TABLET | Freq: Every day | ORAL | Status: DC
  Administered 2016-08-22 – 2016-08-23 (×2): 81 mg via ORAL
  Filled 2016-08-21 (×2): qty 1

## 2016-08-21 MED ORDER — ACETAMINOPHEN 650 MG RE SUPP: 650.0000 mg | Freq: Four times a day (QID) | RECTAL | Status: DC | PRN

## 2016-08-21 MED ORDER — ALBUTEROL SULFATE (2.5 MG/3ML) 0.083% IN NEBU: 3.0000 mL | INHALATION_SOLUTION | Freq: Four times a day (QID) | RESPIRATORY_TRACT | Status: DC | PRN

## 2016-08-21 MED ORDER — POTASSIUM CHLORIDE CRYS ER 20 MEQ PO TBCR: 20.0000 meq | EXTENDED_RELEASE_TABLET | Freq: Every day | ORAL | Status: DC | PRN

## 2016-08-21 MED ORDER — ENOXAPARIN SODIUM 40 MG/0.4ML ~~LOC~~ SOLN
40.0000 mg | SUBCUTANEOUS | Status: DC
  Administered 2016-08-21 – 2016-08-22 (×2): 40 mg via SUBCUTANEOUS
  Filled 2016-08-21 (×2): qty 0.4

## 2016-08-21 MED ORDER — PANTOPRAZOLE SODIUM 40 MG PO TBEC
40.0000 mg | DELAYED_RELEASE_TABLET | Freq: Every day | ORAL | Status: DC
  Administered 2016-08-22 – 2016-08-23 (×2): 40 mg via ORAL
  Filled 2016-08-21 (×2): qty 1

## 2016-08-21 MED ORDER — HYDROCODONE-ACETAMINOPHEN 10-325 MG PO TABS
1.0000 | ORAL_TABLET | Freq: Four times a day (QID) | ORAL | Status: DC | PRN
  Administered 2016-08-21 – 2016-08-23 (×4): 1 via ORAL
  Filled 2016-08-21 (×4): qty 1

## 2016-08-21 MED ORDER — TEMAZEPAM 15 MG PO CAPS: 30.0000 mg | ORAL_CAPSULE | Freq: Every evening | ORAL | Status: DC | PRN

## 2016-08-21 MED ORDER — FUROSEMIDE 40 MG PO TABS: 40.0000 mg | ORAL_TABLET | Freq: Every day | ORAL | Status: DC | PRN

## 2016-08-21 MED ORDER — FENOFIBRATE 160 MG PO TABS
160.0000 mg | ORAL_TABLET | Freq: Every day | ORAL | Status: DC
  Administered 2016-08-22 – 2016-08-23 (×2): 160 mg via ORAL
  Filled 2016-08-21 (×2): qty 1

## 2016-08-21 MED ORDER — TICAGRELOR 90 MG PO TABS
ORAL_TABLET | ORAL | Status: AC
  Filled 2016-08-21: qty 1

## 2016-08-21 MED ORDER — ALBUTEROL SULFATE (2.5 MG/3ML) 0.083% IN NEBU: 2.5000 mg | INHALATION_SOLUTION | Freq: Four times a day (QID) | RESPIRATORY_TRACT | Status: DC | PRN

## 2016-08-21 MED ORDER — CLORAZEPATE DIPOTASSIUM 7.5 MG PO TABS: 7.5000 mg | ORAL_TABLET | Freq: Once | ORAL | Status: DC

## 2016-08-21 NOTE — ED Provider Notes (Addendum)
[facility] DEPT Provider Note   CSN: [id number] Arrival date & time: 08/21/16  1448     History   Chief Complaint Chief Complaint  Patient presents with  . Hypotension  . Bradycardia    HPI [name] is a 66 y.o. female.  Level V caveat for urgent need for intervention. Patient complains of weakness for 2 days. She noted her blood pressure was low and her pulse was in the 40 range this morning. She called her primary care doctor and was advised to come to the emergency department. No frank chest pain, dyspnea, neurological deficits. She had a stroke in November along with a cardiac stenting procedure. Severity of symptoms is moderate.      Past Medical History:  Diagnosis Date  . Anxiety   . Arthritis    "hands, arms, back, neck, knees, fingers" (01/21/2016)  . Atrial fibrillation (HCC)    ASA daiy; came off Coumadin shortly after stroke  . Breast cancer, left breast ([place]) 1970s; 2015  . Cancer of skin of leg    BLE  . 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HCC)    left  . Chronic lower back pain   . Chronic pain   . COPD (chronic obstructive pulmonary disease) ([place])   . Coronary artery disease   . CVA (cerebral vascular accident) [facility]) pt denies right brain CVA   Right brain secondary to right internal carotid artery dissection for which 2 stents were placed;F/u cerebral angiography in 05/2008-minimal plaque; healing of previously identified left internal carotid artery dissection  . Depression   . GERD (gastroesophageal reflux disease)   . Heart murmur   . History of blood transfusion    "related to spleen"  . History of hiatal hernia   . History of kidney stones   . Hyperlipidemia   . Hypertension   . Hypothyroidism   . Kidney stone   . Melanoma of forearm, left ([place])   . Migraine    "a few migraines/year" (01/21/2016)  . NSTEMI (non-ST elevated myocardial infarction) ([place]) 01/21/2016   Archie Endo 01/21/2016  . Obesity   . Stroke [facility]) 04/2003   2 carotid artery  stents in place; small left parietal and left hemispheric CVA.Phillis Knack 01/21/2016  . Tobacco use disorder    50 pack years; questionably discontinued in 2010  . Wears glasses     Patient Active Problem List   Diagnosis Date Noted  . Bradycardia 08/21/2016  . NSTEMI (non-ST elevation myocardial infarction) ([place]) 01/21/2016  . Chronic diastolic CHF (congestive heart failure) ([place]) 01/01/2016  . Symptomatic bradycardia 09/30/2015  . CVA (cerebral infarction) 09/30/2015  . 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 Essential hypertension 09/30/2015  . Chronic pain 09/30/2015  . Hyperlipidemia 09/30/2015  . Surgical menopause on hormone replacement therapy 09/30/2015  . GERD (gastroesophageal reflux disease) 09/30/2015  . Paroxysmal atrial fibrillation ([place]) 09/30/2015  . Hypothyroidism 09/30/2015  . 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 Breast mass 04/27/2013  . Deltoid tendinitis of left shoulder 11/23/2012  . Rotator cuff tear 10/06/2012  . Rotator cuff syndrome of left shoulder 10/06/2012  . Pain in joint, shoulder region 10/06/2012  . Tendonitis, calcific, shoulder 07/26/2012  . CVA (cerebral infarction)   . COPD (chronic obstructive pulmonary disease) ([place])   . Hypertension   . Hyperlipidemia   . Obesity   . Chest tightness   . Tobacco use disorder     Past Surgical History:  Procedure Laterality Date  . ABDOMINAL HYSTERECTOMY  1978  . APPENDECTOMY    .  BREAST BIOPSY Left 1970s; 2015  . BREAST LUMPECTOMY Left 1970s  . BREAST LUMPECTOMY WITH NEEDLE LOCALIZATION Left 07/07/2013   Procedure: BREAST LUMPECTOMY WITH NEEDLE LOCALIZATION;  Surgeon: Merrie Roof, MD;  Location: [place];  Service: General;  Laterality: Left;  . CARDIAC CATHETERIZATION N/A 01/22/2016   Procedure: Left Heart Cath and Coronary Angiography;  Surgeon: Peter M Martinique, MD;  Location: [place] CV LAB;  Service: Cardiovascular;  Laterality: N/A;  . CARDIAC CATHETERIZATION N/A 01/22/2016    Procedure: Coronary Stent Intervention;  Surgeon: Peter M Martinique, MD;  Location: [place] CV [facility];  Service: Cardiovascular;  Laterality: N/A;  . CAROTID STENT Left 04/2003   small left parietal and left hemispheric CVA/notes 07/29/2010  . CHOLECYSTECTOMY OPEN    . COLONOSCOPY  2011  . ESOPHAGOGASTRODUODENOSCOPY  08/06/2011   Procedure: ESOPHAGOGASTRODUODENOSCOPY (EGD);  Surgeon: Rogene Houston, MD;  [facility];  Service: Endoscopy;  Laterality: N/A;  . FLEXIBLE SIGMOIDOSCOPY  08/06/2011   Procedure: FLEXIBLE SIGMOIDOSCOPY;  Surgeon: Rogene Houston, MD;  [facility];  Service: Endoscopy;  Laterality: N/A;  . INGUINAL HERNIA REPAIR Left 1970s  . LARYNX SURGERY  1970s   Polyps excised  . MELANOMA EXCISION Left ~ 2016   forearm  . 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   spontaneous rupture    OB History    Gravida Para Term Preterm AB Living   2 2 2  0 0 2   SAB TAB Ectopic Multiple Live Births   0 0 0           Home Medications    Prior to Admission medications   Medication Sig Start Date End Date Taking? Authorizing Provider  albuterol (PROVENTIL) (2.5 MG/3ML) 0.083% nebulizer solution Take 2.5 mg by nebulization every 6 (six) hours as needed for wheezing or shortness of breath.   Yes [provider]  amLODipine (NORVASC) 5 MG tablet Take 1 tablet (5 mg total) by mouth daily. 01/25/16  Yes Arbutus Leas, NP  aspirin EC 81 MG tablet Take 1 tablet (81 mg total) by mouth daily. 01/24/16  Yes Arbutus Leas, NP  atorvastatin (LIPITOR) 80 MG tablet Take 1 tablet (80 mg total) by mouth daily at 6 PM. 01/24/16  Yes Arbutus Leas, NP  carvedilol (COREG) 12.5 MG tablet Take 1 tablet (12.5 mg total) by mouth 2 (two) times daily with a meal. 03/05/16  Yes Branch, Alphonse Guild, MD  cetirizine (ZYRTEC) 10 MG tablet Take 10 mg by mouth daily.   Yes [provider]  clorazepate (TRANXENE) 7.5 MG tablet Take 1 tablet by mouth 3 (three) times daily. 01/03/16  Yes  [provider]  estradiol (ESTRACE) 1 MG tablet Take 1 mg by mouth daily. 07/18/15  Yes [provider]  fenofibrate (TRICOR) 145 MG tablet Take 145 mg by mouth every morning.    Yes [provider]  furosemide (LASIX) 40 MG tablet Take 40 mg by mouth daily as needed for fluid.  07/20/14  Yes [provider]  HYDROcodone-acetaminophen (NORCO) 10-325 MG tablet Take 1 tablet by mouth 4 (four) times daily as needed for moderate pain (Lower back).   Yes [provider]  levothyroxine (SYNTHROID, LEVOTHROID) 50 MCG tablet Take 50 mcg by mouth daily before breakfast.  07/20/14  Yes [provider]  lisinopril (PRINIVIL,ZESTRIL) 40 MG tablet Take 1 tablet (40 mg total) by mouth daily. 02/12/16  Yes Lyda Jester M, PA-C  nitroGLYCERIN (NITROSTAT) 0.4  MG SL tablet Place 0.4 mg under the tongue every 5 (five) minutes as needed.     Yes [provider]  pantoprazole (PROTONIX) 40 MG tablet Take 40 mg by mouth daily. 09/13/15  Yes [provider]  potassium chloride SA (K-DUR,KLOR-CON) 20 MEQ tablet Take 20 mEq by mouth daily as needed (when Lasix 40mg  is taken).  08/19/15  Yes [provider]  PROAIR HFA 108 (90 Base) MCG/ACT inhaler Inhale 1 puff into the lungs every 6 (six) hours as needed for wheezing or shortness of breath.  09/13/15  Yes [provider]  senna (SENOKOT) 8.6 MG tablet Take 1 tablet by mouth daily.   Yes [provider]  temazepam (RESTORIL) 30 MG capsule Take 30 mg by mouth at bedtime as needed for sleep.   Yes [provider]  ticagrelor (BRILINTA) 90 MG TABS tablet Take 1 tablet (90 mg total) by mouth 2 (two) times daily. 01/24/16  Yes Arbutus Leas, NP    Family History Family History  Problem Relation Age of Onset  . Heart disease Unknown   . Arthritis Unknown   . Cancer Unknown   . Diabetes Unknown   . Kidney disease Unknown   . Cancer Sister   . Heart failure Brother      Social History Social History  Substance Use Topics  . Smoking status: Former Smoker    Packs/day: 1.00    Years: 40.00    Types: Cigarettes    Quit date: 07/06/2007  . Smokeless tobacco: Never Used  . Alcohol use No     Allergies   Tape and Latex   Review of Systems Review of Systems  Reason unable to perform ROS: Urgent need for intervention.     Physical Exam Updated Vital Signs BP (!) 163/71   Pulse 63   Resp 16   Ht 5\' 3"  (1.6 m)   Wt 90.7 kg (200 lb)   SpO2 97%   BMI 35.43 kg/m   Physical Exam   ED Treatments / Results  Labs (all labs ordered are listed, but only abnormal results are displayed) Labs Reviewed  BASIC METABOLIC PANEL - Abnormal; Notable for the following:       Result Value   Glucose, Bld 100 (*)    All other components within normal limits  CBC WITH DIFFERENTIAL/PLATELET  TROPONIN I    EKG  EKG Interpretation  Date/Time:  Friday August 21 2016 15:04:11 EDT Ventricular Rate:  43 PR Interval:    QRS Duration: 84 QT Interval:  492 QTC Calculation: 415 R Axis:   70 Text Interpretation:  Undetermined rhythm Otherwise normal ECG Confirmed by Nat Christen [phone]) on 08/21/2016 3:19:47 PM       Radiology Dg Chest Port 1 View  Result Date: 08/21/2016 CLINICAL DATA:  Hypotension and bradycardia.  Left arm pain. EXAM: PORTABLE CHEST 1 VIEW COMPARISON:  Two-view chest x-ray 01/21/2016 FINDINGS: The heart size is normal. Aortic atherosclerosis is evident. There is no edema or effusion. No focal airspace disease is present. Scarring at the lung apices bilaterally is stable. The visualized soft tissues and bony thorax are unremarkable. IMPRESSION: No acute cardiopulmonary disease or significant interval change. Electronically Signed   By: San Morelle M.D.   On: 08/21/2016 15:45    Procedures Procedures (including critical care time)  Medications Ordered in ED Medications - No data to display   Initial Impression / Assessment  and Plan / ED Course  I have reviewed the triage  vital signs and the nursing notes.  Pertinent labs & imaging results that were available during my care of the patient were reviewed by me and considered in my medical decision making (see chart for details).     Patient's blood pressure is stable. However, she is bradycardic with a undetermined rhythm. Will discuss with cardiology.  1600:  Cardiologist suggested discontinuation of Coreg and admit for observation. Final Clinical Impressions(s) / ED Diagnoses   Final diagnoses:  Bradycardia    New Prescriptions New Prescriptions   No medications on file     Nat Christen, MD 08/21/16 [place]    Nat Christen, MD 08/21/16 1800

## 2016-08-21 NOTE — ED Triage Notes (Signed)
Pt states has been feeling weak lately. bp up and down. Hr up and down. A/o. Hr 30-55 in triage.

## 2016-08-21 NOTE — H&P (Signed)
History and Physical  [name] HCW:[id number] DOB: 10/29/1950 DOA: 08/21/2016  Referring physician: Dr Lacinda Axon, ED physician PCP: Lemmie Evens, MD  Outpatient Specialists:   Dr. Harl Bowie (cardiology)  Patient Coming From: Home  Chief Complaint: Symptomatic bradycardia  HPI: [name] is a 66 y.o. 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 Patient woke up this morning with nausea dizziness, and was found to have low blood pressure and heart rate in the 40s. She came to emergency department for evaluation after contacting her primary care physician who recommended she be evaluated. Her blood pressures gradually improved since being in the emergency department.  Emergency Department Course: Records been relatively normal. Cardiology consulted by ER physician, who recommended observation.  Review of Systems:   Pt denies any fevers, chills, nausea, vomiting, diarrhea, constipation, abdominal pain, shortness of breath, dyspnea on exertion, orthopnea, cough, wheezing, palpitations, headache, vision changes, lightheadedness, dizziness, melena, rectal bleeding.  Review of systems are otherwise negative  Past Medical History:  Diagnosis Date  . Anxiety   . Arthritis    "hands, arms, back, neck, knees, fingers" (01/21/2016)  . Atrial fibrillation (HCC)    ASA daiy; came off Coumadin shortly after stroke  . Breast cancer, left breast ([place]) 1970s; 2015  . Cancer of skin of leg    BLE  . 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HCC)    left  . Chronic lower back pain   . Chronic pain   . COPD (chronic obstructive pulmonary disease) ([place])   . Coronary artery disease   . CVA (cerebral vascular accident) [facility]) pt denies right brain CVA   Right brain secondary to right  internal carotid artery dissection for which 2 stents were placed;F/u cerebral angiography in 05/2008-minimal plaque; healing of previously identified left internal carotid artery dissection  . Depression   . GERD (gastroesophageal reflux disease)   . Heart murmur   . History of blood transfusion    "related to spleen"  . History of hiatal hernia   . History of kidney stones   . Hyperlipidemia   . Hypertension   . Hypothyroidism   . Kidney stone   . Melanoma of forearm, left ([place])   . Migraine    "a few migraines/year" (01/21/2016)  . NSTEMI (non-ST elevated myocardial infarction) ([place]) 01/21/2016   Archie Endo 01/21/2016  . Obesity   . Stroke [facility]) 04/2003   2 carotid artery stents in place; small left parietal and left hemispheric CVA.Phillis Knack 01/21/2016  . Tobacco use disorder    50 pack years; questionably discontinued in 2010  . Wears glasses    Past Surgical History:  Procedure Laterality Date  . ABDOMINAL HYSTERECTOMY  1978  . APPENDECTOMY    . BREAST BIOPSY Left 1970s; 2015  . BREAST LUMPECTOMY Left 1970s  . BREAST LUMPECTOMY WITH NEEDLE LOCALIZATION Left 07/07/2013   Procedure: BREAST LUMPECTOMY WITH NEEDLE LOCALIZATION;  Surgeon: Merrie Roof, MD;  Location: [place];  Service: General;  Laterality: Left;  . CARDIAC CATHETERIZATION N/A 01/22/2016   Procedure: Left Heart Cath and Coronary Angiography;  Surgeon: Peter M Martinique, MD;  Location: [place] CV LAB;  Service: Cardiovascular;  Laterality: N/A;  . CARDIAC CATHETERIZATION N/A 01/22/2016   Procedure: Coronary Stent Intervention;  Surgeon: Peter M Martinique, MD;  Location: [place]  CV LAB;  Service: Cardiovascular;  Laterality: N/A;  . CAROTID STENT Left 04/2003   small left parietal and left hemispheric CVA/notes 07/29/2010  . CHOLECYSTECTOMY OPEN    . COLONOSCOPY  2011  . ESOPHAGOGASTRODUODENOSCOPY  08/06/2011   Procedure: ESOPHAGOGASTRODUODENOSCOPY (EGD);  Surgeon: Rogene Houston, MD;  [facility];  Service: Endoscopy;  Laterality: N/A;  . FLEXIBLE SIGMOIDOSCOPY  08/06/2011   Procedure: FLEXIBLE SIGMOIDOSCOPY;  Surgeon: Rogene Houston, MD;  [facility];  Service: Endoscopy;  Laterality: N/A;  . INGUINAL HERNIA REPAIR Left 1970s  . LARYNX SURGERY  1970s   Polyps excised  . MELANOMA EXCISION Left ~ 2016   forearm  . 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   spontaneous rupture   Social History:  reports that she quit smoking about 9 years ago. Her smoking use included Cigarettes. She has a 40.00 pack-year smoking history. She has never used smokeless tobacco. She reports that she does not drink alcohol or use drugs. Patient lives at [place]  . Tape Other (See Comments)    Causes blisters to form  . Latex Rash    Blisters    Family History  Problem Relation Age of Onset  . Heart disease Unknown   . Arthritis Unknown   . Cancer Unknown   . Diabetes Unknown   . Kidney disease Unknown   . Cancer Sister   . Heart failure Brother       Prior to Admission medications   Medication Sig Start Date End Date Taking? Authorizing Provider  albuterol (PROVENTIL) (2.5 MG/3ML) 0.083% nebulizer solution Take 2.5 mg by nebulization every 6 (six) hours as needed for wheezing or shortness of breath.   Yes [provider]  amLODipine (NORVASC) 5 MG tablet Take 1 tablet (5 mg total) by mouth daily. 01/25/16  Yes Arbutus Leas, NP  aspirin EC 81 MG tablet Take 1 tablet (81 mg total) by mouth daily. 01/24/16  Yes Arbutus Leas, NP  atorvastatin (LIPITOR) 80 MG tablet Take 1 tablet (80 mg total) by mouth daily at 6 PM. 01/24/16  Yes Arbutus Leas, NP  carvedilol (COREG) 12.5 MG tablet Take 1 tablet (12.5 mg total) by mouth 2 (two) times daily with a meal. 03/05/16  Yes Branch, Alphonse Guild, MD  cetirizine (ZYRTEC) 10 MG tablet Take 10 mg by mouth daily.   Yes [provider]  clorazepate (TRANXENE) 7.5 MG tablet Take 1 tablet by mouth 3 (three) times  daily. 01/03/16  Yes [provider]  estradiol (ESTRACE) 1 MG tablet Take 1 mg by mouth daily. 07/18/15  Yes [provider]  fenofibrate (TRICOR) 145 MG tablet Take 145 mg by mouth every morning.    Yes [provider]  furosemide (LASIX) 40 MG tablet Take 40 mg by mouth daily as needed for fluid.  07/20/14  Yes [provider]  HYDROcodone-acetaminophen (NORCO) 10-325 MG tablet Take 1 tablet by mouth 4 (four) times daily as needed for moderate pain (Lower back).   Yes [provider]  levothyroxine (SYNTHROID, LEVOTHROID) 50 MCG tablet Take 50 mcg by mouth daily before breakfast.  07/20/14  Yes [provider]  lisinopril (PRINIVIL,ZESTRIL) 40 MG tablet Take 1 tablet (40 mg total) by mouth daily. 02/12/16  Yes Lyda Jester M, PA-C  nitroGLYCERIN (NITROSTAT) 0.4 MG SL tablet Place 0.4 mg under the tongue every 5 (five) minutes as needed.     Yes [provider]  pantoprazole (PROTONIX)  40 MG tablet Take 40 mg by mouth daily. 09/13/15  Yes [provider]  potassium chloride SA (K-DUR,KLOR-CON) 20 MEQ tablet Take 20 mEq by mouth daily as needed (when Lasix 40mg  is taken).  08/19/15  Yes [provider]  PROAIR HFA 108 (90 Base) MCG/ACT inhaler Inhale 1 puff into the lungs every 6 (six) hours as needed for wheezing or shortness of breath.  09/13/15  Yes [provider]  senna (SENOKOT) 8.6 MG tablet Take 1 tablet by mouth daily.   Yes [provider]  temazepam (RESTORIL) 30 MG capsule Take 30 mg by mouth at bedtime as needed for sleep.   Yes [provider]  ticagrelor (BRILINTA) 90 MG TABS tablet Take 1 tablet (90 mg total) by mouth 2 (two) times daily. 01/24/16  Yes Arbutus Leas, NP    Physical Exam: BP (!) 142/78   Pulse 64   Resp 19   Ht 5\' 3"  (1.6 m)   Wt 90.7 kg (200 lb)   SpO2 99%   BMI 35.43 kg/m   General: Elderly Caucasian female. Awake and alert and oriented x3. No acute  cardiopulmonary distress.  HEENT: Normocephalic atraumatic.  Right and left ears normal in appearance.  Pupils equal, round, reactive to light. Extraocular muscles are intact. Sclerae anicteric and noninjected.  Moist mucosal membranes. No mucosal lesions.  Neck: Neck supple without lymphadenopathy. No carotid bruits. No masses palpated.  Cardiovascular: Regular rate with normal S1-S2 sounds. No murmurs, rubs, gallops auscultated. No JVD.  Respiratory: Good respiratory effort with no wheezes, rales, rhonchi. Lungs clear to auscultation bilaterally.  No accessory muscle use. Abdomen: Soft, nontender, nondistended. Active bowel sounds. No masses or hepatosplenomegaly  Skin: No rashes, lesions, or ulcerations.  Dry, warm to touch. 2+ dorsalis pedis and radial pulses. Musculoskeletal: No calf or leg pain. All major joints not erythematous nontender.  No upper or lower joint deformation.  Good ROM.  No contractures  Psychiatric: Intact judgment and insight. Pleasant and cooperative. Neurologic: No focal neurological deficits. Strength is 5/5 and symmetric in upper and lower extremities.  Cranial nerves II through XII are grossly intact.           Labs on Admission: I have personally reviewed following labs and imaging studies  CBC:  Recent Labs Lab 08/21/16 1533  WBC 10.3  NEUTROABS 5.7  HGB 14.6  HCT 43.1  MCV 85.7  PLT 637   Basic Metabolic Panel:  Recent Labs Lab 08/21/16 1533  NA 136  K 3.7  CL 102  CO2 26  GLUCOSE 100*  BUN 11  CREATININE 0.62  CALCIUM 9.3   GFR: Estimated Creatinine Clearance: 73.9 mL/min (by C-G formula based on SCr of 0.62 mg/dL). Liver Function Tests: No results for input(s): AST, ALT, ALKPHOS, BILITOT, PROT, ALBUMIN in the last 168 hours. No results for input(s): LIPASE, AMYLASE in the last 168 hours. No results for input(s): AMMONIA in the last 168 hours. Coagulation Profile: No results for input(s): INR, PROTIME in the last 168 hours. Cardiac  Enzymes:  Recent Labs Lab 08/21/16 1533  TROPONINI <0.03   BNP (last 3 results) No results for input(s): PROBNP in the last 8760 hours. HbA1C: No results for input(s): HGBA1C in the last 72 hours. CBG: No results for input(s): GLUCAP in the last 168 hours. Lipid Profile: No results for input(s): CHOL, HDL, LDLCALC, TRIG, CHOLHDL, LDLDIRECT in the last 72 hours. Thyroid Function Tests: No results for input(s): TSH, T4TOTAL, FREET4, T3FREE, THYROIDAB in the  last 72 hours. Anemia Panel: No results for input(s): VITAMINB12, FOLATE, FERRITIN, TIBC, IRON, RETICCTPCT in the last 72 hours. Urine analysis:    Component Value Date/Time   COLORURINE YELLOW 08/17/2015 1855   APPEARANCEUR CLEAR 08/17/2015 1855   LABSPEC 1.025 08/17/2015 1855   PHURINE 5.5 08/17/2015 1855   GLUCOSEU NEGATIVE 08/17/2015 1855   HGBUR SMALL (A) 08/17/2015 1855   BILIRUBINUR NEGATIVE 08/17/2015 1855   KETONESUR NEGATIVE 08/17/2015 1855   PROTEINUR TRACE (A) 08/17/2015 1855   UROBILINOGEN 0.2 11/03/2014 1722   NITRITE NEGATIVE 08/17/2015 1855   LEUKOCYTESUR TRACE (A) 08/17/2015 1855   Sepsis Labs: @LABRCNTIP (procalcitonin:4,lacticidven:4) )No results found for this or any previous visit (from the past 240 hour(s)).   Radiological Exams on Admission: Dg Chest Port 1 View  Result Date: 08/21/2016 CLINICAL DATA:  Hypotension and bradycardia.  Left arm pain. EXAM: PORTABLE CHEST 1 VIEW COMPARISON:  Two-view chest x-ray 01/21/2016 FINDINGS: The heart size is normal. Aortic atherosclerosis is evident. There is no edema or effusion. No focal airspace disease is present. Scarring at the lung apices bilaterally is stable. The visualized soft tissues and bony thorax are unremarkable. IMPRESSION: No acute cardiopulmonary disease or significant interval change. Electronically Signed   By: San Morelle M.D.   On: 08/21/2016 15:45    EKG: Independently reviewed. Bradycardia with heart rate in the 40s to  50s.  Assessment/Plan: Principal Problem:   Bradycardia Active Problems:   COPD (chronic obstructive pulmonary disease) (HCC)   Hypertension   Hyperlipidemia   GERD (gastroesophageal reflux disease)    This patient was discussed with the ED physician, including pertinent vitals, physical exam findings, labs, and imaging.  We also discussed care given by the ED provider.  #1 bradycardia  Observation on telemetry  Hold beta blocker  We'll watch blood pressure closely. #2 hypertension  Continue amlodipine and lisinopril - patient can't remember if she is still taking these  Hold beta blocker #3 hyperlipidemia  Continue antilipid therapy 4 GERD  Continue PPI #5 COPD  Continue home inhalers  DVT prophylaxis: Lovenox Consultants: None Code Status: Full code Family Communication: None  Disposition Plan: Observation on telemetry. Patient should be able to return home tomorrow with improvement of bradycardia.   Truett Mainland, DO [facility] Pager [phone]  If 7PM-7AM, please contact night-coverage [email or web address] Password [email or web address]

## 2016-08-21 NOTE — ED Triage Notes (Signed)
Pt reports low BP of 94- 60 with heart rate of 44- She reports a stroke in October  Called Dr Karie Kirks who suggested she come here

## 2016-08-22 DIAGNOSIS — I10 Essential (primary) hypertension: Secondary | ICD-10-CM

## 2016-08-22 DIAGNOSIS — R001 Bradycardia, unspecified: Secondary | ICD-10-CM | POA: Diagnosis not present

## 2016-08-22 DIAGNOSIS — J449 Chronic obstructive pulmonary disease, unspecified: Secondary | ICD-10-CM

## 2016-08-22 LAB — BASIC METABOLIC PANEL
ANION GAP: 8 (ref 5–15)
BUN: 11 mg/dL (ref 6–20)
CO2: 26 mmol/L (ref 22–32)
Calcium: 9 mg/dL (ref 8.9–10.3)
Chloride: 102 mmol/L (ref 101–111)
Creatinine, Ser: 0.6 mg/dL (ref 0.44–1.00)
GFR calc Af Amer: >60 mL/min (ref >60)
Glucose, Bld: 124 mg/dL — ABNORMAL HIGH (ref 65–99)
POTASSIUM: 3.3 mmol/L — AB (ref 3.5–5.1)
SODIUM: 136 mmol/L (ref 135–145)

## 2016-08-22 LAB — CBC
HEMATOCRIT: 39.8 % (ref 36.0–46.0)
Hemoglobin: 13.5 g/dL (ref 12.0–15.0)
MCH: 28.8 pg (ref 26.0–34.0)
MCHC: 33.9 g/dL (ref 30.0–36.0)
MCV: 85 fL (ref 78.0–100.0)
Platelets: 330 10*3/uL (ref 150–400)
RBC: 4.68 MIL/uL (ref 3.87–5.11)
RDW: 13.3 % (ref 11.5–15.5)
WBC: 8.9 10*3/uL (ref 4.0–10.5)

## 2016-08-22 MED ORDER — POTASSIUM CHLORIDE CRYS ER 20 MEQ PO TBCR
40.0000 meq | EXTENDED_RELEASE_TABLET | Freq: Every day | ORAL | Status: DC
  Administered 2016-08-22 – 2016-08-23 (×2): 40 meq via ORAL
  Filled 2016-08-22 (×2): qty 2

## 2016-08-22 NOTE — Progress Notes (Signed)
PROGRESS NOTE    [name]  ZTI:[id number] DOB: June 04, 1950 DOA: 08/21/2016 PCP: [name] Evens, MD    Brief Narrative: 66 yo with hx of CAD s/p cardiac stent placement on DUAT with Brillinta and ASA, ? Hx of afib, HTN, HLD, COPD, admitted for symptomatic bradycardia on Coreg.  She has no SOB, CP, or syncopal episode.  Her BP came up to 160, and she was continued on Lisinopril, but her coreg 12.5mg  BID was held.  Her HR came up a little, and she is feeling better.    Assessment & Plan:   Principal Problem:   Bradycardia Active Problems:   COPD (chronic obstructive pulmonary disease) (HCC)   Hypertension   Hyperlipidemia   GERD (gastroesophageal reflux disease)   1. Symptomatic bradycardia:  Continue with telemetry.  OBS with ambulation today.  If HR is corrected, will d/c tomorrow and follow up with Dr [name].  Check TSH. Electrolytes were OK, K was 3.3 mE/L. WIll give supplement.  2. COPD: Stable. 3. HTN:  Avoid negative chronotrophs.  4. HLD:  Will continue with meds.   DVT prophylaxis: Lovenox.  Code Status: FULL CODE.  Family Communication: None.  Disposition Plan: Home.   Consultants:   None.   Procedures:  None.   Antimicrobials: Anti-infectives    None       Subjective:  Patient is feeling a little better.   Objective: Vitals:   08/21/16 1854 08/21/16 1900 08/21/16 2002 08/22/16 0636  BP: (!) 154/90 (!) 142/78 (!) 141/95 (!) 169/80  Pulse: 74 64 64 (!) 44  Resp: (!) 24 19 20 20   Temp:   98.5 F (36.9 C) 98.3 F (36.8 C)  TempSrc:   Oral Oral  SpO2: 97% 99% 97% 100%  Weight:   89.3 kg (196 lb 12.8 oz)   Height:        Intake/Output Summary (Last 24 hours) at 08/22/16 1102 Last data filed at 08/22/16 0900  Gross per 24 hour  Intake              720 ml  Output                2 ml  Net              718 ml   Filed Weights   08/21/16 1500 08/21/16 2002  Weight: 90.7 kg (200 lb) 89.3 kg (196 lb 12.8 oz)    Examination:  General exam:  Appears calm and comfortable  Respiratory system: Clear to auscultation. Respiratory effort normal. Cardiovascular system: S1 & S2 heard, RRR. No JVD, murmurs, rubs, gallops or clicks. No pedal edema. Gastrointestinal system: Abdomen is nondistended, soft and nontender. No organomegaly or masses felt. Normal bowel sounds heard. Central nervous system: Alert and oriented. No focal neurological deficits. Extremities: Symmetric 5 x 5 power. Skin: No rashes, lesions or ulcers Psychiatry: Judgement and insight appear normal. Mood & affect appropriate.   Data Reviewed: I have personally reviewed following labs and imaging studies  CBC:  Recent Labs Lab 08/21/16 1533 08/22/16 0723  WBC 10.3 8.9  NEUTROABS 5.7  --   HGB 14.6 13.5  HCT 43.1 39.8  MCV 85.7 85.0  PLT 362 825   Basic Metabolic Panel:  Recent Labs Lab 08/21/16 1533 08/22/16 0723  NA 136 136  K 3.7 3.3*  CL 102 102  CO2 26 26  GLUCOSE 100* 124*  BUN 11 11  CREATININE 0.62 0.60  CALCIUM 9.3 9.0   GFR: Cardiac Enzymes:  Recent Labs Lab 08/21/16 1533  TROPONINI <0.03   Radiology Studies: Dg Chest Port 1 View  Result Date: 08/21/2016 CLINICAL DATA:  Hypotension and bradycardia.  Left arm pain. EXAM: PORTABLE CHEST 1 VIEW COMPARISON:  Two-view chest x-ray 01/21/2016 FINDINGS: The heart size is normal. Aortic atherosclerosis is evident. There is no edema or effusion. No focal airspace disease is present. Scarring at the lung apices bilaterally is stable. The visualized soft tissues and bony thorax are unremarkable. IMPRESSION: No acute cardiopulmonary disease or significant interval change. Electronically Signed   By: San Morelle M.D.   On: 08/21/2016 15:45    Scheduled Meds: . amLODipine  5 mg Oral Daily  . aspirin EC  81 mg Oral Daily  . atorvastatin  80 mg Oral q1800  . clorazepate  7.5 mg Oral TID  . enoxaparin (LOVENOX) injection  40 mg Subcutaneous Q24H  . estradiol  1 mg Oral Daily  .  fenofibrate  160 mg Oral Daily  . levothyroxine  50 mcg Oral QAC breakfast  . lisinopril  40 mg Oral Daily  . loratadine  10 mg Oral Daily  . pantoprazole  40 mg Oral Daily  . senna  1 tablet Oral Daily  . ticagrelor  90 mg Oral BID   Continuous Infusions:   LOS: 0 days   [name] Mizner, MD FACP Hospitalist.   If 7PM-7AM, please contact night-coverage [email or web address] Password [email or web address] 08/22/2016, 11:02 AM

## 2016-08-22 NOTE — Progress Notes (Deleted)
Discharged to [facility], out in stable condition via w/c with staff to car.

## 2016-08-23 DIAGNOSIS — K219 Gastro-esophageal reflux disease without esophagitis: Secondary | ICD-10-CM | POA: Diagnosis not present

## 2016-08-23 DIAGNOSIS — I4891 Unspecified atrial fibrillation: Secondary | ICD-10-CM | POA: Diagnosis not present

## 2016-08-23 DIAGNOSIS — Z955 Presence of coronary angioplasty implant and graft: Secondary | ICD-10-CM | POA: Diagnosis not present

## 2016-08-23 DIAGNOSIS — Z79899 Other long term (current) drug therapy: Secondary | ICD-10-CM | POA: Diagnosis not present

## 2016-08-23 DIAGNOSIS — Z7982 Long term (current) use of aspirin: Secondary | ICD-10-CM | POA: Diagnosis not present

## 2016-08-23 DIAGNOSIS — Z6834 Body mass index (BMI) 34.0-34.9, adult: Secondary | ICD-10-CM | POA: Diagnosis not present

## 2016-08-23 DIAGNOSIS — Z8673 Personal history of transient ischemic attack (TIA), and cerebral infarction without residual deficits: Secondary | ICD-10-CM | POA: Diagnosis not present

## 2016-08-23 DIAGNOSIS — E785 Hyperlipidemia, unspecified: Secondary | ICD-10-CM | POA: Diagnosis not present

## 2016-08-23 DIAGNOSIS — E039 Hypothyroidism, unspecified: Secondary | ICD-10-CM | POA: Diagnosis not present

## 2016-08-23 DIAGNOSIS — I2510 Atherosclerotic heart disease of native coronary artery without angina pectoris: Secondary | ICD-10-CM | POA: Diagnosis not present

## 2016-08-23 DIAGNOSIS — I252 Old myocardial infarction: Secondary | ICD-10-CM | POA: Diagnosis not present

## 2016-08-23 DIAGNOSIS — Z853 Personal history of malignant neoplasm of breast: Secondary | ICD-10-CM | POA: Diagnosis not present

## 2016-08-23 DIAGNOSIS — Z87891 Personal history of nicotine dependence: Secondary | ICD-10-CM | POA: Diagnosis not present

## 2016-08-23 DIAGNOSIS — E669 Obesity, unspecified: Secondary | ICD-10-CM | POA: Diagnosis not present

## 2016-08-23 DIAGNOSIS — R001 Bradycardia, unspecified: Secondary | ICD-10-CM | POA: Diagnosis present

## 2016-08-23 DIAGNOSIS — I10 Essential (primary) hypertension: Secondary | ICD-10-CM | POA: Diagnosis not present

## 2016-08-23 DIAGNOSIS — Z90710 Acquired absence of both cervix and uterus: Secondary | ICD-10-CM | POA: Diagnosis not present

## 2016-08-23 DIAGNOSIS — J449 Chronic obstructive pulmonary disease, unspecified: Secondary | ICD-10-CM | POA: Diagnosis not present

## 2016-08-23 LAB — BASIC METABOLIC PANEL
ANION GAP: 7 (ref 5–15)
BUN: 10 mg/dL (ref 6–20)
CALCIUM: 9.4 mg/dL (ref 8.9–10.3)
CO2: 27 mmol/L (ref 22–32)
Chloride: 101 mmol/L (ref 101–111)
Creatinine, Ser: 0.58 mg/dL (ref 0.44–1.00)
GLUCOSE: 139 mg/dL — AB (ref 65–99)
POTASSIUM: 4 mmol/L (ref 3.5–5.1)
SODIUM: 135 mmol/L (ref 135–145)

## 2016-08-23 LAB — MAGNESIUM: MAGNESIUM: 1.9 mg/dL (ref 1.7–2.4)

## 2016-08-23 NOTE — Progress Notes (Signed)
[name] discharged Home per MD order.  Discharge instructions reviewed and discussed with the patient, all questions and concerns answered. Copy of instructions given to patient.  Allergies as of 08/23/2016      Reactions   Tape Other (See Comments)   Causes blisters to form   Latex Rash   Blisters      Medication List    STOP taking these medications   carvedilol 12.5 MG tablet Commonly known as:  COREG     TAKE these medications   amLODipine 5 MG tablet Commonly known as:  NORVASC Take 1 tablet (5 mg total) by mouth daily.   aspirin EC 81 MG tablet Take 1 tablet (81 mg total) by mouth daily.   atorvastatin 80 MG tablet Commonly known as:  LIPITOR Take 1 tablet (80 mg total) by mouth daily at 6 PM.   cetirizine 10 MG tablet Commonly known as:  ZYRTEC Take 10 mg by mouth daily.   clorazepate 7.5 MG tablet Commonly known as:  TRANXENE Take 1 tablet by mouth 3 (three) times daily.   estradiol 1 MG tablet Commonly known as:  ESTRACE Take 1 mg by mouth daily.   fenofibrate 145 MG tablet Commonly known as:  TRICOR Take 145 mg by mouth every morning.   furosemide 40 MG tablet Commonly known as:  LASIX Take 40 mg by mouth daily as needed for fluid.   HYDROcodone-acetaminophen 10-325 MG tablet Commonly known as:  NORCO Take 1 tablet by mouth 4 (four) times daily as needed for moderate pain (Lower back).   levothyroxine 50 MCG tablet Commonly known as:  SYNTHROID, LEVOTHROID Take 50 mcg by mouth daily before breakfast.   lisinopril 40 MG tablet Commonly known as:  PRINIVIL,ZESTRIL Take 1 tablet (40 mg total) by mouth daily.   nitroGLYCERIN 0.4 MG SL tablet Commonly known as:  NITROSTAT Place 0.4 mg under the tongue every 5 (five) minutes as needed.   pantoprazole 40 MG tablet Commonly known as:  PROTONIX Take 40 mg by mouth daily.   potassium chloride SA 20 MEQ tablet Commonly known as:  K-DUR,KLOR-CON Take 20 mEq by mouth daily as needed (when  Lasix 40mg  is taken).   albuterol (2.5 MG/3ML) 0.083% nebulizer solution Commonly known as:  PROVENTIL Take 2.5 mg by nebulization every 6 (six) hours as needed for wheezing or shortness of breath.   PROAIR HFA 108 (90 Base) MCG/ACT inhaler Generic drug:  albuterol Inhale 1 puff into the lungs every 6 (six) hours as needed for wheezing or shortness of breath.   senna 8.6 MG tablet Commonly known as:  SENOKOT Take 1 tablet by mouth daily.   temazepam 30 MG capsule Commonly known as:  RESTORIL Take 30 mg by mouth at bedtime as needed for sleep.   ticagrelor 90 MG Tabs tablet Commonly known as:  BRILINTA Take 1 tablet (90 mg total) by mouth 2 (two) times daily.       Patients skin is clean, dry and intact, no evidence of skin break down. IV site discontinued and catheter remains intact. Site without signs and symptoms of complications. Dressing and pressure applied.  Patient escorted to car by NT in a wheelchair,  no distress noted upon discharge.  Ralene Muskrat Mckoy Bhakta 08/23/2016 2:12 PM2

## 2016-08-23 NOTE — Discharge Summary (Signed)
Physician Discharge Summary  [name] OAC:[id number] DOB: 06-07-1950 DOA: 08/21/2016  PCP: Lemmie Evens, MD  Admit date: 08/21/2016 Discharge date: 08/23/2016  Admitted From: Home.  Disposition:  Home.   Recommendations for Outpatient Follow-up:  1. Follow up with PCP in 1-2 weeks.  Check TSH.  2. Follow up with Dr Harl Bowie in 1-2 weeks.   Home Health: None.  Equipment/Devices: None.  Discharge Condition: HR is above 65.  Asymptomatic.  CODE STATUS: FULL CODE.  Diet recommendation: cardiac diet.   Brief/Interim Summary: Patient was admitted by Dr Nehemiah Settle on August 21, 2016 for symptomatic bradycardia on BB.  As per his H and P:  " [name] is a 66 y.o. 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 Patient woke up this morning with nausea dizziness, and was found to have low blood pressure and heart rate in the 40s. She came to emergency department for evaluation after contacting her primary care physician who recommended she be evaluated. Her blood pressures gradually improved since being in the emergency department.  Emergency Department Course: Records been relatively normal. Cardiology consulted by ER physician, who recommended observation.  HOSPITAL COURSE:  Patient was admitted into telemetry and monitor.  Her DUAT was continued for her hx of cardiac disease s/p stent placement.  Her Coreg was discontinued, and she was observed on telemetry for 2 days.  She felt better, and her HR rose to above 65.  Her BP came up to 010 systolic, and her Lisinopril was continued.  She was given Norvasc.  No arrythmia was seen.  Her K and Mag were normal.  TSH was intended to be drawn, but it was not.  She is now stable for discharge.  She will follow up with her PCP  next week, and with her cardiologist Dr Harl Bowie as scheduled.  It seems that she is sensitive to betablocker, and negative ionotrophs likely should be avoided for pure HTN.  Thank you and Good Day.   Discharge Diagnoses:  Principal Problem:   Bradycardia Active Problems:   COPD (chronic obstructive pulmonary disease) (HCC)   Hypertension   Hyperlipidemia   GERD (gastroesophageal reflux disease)   Discharge Instructions  Discharge Instructions    Diet - low sodium heart healthy    Complete by:  As directed    Discharge instructions    Complete by:  As directed    Follow up with your PCP next week and with your heart doctor as scheduled.   Increase activity slowly    Complete by:  As directed      Allergies as of 08/23/2016      Reactions   Tape Other (See Comments)   Causes blisters to form   Latex Rash   Blisters      Medication List    STOP taking these medications   carvedilol 12.5 MG tablet Commonly known as:  COREG     TAKE these medications   amLODipine 5 MG tablet Commonly known as:  NORVASC Take 1 tablet (5 mg total) by mouth daily.   aspirin EC 81 MG tablet Take 1 tablet (81 mg total) by mouth daily.   atorvastatin 80 MG tablet Commonly known as:  LIPITOR Take 1 tablet (80 mg total) by mouth daily at 6 PM.   cetirizine 10  MG tablet Commonly known as:  ZYRTEC Take 10 mg by mouth daily.   clorazepate 7.5 MG tablet Commonly known as:  TRANXENE Take 1 tablet by mouth 3 (three) times daily.   estradiol 1 MG tablet Commonly known as:  ESTRACE Take 1 mg by mouth daily.   fenofibrate 145 MG tablet Commonly known as:  TRICOR Take 145 mg by mouth every morning.   furosemide 40 MG tablet Commonly known as:  LASIX Take 40 mg by mouth daily as needed for fluid.   HYDROcodone-acetaminophen 10-325 MG tablet Commonly known as:  NORCO Take 1 tablet by mouth 4 (four) times daily as needed for moderate pain (Lower back).   levothyroxine 50 MCG  tablet Commonly known as:  SYNTHROID, LEVOTHROID Take 50 mcg by mouth daily before breakfast.   lisinopril 40 MG tablet Commonly known as:  PRINIVIL,ZESTRIL Take 1 tablet (40 mg total) by mouth daily.   nitroGLYCERIN 0.4 MG SL tablet Commonly known as:  NITROSTAT Place 0.4 mg under the tongue every 5 (five) minutes as needed.   pantoprazole 40 MG tablet Commonly known as:  PROTONIX Take 40 mg by mouth daily.   potassium chloride SA 20 MEQ tablet Commonly known as:  K-DUR,KLOR-CON Take 20 mEq by mouth daily as needed (when Lasix 40mg  is taken).   albuterol (2.5 MG/3ML) 0.083% nebulizer solution Commonly known as:  PROVENTIL Take 2.5 mg by nebulization every 6 (six) hours as needed for wheezing or shortness of breath.   PROAIR HFA 108 (90 Base) MCG/ACT inhaler Generic drug:  albuterol Inhale 1 puff into the lungs every 6 (six) hours as needed for wheezing or shortness of breath.   senna 8.6 MG tablet Commonly known as:  SENOKOT Take 1 tablet by mouth daily.   temazepam 30 MG capsule Commonly known as:  RESTORIL Take 30 mg by mouth at bedtime as needed for sleep.   ticagrelor 90 MG Tabs tablet Commonly known as:  BRILINTA Take 1 tablet (90 mg total) by mouth 2 (two) times daily.       Allergies  Allergen Reactions  . Tape Other (See Comments)    Causes blisters to form  . Latex Rash    Blisters    Consultations:  NONE>    Procedures/Studies: Dg Chest Port 1 View  Result Date: 08/21/2016 CLINICAL DATA:  Hypotension and bradycardia.  Left arm pain. EXAM: PORTABLE CHEST 1 VIEW COMPARISON:  Two-view chest x-ray 01/21/2016 FINDINGS: The heart size is normal. Aortic atherosclerosis is evident. There is no edema or effusion. No focal airspace disease is present. Scarring at the lung apices bilaterally is stable. The visualized soft tissues and bony thorax are unremarkable. IMPRESSION: No acute cardiopulmonary disease or significant interval change. Electronically  Signed   By: San Morelle M.D.   On: 08/21/2016 15:45     Subjective: Feeling much better.   Discharge Exam: Vitals:   08/22/16 2208 08/23/16 0555  BP: (!) 151/66 (!) 146/75  Pulse: 62 65  Resp: 20 15  Temp: 98.5 F (36.9 C) 98.7 F (37.1 C)   Vitals:   08/22/16 0636 08/22/16 1347 08/22/16 2208 08/23/16 0555  BP: (!) 169/80 136/74 (!) 151/66 (!) 146/75  Pulse: (!) 44 63 62 65  Resp: 20 18 20 15   Temp: 98.3 F (36.8 C) 98.4 F (36.9 C) 98.5 F (36.9 C) 98.7 F (37.1 C)  TempSrc: Oral Oral Oral Oral  SpO2: 100% 98% 97% 96%  Weight:      Height:  General: Pt is alert, awake, not in acute distress Cardiovascular: RRR, S1/S2 +, no rubs, no gallops Respiratory: CTA bilaterally, no wheezing, no rhonchi Abdominal: Soft, NT, ND, bowel sounds + Extremities: no edema, no cyanosis    The results of significant diagnostics from this hospitalization (including imaging, microbiology, ancillary and laboratory) are listed below for reference.    Basic Metabolic Panel:  Recent Labs Lab 08/21/16 1533 08/22/16 0723 08/23/16 0650  NA 136 136 135  K 3.7 3.3* 4.0  CL 102 102 101  CO2 26 26 27   GLUCOSE 100* 124* 139*  BUN 11 11 10   CREATININE 0.62 0.60 0.58  CALCIUM 9.3 9.0 9.4  MG  --   --  1.9   CBC:  Recent Labs Lab 08/21/16 1533 08/22/16 0723  WBC 10.3 8.9  NEUTROABS 5.7  --   HGB 14.6 13.5  HCT 43.1 39.8  MCV 85.7 85.0  PLT 362 330   Cardiac Enzymes:  Recent Labs Lab 08/21/16 1533  TROPONINI <0.03   Urinalysis    Component Value Date/Time   COLORURINE YELLOW 08/17/2015 1855   APPEARANCEUR CLEAR 08/17/2015 1855   LABSPEC 1.025 08/17/2015 1855   PHURINE 5.5 08/17/2015 1855   GLUCOSEU NEGATIVE 08/17/2015 1855   HGBUR SMALL (A) 08/17/2015 1855   BILIRUBINUR NEGATIVE 08/17/2015 1855   KETONESUR NEGATIVE 08/17/2015 1855   PROTEINUR TRACE (A) 08/17/2015 1855   UROBILINOGEN 0.2 11/03/2014 1722   NITRITE NEGATIVE 08/17/2015 1855    LEUKOCYTESUR TRACE (A) 08/17/2015 1855    Time coordinating discharge: Over 30 minutes SIGNED:  Orvan Falconer, MD FACP [facility] 08/23/2016, 10:54 AM   If 7PM-7AM, please contact night-coverage [email or web address] Password [email or web address]

## 2016-08-26 ENCOUNTER — Ambulatory Visit: Payer: Medicare Other | Admitting: Cardiology

## 2016-08-27 ENCOUNTER — Encounter: Payer: Self-pay | Admitting: Cardiology

## 2016-08-27 ENCOUNTER — Ambulatory Visit (INDEPENDENT_AMBULATORY_CARE_PROVIDER_SITE_OTHER): Payer: Medicare Other | Admitting: Cardiology

## 2016-08-27 VITALS — BP 163/86 | HR 89 | Ht 63.0 in | Wt 199.0 lb

## 2016-08-27 DIAGNOSIS — I5032 Chronic diastolic (congestive) heart failure: Secondary | ICD-10-CM | POA: Diagnosis not present

## 2016-08-27 DIAGNOSIS — I10 Essential (primary) hypertension: Secondary | ICD-10-CM

## 2016-08-27 DIAGNOSIS — I2510 Atherosclerotic heart disease of native coronary artery without angina pectoris: Secondary | ICD-10-CM

## 2016-08-27 DIAGNOSIS — R001 Bradycardia, unspecified: Secondary | ICD-10-CM

## 2016-08-27 MED ORDER — CARVEDILOL 3.125 MG PO TABS: 3.1250 mg | ORAL_TABLET | Freq: Two times a day (BID) | ORAL | 3 refills | Status: DC

## 2016-08-27 NOTE — Patient Instructions (Signed)
Medication Instructions:  START COREG 3.125 MG - TWO TIMES DAILY   Labwork: NONE  Testing/Procedures: NONE  Follow-Up: Your physician recommends that you schedule a follow-up appointment in: 4 MONTHS    Any Other Special Instructions Will Be Listed Below (If Applicable).     If you need a refill on your cardiac medications before your next appointment, please call your pharmacy.

## 2016-08-27 NOTE — Progress Notes (Signed)
Clinical Summary Ms. Tacker is a 66 y.o.female seen today for follow up of the following medical problems.   1. CAD - recent admit 01/2016 with NSTEMI. Full cath report below. Received DES to OM. Unsuccesful PCI to RCA with wire induced dissection, managed medically. The OM was thought to be the culprit. If recurring symptoms can consider repeat RCA intervention. .   - t due to fatigue we lowered her coreg to 12.5mg  bid. Symptoms have resolved.  - no recent chest pain. No significant SOB.  - has upcoming rehab appt  2. Chronic diastolic HF -no recent SOB/DOE, no edema.   3. HTN - home bp 120s/70s. Heart rates 60-80s -  4. Bradycardia - admit 08/2016 with bradycardia - coreg was held and rates improved. Had been on coreg 12.5mg  bid at that time.   5. History of CVA - 2005 at [facility] Past Medical History:  Diagnosis Date  . Anxiety   . Arthritis    "hands, arms, back, neck, knees, fingers" (01/21/2016)  . Atrial fibrillation (HCC)    ASA daiy; came off Coumadin shortly after stroke  . Breast cancer, left breast ([place]) 1970s; 2015  . Cancer of skin of leg    BLE  . 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HCC)    left  . Chronic lower back pain   . Chronic pain   . COPD (chronic obstructive pulmonary disease) ([place])   . Coronary artery disease   . CVA (cerebral vascular accident) [facility]) pt denies right brain CVA   Right brain secondary to right internal carotid artery dissection for which 2 stents were placed;F/u cerebral angiography in 05/2008-minimal plaque; healing of previously identified left internal carotid artery dissection  . Depression   . GERD (gastroesophageal reflux disease)   . Heart murmur   . History of blood transfusion    "related to spleen"  . History of hiatal hernia   . History of kidney stones   . Hyperlipidemia   . Hypertension   . Hypothyroidism   . Kidney stone   . Melanoma of forearm, left ([place])   . Migraine    "a few migraines/year" (01/21/2016)   . NSTEMI (non-ST elevated myocardial infarction) ([place]) 01/21/2016   Archie Endo 01/21/2016  . Obesity   . Stroke [facility]) 04/2003   2 carotid artery stents in place; small left parietal and left hemispheric CVA.Phillis Knack 01/21/2016  . Tobacco use disorder    50 pack years; questionably discontinued in 2010  . Wears glasses      Allergies  Allergen Reactions  . Tape Other (See Comments)    Causes blisters to form  . Latex Rash    Blisters     Current Outpatient Prescriptions  Medication Sig Dispense Refill  . albuterol (PROVENTIL) (2.5 MG/3ML) 0.083% nebulizer solution Take 2.5 mg by nebulization every 6 (six) hours as needed for wheezing or shortness of breath.    Marland Kitchen amLODipine (NORVASC) 5 MG tablet Take 1 tablet (5 mg total) by mouth daily. 30 tablet 12  . aspirin EC 81 MG tablet Take 1 tablet (81 mg total) by mouth daily.    Marland Kitchen atorvastatin (LIPITOR) 80 MG tablet Take 1 tablet (80 mg total) by mouth daily at 6 PM. 30 tablet 12  . cetirizine (ZYRTEC) 10 MG tablet Take 10 mg by mouth daily.    . clorazepate (TRANXENE) 7.5 MG tablet Take 1 tablet by mouth 3 (three) times daily.    Marland Kitchen estradiol (ESTRACE) 1 MG tablet Take 1  mg by mouth daily.    . fenofibrate (TRICOR) 145 MG tablet Take 145 mg by mouth every morning.     . furosemide (LASIX) 40 MG tablet Take 40 mg by mouth daily as needed for fluid.     Marland Kitchen HYDROcodone-acetaminophen (NORCO) 10-325 MG tablet Take 1 tablet by mouth 4 (four) times daily as needed for moderate pain (Lower back).    Marland Kitchen levothyroxine (SYNTHROID, LEVOTHROID) 50 MCG tablet Take 50 mcg by mouth daily before breakfast.     . lisinopril (PRINIVIL,ZESTRIL) 40 MG tablet Take 1 tablet (40 mg total) by mouth daily. 30 tablet 11  . nitroGLYCERIN (NITROSTAT) 0.4 MG SL tablet Place 0.4 mg under the tongue every 5 (five) minutes as needed.      . pantoprazole (PROTONIX) 40 MG tablet Take 40 mg by mouth daily.    . potassium chloride SA (K-DUR,KLOR-CON) 20 MEQ tablet Take 20 mEq by  mouth daily as needed (when Lasix 40mg  is taken).     Marland Kitchen PROAIR HFA 108 (90 Base) MCG/ACT inhaler Inhale 1 puff into the lungs every 6 (six) hours as needed for wheezing or shortness of breath.     . senna (SENOKOT) 8.6 MG tablet Take 1 tablet by mouth daily.    . temazepam (RESTORIL) 30 MG capsule Take 30 mg by mouth at bedtime as needed for sleep.    . ticagrelor (BRILINTA) 90 MG TABS tablet Take 1 tablet (90 mg total) by mouth 2 (two) times daily. 60 tablet 11   No current facility-administered medications for this visit.      Past Surgical History:  Procedure Laterality Date  . ABDOMINAL HYSTERECTOMY  1978  . APPENDECTOMY    . BREAST BIOPSY Left 1970s; 2015  . BREAST LUMPECTOMY Left 1970s  . BREAST LUMPECTOMY WITH NEEDLE LOCALIZATION Left 07/07/2013   Procedure: BREAST LUMPECTOMY WITH NEEDLE LOCALIZATION;  Surgeon: Merrie Roof, MD;  Location: [place];  Service: General;  Laterality: Left;  . CARDIAC CATHETERIZATION N/A 01/22/2016   Procedure: Left Heart Cath and Coronary Angiography;  Surgeon: Peter M Martinique, MD;  Location: [place] [facility];  Service: Cardiovascular;  Laterality: N/A;  . CARDIAC CATHETERIZATION N/A 01/22/2016   Procedure: Coronary Stent Intervention;  Surgeon: Peter M Martinique, MD;  Location: [place] [facility];  Service: Cardiovascular;  Laterality: N/A;  . CAROTID STENT Left 04/2003   small left parietal and left hemispheric CVA/notes 07/29/2010  . CHOLECYSTECTOMY OPEN    . COLONOSCOPY  2011  . ESOPHAGOGASTRODUODENOSCOPY  08/06/2011   Procedure: ESOPHAGOGASTRODUODENOSCOPY (EGD);  Surgeon: Rogene Houston, MD;  [facility];  Service: Endoscopy;  Laterality: N/A;  . FLEXIBLE SIGMOIDOSCOPY  08/06/2011   Procedure: FLEXIBLE SIGMOIDOSCOPY;  Surgeon: Rogene Houston, MD;  [facility];  Service: Endoscopy;  Laterality: N/A;  . INGUINAL HERNIA REPAIR Left 1970s  . LARYNX SURGERY  1970s   Polyps excised  . MELANOMA EXCISION Left ~  2016   forearm  . 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 Tape Other (See Comments)    Causes blisters to form  . Latex Rash    Blisters      Family History  Problem Relation Age of Onset  . Heart disease Unknown   . Arthritis Unknown   . Cancer Unknown   . Diabetes Unknown   . Kidney disease Unknown   . Cancer Sister   . Heart  failure Brother      Social History Ms. Denbow reports that she quit smoking about 9 years ago. Her smoking use included Cigarettes. She has a 40.00 pack-year smoking history. She has never used smokeless tobacco. Ms. Yono reports that she does not drink alcohol.   Review of Systems CONSTITUTIONAL: No weight loss, fever, chills, weakness or fatigue.  HEENT: Eyes: No visual loss, blurred vision, double vision or yellow sclerae.No hearing loss, sneezing, congestion, runny nose or sore throat.  SKIN: No rash or itching.  CARDIOVASCULAR: per hpi RESPIRATORY: No shortness of breath, cough or sputum.  GASTROINTESTINAL: No anorexia, nausea, vomiting or diarrhea. No abdominal pain or blood.  GENITOURINARY: No burning on urination, no polyuria NEUROLOGICAL: No headache, dizziness, syncope, paralysis, ataxia, numbness or tingling in the extremities. No change in bowel or bladder control.  MUSCULOSKELETAL: No muscle, back pain, joint pain or stiffness.  LYMPHATICS: No enlarged nodes. No history of splenectomy.  PSYCHIATRIC: No history of depression or anxiety.  ENDOCRINOLOGIC: No reports of sweating, cold or heat intolerance. No polyuria or polydipsia.  Marland Kitchen   Physical Examination Vitals:   08/27/16 1530  BP: (!) 163/86  Pulse: 89   Vitals:   08/27/16 1530  Weight: 199 lb (90.3 kg)  Height: 5\' 3"  (1.6 m)    Gen: resting comfortably, no acute distress HEENT: no scleral icterus, pupils equal round and reactive, no palptable cervical adenopathy,  CV: RRR, no m/r/g, no jvd Resp: Clear to  auscultation bilaterally GI: abdomen is soft, non-tender, non-distended, normal bowel sounds, no hepatosplenomegaly MSK: extremities are warm, no edema.  Skin: warm, no rash Neuro:  no focal deficits Psych: appropriate affect   Diagnostic Studies 01/2016 cath  The left ventricular systolic function is normal.  LV end diastolic pressure is normal.  The left ventricular ejection fraction is 55-65% by visual estimate.  Prox RCA lesion, 90 %stenosed.  1st Diag lesion, 60 %stenosed.  Prox LAD to Mid LAD lesion, 20 %stenosed.  2nd Diag lesion, 40 %stenosed.  Ost Ramus to Ramus lesion, 50 %stenosed.  1st Mrg lesion, 99 %stenosed.  A STENT SYNERGY DES 2.75X20 drug eluting stent was successfully placed.  Post intervention, there is a 0% residual stenosis.  Ost RCA to Prox RCA lesion, 90 %stenosed.  Post intervention, there is a 90% residual stenosis with dissection from the proximal to mid RCA.  1. Severe 2 vessel obstructive CAD 2. Normal LV function and LV EDP 3. Successful stenting of the first OM with DES. This appears to be the culprit vessel. 4. Unsuccessful PCI of the RCA due to wire induced dissection of the vessel.   Plan: DAPT for one year. Aggressive BP control. Will continue IV Ntg overnight. Despite RCA dissection patient is pain free, hemodynamically stable and has a normal Ecg. If her symptoms remain stable I would treat medically and allow the RCA to heal. If she continues to have angina attempt at PCI of the RCA could be considered once the vessel has had time to heal - 6-8 weeks.    01/2016 echo Study Conclusions  - Left ventricle: The cavity size was normal. Systolic function was vigorous. The estimated ejection fraction was in the range of 65% to 70%. Wall motion was normal; there were no regional wall motion abnormalities. There was an increased relative contribution of atrial contraction to ventricular filling. Doppler parameters are  consistent with abnormal left ventricular relaxation (grade 1 diastolic dysfunction). Doppler parameters are consistent with high ventricular filling pressure. - Tricuspid valve:  There was trivial regurgitation. - Pulmonary arteries: PA peak pressure: 32 mm Hg (S). - Pericardium, extracardiac: A trivial, free-flowing pericardial effusion was identified along the right ventricular free wall. The fluid had no internal echoes.    Assessment and Plan  1. CAD - no current symptoms - continue current meds - restart low dose coreg 3.125mg  bid in setting of CAD and prior MI, avoid higher doses due to prior bradycardia  2. Chronic diastolic HF - she appears euvolemic - continue current meds  3. HTN - elevated in clinic.  - we will start coreg as described above  4. Bradycardia - related to coreg 12.5mg  bid, resolved off medicine - restart low dose coreg only, monitor how she tolerates.    F/u 4 months      Arnoldo Lenis, M.D.

## 2016-09-28 ENCOUNTER — Ambulatory Visit: Payer: Medicare Other | Admitting: Cardiology

## 2016-12-28 ENCOUNTER — Ambulatory Visit: Payer: Medicare Other | Admitting: Cardiology

## 2016-12-28 NOTE — Progress Notes (Deleted)
Clinical Summary Ms. Driver is a 66 y.o.female seen today for follow up of the following medical problems.   1. CAD - recent admit 01/2016 with NSTEMI. Full cath report below. Received DES to OM. Unsuccesful PCI to RCA with wire induced dissection, managed medically. The OM was thought to be the culprit. If recurring symptoms can consider repeat RCA intervention. .   - due to fatigue we lowered her coreg to 12.5mg  bid. Symptoms have resolved.  - no recent chest pain. No significant SOB.  - has upcoming rehab appt  - ?stop DAPT 01/2017?   2. Chronic diastolic HF -no recent SOB/DOE, no edema.   3. HTN - home bp 120s/70s. Heart rates 60-80s -  4. Bradycardia - admit 08/2016 with bradycardia - coreg was held and rates improved. Had been on coreg 12.5mg  bid at that time.   - last visit we tried starting low dose coreg 3.125mg  bid    5. History of CVA - 2005 at [facility]   Past Medical History:  Diagnosis Date  . Anxiety   . Arthritis    "hands, arms, back, neck, knees, fingers" (01/21/2016)  . Atrial fibrillation (HCC)    ASA daiy; came off Coumadin shortly after stroke  . Breast cancer, left breast ([place]) 1970s; 2015  . Cancer of skin of leg    BLE  . 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HCC)    left  . Chronic lower back pain   . Chronic pain   . COPD (chronic obstructive pulmonary disease) ([place])   . Coronary artery disease   . CVA (cerebral vascular accident) [facility]) pt denies right brain CVA   Right brain secondary to right internal carotid artery dissection for which 2 stents were placed;F/u cerebral angiography in 05/2008-minimal plaque; healing of previously identified left internal carotid artery dissection  . Depression   . GERD (gastroesophageal reflux disease)   . Heart murmur   . History of blood transfusion    "related to spleen"  . History of hiatal hernia   . History of kidney stones   . Hyperlipidemia   . Hypertension   . Hypothyroidism   . Kidney  stone   . Melanoma of forearm, left ([place])   . Migraine    "a few migraines/year" (01/21/2016)  . NSTEMI (non-ST elevated myocardial infarction) ([place]) 01/21/2016   Archie Endo 01/21/2016  . Obesity   . Stroke [facility]) 04/2003   2 carotid artery stents in place; small left parietal and left hemispheric CVA.Phillis Knack 01/21/2016  . Tobacco use disorder    50 pack years; questionably discontinued in 2010  . Wears glasses      Allergies  Allergen Reactions  . Tape Other (See Comments)    Causes blisters to form  . Latex Rash    Blisters     Current Outpatient Prescriptions  Medication Sig Dispense Refill  . albuterol (PROVENTIL) (2.5 MG/3ML) 0.083% nebulizer solution Take 2.5 mg by nebulization every 6 (six) hours as needed for wheezing or shortness of breath.    Marland Kitchen amLODipine (NORVASC) 5 MG tablet Take 1 tablet (5 mg total) by mouth daily. 30 tablet 12  . aspirin EC 81 MG tablet Take 1 tablet (81 mg total) by mouth daily.    Marland Kitchen atorvastatin (LIPITOR) 80 MG tablet Take 1 tablet (80 mg total) by mouth daily at 6 PM. 30 tablet 12  . carvedilol (COREG) 3.125 MG tablet Take 1 tablet (3.125 mg total) by mouth 2 (two) times daily with a  meal. 180 tablet 3  . cetirizine (ZYRTEC) 10 MG tablet Take 10 mg by mouth daily.    . clorazepate (TRANXENE) 7.5 MG tablet Take 1 tablet by mouth 3 (three) times daily.    Marland Kitchen estradiol (ESTRACE) 1 MG tablet Take 1 mg by mouth daily.    . fenofibrate (TRICOR) 145 MG tablet Take 145 mg by mouth every morning.     . furosemide (LASIX) 40 MG tablet Take 40 mg by mouth daily as needed for fluid.     Marland Kitchen HYDROcodone-acetaminophen (NORCO) 10-325 MG tablet Take 1 tablet by mouth 4 (four) times daily as needed for moderate pain (Lower back).    Marland Kitchen levothyroxine (SYNTHROID, LEVOTHROID) 50 MCG tablet Take 50 mcg by mouth daily before breakfast.     . lisinopril (PRINIVIL,ZESTRIL) 40 MG tablet Take 1 tablet (40 mg total) by mouth daily. 30 tablet 11  . nitroGLYCERIN (NITROSTAT) 0.4 MG SL  tablet Place 0.4 mg under the tongue every 5 (five) minutes as needed.      . pantoprazole (PROTONIX) 40 MG tablet Take 40 mg by mouth daily.    . potassium chloride SA (K-DUR,KLOR-CON) 20 MEQ tablet Take 20 mEq by mouth daily as needed (when Lasix 40mg  is taken).     Marland Kitchen PROAIR HFA 108 (90 Base) MCG/ACT inhaler Inhale 1 puff into the lungs every 6 (six) hours as needed for wheezing or shortness of breath.     . senna (SENOKOT) 8.6 MG tablet Take 1 tablet by mouth daily.    . temazepam (RESTORIL) 30 MG capsule Take 30 mg by mouth at bedtime as needed for sleep.    . ticagrelor (BRILINTA) 90 MG TABS tablet Take 1 tablet (90 mg total) by mouth 2 (two) times daily. 60 tablet 11   No current facility-administered medications for this visit.      Past Surgical History:  Procedure Laterality Date  . ABDOMINAL HYSTERECTOMY  1978  . APPENDECTOMY    . BREAST BIOPSY Left 1970s; 2015  . BREAST LUMPECTOMY Left 1970s  . BREAST LUMPECTOMY WITH NEEDLE LOCALIZATION Left 07/07/2013   Procedure: BREAST LUMPECTOMY WITH NEEDLE LOCALIZATION;  Surgeon: Merrie Roof, MD;  Location: [facility];  Service: General;  Laterality: Left;  . CARDIAC CATHETERIZATION N/A 01/22/2016   Procedure: Left Heart Cath and Coronary Angiography;  Surgeon: Peter M Martinique, MD;  Location: [facility];  Service: Cardiovascular;  Laterality: N/A;  . CARDIAC CATHETERIZATION N/A 01/22/2016   Procedure: Coronary Stent Intervention;  Surgeon: Peter M Martinique, MD;  Location: [facility];  Service: Cardiovascular;  Laterality: N/A;  . CAROTID STENT Left 04/2003   small left parietal and left hemispheric CVA/notes 07/29/2010  . CHOLECYSTECTOMY OPEN    . COLONOSCOPY  2011  . ESOPHAGOGASTRODUODENOSCOPY  08/06/2011   Procedure: ESOPHAGOGASTRODUODENOSCOPY (EGD);  Surgeon: Rogene Houston, MD;  [facility];  Service: Endoscopy;  Laterality: N/A;  . FLEXIBLE SIGMOIDOSCOPY  08/06/2011   Procedure: FLEXIBLE  SIGMOIDOSCOPY;  Surgeon: Rogene Houston, MD;  [facility];  Service: Endoscopy;  Laterality: N/A;  . INGUINAL HERNIA REPAIR Left 1970s  . LARYNX SURGERY  1970s   Polyps excised  . MELANOMA EXCISION Left ~ 2016   forearm  . 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 Tape Other (See Comments)    Causes blisters to form  . Latex Rash    Blisters  Family History  Problem Relation Age of Onset  . Heart failure Mother   . Cancer Father   . Heart failure Father   . Cancer Sister   . Dementia Sister   . Heart disease Unknown   . Arthritis Unknown   . Cancer Unknown   . Diabetes Unknown   . Kidney disease Unknown   . Cancer Sister   . Heart failure Brother      Social History Ms. Hawkes reports that she quit smoking about 9 years ago. Her smoking use included Cigarettes. She has a 40.00 pack-year smoking history. She has never used smokeless tobacco. Ms. Freitas reports that she does not drink alcohol.   Review of Systems CONSTITUTIONAL: No weight loss, fever, chills, weakness or fatigue.  HEENT: Eyes: No visual loss, blurred vision, double vision or yellow sclerae.No hearing loss, sneezing, congestion, runny nose or sore throat.  SKIN: No rash or itching.  CARDIOVASCULAR:  RESPIRATORY: No shortness of breath, cough or sputum.  GASTROINTESTINAL: No anorexia, nausea, vomiting or diarrhea. No abdominal pain or blood.  GENITOURINARY: No burning on urination, no polyuria NEUROLOGICAL: No headache, dizziness, syncope, paralysis, ataxia, numbness or tingling in the extremities. No change in bowel or bladder control.  MUSCULOSKELETAL: No muscle, back pain, joint pain or stiffness.  LYMPHATICS: No enlarged nodes. No history of splenectomy.  PSYCHIATRIC: No history of depression or anxiety.  ENDOCRINOLOGIC: No reports of sweating, cold or heat intolerance. No polyuria or polydipsia.  Marland Kitchen   Physical Examination There  were no vitals filed for this visit. There were no vitals filed for this visit.  Gen: resting comfortably, no acute distress HEENT: no scleral icterus, pupils equal round and reactive, no palptable cervical adenopathy,  CV Resp: Clear to auscultation bilaterally GI: abdomen is soft, non-tender, non-distended, normal bowel sounds, no hepatosplenomegaly MSK: extremities are warm, no edema.  Skin: warm, no rash Neuro:  no focal deficits Psych: appropriate affect   Diagnostic Studies  01/2016 cath  The left ventricular systolic function is normal.  LV end diastolic pressure is normal.  The left ventricular ejection fraction is 55-65% by visual estimate.  Prox RCA lesion, 90 %stenosed.  1st Diag lesion, 60 %stenosed.  Prox LAD to Mid LAD lesion, 20 %stenosed.  2nd Diag lesion, 40 %stenosed.  Ost Ramus to Ramus lesion, 50 %stenosed.  1st Mrg lesion, 99 %stenosed.  A STENT SYNERGY DES 2.75X20 drug eluting stent was successfully placed.  Post intervention, there is a 0% residual stenosis.  Ost RCA to Prox RCA lesion, 90 %stenosed.  Post intervention, there is a 90% residual stenosis with dissection from the proximal to mid RCA.  1. Severe 2 vessel obstructive CAD 2. Normal LV function and LV EDP 3. Successful stenting of the first OM with DES. This appears to be the culprit vessel. 4. Unsuccessful PCI of the RCA due to wire induced dissection of the vessel.   Plan: DAPT for one year. Aggressive BP control. Will continue IV Ntg overnight. Despite RCA dissection patient is pain free, hemodynamically stable and has a normal Ecg. If her symptoms remain stable I would treat medically and allow the RCA to heal. If she continues to have angina attempt at PCI of the RCA could be considered once the vessel has had time to heal - 6-8 weeks.    01/2016 echo Study Conclusions  - Left ventricle: The cavity size was normal. Systolic function was vigorous. The estimated  ejection fraction was in the range of 65% to 70%.  Wall motion was normal; there were no regional wall motion abnormalities. There was an increased relative contribution of atrial contraction to ventricular filling. Doppler parameters are consistent with abnormal left ventricular relaxation (grade 1 diastolic dysfunction). Doppler parameters are consistent with high ventricular filling pressure. - Tricuspid valve: There was trivial regurgitation. - Pulmonary arteries: PA peak pressure: 32 mm Hg (S). - Pericardium, extracardiac: A trivial, free-flowing pericardial effusion was identified along the right ventricular free wall. The fluid had no internal echoes.   Assessment and Plan   1. CAD - no current symptoms - continue current meds - restart low dose coreg 3.125mg  bid in setting of CAD and prior MI, avoid higher doses due to prior bradycardia  2. Chronic diastolic HF - she appears euvolemic - continue current meds  3. HTN - elevated in clinic.  - we will start coreg as described above  4. Bradycardia - related to coreg 12.5mg  bid, resolved off medicine - restart low dose coreg only, monitor how she tolerates      Arnoldo Lenis, M.D., F.A.C.C.

## 2017-01-06 ENCOUNTER — Other Ambulatory Visit (HOSPITAL_COMMUNITY): Payer: Self-pay | Admitting: Family Medicine

## 2017-01-06 DIAGNOSIS — Z1231 Encounter for screening mammogram for malignant neoplasm of breast: Secondary | ICD-10-CM

## 2017-01-18 ENCOUNTER — Ambulatory Visit (HOSPITAL_COMMUNITY)
Admission: RE | Admit: 2017-01-18 | Discharge: 2017-01-18 | Disposition: A | Discharge status: Home / Self Care | Payer: Medicare Other | Source: Ambulatory Visit | Attending: Family Medicine | Admitting: Family Medicine

## 2017-01-18 ENCOUNTER — Encounter (HOSPITAL_COMMUNITY): Payer: Self-pay

## 2017-01-18 DIAGNOSIS — Z1231 Encounter for screening mammogram for malignant neoplasm of breast: Secondary | ICD-10-CM | POA: Insufficient documentation

## 2017-01-20 ENCOUNTER — Ambulatory Visit (INDEPENDENT_AMBULATORY_CARE_PROVIDER_SITE_OTHER): Payer: Medicare Other | Admitting: Cardiology

## 2017-01-20 ENCOUNTER — Encounter: Payer: Self-pay | Admitting: *Deleted

## 2017-01-20 VITALS — BP 138/80 | HR 66 | Ht 61.0 in | Wt 199.4 lb

## 2017-01-20 DIAGNOSIS — I2510 Atherosclerotic heart disease of native coronary artery without angina pectoris: Secondary | ICD-10-CM

## 2017-01-20 DIAGNOSIS — I10 Essential (primary) hypertension: Secondary | ICD-10-CM

## 2017-01-20 DIAGNOSIS — I5032 Chronic diastolic (congestive) heart failure: Secondary | ICD-10-CM | POA: Diagnosis not present

## 2017-01-20 DIAGNOSIS — E782 Mixed hyperlipidemia: Secondary | ICD-10-CM | POA: Diagnosis not present

## 2017-01-20 MED ORDER — AMLODIPINE BESYLATE 10 MG PO TABS: 10.0000 mg | ORAL_TABLET | Freq: Every day | ORAL | 3 refills | Status: DC

## 2017-01-20 NOTE — Progress Notes (Signed)
Clinical Summary Ms. Dante is a 66 y.o.female seen today for follow up of the following medical problems.   1. CAD - admit 01/2016 with NSTEMI. Full cath report below. Received DES to OM. Unsuccesful PCI to RCA with wire induced dissection, managed medically. The OM was thought to be the culprit. If recurring symptoms can consider repeat RCA intervention. .   - left arm pain, occasional chest tightness. Mainly occurs with laying down. Can get SOB, feels weak.  - she reports chronic history of left arm tendinitis - symptoms last about 15-20 minutes. Better with NG, resolved after 5 minutes after taking - 3 episodes over the last month - no significant exertional symptoms.  2. Chronic diastolic HF -no recent SOB/DOE, no edema.   3. HTN - home bp 120s/70s. Heart rates 60-80s -  4. Bradycardia - admit 08/2016 with bradycardia - coreg was held and rates improved. Had been on coreg 12.5mg  bid at that time.   - restarted low dose coreg 3.125mg  bid which she is tolerating.   5. History of CVA - 2005 at [facility] - she reports history of stents at the time   6. LE edema  - R>L, takes fluid pill  as needed.  - takes lasix about 3-4 times a week - works to limit to sodium intake  7. Hyperlipedemia - Jan 2018 TC 119 TG 99 HDL 35 LDL 64  8. Obesity - working on dietary changes, exercise.   9. HTN - compliant with meds   Past Medical History:  Diagnosis Date  . Anxiety   . Arthritis    "hands, arms, back, neck, knees, fingers" (01/21/2016)  . Atrial fibrillation (HCC)    ASA daiy; came off Coumadin shortly after stroke  . Breast cancer, left breast ([place]) 1970s; 2015  . Cancer of skin of leg    BLE  . 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HCC)    left  . Chronic lower back pain   . Chronic pain   . COPD (chronic obstructive pulmonary disease) ([place])   . Coronary artery disease   . CVA (cerebral vascular accident) [facility]) pt denies right brain CVA   Right brain secondary to  right internal carotid artery dissection for which 2 stents were placed;F/u cerebral angiography in 05/2008-minimal plaque; healing of previously identified left internal carotid artery dissection  . Depression   . GERD (gastroesophageal reflux disease)   . Heart murmur   . History of blood transfusion    "related to spleen"  . History of hiatal hernia   . History of kidney stones   . Hyperlipidemia   . Hypertension   . Hypothyroidism   . Kidney stone   . Melanoma of forearm, left ([place])   . Migraine    "a few migraines/year" (01/21/2016)  . NSTEMI (non-ST elevated myocardial infarction) ([place]) 01/21/2016   Archie Endo 01/21/2016  . Obesity   . Stroke [facility]) 04/2003   2 carotid artery stents in place; small left parietal and left hemispheric CVA.Phillis Knack 01/21/2016  . Tobacco use disorder    50 pack years; questionably discontinued in 2010  . Wears glasses      Allergies  Allergen Reactions  . Tape Other (See Comments)    Causes blisters to form  . Latex Rash    Blisters     Current Outpatient Medications  Medication Sig Dispense Refill  . albuterol (PROVENTIL) (2.5 MG/3ML) 0.083% nebulizer solution Take 2.5 mg by nebulization every 6 (six) hours as needed for  wheezing or shortness of breath.    Marland Kitchen amLODipine (NORVASC) 5 MG tablet Take 1 tablet (5 mg total) by mouth daily. 30 tablet 12  . aspirin EC 81 MG tablet Take 1 tablet (81 mg total) by mouth daily.    Marland Kitchen atorvastatin (LIPITOR) 80 MG tablet Take 1 tablet (80 mg total) by mouth daily at 6 PM. 30 tablet 12  . carvedilol (COREG) 3.125 MG tablet Take 1 tablet (3.125 mg total) by mouth 2 (two) times daily with a meal. 180 tablet 3  . cetirizine (ZYRTEC) 10 MG tablet Take 10 mg by mouth daily.    . clorazepate (TRANXENE) 7.5 MG tablet Take 1 tablet by mouth 3 (three) times daily.    Marland Kitchen estradiol (ESTRACE) 1 MG tablet Take 1 mg by mouth daily.    . fenofibrate (TRICOR) 145 MG tablet Take 145 mg by mouth every morning.     . furosemide  (LASIX) 40 MG tablet Take 40 mg by mouth daily as needed for fluid.     Marland Kitchen HYDROcodone-acetaminophen (NORCO) 10-325 MG tablet Take 1 tablet by mouth 4 (four) times daily as needed for moderate pain (Lower back).    Marland Kitchen levothyroxine (SYNTHROID, LEVOTHROID) 50 MCG tablet Take 50 mcg by mouth daily before breakfast.     . lisinopril (PRINIVIL,ZESTRIL) 40 MG tablet Take 1 tablet (40 mg total) by mouth daily. 30 tablet 11  . nitroGLYCERIN (NITROSTAT) 0.4 MG SL tablet Place 0.4 mg under the tongue every 5 (five) minutes as needed.      . pantoprazole (PROTONIX) 40 MG tablet Take 40 mg by mouth daily.    . potassium chloride SA (K-DUR,KLOR-CON) 20 MEQ tablet Take 20 mEq by mouth daily as needed (when Lasix 40mg  is taken).     Marland Kitchen PROAIR HFA 108 (90 Base) MCG/ACT inhaler Inhale 1 puff into the lungs every 6 (six) hours as needed for wheezing or shortness of breath.     . senna (SENOKOT) 8.6 MG tablet Take 1 tablet by mouth daily.    . temazepam (RESTORIL) 30 MG capsule Take 30 mg by mouth at bedtime as needed for sleep.    . ticagrelor (BRILINTA) 90 MG TABS tablet Take 1 tablet (90 mg total) by mouth 2 (two) times daily. 60 tablet 11   No current facility-administered medications for this visit.      Past Surgical History:  Procedure Laterality Date  . ABDOMINAL HYSTERECTOMY  1978  . APPENDECTOMY    . BREAST BIOPSY Left 1970s; 2015  . BREAST LUMPECTOMY Left 1970s  . CAROTID STENT Left 04/2003   small left parietal and left hemispheric CVA/notes 07/29/2010  . CHOLECYSTECTOMY OPEN    . COLONOSCOPY  2011  . INGUINAL HERNIA REPAIR Left 1970s  . LARYNX SURGERY  1970s   Polyps excised  . MELANOMA EXCISION Left ~ 2016   forearm  . 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 Tape Other (See Comments)    Causes blisters to form  . Latex Rash    Blisters      Family History  Problem Relation Age of Onset  . Heart failure Mother   . Cancer Father     . Heart failure Father   . Cancer Sister   . Dementia Sister   . Heart disease Unknown   . Arthritis Unknown   . Cancer Unknown   . Diabetes Unknown   . Kidney disease Unknown   .  Cancer Sister   . Heart failure Brother      Social History Ms. Tungate reports that she quit smoking about 9 years ago. Her smoking use included cigarettes. She has a 40.00 pack-year smoking history. she has never used smokeless tobacco. Ms. Yodice reports that she does not drink alcohol.   Review of Systems CONSTITUTIONAL: No weight loss, fever, chills, weakness or fatigue.  HEENT: Eyes: No visual loss, blurred vision, double vision or yellow sclerae.No hearing loss, sneezing, congestion, runny nose or sore throat.  SKIN: No rash or itching.  CARDIOVASCULAR: per hpi RESPIRATORY: No shortness of breath, cough or sputum.  GASTROINTESTINAL: No anorexia, nausea, vomiting or diarrhea. No abdominal pain or blood.  GENITOURINARY: No burning on urination, no polyuria NEUROLOGICAL: No headache, dizziness, syncope, paralysis, ataxia, numbness or tingling in the extremities. No change in bowel or bladder control.  MUSCULOSKELETAL: No muscle, back pain, joint pain or stiffness.  LYMPHATICS: No enlarged nodes. No history of splenectomy.  PSYCHIATRIC: No history of depression or anxiety.  ENDOCRINOLOGIC: No reports of sweating, cold or heat intolerance. No polyuria or polydipsia.  Marland Kitchen   Physical Examination Vitals:   01/20/17 1442  BP: 138/80  Pulse: 66  SpO2: 96%   Vitals:   01/20/17 1442  Weight: 199 lb 6.4 oz (90.4 kg)  Height: 5\' 1"  (1.549 m)    Gen: resting comfortably, no acute distress HEENT: no scleral icterus, pupils equal round and reactive, no palptable cervical adenopathy,  CV: RRR, no m/rg, no jvd Resp: Clear to auscultation bilaterally GI: abdomen is soft, non-tender, non-distended, normal bowel sounds, no hepatosplenomegaly MSK: extremities are warm, no edema.  Skin: warm, no  rash Neuro:  no focal deficits Psych: appropriate affect   Diagnostic Studies 01/2016 cath  The left ventricular systolic function is normal.  LV end diastolic pressure is normal.  The left ventricular ejection fraction is 55-65% by visual estimate.  Prox RCA lesion, 90 %stenosed.  1st Diag lesion, 60 %stenosed.  Prox LAD to Mid LAD lesion, 20 %stenosed.  2nd Diag lesion, 40 %stenosed.  Ost Ramus to Ramus lesion, 50 %stenosed.  1st Mrg lesion, 99 %stenosed.  A STENT SYNERGY DES 2.75X20 drug eluting stent was successfully placed.  Post intervention, there is a 0% residual stenosis.  Ost RCA to Prox RCA lesion, 90 %stenosed.  Post intervention, there is a 90% residual stenosis with dissection from the proximal to mid RCA.  1. Severe 2 vessel obstructive CAD 2. Normal LV function and LV EDP 3. Successful stenting of the first OM with DES. This appears to be the culprit vessel. 4. Unsuccessful PCI of the RCA due to wire induced dissection of the vessel.   Plan: DAPT for one year. Aggressive BP control. Will continue IV Ntg overnight. Despite RCA dissection patient is pain free, hemodynamically stable and has a normal Ecg. If her symptoms remain stable I would treat medically and allow the RCA to heal. If she continues to have angina attempt at PCI of the RCA could be considered once the vessel has had time to heal - 6-8 weeks.    01/2016 echo Study Conclusions  - Left ventricle: The cavity size was normal. Systolic function was vigorous. The estimated ejection fraction was in the range of 65% to 70%. Wall motion was normal; there were no regional wall motion abnormalities. There was an increased relative contribution of atrial contraction to ventricular filling. Doppler parameters are consistent with abnormal left ventricular relaxation (grade 1 diastolic dysfunction). Doppler parameters are  consistent with high ventricular filling pressure. -  Tricuspid valve: There was trivial regurgitation. - Pulmonary arteries: PA peak pressure: 32 mm Hg (S). - Pericardium, extracardiac: A trivial, free-flowing pericardial effusion was identified along the right ventricular free wall. The fluid had no internal echoes.      Assessment and Plan  1. CAD - recent atypical symptoms, primarily MSK  - continue current meds   2. Chronic diastolic HF - euvolemic, continue current meds   3. HTN - bp above goal, increase norvasc to 10mg  daily  4. Hyperlipidemia - stop fenofibrate, no significant additional benefit   F/u 6 months      Arnoldo Lenis, M.D.

## 2017-01-20 NOTE — Patient Instructions (Signed)
Medication Instructions:  STOP FENOFIBRATE  INCREASE NORVASC TO 10 MG DAILY   Labwork: NONE  Testing/Procedures: NONE  Follow-Up: Your physician recommends that you schedule a follow-up appointment in: 1 MONTH   Any Other Special Instructions Will Be Listed Below (If Applicable).     If you need a refill on your cardiac medications before your next appointment, please call your pharmacy.

## 2017-01-21 ENCOUNTER — Other Ambulatory Visit (HOSPITAL_COMMUNITY): Payer: Self-pay | Admitting: Family Medicine

## 2017-01-21 DIAGNOSIS — N6310 Unspecified lump in the right breast, unspecified quadrant: Secondary | ICD-10-CM

## 2017-01-22 ENCOUNTER — Encounter: Payer: Self-pay | Admitting: Cardiology

## 2017-01-26 ENCOUNTER — Other Ambulatory Visit: Payer: Self-pay | Admitting: Family Medicine

## 2017-01-26 ENCOUNTER — Other Ambulatory Visit: Payer: Self-pay | Admitting: Cardiology

## 2017-01-26 DIAGNOSIS — N6310 Unspecified lump in the right breast, unspecified quadrant: Secondary | ICD-10-CM

## 2017-01-26 MED ORDER — TICAGRELOR 90 MG PO TABS: 90.0000 mg | ORAL_TABLET | Freq: Two times a day (BID) | ORAL | 11 refills | Status: DC

## 2017-01-26 NOTE — Telephone Encounter (Signed)
Patient needs Brillinta sent to [facility]. / tg

## 2017-01-26 NOTE — Telephone Encounter (Signed)
Refill request done. 

## 2017-01-28 ENCOUNTER — Other Ambulatory Visit: Payer: Self-pay | Admitting: Family Medicine

## 2017-01-28 DIAGNOSIS — R928 Other abnormal and inconclusive findings on diagnostic imaging of breast: Secondary | ICD-10-CM

## 2017-02-02 ENCOUNTER — Encounter (HOSPITAL_COMMUNITY): Payer: Self-pay

## 2017-02-02 ENCOUNTER — Encounter (HOSPITAL_COMMUNITY): Payer: Medicare Other

## 2017-02-03 ENCOUNTER — Ambulatory Visit
Admission: RE | Admit: 2017-02-03 | Discharge: 2017-02-03 | Disposition: A | Discharge status: Home / Self Care | Payer: Medicare Other | Source: Ambulatory Visit | Attending: Family Medicine | Admitting: Family Medicine

## 2017-02-03 ENCOUNTER — Ambulatory Visit: Payer: Medicare Other

## 2017-02-03 DIAGNOSIS — R928 Other abnormal and inconclusive findings on diagnostic imaging of breast: Secondary | ICD-10-CM

## 2017-02-11 ENCOUNTER — Other Ambulatory Visit: Payer: Self-pay | Admitting: Cardiology

## 2017-02-19 ENCOUNTER — Ambulatory Visit: Payer: Medicare Other | Admitting: Adult Health

## 2017-02-23 ENCOUNTER — Ambulatory Visit: Payer: Medicare Other | Admitting: Adult Health

## 2017-03-05 ENCOUNTER — Encounter: Payer: Self-pay | Admitting: Adult Health

## 2017-03-05 ENCOUNTER — Ambulatory Visit (INDEPENDENT_AMBULATORY_CARE_PROVIDER_SITE_OTHER): Payer: Medicare Other | Admitting: Adult Health

## 2017-03-05 VITALS — BP 116/72 | HR 82 | Ht 63.0 in | Wt 197.0 lb

## 2017-03-05 DIAGNOSIS — I2510 Atherosclerotic heart disease of native coronary artery without angina pectoris: Secondary | ICD-10-CM

## 2017-03-05 DIAGNOSIS — I10 Essential (primary) hypertension: Secondary | ICD-10-CM | POA: Diagnosis not present

## 2017-03-05 DIAGNOSIS — I5032 Chronic diastolic (congestive) heart failure: Secondary | ICD-10-CM

## 2017-03-05 NOTE — Progress Notes (Signed)
Cardiology Office Note   Date:  03/05/2017   ID:  ERNEST ORR, DOB 04/26/1950, MRN [id number]  PCP:  Lemmie Evens, MD  Cardiologist: Darel Hong, MD No chief complaint on file.    History of Present Illness: [name] is a 66 y.o. female who presents for ongoing assessment and management of coronary artery disease, drug-eluting stent to the OM, unsuccessful PCI to RCA with wire induced dissection.,  Atrial fibrillation, chronic left arm tendinitis, COPD, chronic diastolic heart failure, hypertension, bradycardia with Coreg lowered to 3.125 mg twice daily, lower extremity edema, history of CVA in 2005, and hypertension.  Last visit with Dr. Harl Bowie on 01/20/2017, the patient was complaining of chest discomfort which was felt to be primarily musculoskeletal she was continued on her current medications.  Blood pressure was elevated and amlodipine was increased to 10 mg daily.  She was taken off of fenofibrate as no additional benefit was noted.  She was euvolemic with no changes in diuretic.  She comes today feeling great.  She said her blood pressure is much better controlled and she has more energy.  Her only complaint is dyspnea on exertion which is normal for her with known history of COPD.  She is not oxygen dependent at this time.  She has not been seen by pulmonology yet.  She does not feel her breathing status has worsened to that point.  Past Medical History:  Diagnosis Date  . Anxiety   . Arthritis    "hands, arms, back, neck, knees, fingers" (01/21/2016)  . Atrial fibrillation (HCC)    ASA daiy; came off Coumadin shortly after stroke  . Breast cancer, left breast ([place]) 1970s; 2015  . Cancer of skin of leg    BLE  . 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HCC)    left  . Chronic lower back pain   . Chronic pain   . COPD (chronic obstructive pulmonary disease) ([place])   . Coronary artery disease   . CVA (cerebral vascular accident) [facility]) pt denies right brain CVA   Right  brain secondary to right internal carotid artery dissection for which 2 stents were placed;F/u cerebral angiography in 05/2008-minimal plaque; healing of previously identified left internal carotid artery dissection  . Depression   . GERD (gastroesophageal reflux disease)   . Heart murmur   . History of blood transfusion    "related to spleen"  . History of hiatal hernia   . History of kidney stones   . Hyperlipidemia   . Hypertension   . Hypothyroidism   . Kidney stone   . Melanoma of forearm, left ([place])   . Migraine    "a few migraines/year" (01/21/2016)  . NSTEMI (non-ST elevated myocardial infarction) ([place]) 01/21/2016   Archie Endo 01/21/2016  . Obesity   . Stroke [facility]) 04/2003   2 carotid artery stents in place; small left parietal and left hemispheric CVA.Phillis Knack 01/21/2016  . Tobacco use disorder    50 pack years; questionably discontinued in 2010  . Wears glasses     Past Surgical History:  Procedure Laterality Date  . ABDOMINAL HYSTERECTOMY  1978  . APPENDECTOMY    . BREAST BIOPSY Left 1970s; 2015  . BREAST LUMPECTOMY Left 1970s  . BREAST LUMPECTOMY WITH NEEDLE LOCALIZATION Left 07/07/2013   Procedure: BREAST LUMPECTOMY WITH NEEDLE LOCALIZATION;  Surgeon: Merrie Roof, MD;  Location: [place];  Service: General;  Laterality: Left;  . CARDIAC CATHETERIZATION N/A 01/22/2016   Procedure: Left Heart Cath and Coronary  Angiography;  Surgeon: Peter M Martinique, MD;  Location: [place] CV LAB;  Service: Cardiovascular;  Laterality: N/A;  . CARDIAC CATHETERIZATION N/A 01/22/2016   Procedure: Coronary Stent Intervention;  Surgeon: Peter M Martinique, MD;  Location: [place] CV LAB;  Service: Cardiovascular;  Laterality: N/A;  . CAROTID STENT Left 04/2003   small left parietal and left hemispheric CVA/notes 07/29/2010  . CHOLECYSTECTOMY OPEN    . COLONOSCOPY  2011  . ESOPHAGOGASTRODUODENOSCOPY  08/06/2011   Procedure: ESOPHAGOGASTRODUODENOSCOPY (EGD);  Surgeon: Rogene Houston, MD;  [facility];  Service: Endoscopy;  Laterality: N/A;  . FLEXIBLE SIGMOIDOSCOPY  08/06/2011   Procedure: FLEXIBLE SIGMOIDOSCOPY;  Surgeon: Rogene Houston, MD;  [facility];  Service: Endoscopy;  Laterality: N/A;  . INGUINAL HERNIA REPAIR Left 1970s  . LARYNX SURGERY  1970s   Polyps excised  . MELANOMA EXCISION Left ~ 2016   forearm  . 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   spontaneous rupture     Current Outpatient Medications  Medication Sig Dispense Refill  . albuterol (PROVENTIL) (2.5 MG/3ML) 0.083% nebulizer solution Take 2.5 mg by nebulization every 6 (six) hours as needed for wheezing or shortness of breath.    Marland Kitchen amLODipine (NORVASC) 10 MG tablet Take 1 tablet (10 mg total) daily by mouth. 90 tablet 3  . aspirin EC 81 MG tablet Take 1 tablet (81 mg total) by mouth daily.    Marland Kitchen atorvastatin (LIPITOR) 80 MG tablet Take 1 tablet (80 mg total) by mouth daily at 6 PM. 30 tablet 12  . carvedilol (COREG) 3.125 MG tablet Take 1 tablet (3.125 mg total) by mouth 2 (two) times daily with a meal. 180 tablet 3  . cetirizine (ZYRTEC) 10 MG tablet Take 10 mg by mouth daily.    . clorazepate (TRANXENE) 7.5 MG tablet Take 1 tablet by mouth 3 (three) times daily.    Marland Kitchen estradiol (ESTRACE) 1 MG tablet Take 1 mg by mouth daily.    . furosemide (LASIX) 40 MG tablet Take 40 mg by mouth daily as needed for fluid.     Marland Kitchen HYDROcodone-acetaminophen (NORCO) 10-325 MG tablet Take 1 tablet by mouth 4 (four) times daily as needed for moderate pain (Lower back).    Marland Kitchen levothyroxine (SYNTHROID, LEVOTHROID) 50 MCG tablet Take 50 mcg by mouth daily before breakfast.     . lisinopril (PRINIVIL,ZESTRIL) 40 MG tablet TAKE ONE TABLET BY MOUTH DAILY FOR HIGH BLOOD PRESSURE. 30 tablet 6  . nitroGLYCERIN (NITROSTAT) 0.4 MG SL tablet Place 0.4 mg under the tongue every 5 (five) minutes as needed.      . pantoprazole (PROTONIX) 40 MG tablet Take 40 mg by mouth daily.    . potassium chloride SA  (K-DUR,KLOR-CON) 20 MEQ tablet Take 20 mEq by mouth daily as needed (when Lasix 40mg  is taken).     Marland Kitchen PROAIR HFA 108 (90 Base) MCG/ACT inhaler Inhale 1 puff into the lungs every 6 (six) hours as needed for wheezing or shortness of breath.     . senna (SENOKOT) 8.6 MG tablet Take 1 tablet by mouth daily.    . temazepam (RESTORIL) 30 MG capsule Take 30 mg by mouth at bedtime as needed for sleep.    . ticagrelor (BRILINTA) 90 MG TABS tablet Take 1 tablet (90 mg total) 2 (two) times daily by mouth. 60 tablet 11   No current facility-administered medications for this visit.     Allergies:   Tape and Latex  Social History:  The patient  reports that she quit smoking about 9 years ago. Her smoking use included cigarettes. She has a 40.00 pack-year smoking history. she has never used smokeless tobacco. She reports that she does not drink alcohol or use drugs.   Family History:  The patient's family history includes Arthritis in her unknown relative; Cancer in her father, sister, sister, and unknown relative; Dementia in her sister; Diabetes in her unknown relative; Heart disease in her unknown relative; Heart failure in her brother, father, and mother; Kidney disease in her unknown relative.    ROS: All other systems are reviewed and negative. Unless otherwise mentioned in H&P    PHYSICAL EXAM: VS:  BP 116/72   Pulse 82   Ht 5\' 3"  (1.6 m)   Wt 197 lb (89.4 kg)   SpO2 94%   BMI 34.90 kg/m  , BMI Body mass index is 34.9 kg/m. GEN: Well nourished, well developed, in no acute distress  HEENT: normal  Neck: no JVD, carotid bruits, or masses Cardiac: RRR; no murmurs, rubs, or gallops,no edema  Respiratory: Some crackles in the bases, no wheezes or rhonchi GI: soft, nontender, nondistended, + BS MS: no deformity or atrophy  Skin: warm and dry, no rash Neuro:  Strength and sensation are intact Psych: euthymic mood, full affect   Recent Labs: 08/22/2016: Hemoglobin 13.5; Platelets  330 08/23/2016: BUN 10; Creatinine, Ser 0.58; Magnesium 1.9; Potassium 4.0; Sodium 135    Lipid Panel    Component Value Date/Time   CHOL 119 03/27/2016 1134   TRIG 99 03/27/2016 1134   HDL 35 (L) 03/27/2016 1134   CHOLHDL 3.4 03/27/2016 1134   VLDL 20 03/27/2016 1134   LDLCALC 64 03/27/2016 1134      Wt Readings from Last 3 Encounters:  03/05/17 197 lb (89.4 kg)  01/20/17 199 lb 6.4 oz (90.4 kg)  08/27/16 199 lb (90.3 kg)      Other studies Reviewed: Echocardiogram 25-Jan-2017 Left ventricle: The cavity size was normal. Systolic function was   vigorous. The estimated ejection fraction was in the range of 65%   to 70%. Wall motion was normal; there were no regional wall   motion abnormalities. There was an increased relative   contribution of atrial contraction to ventricular filling.   Doppler parameters are consistent with abnormal left ventricular   relaxation (grade 1 diastolic dysfunction). Doppler parameters   are consistent with high ventricular filling pressure. - Tricuspid valve: There was trivial regurgitation. - Pulmonary arteries: PA peak pressure: 32 mm Hg (S). - Pericardium, extracardiac: A trivial, free-flowing pericardial   effusion was identified along the right ventricular free wall.   The fluid had no internal echoes.   ASSESSMENT AND PLAN:  1.  Hypertension: Better controlled with increased dose of amlodipine to 10 mg daily.  She continues on carvedilol, and lisinopril.  She denies any chest pain or excessive wheezing on beta-blocker.  2.  Chronic diastolic heart failure: Appears euvolemic.  Continue Lasix as directed.  3.  CAD: Stent to OM 1, unable to stent RCA.  She denies recurrent chest pain.  She remains on statin and aspirin therapy along with carvedilol.  4.  COPD: Breathing status is about the same.  She does have some dyspnea on exertion which is not new.  When she rests it goes away.  She occasionally uses inhaler.  Refer her to primary  care for need to be seen by pulmonology when and if clinically warranted.   Current  medicines are reviewed at length with the patient today.    Labs/ tests ordered today include:  Phill Myron. [place] Pugh, ANP, AACC   03/05/2017 2:26 PM    [facility] [address], [place], [place] [address] Phone: [phone]; Fax: [phone]

## 2017-03-05 NOTE — Patient Instructions (Signed)
Medication Instructions:  Your physician recommends that you continue on your current medications as directed. Please refer to the Current Medication list given to you today.   Labwork: NONE   Testing/Procedures: NONE  Follow-Up: Your physician wants you to follow-up in: 6 Months with Dr. Branch. You will receive a reminder letter in the mail two months in advance. If you don't receive a letter, please call our office to schedule the follow-up appointment.   Any Other Special Instructions Will Be Listed Below (If Applicable).     If you need a refill on your cardiac medications before your next appointment, please call your pharmacy. Thank you for choosing [facility]!    

## 2017-03-26 ENCOUNTER — Other Ambulatory Visit: Payer: Self-pay | Admitting: Cardiology

## 2017-03-26 MED ORDER — ATORVASTATIN CALCIUM 80 MG PO TABS: 80.0000 mg | ORAL_TABLET | Freq: Every day | ORAL | 12 refills | Status: DC

## 2017-03-26 NOTE — Telephone Encounter (Signed)
Needs refill on Atorvastatin 80mg  30 day supply to [place]  / tg

## 2017-03-26 NOTE — Telephone Encounter (Signed)
Refilled atorvastatin as asked

## 2017-04-16 ENCOUNTER — Telehealth: Payer: Self-pay | Admitting: Cardiology

## 2017-04-16 NOTE — Telephone Encounter (Signed)
Per phone call from the pt's case worker [name], pt is having some problems. States she's feeling really weak and doing anything just drains her and she has no energy. Please give her a call @ [phone]

## 2017-04-16 NOTE — Telephone Encounter (Signed)
Returned pt call. She complains that she is having "weak spells" several time throughout the days. She states that during these spells, she can be sitting in a chair doing a crossword puzzle or up washing dishes when they happen. They only last for a few minutes and then they go away. They make her feel very weak at times and feel as if she has no energy at all. She did state that she has had to take a few Nitro in the past 3 weeks due to some chest discomfort. She has been having some are pain as well, but feels its related to another problem. She stated she has not had any worsening SOB, nor has she noticed if her heart rates are fast/slow. She does not keep up with her blood pressure at home as she feels it causes her more anxiety. She is wondering if she needs to be seen. Please advise.

## 2017-04-19 NOTE — Telephone Encounter (Signed)
Fairly nonspecific symptoms, she should be evaluated initially by her pcp. If they find a heart cause of her symptoms then we can see her in follow up. Weak spells could be from several other causes a pcp would be best to look at   Zandra Abts MD

## 2017-04-19 NOTE — Telephone Encounter (Signed)
Patient notified and voiced understanding.

## 2017-05-04 ENCOUNTER — Ambulatory Visit (INDEPENDENT_AMBULATORY_CARE_PROVIDER_SITE_OTHER): Payer: Medicare Other | Admitting: Cardiology

## 2017-05-04 ENCOUNTER — Encounter: Payer: Self-pay | Admitting: Cardiology

## 2017-05-04 VITALS — BP 102/62 | HR 75 | Ht 63.0 in | Wt 200.0 lb

## 2017-05-04 DIAGNOSIS — R5383 Other fatigue: Secondary | ICD-10-CM | POA: Diagnosis not present

## 2017-05-04 DIAGNOSIS — I25119 Atherosclerotic heart disease of native coronary artery with unspecified angina pectoris: Secondary | ICD-10-CM

## 2017-05-04 DIAGNOSIS — J449 Chronic obstructive pulmonary disease, unspecified: Secondary | ICD-10-CM | POA: Diagnosis not present

## 2017-05-04 MED ORDER — LISINOPRIL 20 MG PO TABS: 20.0000 mg | ORAL_TABLET | Freq: Every day | ORAL | 1 refills | Status: DC

## 2017-05-04 MED ORDER — TIOTROPIUM BROMIDE MONOHYDRATE 18 MCG IN CAPS: 18.0000 ug | ORAL_CAPSULE | Freq: Every day | RESPIRATORY_TRACT | 6 refills | Status: DC

## 2017-05-04 NOTE — Patient Instructions (Signed)
Your physician recommends that you schedule a follow-up appointment in: [employer]  Your physician has recommended you make the following change in your medication:   DECREASE LISINOPRIL 20 MG DAILY  START SPIRIVA 18 MCG DAILY   Thank you for choosing [facility]!!

## 2017-05-04 NOTE — Progress Notes (Signed)
Clinical Summary [name] is a 67 y.o.female  seen today for follow up of the following medical problems. This is a focused visit on her history of CAD and recent chest pain, for more detailed history please refer to prior clinic notes.   1. CAD - admit 01/2016 with NSTEMI. Full cath report below. Received DES to OM. Unsuccesful PCI to RCA with wire induced dissection, managed medically. The OM was thought to be the culprit. If recurring symptoms can consider repeat RCA intervention. .    - recent chest pain. Nagging/sharp/pressure pain midchest, 5/10 in severity. Can occur at rest or with activity. Better with NG, resolves in 5-6 minutes. +SOB, feels weak. Pain x 3 weeks, about every other day. Not positional. Increased DOE x 3 weeks - started on imdur by pcp, symptoms have resolved.  - still feels weak at times, loses her strength. Reports some bps 90s/50s at times.      Past Medical History:  Diagnosis Date  . Anxiety   . Arthritis    "hands, arms, back, neck, knees, fingers" (01/21/2016)  . Atrial fibrillation (HCC)    ASA daiy; came off Coumadin shortly after stroke  . Breast cancer, left breast ([place]) 1970s; 2015  . Cancer of skin of leg    BLE  . 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HCC)    left  . Chronic lower back pain   . Chronic pain   . COPD (chronic obstructive pulmonary disease) ([place])   . Coronary artery disease   . CVA (cerebral vascular accident) [facility]) pt denies right brain CVA   Right brain secondary to right internal carotid artery dissection for which 2 stents were placed;F/u cerebral angiography in 05/2008-minimal plaque; healing of previously identified left internal carotid artery dissection  . Depression   . GERD (gastroesophageal reflux disease)   . Heart murmur   . History of blood transfusion    "related to spleen"  . History of hiatal hernia   . History of kidney stones   . Hyperlipidemia   . Hypertension   . Hypothyroidism   . Kidney stone   .  Melanoma of forearm, left ([place])   . Migraine    "a few migraines/year" (01/21/2016)  . NSTEMI (non-ST elevated myocardial infarction) ([place]) 01/21/2016   Archie Endo 01/21/2016  . Obesity   . Stroke [facility]) 04/2003   2 carotid artery stents in place; small left parietal and left hemispheric CVA.Phillis Knack 01/21/2016  . Tobacco use disorder    50 pack years; questionably discontinued in 2010  . Wears glasses      Allergies  Allergen Reactions  . Tape Other (See Comments)    Causes blisters to form  . Latex Rash    Blisters     Current Outpatient Medications  Medication Sig Dispense Refill  . albuterol (PROVENTIL) (2.5 MG/3ML) 0.083% nebulizer solution Take 2.5 mg by nebulization every 6 (six) hours as needed for wheezing or shortness of breath.    Marland Kitchen amLODipine (NORVASC) 10 MG tablet Take 1 tablet (10 mg total) daily by mouth. 90 tablet 3  . aspirin EC 81 MG tablet Take 1 tablet (81 mg total) by mouth daily.    Marland Kitchen atorvastatin (LIPITOR) 80 MG tablet Take 1 tablet (80 mg total) by mouth daily at 6 PM. 30 tablet 12  . carvedilol (COREG) 3.125 MG tablet Take 1 tablet (3.125 mg total) by mouth 2 (two) times daily with a meal. 180 tablet 3  . cetirizine (ZYRTEC) 10 MG tablet  Take 10 mg by mouth daily.    . clorazepate (TRANXENE) 7.5 MG tablet Take 1 tablet by mouth 3 (three) times daily.    Marland Kitchen estradiol (ESTRACE) 1 MG tablet Take 1 mg by mouth daily.    . furosemide (LASIX) 40 MG tablet Take 40 mg by mouth daily as needed for fluid.     Marland Kitchen HYDROcodone-acetaminophen (NORCO) 10-325 MG tablet Take 1 tablet by mouth 4 (four) times daily as needed for moderate pain (Lower back).    Marland Kitchen levothyroxine (SYNTHROID, LEVOTHROID) 50 MCG tablet Take 50 mcg by mouth daily before breakfast.     . lisinopril (PRINIVIL,ZESTRIL) 40 MG tablet TAKE ONE TABLET BY MOUTH DAILY FOR HIGH BLOOD PRESSURE. 30 tablet 6  . nitroGLYCERIN (NITROSTAT) 0.4 MG SL tablet Place 0.4 mg under the tongue every 5 (five) minutes as needed.      .  pantoprazole (PROTONIX) 40 MG tablet Take 40 mg by mouth daily.    . potassium chloride SA (K-DUR,KLOR-CON) 20 MEQ tablet Take 20 mEq by mouth daily as needed (when Lasix 40mg  is taken).     Marland Kitchen PROAIR HFA 108 (90 Base) MCG/ACT inhaler Inhale 1 puff into the lungs every 6 (six) hours as needed for wheezing or shortness of breath.     . senna (SENOKOT) 8.6 MG tablet Take 1 tablet by mouth daily.    . temazepam (RESTORIL) 30 MG capsule Take 30 mg by mouth at bedtime as needed for sleep.    . ticagrelor (BRILINTA) 90 MG TABS tablet Take 1 tablet (90 mg total) 2 (two) times daily by mouth. 60 tablet 11   No current facility-administered medications for this visit.      Past Surgical History:  Procedure Laterality Date  . ABDOMINAL HYSTERECTOMY  1978  . APPENDECTOMY    . BREAST BIOPSY Left 1970s; 2015  . BREAST LUMPECTOMY Left 1970s  . BREAST LUMPECTOMY WITH NEEDLE LOCALIZATION Left 07/07/2013   Procedure: BREAST LUMPECTOMY WITH NEEDLE LOCALIZATION;  Surgeon: Merrie Roof, MD;  Location: [place];  Service: General;  Laterality: Left;  . CARDIAC CATHETERIZATION N/A 01/22/2016   Procedure: Left Heart Cath and Coronary Angiography;  Surgeon: Peter M Martinique, MD;  Location: [place] CV LAB;  Service: Cardiovascular;  Laterality: N/A;  . CARDIAC CATHETERIZATION N/A 01/22/2016   Procedure: Coronary Stent Intervention;  Surgeon: Peter M Martinique, MD;  Location: [place] CV [facility];  Service: Cardiovascular;  Laterality: N/A;  . CAROTID STENT Left 04/2003   small left parietal and left hemispheric CVA/notes 07/29/2010  . CHOLECYSTECTOMY OPEN    . COLONOSCOPY  2011  . ESOPHAGOGASTRODUODENOSCOPY  08/06/2011   Procedure: ESOPHAGOGASTRODUODENOSCOPY (EGD);  Surgeon: Rogene Houston, MD;  [facility];  Service: Endoscopy;  Laterality: N/A;  . FLEXIBLE SIGMOIDOSCOPY  08/06/2011   Procedure: FLEXIBLE SIGMOIDOSCOPY;  Surgeon: Rogene Houston, MD;  [facility];  Service:  Endoscopy;  Laterality: N/A;  . INGUINAL HERNIA REPAIR Left 1970s  . LARYNX SURGERY  1970s   Polyps excised  . MELANOMA EXCISION Left ~ 2016   forearm  . 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 Tape Other (See Comments)    Causes blisters to form  . Latex Rash    Blisters      Family History  Problem Relation Age of Onset  . Heart failure Mother   . Cancer Father   . Heart failure Father   .  Cancer Sister   . Dementia Sister   . Heart disease Unknown   . Arthritis Unknown   . Cancer Unknown   . Diabetes Unknown   . Kidney disease Unknown   . Cancer Sister   . Heart failure Brother      Social History Ms. Vaquerano reports that she quit smoking about 9 years ago. Her smoking use included cigarettes. She has a 40.00 pack-year smoking history. she has never used smokeless tobacco. Ms. Mcginnis reports that she does not drink alcohol.   Review of Systems CONSTITUTIONAL: No weight loss, fever, chills, weakness or fatigue.  HEENT: Eyes: No visual loss, blurred vision, double vision or yellow sclerae.No hearing loss, sneezing, congestion, runny nose or sore throat.  SKIN: No rash or itching.  CARDIOVASCULAR: per hpi RESPIRATORY: per hpi GASTROINTESTINAL: No anorexia, nausea, vomiting or diarrhea. No abdominal pain or blood.  GENITOURINARY: No burning on urination, no polyuria NEUROLOGICAL: No headache, dizziness, syncope, paralysis, ataxia, numbness or tingling in the extremities. No change in bowel or bladder control.  MUSCULOSKELETAL: No muscle, back pain, joint pain or stiffness.  LYMPHATICS: No enlarged nodes. No history of splenectomy.  PSYCHIATRIC: No history of depression or anxiety.  ENDOCRINOLOGIC: No reports of sweating, cold or heat intolerance. No polyuria or polydipsia.  Marland Kitchen   Physical Examination Vitals:   05/04/17 1306  BP: 102/62  Pulse: 75  SpO2: 95%   Vitals:   05/04/17 1306  Weight: 200 lb (90.7  kg)  Height: 5\' 3"  (1.6 m)    Gen: resting comfortably, no acute distress HEENT: no scleral icterus, pupils equal round and reactive, no palptable cervical adenopathy,  CV: RRR, no m/r/g, no jvd Resp: Clear to auscultation bilaterally GI: abdomen is soft, non-tender, non-distended, normal bowel sounds, no hepatosplenomegaly MSK: extremities are warm, no edema.  Skin: warm, no rash Neuro:  no focal deficits Psych: appropriate affect   Diagnostic Studies 01/2016 cath  The left ventricular systolic function is normal.  LV end diastolic pressure is normal.  The left ventricular ejection fraction is 55-65% by visual estimate.  Prox RCA lesion, 90 %stenosed.  1st Diag lesion, 60 %stenosed.  Prox LAD to Mid LAD lesion, 20 %stenosed.  2nd Diag lesion, 40 %stenosed.  Ost Ramus to Ramus lesion, 50 %stenosed.  1st Mrg lesion, 99 %stenosed.  A STENT SYNERGY DES 2.75X20 drug eluting stent was successfully placed.  Post intervention, there is a 0% residual stenosis.  Ost RCA to Prox RCA lesion, 90 %stenosed.  Post intervention, there is a 90% residual stenosis with dissection from the proximal to mid RCA.  1. Severe 2 vessel obstructive CAD 2. Normal LV function and LV EDP 3. Successful stenting of the first OM with DES. This appears to be the culprit vessel. 4. Unsuccessful PCI of the RCA due to wire induced dissection of the vessel.   Plan: DAPT for one year. Aggressive BP control. Will continue IV Ntg overnight. Despite RCA dissection patient is pain free, hemodynamically stable and has a normal Ecg. If her symptoms remain stable I would treat medically and allow the RCA to heal. If she continues to have angina attempt at PCI of the RCA could be considered once the vessel has had time to heal - 6-8 weeks.    01/2016 echo Study Conclusions  - Left ventricle: The cavity size was normal. Systolic function was vigorous. The estimated ejection fraction was in the range  of 65% to 70%. Wall motion was normal; there were no regional wall  motion abnormalities. There was an increased relative contribution of atrial contraction to ventricular filling. Doppler parameters are consistent with abnormal left ventricular relaxation (grade 1 diastolic dysfunction). Doppler parameters are consistent with high ventricular filling pressure. - Tricuspid valve: There was trivial regurgitation. - Pulmonary arteries: PA peak pressure: 32 mm Hg (S). - Pericardium, extracardiac: A trivial, free-flowing pericardial effusion was identified along the right ventricular free wall. The fluid had no internal echoes.     Assessment and Plan  1. CAD - recent chest pain symptoms - improved after starting imdur. Likely related to her RCA disease, prior attempt at intervention aborted due to dissection - continue to monitor at this time. If refractory angina consider repeat cath  2. Weakness/fatigue - reports some low bp's at home at times, could be related. We will lower her lisinopril to 20mg  daily.   3. COPD - moderate COPD by 2012 PFTs - she is not on maintence inhaler, only prn albuterol. Recent SOB symptoms.  - start spiriva 28mcg daily    F/u 6 weeks  Arnoldo Lenis, M.D.

## 2017-05-05 ENCOUNTER — Encounter: Payer: Self-pay | Admitting: Cardiology

## 2017-06-09 ENCOUNTER — Other Ambulatory Visit (HOSPITAL_COMMUNITY): Payer: Self-pay | Admitting: Respiratory Therapy

## 2017-06-09 DIAGNOSIS — J441 Chronic obstructive pulmonary disease with (acute) exacerbation: Secondary | ICD-10-CM

## 2017-06-17 ENCOUNTER — Ambulatory Visit: Payer: Medicare Other | Admitting: Cardiology

## 2017-06-18 ENCOUNTER — Ambulatory Visit (INDEPENDENT_AMBULATORY_CARE_PROVIDER_SITE_OTHER): Payer: Medicare Other | Admitting: Cardiology

## 2017-06-18 ENCOUNTER — Encounter: Payer: Self-pay | Admitting: Cardiology

## 2017-06-18 VITALS — BP 118/58 | HR 89 | Wt 199.0 lb

## 2017-06-18 DIAGNOSIS — I2510 Atherosclerotic heart disease of native coronary artery without angina pectoris: Secondary | ICD-10-CM | POA: Diagnosis not present

## 2017-06-18 DIAGNOSIS — E782 Mixed hyperlipidemia: Secondary | ICD-10-CM | POA: Diagnosis not present

## 2017-06-18 DIAGNOSIS — I10 Essential (primary) hypertension: Secondary | ICD-10-CM | POA: Diagnosis not present

## 2017-06-18 DIAGNOSIS — I5032 Chronic diastolic (congestive) heart failure: Secondary | ICD-10-CM | POA: Diagnosis not present

## 2017-06-18 MED ORDER — CARVEDILOL 3.125 MG PO TABS: 3.1250 mg | ORAL_TABLET | Freq: Two times a day (BID) | ORAL | 3 refills | Status: DC

## 2017-06-18 NOTE — Progress Notes (Signed)
Clinical Summary Ms. Garretson is a 67 y.o.female seen today for follow up of the following medical problems.   1. CAD - admit 01/2016 with NSTEMI. Full cath report below. Received DES to OM. Unsuccesful PCI to RCA with wire induced dissection, managed medically. The OM was thought to be the culprit. If recurring symptoms can consider repeat RCA intervention. .   - no recent chest. Resolved with imdur recently - compliant with meds. Only taking coreg 3.125mg  daily.    2. SOB - upcoming PFTs   3. Chronic diastolic HF - occasional LE edema. Takes lasix prn, about 3 times.    4. HTN - home bp's 120s/80s  - she remains compliant with meds   4. Bradycardia - admit 08/2016 with bradycardia - coreg was held and rates improved. Had been on coreg 12.5mg  bidat that time.  - no recent symptoms.   5.History ofCVA - 2005 at [facility] - she reports history of stents at the time    6. Hyperlipedemia - Jan 2018 TC 119 TG 99 HDL 35 LDL 64 - compliant with statin        Past Medical History:  Diagnosis Date  . Anxiety   . Arthritis    "hands, arms, back, neck, knees, fingers" (01/21/2016)  . Atrial fibrillation (HCC)    ASA daiy; came off Coumadin shortly after stroke  . Breast cancer, left breast ([place]) 1970s; 2015  . Cancer of skin of leg    BLE  . 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HCC)    left  . Chronic lower back pain   . Chronic pain   . COPD (chronic obstructive pulmonary disease) ([place])   . Coronary artery disease   . CVA (cerebral vascular accident) [facility]) pt denies right brain CVA   Right brain secondary to right internal carotid artery dissection for which 2 stents were placed;F/u cerebral angiography in 05/2008-minimal plaque; healing of previously identified left internal carotid artery dissection  . Depression   . GERD (gastroesophageal reflux disease)   . Heart murmur   . History of blood transfusion    "related to spleen"  . History of hiatal hernia    . History of kidney stones   . Hyperlipidemia   . Hypertension   . Hypothyroidism   . Kidney stone   . Melanoma of forearm, left ([place])   . Migraine    "a few migraines/year" (01/21/2016)  . NSTEMI (non-ST elevated myocardial infarction) ([place]) 01/21/2016   Archie Endo 01/21/2016  . Obesity   . Stroke [facility]) 04/2003   2 carotid artery stents in place; small left parietal and left hemispheric CVA.Phillis Knack 01/21/2016  . Tobacco use disorder    50 pack years; questionably discontinued in 2010  . Wears glasses      Allergies  Allergen Reactions  . Tape Other (See Comments)    Causes blisters to form  . Latex Rash    Blisters     Current Outpatient Medications  Medication Sig Dispense Refill  . albuterol (PROVENTIL) (2.5 MG/3ML) 0.083% nebulizer solution Take 2.5 mg by nebulization every 6 (six) hours as needed for wheezing or shortness of breath.    Marland Kitchen amLODipine (NORVASC) 10 MG tablet Take 1 tablet (10 mg total) daily by mouth. 90 tablet 3  . aspirin EC 81 MG tablet Take 1 tablet (81 mg total) by mouth daily.    Marland Kitchen atorvastatin (LIPITOR) 80 MG tablet Take 1 tablet (80 mg total) by mouth daily at 6 PM. 30 tablet  12  . carvedilol (COREG) 3.125 MG tablet Take 1 tablet (3.125 mg total) by mouth 2 (two) times daily with a meal. 180 tablet 3  . cetirizine (ZYRTEC) 10 MG tablet Take 10 mg by mouth daily.    . clorazepate (TRANXENE) 7.5 MG tablet Take 1 tablet by mouth 3 (three) times daily.    Marland Kitchen estradiol (ESTRACE) 1 MG tablet Take 1 mg by mouth daily.    . furosemide (LASIX) 40 MG tablet Take 40 mg by mouth daily as needed for fluid.     Marland Kitchen HYDROcodone-acetaminophen (NORCO) 10-325 MG tablet Take 1 tablet by mouth 4 (four) times daily as needed for moderate pain (Lower back).    . isosorbide mononitrate (ISMO,MONOKET) 20 MG tablet Take 20 mg by mouth 2 (two) times daily at 10 AM and 5 PM.    . levothyroxine (SYNTHROID, LEVOTHROID) 50 MCG tablet Take 50 mcg by mouth daily before breakfast.     .  lisinopril (PRINIVIL,ZESTRIL) 20 MG tablet Take 1 tablet (20 mg total) by mouth daily. 90 tablet 1  . nitroGLYCERIN (NITROSTAT) 0.4 MG SL tablet Place 0.4 mg under the tongue every 5 (five) minutes as needed.      . pantoprazole (PROTONIX) 40 MG tablet Take 40 mg by mouth daily.    . potassium chloride SA (K-DUR,KLOR-CON) 20 MEQ tablet Take 20 mEq by mouth daily as needed (when Lasix 40mg  is taken).     Marland Kitchen PROAIR HFA 108 (90 Base) MCG/ACT inhaler Inhale 1 puff into the lungs every 6 (six) hours as needed for wheezing or shortness of breath.     . senna (SENOKOT) 8.6 MG tablet Take 1 tablet by mouth daily.    . temazepam (RESTORIL) 30 MG capsule Take 30 mg by mouth at bedtime as needed for sleep.    . ticagrelor (BRILINTA) 90 MG TABS tablet Take 1 tablet (90 mg total) 2 (two) times daily by mouth. 60 tablet 11  . tiotropium (SPIRIVA HANDIHALER) 18 MCG inhalation capsule Place 1 capsule (18 mcg total) into inhaler and inhale daily. 30 capsule 6   No current facility-administered medications for this visit.      Past Surgical History:  Procedure Laterality Date  . ABDOMINAL HYSTERECTOMY  1978  . APPENDECTOMY    . BREAST BIOPSY Left 1970s; 2015  . BREAST LUMPECTOMY Left 1970s  . BREAST LUMPECTOMY WITH NEEDLE LOCALIZATION Left 07/07/2013   Procedure: BREAST LUMPECTOMY WITH NEEDLE LOCALIZATION;  Surgeon: Merrie Roof, MD;  Location: [place];  Service: General;  Laterality: Left;  . CARDIAC CATHETERIZATION N/A 01/22/2016   Procedure: Left Heart Cath and Coronary Angiography;  Surgeon: Peter M Martinique, MD;  Location: [place] [facility];  Service: Cardiovascular;  Laterality: N/A;  . CARDIAC CATHETERIZATION N/A 01/22/2016   Procedure: Coronary Stent Intervention;  Surgeon: Peter M Martinique, MD;  Location: [place] [facility];  Service: Cardiovascular;  Laterality: N/A;  . CAROTID STENT Left 04/2003   small left parietal and left hemispheric CVA/notes 07/29/2010  . CHOLECYSTECTOMY OPEN     . COLONOSCOPY  2011  . ESOPHAGOGASTRODUODENOSCOPY  08/06/2011   Procedure: ESOPHAGOGASTRODUODENOSCOPY (EGD);  Surgeon: Rogene Houston, MD;  [facility];  Service: Endoscopy;  Laterality: N/A;  . FLEXIBLE SIGMOIDOSCOPY  08/06/2011   Procedure: FLEXIBLE SIGMOIDOSCOPY;  Surgeon: Rogene Houston, MD;  [facility];  Service: Endoscopy;  Laterality: N/A;  . INGUINAL HERNIA REPAIR Left 1970s  . LARYNX SURGERY  1970s  Polyps excised  . MELANOMA EXCISION Left ~ 2016   forearm  . 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 Tape Other (See Comments)    Causes blisters to form  . Latex Rash    Blisters      Family History  Problem Relation Age of Onset  . Heart failure Mother   . Cancer Father   . Heart failure Father   . Cancer Sister   . Dementia Sister   . Heart disease Unknown   . Arthritis Unknown   . Cancer Unknown   . Diabetes Unknown   . Kidney disease Unknown   . Cancer Sister   . Heart failure Brother      Social History Ms. Roylance reports that she quit smoking about 9 years ago. Her smoking use included cigarettes. She has a 40.00 pack-year smoking history. She has never used smokeless tobacco. Ms. Salle reports that she does not drink alcohol.   Review of Systems CONSTITUTIONAL: No weight loss, fever, chills, weakness or fatigue.  HEENT: Eyes: No visual loss, blurred vision, double vision or yellow sclerae.No hearing loss, sneezing, congestion, runny nose or sore throat.  SKIN: No rash or itching.  CARDIOVASCULAR: per hpi RESPIRATORY: No shortness of breath, cough or sputum.  GASTROINTESTINAL: No anorexia, nausea, vomiting or diarrhea. No abdominal pain or blood.  GENITOURINARY: No burning on urination, no polyuria NEUROLOGICAL: No headache, dizziness, syncope, paralysis, ataxia, numbness or tingling in the extremities. No change in bowel or bladder control.  MUSCULOSKELETAL: No muscle,  back pain, joint pain or stiffness.  LYMPHATICS: No enlarged nodes. No history of splenectomy.  PSYCHIATRIC: No history of depression or anxiety.  ENDOCRINOLOGIC: No reports of sweating, cold or heat intolerance. No polyuria or polydipsia.  Marland Kitchen   Physical Examination Vitals:   06/18/17 1020  BP: (!) 118/58  Pulse: 89  SpO2: 95%   Vitals:   06/18/17 1020  Weight: 199 lb (90.3 kg)    Gen: resting comfortably, no acute distress HEENT: no scleral icterus, pupils equal round and reactive, no palptable cervical adenopathy,  CV: RRR, no mr/g, no jvd Resp: Clear to auscultation bilaterally GI: abdomen is soft, non-tender, non-distended, normal bowel sounds, no hepatosplenomegaly MSK: extremities are warm, no edema.  Skin: warm, no rash Neuro:  no focal deficits Psych: appropriate affect   Diagnostic Studies 01/2016 cath  The left ventricular systolic function is normal.  LV end diastolic pressure is normal.  The left ventricular ejection fraction is 55-65% by visual estimate.  Prox RCA lesion, 90 %stenosed.  1st Diag lesion, 60 %stenosed.  Prox LAD to Mid LAD lesion, 20 %stenosed.  2nd Diag lesion, 40 %stenosed.  Ost Ramus to Ramus lesion, 50 %stenosed.  1st Mrg lesion, 99 %stenosed.  A STENT SYNERGY DES 2.75X20 drug eluting stent was successfully placed.  Post intervention, there is a 0% residual stenosis.  Ost RCA to Prox RCA lesion, 90 %stenosed.  Post intervention, there is a 90% residual stenosis with dissection from the proximal to mid RCA.  1. Severe 2 vessel obstructive CAD 2. Normal LV function and LV EDP 3. Successful stenting of the first OM with DES. This appears to be the culprit vessel. 4. Unsuccessful PCI of the RCA due to wire induced dissection of the vessel.   Plan: DAPT for one year. Aggressive BP control. Will continue IV Ntg overnight. Despite RCA dissection patient is pain free, hemodynamically stable and has a normal  Ecg. If her symptoms  remain stable I would treat medically and allow the RCA to heal. If she continues to have angina attempt at PCI of the RCA could be considered once the vessel has had time to heal - 6-8 weeks.    01/2016 echo Study Conclusions  - Left ventricle: The cavity size was normal. Systolic function was vigorous. The estimated ejection fraction was in the range of 65% to 70%. Wall motion was normal; there were no regional wall motion abnormalities. There was an increased relative contribution of atrial contraction to ventricular filling. Doppler parameters are consistent with abnormal left ventricular relaxation (grade 1 diastolic dysfunction). Doppler parameters are consistent with high ventricular filling pressure. - Tricuspid valve: There was trivial regurgitation. - Pulmonary arteries: PA peak pressure: 32 mm Hg (S). - Pericardium, extracardiac: A trivial, free-flowing pericardial effusion was identified along the right ventricular free wall. The fluid had no internal echoes.       Assessment and Plan   1. CAD - recent chest pain symptoms resolved with imdur.  - Likely related to her RCA disease, prior attempt at intervention aborted due to dissection - continue current therapy, if refractory angina consider repeat attempt at PCI   2. Chronic diastolic HF - no recent symptoms, continue current meds   3. HTN - at goal, continue current meds. Coreg corrected to bid dosing.   4. Hyperlipidemia - continues statin, LDL has been at goal     Arnoldo Lenis, M.D.

## 2017-06-18 NOTE — Patient Instructions (Addendum)
Your physician wants you to follow-up in:6 months with Dr Bryna Colander will receive a reminder letter in the mail two months in advance. If you don't receive a letter, please call our office to schedule the follow-up appointment.    Take Coreg 3.125 mg twice a day, 8 am and 8 pm      Thank you for choosing [facility] !

## 2017-06-22 ENCOUNTER — Encounter (HOSPITAL_COMMUNITY): Payer: Medicare Other

## 2017-06-23 ENCOUNTER — Encounter: Payer: Self-pay | Admitting: Cardiology

## 2017-06-29 ENCOUNTER — Ambulatory Visit (HOSPITAL_COMMUNITY)
Admission: RE | Admit: 2017-06-29 | Discharge: 2017-06-29 | Disposition: A | Discharge status: Home / Self Care | Payer: Medicare Other | Source: Ambulatory Visit | Attending: Pulmonary Disease | Admitting: Pulmonary Disease

## 2017-06-29 DIAGNOSIS — J441 Chronic obstructive pulmonary disease with (acute) exacerbation: Secondary | ICD-10-CM | POA: Insufficient documentation

## 2017-06-29 MED ORDER — ALBUTEROL SULFATE (2.5 MG/3ML) 0.083% IN NEBU
2.5000 mg | INHALATION_SOLUTION | Freq: Once | RESPIRATORY_TRACT | Status: AC
  Administered 2017-06-29: 2.5 mg via RESPIRATORY_TRACT

## 2017-06-30 LAB — PULMONARY FUNCTION TEST
DL/VA % PRED: 70 %
DL/VA: 3.32 ml/min/mmHg/L
DLCO UNC: 15.53 ml/min/mmHg
DLCO unc % pred: 67 %
FEF 25-75 POST: 0.67 L/s
FEF 25-75 Pre: 0.57 L/sec
FEF2575-%Change-Post: 17 %
FEF2575-%PRED-PRE: 29 %
FEF2575-%Pred-Post: 34 %
FEV1-%CHANGE-POST: 0 %
FEV1-%Pred-Post: 69 %
FEV1-%Pred-Pre: 69 %
FEV1-POST: 1.57 L
FEV1-PRE: 1.57 L
FEV1FVC-%CHANGE-POST: 4 %
FEV1FVC-%PRED-PRE: 81 %
FEV6-%Change-Post: 1 %
FEV6-%Pred-Post: 83 %
FEV6-%Pred-Pre: 82 %
FEV6-Post: 2.36 L
FEV6-Pre: 2.34 L
FEV6FVC-%Change-Post: 5 %
FEV6FVC-%PRED-POST: 101 %
FEV6FVC-%Pred-Pre: 96 %
FVC-%Change-Post: -4 %
FVC-%PRED-PRE: 85 %
FVC-%Pred-Post: 82 %
FVC-POST: 2.43 L
FVC-PRE: 2.54 L
PRE FEV1/FVC RATIO: 62 %
PRE FEV6/FVC RATIO: 92 %
Post FEV1/FVC ratio: 65 %
Post FEV6/FVC ratio: 97 %

## 2017-09-28 ENCOUNTER — Other Ambulatory Visit: Payer: Self-pay | Admitting: Cardiology

## 2017-10-14 ENCOUNTER — Other Ambulatory Visit: Payer: Self-pay | Admitting: Cardiology

## 2017-10-15 ENCOUNTER — Other Ambulatory Visit: Payer: Self-pay | Admitting: Cardiology

## 2017-11-03 ENCOUNTER — Other Ambulatory Visit (HOSPITAL_COMMUNITY): Payer: Self-pay | Admitting: Nurse Practitioner

## 2017-11-03 DIAGNOSIS — R945 Abnormal results of liver function studies: Secondary | ICD-10-CM

## 2017-11-03 DIAGNOSIS — R7989 Other specified abnormal findings of blood chemistry: Secondary | ICD-10-CM

## 2017-11-10 ENCOUNTER — Ambulatory Visit (HOSPITAL_COMMUNITY)
Admission: RE | Admit: 2017-11-10 | Discharge: 2017-11-10 | Disposition: A | Discharge status: Home / Self Care | Payer: Medicare Other | Source: Ambulatory Visit | Attending: Nurse Practitioner | Admitting: Nurse Practitioner

## 2017-11-10 DIAGNOSIS — Z9049 Acquired absence of other specified parts of digestive tract: Secondary | ICD-10-CM | POA: Insufficient documentation

## 2017-11-10 DIAGNOSIS — R945 Abnormal results of liver function studies: Secondary | ICD-10-CM | POA: Diagnosis present

## 2017-11-10 DIAGNOSIS — R7989 Other specified abnormal findings of blood chemistry: Secondary | ICD-10-CM

## 2017-11-12 ENCOUNTER — Other Ambulatory Visit: Payer: Self-pay | Admitting: Cardiology

## 2017-12-20 ENCOUNTER — Telehealth (INDEPENDENT_AMBULATORY_CARE_PROVIDER_SITE_OTHER): Payer: Self-pay | Admitting: Internal Medicine

## 2017-12-20 ENCOUNTER — Ambulatory Visit (INDEPENDENT_AMBULATORY_CARE_PROVIDER_SITE_OTHER): Payer: Medicare Other | Admitting: Internal Medicine

## 2017-12-20 ENCOUNTER — Encounter (INDEPENDENT_AMBULATORY_CARE_PROVIDER_SITE_OTHER): Payer: Self-pay | Admitting: Internal Medicine

## 2017-12-20 DIAGNOSIS — K7460 Unspecified cirrhosis of liver: Secondary | ICD-10-CM

## 2017-12-20 HISTORY — DX: Unspecified cirrhosis of liver: K74.60

## 2017-12-20 NOTE — Addendum Note (Signed)
Addended by: Butch Penny on: 12/20/2017 03:28 PM   Modules accepted: Orders

## 2017-12-20 NOTE — Progress Notes (Addendum)
Subjective:    Patient ID: [name], female    DOB: Jun 10, 1950, 67 y.o.   MRN: [id number]  HPI Referred by Dr. Karie Kirks for cirrhosis. States this is a new diagnosis. Does not drink. No family hx of cirrhosis. Underwent an [name] RUQ in August for elevated LFTs.  IMPRESSION: Biliary ductal dilatation with intrahepatic dilatation likely related to post cholecystectomy state. Correlation with bilirubin levels is recommended.  Nodularity and heterogeneity of the liver likely related to underlying cirrhosis. No definitive mass is seen.  Per record Acute hepatitis panel negative. (Will get those labs). Her appetite is good. No weight loss. Has a BM daily. No melena or BRRB. No GI complaints.  No hx of IV drug use. Quit smoking 2 yrs ago.  Divorced. 2 children. One is a diabetic and daughter had uterine cancer but is in remission. No excessive use of Tylenol.   Past hx of CVA, MI with cardiac stents, Stents in brain. Maintained on Brilinta.   Review of Systems Past Medical History:  Diagnosis Date  . Anxiety   . Arthritis    "hands, arms, back, neck, knees, fingers" (01/21/2016)  . Atrial fibrillation (HCC)    ASA daiy; came off Coumadin shortly after stroke  . Breast cancer, left breast ([place]) 1970s; 2015  . Cancer of skin of leg    BLE  . 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HCC)    left  . Chronic lower back pain   . Chronic pain   . COPD (chronic obstructive pulmonary disease) ([place])   . Coronary artery disease   . CVA (cerebral vascular accident) [facility]) pt denies right brain CVA   Right brain secondary to right internal carotid artery dissection for which 2 stents were placed;F/u cerebral angiography in 05/2008-minimal plaque; healing of previously identified left internal carotid artery dissection  . Depression   . GERD (gastroesophageal reflux disease)   . Heart murmur   . Hepatic cirrhosis ([place]) 12/20/2017  . History of blood transfusion    "related to spleen"  . History  of hiatal hernia   . History of kidney stones   . Hyperlipidemia   . Hypertension   . Hypothyroidism   . Kidney stone   . Melanoma of forearm, left ([place])   . Migraine    "a few migraines/year" (01/21/2016)  . NSTEMI (non-ST elevated myocardial infarction) ([place]) 01/21/2016   Archie Endo 01/21/2016  . Obesity   . Stroke [facility]) 04/2003   2 carotid artery stents in place; small left parietal and left hemispheric CVA.Phillis Knack 01/21/2016  . Tobacco use disorder    50 pack years; questionably discontinued in 2010  . Wears glasses     Past Surgical History:  Procedure Laterality Date  . ABDOMINAL HYSTERECTOMY  1978  . APPENDECTOMY    . BREAST BIOPSY Left 1970s; 2015  . BREAST LUMPECTOMY Left 1970s  . BREAST LUMPECTOMY WITH NEEDLE LOCALIZATION Left 07/07/2013   Procedure: BREAST LUMPECTOMY WITH NEEDLE LOCALIZATION;  Surgeon: Merrie Roof, MD;  Location: [place];  Service: General;  Laterality: Left;  . CARDIAC CATHETERIZATION N/A 01/22/2016   Procedure: Left Heart Cath and Coronary Angiography;  Surgeon: Peter M Martinique, MD;  Location: [place] [facility];  Service: Cardiovascular;  Laterality: N/A;  . CARDIAC CATHETERIZATION N/A 01/22/2016   Procedure: Coronary Stent Intervention;  Surgeon: Peter M Martinique, MD;  Location: [place] [facility];  Service: Cardiovascular;  Laterality: N/A;  . CAROTID STENT Left 04/2003   small left parietal and  left hemispheric CVA/notes 07/29/2010  . CHOLECYSTECTOMY OPEN    . COLONOSCOPY  2011  . ESOPHAGOGASTRODUODENOSCOPY  08/06/2011   Procedure: ESOPHAGOGASTRODUODENOSCOPY (EGD);  Surgeon: Rogene Houston, MD;  [facility];  Service: Endoscopy;  Laterality: N/A;  . FLEXIBLE SIGMOIDOSCOPY  08/06/2011   Procedure: FLEXIBLE SIGMOIDOSCOPY;  Surgeon: Rogene Houston, MD;  [facility];  Service: Endoscopy;  Laterality: N/A;  . INGUINAL HERNIA REPAIR Left 1970s  . LARYNX SURGERY  1970s   Polyps excised  . MELANOMA EXCISION Left ~ 2016     forearm  . 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   spontaneous rupture    Allergies  Allergen Reactions  . Tape Other (See Comments)    Causes blisters to form  . Latex Rash    Blisters    Current Outpatient Medications on File Prior to Visit  Medication Sig Dispense Refill  . albuterol (PROVENTIL) (2.5 MG/3ML) 0.083% nebulizer solution Take 2.5 mg by nebulization every 6 (six) hours as needed for wheezing or shortness of breath.    Marland Kitchen amLODipine (NORVASC) 10 MG tablet TAKE ONE TABLET BY MOUTH ONCE DAILY. 90 tablet 1  . aspirin EC 81 MG tablet Take 1 tablet (81 mg total) by mouth daily.    Marland Kitchen atorvastatin (LIPITOR) 80 MG tablet Take 1 tablet (80 mg total) by mouth daily at 6 PM. 30 tablet 12  . cetirizine (ZYRTEC) 10 MG tablet Take 10 mg by mouth daily.    . clorazepate (TRANXENE) 7.5 MG tablet Take 1 tablet by mouth 3 (three) times daily.    . furosemide (LASIX) 40 MG tablet Take 40 mg by mouth daily as needed for fluid.     Marland Kitchen HYDROcodone-acetaminophen (NORCO) 10-325 MG tablet Take 1 tablet by mouth 4 (four) times daily as needed for moderate pain (Lower back).    . isosorbide mononitrate (ISMO,MONOKET) 20 MG tablet Take 20 mg by mouth 2 (two) times daily at 10 AM and 5 PM.    . levothyroxine (SYNTHROID, LEVOTHROID) 50 MCG tablet Take 50 mcg by mouth daily before breakfast.     . lisinopril (PRINIVIL,ZESTRIL) 20 MG tablet TAKE 1 TABLET BY MOUTH DAILY. 90 tablet 0  . nitroGLYCERIN (NITROSTAT) 0.4 MG SL tablet Place 0.4 mg under the tongue every 5 (five) minutes as needed.      . pantoprazole (PROTONIX) 40 MG tablet Take 40 mg by mouth daily.    . potassium chloride SA (K-DUR,KLOR-CON) 20 MEQ tablet Take 20 mEq by mouth daily as needed (when Lasix 40mg  is taken).     Marland Kitchen senna (SENOKOT) 8.6 MG tablet Take 1 tablet by mouth daily.    Marland Kitchen SPIRIVA HANDIHALER 18 MCG inhalation capsule INHALE 1 CAPSULE BY MOUTH DAILY. 30 capsule 3  . ticagrelor (BRILINTA) 90 MG TABS tablet Take 1 tablet (90 mg total) 2  (two) times daily by mouth. 60 tablet 11  . carvedilol (COREG) 3.125 MG tablet Take 1 tablet (3.125 mg total) by mouth 2 (two) times daily with a meal. 180 tablet 3  . lisinopril (PRINIVIL,ZESTRIL) 20 MG tablet Take 1 tablet (20 mg total) by mouth daily. 90 tablet 1   No current facility-administered medications on file prior to visit.                 Objective:   Physical Exam  Blood pressure 126/78, pulse 64, temperature 98.4 F (36.9 C), height 5\' 3"  (1.6 m), weight 200 lb 3.2 oz (90.8 kg). Alert and oriented. Skin warm  and dry. Oral mucosa is moist.   . Sclera anicteric, conjunctivae is pink. Thyroid not enlarged. No cervical lymphadenopathy. Lungs clear. Heart regular rate and rhythm.  Abdomen is soft. Bowel sounds are positive. No hepatomegaly. No abdominal masses felt. No tenderness.  No edema to lower extremities.            Assessment & Plan:  NAFLD. Will rule out auto immune process. CBC, CMET, alpha 1, ceruloplasmin, PT/INR, ANA, SMA, sedrate, ferritin, AFP, Iron, TIBC. Acute Hepatitis Panel.  Further recommendations to follow.

## 2017-12-20 NOTE — Telephone Encounter (Signed)
erro

## 2017-12-22 ENCOUNTER — Telehealth (INDEPENDENT_AMBULATORY_CARE_PROVIDER_SITE_OTHER): Payer: Self-pay | Admitting: Internal Medicine

## 2017-12-22 LAB — CBC WITH DIFFERENTIAL/PLATELET
BASOS PCT: 1.1 %
Basophils Absolute: 109 cells/uL (ref 0–200)
EOS ABS: 228 {cells}/uL (ref 15–500)
Eosinophils Relative: 2.3 %
HCT: 41.7 % (ref 35.0–45.0)
HEMOGLOBIN: 14.2 g/dL (ref 11.7–15.5)
Lymphs Abs: 3237 cells/uL (ref 850–3900)
MCH: 29.2 pg (ref 27.0–33.0)
MCHC: 34.1 g/dL (ref 32.0–36.0)
MCV: 85.6 fL (ref 80.0–100.0)
MONOS PCT: 11.6 %
MPV: 10 fL (ref 7.5–12.5)
NEUTROS ABS: 5178 {cells}/uL (ref 1500–7800)
Neutrophils Relative %: 52.3 %
Platelets: 395 10*3/uL (ref 140–400)
RBC: 4.87 10*6/uL (ref 3.80–5.10)
RDW: 12.8 % (ref 11.0–15.0)
Total Lymphocyte: 32.7 %
WBC: 9.9 10*3/uL (ref 3.8–10.8)
WBCMIX: 1148 {cells}/uL — AB (ref 200–950)

## 2017-12-22 LAB — COMPREHENSIVE METABOLIC PANEL
AG RATIO: 1 (calc) (ref 1.0–2.5)
ALKALINE PHOSPHATASE (APISO): 103 U/L (ref 33–130)
ALT: 95 U/L — ABNORMAL HIGH (ref 6–29)
AST: 105 U/L — AB (ref 10–35)
Albumin: 3.8 g/dL (ref 3.6–5.1)
BILIRUBIN TOTAL: 0.7 mg/dL (ref 0.2–1.2)
BUN: 14 mg/dL (ref 7–25)
CALCIUM: 9.4 mg/dL (ref 8.6–10.4)
CHLORIDE: 103 mmol/L (ref 98–110)
CO2: 25 mmol/L (ref 20–32)
Creat: 0.78 mg/dL (ref 0.50–0.99)
GLOBULIN: 3.7 g/dL (ref 1.9–3.7)
GLUCOSE: 99 mg/dL (ref 65–99)
Potassium: 3.8 mmol/L (ref 3.5–5.3)
Sodium: 136 mmol/L (ref 135–146)
Total Protein: 7.5 g/dL (ref 6.1–8.1)

## 2017-12-22 LAB — ANTI-NUCLEAR AB-TITER (ANA TITER)

## 2017-12-22 LAB — PROTIME-INR
INR: 1.1
PROTHROMBIN TIME: 11.2 s (ref 9.0–11.5)

## 2017-12-22 LAB — HEPATITIS PANEL, ACUTE
HEP B C IGM: NONREACTIVE
HEP B S AG: NONREACTIVE
Hep A IgM: NONREACTIVE
Hepatitis C Ab: NONREACTIVE
SIGNAL TO CUT-OFF: 0.08 (ref <1.00)

## 2017-12-22 LAB — FERRITIN: Ferritin: 325 ng/mL — ABNORMAL HIGH (ref 16–288)

## 2017-12-22 LAB — ANA: Anti Nuclear Antibody(ANA): POSITIVE — AB

## 2017-12-22 LAB — CERULOPLASMIN: Ceruloplasmin: 38 mg/dL (ref 18–53)

## 2017-12-22 LAB — AFP TUMOR MARKER: AFP-Tumor Marker: 4.3 ng/mL

## 2017-12-22 LAB — ALPHA-1-ANTITRYPSIN: A-1 Antitrypsin, Ser: 202 mg/dL — ABNORMAL HIGH (ref 83–199)

## 2017-12-22 LAB — MITOCHONDRIAL ANTIBODIES: Mitochondrial M2 Ab, IgG: 23 U — ABNORMAL HIGH

## 2017-12-22 NOTE — Telephone Encounter (Signed)
Patient called wanted to know lab results - ph# [phone]

## 2017-12-22 NOTE — Telephone Encounter (Signed)
I have spoken with patient 

## 2018-01-19 ENCOUNTER — Telehealth (INDEPENDENT_AMBULATORY_CARE_PROVIDER_SITE_OTHER): Payer: Self-pay | Admitting: Internal Medicine

## 2018-01-19 ENCOUNTER — Other Ambulatory Visit (INDEPENDENT_AMBULATORY_CARE_PROVIDER_SITE_OTHER): Payer: Self-pay | Admitting: Internal Medicine

## 2018-01-19 DIAGNOSIS — R768 Other specified abnormal immunological findings in serum: Secondary | ICD-10-CM

## 2018-01-19 DIAGNOSIS — K7460 Unspecified cirrhosis of liver: Secondary | ICD-10-CM

## 2018-01-19 NOTE — Telephone Encounter (Signed)
US biopsy liver. Order is in. I have talked with patient

## 2018-01-19 NOTE — Progress Notes (Signed)
U

## 2018-01-19 NOTE — Telephone Encounter (Signed)
They will contact patient to schedule

## 2018-01-28 ENCOUNTER — Ambulatory Visit (HOSPITAL_COMMUNITY)
Admission: RE | Admit: 2018-01-28 | Discharge: 2018-01-28 | Disposition: A | Discharge status: Home / Self Care | Payer: Medicare Other | Source: Ambulatory Visit | Attending: Internal Medicine | Admitting: Internal Medicine

## 2018-02-01 ENCOUNTER — Other Ambulatory Visit: Payer: Self-pay | Admitting: Radiology

## 2018-02-02 ENCOUNTER — Other Ambulatory Visit: Payer: Self-pay

## 2018-02-02 ENCOUNTER — Ambulatory Visit (HOSPITAL_COMMUNITY)
Admission: RE | Admit: 2018-02-02 | Discharge: 2018-02-02 | Disposition: A | Discharge status: Home / Self Care | Payer: Medicare Other | Source: Ambulatory Visit | Attending: Internal Medicine | Admitting: Internal Medicine

## 2018-02-02 ENCOUNTER — Encounter (HOSPITAL_COMMUNITY): Payer: Self-pay

## 2018-02-02 DIAGNOSIS — E669 Obesity, unspecified: Secondary | ICD-10-CM | POA: Insufficient documentation

## 2018-02-02 DIAGNOSIS — Z853 Personal history of malignant neoplasm of breast: Secondary | ICD-10-CM | POA: Diagnosis not present

## 2018-02-02 DIAGNOSIS — Z87442 Personal history of urinary calculi: Secondary | ICD-10-CM | POA: Diagnosis not present

## 2018-02-02 DIAGNOSIS — Z8673 Personal history of transient ischemic attack (TIA), and cerebral infarction without residual deficits: Secondary | ICD-10-CM | POA: Insufficient documentation

## 2018-02-02 DIAGNOSIS — Z6835 Body mass index (BMI) 35.0-35.9, adult: Secondary | ICD-10-CM | POA: Insufficient documentation

## 2018-02-02 DIAGNOSIS — K754 Autoimmune hepatitis: Secondary | ICD-10-CM | POA: Diagnosis not present

## 2018-02-02 DIAGNOSIS — I2510 Atherosclerotic heart disease of native coronary artery without angina pectoris: Secondary | ICD-10-CM | POA: Insufficient documentation

## 2018-02-02 DIAGNOSIS — Z90710 Acquired absence of both cervix and uterus: Secondary | ICD-10-CM | POA: Insufficient documentation

## 2018-02-02 DIAGNOSIS — R768 Other specified abnormal immunological findings in serum: Secondary | ICD-10-CM | POA: Insufficient documentation

## 2018-02-02 DIAGNOSIS — Z85828 Personal history of other malignant neoplasm of skin: Secondary | ICD-10-CM | POA: Diagnosis not present

## 2018-02-02 DIAGNOSIS — Z8249 Family history of ischemic heart disease and other diseases of the circulatory system: Secondary | ICD-10-CM | POA: Insufficient documentation

## 2018-02-02 DIAGNOSIS — Z85820 Personal history of malignant melanoma of skin: Secondary | ICD-10-CM | POA: Insufficient documentation

## 2018-02-02 DIAGNOSIS — I10 Essential (primary) hypertension: Secondary | ICD-10-CM | POA: Diagnosis not present

## 2018-02-02 DIAGNOSIS — K7460 Unspecified cirrhosis of liver: Secondary | ICD-10-CM | POA: Insufficient documentation

## 2018-02-02 DIAGNOSIS — E785 Hyperlipidemia, unspecified: Secondary | ICD-10-CM | POA: Diagnosis not present

## 2018-02-02 DIAGNOSIS — F329 Major depressive disorder, single episode, unspecified: Secondary | ICD-10-CM | POA: Diagnosis not present

## 2018-02-02 DIAGNOSIS — J449 Chronic obstructive pulmonary disease, unspecified: Secondary | ICD-10-CM | POA: Insufficient documentation

## 2018-02-02 DIAGNOSIS — K219 Gastro-esophageal reflux disease without esophagitis: Secondary | ICD-10-CM | POA: Diagnosis not present

## 2018-02-02 DIAGNOSIS — E039 Hypothyroidism, unspecified: Secondary | ICD-10-CM | POA: Diagnosis not present

## 2018-02-02 DIAGNOSIS — Z79899 Other long term (current) drug therapy: Secondary | ICD-10-CM | POA: Diagnosis not present

## 2018-02-02 DIAGNOSIS — F419 Anxiety disorder, unspecified: Secondary | ICD-10-CM | POA: Diagnosis not present

## 2018-02-02 DIAGNOSIS — Z79890 Hormone replacement therapy (postmenopausal): Secondary | ICD-10-CM | POA: Insufficient documentation

## 2018-02-02 DIAGNOSIS — Z87891 Personal history of nicotine dependence: Secondary | ICD-10-CM | POA: Insufficient documentation

## 2018-02-02 DIAGNOSIS — M1990 Unspecified osteoarthritis, unspecified site: Secondary | ICD-10-CM | POA: Insufficient documentation

## 2018-02-02 DIAGNOSIS — Z9049 Acquired absence of other specified parts of digestive tract: Secondary | ICD-10-CM | POA: Diagnosis not present

## 2018-02-02 DIAGNOSIS — Z9081 Acquired absence of spleen: Secondary | ICD-10-CM | POA: Diagnosis not present

## 2018-02-02 DIAGNOSIS — Z7982 Long term (current) use of aspirin: Secondary | ICD-10-CM | POA: Insufficient documentation

## 2018-02-02 DIAGNOSIS — I4891 Unspecified atrial fibrillation: Secondary | ICD-10-CM | POA: Diagnosis not present

## 2018-02-02 LAB — APTT: aPTT: 25 seconds (ref 24–36)

## 2018-02-02 LAB — PROTIME-INR
INR: 1.19
Prothrombin Time: 15 seconds (ref 11.4–15.2)

## 2018-02-02 LAB — CBC
HEMATOCRIT: 42.5 % (ref 36.0–46.0)
Hemoglobin: 13.6 g/dL (ref 12.0–15.0)
MCH: 28.1 pg (ref 26.0–34.0)
MCHC: 32 g/dL (ref 30.0–36.0)
MCV: 87.8 fL (ref 80.0–100.0)
Platelets: 296 10*3/uL (ref 150–400)
RBC: 4.84 MIL/uL (ref 3.87–5.11)
RDW: 13.1 % (ref 11.5–15.5)
WBC: 10.1 10*3/uL (ref 4.0–10.5)
nRBC: 0 % (ref 0.0–0.2)

## 2018-02-02 MED ORDER — SODIUM CHLORIDE 0.9 % IV SOLN
INTRAVENOUS | Status: DC
  Administered 2018-02-02: 13:00:00 via INTRAVENOUS

## 2018-02-02 MED ORDER — MIDAZOLAM HCL 2 MG/2ML IJ SOLN
INTRAMUSCULAR | Status: AC
  Filled 2018-02-02: qty 2

## 2018-02-02 MED ORDER — LIDOCAINE HCL (PF) 1 % IJ SOLN
INTRAMUSCULAR | Status: AC
  Filled 2018-02-02: qty 30

## 2018-02-02 MED ORDER — FENTANYL CITRATE (PF) 100 MCG/2ML IJ SOLN
INTRAMUSCULAR | Status: AC | PRN
  Administered 2018-02-02: 50 ug via INTRAVENOUS
  Administered 2018-02-02: 25 ug via INTRAVENOUS

## 2018-02-02 MED ORDER — MIDAZOLAM HCL 2 MG/2ML IJ SOLN
INTRAMUSCULAR | Status: AC | PRN
  Administered 2018-02-02 (×2): 1 mg via INTRAVENOUS

## 2018-02-02 MED ORDER — GELATIN ABSORBABLE 12-7 MM EX MISC
CUTANEOUS | Status: AC
  Filled 2018-02-02: qty 1

## 2018-02-02 MED ORDER — FENTANYL CITRATE (PF) 100 MCG/2ML IJ SOLN
INTRAMUSCULAR | Status: AC
  Filled 2018-02-02: qty 2

## 2018-02-02 MED ORDER — OXYCODONE HCL 5 MG PO TABS
5.0000 mg | ORAL_TABLET | ORAL | Status: DC | PRN
  Administered 2018-02-02: 5 mg via ORAL
  Filled 2018-02-02: qty 1

## 2018-02-02 NOTE — Discharge Instructions (Signed)
Moderate Conscious Sedation, Adult, Care After °These instructions provide you with information about caring for yourself after your procedure. Your health care provider may also give you more specific instructions. Your treatment has been planned according to current medical practices, but problems sometimes occur. Call your health care provider if you have any problems or questions after your procedure. °What can I expect after the procedure? °After your procedure, it is common: °· To feel sleepy for several hours. °· To feel clumsy and have poor balance for several hours. °· To have poor judgment for several hours. °· To vomit if you eat too soon. ° °Follow these instructions at home: °For at least 24 hours after the procedure: ° °· Do not: °? Participate in activities where you could fall or become injured. °? Drive. °? Use heavy machinery. °? Drink alcohol. °? Take sleeping pills or medicines that cause drowsiness. °? Make important decisions or sign legal documents. °? Take care of children on your own. °· Rest. °Eating and drinking °· Follow the diet recommended by your health care provider. °· If you vomit: °? Drink water, juice, or soup when you can drink without vomiting. °? Make sure you have little or no nausea before eating solid foods. °General instructions °· Have a responsible adult stay with you until you are awake and alert. °· Take over-the-counter and prescription medicines only as told by your health care provider. °· If you smoke, do not smoke without supervision. °· Keep all follow-up visits as told by your health care provider. This is important. °Contact a health care provider if: °· You keep feeling nauseous or you keep vomiting. °· You feel light-headed. °· You develop a rash. °· You have a fever. °Get help right away if: °· You have trouble breathing. °This information is not intended to replace advice given to you by your health care provider. Make sure you discuss any questions you have  with your health care provider. °Document Released: 12/21/2012 Document Revised: 08/05/2015 Document Reviewed: 06/22/2015 °Elsevier Interactive Patient Education © 2018 [employer] ° ° °Liver Biopsy, Care After °These instructions give you information on caring for yourself after your procedure. Your doctor may also give you more specific instructions. Call your doctor if you have any problems or questions after your procedure. °Follow these instructions at home: °· Rest at home for 1-2 days or as told by your doctor. °· Have someone stay with you for at least 24 hours. °· Do not do these things in the first 24 hours: °? Drive. °? Use machinery. °? Take care of other people. °? Sign legal documents. °? Take a bath or shower. °· There are many different ways to close and cover a cut (incision). For example, a cut can be closed with stitches, skin glue, or adhesive strips. Follow your doctor's instructions on: °? Taking care of your cut. °? Changing and removing your bandage (dressing). °? Removing whatever was used to close your cut. °· Do not drink alcohol in the first week. °· Do not lift more than 5 pounds or play contact sports for the first 2 weeks. °· Take medicines only as told by your doctor. For 1 week, do not take medicine that has aspirin in it or medicines like ibuprofen. °· Get your test results. °Contact a doctor if: °· A cut bleeds and leaves more than just a small spot of blood. °· A cut is red, puffs up (swells), or hurts more than before. °· Fluid or something else   comes from a cut. °· A cut smells bad. °· You have a fever or chills. °Get help right away if: °· You have swelling, bloating, or pain in your belly (abdomen). °· You get dizzy or faint. °· You have a rash. °· You feel sick to your stomach (nauseous) or throw up (vomit). °· You have trouble breathing, feel short of breath, or feel faint. °· Your chest hurts. °· You have problems talking or seeing. °· You have trouble balancing or moving  your arms or legs. °This information is not intended to replace advice given to you by your health care provider. Make sure you discuss any questions you have with your health care provider. °Document Released: 12/10/2007 Document Revised: 08/08/2015 Document Reviewed: 04/28/2013 °Elsevier Interactive Patient Education © 2018 [employer] Inc[employer] ° ° °

## 2018-02-02 NOTE — Procedures (Signed)
US guided random liver biopsy.  3 cores obtained.  Minimal blood loss and no immediate complication.  See full report in Imaging.

## 2018-02-02 NOTE — H&P (Addendum)
Chief Complaint: Patient was seen in consultation today for liver core biopsy at the request of [place]   Referring Physician(s): Setzer,Terri L  Supervising Physician: Markus Daft  Patient Status: [facility] - Out-pt  History of Present Illness: [name] is a 67 y.o. female   PMD Dr Karie Kirks Dx Cirrhosis Elevated LFTs  [name] 11/10/17: IMPRESSION: Biliary ductal dilatation with intrahepatic dilatation likely related to post cholecystectomy state. Correlation with bilirubin levels is recommended. Nodularity and heterogeneity of the liver likely related to underlying cirrhosis. No definitive mass is seen.  Referred to [facility] for liver core bx LD Brilinta 2 weeks ago  Past Medical History:  Diagnosis Date  . Anxiety   . Arthritis    "hands, arms, back, neck, knees, fingers" (01/21/2016)  . Atrial fibrillation (HCC)    ASA daiy; came off Coumadin shortly after stroke  . Breast cancer, left breast ([place]) 1970s; 2015  . Cancer of skin of leg    BLE  . 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HCC)    left  . Chronic lower back pain   . Chronic pain   . COPD (chronic obstructive pulmonary disease) ([place])   . Coronary artery disease   . CVA (cerebral vascular accident) [facility]) pt denies right brain CVA   Right brain secondary to right internal carotid artery dissection for which 2 stents were placed;F/u cerebral angiography in 05/2008-minimal plaque; healing of previously identified left internal carotid artery dissection  . Depression   . GERD (gastroesophageal reflux disease)   . Heart murmur   . Hepatic cirrhosis ([place]) 12/20/2017  . History of blood transfusion    "related to spleen"  . History of hiatal hernia   . History of kidney stones   . Hyperlipidemia   . Hypertension   . Hypothyroidism   . Kidney stone   . Melanoma of forearm, left ([place])   . Migraine    "a few migraines/year" (01/21/2016)  . NSTEMI (non-ST elevated myocardial infarction) ([place]) 01/21/2016   Archie Endo  01/21/2016  . Obesity   . Stroke [facility]) 04/2003   2 carotid artery stents in place; small left parietal and left hemispheric CVA.Phillis Knack 01/21/2016  . Tobacco use disorder    50 pack years; questionably discontinued in 2010  . Wears glasses     Past Surgical History:  Procedure Laterality Date  . ABDOMINAL HYSTERECTOMY  1978  . APPENDECTOMY    . BREAST BIOPSY Left 1970s; 2015  . BREAST LUMPECTOMY Left 1970s  . BREAST LUMPECTOMY WITH NEEDLE LOCALIZATION Left 07/07/2013   Procedure: BREAST LUMPECTOMY WITH NEEDLE LOCALIZATION;  Surgeon: Merrie Roof, MD;  Location: [place];  Service: General;  Laterality: Left;  . CARDIAC CATHETERIZATION N/A 01/22/2016   Procedure: Left Heart Cath and Coronary Angiography;  Surgeon: Peter M Martinique, MD;  Location: [place] CV LAB;  Service: Cardiovascular;  Laterality: N/A;  . CARDIAC CATHETERIZATION N/A 01/22/2016   Procedure: Coronary Stent Intervention;  Surgeon: Peter M Martinique, MD;  Location: [place] CV [facility];  Service: Cardiovascular;  Laterality: N/A;  . CAROTID STENT Left 04/2003   small left parietal and left hemispheric CVA/notes 07/29/2010  . CHOLECYSTECTOMY OPEN    . COLONOSCOPY  2011  . ESOPHAGOGASTRODUODENOSCOPY  08/06/2011   Procedure: ESOPHAGOGASTRODUODENOSCOPY (EGD);  Surgeon: Rogene Houston, MD;  [facility];  Service: Endoscopy;  Laterality: N/A;  . FLEXIBLE SIGMOIDOSCOPY  08/06/2011   Procedure: FLEXIBLE SIGMOIDOSCOPY;  Surgeon: Rogene Houston, MD;  [facility];  Service: Endoscopy;  Laterality: N/A;  . INGUINAL HERNIA REPAIR Left 1970s  . LARYNX SURGERY  1970s   Polyps excised  . MELANOMA EXCISION Left ~ 2016   forearm  . 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   spontaneous rupture    Allergies: Tape and Latex  Medications: Prior to Admission medications   Medication Sig Start Date End Date Taking? Authorizing Provider  albuterol (PROVENTIL) (2.5 MG/3ML) 0.083% nebulizer solution Take 2.5 mg by  nebulization every 6 (six) hours as needed for wheezing or shortness of breath.   Yes [provider]  amLODipine (NORVASC) 10 MG tablet TAKE ONE TABLET BY MOUTH ONCE DAILY. Patient taking differently: Take 10 mg by mouth daily.  10/15/17  Yes BranchAlphonse Guild, MD  aspirin EC 81 MG tablet Take 1 tablet (81 mg total) by mouth daily. 01/24/16  Yes Arbutus Leas, NP  atorvastatin (LIPITOR) 80 MG tablet Take 1 tablet (80 mg total) by mouth daily at 6 PM. 03/26/17  Yes Branch, Alphonse Guild, MD  carvedilol (COREG) 3.125 MG tablet Take 1 tablet (3.125 mg total) by mouth 2 (two) times daily with a meal. 06/18/17  Yes Branch, Alphonse Guild, MD  cetirizine (ZYRTEC) 10 MG tablet Take 10 mg by mouth daily.   Yes [provider]  clorazepate (TRANXENE) 7.5 MG tablet Take 7.5 mg by mouth 3 (three) times daily.  01/03/16  Yes [provider]  furosemide (LASIX) 40 MG tablet Take 40 mg by mouth daily as needed for fluid.    Yes [provider]  HYDROcodone-acetaminophen (NORCO) 10-325 MG tablet Take 1 tablet by mouth 3 (three) times daily as needed for moderate pain.    Yes [provider]  isosorbide mononitrate (ISMO,MONOKET) 20 MG tablet Take 20 mg by mouth 2 (two) times daily at 10 AM and 5 PM.   Yes [provider]  levothyroxine (SYNTHROID, LEVOTHROID) 50 MCG tablet Take 50 mcg by mouth daily before breakfast.  07/20/14  Yes [provider]  lisinopril (PRINIVIL,ZESTRIL) 20 MG tablet Take 1 tablet (20 mg total) by mouth daily. 05/04/17 01/26/18 Yes Branch, Alphonse Guild, MD  nitroGLYCERIN (NITROSTAT) 0.4 MG SL tablet Place 0.4 mg under the tongue every 5 (five) minutes as needed for chest pain.    Yes [provider]  pantoprazole (PROTONIX) 40 MG tablet Take 40 mg by mouth daily. 09/13/15  Yes [provider]  potassium chloride SA (K-DUR,KLOR-CON) 20 MEQ tablet Take 20 mEq by mouth daily as needed (when Lasix 40mg  is taken).  08/19/15  Yes  [provider]  senna (SENOKOT) 8.6 MG tablet Take 1 tablet by mouth at bedtime.    Yes [provider]  SPIRIVA HANDIHALER 18 MCG inhalation capsule INHALE 1 CAPSULE BY MOUTH DAILY. Patient taking differently: Place 18 mcg into inhaler and inhale daily.  11/16/17  Yes BranchAlphonse Guild, MD  ticagrelor (BRILINTA) 90 MG TABS tablet Take 1 tablet (90 mg total) 2 (two) times daily by mouth. 01/26/17  Yes Branch, Alphonse Guild, MD  albuterol (PROVENTIL HFA;VENTOLIN HFA) 108 (90 Base) MCG/ACT inhaler Inhale 2 puffs into the lungs every 6 (six) hours as needed for wheezing or shortness of breath.    [provider]     Family History  Problem Relation Age of Onset  . Heart failure Mother   . Cancer Father   . Heart failure Father   . Cancer Sister   . Dementia Sister   . Heart disease Unknown   .  Arthritis Unknown   . Cancer Unknown   . Diabetes Unknown   . Kidney disease Unknown   . Cancer Sister   . Heart failure Brother     Social History   Socioeconomic History  . Marital status: Divorced    Spouse name: Not on file  . Number of children: 2  . Years of education: Not on file  . Highest education level: Not on file  Occupational History  . Occupation: Disabled  . Occupation: retired  Scientific laboratory technician  . Financial resource strain: Not on file  . Food insecurity:    Worry: Not on file    Inability: Not on file  . Transportation needs:    Medical: Not on file    Non-medical: Not on file  Tobacco Use  . Smoking status: Former Smoker    Packs/day: 1.00    Years: 40.00    Pack years: 40.00    Types: Cigarettes    Last attempt to quit: 07/06/2007    Years since quitting: 10.5  . Smokeless tobacco: Never Used  Substance and Sexual Activity  . Alcohol use: No    Alcohol/week: 0.0 standard drinks  . Drug use: No  . Sexual activity: Not Currently  Lifestyle  . Physical activity:    Days per week: Not on file    Minutes per session: Not on file  .  Stress: Not on file  Relationships  . Social connections:    Talks on phone: Not on file    Gets together: Not on file    Attends religious service: Not on file    Active member of club or organization: Not on file    Attends meetings of clubs or organizations: Not on file    Relationship status: Not on file  Other Topics Concern  . Not on file  Social History Narrative   ** Merged History Encounter **        Review of Systems: A 12 point ROS discussed and pertinent positives are indicated in the HPI above.  All other systems are negative.  Review of Systems  Constitutional: Negative for activity change, appetite change, fatigue and fever.  Respiratory: Negative for cough and shortness of breath.   Gastrointestinal: Negative for abdominal pain and nausea.  Musculoskeletal: Negative for back pain.  Neurological: Negative for weakness.  Psychiatric/Behavioral: Negative for behavioral problems and confusion.    Vital Signs: BP 139/77   Pulse 72   Temp 97.9 F (36.6 C)   Resp 18   Ht 5\' 3"  (1.6 m)   Wt 200 lb (90.7 kg)   SpO2 93%   BMI 35.43 kg/m   Physical Exam  Constitutional: She is oriented to person, place, and time.  Cardiovascular: Normal rate, regular rhythm and normal heart sounds.  Pulmonary/Chest: Effort normal and breath sounds normal.  Abdominal: Soft. Bowel sounds are normal.  Musculoskeletal: Normal range of motion.  Neurological: She is alert and oriented to person, place, and time.  Skin: Skin is warm and dry.  Psychiatric: She has a normal mood and affect. Her behavior is normal. Judgment and thought content normal.  Vitals reviewed.   Imaging: No results found.  Labs:  CBC: Recent Labs    12/21/17 1114  WBC 9.9  HGB 14.2  HCT 41.7  PLT 395    COAGS: Recent Labs    12/21/17 1114  INR 1.1    BMP: Recent Labs    12/21/17 1114  NA 136  K 3.8  CL  103  CO2 25  GLUCOSE 99  BUN 14  CALCIUM 9.4  CREATININE 0.78    LIVER  FUNCTION TESTS: Recent Labs    12/21/17 1114  BILITOT 0.7  AST 105*  ALT 95*  PROT 7.5    TUMOR MARKERS: Recent Labs    12/21/17 1114  AFPTM 4.3    Assessment and Plan:  Cirrhosis Elevated liver functions Scheduled for liver core biopsy Risks and benefits discussed with the patient including, but not limited to bleeding, infection, damage to adjacent structures or low yield requiring additional tests.  All of the patient's questions were answered, patient is agreeable to proceed. Consent signed and in chart.   Thank you for this interesting consult.  I greatly enjoyed meeting QUETZALLY CALLAS and look forward to participating in their care.  A copy of this report was sent to the requesting provider on this date.  Electronically Signed: Lavonia Drafts, PA-C 02/02/2018, 11:13 AM   I spent a total of  30 Minutes   in face to face in clinical consultation, greater than 50% of which was counseling/coordinating care for liver core biopsy

## 2018-02-08 ENCOUNTER — Telehealth (INDEPENDENT_AMBULATORY_CARE_PROVIDER_SITE_OTHER): Payer: Self-pay | Admitting: Internal Medicine

## 2018-02-08 NOTE — Telephone Encounter (Signed)
She will need a CBC and hepatic in 2 weeks. And at 4 weeks. Please send letter

## 2018-02-08 NOTE — Telephone Encounter (Signed)
Needs OV in 4 weeks.

## 2018-02-08 NOTE — Telephone Encounter (Signed)
Will start on Imuran 100mg  in 4 weeks if her liver enzymes are coming down.

## 2018-02-08 NOTE — Telephone Encounter (Signed)
err

## 2018-02-08 NOTE — Telephone Encounter (Signed)
Rx for a Prednisone taper called to [facility].  30mg  x 1 weeks, 25mg  x 1 week, 20mg  x 1 week, 15mg  x 1 week, 10mg  x 1 week, 5mg  x 1 week

## 2018-02-09 ENCOUNTER — Encounter (INDEPENDENT_AMBULATORY_CARE_PROVIDER_SITE_OTHER): Payer: Self-pay | Admitting: *Deleted

## 2018-02-09 ENCOUNTER — Other Ambulatory Visit (INDEPENDENT_AMBULATORY_CARE_PROVIDER_SITE_OTHER): Payer: Self-pay | Admitting: *Deleted

## 2018-02-09 ENCOUNTER — Telehealth: Payer: Self-pay | Admitting: Cardiology

## 2018-02-09 DIAGNOSIS — K219 Gastro-esophageal reflux disease without esophagitis: Secondary | ICD-10-CM

## 2018-02-09 DIAGNOSIS — K7460 Unspecified cirrhosis of liver: Secondary | ICD-10-CM

## 2018-02-09 NOTE — Telephone Encounter (Signed)
   Would recommend restarting unless told otherwise by Interventional Radiology but it was listed to start back on the AVS provided on 02/02/2018. Typically these are started back the evening of the procedure and given that it has already been 7 days, would not see any contraindication to restarting now.   Signed, Erma Heritage, PA-C 02/09/2018, 11:35 AM Pager: [phone]

## 2018-02-09 NOTE — Telephone Encounter (Signed)
Wants to know when she should start back her Brilanta.  She has been off of medication for about 3 weeks due to procedure she had.

## 2018-02-09 NOTE — Telephone Encounter (Signed)
Patient notified and verbalized understanding. 

## 2018-02-09 NOTE — Telephone Encounter (Signed)
All labs noted and a letter has been sent to the patient as a reminder.

## 2018-02-09 NOTE — Telephone Encounter (Signed)
Patient states that she had liver biopsy on 02/02/2018.  Stated that she has been off of her Brilinta and ASA since 01/20/2018.  Was told to contact cardiology to see when she needed to restart these medications.

## 2018-02-14 ENCOUNTER — Other Ambulatory Visit: Payer: Self-pay

## 2018-02-14 ENCOUNTER — Other Ambulatory Visit: Payer: Self-pay | Admitting: Cardiology

## 2018-02-14 MED ORDER — TICAGRELOR 90 MG PO TABS: 90.0000 mg | ORAL_TABLET | Freq: Two times a day (BID) | ORAL | 11 refills | Status: DC

## 2018-02-14 NOTE — Telephone Encounter (Signed)
Refilled brilinta per fax request 

## 2018-02-23 LAB — HEPATIC FUNCTION PANEL
AG Ratio: 1.2 (calc) (ref 1.0–2.5)
ALT: 29 U/L (ref 6–29)
AST: 22 U/L (ref 10–35)
Albumin: 3.7 g/dL (ref 3.6–5.1)
Alkaline phosphatase (APISO): 77 U/L (ref 33–130)
BILIRUBIN TOTAL: 0.9 mg/dL (ref 0.2–1.2)
Bilirubin, Direct: 0.2 mg/dL (ref 0.0–0.2)
GLOBULIN: 3.2 g/dL (ref 1.9–3.7)
Indirect Bilirubin: 0.7 mg/dL (calc) (ref 0.2–1.2)
Total Protein: 6.9 g/dL (ref 6.1–8.1)

## 2018-02-23 LAB — CBC
HCT: 43.1 % (ref 35.0–45.0)
HEMOGLOBIN: 14.6 g/dL (ref 11.7–15.5)
MCH: 29.7 pg (ref 27.0–33.0)
MCHC: 33.9 g/dL (ref 32.0–36.0)
MCV: 87.6 fL (ref 80.0–100.0)
MPV: 9.9 fL (ref 7.5–12.5)
PLATELETS: 351 10*3/uL (ref 140–400)
RBC: 4.92 10*6/uL (ref 3.80–5.10)
RDW: 12.2 % (ref 11.0–15.0)
WBC: 14.1 10*3/uL — ABNORMAL HIGH (ref 3.8–10.8)

## 2018-02-24 ENCOUNTER — Other Ambulatory Visit (INDEPENDENT_AMBULATORY_CARE_PROVIDER_SITE_OTHER): Payer: Self-pay | Admitting: Internal Medicine

## 2018-02-24 DIAGNOSIS — K754 Autoimmune hepatitis: Secondary | ICD-10-CM

## 2018-02-24 MED ORDER — AZATHIOPRINE 50 MG PO TABS: 75.0000 mg | ORAL_TABLET | Freq: Every day | ORAL | 3 refills | Status: DC

## 2018-03-01 ENCOUNTER — Other Ambulatory Visit: Payer: Self-pay | Admitting: Cardiology

## 2018-03-03 ENCOUNTER — Ambulatory Visit (INDEPENDENT_AMBULATORY_CARE_PROVIDER_SITE_OTHER): Payer: Medicare Other | Admitting: Internal Medicine

## 2018-03-03 ENCOUNTER — Other Ambulatory Visit (INDEPENDENT_AMBULATORY_CARE_PROVIDER_SITE_OTHER): Payer: Self-pay | Admitting: *Deleted

## 2018-03-03 ENCOUNTER — Encounter (INDEPENDENT_AMBULATORY_CARE_PROVIDER_SITE_OTHER): Payer: Self-pay | Admitting: Internal Medicine

## 2018-03-03 VITALS — BP 140/70 | HR 80 | Temp 98.1°F | Ht 63.0 in | Wt 204.5 lb

## 2018-03-03 DIAGNOSIS — K754 Autoimmune hepatitis: Secondary | ICD-10-CM

## 2018-03-03 NOTE — Progress Notes (Signed)
Subjective:    Patient ID: [name], female    DOB: Jan 12, 1951, 67 y.o.   MRN: [id number]  HPI Here today for f/u. She was referred by Dr. Karie Kirks for new diagnosis of cirrhosis. Underwent a liver biopsy 02/02/2018. 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 I discussed with Dr. Laural Golden She will start the Imuran 2m today..  In November she was started on Prednisone taper of 339mweekly then reduce by 5 mg weekly till finished.  Acute hepatitis panel was negative. No hx of IV drug use. Quit smoking 2 years ago. Divorced. 2 children. No excessive use of Tylenol.   12/21/2017 Ferritin 325, Mitochondrial IgG 23. AFP 4.3 Alpha 1 antitrypsin 202, ANA positive, ANA titer 1:1,280, ANA Pattern 1 Nuclear Homogenous    CBC    Component Value Date/Time   WBC 14.1 (H) 02/23/2018 1041   RBC 4.92 02/23/2018 1041   HGB 14.6 02/23/2018 1041   HCT 43.1 02/23/2018 1041   PLT 351 02/23/2018 1041   MCV 87.6 02/23/2018 1041   MCH 29.7 02/23/2018 1041   MCHC 33.9 02/23/2018 1041   RDW 12.2 02/23/2018 1041   LYMPHSABS 3,237 12/21/2017 1114   MONOABS 0.7 08/21/2016 1533   EOSABS 228 12/21/2017 1114   BASOSABS 109 12/21/2017 1114   Hepatic Function Latest Ref Rng & Units 02/23/2018 12/21/2017 11/16/2014  Total Protein 6.1 - 8.1 g/dL 6.9 7.5 7.4  Albumin 3.5 - 5.0 g/dL - - 4.4  AST 10 - 35 U/L 22 105(H) 29  ALT 6 - 29 U/L 29 95(H) 22  Alk Phosphatase 38 - 126 U/L - - 59  Total Bilirubin 0.2 - 1.2 mg/dL 0.9 0.7 0.6  Bilirubin, Direct 0.0 - 0.2 mg/dL 0.2 - -     Review of Systems Past Medical History:  Diagnosis Date  . Anxiety   . Arthritis    "hands, arms, back, neck, knees, fingers" (01/21/2016)  . Atrial fibrillation (HCC)    ASA daiy; came off Coumadin shortly after stroke  . [place] cancer[place] breast [place]; [place]  . Cancer of skin of leg    BLE  . 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HCC)    left  . Chronic lower  back pain   . Chronic pain   . COPD (chronic obstructive pulmonary disease) ([place]  . Coronary artery disease   . [facility]   Right brain secondary to right internal carotid artery dissection for which 2 stents were placed;F/u cerebral angiography in 05/2008-minimal plaque; healing of previously identified left internal carotid artery dissection  . Depression   . GERD (gastroesophageal reflux disease)   . Heart murmur   . Hepatic cirrhosis ([place]/09/2017  . History of blood transfusion    "related to spleen"  . History of hiatal hernia   . History of kidney stones   . Hyperlipidemia   . Hypertension   . Hypothyroidism   . Kidney stone   . Mel[place]oma of [place], left ([place]  . Migraine    "a few migraines/year" (01/21/2016)  . NSTEMI (non-ST elevated myocardial infarction) ([place]11/09/2015   /nArchie Endo1/09/2015  . Obesity   . [facility]   2 carotid artery stents in place; small left parietal and left hemispheric CVA./nPhillis Knack1/09/2015  . Tobacco use disorder    50 pack years; questionably discontinued in 2010  . Wears glasses     Past Surgical  History:  Procedure Laterality Date  . ABDOMINAL HYSTERECTOMY  1978  . APPENDECTOMY    . BREAST BIOPSY Left 1970s; 2015  . BREAST LUMPECTOMY Left 1970s  . BREAST LUMPECTOMY WITH NEEDLE LOCALIZATION Left 07/07/2013   Procedure: BREAST LUMPECTOMY WITH NEEDLE LOCALIZATION;  Surgeon: Merrie Roof, MD;  Location: [place];  Service: General;  Laterality: Left;  . CARDIAC CATHETERIZATION N/A 01/22/2016   Procedure: Left Heart Cath and Coronary Angiography;  Surgeon: Peter M Martinique, MD;  Location: [place] [facility];  Service: Cardiovascular;  Laterality: N/A;  . CARDIAC CATHETERIZATION N/A 01/22/2016   Procedure: Coronary Stent Intervention;  Surgeon: Peter M Martinique, MD;  Location: [place] [facility];  Service: Cardiovascular;  Laterality: N/A;  . CAROTID STENT Left  04/2003   small left parietal and left hemispheric CVA/notes 07/29/2010  . CHOLECYSTECTOMY OPEN    . COLONOSCOPY  2011  . ESOPHAGOGASTRODUODENOSCOPY  08/06/2011   Procedure: ESOPHAGOGASTRODUODENOSCOPY (EGD);  Surgeon: Rogene Houston, MD;  [facility];  Service: Endoscopy;  Laterality: N/A;  . FLEXIBLE SIGMOIDOSCOPY  08/06/2011   Procedure: FLEXIBLE SIGMOIDOSCOPY;  Surgeon: Rogene Houston, MD;  [facility];  Service: Endoscopy;  Laterality: N/A;  . INGUINAL HERNIA REPAIR Left 1970s  . LARYNX SURGERY  1970s   Polyps excised  . MELANOMA EXCISION Left ~ 2016   forearm  . 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   spontaneous rupture    Allergies  Allergen Reactions  . Tape Other (See Comments)    Causes blisters to form  . Latex Rash and Other (See Comments)    Blisters    Current Outpatient Medications on File Prior to Visit  Medication Sig Dispense Refill  . albuterol (PROVENTIL HFA;VENTOLIN HFA) 108 (90 Base) MCG/ACT inhaler Inhale 2 puffs into the lungs every 6 (six) hours as needed for wheezing or shortness of breath.    Marland Kitchen albuterol (PROVENTIL) (2.5 MG/3ML) 0.083% nebulizer solution Take 2.5 mg by nebulization every 6 (six) hours as needed for wheezing or shortness of breath.    Marland Kitchen amLODipine (NORVASC) 10 MG tablet TAKE ONE TABLET BY MOUTH ONCE DAILY. (Patient taking differently: Take 10 mg by mouth daily. ) 90 tablet 1  . aspirin EC 81 MG tablet Take 1 tablet (81 mg total) by mouth daily.    Marland Kitchen atorvastatin (LIPITOR) 80 MG tablet TAKE ONE TABLET BY MOUTH DAILY AT 6 P.M. 90 tablet 0  . azaTHIOprine (IMURAN) 50 MG tablet Take 1.5 tablets (75 mg total) by mouth daily. 60 tablet 3  . carvedilol (COREG) 3.125 MG tablet Take 1 tablet (3.125 mg total) by mouth 2 (two) times daily with a meal. 180 tablet 3  . cetirizine (ZYRTEC) 10 MG tablet Take 10 mg by mouth daily.    . clorazepate (TRANXENE) 7.5 MG tablet Take 7.5 mg by mouth 3 (three) times daily.     . furosemide (LASIX) 40  MG tablet Take 40 mg by mouth daily as needed for fluid.     Marland Kitchen HYDROcodone-acetaminophen (NORCO) 10-325 MG tablet Take 1 tablet by mouth 3 (three) times daily as needed for moderate pain.     . isosorbide mononitrate (ISMO,MONOKET) 20 MG tablet Take 20 mg by mouth 2 (two) times daily at 10 AM and 5 PM.    . levothyroxine (SYNTHROID, LEVOTHROID) 50 MCG tablet Take 50 mcg by mouth daily before breakfast.     . nitroGLYCERIN (NITROSTAT) 0.4 MG SL tablet Place 0.4 mg under the tongue  every 5 (five) minutes as needed for chest pain.     . pantoprazole (PROTONIX) 40 MG tablet Take 40 mg by mouth daily.    . potassium chloride SA (K-DUR,KLOR-CON) 20 MEQ tablet Take 20 mEq by mouth daily as needed (when Lasix 33m is taken).     .Marland Kitchensenna (SENOKOT) 8.6 MG tablet Take 1 tablet by mouth at bedtime.     .Marland KitchenSPIRIVA HANDIHALER 18 MCG inhalation capsule INHALE 1 CAPSULE BY MOUTH DAILY. (Patient taking differently: Place 18 mcg into inhaler and inhale daily. ) 30 capsule 3  . ticagrelor (BRILINTA) 90 MG TABS tablet Take 1 tablet (90 mg total) by mouth 2 (two) times daily. 60 tablet 11  . lisinopril (PRINIVIL,ZESTRIL) 20 MG tablet Take 1 tablet (20 mg total) by mouth daily. 90 tablet 1   No current facility-administered medications on file prior to visit.         Objective:   Physical Exam Blood pressure 140/70, pulse 80, temperature 98.1 F (36.7 C), height 5' 3" (1.6 m), weight 204 lb 8 oz (92.8 kg). Alert and oriented. Skin warm and dry. Oral mucosa is moist.   . Sclera anicteric, conjunctivae is pink. Thyroid not enlarged. No cervical lymphadenopathy. Lungs clear. Heart regular rate and rhythm.  Abdomen is soft. Bowel sounds are positive. No hepatomegaly. No abdominal masses felt. No tenderness.  No edema to lower extremities.           Assessment & Plan:  Auto immune hepatitis. Will get a CBC and Hepatic function. OV in 4 months.

## 2018-03-05 ENCOUNTER — Other Ambulatory Visit: Payer: Self-pay

## 2018-03-05 ENCOUNTER — Emergency Department (HOSPITAL_COMMUNITY)
Admission: EM | Admit: 2018-03-05 | Discharge: 2018-03-05 | Disposition: A | Discharge status: Home / Self Care | Payer: Medicare Other | Attending: Emergency Medicine | Admitting: Emergency Medicine

## 2018-03-05 ENCOUNTER — Encounter (HOSPITAL_COMMUNITY): Payer: Self-pay | Admitting: Emergency Medicine

## 2018-03-05 DIAGNOSIS — J449 Chronic obstructive pulmonary disease, unspecified: Secondary | ICD-10-CM | POA: Diagnosis not present

## 2018-03-05 DIAGNOSIS — I10 Essential (primary) hypertension: Secondary | ICD-10-CM | POA: Diagnosis not present

## 2018-03-05 DIAGNOSIS — J029 Acute pharyngitis, unspecified: Secondary | ICD-10-CM | POA: Diagnosis not present

## 2018-03-05 DIAGNOSIS — Z87891 Personal history of nicotine dependence: Secondary | ICD-10-CM | POA: Insufficient documentation

## 2018-03-05 DIAGNOSIS — H6592 Unspecified nonsuppurative otitis media, left ear: Secondary | ICD-10-CM

## 2018-03-05 DIAGNOSIS — H9202 Otalgia, left ear: Secondary | ICD-10-CM | POA: Diagnosis present

## 2018-03-05 DIAGNOSIS — H65192 Other acute nonsuppurative otitis media, left ear: Secondary | ICD-10-CM | POA: Diagnosis not present

## 2018-03-05 DIAGNOSIS — Z7982 Long term (current) use of aspirin: Secondary | ICD-10-CM | POA: Diagnosis not present

## 2018-03-05 DIAGNOSIS — E039 Hypothyroidism, unspecified: Secondary | ICD-10-CM | POA: Diagnosis not present

## 2018-03-05 DIAGNOSIS — Z79899 Other long term (current) drug therapy: Secondary | ICD-10-CM | POA: Diagnosis not present

## 2018-03-05 MED ORDER — MAGIC MOUTHWASH W/LIDOCAINE: 5.0000 mL | Freq: Three times a day (TID) | ORAL | 0 refills | Status: DC | PRN

## 2018-03-05 MED ORDER — AMOXICILLIN 500 MG PO CAPS: 500.0000 mg | ORAL_CAPSULE | Freq: Three times a day (TID) | ORAL | 0 refills | Status: DC

## 2018-03-05 NOTE — Discharge Instructions (Addendum)
Tylenol every 4 hrs if needed for fever.  Take the antibiotic as directed until its finished.  Follow-up with your doctor for recheck.

## 2018-03-05 NOTE — ED Notes (Signed)
Pt reports ST and L ear pain for the last 2 days   Hx of cig abuse as well as Ca   Here for eval    Has not tried OTC meds due to multiplicity of meds she is on

## 2018-03-05 NOTE — ED Notes (Signed)
TT in to assess 

## 2018-03-05 NOTE — ED Triage Notes (Signed)
Sore throat and lt ear pain x 2 days

## 2018-03-05 NOTE — ED Provider Notes (Signed)
[facility] EMERGENCY DEPARTMENT Provider Note   CSN: [id number] Arrival date & time: 03/05/18  1614     History   Chief Complaint Chief Complaint  Patient presents with  . Sore Throat    HPI [name] is a 67 y.o. female.  HPI  [name] is a 67 y.o. female who presents to the Emergency Department complaining of left earache and sore throat.  Symptoms have been present for 2 days.  She states she noticed a fullness and pressure sensation to her left ear initially, then the following morning she woke with a sore throat.  She also endorses a mild cough that is nonproductive.  No chest pain, fever, shortness of breath, dizziness, headache or vomiting.  Hearing is slightly diminished in the left ear.  She states that she takes allergy medications daily and thought maybe her symptoms were related to her sinuses.   Past Medical History:  Diagnosis Date  . Anxiety   . Arthritis    "hands, arms, back, neck, knees, fingers" (01/21/2016)  . Atrial fibrillation (HCC)    ASA daiy; came off Coumadin shortly after stroke  . Breast cancer, left breast ([place]) 1970s; 2015  . Cancer of skin of leg    BLE  . 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HCC)    left  . Chronic lower back pain   . Chronic pain   . COPD (chronic obstructive pulmonary disease) ([place])   . Coronary artery disease   . CVA (cerebral vascular accident) [facility]) pt denies right brain CVA   Right brain secondary to right internal carotid artery dissection for which 2 stents were placed;F/u cerebral angiography in 05/2008-minimal plaque; healing of previously identified left internal carotid artery dissection  . Depression   . GERD (gastroesophageal reflux disease)   . Heart murmur   . Hepatic cirrhosis ([place]) 12/20/2017  . History of blood transfusion    "related to spleen"  . History of hiatal hernia   . History of kidney stones   . Hyperlipidemia   . Hypertension   . Hypothyroidism   . Kidney stone   . Melanoma of  forearm, left ([place])   . Migraine    "a few migraines/year" (01/21/2016)  . NSTEMI (non-ST elevated myocardial infarction) ([place]) 01/21/2016   Archie Endo 01/21/2016  . Obesity   . Stroke [facility]) 04/2003   2 carotid artery stents in place; small left parietal and left hemispheric CVA.Phillis Knack 01/21/2016  . Tobacco use disorder    50 pack years; questionably discontinued in 2010  . Wears glasses     Patient Active Problem List   Diagnosis Date Noted  . Hepatic cirrhosis ([place]) 12/20/2017  . Bradycardia 08/21/2016  . NSTEMI (non-ST elevation myocardial infarction) ([place]) 01/21/2016  . Chronic diastolic CHF (congestive heart failure) ([place]) 01/01/2016  . Symptomatic bradycardia 09/30/2015  . CVA (cerebral infarction) 09/30/2015  . 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 Essential hypertension 09/30/2015  . Chronic pain 09/30/2015  . Hyperlipidemia 09/30/2015  . Surgical menopause on hormone replacement therapy 09/30/2015  . GERD (gastroesophageal reflux disease) 09/30/2015  . Paroxysmal atrial fibrillation ([place]) 09/30/2015  . Hypothyroidism 09/30/2015  . Complete heart block ([place]) 09/30/2015  . Breast mass 04/27/2013  . Deltoid tendinitis of left shoulder 11/23/2012  . Rotator cuff tear 10/06/2012  . Rotator cuff syndrome of left shoulder 10/06/2012  . Pain in joint, shoulder region 10/06/2012  . Tendonitis, calcific, shoulder 07/26/2012  . CVA (cerebral infarction)   . COPD (chronic obstructive pulmonary disease) ([place])   .  Hypertension   . Hyperlipidemia   . Obesity   . Chest tightness   . Tobacco use disorder     Past Surgical History:  Procedure Laterality Date  . ABDOMINAL HYSTERECTOMY  1978  . APPENDECTOMY    . BREAST BIOPSY Left 1970s; 2015  . BREAST LUMPECTOMY Left 1970s  . BREAST LUMPECTOMY WITH NEEDLE LOCALIZATION Left 07/07/2013   Procedure: BREAST LUMPECTOMY WITH NEEDLE LOCALIZATION;  Surgeon: Merrie Roof, MD;  Location: [place];  Service: General;  Laterality:  Left;  . CARDIAC CATHETERIZATION N/A 01/22/2016   Procedure: Left Heart Cath and Coronary Angiography;  Surgeon: Peter M Martinique, MD;  Location: [place] CV LAB;  Service: Cardiovascular;  Laterality: N/A;  . CARDIAC CATHETERIZATION N/A 01/22/2016   Procedure: Coronary Stent Intervention;  Surgeon: Peter M Martinique, MD;  Location: [place] CV LAB;  Service: Cardiovascular;  Laterality: N/A;  . CAROTID STENT Left 04/2003   small left parietal and left hemispheric CVA/notes 07/29/2010  . CHOLECYSTECTOMY OPEN    . COLONOSCOPY  2011  . ESOPHAGOGASTRODUODENOSCOPY  08/06/2011   Procedure: ESOPHAGOGASTRODUODENOSCOPY (EGD);  Surgeon: Rogene Houston, MD;  [facility];  Service: Endoscopy;  Laterality: N/A;  . FLEXIBLE SIGMOIDOSCOPY  08/06/2011   Procedure: FLEXIBLE SIGMOIDOSCOPY;  Surgeon: Rogene Houston, MD;  [facility];  Service: Endoscopy;  Laterality: N/A;  . INGUINAL HERNIA REPAIR Left 1970s  . LARYNX SURGERY  1970s   Polyps excised  . MELANOMA EXCISION Left ~ 2016   forearm  . 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   spontaneous rupture     OB History    Gravida  2   Para  2   Term  2   Preterm  0   AB  0   Living  2     SAB  0   TAB  0   Ectopic  0   Multiple      Live Births               Home Medications    Prior to Admission medications   Medication Sig Start Date End Date Taking? Authorizing Provider  albuterol (PROVENTIL HFA;VENTOLIN HFA) 108 (90 Base) MCG/ACT inhaler Inhale 2 puffs into the lungs every 6 (six) hours as needed for wheezing or shortness of breath.    [provider]  albuterol (PROVENTIL) (2.5 MG/3ML) 0.083% nebulizer solution Take 2.5 mg by nebulization every 6 (six) hours as needed for wheezing or shortness of breath.    [provider]  amLODipine (NORVASC) 10 MG tablet TAKE ONE TABLET BY MOUTH ONCE DAILY. Patient taking differently: Take 10 mg by mouth daily.  10/15/17   Arnoldo Lenis, MD  amoxicillin  (AMOXIL) 500 MG capsule Take 1 capsule (500 mg total) by mouth 3 (three) times daily. 03/05/18   Graci Hulce, PA-C  aspirin EC 81 MG tablet Take 1 tablet (81 mg total) by mouth daily. 01/24/16   Arbutus Leas, NP  atorvastatin (LIPITOR) 80 MG tablet TAKE ONE TABLET BY MOUTH DAILY AT 6 P.M. 03/01/18   Arnoldo Lenis, MD  azaTHIOprine (IMURAN) 50 MG tablet Take 1.5 tablets (75 mg total) by mouth daily. 02/24/18   Setzer, Rona Ravens, NP  carvedilol (COREG) 3.125 MG tablet Take 1 tablet (3.125 mg total) by mouth 2 (two) times daily with a meal. 06/18/17   Branch, Alphonse Guild, MD  cetirizine (ZYRTEC) 10 MG tablet Take 10 mg by mouth daily.  [provider]  clorazepate (TRANXENE) 7.5 MG tablet Take 7.5 mg by mouth 3 (three) times daily.  01/03/16   [provider]  furosemide (LASIX) 40 MG tablet Take 40 mg by mouth daily as needed for fluid.     [provider]  HYDROcodone-acetaminophen (NORCO) 10-325 MG tablet Take 1 tablet by mouth 3 (three) times daily as needed for moderate pain.     [provider]  isosorbide mononitrate (ISMO,MONOKET) 20 MG tablet Take 20 mg by mouth 2 (two) times daily at 10 AM and 5 PM.    [provider]  levothyroxine (SYNTHROID, LEVOTHROID) 50 MCG tablet Take 50 mcg by mouth daily before breakfast.  07/20/14   [provider]  lisinopril (PRINIVIL,ZESTRIL) 20 MG tablet Take 1 tablet (20 mg total) by mouth daily. 05/04/17 01/26/18  Arnoldo Lenis, MD  magic mouthwash w/lidocaine SOLN Take 5 mLs by mouth 3 (three) times daily as needed for mouth pain. Swish and spit, do not swallow 03/05/18   Perri Aragones, PA-C  nitroGLYCERIN (NITROSTAT) 0.4 MG SL tablet Place 0.4 mg under the tongue every 5 (five) minutes as needed for chest pain.     [provider]  pantoprazole (PROTONIX) 40 MG tablet Take 40 mg by mouth daily. 09/13/15   [provider]  potassium chloride SA (K-DUR,KLOR-CON) 20 MEQ tablet Take  20 mEq by mouth daily as needed (when Lasix 40mg  is taken).  08/19/15   [provider]  senna (SENOKOT) 8.6 MG tablet Take 1 tablet by mouth at bedtime.     [provider]  SPIRIVA HANDIHALER 18 MCG inhalation capsule INHALE 1 CAPSULE BY MOUTH DAILY. Patient taking differently: Place 18 mcg into inhaler and inhale daily.  11/16/17   Arnoldo Lenis, MD  ticagrelor (BRILINTA) 90 MG TABS tablet Take 1 tablet (90 mg total) by mouth 2 (two) times daily. 02/14/18   Arnoldo Lenis, MD    Family History Family History  Problem Relation Age of Onset  . Heart failure Mother   . Cancer Father   . Heart failure Father   . Cancer Sister   . Dementia Sister   . Heart disease Other   . Arthritis Other   . Cancer Other   . Diabetes Other   . Kidney disease Other   . Cancer Sister   . Heart failure Brother     Social History Social History   Tobacco Use  . Smoking status: Former Smoker    Packs/day: 1.00    Years: 40.00    Pack years: 40.00    Types: Cigarettes    Last attempt to quit: 07/06/2007    Years since quitting: 10.6  . Smokeless tobacco: Never Used  Substance Use Topics  . Alcohol use: No    Alcohol/week: 0.0 standard drinks  . Drug use: No     Allergies   Tape and Latex   Review of Systems Review of Systems  Constitutional: Negative for appetite change, chills and fever.  HENT: Positive for congestion, ear pain and sore throat. Negative for trouble swallowing and voice change.   Eyes: Negative for visual disturbance.  Respiratory: Positive for cough. Negative for chest tightness, shortness of breath and wheezing.   Cardiovascular: Negative for chest pain.  Gastrointestinal: Negative for abdominal pain, nausea and vomiting.  Genitourinary: Negative for dysuria.  Musculoskeletal: Negative for arthralgias and myalgias.  Skin: Negative for rash.  Neurological: Negative for dizziness, syncope, weakness, numbness and headaches.  Hematological:  Negative for adenopathy.     Physical Exam Updated Vital Signs BP (!) 142/72 (BP Location: Right Arm)   Pulse 89   Temp 98.2 F (36.8 C) (Oral)   Resp (!) 22   Ht 5\' 3"  (1.6 m)   Wt 92 kg   SpO2 100%   BMI 35.93 kg/m   Physical Exam Vitals signs and nursing note reviewed.  Constitutional:      General: She is not in acute distress.    Appearance: She is well-developed.  HENT:     Head: Normocephalic and atraumatic.     Jaw: No trismus.     Right Ear: Tympanic membrane and ear canal normal.     Left Ear: Ear canal normal. A middle ear effusion is present. No mastoid tenderness. Tympanic membrane is erythematous. Tympanic membrane is not retracted or bulging.     Mouth/Throat:     Mouth: Mucous membranes are moist.     Pharynx: Uvula midline. Posterior oropharyngeal erythema present. No oropharyngeal exudate or uvula swelling.     Tonsils: No tonsillar abscesses.  Neck:     Musculoskeletal: Normal range of motion and neck supple.  Cardiovascular:     Rate and Rhythm: Normal rate and regular rhythm.     Heart sounds: Normal heart sounds.  Pulmonary:     Effort: Pulmonary effort is normal.     Breath sounds: Normal breath sounds.  Abdominal:     Palpations: There is no splenomegaly.     Tenderness: There is no abdominal tenderness.  Musculoskeletal: Normal range of motion.  Lymphadenopathy:     Cervical: No cervical adenopathy.  Skin:    General: Skin is warm.     Findings: No rash.  Neurological:     Mental Status: She is alert. Mental status is at baseline.     Motor: No abnormal muscle tone.     Coordination: Coordination normal.     Gait: Gait normal.  Psychiatric:        Mood and Affect: Mood normal.      ED Treatments / Results  Labs (all labs ordered are listed, but only abnormal results are displayed) Labs Reviewed - No data to display  EKG None  Radiology No results found.  Procedures Procedures (including critical care time)  Medications  Ordered in ED Medications - No data to display   Initial Impression / Assessment and Plan / ED Course  I have reviewed the triage vital signs and the nursing notes.  Pertinent labs & imaging results that were available during my care of the patient were reviewed by me and considered in my medical decision making (see chart for details).     Patient well-appearing.  Vitals reviewed.  She is nontoxic-appearing.  Left otitis media is present.  No concerning symptoms for peritonsillar or retropharyngeal abscess.  Airway patent.  Patient agrees to treatment plan with Magic mouthwash and amoxicillin.  Tylenol if needed for fever.  She appears appropriate for discharge home and agrees to plan.  Final Clinical Impressions(s) / ED Diagnoses   Final diagnoses:  Left non-suppurative otitis media  Acute pharyngitis, unspecified etiology    ED Discharge Orders         Ordered    amoxicillin (AMOXIL) 500 MG capsule  3 times daily     03/05/18 1647    magic mouthwash w/lidocaine SOLN  3 times daily PRN     03/05/18 1647           Kermit Arnette, [place],  PA-C 03/05/18 1717    Milton Ferguson, MD 03/05/18 [id number]

## 2018-03-07 ENCOUNTER — Encounter (INDEPENDENT_AMBULATORY_CARE_PROVIDER_SITE_OTHER): Payer: Self-pay | Admitting: *Deleted

## 2018-03-07 ENCOUNTER — Other Ambulatory Visit (INDEPENDENT_AMBULATORY_CARE_PROVIDER_SITE_OTHER): Payer: Self-pay | Admitting: *Deleted

## 2018-03-07 DIAGNOSIS — K754 Autoimmune hepatitis: Secondary | ICD-10-CM

## 2018-03-19 ENCOUNTER — Emergency Department (HOSPITAL_COMMUNITY): Payer: Medicare Other

## 2018-03-19 ENCOUNTER — Encounter (HOSPITAL_COMMUNITY): Payer: Self-pay | Admitting: *Deleted

## 2018-03-19 ENCOUNTER — Emergency Department (HOSPITAL_COMMUNITY)
Admission: EM | Admit: 2018-03-19 | Discharge: 2018-03-19 | Disposition: A | Discharge status: Home / Self Care | Payer: Medicare Other | Attending: Emergency Medicine | Admitting: Emergency Medicine

## 2018-03-19 ENCOUNTER — Other Ambulatory Visit: Payer: Self-pay

## 2018-03-19 DIAGNOSIS — J449 Chronic obstructive pulmonary disease, unspecified: Secondary | ICD-10-CM | POA: Insufficient documentation

## 2018-03-19 DIAGNOSIS — J029 Acute pharyngitis, unspecified: Secondary | ICD-10-CM | POA: Diagnosis present

## 2018-03-19 DIAGNOSIS — R448 Other symptoms and signs involving general sensations and perceptions: Secondary | ICD-10-CM | POA: Diagnosis not present

## 2018-03-19 DIAGNOSIS — E039 Hypothyroidism, unspecified: Secondary | ICD-10-CM | POA: Insufficient documentation

## 2018-03-19 DIAGNOSIS — I10 Essential (primary) hypertension: Secondary | ICD-10-CM | POA: Diagnosis not present

## 2018-03-19 DIAGNOSIS — I4891 Unspecified atrial fibrillation: Secondary | ICD-10-CM | POA: Diagnosis not present

## 2018-03-19 DIAGNOSIS — Z7982 Long term (current) use of aspirin: Secondary | ICD-10-CM | POA: Diagnosis not present

## 2018-03-19 DIAGNOSIS — I2510 Atherosclerotic heart disease of native coronary artery without angina pectoris: Secondary | ICD-10-CM | POA: Diagnosis not present

## 2018-03-19 DIAGNOSIS — Z87891 Personal history of nicotine dependence: Secondary | ICD-10-CM | POA: Insufficient documentation

## 2018-03-19 DIAGNOSIS — R0989 Other specified symptoms and signs involving the circulatory and respiratory systems: Secondary | ICD-10-CM

## 2018-03-19 DIAGNOSIS — Z79899 Other long term (current) drug therapy: Secondary | ICD-10-CM | POA: Insufficient documentation

## 2018-03-19 LAB — I-STAT CHEM 8, ED
BUN: 12 mg/dL (ref 8–23)
Calcium, Ion: 1.24 mmol/L (ref 1.15–1.40)
Chloride: 99 mmol/L (ref 98–111)
Creatinine, Ser: 0.7 mg/dL (ref 0.44–1.00)
Glucose, Bld: 282 mg/dL — ABNORMAL HIGH (ref 70–99)
HCT: 41 % (ref 36.0–46.0)
Hemoglobin: 13.9 g/dL (ref 12.0–15.0)
Potassium: 3.6 mmol/L (ref 3.5–5.1)
Sodium: 138 mmol/L (ref 135–145)
TCO2: 29 mmol/L (ref 22–32)

## 2018-03-19 MED ORDER — IOHEXOL 300 MG/ML  SOLN
75.0000 mL | Freq: Once | INTRAMUSCULAR | Status: AC | PRN
  Administered 2018-03-19: 75 mL via INTRAVENOUS

## 2018-03-19 NOTE — ED Triage Notes (Addendum)
Pt c/o pressure in right side of throat causing her to feel as if "something is stuck" and making her gag/vomit. Pt reports this has been going on since Thursday, worsening over night. Pt reports she has been able to eat and drink but feels more nauseated after she eats. Pt reports she was diagnosed with an ear and throat infection on 03/05/18 and completed her course of antibiotics at that time and began to feel better. Pt reports she had throat soreness at that time but that it started getting worse this past Thursday. Pt saw her PCP this past week and was told to "just watch it". Pt able to maintain oral secretions. Denies difficulty breathing.

## 2018-03-19 NOTE — ED Notes (Signed)
From Rad 

## 2018-03-19 NOTE — ED Notes (Signed)
Pt requested crackers, per Dr. Sabra Heck pt is ok to have crackers. Graham crackers given to pt.

## 2018-03-19 NOTE — ED Notes (Signed)
In to start IV Pt reports she cannot have a CT Due to stints  CT informed and into room to discuss w pt

## 2018-03-19 NOTE — Discharge Instructions (Addendum)
Your testing has not shown any specific abnormalities of concern - there is no foreign body, no swelling, and no other acute problems that are seen - continue to follow up with the ENT - see the phone number for Dr. Constance Holster above - however ER if you get worsened symptoms.

## 2018-03-19 NOTE — ED Provider Notes (Signed)
[facility] EMERGENCY DEPARTMENT Provider Note   CSN: [id number] Arrival date & time: 03/19/18  1245     History   Chief Complaint Chief Complaint  Patient presents with  . Sore Throat    HPI [name] is a 68 y.o. female.  HPI  The patient is a very pleasant 68 year old female, she does have a history of atrial fibrillation, she has had breast cancer, she has had a history of COPD and had a recent history of an emergency department visit because of a sore throat and ear pain which resulted in amoxicillin and Magic mouthwash for her apparent otitis media.  She reports that her pain got better, her sore throat got better but because she was still having symptoms of a mild sore throat she was seen at her doctor's office on Monday.  Dr. Karie Kirks, her primary care physician recommended conservative observation without any other formal antibiotics due to the fact that this had improved significantly and did not appear to be bacterial (according to the patient).  She states that she is continued to improve and the hoarseness that she had thinking this was laryngitis has also improved.  That being said 2 days ago she developed increasing feeling of an abnormal foreign body sensation in the right side of her throat, it is causing her to gag, when she swallows it feels like it is going down when she gags it feels like it is coming up but it is there persistently.  It does not cause pain, fever or drainage and she denies any difficulty breathing and when she swallows she has no difficulty swallowing.  She is not vomiting.  She just feels like something is gagging in the back of her throat.  She denies swallowing a foreign body, she denies having pain or discomfort after swallowing something a couple of days ago.  She reports being compliant with her medications.  She denies fevers chills and at this point has no sore throat, stuffy nose, coughing, swelling, nausea vomiting or diarrhea.  Past  Medical History:  Diagnosis Date  . Anxiety   . Arthritis    "hands, arms, back, neck, knees, fingers" (01/21/2016)  . Atrial fibrillation (HCC)    ASA daiy; came off Coumadin shortly after stroke  . Breast cancer, left breast ([place]) 1970s; 2015  . Cancer of skin of leg    BLE  . 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HCC)    left  . Chronic lower back pain   . Chronic pain   . COPD (chronic obstructive pulmonary disease) ([place])   . Coronary artery disease   . CVA (cerebral vascular accident) [facility]) pt denies right brain CVA   Right brain secondary to right internal carotid artery dissection for which 2 stents were placed;F/u cerebral angiography in 05/2008-minimal plaque; healing of previously identified left internal carotid artery dissection  . Depression   . GERD (gastroesophageal reflux disease)   . Heart murmur   . Hepatic cirrhosis ([place]) 12/20/2017  . History of blood transfusion    "related to spleen"  . History of hiatal hernia   . History of kidney stones   . Hyperlipidemia   . Hypertension   . Hypothyroidism   . Kidney stone   . Melanoma of forearm, left ([place])   . Migraine    "a few migraines/year" (01/21/2016)  . NSTEMI (non-ST elevated myocardial infarction) ([place]) 01/21/2016   Archie Endo 01/21/2016  . Obesity   . Stroke [facility]) 04/2003   2 carotid artery stents  in place; small left parietal and left hemispheric CVA.Phillis Knack 01/21/2016  . Tobacco use disorder    50 pack years; questionably discontinued in 2010  . Wears glasses     Patient Active Problem List   Diagnosis Date Noted  . Hepatic cirrhosis ([place]) 12/20/2017  . Bradycardia 08/21/2016  . NSTEMI (non-ST elevation myocardial infarction) ([place]) 01/21/2016  . Chronic diastolic CHF (congestive heart failure) ([place]) 01/01/2016  . Symptomatic bradycardia 09/30/2015  . CVA (cerebral infarction) 09/30/2015  . 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 Essential hypertension 09/30/2015  . Chronic pain 09/30/2015  . Hyperlipidemia 09/30/2015  .  Surgical menopause on hormone replacement therapy 09/30/2015  . GERD (gastroesophageal reflux disease) 09/30/2015  . Paroxysmal atrial fibrillation ([place]) 09/30/2015  . Hypothyroidism 09/30/2015  . Complete heart block ([place]) 09/30/2015  . Breast mass 04/27/2013  . Deltoid tendinitis of left shoulder 11/23/2012  . Rotator cuff tear 10/06/2012  . Rotator cuff syndrome of left shoulder 10/06/2012  . Pain in joint, shoulder region 10/06/2012  . Tendonitis, calcific, shoulder 07/26/2012  . CVA (cerebral infarction)   . COPD (chronic obstructive pulmonary disease) ([place])   . Hypertension   . Hyperlipidemia   . Obesity   . Chest tightness   . Tobacco use disorder     Past Surgical History:  Procedure Laterality Date  . ABDOMINAL HYSTERECTOMY  1978  . APPENDECTOMY    . BREAST BIOPSY Left 1970s; 2015  . BREAST LUMPECTOMY Left 1970s  . BREAST LUMPECTOMY WITH NEEDLE LOCALIZATION Left 07/07/2013   Procedure: BREAST LUMPECTOMY WITH NEEDLE LOCALIZATION;  Surgeon: Merrie Roof, MD;  Location: [place];  Service: General;  Laterality: Left;  . CARDIAC CATHETERIZATION N/A 01/22/2016   Procedure: Left Heart Cath and Coronary Angiography;  Surgeon: Peter M Martinique, MD;  Location: [place] CV LAB;  Service: Cardiovascular;  Laterality: N/A;  . CARDIAC CATHETERIZATION N/A 01/22/2016   Procedure: Coronary Stent Intervention;  Surgeon: Peter M Martinique, MD;  Location: [place] CV LAB;  Service: Cardiovascular;  Laterality: N/A;  . CAROTID STENT Left 04/2003   small left parietal and left hemispheric CVA/notes 07/29/2010  . CHOLECYSTECTOMY OPEN    . COLONOSCOPY  2011  . ESOPHAGOGASTRODUODENOSCOPY  08/06/2011   Procedure: ESOPHAGOGASTRODUODENOSCOPY (EGD);  Surgeon: Rogene Houston, MD;  [facility];  Service: Endoscopy;  Laterality: N/A;  . FLEXIBLE SIGMOIDOSCOPY  08/06/2011   Procedure: FLEXIBLE SIGMOIDOSCOPY;  Surgeon: Rogene Houston, MD;  [facility];   Service: Endoscopy;  Laterality: N/A;  . INGUINAL HERNIA REPAIR Left 1970s  . LARYNX SURGERY  1970s   Polyps excised  . MELANOMA EXCISION Left ~ 2016   forearm  . 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   spontaneous rupture     OB History    Gravida  2   Para  2   Term  2   Preterm  0   AB  0   Living  2     SAB  0   TAB  0   Ectopic  0   Multiple      Live Births               Home Medications    Prior to Admission medications   Medication Sig Start Date End Date Taking? Authorizing Provider  albuterol (PROVENTIL HFA;VENTOLIN HFA) 108 (90 Base) MCG/ACT inhaler Inhale 2 puffs into the lungs every 6 (six) hours as needed for wheezing or shortness of breath.    [provider]  albuterol (PROVENTIL) (2.5 MG/3ML) 0.083% nebulizer solution Take 2.5 mg by nebulization every 6 (six) hours as needed for wheezing or shortness of breath.    [provider]  amLODipine (NORVASC) 10 MG tablet TAKE ONE TABLET BY MOUTH ONCE DAILY. Patient taking differently: Take 10 mg by mouth daily.  10/15/17   Arnoldo Lenis, MD  amoxicillin (AMOXIL) 500 MG capsule Take 1 capsule (500 mg total) by mouth 3 (three) times daily. 03/05/18   Triplett, Tammy, PA-C  aspirin EC 81 MG tablet Take 1 tablet (81 mg total) by mouth daily. 01/24/16   Arbutus Leas, NP  atorvastatin (LIPITOR) 80 MG tablet TAKE ONE TABLET BY MOUTH DAILY AT 6 P.M. 03/01/18   Arnoldo Lenis, MD  azaTHIOprine (IMURAN) 50 MG tablet Take 1.5 tablets (75 mg total) by mouth daily. 02/24/18   Setzer, Rona Ravens, NP  carvedilol (COREG) 3.125 MG tablet Take 1 tablet (3.125 mg total) by mouth 2 (two) times daily with a meal. 06/18/17   Branch, Alphonse Guild, MD  cetirizine (ZYRTEC) 10 MG tablet Take 10 mg by mouth daily.    [provider]  clorazepate (TRANXENE) 7.5 MG tablet Take 7.5 mg by mouth 3 (three) times daily.  01/03/16   [provider]  furosemide (LASIX) 40 MG tablet Take 40 mg by mouth daily as  needed for fluid.     [provider]  HYDROcodone-acetaminophen (NORCO) 10-325 MG tablet Take 1 tablet by mouth 3 (three) times daily as needed for moderate pain.     [provider]  isosorbide mononitrate (ISMO,MONOKET) 20 MG tablet Take 20 mg by mouth 2 (two) times daily at 10 AM and 5 PM.    [provider]  levothyroxine (SYNTHROID, LEVOTHROID) 50 MCG tablet Take 50 mcg by mouth daily before breakfast.  07/20/14   [provider]  lisinopril (PRINIVIL,ZESTRIL) 20 MG tablet Take 1 tablet (20 mg total) by mouth daily. 05/04/17 01/26/18  Arnoldo Lenis, MD  magic mouthwash w/lidocaine SOLN Take 5 mLs by mouth 3 (three) times daily as needed for mouth pain. Swish and spit, do not swallow 03/05/18   Triplett, Tammy, PA-C  nitroGLYCERIN (NITROSTAT) 0.4 MG SL tablet Place 0.4 mg under the tongue every 5 (five) minutes as needed for chest pain.     [provider]  pantoprazole (PROTONIX) 40 MG tablet Take 40 mg by mouth daily. 09/13/15   [provider]  potassium chloride SA (K-DUR,KLOR-CON) 20 MEQ tablet Take 20 mEq by mouth daily as needed (when Lasix 40mg  is taken).  08/19/15   [provider]  senna (SENOKOT) 8.6 MG tablet Take 1 tablet by mouth at bedtime.     [provider]  SPIRIVA HANDIHALER 18 MCG inhalation capsule INHALE 1 CAPSULE BY MOUTH DAILY. Patient taking differently: Place 18 mcg into inhaler and inhale daily.  11/16/17   Arnoldo Lenis, MD  ticagrelor (BRILINTA) 90 MG TABS tablet Take 1 tablet (90 mg total) by mouth 2 (two) times daily. 02/14/18   Arnoldo Lenis, MD    Family History Family History  Problem Relation Age of Onset  . Heart failure Mother   . Cancer Father   . Heart failure Father   . Cancer Sister   . Dementia Sister   . Heart disease Other   . Arthritis Other   . Cancer Other   . Diabetes Other   . Kidney disease Other   . Cancer Sister   .  Heart failure Brother     Social  History Social History   Tobacco Use  . Smoking status: Former Smoker    Packs/day: 1.00    Years: 40.00    Pack years: 40.00    Types: Cigarettes    Last attempt to quit: 07/06/2007    Years since quitting: 10.7  . Smokeless tobacco: Never Used  Substance Use Topics  . Alcohol use: No    Alcohol/week: 0.0 standard drinks  . Drug use: No     Allergies   Tape and Latex   Review of Systems Review of Systems  All other systems reviewed and are negative.    Physical Exam Updated Vital Signs BP (!) 113/54 (BP Location: Left Arm)   Pulse (!) 56   Temp 98.7 F (37.1 C) (Oral)   Resp 18   Ht 1.6 m (5\' 3" )   Wt 90.7 kg   SpO2 97%   BMI 35.43 kg/m   Physical Exam Vitals signs and nursing note reviewed.  Constitutional:      General: She is not in acute distress.    Appearance: She is well-developed.  HENT:     Head: Normocephalic and atraumatic.     Mouth/Throat:     Pharynx: No oropharyngeal exudate.     Comments: Dentition is intact, the mucous membranes are moist, the posterior pharynx is easily visualized and there is no swelling of the peritonsillar tissues, the soft palate or the uvula.  She has normal phonation, she has no trismus or torticollis and has a normal-appearing posterior pharynx. Eyes:     General: No scleral icterus.       Right eye: No discharge.        Left eye: No discharge.     Conjunctiva/sclera: Conjunctivae normal.     Pupils: Pupils are equal, round, and reactive to light.  Neck:     Musculoskeletal: Normal range of motion and neck supple.     Thyroid: No thyromegaly.     Vascular: No JVD.     Comments: The neck is very supple with no lymphadenopathy, no torticollis, no thyromegaly, trachea is midline, no redness, no tenderness, no fullness Cardiovascular:     Rate and Rhythm: Normal rate and regular rhythm.     Heart sounds: Normal heart sounds. No murmur. No friction rub. No gallop.   Pulmonary:     Effort: Pulmonary effort is  normal. No respiratory distress.     Breath sounds: Normal breath sounds. No wheezing or rales.  Abdominal:     General: Bowel sounds are normal. There is no distension.     Palpations: Abdomen is soft. There is no mass.     Tenderness: There is no abdominal tenderness.  Musculoskeletal: Normal range of motion.        General: No tenderness.  Lymphadenopathy:     Cervical: No cervical adenopathy.  Skin:    General: Skin is warm and dry.     Findings: No erythema or rash.  Neurological:     Mental Status: She is alert.     Coordination: Coordination normal.  Psychiatric:        Behavior: Behavior normal.      ED Treatments / Results  Labs (all labs ordered are listed, but only abnormal results are displayed) Labs Reviewed  I-STAT CHEM 8, ED - Abnormal; Notable for the following components:      Result Value   Glucose, Bld 282 (*)    All other components within  normal limits    EKG None  Radiology Dg Neck Soft Tissue  Result Date: 03/19/2018 CLINICAL DATA:  Feeling of something stuck right side of throat. Symptoms 2 days. EXAM: NECK SOFT TISSUES - 1+ VIEW COMPARISON:  Neck CT 07/20/2014 FINDINGS: Oropharynx clear. Nasopharynx not well-defined. Prevertebral soft tissues are within normal. Epiglottis is somewhat ill-defined and bulbous. Subglottic airway is open. Mild degenerative change of the spine. Evidence of patient's left carotid stent. IMPRESSION: Somewhat bulbous ill-defined epiglottis which may be due to acute inflammation. Subglottic airway is patent. Prevertebral soft tissues are within normal. Electronically Signed   By: Marin Olp M.D.   On: 03/19/2018 14:15   Ct Soft Tissue Neck W Contrast  Result Date: 03/19/2018 CLINICAL DATA:  Neck pain and pressure over the last 2 days. Gagging and vomiting. Right-sided pressure. EXAM: CT NECK WITH CONTRAST TECHNIQUE: Multidetector CT imaging of the neck was performed using the standard protocol following the bolus  administration of intravenous contrast. CONTRAST:  27mL OMNIPAQUE IOHEXOL 300 MG/ML  SOLN COMPARISON:  08/20/2014 FINDINGS: Pharynx and larynx: No evidence of mucosal or submucosal lesion. Salivary glands: Parotid and submandibular glands are normal. Thyroid: Normal Lymph nodes: No enlarged or low-density nodes on either side of the neck. Vascular: No significant arterial or venous finding. Ordinary atherosclerotic change at the carotid bifurcations. Limited intracranial: Normal Visualized orbits: Normal Mastoids and visualized paranasal sinuses: Clear Skeleton: Mid cervical spondylosis and ordinary facet osteoarthritis. Upper chest: Emphysema. Pleural and parenchymal scarring. Other: None IMPRESSION: No cause of the presenting symptoms is identified. No evidence of mass or lymphadenopathy. No evidence of mucosal or submucosal lesion. Electronically Signed   By: Nelson Chimes M.D.   On: 03/19/2018 15:37    Procedures Procedures (including critical care time)  Medications Ordered in ED Medications  iohexol (OMNIPAQUE) 300 MG/ML solution 75 mL (75 mLs Intravenous Contrast Given 03/19/18 1520)     Initial Impression / Assessment and Plan / ED Course  I have reviewed the triage vital signs and the nursing notes.  Pertinent labs & imaging results that were available during my care of the patient were reviewed by me and considered in my medical decision making (see chart for details).  Clinical Course as of Mar 19 1609  Sat Mar 19, 2018  1424 There is some question on the soft tissue x-rays of whether there is some epiglottal swelling, due to this finding will need a CT scan.  That being said the patient is tolerating her secretions and does not appear ill at all.  We will proceed with CT scan   [BM]    Clinical Course User Index [BM] Noemi Chapel, MD    Though the patient is in no distress I am concerned about this feeling of gagging and swelling on the right side of her throat.  There is no  evidence for peritonsillar abscess and she does not position herself in an uncomfortable way or have a fever or toxic appearance as would be suggested if she had a retropharyngeal abscess.  That being said I feel that soft tissue imaging of the neck would be warranted and if not improving she may need ENT for direct laryngoscopy.  The patient is agreeable to the plan, will order imaging and reevaluate.  The patient is agreeable.  Hypotension was no longer seen later in the visit, metabolic panel showed only a slight hyperglycemia but no signs of acidosis and the CT scan of the neck showed no signs of swelling of the  laryngeal tissues.  The patient has maintained her airway without any difficulties and still appears comfortable, I will recommend that she follow-up with the ear nose and throat doctor.  She was given this referral and expressed her understanding.  Stable for discharge  Final Clinical Impressions(s) / ED Diagnoses   Final diagnoses:  Globus sensation    ED Discharge Orders    None       Noemi Chapel, MD 03/19/18 [phone]

## 2018-03-19 NOTE — ED Notes (Signed)
Dr. Miller into see pt

## 2018-03-19 NOTE — ED Notes (Addendum)
Pt transported to radiology by Marylyn Ishihara.

## 2018-03-28 ENCOUNTER — Ambulatory Visit: Payer: Medicare Other | Admitting: Cardiology

## 2018-03-28 ENCOUNTER — Ambulatory Visit (INDEPENDENT_AMBULATORY_CARE_PROVIDER_SITE_OTHER): Payer: Medicare Other | Admitting: Otolaryngology

## 2018-03-28 DIAGNOSIS — R490 Dysphonia: Secondary | ICD-10-CM

## 2018-03-28 DIAGNOSIS — R070 Pain in throat: Secondary | ICD-10-CM | POA: Diagnosis not present

## 2018-03-29 ENCOUNTER — Encounter (INDEPENDENT_AMBULATORY_CARE_PROVIDER_SITE_OTHER): Payer: Self-pay | Admitting: Internal Medicine

## 2018-03-29 ENCOUNTER — Ambulatory Visit (INDEPENDENT_AMBULATORY_CARE_PROVIDER_SITE_OTHER): Payer: Medicare Other | Admitting: Internal Medicine

## 2018-03-29 VITALS — BP 115/64 | HR 78 | Temp 98.3°F | Ht 63.0 in | Wt 203.1 lb

## 2018-03-29 DIAGNOSIS — K754 Autoimmune hepatitis: Secondary | ICD-10-CM | POA: Diagnosis not present

## 2018-03-29 LAB — CBC WITH DIFFERENTIAL/PLATELET
Absolute Monocytes: 288 cells/uL (ref 200–950)
Basophils Absolute: 29 cells/uL (ref 0–200)
Basophils Relative: 0.3 %
EOS PCT: 0 %
Eosinophils Absolute: 0 cells/uL — ABNORMAL LOW (ref 15–500)
HCT: 39.7 % (ref 35.0–45.0)
Hemoglobin: 13.2 g/dL (ref 11.7–15.5)
Lymphs Abs: 749 cells/uL — ABNORMAL LOW (ref 850–3900)
MCH: 29.5 pg (ref 27.0–33.0)
MCHC: 33.2 g/dL (ref 32.0–36.0)
MCV: 88.6 fL (ref 80.0–100.0)
MPV: 11.6 fL (ref 7.5–12.5)
Monocytes Relative: 3 %
Neutro Abs: 8534 cells/uL — ABNORMAL HIGH (ref 1500–7800)
Neutrophils Relative %: 88.9 %
PLATELETS: 305 10*3/uL (ref 140–400)
RBC: 4.48 10*6/uL (ref 3.80–5.10)
RDW: 12.7 % (ref 11.0–15.0)
Total Lymphocyte: 7.8 %
WBC: 9.6 10*3/uL (ref 3.8–10.8)

## 2018-03-29 LAB — HEPATIC FUNCTION PANEL
AG Ratio: 1.3 (calc) (ref 1.0–2.5)
ALT: 19 U/L (ref 6–29)
AST: 18 U/L (ref 10–35)
Albumin: 3.5 g/dL — ABNORMAL LOW (ref 3.6–5.1)
Alkaline phosphatase (APISO): 62 U/L (ref 33–130)
Bilirubin, Direct: 0.1 mg/dL (ref 0.0–0.2)
Globulin: 2.7 g/dL (calc) (ref 1.9–3.7)
Indirect Bilirubin: 0.4 mg/dL (calc) (ref 0.2–1.2)
Total Bilirubin: 0.5 mg/dL (ref 0.2–1.2)
Total Protein: 6.2 g/dL (ref 6.1–8.1)

## 2018-03-29 NOTE — Patient Instructions (Signed)
OV in 6 months. 

## 2018-03-29 NOTE — Progress Notes (Signed)
Subjective:    Patient ID: [name], female    DOB: May 27, 1950, 68 y.o.   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 Maintained on Imuran. She tells me she is doing good. States she is feeling good. BMs are normal. No melena or BRRB.  Taking Prednisone 5mg  daily for the swelling in her throat (Dr. Karie Kirks). Has also see Dr. Benjamine Mola for her sorethroat.  Takes Brilinta for hx of MI and CVA. Hx of cardiac stent, atrial fib   12/21/2017 Ferritin 325, Mitochondrial IgG 23. 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   Review of Systems Past Medical History:  Diagnosis Date  . Anxiety   . Arthritis    "hands, arms, back, neck, knees, fingers" (01/21/2016)  . Atrial fibrillation (HCC)    ASA daiy; came off Coumadin shortly after stroke  . Breast cancer, left breast ([place]) 1970s; 2015  . Cancer of skin of leg    BLE  . 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HCC)    left  . Chronic lower back pain   . Chronic pain   . COPD (chronic obstructive pulmonary disease) ([place])   . Coronary artery disease   . CVA (cerebral vascular accident) [facility]) pt denies right brain CVA   Right brain secondary to right internal carotid artery dissection for which 2 stents were placed;F/u cerebral angiography in 05/2008-minimal plaque; healing of previously identified left internal carotid artery dissection  . Depression   . GERD (gastroesophageal reflux disease)   . Heart murmur   . Hepatic cirrhosis ([place]) 12/20/2017  . Hepatitis   . History of blood transfusion    "related to spleen"  . History of hiatal hernia   . History of kidney stones   . Hyperlipidemia   . Hypertension   . Hypothyroidism   . Kidney stone   . Melanoma of forearm, left  ([place])   . Migraine    "a few migraines/year" (01/21/2016)  . NSTEMI (non-ST elevated myocardial infarction) ([place]) 01/21/2016   Archie Endo 01/21/2016  . Obesity   . Stroke [facility]) 04/2003   2 carotid artery stents in place; small left parietal and left hemispheric CVA.Phillis Knack 01/21/2016  . Tobacco use disorder    50 pack years; questionably discontinued in 2010  . Wears glasses     Past Surgical History:  Procedure Laterality Date  . ABDOMINAL HYSTERECTOMY  1978  . APPENDECTOMY    . BREAST BIOPSY Left 1970s; 2015  . BREAST LUMPECTOMY Left 1970s  . BREAST LUMPECTOMY WITH NEEDLE LOCALIZATION Left 07/07/2013   Procedure: BREAST LUMPECTOMY WITH NEEDLE LOCALIZATION;  Surgeon: Merrie Roof, MD;  Location: [place];  Service: General;  Laterality: Left;  . CARDIAC CATHETERIZATION N/A 01/22/2016   Procedure: Left Heart Cath and Coronary Angiography;  Surgeon: Peter M Martinique, MD;  Location: [place] CV LAB;  Service: Cardiovascular;  Laterality: N/A;  . CARDIAC CATHETERIZATION N/A 01/22/2016   Procedure: Coronary Stent Intervention;  Surgeon: Peter M Martinique, MD;  Location: [place] CV LAB;  Service: Cardiovascular;  Laterality: N/A;  . CAROTID STENT Left 04/2003   small left parietal and left hemispheric CVA/notes 07/29/2010  . CHOLECYSTECTOMY OPEN    . COLONOSCOPY  2011  . ESOPHAGOGASTRODUODENOSCOPY  08/06/2011  Procedure: ESOPHAGOGASTRODUODENOSCOPY (EGD);  Surgeon: Rogene Houston, MD;  [facility];  Service: Endoscopy;  Laterality: N/A;  . FLEXIBLE SIGMOIDOSCOPY  08/06/2011   Procedure: FLEXIBLE SIGMOIDOSCOPY;  Surgeon: Rogene Houston, MD;  [facility];  Service: Endoscopy;  Laterality: N/A;  . INGUINAL HERNIA REPAIR Left 1970s  . LARYNX SURGERY  1970s   Polyps excised  . MELANOMA EXCISION Left ~ 2016   forearm  . 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   spontaneous rupture    Allergies  Allergen Reactions  . Tape Other (See Comments)    Causes blisters to  form  . Latex Rash and Other (See Comments)    Blisters    Current Outpatient Medications on File Prior to Visit  Medication Sig Dispense Refill  . albuterol (PROVENTIL HFA;VENTOLIN HFA) 108 (90 Base) MCG/ACT inhaler Inhale 2 puffs into the lungs every 6 (six) hours as needed for wheezing or shortness of breath.    Marland Kitchen albuterol (PROVENTIL) (2.5 MG/3ML) 0.083% nebulizer solution Take 2.5 mg by nebulization every 6 (six) hours as needed for wheezing or shortness of breath.    Marland Kitchen amLODipine (NORVASC) 10 MG tablet TAKE ONE TABLET BY MOUTH ONCE DAILY. (Patient taking differently: Take 10 mg by mouth daily. ) 90 tablet 1  . aspirin EC 81 MG tablet Take 1 tablet (81 mg total) by mouth daily.    Marland Kitchen atorvastatin (LIPITOR) 80 MG tablet TAKE ONE TABLET BY MOUTH DAILY AT 6 P.M. 90 tablet 0  . azaTHIOprine (IMURAN) 50 MG tablet Take 1.5 tablets (75 mg total) by mouth daily. 60 tablet 3  . carvedilol (COREG) 3.125 MG tablet Take 1 tablet (3.125 mg total) by mouth 2 (two) times daily with a meal. 180 tablet 3  . cetirizine (ZYRTEC) 10 MG tablet Take 10 mg by mouth daily.    . clorazepate (TRANXENE) 7.5 MG tablet Take 7.5 mg by mouth 3 (three) times daily.     . furosemide (LASIX) 40 MG tablet Take 40 mg by mouth daily as needed for fluid.     Marland Kitchen HYDROcodone-acetaminophen (NORCO) 10-325 MG tablet Take 1 tablet by mouth 3 (three) times daily as needed for moderate pain.     . isosorbide mononitrate (ISMO,MONOKET) 20 MG tablet Take 20 mg by mouth 2 (two) times daily at 10 AM and 5 PM.    . levothyroxine (SYNTHROID, LEVOTHROID) 50 MCG tablet Take 50 mcg by mouth daily before breakfast.     . nitroGLYCERIN (NITROSTAT) 0.4 MG SL tablet Place 0.4 mg under the tongue every 5 (five) minutes as needed for chest pain.     . pantoprazole (PROTONIX) 40 MG tablet Take 40 mg by mouth daily.    . potassium chloride SA (K-DUR,KLOR-CON) 20 MEQ tablet Take 20 mEq by mouth daily as needed (when Lasix 40mg  is taken).     Marland Kitchen senna  (SENOKOT) 8.6 MG tablet Take 1 tablet by mouth at bedtime.     Marland Kitchen SPIRIVA HANDIHALER 18 MCG inhalation capsule INHALE 1 CAPSULE BY MOUTH DAILY. (Patient taking differently: Place 18 mcg into inhaler and inhale daily. ) 30 capsule 3  . ticagrelor (BRILINTA) 90 MG TABS tablet Take 1 tablet (90 mg total) by mouth 2 (two) times daily. 60 tablet 11  . lisinopril (PRINIVIL,ZESTRIL) 20 MG tablet Take 1 tablet (20 mg total) by mouth daily. 90 tablet 1  . magic mouthwash w/lidocaine SOLN Take 5 mLs by mouth 3 (three) times daily as needed for mouth pain. Swish and  spit, do not swallow 100 mL 0   No current facility-administered medications on file prior to visit.         Objective:   Physical Exam Blood pressure 115/64, pulse 78, temperature 98.3 F (36.8 C), height 5\' 3"  (1.6 m), weight 203 lb 1.6 oz (92.1 kg). Alert and oriented. Skin warm and dry. Oral mucosa is moist.   . Sclera anicteric, conjunctivae is pink. Thyroid not enlarged. No cervical lymphadenopathy. Lungs clear. Heart regular rate and rhythm.  Abdomen is soft. Bowel sounds are positive. No hepatomegaly. No abdominal masses felt. No tenderness.  No edema to lower extremities.           Assessment & Plan:  Auto immune Hepatitis. Hepatic and CBC today. She is doing well.  OV in 6 months.

## 2018-04-04 ENCOUNTER — Telehealth (INDEPENDENT_AMBULATORY_CARE_PROVIDER_SITE_OTHER): Payer: Self-pay | Admitting: Internal Medicine

## 2018-04-04 NOTE — Telephone Encounter (Signed)
Patient call regarding test results

## 2018-04-05 NOTE — Telephone Encounter (Signed)
Results given to patient,.   Ann, Please send labs to Dr. Karie Kirks office

## 2018-04-05 NOTE — Telephone Encounter (Signed)
Labs faxed to Dr Karie Kirks

## 2018-04-11 ENCOUNTER — Encounter (HOSPITAL_COMMUNITY): Payer: Self-pay | Admitting: Emergency Medicine

## 2018-04-11 ENCOUNTER — Emergency Department (HOSPITAL_COMMUNITY)
Admission: EM | Admit: 2018-04-11 | Discharge: 2018-04-11 | Disposition: A | Discharge status: Home / Self Care | Payer: Medicare Other | Attending: Emergency Medicine | Admitting: Emergency Medicine

## 2018-04-11 ENCOUNTER — Other Ambulatory Visit: Payer: Self-pay

## 2018-04-11 DIAGNOSIS — Z87891 Personal history of nicotine dependence: Secondary | ICD-10-CM | POA: Diagnosis not present

## 2018-04-11 DIAGNOSIS — I110 Hypertensive heart disease with heart failure: Secondary | ICD-10-CM | POA: Diagnosis not present

## 2018-04-11 DIAGNOSIS — R531 Weakness: Secondary | ICD-10-CM | POA: Insufficient documentation

## 2018-04-11 DIAGNOSIS — E039 Hypothyroidism, unspecified: Secondary | ICD-10-CM | POA: Diagnosis not present

## 2018-04-11 DIAGNOSIS — R197 Diarrhea, unspecified: Secondary | ICD-10-CM | POA: Diagnosis not present

## 2018-04-11 DIAGNOSIS — J449 Chronic obstructive pulmonary disease, unspecified: Secondary | ICD-10-CM | POA: Insufficient documentation

## 2018-04-11 DIAGNOSIS — I5032 Chronic diastolic (congestive) heart failure: Secondary | ICD-10-CM | POA: Diagnosis not present

## 2018-04-11 DIAGNOSIS — Z79899 Other long term (current) drug therapy: Secondary | ICD-10-CM | POA: Diagnosis not present

## 2018-04-11 DIAGNOSIS — R739 Hyperglycemia, unspecified: Secondary | ICD-10-CM | POA: Insufficient documentation

## 2018-04-11 DIAGNOSIS — Z7982 Long term (current) use of aspirin: Secondary | ICD-10-CM | POA: Diagnosis not present

## 2018-04-11 DIAGNOSIS — Z91040 Latex allergy status: Secondary | ICD-10-CM | POA: Diagnosis not present

## 2018-04-11 LAB — CBC
HCT: 42.3 % (ref 36.0–46.0)
Hemoglobin: 13.5 g/dL (ref 12.0–15.0)
MCH: 28.4 pg (ref 26.0–34.0)
MCHC: 31.9 g/dL (ref 30.0–36.0)
MCV: 89.1 fL (ref 80.0–100.0)
Platelets: 290 10*3/uL (ref 150–400)
RBC: 4.75 MIL/uL (ref 3.87–5.11)
RDW: 14.4 % (ref 11.5–15.5)
WBC: 10.1 10*3/uL (ref 4.0–10.5)
nRBC: 0 % (ref 0.0–0.2)

## 2018-04-11 LAB — URINALYSIS, ROUTINE W REFLEX MICROSCOPIC
Bilirubin Urine: NEGATIVE
Glucose, UA: >=500 mg/dL — AB
Ketones, ur: NEGATIVE mg/dL
Leukocytes, UA: NEGATIVE
Nitrite: NEGATIVE
PROTEIN: NEGATIVE mg/dL
Specific Gravity, Urine: 1.033 — ABNORMAL HIGH (ref 1.005–1.030)
pH: 6 (ref 5.0–8.0)

## 2018-04-11 LAB — HEPATIC FUNCTION PANEL
ALT: 28 U/L (ref 0–44)
AST: 30 U/L (ref 15–41)
Albumin: 3.5 g/dL (ref 3.5–5.0)
Alkaline Phosphatase: 61 U/L (ref 38–126)
Bilirubin, Direct: 0.2 mg/dL (ref 0.0–0.2)
Indirect Bilirubin: 0.3 mg/dL (ref 0.3–0.9)
Total Bilirubin: 0.5 mg/dL (ref 0.3–1.2)
Total Protein: 6.8 g/dL (ref 6.5–8.1)

## 2018-04-11 LAB — BASIC METABOLIC PANEL
Anion gap: 10 (ref 5–15)
BUN: 7 mg/dL — ABNORMAL LOW (ref 8–23)
CO2: 23 mmol/L (ref 22–32)
Calcium: 9.1 mg/dL (ref 8.9–10.3)
Chloride: 100 mmol/L (ref 98–111)
Creatinine, Ser: 0.53 mg/dL (ref 0.44–1.00)
GFR calc Af Amer: >60 mL/min (ref >60)
GFR calc non Af Amer: >60 mL/min (ref >60)
Glucose, Bld: 408 mg/dL — ABNORMAL HIGH (ref 70–99)
Potassium: 2.8 mmol/L — ABNORMAL LOW (ref 3.5–5.1)
Sodium: 133 mmol/L — ABNORMAL LOW (ref 135–145)

## 2018-04-11 LAB — DIFFERENTIAL
Basophils Absolute: 0.1 10*3/uL (ref 0.0–0.1)
Basophils Relative: 1 %
EOS ABS: 0.2 10*3/uL (ref 0.0–0.5)
Eosinophils Relative: 2 %
Lymphocytes Relative: 28 %
Lymphs Abs: 2.8 10*3/uL (ref 0.7–4.0)
Monocytes Absolute: 1.2 10*3/uL — ABNORMAL HIGH (ref 0.1–1.0)
Monocytes Relative: 12 %
Neutro Abs: 5.9 10*3/uL (ref 1.7–7.7)
Neutrophils Relative %: 58 %

## 2018-04-11 LAB — TYPE AND SCREEN
ABO/RH(D): A POS
Antibody Screen: NEGATIVE

## 2018-04-11 LAB — CBG MONITORING, ED
Glucose-Capillary: 403 mg/dL — ABNORMAL HIGH (ref 70–99)
Glucose-Capillary: 281 mg/dL — ABNORMAL HIGH (ref 70–99)

## 2018-04-11 MED ORDER — CLORAZEPATE DIPOTASSIUM 7.5 MG PO TABS: 7.5000 mg | ORAL_TABLET | ORAL | Status: DC

## 2018-04-11 MED ORDER — POTASSIUM CHLORIDE 10 MEQ/100ML IV SOLN
10.0000 meq | Freq: Once | INTRAVENOUS | Status: AC
  Administered 2018-04-11: 10 meq via INTRAVENOUS
  Filled 2018-04-11: qty 100

## 2018-04-11 MED ORDER — SODIUM CHLORIDE 0.9 % IV BOLUS
1000.0000 mL | Freq: Once | INTRAVENOUS | Status: AC
  Administered 2018-04-11: 1000 mL via INTRAVENOUS

## 2018-04-11 MED ORDER — HYDROCODONE-ACETAMINOPHEN 5-325 MG PO TABS
2.0000 | ORAL_TABLET | Freq: Once | ORAL | Status: AC
  Administered 2018-04-11: 2 via ORAL
  Filled 2018-04-11: qty 2

## 2018-04-11 MED ORDER — PANTOPRAZOLE SODIUM 40 MG IV SOLR
40.0000 mg | Freq: Once | INTRAVENOUS | Status: AC
  Administered 2018-04-11: 40 mg via INTRAVENOUS
  Filled 2018-04-11: qty 40

## 2018-04-11 MED ORDER — POTASSIUM CHLORIDE CRYS ER 20 MEQ PO TBCR
40.0000 meq | EXTENDED_RELEASE_TABLET | Freq: Once | ORAL | Status: AC
  Administered 2018-04-11: 40 meq via ORAL
  Filled 2018-04-11: qty 2

## 2018-04-11 NOTE — ED Provider Notes (Signed)
[facility] EMERGENCY DEPARTMENT Provider Note   CSN: [id number] Arrival date & time: 04/11/18  1748     History   Chief Complaint Chief Complaint  Patient presents with  . Hyperglycemia    HPI [name] is a 68 y.o. female.  Patient complains of diarrhea with blood in it and weakness and she believes her sugars patient does not have a history of diabetes  The history is provided by the patient. No language interpreter was used.  Weakness  Severity:  Moderate Onset quality:  Sudden Timing:  Constant Progression:  Worsening Chronicity:  New Context: not alcohol use   Relieved by:  Nothing Worsened by:  Nothing Ineffective treatments:  None tried Associated symptoms: diarrhea   Associated symptoms: no abdominal pain, no chest pain, no cough, no frequency, no headaches and no seizures     Past Medical History:  Diagnosis Date  . Anxiety   . Arthritis    "hands, arms, back, neck, knees, fingers" (01/21/2016)  . Atrial fibrillation (HCC)    ASA daiy; came off Coumadin shortly after stroke  . Breast cancer, left breast ([place]) 1970s; 2015  . Cancer of skin of leg    BLE  . 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HCC)    left  . Chronic lower back pain   . Chronic pain   . COPD (chronic obstructive pulmonary disease) ([place])   . Coronary artery disease   . CVA (cerebral vascular accident) [facility]) pt denies right brain CVA   Right brain secondary to right internal carotid artery dissection for which 2 stents were placed;F/u cerebral angiography in 05/2008-minimal plaque; healing of previously identified left internal carotid artery dissection  . Depression   . GERD (gastroesophageal reflux disease)   . Heart murmur   . Hepatic cirrhosis ([place]) 12/20/2017  . Hepatitis   . History of blood transfusion    "related to spleen"  . History of hiatal hernia   . History of kidney stones   . Hyperlipidemia   . Hypertension   . Hypothyroidism   . Kidney stone   . Melanoma of forearm,  left ([place])   . Migraine    "a few migraines/year" (01/21/2016)  . NSTEMI (non-ST elevated myocardial infarction) ([place]) 01/21/2016   Archie Endo 01/21/2016  . Obesity   . Stroke [facility]) 04/2003   2 carotid artery stents in place; small left parietal and left hemispheric CVA.Phillis Knack 01/21/2016  . Tobacco use disorder    50 pack years; questionably discontinued in 2010  . Wears glasses     Patient Active Problem List   Diagnosis Date Noted  . Hepatic cirrhosis ([place]) 12/20/2017  . Bradycardia 08/21/2016  . NSTEMI (non-ST elevation myocardial infarction) ([place]) 01/21/2016  . Chronic diastolic CHF (congestive heart failure) ([place]) 01/01/2016  . Symptomatic bradycardia 09/30/2015  . CVA (cerebral infarction) 09/30/2015  . 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 Essential hypertension 09/30/2015  . Chronic pain 09/30/2015  . Hyperlipidemia 09/30/2015  . Surgical menopause on hormone replacement therapy 09/30/2015  . GERD (gastroesophageal reflux disease) 09/30/2015  . Paroxysmal atrial fibrillation ([place]) 09/30/2015  . Hypothyroidism 09/30/2015  . Complete heart block ([place]) 09/30/2015  . Breast mass 04/27/2013  . Deltoid tendinitis of left shoulder 11/23/2012  . Rotator cuff tear 10/06/2012  . Rotator cuff syndrome of left shoulder 10/06/2012  . Pain in joint, shoulder region 10/06/2012  . Tendonitis, calcific, shoulder 07/26/2012  . CVA (cerebral infarction)   . COPD (chronic obstructive pulmonary disease) ([place])   . Hypertension   .  Hyperlipidemia   . Obesity   . Chest tightness   . Tobacco use disorder     Past Surgical History:  Procedure Laterality Date  . ABDOMINAL HYSTERECTOMY  1978  . APPENDECTOMY    . BREAST BIOPSY Left 1970s; 2015  . BREAST LUMPECTOMY Left 1970s  . BREAST LUMPECTOMY WITH NEEDLE LOCALIZATION Left 07/07/2013   Procedure: BREAST LUMPECTOMY WITH NEEDLE LOCALIZATION;  Surgeon: Merrie Roof, MD;  Location: [place];  Service: General;  Laterality: Left;  .  CARDIAC CATHETERIZATION N/A 01/22/2016   Procedure: Left Heart Cath and Coronary Angiography;  Surgeon: Peter M Martinique, MD;  Location: [place] CV LAB;  Service: Cardiovascular;  Laterality: N/A;  . CARDIAC CATHETERIZATION N/A 01/22/2016   Procedure: Coronary Stent Intervention;  Surgeon: Peter M Martinique, MD;  Location: [place] CV LAB;  Service: Cardiovascular;  Laterality: N/A;  . CAROTID STENT Left 04/2003   small left parietal and left hemispheric CVA/notes 07/29/2010  . CHOLECYSTECTOMY OPEN    . COLONOSCOPY  2011  . ESOPHAGOGASTRODUODENOSCOPY  08/06/2011   Procedure: ESOPHAGOGASTRODUODENOSCOPY (EGD);  Surgeon: Rogene Houston, MD;  [facility];  Service: Endoscopy;  Laterality: N/A;  . FLEXIBLE SIGMOIDOSCOPY  08/06/2011   Procedure: FLEXIBLE SIGMOIDOSCOPY;  Surgeon: Rogene Houston, MD;  [facility];  Service: Endoscopy;  Laterality: N/A;  . INGUINAL HERNIA REPAIR Left 1970s  . LARYNX SURGERY  1970s   Polyps excised  . MELANOMA EXCISION Left ~ 2016   forearm  . 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   spontaneous rupture     OB History    Gravida  2   Para  2   Term  2   Preterm  0   AB  0   Living  2     SAB  0   TAB  0   Ectopic  0   Multiple      Live Births               Home Medications    Prior to Admission medications   Medication Sig Start Date End Date Taking? Authorizing Provider  albuterol (PROVENTIL HFA;VENTOLIN HFA) 108 (90 Base) MCG/ACT inhaler Inhale 2 puffs into the lungs every 6 (six) hours as needed for wheezing or shortness of breath.   Yes [provider]  albuterol (PROVENTIL) (2.5 MG/3ML) 0.083% nebulizer solution Take 2.5 mg by nebulization every 6 (six) hours as needed for wheezing or shortness of breath.   Yes [provider]  amLODipine (NORVASC) 10 MG tablet TAKE ONE TABLET BY MOUTH ONCE DAILY. Patient taking differently: Take 10 mg by mouth daily.  10/15/17  Yes BranchAlphonse Guild, MD  aspirin EC  81 MG tablet Take 1 tablet (81 mg total) by mouth daily. 01/24/16  Yes Arbutus Leas, NP  atorvastatin (LIPITOR) 80 MG tablet TAKE ONE TABLET BY MOUTH DAILY AT 6 P.M. Patient taking differently: Take 80 mg by mouth every evening.  03/01/18  Yes BranchAlphonse Guild, MD  azaTHIOprine (IMURAN) 50 MG tablet Take 1.5 tablets (75 mg total) by mouth daily. 02/24/18  Yes Setzer, Rona Ravens, NP  carvedilol (COREG) 3.125 MG tablet Take 1 tablet (3.125 mg total) by mouth 2 (two) times daily with a meal. 06/18/17  Yes Branch, Alphonse Guild, MD  cetirizine (ZYRTEC) 10 MG tablet Take 10 mg by mouth daily.   Yes [provider]  clorazepate (TRANXENE) 7.5 MG tablet Take 7.5 mg by mouth 3 (three)  times daily.  01/03/16  Yes [provider]  famotidine (PEPCID) 20 MG tablet Take 20 mg by mouth at bedtime. 03/28/18  Yes [provider]  furosemide (LASIX) 40 MG tablet Take 40 mg by mouth daily as needed for fluid.    Yes [provider]  HYDROcodone-acetaminophen (NORCO) 10-325 MG tablet Take 1 tablet by mouth 3 (three) times daily as needed for moderate pain.    Yes [provider]  isosorbide mononitrate (ISMO,MONOKET) 20 MG tablet Take 20 mg by mouth 2 (two) times daily at 10 AM and 5 PM.   Yes [provider]  levothyroxine (SYNTHROID, LEVOTHROID) 50 MCG tablet Take 50 mcg by mouth daily before breakfast.  07/20/14  Yes [provider]  lisinopril (PRINIVIL,ZESTRIL) 20 MG tablet Take 1 tablet (20 mg total) by mouth daily. 05/04/17 04/11/18 Yes BranchAlphonse Guild, MD  pantoprazole (PROTONIX) 40 MG tablet Take 40 mg by mouth every morning.  09/13/15  Yes [provider]  senna (SENOKOT) 8.6 MG tablet Take 1 tablet by mouth at bedtime.    Yes [provider]  SPIRIVA RESPIMAT 2.5 MCG/ACT AERS Inhale 1 puff into the lungs daily.  04/01/18  Yes [provider]  ticagrelor (BRILINTA) 90 MG TABS tablet Take 1 tablet (90 mg total) by mouth 2 (two)  times daily. 02/14/18  Yes BranchAlphonse Guild, MD  magic mouthwash w/lidocaine SOLN Take 5 mLs by mouth 3 (three) times daily as needed for mouth pain. Swish and spit, do not swallow Patient not taking: Reported on 04/11/2018 03/05/18   Triplett, Tammy, PA-C  nitroGLYCERIN (NITROSTAT) 0.4 MG SL tablet Place 0.4 mg under the tongue every 5 (five) minutes as needed for chest pain.     [provider]  potassium chloride SA (K-DUR,KLOR-CON) 20 MEQ tablet Take 20 mEq by mouth daily as needed (when Lasix 40mg  is taken).  08/19/15   [provider]    Family History Family History  Problem Relation Age of Onset  . Heart failure Mother   . Cancer Father   . Heart failure Father   . Cancer Sister   . Dementia Sister   . Heart disease Other   . Arthritis Other   . Cancer Other   . Diabetes Other   . Kidney disease Other   . Cancer Sister   . Heart failure Brother     Social History Social History   Tobacco Use  . Smoking status: Former Smoker    Packs/day: 1.00    Years: 40.00    Pack years: 40.00    Types: Cigarettes    Last attempt to quit: 07/06/2007    Years since quitting: 10.7  . Smokeless tobacco: Never Used  Substance Use Topics  . Alcohol use: No    Alcohol/week: 0.0 standard drinks  . Drug use: No     Allergies   Tape and Latex   Review of Systems Review of Systems  Constitutional: Negative for appetite change and fatigue.  HENT: Negative for congestion, ear discharge and sinus pressure.   Eyes: Negative for discharge.  Respiratory: Negative for cough.   Cardiovascular: Negative for chest pain.  Gastrointestinal: Positive for diarrhea. Negative for abdominal pain.  Genitourinary: Negative for frequency and hematuria.  Musculoskeletal: Negative for back pain.  Skin: Negative for rash.  Neurological: Positive for weakness. Negative for seizures and headaches.  Psychiatric/Behavioral: Negative for hallucinations.     Physical Exam Updated  Vital Signs BP (!) 143/78  Pulse 71   Temp 98 F (36.7 C) (Oral)   Resp 16   Ht 5\' 3"  (1.6 m)   Wt 90.7 kg   SpO2 95%   BMI 35.43 kg/m   Physical Exam Vitals signs and nursing note reviewed.  Constitutional:      Appearance: She is well-developed.  HENT:     Head: Normocephalic.     Comments: Mucous membranes    Nose: Nose normal.  Eyes:     General: No scleral icterus.    Conjunctiva/sclera: Conjunctivae normal.  Neck:     Musculoskeletal: Neck supple.     Thyroid: No thyromegaly.  Cardiovascular:     Rate and Rhythm: Regular rhythm. Bradycardia present.     Heart sounds: No murmur. No friction rub. No gallop.   Pulmonary:     Breath sounds: No stridor. No wheezing or rales.  Chest:     Chest wall: No tenderness.  Abdominal:     General: There is no distension.     Tenderness: There is no abdominal tenderness. There is no rebound.  Musculoskeletal: Normal range of motion.  Lymphadenopathy:     Cervical: No cervical adenopathy.  Skin:    Findings: No erythema or rash.  Neurological:     Mental Status: She is oriented to person, place, and time.     Motor: No abnormal muscle tone.     Coordination: Coordination normal.  Psychiatric:        Behavior: Behavior normal.      ED Treatments / Results  Labs (all labs ordered are listed, but only abnormal results are displayed) Labs Reviewed  BASIC METABOLIC PANEL - Abnormal; Notable for the following components:      Result Value   Sodium 133 (*)    Potassium 2.8 (*)    Glucose, Bld 408 (*)    BUN 7 (*)    All other components within normal limits  URINALYSIS, ROUTINE W REFLEX MICROSCOPIC - Abnormal; Notable for the following components:   APPearance HAZY (*)    Specific Gravity, Urine 1.033 (*)    Glucose, UA >=500 (*)    Hgb urine dipstick SMALL (*)    Bacteria, UA RARE (*)    All other components within normal limits  DIFFERENTIAL - Abnormal; Notable for the following components:   Monocytes Absolute  1.2 (*)    All other components within normal limits  CBG MONITORING, ED - Abnormal; Notable for the following components:   Glucose-Capillary 403 (*)    All other components within normal limits  CBG MONITORING, ED - Abnormal; Notable for the following components:   Glucose-Capillary 281 (*)    All other components within normal limits  CBC  HEPATIC FUNCTION PANEL  CBG MONITORING, ED  TYPE AND SCREEN    EKG None  Radiology No results found.  Procedures Procedures (including critical care time)  Medications Ordered in ED Medications  clorazepate (TRANXENE) tablet 7.5 mg (has no administration in time range)  sodium chloride 0.9 % bolus 1,000 mL (0 mLs Intravenous Stopped 04/11/18 1939)  pantoprazole (PROTONIX) injection 40 mg (40 mg Intravenous Given 04/11/18 1841)  potassium chloride SA (K-DUR,KLOR-CON) CR tablet 40 mEq (40 mEq Oral Given 04/11/18 2001)  potassium chloride 10 mEq in 100 mL IVPB (0 mEq Intravenous Stopped 04/11/18 2057)  HYDROcodone-acetaminophen (NORCO/VICODIN) 5-325 MG per tablet 2 tablet (2 tablets Oral Given 04/11/18 2037)     Initial Impression / Assessment and Plan / ED Course  I have reviewed the  triage vital signs and the nursing notes.  Pertinent labs & imaging results that were available during my care of the patient were reviewed by me and considered in my medical decision making (see chart for details). CRITICAL CARE Performed by: Milton Ferguson Total critical care time: 35 minutes Critical care time was exclusive of separately billable procedures and treating other patients. Critical care was necessary to treat or prevent imminent or life-threatening deterioration. Critical care was time spent personally by me on the following activities: development of treatment plan with patient and/or surrogate as well as nursing, discussions with consultants, evaluation of patient's response to treatment, examination of patient, obtaining history from patient or  surrogate, ordering and performing treatments and interventions, ordering and review of laboratory studies, ordering and review of radiographic studies, pulse oximetry and re-evaluation of patient's condition.     Patient with hypokalemia elevated sugar.  I suggested we should admit her to the hospital to control her new diabetes and hypokalemia.  Patient left AMA  Final Clinical Impressions(s) / ED Diagnoses   Final diagnoses:  Hyperglycemia    ED Discharge Orders    None       Milton Ferguson, MD 04/11/18 2105

## 2018-04-11 NOTE — Discharge Instructions (Signed)
Start taking your potassium pills.  Drink plenty of fluids and see your doctor tomorrow

## 2018-04-11 NOTE — ED Triage Notes (Signed)
Patient states she has ulcers, rectal bleeding, and elevated blood sugars even though she is not a diabetic. Bleeding started Friday night. Patient reports a large amount of blood. C/O weakness and SOB with coughing.

## 2018-04-18 ENCOUNTER — Ambulatory Visit: Payer: Medicare Other | Admitting: Cardiology

## 2018-04-19 ENCOUNTER — Inpatient Hospital Stay (HOSPITAL_COMMUNITY)
Admission: EM | Admit: 2018-04-19 | Discharge: 2018-04-20 | DRG: 191 | Disposition: A | Discharge status: Home / Self Care | Payer: Medicare Other | Attending: Internal Medicine | Admitting: Internal Medicine

## 2018-04-19 ENCOUNTER — Encounter (HOSPITAL_COMMUNITY): Payer: Self-pay | Admitting: Emergency Medicine

## 2018-04-19 ENCOUNTER — Other Ambulatory Visit: Payer: Self-pay

## 2018-04-19 ENCOUNTER — Emergency Department (HOSPITAL_COMMUNITY): Payer: Medicare Other

## 2018-04-19 DIAGNOSIS — J449 Chronic obstructive pulmonary disease, unspecified: Secondary | ICD-10-CM

## 2018-04-19 DIAGNOSIS — Z7901 Long term (current) use of anticoagulants: Secondary | ICD-10-CM

## 2018-04-19 DIAGNOSIS — I442 Atrioventricular block, complete: Secondary | ICD-10-CM | POA: Diagnosis present

## 2018-04-19 DIAGNOSIS — G43909 Migraine, unspecified, not intractable, without status migrainosus: Secondary | ICD-10-CM | POA: Diagnosis present

## 2018-04-19 DIAGNOSIS — I480 Paroxysmal atrial fibrillation: Secondary | ICD-10-CM | POA: Diagnosis present

## 2018-04-19 DIAGNOSIS — Z87442 Personal history of urinary calculi: Secondary | ICD-10-CM

## 2018-04-19 DIAGNOSIS — G894 Chronic pain syndrome: Secondary | ICD-10-CM | POA: Diagnosis not present

## 2018-04-19 DIAGNOSIS — I110 Hypertensive heart disease with heart failure: Secondary | ICD-10-CM | POA: Diagnosis present

## 2018-04-19 DIAGNOSIS — G8929 Other chronic pain: Secondary | ICD-10-CM | POA: Diagnosis present

## 2018-04-19 DIAGNOSIS — I5032 Chronic diastolic (congestive) heart failure: Secondary | ICD-10-CM | POA: Diagnosis not present

## 2018-04-19 DIAGNOSIS — Z8673 Personal history of transient ischemic attack (TIA), and cerebral infarction without residual deficits: Secondary | ICD-10-CM

## 2018-04-19 DIAGNOSIS — K449 Diaphragmatic hernia without obstruction or gangrene: Secondary | ICD-10-CM | POA: Diagnosis present

## 2018-04-19 DIAGNOSIS — E039 Hypothyroidism, unspecified: Secondary | ICD-10-CM | POA: Diagnosis present

## 2018-04-19 DIAGNOSIS — Z6835 Body mass index (BMI) 35.0-35.9, adult: Secondary | ICD-10-CM | POA: Diagnosis not present

## 2018-04-19 DIAGNOSIS — K219 Gastro-esophageal reflux disease without esophagitis: Secondary | ICD-10-CM | POA: Diagnosis present

## 2018-04-19 DIAGNOSIS — Z809 Family history of malignant neoplasm, unspecified: Secondary | ICD-10-CM

## 2018-04-19 DIAGNOSIS — Z9049 Acquired absence of other specified parts of digestive tract: Secondary | ICD-10-CM | POA: Diagnosis not present

## 2018-04-19 DIAGNOSIS — M545 Low back pain: Secondary | ICD-10-CM | POA: Diagnosis present

## 2018-04-19 DIAGNOSIS — Z90710 Acquired absence of both cervix and uterus: Secondary | ICD-10-CM

## 2018-04-19 DIAGNOSIS — R011 Cardiac murmur, unspecified: Secondary | ICD-10-CM | POA: Diagnosis present

## 2018-04-19 DIAGNOSIS — E669 Obesity, unspecified: Secondary | ICD-10-CM | POA: Diagnosis not present

## 2018-04-19 DIAGNOSIS — E1165 Type 2 diabetes mellitus with hyperglycemia: Secondary | ICD-10-CM | POA: Diagnosis not present

## 2018-04-19 DIAGNOSIS — K7460 Unspecified cirrhosis of liver: Secondary | ICD-10-CM | POA: Diagnosis present

## 2018-04-19 DIAGNOSIS — Z853 Personal history of malignant neoplasm of breast: Secondary | ICD-10-CM | POA: Diagnosis not present

## 2018-04-19 DIAGNOSIS — Z85820 Personal history of malignant melanoma of skin: Secondary | ICD-10-CM | POA: Diagnosis not present

## 2018-04-19 DIAGNOSIS — I2510 Atherosclerotic heart disease of native coronary artery without angina pectoris: Secondary | ICD-10-CM | POA: Diagnosis present

## 2018-04-19 DIAGNOSIS — Z79890 Hormone replacement therapy (postmenopausal): Secondary | ICD-10-CM

## 2018-04-19 DIAGNOSIS — M159 Polyosteoarthritis, unspecified: Secondary | ICD-10-CM | POA: Diagnosis present

## 2018-04-19 DIAGNOSIS — Z9081 Acquired absence of spleen: Secondary | ICD-10-CM | POA: Diagnosis not present

## 2018-04-19 DIAGNOSIS — Z91040 Latex allergy status: Secondary | ICD-10-CM

## 2018-04-19 DIAGNOSIS — Z955 Presence of coronary angioplasty implant and graft: Secondary | ICD-10-CM

## 2018-04-19 DIAGNOSIS — J441 Chronic obstructive pulmonary disease with (acute) exacerbation: Secondary | ICD-10-CM | POA: Diagnosis not present

## 2018-04-19 DIAGNOSIS — E785 Hyperlipidemia, unspecified: Secondary | ICD-10-CM | POA: Diagnosis not present

## 2018-04-19 DIAGNOSIS — I10 Essential (primary) hypertension: Secondary | ICD-10-CM | POA: Diagnosis present

## 2018-04-19 DIAGNOSIS — T380X5A Adverse effect of glucocorticoids and synthetic analogues, initial encounter: Secondary | ICD-10-CM | POA: Diagnosis present

## 2018-04-19 DIAGNOSIS — Z7982 Long term (current) use of aspirin: Secondary | ICD-10-CM

## 2018-04-19 DIAGNOSIS — Z8249 Family history of ischemic heart disease and other diseases of the circulatory system: Secondary | ICD-10-CM

## 2018-04-19 DIAGNOSIS — I252 Old myocardial infarction: Secondary | ICD-10-CM

## 2018-04-19 DIAGNOSIS — Z6834 Body mass index (BMI) 34.0-34.9, adult: Secondary | ICD-10-CM

## 2018-04-19 DIAGNOSIS — Z87891 Personal history of nicotine dependence: Secondary | ICD-10-CM

## 2018-04-19 DIAGNOSIS — Z91048 Other nonmedicinal substance allergy status: Secondary | ICD-10-CM

## 2018-04-19 LAB — CBC
HCT: 42 % (ref 36.0–46.0)
Hemoglobin: 13.6 g/dL (ref 12.0–15.0)
MCH: 29.1 pg (ref 26.0–34.0)
MCHC: 32.4 g/dL (ref 30.0–36.0)
MCV: 89.7 fL (ref 80.0–100.0)
PLATELETS: 303 10*3/uL (ref 150–400)
RBC: 4.68 MIL/uL (ref 3.87–5.11)
RDW: 14.6 % (ref 11.5–15.5)
WBC: 7.4 10*3/uL (ref 4.0–10.5)
nRBC: 0 % (ref 0.0–0.2)

## 2018-04-19 LAB — CBC WITH DIFFERENTIAL/PLATELET
Abs Immature Granulocytes: 0.02 10*3/uL (ref 0.00–0.07)
BASOS PCT: 1 %
Basophils Absolute: 0.1 10*3/uL (ref 0.0–0.1)
Eosinophils Absolute: 0.1 10*3/uL (ref 0.0–0.5)
Eosinophils Relative: 1 %
HCT: 44 % (ref 36.0–46.0)
Hemoglobin: 14 g/dL (ref 12.0–15.0)
IMMATURE GRANULOCYTES: 0 %
Lymphocytes Relative: 40 %
Lymphs Abs: 3.1 10*3/uL (ref 0.7–4.0)
MCH: 28.2 pg (ref 26.0–34.0)
MCHC: 31.8 g/dL (ref 30.0–36.0)
MCV: 88.7 fL (ref 80.0–100.0)
Monocytes Absolute: 0.9 10*3/uL (ref 0.1–1.0)
Monocytes Relative: 12 %
NEUTROS ABS: 3.5 10*3/uL (ref 1.7–7.7)
Neutrophils Relative %: 46 %
Platelets: 295 10*3/uL (ref 150–400)
RBC: 4.96 MIL/uL (ref 3.87–5.11)
RDW: 14.6 % (ref 11.5–15.5)
WBC Morphology: ABNORMAL
WBC: 7.6 10*3/uL (ref 4.0–10.5)
nRBC: 0 % (ref 0.0–0.2)

## 2018-04-19 LAB — COMPREHENSIVE METABOLIC PANEL
ALT: 31 U/L (ref 0–44)
AST: 37 U/L (ref 15–41)
Albumin: 3.5 g/dL (ref 3.5–5.0)
Alkaline Phosphatase: 58 U/L (ref 38–126)
Anion gap: 10 (ref 5–15)
BUN: 5 mg/dL — ABNORMAL LOW (ref 8–23)
CALCIUM: 8.7 mg/dL — AB (ref 8.9–10.3)
CO2: 23 mmol/L (ref 22–32)
Chloride: 102 mmol/L (ref 98–111)
Creatinine, Ser: 0.55 mg/dL (ref 0.44–1.00)
GFR calc non Af Amer: >60 mL/min (ref >60)
Glucose, Bld: 285 mg/dL — ABNORMAL HIGH (ref 70–99)
Potassium: 3.5 mmol/L (ref 3.5–5.1)
Sodium: 135 mmol/L (ref 135–145)
Total Bilirubin: 0.4 mg/dL (ref 0.3–1.2)
Total Protein: 7.1 g/dL (ref 6.5–8.1)

## 2018-04-19 LAB — TROPONIN I

## 2018-04-19 LAB — CREATININE, SERUM
Creatinine, Ser: 0.54 mg/dL (ref 0.44–1.00)
GFR calc Af Amer: >60 mL/min (ref >60)
GFR calc non Af Amer: >60 mL/min (ref >60)

## 2018-04-19 LAB — HEMOGLOBIN A1C
Hgb A1c MFr Bld: 13 % — ABNORMAL HIGH (ref 4.8–5.6)
Mean Plasma Glucose: 326.4 mg/dL

## 2018-04-19 LAB — GLUCOSE, CAPILLARY
Glucose-Capillary: 335 mg/dL — ABNORMAL HIGH (ref 70–99)
Glucose-Capillary: 307 mg/dL — ABNORMAL HIGH (ref 70–99)
Glucose-Capillary: 215 mg/dL — ABNORMAL HIGH (ref 70–99)

## 2018-04-19 LAB — BRAIN NATRIURETIC PEPTIDE: B Natriuretic Peptide: 35 pg/mL (ref 0.0–100.0)

## 2018-04-19 MED ORDER — IPRATROPIUM-ALBUTEROL 0.5-2.5 (3) MG/3ML IN SOLN
3.0000 mL | Freq: Once | RESPIRATORY_TRACT | Status: AC
  Administered 2018-04-19: 3 mL via RESPIRATORY_TRACT
  Filled 2018-04-19: qty 3

## 2018-04-19 MED ORDER — SENNA 8.6 MG PO TABS
1.0000 | ORAL_TABLET | Freq: Every day | ORAL | Status: DC
  Administered 2018-04-19: 8.6 mg via ORAL
  Filled 2018-04-19: qty 1

## 2018-04-19 MED ORDER — AZATHIOPRINE 50 MG PO TABS
75.0000 mg | ORAL_TABLET | Freq: Every day | ORAL | Status: DC
  Administered 2018-04-19 – 2018-04-20 (×2): 75 mg via ORAL
  Filled 2018-04-19 (×4): qty 2

## 2018-04-19 MED ORDER — PREDNISONE 20 MG PO TABS
30.0000 mg | ORAL_TABLET | Freq: Every day | ORAL | Status: DC
  Administered 2018-04-20: 30 mg via ORAL
  Filled 2018-04-19: qty 1

## 2018-04-19 MED ORDER — INSULIN ASPART 100 UNIT/ML ~~LOC~~ SOLN
0.0000 [IU] | Freq: Every day | SUBCUTANEOUS | Status: DC
  Administered 2018-04-19: 2 [IU] via SUBCUTANEOUS

## 2018-04-19 MED ORDER — METHYLPREDNISOLONE SODIUM SUCC 40 MG IJ SOLR
60.0000 mg | Freq: Once | INTRAMUSCULAR | Status: AC
  Administered 2018-04-19: 60 mg via INTRAVENOUS
  Filled 2018-04-19: qty 2

## 2018-04-19 MED ORDER — FAMOTIDINE 20 MG PO TABS
20.0000 mg | ORAL_TABLET | Freq: Two times a day (BID) | ORAL | Status: DC
  Administered 2018-04-19 – 2018-04-20 (×3): 20 mg via ORAL
  Filled 2018-04-19 (×3): qty 1

## 2018-04-19 MED ORDER — ALBUTEROL SULFATE (2.5 MG/3ML) 0.083% IN NEBU
5.0000 mg | INHALATION_SOLUTION | Freq: Once | RESPIRATORY_TRACT | Status: AC
  Administered 2018-04-19: 5 mg via RESPIRATORY_TRACT
  Filled 2018-04-19: qty 6

## 2018-04-19 MED ORDER — TICAGRELOR 90 MG PO TABS
90.0000 mg | ORAL_TABLET | Freq: Two times a day (BID) | ORAL | Status: DC
  Administered 2018-04-19 – 2018-04-20 (×3): 90 mg via ORAL
  Filled 2018-04-19 (×3): qty 1

## 2018-04-19 MED ORDER — LEVOTHYROXINE SODIUM 50 MCG PO TABS
50.0000 ug | ORAL_TABLET | Freq: Every day | ORAL | Status: DC
  Administered 2018-04-20: 50 ug via ORAL
  Filled 2018-04-19: qty 1

## 2018-04-19 MED ORDER — ONDANSETRON HCL 4 MG/2ML IJ SOLN: 4.0000 mg | Freq: Four times a day (QID) | INTRAMUSCULAR | Status: DC | PRN

## 2018-04-19 MED ORDER — DOCUSATE SODIUM 100 MG PO CAPS
100.0000 mg | ORAL_CAPSULE | Freq: Two times a day (BID) | ORAL | Status: DC
  Administered 2018-04-19 – 2018-04-20 (×3): 100 mg via ORAL
  Filled 2018-04-19 (×3): qty 1

## 2018-04-19 MED ORDER — HYDROCODONE-ACETAMINOPHEN 10-325 MG PO TABS
1.0000 | ORAL_TABLET | Freq: Three times a day (TID) | ORAL | Status: DC | PRN
  Administered 2018-04-19 – 2018-04-20 (×4): 1 via ORAL
  Filled 2018-04-19 (×4): qty 1

## 2018-04-19 MED ORDER — ASPIRIN EC 81 MG PO TBEC
81.0000 mg | DELAYED_RELEASE_TABLET | Freq: Every day | ORAL | Status: DC
  Administered 2018-04-19 – 2018-04-20 (×2): 81 mg via ORAL
  Filled 2018-04-19 (×2): qty 1

## 2018-04-19 MED ORDER — SODIUM CHLORIDE 0.9 % IV SOLN
INTRAVENOUS | Status: DC
  Administered 2018-04-19 (×2): via INTRAVENOUS

## 2018-04-19 MED ORDER — NITROGLYCERIN 0.4 MG SL SUBL: 0.4000 mg | SUBLINGUAL_TABLET | SUBLINGUAL | Status: DC | PRN

## 2018-04-19 MED ORDER — ATORVASTATIN CALCIUM 40 MG PO TABS
80.0000 mg | ORAL_TABLET | Freq: Every evening | ORAL | Status: DC
  Administered 2018-04-19: 80 mg via ORAL
  Filled 2018-04-19: qty 2

## 2018-04-19 MED ORDER — ACETAMINOPHEN 650 MG RE SUPP: 650.0000 mg | Freq: Four times a day (QID) | RECTAL | Status: DC | PRN

## 2018-04-19 MED ORDER — INSULIN ASPART 100 UNIT/ML ~~LOC~~ SOLN
6.0000 [IU] | Freq: Once | SUBCUTANEOUS | Status: AC
  Administered 2018-04-19: 6 [IU] via INTRAVENOUS
  Filled 2018-04-19: qty 1

## 2018-04-19 MED ORDER — MORPHINE SULFATE (PF) 4 MG/ML IV SOLN
4.0000 mg | Freq: Once | INTRAVENOUS | Status: AC
  Administered 2018-04-19: 4 mg via INTRAVENOUS
  Filled 2018-04-19: qty 1

## 2018-04-19 MED ORDER — LISINOPRIL 10 MG PO TABS
20.0000 mg | ORAL_TABLET | Freq: Every day | ORAL | Status: DC
  Administered 2018-04-19 – 2018-04-20 (×2): 20 mg via ORAL
  Filled 2018-04-19 (×2): qty 2

## 2018-04-19 MED ORDER — SODIUM CHLORIDE 0.9 % IV BOLUS
1000.0000 mL | Freq: Once | INTRAVENOUS | Status: AC
  Administered 2018-04-19: 1000 mL via INTRAVENOUS

## 2018-04-19 MED ORDER — INSULIN ASPART 100 UNIT/ML ~~LOC~~ SOLN
0.0000 [IU] | Freq: Three times a day (TID) | SUBCUTANEOUS | Status: DC
  Administered 2018-04-19: 11 [IU] via SUBCUTANEOUS
  Administered 2018-04-19: 15 [IU] via SUBCUTANEOUS
  Administered 2018-04-20: 7 [IU] via SUBCUTANEOUS
  Administered 2018-04-20: 4 [IU] via SUBCUTANEOUS

## 2018-04-19 MED ORDER — ONDANSETRON HCL 4 MG PO TABS: 4.0000 mg | ORAL_TABLET | Freq: Four times a day (QID) | ORAL | Status: DC | PRN

## 2018-04-19 MED ORDER — CARVEDILOL 3.125 MG PO TABS
3.1250 mg | ORAL_TABLET | Freq: Two times a day (BID) | ORAL | Status: DC
  Administered 2018-04-19 – 2018-04-20 (×2): 3.125 mg via ORAL
  Filled 2018-04-19 (×2): qty 1

## 2018-04-19 MED ORDER — AMLODIPINE BESYLATE 5 MG PO TABS
10.0000 mg | ORAL_TABLET | Freq: Every day | ORAL | Status: DC
  Administered 2018-04-19 – 2018-04-20 (×2): 10 mg via ORAL
  Filled 2018-04-19 (×2): qty 2

## 2018-04-19 MED ORDER — ACETAMINOPHEN 325 MG PO TABS: 650.0000 mg | ORAL_TABLET | Freq: Four times a day (QID) | ORAL | Status: DC | PRN

## 2018-04-19 MED ORDER — PANTOPRAZOLE SODIUM 40 MG PO TBEC
40.0000 mg | DELAYED_RELEASE_TABLET | Freq: Every day | ORAL | Status: DC
  Administered 2018-04-19 – 2018-04-20 (×2): 40 mg via ORAL
  Filled 2018-04-19 (×2): qty 1

## 2018-04-19 MED ORDER — ISOSORBIDE MONONITRATE 20 MG PO TABS
20.0000 mg | ORAL_TABLET | Freq: Two times a day (BID) | ORAL | Status: DC
  Administered 2018-04-19 – 2018-04-20 (×3): 20 mg via ORAL
  Filled 2018-04-19 (×7): qty 1

## 2018-04-19 MED ORDER — INSULIN ASPART 100 UNIT/ML ~~LOC~~ SOLN
5.0000 [IU] | Freq: Three times a day (TID) | SUBCUTANEOUS | Status: DC
  Administered 2018-04-19 – 2018-04-20 (×4): 5 [IU] via SUBCUTANEOUS

## 2018-04-19 MED ORDER — CLORAZEPATE DIPOTASSIUM 7.5 MG PO TABS
7.5000 mg | ORAL_TABLET | Freq: Three times a day (TID) | ORAL | Status: DC
  Administered 2018-04-19 – 2018-04-20 (×4): 7.5 mg via ORAL
  Filled 2018-04-19 (×4): qty 1

## 2018-04-19 MED ORDER — IPRATROPIUM-ALBUTEROL 0.5-2.5 (3) MG/3ML IN SOLN
3.0000 mL | Freq: Three times a day (TID) | RESPIRATORY_TRACT | Status: DC
  Administered 2018-04-19 – 2018-04-20 (×3): 3 mL via RESPIRATORY_TRACT
  Filled 2018-04-19 (×4): qty 3

## 2018-04-19 MED ORDER — INSULIN GLARGINE 100 UNIT/ML ~~LOC~~ SOLN
15.0000 [IU] | Freq: Every day | SUBCUTANEOUS | Status: DC
  Administered 2018-04-19 – 2018-04-20 (×2): 15 [IU] via SUBCUTANEOUS
  Filled 2018-04-19 (×3): qty 0.15

## 2018-04-19 MED ORDER — DOXYCYCLINE HYCLATE 100 MG PO TABS: 100.0000 mg | ORAL_TABLET | Freq: Two times a day (BID) | ORAL | Status: DC

## 2018-04-19 MED ORDER — SODIUM CHLORIDE 0.9 % IV SOLN
INTRAVENOUS | Status: DC
  Administered 2018-04-19 (×2): via INTRAVENOUS

## 2018-04-19 MED ORDER — LORATADINE 10 MG PO TABS
10.0000 mg | ORAL_TABLET | Freq: Every day | ORAL | Status: DC
  Administered 2018-04-19 – 2018-04-20 (×2): 10 mg via ORAL
  Filled 2018-04-19 (×2): qty 1

## 2018-04-19 MED ORDER — HEPARIN SODIUM (PORCINE) 5000 UNIT/ML IJ SOLN
5000.0000 [IU] | Freq: Three times a day (TID) | INTRAMUSCULAR | Status: DC
  Administered 2018-04-19 – 2018-04-20 (×4): 5000 [IU] via SUBCUTANEOUS
  Filled 2018-04-19 (×4): qty 1

## 2018-04-19 NOTE — ED Triage Notes (Addendum)
Pt with Sob since 2300 last night. States worse with exertion. Pt's sats on room air at home were 93%. States productive cough with yellow sputum. Pt states she thinks she was running a fever at home because her face was "flushed" and she had "the chills". Pt states she was on ABX that she finished yesterday but does not know why she was taking them.

## 2018-04-19 NOTE — Plan of Care (Signed)

## 2018-04-19 NOTE — ED Provider Notes (Signed)
[facility] EMERGENCY DEPARTMENT Provider Note   CSN: [id number] Arrival date & time: 04/19/18  3888     History   Chief Complaint Chief Complaint  Patient presents with  . Shortness of Breath    HPI [name] is a 68 y.o. female.  HPI   68 year old female with cough and shortness of breath.  She reports intermittent symptoms since end of December.  She reports being on prednisone and antibiotics 2 different times, but not currently.  Since around 11:00 last night she has been feeling increasingly short of breath.  Cough.  No wheezing.  Cough is occasionally productive for yellow sputum.  Subjective fevers.  Chills.  Diffuse body aches.  Smoker.  History of COPD.  Not on chronic oxygen.  Past Medical History:  Diagnosis Date  . Anxiety   . Arthritis    "hands, arms, back, neck, knees, fingers" (01/21/2016)  . Atrial fibrillation (HCC)    ASA daiy; came off Coumadin shortly after stroke  . Breast cancer, left breast ([place]) 1970s; 2015  . Cancer of skin of leg    BLE  . 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HCC)    left  . Chronic lower back pain   . Chronic pain   . COPD (chronic obstructive pulmonary disease) ([place])   . Coronary artery disease   . CVA (cerebral vascular accident) [facility]) pt denies right brain CVA   Right brain secondary to right internal carotid artery dissection for which 2 stents were placed;F/u cerebral angiography in 05/2008-minimal plaque; healing of previously identified left internal carotid artery dissection  . Depression   . GERD (gastroesophageal reflux disease)   . Heart murmur   . Hepatic cirrhosis ([place]) 12/20/2017  . Hepatitis   . History of blood transfusion    "related to spleen"  . History of hiatal hernia   . History of kidney stones   . Hyperlipidemia   . Hypertension   . Hypothyroidism   . Kidney stone   . Melanoma of forearm, left ([place])   . Migraine    "a few migraines/year" (01/21/2016)  . NSTEMI (non-ST elevated myocardial  infarction) ([place]) 01/21/2016   Archie Endo 01/21/2016  . Obesity   . Stroke [facility]) 04/2003   2 carotid artery stents in place; small left parietal and left hemispheric CVA.Phillis Knack 01/21/2016  . Tobacco use disorder    50 pack years; questionably discontinued in 2010  . Wears glasses     Patient Active Problem List   Diagnosis Date Noted  . Hepatic cirrhosis ([place]) 12/20/2017  . Bradycardia 08/21/2016  . NSTEMI (non-ST elevation myocardial infarction) ([place]) 01/21/2016  . Chronic diastolic CHF (congestive heart failure) ([place]) 01/01/2016  . Symptomatic bradycardia 09/30/2015  . CVA (cerebral infarction) 09/30/2015  . 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 Essential hypertension 09/30/2015  . Chronic pain 09/30/2015  . Hyperlipidemia 09/30/2015  . Surgical menopause on hormone replacement therapy 09/30/2015  . GERD (gastroesophageal reflux disease) 09/30/2015  . Paroxysmal atrial fibrillation ([place]) 09/30/2015  . Hypothyroidism 09/30/2015  . Complete heart block ([place]) 09/30/2015  . Breast mass 04/27/2013  . Deltoid tendinitis of left shoulder 11/23/2012  . Rotator cuff tear 10/06/2012  . Rotator cuff syndrome of left shoulder 10/06/2012  . Pain in joint, shoulder region 10/06/2012  . Tendonitis, calcific, shoulder 07/26/2012  . CVA (cerebral infarction)   . COPD (chronic obstructive pulmonary disease) ([place])   . Hypertension   . Hyperlipidemia   . Obesity   . Chest tightness   . Tobacco use  disorder     Past Surgical History:  Procedure Laterality Date  . ABDOMINAL HYSTERECTOMY  1978  . APPENDECTOMY    . BREAST BIOPSY Left 1970s; 2015  . BREAST LUMPECTOMY Left 1970s  . BREAST LUMPECTOMY WITH NEEDLE LOCALIZATION Left 07/07/2013   Procedure: BREAST LUMPECTOMY WITH NEEDLE LOCALIZATION;  Surgeon: Merrie Roof, MD;  Location: [place];  Service: General;  Laterality: Left;  . CARDIAC CATHETERIZATION N/A 01/22/2016   Procedure: Left Heart Cath and Coronary Angiography;  Surgeon:  Peter M Martinique, MD;  Location: [place] CV LAB;  Service: Cardiovascular;  Laterality: N/A;  . CARDIAC CATHETERIZATION N/A 01/22/2016   Procedure: Coronary Stent Intervention;  Surgeon: Peter M Martinique, MD;  Location: [place] [facility];  Service: Cardiovascular;  Laterality: N/A;  . CAROTID STENT Left 04/2003   small left parietal and left hemispheric CVA/notes 07/29/2010  . CHOLECYSTECTOMY OPEN    . COLONOSCOPY  2011  . ESOPHAGOGASTRODUODENOSCOPY  08/06/2011   Procedure: ESOPHAGOGASTRODUODENOSCOPY (EGD);  Surgeon: Rogene Houston, MD;  [facility];  Service: Endoscopy;  Laterality: N/A;  . FLEXIBLE SIGMOIDOSCOPY  08/06/2011   Procedure: FLEXIBLE SIGMOIDOSCOPY;  Surgeon: Rogene Houston, MD;  [facility];  Service: Endoscopy;  Laterality: N/A;  . INGUINAL HERNIA REPAIR Left 1970s  . LARYNX SURGERY  1970s   Polyps excised  . MELANOMA EXCISION Left ~ 2016   forearm  . 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   spontaneous rupture     OB History    Gravida  2   Para  2   Term  2   Preterm  0   AB  0   Living  2     SAB  0   TAB  0   Ectopic  0   Multiple      Live Births               Home Medications    Prior to Admission medications   Medication Sig Start Date End Date Taking? Authorizing Provider  albuterol (PROVENTIL HFA;VENTOLIN HFA) 108 (90 Base) MCG/ACT inhaler Inhale 2 puffs into the lungs every 6 (six) hours as needed for wheezing or shortness of breath.   Yes [provider]  albuterol (PROVENTIL) (2.5 MG/3ML) 0.083% nebulizer solution Take 2.5 mg by nebulization every 6 (six) hours as needed for wheezing or shortness of breath.   Yes [provider]  amLODipine (NORVASC) 10 MG tablet TAKE ONE TABLET BY MOUTH ONCE DAILY. Patient taking differently: Take 10 mg by mouth daily.  10/15/17  Yes BranchAlphonse Guild, MD  aspirin EC 81 MG tablet Take 1 tablet (81 mg total) by mouth daily. 01/24/16  Yes Arbutus Leas, NP  atorvastatin  (LIPITOR) 80 MG tablet TAKE ONE TABLET BY MOUTH DAILY AT 6 P.M. Patient taking differently: Take 80 mg by mouth every evening.  03/01/18  Yes BranchAlphonse Guild, MD  azaTHIOprine (IMURAN) 50 MG tablet Take 1.5 tablets (75 mg total) by mouth daily. 02/24/18  Yes Setzer, Rona Ravens, NP  carvedilol (COREG) 3.125 MG tablet Take 1 tablet (3.125 mg total) by mouth 2 (two) times daily with a meal. 06/18/17  Yes Branch, Alphonse Guild, MD  cetirizine (ZYRTEC) 10 MG tablet Take 10 mg by mouth daily.   Yes [provider]  clorazepate (TRANXENE) 7.5 MG tablet Take 7.5 mg by mouth 3 (three) times daily.  01/03/16  Yes [provider]  famotidine (PEPCID) 20 MG tablet  Take 20 mg by mouth at bedtime. 03/28/18  Yes [provider]  furosemide (LASIX) 40 MG tablet Take 40 mg by mouth daily as needed for fluid.    Yes [provider]  HYDROcodone-acetaminophen (NORCO) 10-325 MG tablet Take 1 tablet by mouth 3 (three) times daily as needed for moderate pain.    Yes [provider]  isosorbide mononitrate (ISMO,MONOKET) 20 MG tablet Take 20 mg by mouth 2 (two) times daily at 10 AM and 5 PM.   Yes [provider]  levothyroxine (SYNTHROID, LEVOTHROID) 50 MCG tablet Take 50 mcg by mouth daily before breakfast.  07/20/14  Yes [provider]  nitroGLYCERIN (NITROSTAT) 0.4 MG SL tablet Place 0.4 mg under the tongue every 5 (five) minutes as needed for chest pain.    Yes [provider]  pantoprazole (PROTONIX) 40 MG tablet Take 40 mg by mouth every morning.  09/13/15  Yes [provider]  potassium chloride SA (K-DUR,KLOR-CON) 20 MEQ tablet Take 20 mEq by mouth daily as needed (when Lasix 40mg  is taken).  08/19/15  Yes [provider]  senna (SENOKOT) 8.6 MG tablet Take 1 tablet by mouth at bedtime.    Yes [provider]  SPIRIVA RESPIMAT 2.5 MCG/ACT AERS Inhale 1 puff into the lungs daily.  04/01/18  Yes [provider]    ticagrelor (BRILINTA) 90 MG TABS tablet Take 1 tablet (90 mg total) by mouth 2 (two) times daily. 02/14/18  Yes Branch, Alphonse Guild, MD  lisinopril (PRINIVIL,ZESTRIL) 20 MG tablet Take 1 tablet (20 mg total) by mouth daily. 05/04/17 04/11/18  Arnoldo Lenis, MD  magic mouthwash w/lidocaine SOLN Take 5 mLs by mouth 3 (three) times daily as needed for mouth pain. Swish and spit, do not swallow Patient not taking: Reported on 04/11/2018 03/05/18   Kem Parkinson, PA-C    Family History Family History  Problem Relation Age of Onset  . Heart failure Mother   . Cancer Father   . Heart failure Father   . Cancer Sister   . Dementia Sister   . Heart disease Other   . Arthritis Other   . Cancer Other   . Diabetes Other   . Kidney disease Other   . Cancer Sister   . Heart failure Brother     Social History Social History   Tobacco Use  . Smoking status: Former Smoker    Packs/day: 1.00    Years: 40.00    Pack years: 40.00    Types: Cigarettes    Last attempt to quit: 07/06/2007    Years since quitting: 10.7  . Smokeless tobacco: Never Used  Substance Use Topics  . Alcohol use: No    Alcohol/week: 0.0 standard drinks  . Drug use: No     Allergies   Tape and Latex   Review of Systems Review of Systems  All systems reviewed and negative, other than as noted in HPI. Physical Exam Updated Vital Signs BP 137/72 (BP Location: Left Arm)   Pulse (!) 103   Temp 98.5 F (36.9 C) (Oral)   Resp (!) 22   Ht 5\' 3"  (1.6 m)   Wt 89.4 kg   SpO2 96%   BMI 34.90 kg/m   Physical Exam Vitals signs and nursing note reviewed.  Constitutional:      Appearance: She is well-developed. She is obese. She is not toxic-appearing.  HENT:     Head: Normocephalic and atraumatic.  Eyes:     General:  Right eye: No discharge.        Left eye: No discharge.     Conjunctiva/sclera: Conjunctivae normal.  Neck:     Musculoskeletal: Neck supple.  Cardiovascular:     Rate and Rhythm:  Regular rhythm. Tachycardia present.     Heart sounds: Normal heart sounds. No murmur. No friction rub. No gallop.   Pulmonary:     Effort: No respiratory distress.     Breath sounds: Wheezing present.     Comments: Mild tachypnea Abdominal:     General: There is no distension.     Palpations: Abdomen is soft.     Tenderness: There is no abdominal tenderness.  Musculoskeletal:        General: No tenderness.     Comments: Lower extremities symmetric as compared to each other. No calf tenderness. Negative Homan's. No palpable cords.   Skin:    General: Skin is warm and dry.  Neurological:     Mental Status: She is alert.  Psychiatric:        Behavior: Behavior normal.        Thought Content: Thought content normal.      ED Treatments / Results  Labs (all labs ordered are listed, but only abnormal results are displayed) Labs Reviewed  COMPREHENSIVE METABOLIC PANEL - Abnormal; Notable for the following components:      Result Value   Glucose, Bld 285 (*)    BUN 5 (*)    Calcium 8.7 (*)    All other components within normal limits  CBC WITH DIFFERENTIAL/PLATELET  TROPONIN I  BRAIN NATRIURETIC PEPTIDE  PATHOLOGIST SMEAR REVIEW    EKG EKG Interpretation  Date/Time:  Tuesday April 19 2018 09:02:42 EST Ventricular Rate:  102 PR Interval:    QRS Duration: 93 QT Interval:  385 QTC Calculation: 502 R Axis:   7 Text Interpretation:  Sinus tachycardia Multiple ventricular premature complexes Probable anteroseptal infarct, old Prolonged QT interval Confirmed by Virgel Manifold [phone]) on 04/19/2018 9:26:53 AM   Radiology Dg Chest 2 View  Result Date: 04/19/2018 CLINICAL DATA:  Shortness of breath since last night EXAM: CHEST - 2 VIEW COMPARISON:  08/21/2016 FINDINGS: Large lung volumes with diaphragm flattening. Interstitial coarsening. Normal heart size and mediastinal contours. Coronary stenting. IMPRESSION: No evidence of acute disease. Hyperinflation. Electronically  Signed   By: Monte Fantasia M.D.   On: 04/19/2018 07:01    Procedures Procedures (including critical care time)  Medications Ordered in ED Medications  0.9 %  sodium chloride infusion ( Intravenous New Bag/Given 04/19/18 0911)  albuterol (PROVENTIL) (2.5 MG/3ML) 0.083% nebulizer solution 5 mg (5 mg Nebulization Given 04/19/18 0650)  ipratropium-albuterol (DUONEB) 0.5-2.5 (3) MG/3ML nebulizer solution 3 mL (3 mLs Nebulization Given 04/19/18 0811)  morphine 4 MG/ML injection 4 mg (4 mg Intravenous Given 04/19/18 0800)  methylPREDNISolone sodium succinate (SOLU-MEDROL) 40 mg/mL injection 60 mg (60 mg Intravenous Given 04/19/18 0814)  sodium chloride 0.9 % bolus 1,000 mL (0 mLs Intravenous Stopped 04/19/18 0909)  insulin aspart (novoLOG) injection 6 Units (6 Units Intravenous Given 04/19/18 0819)     Initial Impression / Assessment and Plan / ED Course  I have reviewed the triage vital signs and the nursing notes.  Pertinent labs & imaging results that were available during my care of the patient were reviewed by me and considered in my medical decision making (see chart for details).     68 year old female with cough and dyspnea.  Suspect COPD exacerbation.  Wheezing on exam.  She needs 2 L of supplemental oxygen to keep her sats in the 90s.  History of COPD.  May be an exacerbation.  She reports subjective fevers and chills although she is afebrile here in the emergency room.  No leukocytosis.  Chest x-ray without focal infiltrate.  Does not appear to be volume overloaded.  Recently noted hyperglycemia.  She has been on steroids intermittently over the past month.  She has been off for several days at this point though.  She is not acidotic.  I feel she would benefit from further steroids with her wheezing and worsening symptoms.  Given the oxygen requirement, will admit.  Final Clinical Impressions(s) / ED Diagnoses   Final diagnoses:  COPD exacerbation [facility])    ED Discharge Orders    None        Virgel Manifold, MD 04/21/18 1319

## 2018-04-19 NOTE — H&P (Signed)
History and Physical  [name]:[id number] DOB: 08-12-1950 DOA: 04/19/2018  Referring physician: Wilson Singer  PCP: Lemmie Evens, MD   Chief Complaint: SOB  HPI: [name] is a 68 y.o. female former smoker with COPD presented to the emergency department with progressive shortness of breath for the past several days.  She also reports fever and feeling flushed in the face at home.  She also reports that she had chills.  She said that she had recently completed a course of antibiotics.  She says that she was exposed to a lady that had influenza last week when she was seen in the emergency department.  The patient says that she has been coughing and wheezing and denies having chest pain.  She says that she has been using her respiratory medications with no significant improvement in the symptoms.  She reports that she has not been able to ambulate more than 20 feet without significant shortness of breath.  She does not normally wear oxygen at home.  She also says that she has recently completed a course of steroids that she was taking for an episode of swelling in the neck and throat.  She has had elevated blood sugar since that time.  She says that she does not have a history of diabetes mellitus.  She does have prediabetes.  She reports generalized malaise.  ED course: Noted to be wheezing and coughing on arrival and hypoxic and placed on 2 L supplemental oxygen with improvement in symptoms.  She was noted to be wheezing and coughing and was treated with nebulizers and steroids.  She continued to require supplemental oxygen.  She was also noticed to be hyperglycemic with a blood sugar of around 500.  She has been started on IV fluids.  She is also been given additional steroids and nebulizer treatments and hospital admission was requested for further management.  Review of Systems: All systems reviewed and apart from history of presenting illness, are negative.  Past Medical History:    Diagnosis Date  . Anxiety   . Arthritis    "hands, arms, back, neck, knees, fingers" (01/21/2016)  . Atrial fibrillation (HCC)    ASA daiy; came off Coumadin shortly after stroke  . Breast cancer, left breast ([place]) 1970s; 2015  . Cancer of skin of leg    BLE  . 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HCC)    left  . Chronic lower back pain   . Chronic pain   . COPD (chronic obstructive pulmonary disease) ([place])   . Coronary artery disease   . CVA (cerebral vascular accident) [facility]) pt denies right brain CVA   Right brain secondary to right internal carotid artery dissection for which 2 stents were placed;F/u cerebral angiography in 05/2008-minimal plaque; healing of previously identified left internal carotid artery dissection  . Depression   . GERD (gastroesophageal reflux disease)   . Heart murmur   . Hepatic cirrhosis ([place]) 12/20/2017  . Hepatitis   . History of blood transfusion    "related to spleen"  . History of hiatal hernia   . History of kidney stones   . Hyperlipidemia   . Hypertension   . Hypothyroidism   . Kidney stone   . Melanoma of forearm, left ([place])   . Migraine    "a few migraines/year" (01/21/2016)  . NSTEMI (non-ST elevated myocardial infarction) ([place]) 01/21/2016   Archie Endo 01/21/2016  . Obesity   . Stroke [facility]) 04/2003   2 carotid artery stents in place; small  left parietal and left hemispheric CVA.Phillis Knack 01/21/2016  . Tobacco use disorder    50 pack years; questionably discontinued in 2010  . Wears glasses    Past Surgical History:  Procedure Laterality Date  . ABDOMINAL HYSTERECTOMY  1978  . APPENDECTOMY    . BREAST BIOPSY Left 1970s; 2015  . BREAST LUMPECTOMY Left 1970s  . BREAST LUMPECTOMY WITH NEEDLE LOCALIZATION Left 07/07/2013   Procedure: BREAST LUMPECTOMY WITH NEEDLE LOCALIZATION;  Surgeon: Merrie Roof, MD;  Location: [facility];  Service: General;  Laterality: Left;  . CARDIAC CATHETERIZATION N/A 01/22/2016   Procedure: Left Heart Cath and  Coronary Angiography;  Surgeon: Peter M Martinique, MD;  Location: [place] CV LAB;  Service: Cardiovascular;  Laterality: N/A;  . CARDIAC CATHETERIZATION N/A 01/22/2016   Procedure: Coronary Stent Intervention;  Surgeon: Peter M Martinique, MD;  Location: [place] [facility];  Service: Cardiovascular;  Laterality: N/A;  . CAROTID STENT Left 04/2003   small left parietal and left hemispheric CVA/notes 07/29/2010  . CHOLECYSTECTOMY OPEN    . COLONOSCOPY  2011  . ESOPHAGOGASTRODUODENOSCOPY  08/06/2011   Procedure: ESOPHAGOGASTRODUODENOSCOPY (EGD);  Surgeon: Rogene Houston, MD;  [facility];  Service: Endoscopy;  Laterality: N/A;  . FLEXIBLE SIGMOIDOSCOPY  08/06/2011   Procedure: FLEXIBLE SIGMOIDOSCOPY;  Surgeon: Rogene Houston, MD;  [facility];  Service: Endoscopy;  Laterality: N/A;  . INGUINAL HERNIA REPAIR Left 1970s  . LARYNX SURGERY  1970s   Polyps excised  . MELANOMA EXCISION Left ~ 2016   forearm  . 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   spontaneous rupture   Social History:  reports that she quit smoking about 10 years ago. Her smoking use included cigarettes. She has a 40.00 pack-year smoking history. She has never used smokeless tobacco. She reports that she does not drink alcohol or use drugs.  Allergies  Allergen Reactions  . Tape Other (See Comments)    Causes blisters to form  . Latex Rash and Other (See Comments)    Blisters    Family History  Problem Relation Age of Onset  . Heart failure Mother   . Cancer Father   . Heart failure Father   . Cancer Sister   . Dementia Sister   . Heart disease Other   . Arthritis Other   . Cancer Other   . Diabetes Other   . Kidney disease Other   . Cancer Sister   . Heart failure Brother     Prior to Admission medications   Medication Sig Start Date End Date Taking? Authorizing Provider  albuterol (PROVENTIL HFA;VENTOLIN HFA) 108 (90 Base) MCG/ACT inhaler Inhale 2 puffs into the lungs every 6 (six) hours as needed  for wheezing or shortness of breath.   Yes [provider]  albuterol (PROVENTIL) (2.5 MG/3ML) 0.083% nebulizer solution Take 2.5 mg by nebulization every 6 (six) hours as needed for wheezing or shortness of breath.   Yes [provider]  amLODipine (NORVASC) 10 MG tablet TAKE ONE TABLET BY MOUTH ONCE DAILY. Patient taking differently: Take 10 mg by mouth daily.  10/15/17  Yes BranchAlphonse Guild, MD  aspirin EC 81 MG tablet Take 1 tablet (81 mg total) by mouth daily. 01/24/16  Yes Arbutus Leas, NP  atorvastatin (LIPITOR) 80 MG tablet TAKE ONE TABLET BY MOUTH DAILY AT 6 P.M. Patient taking differently: Take 80 mg by mouth every evening.  03/01/18  Yes Branch, Alphonse Guild, MD  azaTHIOprine Ilean Skill)  50 MG tablet Take 1.5 tablets (75 mg total) by mouth daily. 02/24/18  Yes Setzer, Rona Ravens, NP  carvedilol (COREG) 3.125 MG tablet Take 1 tablet (3.125 mg total) by mouth 2 (two) times daily with a meal. 06/18/17  Yes Branch, Alphonse Guild, MD  cetirizine (ZYRTEC) 10 MG tablet Take 10 mg by mouth daily.   Yes [provider]  clorazepate (TRANXENE) 7.5 MG tablet Take 7.5 mg by mouth 3 (three) times daily.  01/03/16  Yes [provider]  famotidine (PEPCID) 20 MG tablet Take 20 mg by mouth 2 (two) times daily.  03/28/18  Yes [provider]  furosemide (LASIX) 40 MG tablet Take 40 mg by mouth daily as needed for fluid.    Yes [provider]  HYDROcodone-acetaminophen (NORCO) 10-325 MG tablet Take 1 tablet by mouth 3 (three) times daily as needed for moderate pain.    Yes [provider]  isosorbide mononitrate (ISMO,MONOKET) 20 MG tablet Take 20 mg by mouth 2 (two) times daily at 10 AM and 5 PM.   Yes [provider]  levothyroxine (SYNTHROID, LEVOTHROID) 50 MCG tablet Take 50 mcg by mouth daily before breakfast.  07/20/14  Yes [provider]  lisinopril (PRINIVIL,ZESTRIL) 20 MG tablet Take 1 tablet (20 mg total) by mouth daily. 05/04/17  04/19/18 Yes Branch, Alphonse Guild, MD  nitroGLYCERIN (NITROSTAT) 0.4 MG SL tablet Place 0.4 mg under the tongue every 5 (five) minutes as needed for chest pain.    Yes [provider]  pantoprazole (PROTONIX) 40 MG tablet Take 40 mg by mouth every morning.  09/13/15  Yes [provider]  potassium chloride SA (K-DUR,KLOR-CON) 20 MEQ tablet Take 20 mEq by mouth daily as needed (when Lasix 40mg  is taken).  08/19/15  Yes [provider]  senna (SENOKOT) 8.6 MG tablet Take 1 tablet by mouth at bedtime.    Yes [provider]  SPIRIVA RESPIMAT 2.5 MCG/ACT AERS Inhale 1 puff into the lungs daily.  04/01/18  Yes [provider]  ticagrelor (BRILINTA) 90 MG TABS tablet Take 1 tablet (90 mg total) by mouth 2 (two) times daily. 02/14/18  Yes Arnoldo Lenis, MD   Physical Exam: Vitals:   04/19/18 0812 04/19/18 0909 04/19/18 0930 04/19/18 1000  BP:  137/72 128/71 117/84  Pulse:  (!) 103 (!) 101 (!) 103  Resp:  (!) 22 (!) 23 (!) 26  Temp:      TempSrc:      SpO2: 92% 96% 96% 96%  Weight:      Height:         General exam: Moderately built and nourished patient, lying comfortably supine on the gurney in no obvious distress.  Head, eyes and ENT: Nontraumatic and normocephalic. Pupils equally reacting to light and accommodation. Oral mucosa dry.   Neck: Supple. No JVD, carotid bruit or thyromegaly.  Lymphatics: No lymphadenopathy.  Respiratory system: fine bibasilar wheezes. No increased work of breathing.  Cardiovascular system: S1 and S2 heard, RRR. No JVD, murmurs, gallops, clicks or pedal edema.  Gastrointestinal system: Abdomen is nondistended, soft and nontender. Normal bowel sounds heard. No organomegaly or masses appreciated.  Central nervous system: Alert and oriented. No focal neurological deficits.  Extremities: Symmetric 5 x 5 power. Peripheral pulses symmetrically felt.   Skin: No rashes or acute findings.  Musculoskeletal system:  Negative exam.  Psychiatry: Pleasant and cooperative.  Labs on Admission:  Basic Metabolic Panel: Recent Labs  Lab 04/19/18 0659  NA 135  K 3.5  CL 102  CO2 23  GLUCOSE 285*  BUN 5*  CREATININE 0.55  CALCIUM 8.7*   Liver Function Tests: Recent Labs  Lab 04/19/18 0659  AST 37  ALT 31  ALKPHOS 58  BILITOT 0.4  PROT 7.1  ALBUMIN 3.5   No results for input(s): LIPASE, AMYLASE in the last 168 hours. No results for input(s): AMMONIA in the last 168 hours. CBC: Recent Labs  Lab 04/19/18 0659  WBC 7.6  NEUTROABS 3.5  HGB 14.0  HCT 44.0  MCV 88.7  PLT 295   Cardiac Enzymes: Recent Labs  Lab 04/19/18 0659  TROPONINI <0.03    BNP (last 3 results) No results for input(s): PROBNP in the last 8760 hours. CBG: No results for input(s): GLUCAP in the last 168 hours.  Radiological Exams on Admission: Dg Chest 2 View  Result Date: 04/19/2018 CLINICAL DATA:  Shortness of breath since last night EXAM: CHEST - 2 VIEW COMPARISON:  08/21/2016 FINDINGS: Large lung volumes with diaphragm flattening. Interstitial coarsening. Normal heart size and mediastinal contours. Coronary stenting. IMPRESSION: No evidence of acute disease. Hyperinflation. Electronically Signed   By: Monte Fantasia M.D.   On: 04/19/2018 07:01    EKG: Independently reviewed.  Sinus tachycardia  Assessment/Plan Principal Problem:   COPD with acute exacerbation (HCC) Active Problems:   COPD (chronic obstructive pulmonary disease) (HCC)   Hypertension   Hyperlipidemia   Obesity   Chronic pain   GERD (gastroesophageal reflux disease)   Paroxysmal atrial fibrillation (HCC)   Hypothyroidism   Chronic diastolic CHF (congestive heart failure) ([place])   Hepatic cirrhosis ([place])   1. Acute COPD exacerbation- patient will be continued on nebulizer treatments and prednisone.  Holding antibiotics at this time as patient recently completed a course of antibiotics.  Suspect this is likely a viral infection  contributing to this acute exacerbation.  Her influenza testing is still pending at this time.  Follow-up on that and continue droplet precautions until flu has been ruled out. 2. Hyperglycemia-secondary to steroids.  The patient has a known history of prediabetes.  She has been on steroids and now having increased blood glucose readings.  I have asked for a hemoglobin A1c.  I have ordered supplemental sliding scale coverage in addition to Lantus daily.  Follow CBGs closely and adjust as needed. 3. Coronary artery disease-resume her home heart medications she is not having any chest pain symptoms at this time. 4. GERD-continue Protonix and famotidine as ordered. 5. Chronic diastolic congestive heart failure-plan to resume home medications, appears compensated at this time.  Follow intake and output.  Patient only takes Lasix as needed. 6. Essential hypertension- resume home blood pressure lowering medications. 7. Chronic pain-resume home pain medication regimen 8. GAD-resume home medications. 9. Both thyroidism-resume home thyroid supplement. 10. History of breast cancer-resume home treatment.   DVT Prophylaxis: SCDs Code Status: Full Family Communication: Patient fully updated at bedside Disposition Plan: Inpatient MedSurg  Time spent: 52 mins  Sayeed Weatherall Wynetta Emery, MD [facility] How to contact the [facility] Attending or Consulting provider [place] or covering provider during after hours [place], for this patient?  1. Check the care team in [facility] and look for a) attending/consulting [facility] provider listed and b) the [facility] team listed 2. Log into [email or web address] and use [place]'s universal password to access. If you do not have the password, please contact the hospital operator. 3. Locate the [facility] provider you are looking for under [facility] and page to  a number that you can be directly reached. 4. If you still have difficulty reaching the provider, please page the [facility] (Director on Call) for  the Hospitalists listed on amion for assistance.

## 2018-04-20 DIAGNOSIS — Z6835 Body mass index (BMI) 35.0-35.9, adult: Secondary | ICD-10-CM

## 2018-04-20 DIAGNOSIS — E669 Obesity, unspecified: Secondary | ICD-10-CM

## 2018-04-20 DIAGNOSIS — E1165 Type 2 diabetes mellitus with hyperglycemia: Secondary | ICD-10-CM

## 2018-04-20 LAB — BASIC METABOLIC PANEL
Anion gap: 7 (ref 5–15)
BUN: 11 mg/dL (ref 8–23)
CO2: 24 mmol/L (ref 22–32)
Calcium: 8.8 mg/dL — ABNORMAL LOW (ref 8.9–10.3)
Chloride: 105 mmol/L (ref 98–111)
Creatinine, Ser: 0.44 mg/dL (ref 0.44–1.00)
GFR calc Af Amer: >60 mL/min (ref >60)
GFR calc non Af Amer: >60 mL/min (ref >60)
Glucose, Bld: 176 mg/dL — ABNORMAL HIGH (ref 70–99)
Potassium: 4.1 mmol/L (ref 3.5–5.1)
Sodium: 136 mmol/L (ref 135–145)

## 2018-04-20 LAB — CBC
HCT: 40.8 % (ref 36.0–46.0)
Hemoglobin: 13.1 g/dL (ref 12.0–15.0)
MCH: 28.8 pg (ref 26.0–34.0)
MCHC: 32.1 g/dL (ref 30.0–36.0)
MCV: 89.7 fL (ref 80.0–100.0)
PLATELETS: 322 10*3/uL (ref 150–400)
RBC: 4.55 MIL/uL (ref 3.87–5.11)
RDW: 14.4 % (ref 11.5–15.5)
WBC: 14.8 10*3/uL — ABNORMAL HIGH (ref 4.0–10.5)
nRBC: 0 % (ref 0.0–0.2)

## 2018-04-20 LAB — MAGNESIUM: Magnesium: 1.6 mg/dL — ABNORMAL LOW (ref 1.7–2.4)

## 2018-04-20 LAB — GLUCOSE, CAPILLARY
Glucose-Capillary: 193 mg/dL — ABNORMAL HIGH (ref 70–99)
Glucose-Capillary: 233 mg/dL — ABNORMAL HIGH (ref 70–99)

## 2018-04-20 LAB — PATHOLOGIST SMEAR REVIEW

## 2018-04-20 LAB — HIV ANTIBODY (ROUTINE TESTING W REFLEX): HIV Screen 4th Generation wRfx: NONREACTIVE

## 2018-04-20 MED ORDER — INSULIN STARTER KIT- PEN NEEDLES (ENGLISH)
1.0000 | Freq: Once | Status: DC
  Filled 2018-04-20 (×2): qty 1

## 2018-04-20 MED ORDER — FLUTICASONE-SALMETEROL 250-50 MCG/DOSE IN AEPB: 1.0000 | INHALATION_SPRAY | Freq: Two times a day (BID) | RESPIRATORY_TRACT | 3 refills | Status: DC

## 2018-04-20 MED ORDER — IPRATROPIUM-ALBUTEROL 0.5-2.5 (3) MG/3ML IN SOLN
3.0000 mL | Freq: Three times a day (TID) | RESPIRATORY_TRACT | Status: DC
  Administered 2018-04-20: 3 mL via RESPIRATORY_TRACT

## 2018-04-20 MED ORDER — METFORMIN HCL 500 MG PO TABS: 500.0000 mg | ORAL_TABLET | Freq: Two times a day (BID) | ORAL | 3 refills | Status: DC

## 2018-04-20 MED ORDER — GLIPIZIDE 5 MG PO TABS: 5.0000 mg | ORAL_TABLET | Freq: Every day | ORAL | 3 refills | Status: DC

## 2018-04-20 MED ORDER — PREDNISONE 10 MG PO TABS: ORAL_TABLET | ORAL | 0 refills | Status: DC

## 2018-04-20 NOTE — Plan of Care (Signed)

## 2018-04-20 NOTE — Plan of Care (Signed)
  Problem: Education: Goal: Knowledge of General Education information will improve Description Including pain rating scale, medication(s)/side effects and non-pharmacologic comfort measures 04/20/2018 1624 by Rance Muir, RN Outcome: Adequate for Discharge 04/20/2018 1023 by Rance Muir, RN Outcome: Progressing   Problem: Health Behavior/Discharge Planning: Goal: Ability to manage health-related needs will improve 04/20/2018 1624 by Rance Muir, RN Outcome: Adequate for Discharge 04/20/2018 1023 by Rance Muir, RN Outcome: Progressing   Problem: Clinical Measurements: Goal: Ability to maintain clinical measurements within normal limits will improve 04/20/2018 1624 by Rance Muir, RN Outcome: Adequate for Discharge 04/20/2018 1023 by Rance Muir, RN Outcome: Progressing Goal: Will remain free from infection 04/20/2018 1624 by Rance Muir, RN Outcome: Adequate for Discharge 04/20/2018 1023 by Rance Muir, RN Outcome: Progressing Goal: Diagnostic test results will improve 04/20/2018 1624 by Rance Muir, RN Outcome: Adequate for Discharge 04/20/2018 1023 by Rance Muir, RN Outcome: Progressing Goal: Respiratory complications will improve 04/20/2018 1624 by Rance Muir, RN Outcome: Adequate for Discharge 04/20/2018 1023 by Rance Muir, RN Outcome: Progressing Goal: Cardiovascular complication will be avoided 04/20/2018 1624 by Rance Muir, RN Outcome: Adequate for Discharge 04/20/2018 1023 by Rance Muir, RN Outcome: Progressing   Problem: Activity: Goal: Risk for activity intolerance will decrease 04/20/2018 1624 by Rance Muir, RN Outcome: Adequate for Discharge 04/20/2018 1023 by Rance Muir, RN Outcome: Progressing   Problem: Nutrition: Goal: Adequate nutrition will be maintained 04/20/2018 1624 by Rance Muir, RN Outcome: Adequate for Discharge 04/20/2018 1023 by Rance Muir, RN Outcome: Progressing   Problem: Coping: Goal: Level of anxiety will decrease 04/20/2018 1624 by Rance Muir, RN Outcome: Adequate for Discharge 04/20/2018  1023 by Rance Muir, RN Outcome: Progressing   Problem: Elimination: Goal: Will not experience complications related to bowel motility 04/20/2018 1624 by Rance Muir, RN Outcome: Adequate for Discharge 04/20/2018 1023 by Rance Muir, RN Outcome: Progressing Goal: Will not experience complications related to urinary retention 04/20/2018 1624 by Rance Muir, RN Outcome: Adequate for Discharge 04/20/2018 1023 by Rance Muir, RN Outcome: Progressing   Problem: Pain Managment: Goal: General experience of comfort will improve 04/20/2018 1624 by Rance Muir, RN Outcome: Adequate for Discharge 04/20/2018 1023 by Rance Muir, RN Outcome: Progressing   Problem: Safety: Goal: Ability to remain free from injury will improve 04/20/2018 1624 by Rance Muir, RN Outcome: Adequate for Discharge 04/20/2018 1023 by Rance Muir, RN Outcome: Progressing   Problem: Skin Integrity: Goal: Risk for impaired skin integrity will decrease 04/20/2018 1624 by Rance Muir, RN Outcome: Adequate for Discharge 04/20/2018 1023 by Rance Muir, RN Outcome: Progressing

## 2018-04-20 NOTE — Discharge Summary (Signed)
Physician Discharge Summary  [name] GMW:[id number] DOB: July 10, 1950 DOA: 04/19/2018  PCP: Lemmie Evens, MD  Admit date: 04/19/2018 Discharge date: 04/20/2018  Time spent: 35 minutes  Recommendations for Outpatient Follow-up:  1. Repeat BMET to follow electrolytes and renal function. 2. Please follow closely CBG's and A1C; adjust hypoglycemic regimen and initiate insulin therapy as required.   Discharge Diagnoses:  Principal Problem:   COPD with acute exacerbation ([place]) Active Problems:   COPD (chronic obstructive pulmonary disease) (HCC)   Hypertension   Hyperlipidemia   Obesity   Chronic pain   GERD (gastroesophageal reflux disease)   Paroxysmal atrial fibrillation (HCC)   Hypothyroidism   Chronic diastolic CHF (congestive heart failure) (HCC)   Hepatic cirrhosis (HCC)   Type 2 diabetes mellitus with hyperglycemia, without long-term current use of insulin ([place])   Discharge Condition: stable and improved, patient discharge home with instructions to follow up with PCP in 10 days.  Diet recommendation: heart healthy diet, low calorie and modified carbohydrates.  Filed Weights   04/19/18 0617  Weight: 89.4 kg    History of present illness:  As per H&P written by Dr. Wynetta Emery on 04/19/2018 68 y.o. female former smoker with COPD presented to the emergency department with progressive shortness of breath for the past several days.  She also reports fever and feeling flushed in the face at home.  She also reports that she had chills.  She said that she had recently completed a course of antibiotics.  She says that she was exposed to a lady that had influenza last week when she was seen in the emergency department.  The patient says that she has been coughing and wheezing and denies having chest pain.  She says that she has been using her respiratory medications with no significant improvement in the symptoms.  She reports that she has not been able to ambulate more than 20 feet  without significant shortness of breath.  She does not normally wear oxygen at home.  She also says that she has recently completed a course of steroids that she was taking for an episode of swelling in the neck and throat.  She has had elevated blood sugar since that time.  She says that she does not have a history of diabetes mellitus.  She does have prediabetes.  She reports generalized malaise.  ED course: Noted to be wheezing and coughing on arrival and hypoxic and placed on 2 L supplemental oxygen with improvement in symptoms.  She was noted to be wheezing and coughing and was treated with nebulizers and steroids.  She continued to require supplemental oxygen.  She was also noticed to be hyperglycemic with a blood sugar of around 500.  She has been started on IV fluids.  She is also been given additional steroids and nebulizer treatments and hospital admission was requested for further management.   Hospital Course:  1-COPD exacerbation -has recently complete antibiotics as an outpatient; also no infiltrates seen on CXR. -patient treated with nebulizer management and IV steroids. -rapid response and stabilization prior to discharge -no need of O2 supplementation appreciated -discharge home on steroids tapering, initiation of Advair and Spiriva and continue PRN albuterol. -patient afebrile and speaking in full sentences -will need outpatient follow up with pulmonary service to further adjust regimen for COP.  2-type 2 diabetes with hyperglycemia -A1C 13 -has never been on any meds -will discharge on metformin and glipizide -patient with hesitation about insulin initiation. -close follow up to CBG's and A1C  recommended  3-CAD -no CP -will resume her home heart medication regimen (Imdur, coreg, brillinta and ACE inhibitors)  -heart healthy diet encouraged -no CP reported.  4-GERD -continue PPI  5-chronic diastolic HF -compensated -continue daily weights, low sodium diet and PRN  diuretics.  6-HTN -stable and well control -resume home antihypertensive regimen.  7-chronic pain syndrome -stable -no narcotics prescription given at discharge -continue home analgesic regimen.  8-hypothyroidism -continue synthroid   9-HLD  -continue statins.  10-class 2 obesity -Body mass index is 34.9 kg/m. -low calorie diet, portion control and increase physical activity discussed with patient.  Procedures:  See below for x-ray reports.  Consultations:  None   Discharge Exam: Vitals:   04/20/18 0834 04/20/18 1329  BP:    Pulse:    Resp:    Temp:    SpO2: 92% 91%    General: afebrile, no CP, speaking in full sentences and with good O2 sat on RA. Cardiovascular: S1 and S2, no rubs, no gallops. Respiratory: mild exp wheezing, positive rhonchi, no using accessory muscles. Abd: soft, NT, ND, positive BS Extremities: no cyanosis, no clubbing.  Discharge Instructions   Discharge Instructions    Discharge instructions   Complete by:  As directed    Please follow modified carbohydrate and heart healthy diet Take medications as prescribed Check CBG's 3 times a day and create a log with results to further adjust hypoglycemic regimen and follow-up visit.   Maintain adequate hydration Check weight on daily basis.     Allergies as of 04/20/2018      Reactions   Tape Other (See Comments)   Causes blisters to form   Latex Rash, Other (See Comments)   Blisters      Medication List    TAKE these medications   albuterol (2.5 MG/3ML) 0.083% nebulizer solution Commonly known as:  PROVENTIL Take 2.5 mg by nebulization every 6 (six) hours as needed for wheezing or shortness of breath.   albuterol 108 (90 Base) MCG/ACT inhaler Commonly known as:  PROVENTIL HFA;VENTOLIN HFA Inhale 2 puffs into the lungs every 6 (six) hours as needed for wheezing or shortness of breath.   amLODipine 10 MG tablet Commonly known as:  NORVASC TAKE ONE TABLET BY MOUTH ONCE DAILY.    aspirin EC 81 MG tablet Take 1 tablet (81 mg total) by mouth daily.   atorvastatin 80 MG tablet Commonly known as:  LIPITOR TAKE ONE TABLET BY MOUTH DAILY AT 6 P.M. What changed:  See the new instructions.   azaTHIOprine 50 MG tablet Commonly known as:  IMURAN Take 1.5 tablets (75 mg total) by mouth daily.   carvedilol 3.125 MG tablet Commonly known as:  COREG Take 1 tablet (3.125 mg total) by mouth 2 (two) times daily with a meal.   cetirizine 10 MG tablet Commonly known as:  ZYRTEC Take 10 mg by mouth daily.   clorazepate 7.5 MG tablet Commonly known as:  TRANXENE Take 7.5 mg by mouth 3 (three) times daily.   famotidine 20 MG tablet Commonly known as:  PEPCID Take 20 mg by mouth 2 (two) times daily.   Fluticasone-Salmeterol 250-50 MCG/DOSE Aepb Commonly known as:  ADVAIR DISKUS Inhale 1 puff into the lungs 2 (two) times daily.   furosemide 40 MG tablet Commonly known as:  LASIX Take 40 mg by mouth daily as needed for fluid.   glipiZIDE 5 MG tablet Commonly known as:  GLUCOTROL Take 1 tablet (5 mg total) by mouth daily before breakfast.  HYDROcodone-acetaminophen 10-325 MG tablet Commonly known as:  NORCO Take 1 tablet by mouth 3 (three) times daily as needed for moderate pain.   isosorbide mononitrate 20 MG tablet Commonly known as:  ISMO,MONOKET Take 20 mg by mouth 2 (two) times daily at 10 AM and 5 PM.   levothyroxine 50 MCG tablet Commonly known as:  SYNTHROID, LEVOTHROID Take 50 mcg by mouth daily before breakfast.   lisinopril 20 MG tablet Commonly known as:  PRINIVIL,ZESTRIL Take 1 tablet (20 mg total) by mouth daily.   metFORMIN 500 MG tablet Commonly known as:  GLUCOPHAGE Take 1 tablet (500 mg total) by mouth 2 (two) times daily with a meal.   nitroGLYCERIN 0.4 MG SL tablet Commonly known as:  NITROSTAT Place 0.4 mg under the tongue every 5 (five) minutes as needed for chest pain.   pantoprazole 40 MG tablet Commonly known as:   PROTONIX Take 40 mg by mouth every morning.   potassium chloride SA 20 MEQ tablet Commonly known as:  K-DUR,KLOR-CON Take 20 mEq by mouth daily as needed (when Lasix 40mg  is taken).   predniSONE 10 MG tablet Commonly known as:  DELTASONE Take 6 tablets by mouth daily x1 day; then 4 tablets by mouth daily x2 days; then 2 tablets by mouth daily x3 days; then 1 tablet by mouth daily x3 days and stop prednisone.   senna 8.6 MG tablet Commonly known as:  SENOKOT Take 1 tablet by mouth at bedtime.   SPIRIVA RESPIMAT 2.5 MCG/ACT Aers Generic drug:  Tiotropium Bromide Monohydrate Inhale 1 puff into the lungs daily.   ticagrelor 90 MG Tabs tablet Commonly known as:  BRILINTA Take 1 tablet (90 mg total) by mouth 2 (two) times daily.      Allergies  Allergen Reactions  . Tape Other (See Comments)    Causes blisters to form  . Latex Rash and Other (See Comments)    Blisters   Follow-up Information    Lemmie Evens, MD. Schedule an appointment as soon as possible for a visit in 10 day(s).   Specialty:  Family Medicine Contact information: [place] [address] [phone]        Arnoldo Lenis, MD .   Specialty:  Cardiology Contact information: [address] [place] [address] [phone]           The results of significant diagnostics from this hospitalization (including imaging, microbiology, ancillary and laboratory) are listed below for reference.    Significant Diagnostic Studies: Dg Chest 2 View  Result Date: 04/19/2018 CLINICAL DATA:  Shortness of breath since last night EXAM: CHEST - 2 VIEW COMPARISON:  08/21/2016 FINDINGS: Large lung volumes with diaphragm flattening. Interstitial coarsening. Normal heart size and mediastinal contours. Coronary stenting. IMPRESSION: No evidence of acute disease. Hyperinflation. Electronically Signed   By: Monte Fantasia M.D.   On: 04/19/2018 07:01    Labs: Basic Metabolic Panel: Recent Labs   Lab 04/19/18 0659 04/19/18 1113 04/20/18 0547  NA 135  --  136  K 3.5  --  4.1  CL 102  --  105  CO2 23  --  24  GLUCOSE 285*  --  176*  BUN 5*  --  11  CREATININE 0.55 0.54 0.44  CALCIUM 8.7*  --  8.8*  MG  --   --  1.6*   Liver Function Tests: Recent Labs  Lab 04/19/18 0659  AST 37  ALT 31  ALKPHOS 58  BILITOT 0.4  PROT  7.1  ALBUMIN 3.5   CBC: Recent Labs  Lab 04/19/18 0659 04/19/18 1113 04/20/18 0547  WBC 7.6 7.4 14.8*  NEUTROABS 3.5  --   --   HGB 14.0 13.6 13.1  HCT 44.0 42.0 40.8  MCV 88.7 89.7 89.7  PLT 295 303 322   Cardiac Enzymes: Recent Labs  Lab 04/19/18 0659  TROPONINI <0.03   BNP: BNP (last 3 results) Recent Labs    04/19/18 0659  BNP 35.0    CBG: Recent Labs  Lab 04/19/18 1125 04/19/18 1634 04/19/18 2221 04/20/18 0757 04/20/18 1113  GLUCAP 335* 307* 215* 193* 233*    Signed:  Barton Dubois MD.  [facility] 04/20/2018, 4:37 PM

## 2018-04-20 NOTE — Progress Notes (Signed)
Inpatient Diabetes Program Recommendations  AACE/ADA: New Consensus Statement on Inpatient Glycemic Control (2015)  Target Ranges:  Prepandial:   less than 140 mg/dL      Peak postprandial:   less than 180 mg/dL (1-2 hours)      Critically ill patients:  140 - 180 mg/dL   Lab Results  Component Value Date   GLUCAP 193 (H) 04/20/2018   HGBA1C 13.0 (H) 04/19/2018    Review of Glycemic Control  Diabetes history: "Pre-diabetes" Outpatient Diabetes medications: None Current orders for Inpatient glycemic control: Lantus 15 units QHS, Novolog 0-20 units tidwc + 5 units tidwc  HgbA1C - 13% Will likely need to go home on insulin. Post-prandials elevated.  Inpatient Diabetes Program Recommendations:     Increase Novolog to 8 units tidwc for meal coverage insulin Will order insulin starter kit and RN to begin teaching insulin administration. Will speak with pt regarding HgbA1C of 13% OP Diabetes Education consult for uncontrolled DM.  Will follow.  Thank you. Lorenda Peck, RD, LDN, CDE Inpatient Diabetes Coordinator [phone]

## 2018-04-20 NOTE — Progress Notes (Signed)
SATURATION QUALIFICATIONS: (This note is used to comply with regulatory documentation for home oxygen)  Patient Saturations on Room Air at Rest = 90%  Patient Saturations on Room Air while Ambulating = 90-92%  Patient Saturations on 2 Liters of oxygen while Ambulating = 92%  Please briefly explain why patient needs home oxygen:

## 2018-05-30 ENCOUNTER — Ambulatory Visit (INDEPENDENT_AMBULATORY_CARE_PROVIDER_SITE_OTHER): Payer: Medicare Other | Admitting: Otolaryngology

## 2018-05-30 DIAGNOSIS — R490 Dysphonia: Secondary | ICD-10-CM

## 2018-05-30 DIAGNOSIS — H6983 Other specified disorders of Eustachian tube, bilateral: Secondary | ICD-10-CM | POA: Diagnosis not present

## 2018-05-30 DIAGNOSIS — K219 Gastro-esophageal reflux disease without esophagitis: Secondary | ICD-10-CM | POA: Diagnosis not present

## 2018-05-31 ENCOUNTER — Other Ambulatory Visit: Payer: Self-pay

## 2018-05-31 ENCOUNTER — Ambulatory Visit (INDEPENDENT_AMBULATORY_CARE_PROVIDER_SITE_OTHER): Payer: Medicare Other | Admitting: Cardiology

## 2018-05-31 ENCOUNTER — Encounter: Payer: Self-pay | Admitting: Cardiology

## 2018-05-31 VITALS — BP 102/68 | HR 87 | Ht 63.0 in | Wt 198.2 lb

## 2018-05-31 DIAGNOSIS — I10 Essential (primary) hypertension: Secondary | ICD-10-CM | POA: Diagnosis not present

## 2018-05-31 DIAGNOSIS — E782 Mixed hyperlipidemia: Secondary | ICD-10-CM

## 2018-05-31 DIAGNOSIS — I5032 Chronic diastolic (congestive) heart failure: Secondary | ICD-10-CM | POA: Diagnosis not present

## 2018-05-31 DIAGNOSIS — I2510 Atherosclerotic heart disease of native coronary artery without angina pectoris: Secondary | ICD-10-CM

## 2018-05-31 NOTE — Progress Notes (Signed)
Clinical Summary Ms. Giovannini is a 68 y.o.female seen today for follow up of the following medical problems.   1. CAD - admit 01/2016 with NSTEMI. Full cath report below. Received DES to OM. Unsuccesful PCI to RCA with wire induced dissection, managed medically. The OM was thought to be the culprit. If recurring symptoms can consider repeat RCA intervention. .    - no recent chest pain - compliant with meds.    2. COPD - 04/2018 admission with COPD exacerbation - still some lingering symptoms.    3. Chronic diastolic HF  - some occasonal LE edema. Recently has been on course of prednisone. Takes her lasix prn.   4. HTN  - compliant with meds   4. Bradycardia - admit 08/2016 with bradycardia - resolved on lower dose of coreg  5.History ofCVA - 2005 at [facility] - she reports history of stentsat the time   6. Hyperlipedemia - Jan 2018 TC 119 TG 99 HDL 35 LDL 64 -labs followed by pcp, compliant with statin  7. 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 Anxiety   . Arthritis    "hands, arms, back, neck, knees, fingers" (01/21/2016)  . Atrial fibrillation (HCC)    ASA daiy; came off Coumadin shortly after stroke  . Breast cancer, left breast ([place]) 1970s; 2015  . Cancer of skin of leg    BLE  . 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HCC)    left  . Chronic lower back pain   . Chronic pain   . COPD (chronic obstructive pulmonary disease) ([place])   . Coronary artery disease   . CVA (cerebral vascular accident) [facility]) pt denies right brain CVA   Right brain secondary to right internal carotid artery dissection for which 2 stents were placed;F/u cerebral angiography in 05/2008-minimal plaque; healing of previously identified left internal carotid artery dissection  . Depression   . GERD (gastroesophageal reflux disease)   . Heart murmur   . Hepatic cirrhosis ([place])  12/20/2017  . Hepatitis   . History of blood transfusion    "related to spleen"  . History of hiatal hernia   . History of kidney stones   . Hyperlipidemia   . Hypertension   . Hypothyroidism   . Kidney stone   . Melanoma of forearm, left ([place])   . Migraine    "a few migraines/year" (01/21/2016)  . NSTEMI (non-ST elevated myocardial infarction) ([place]) 01/21/2016   Archie Endo 01/21/2016  . Obesity   . Stroke [facility]) 04/2003   2 carotid artery stents in place; small left parietal and left hemispheric CVA.Phillis Knack 01/21/2016  . Tobacco use disorder    50 pack years; questionably discontinued in 2010  . Wears glasses      Allergies  Allergen Reactions  . Tape Other (See Comments)    Causes blisters to form  . Latex Rash and Other (See Comments)    Blisters     Current Outpatient Medications  Medication Sig Dispense Refill  . albuterol (PROVENTIL HFA;VENTOLIN HFA) 108 (90 Base) MCG/ACT inhaler Inhale 2 puffs into the lungs every 6 (six) hours as needed for wheezing or shortness of breath.    Marland Kitchen albuterol (PROVENTIL) (2.5 MG/3ML) 0.083% nebulizer solution Take 2.5 mg by nebulization every 6 (six) hours as needed for wheezing or shortness of breath.    Marland Kitchen amLODipine (NORVASC) 10 MG tablet TAKE ONE TABLET BY MOUTH  ONCE DAILY. (Patient taking differently: Take 10 mg by mouth daily. ) 90 tablet 1  . aspirin EC 81 MG tablet Take 1 tablet (81 mg total) by mouth daily.    Marland Kitchen atorvastatin (LIPITOR) 80 MG tablet TAKE ONE TABLET BY MOUTH DAILY AT 6 P.M. (Patient taking differently: Take 80 mg by mouth every evening. ) 90 tablet 0  . azaTHIOprine (IMURAN) 50 MG tablet Take 1.5 tablets (75 mg total) by mouth daily. 60 tablet 3  . carvedilol (COREG) 3.125 MG tablet Take 1 tablet (3.125 mg total) by mouth 2 (two) times daily with a meal. 180 tablet 3  . cetirizine (ZYRTEC) 10 MG tablet Take 10 mg by mouth daily.    . clorazepate (TRANXENE) 7.5 MG tablet Take 7.5 mg by mouth 3 (three) times daily.     .  famotidine (PEPCID) 20 MG tablet Take 20 mg by mouth 2 (two) times daily.     . Fluticasone-Salmeterol (ADVAIR DISKUS) 250-50 MCG/DOSE AEPB Inhale 1 puff into the lungs 2 (two) times daily. 60 each 3  . furosemide (LASIX) 40 MG tablet Take 40 mg by mouth daily as needed for fluid.     Marland Kitchen glipiZIDE (GLUCOTROL) 5 MG tablet Take 1 tablet (5 mg total) by mouth daily before breakfast. 30 tablet 3  . HYDROcodone-acetaminophen (NORCO) 10-325 MG tablet Take 1 tablet by mouth 3 (three) times daily as needed for moderate pain.     . isosorbide mononitrate (ISMO,MONOKET) 20 MG tablet Take 20 mg by mouth 2 (two) times daily at 10 AM and 5 PM.    . levothyroxine (SYNTHROID, LEVOTHROID) 50 MCG tablet Take 50 mcg by mouth daily before breakfast.     . lisinopril (PRINIVIL,ZESTRIL) 20 MG tablet Take 1 tablet (20 mg total) by mouth daily. 90 tablet 1  . metFORMIN (GLUCOPHAGE) 500 MG tablet Take 1 tablet (500 mg total) by mouth 2 (two) times daily with a meal. 60 tablet 3  . nitroGLYCERIN (NITROSTAT) 0.4 MG SL tablet Place 0.4 mg under the tongue every 5 (five) minutes as needed for chest pain.     . pantoprazole (PROTONIX) 40 MG tablet Take 40 mg by mouth every morning.     . potassium chloride SA (K-DUR,KLOR-CON) 20 MEQ tablet Take 20 mEq by mouth daily as needed (when Lasix 40mg  is taken).     . predniSONE (DELTASONE) 10 MG tablet Take 6 tablets by mouth daily x1 day; then 4 tablets by mouth daily x2 days; then 2 tablets by mouth daily x3 days; then 1 tablet by mouth daily x3 days and stop prednisone. 23 tablet 0  . senna (SENOKOT) 8.6 MG tablet Take 1 tablet by mouth at bedtime.     Marland Kitchen SPIRIVA RESPIMAT 2.5 MCG/ACT AERS Inhale 1 puff into the lungs daily.     . ticagrelor (BRILINTA) 90 MG TABS tablet Take 1 tablet (90 mg total) by mouth 2 (two) times daily. 60 tablet 11   No current facility-administered medications for this visit.      Past Surgical History:  Procedure Laterality Date  . ABDOMINAL  HYSTERECTOMY  1978  . APPENDECTOMY    . BREAST BIOPSY Left 1970s; 2015  . BREAST LUMPECTOMY Left 1970s  . BREAST LUMPECTOMY WITH NEEDLE LOCALIZATION Left 07/07/2013   Procedure: BREAST LUMPECTOMY WITH NEEDLE LOCALIZATION;  Surgeon: Merrie Roof, MD;  Location: [facility];  Service: General;  Laterality: Left;  . CARDIAC CATHETERIZATION N/A 01/22/2016   Procedure: Left Heart Cath and  Coronary Angiography;  Surgeon: Peter M Martinique, MD;  Location: [facility];  Service: Cardiovascular;  Laterality: N/A;  . CARDIAC CATHETERIZATION N/A 01/22/2016   Procedure: Coronary Stent Intervention;  Surgeon: Peter M Martinique, MD;  Location: [place] [facility];  Service: Cardiovascular;  Laterality: N/A;  . CAROTID STENT Left 04/2003   small left parietal and left hemispheric CVA/notes 07/29/2010  . CHOLECYSTECTOMY OPEN    . COLONOSCOPY  2011  . ESOPHAGOGASTRODUODENOSCOPY  08/06/2011   Procedure: ESOPHAGOGASTRODUODENOSCOPY (EGD);  Surgeon: Rogene Houston, MD;  [facility];  Service: Endoscopy;  Laterality: N/A;  . FLEXIBLE SIGMOIDOSCOPY  08/06/2011   Procedure: FLEXIBLE SIGMOIDOSCOPY;  Surgeon: Rogene Houston, MD;  [facility];  Service: Endoscopy;  Laterality: N/A;  . INGUINAL HERNIA REPAIR Left 1970s  . LARYNX SURGERY  1970s   Polyps excised  . MELANOMA EXCISION Left ~ 2016   forearm  . 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 Tape Other (See Comments)    Causes blisters to form  . Latex Rash and Other (See Comments)    Blisters      Family History  Problem Relation Age of Onset  . Heart failure Mother   . Cancer Father   . Heart failure Father   . Cancer Sister   . Dementia Sister   . Heart disease Other   . Arthritis Other   . Cancer Other   . Diabetes Other   . Kidney disease Other   . Cancer Sister   . Heart failure Brother      Social History Ms. Mooneyhan reports that she quit smoking  about 10 years ago. Her smoking use included cigarettes. She has a 40.00 pack-year smoking history. She has never used smokeless tobacco. Ms. Bleich reports no history of alcohol use.   Review of Systems CONSTITUTIONAL: No weight loss, fever, chills, weakness or fatigue.  HEENT: Eyes: No visual loss, blurred vision, double vision or yellow sclerae.No hearing loss, sneezing, congestion, runny nose or sore throat.  SKIN: No rash or itching.  CARDIOVASCULAR: per hpi RESPIRATORY: No shortness of breath, cough or sputum.  GASTROINTESTINAL: No anorexia, nausea, vomiting or diarrhea. No abdominal pain or blood.  GENITOURINARY: No burning on urination, no polyuria NEUROLOGICAL: No headache, dizziness, syncope, paralysis, ataxia, numbness or tingling in the extremities. No change in bowel or bladder control.  MUSCULOSKELETAL: No muscle, back pain, joint pain or stiffness.  LYMPHATICS: No enlarged nodes. No history of splenectomy.  PSYCHIATRIC: No history of depression or anxiety.  ENDOCRINOLOGIC: No reports of sweating, cold or heat intolerance. No polyuria or polydipsia.  Marland Kitchen   Physical Examination Today's Vitals   05/31/18 1408 05/31/18 1414  BP: 99/60 102/68  Pulse: 87   SpO2: 95%   Weight: 198 lb 3.2 oz (89.9 kg)   Height: 5\' 3"  (1.6 m)    Body mass index is 35.11 kg/m.  Gen: resting comfortably, no acute distress HEENT: no scleral icterus, pupils equal round and reactive, no palptable cervical adenopathy,  CV: RRR, no m/r/g, no jvd Resp: Clear to auscultation bilaterally GI: abdomen is soft, non-tender, non-distended, normal bowel sounds, no hepatosplenomegaly MSK: extremities are warm, 1+ biltareral LE edema Skin: warm, no rash Neuro:  no focal deficits Psych: appropriate affect   Diagnostic Studies 01/2016 cath  The left ventricular systolic function is normal.  LV end diastolic pressure is normal.  The left ventricular ejection fraction  is 55-65% by visual estimate.   Prox RCA lesion, 90 %stenosed.  1st Diag lesion, 60 %stenosed.  Prox LAD to Mid LAD lesion, 20 %stenosed.  2nd Diag lesion, 40 %stenosed.  Ost Ramus to Ramus lesion, 50 %stenosed.  1st Mrg lesion, 99 %stenosed.  A STENT SYNERGY DES 2.75X20 drug eluting stent was successfully placed.  Post intervention, there is a 0% residual stenosis.  Ost RCA to Prox RCA lesion, 90 %stenosed.  Post intervention, there is a 90% residual stenosis with dissection from the proximal to mid RCA.  1. Severe 2 vessel obstructive CAD 2. Normal LV function and LV EDP 3. Successful stenting of the first OM with DES. This appears to be the culprit vessel. 4. Unsuccessful PCI of the RCA due to wire induced dissection of the vessel.   Plan: DAPT for one year. Aggressive BP control. Will continue IV Ntg overnight. Despite RCA dissection patient is pain free, hemodynamically stable and has a normal Ecg. If her symptoms remain stable I would treat medically and allow the RCA to heal. If she continues to have angina attempt at PCI of the RCA could be considered once the vessel has had time to heal - 6-8 weeks.    01/2016 echo Study Conclusions  - Left ventricle: The cavity size was normal. Systolic function was vigorous. The estimated ejection fraction was in the range of 65% to 70%. Wall motion was normal; there were no regional wall motion abnormalities. There was an increased relative contribution of atrial contraction to ventricular filling. Doppler parameters are consistent with abnormal left ventricular relaxation (grade 1 diastolic dysfunction). Doppler parameters are consistent with high ventricular filling pressure. - Tricuspid valve: There was trivial regurgitation. - Pulmonary arteries: PA peak pressure: 32 mm Hg (S). - Pericardium, extracardiac: A trivial, free-flowing pericardial effusion was identified along the right ventricular free wall. The fluid had no internal  echoes.      Assessment and Plan  1. CAD - recent chest pain symptoms resolved with imdur.  - Likely related to her RCA disease, prior attempt at intervention aborted due to dissection -doing well without symptoms, continue current meds, we will stop brillinta.    2. Chronic diastolic HF -some recent edema, she has been on prednisone recently - continue her prn lasix, she is taking more frequently with recent swelling which is improving.    3. HTN -bp at goal, continue current meds  4. Hyperlipidemia - request labs from pcp, continue statin      Arnoldo Lenis, M.D

## 2018-05-31 NOTE — Patient Instructions (Signed)

## 2018-06-28 ENCOUNTER — Other Ambulatory Visit (INDEPENDENT_AMBULATORY_CARE_PROVIDER_SITE_OTHER): Payer: Self-pay | Admitting: Internal Medicine

## 2018-06-28 DIAGNOSIS — K754 Autoimmune hepatitis: Secondary | ICD-10-CM

## 2018-06-29 ENCOUNTER — Institutional Professional Consult (permissible substitution): Payer: Medicare Other | Admitting: Pulmonary Disease

## 2018-07-01 ENCOUNTER — Other Ambulatory Visit: Payer: Self-pay | Admitting: Cardiology

## 2018-07-04 ENCOUNTER — Ambulatory Visit (INDEPENDENT_AMBULATORY_CARE_PROVIDER_SITE_OTHER): Payer: Medicare Other | Admitting: Internal Medicine

## 2018-07-18 ENCOUNTER — Other Ambulatory Visit: Payer: Self-pay | Admitting: Cardiology

## 2018-08-01 ENCOUNTER — Ambulatory Visit (INDEPENDENT_AMBULATORY_CARE_PROVIDER_SITE_OTHER): Payer: Medicare Other | Admitting: Otolaryngology

## 2018-08-25 ENCOUNTER — Ambulatory Visit (INDEPENDENT_AMBULATORY_CARE_PROVIDER_SITE_OTHER): Payer: Medicare Other | Admitting: Otolaryngology

## 2018-08-25 DIAGNOSIS — K219 Gastro-esophageal reflux disease without esophagitis: Secondary | ICD-10-CM

## 2018-08-25 DIAGNOSIS — R1312 Dysphagia, oropharyngeal phase: Secondary | ICD-10-CM | POA: Diagnosis not present

## 2018-08-25 DIAGNOSIS — R490 Dysphonia: Secondary | ICD-10-CM | POA: Diagnosis not present

## 2018-08-29 ENCOUNTER — Other Ambulatory Visit: Payer: Self-pay | Admitting: Otolaryngology

## 2018-08-29 ENCOUNTER — Other Ambulatory Visit (HOSPITAL_COMMUNITY): Payer: Self-pay | Admitting: Otolaryngology

## 2018-08-29 DIAGNOSIS — R1312 Dysphagia, oropharyngeal phase: Secondary | ICD-10-CM

## 2018-09-27 ENCOUNTER — Ambulatory Visit (INDEPENDENT_AMBULATORY_CARE_PROVIDER_SITE_OTHER): Payer: Medicare Other | Admitting: Internal Medicine

## 2018-09-28 ENCOUNTER — Ambulatory Visit (HOSPITAL_COMMUNITY)
Admission: RE | Admit: 2018-09-28 | Discharge: 2018-09-28 | Disposition: A | Discharge status: Home / Self Care | Payer: Medicare Other | Source: Ambulatory Visit | Attending: Otolaryngology | Admitting: Otolaryngology

## 2018-09-28 ENCOUNTER — Other Ambulatory Visit: Payer: Self-pay

## 2018-09-28 ENCOUNTER — Other Ambulatory Visit (HOSPITAL_COMMUNITY): Payer: Self-pay | Admitting: Otolaryngology

## 2018-09-28 DIAGNOSIS — R1312 Dysphagia, oropharyngeal phase: Secondary | ICD-10-CM | POA: Diagnosis present

## 2018-10-04 ENCOUNTER — Ambulatory Visit (INDEPENDENT_AMBULATORY_CARE_PROVIDER_SITE_OTHER): Payer: Medicare Other | Admitting: Internal Medicine

## 2018-10-04 VITALS — BP 160/81 | HR 75 | Temp 96.3°F | Ht 63.0 in | Wt 207.5 lb

## 2018-10-05 NOTE — Progress Notes (Signed)
err

## 2018-11-11 ENCOUNTER — Other Ambulatory Visit (HOSPITAL_COMMUNITY): Payer: Self-pay | Admitting: Orthopedic Surgery

## 2018-11-11 ENCOUNTER — Other Ambulatory Visit: Payer: Self-pay | Admitting: Otolaryngology

## 2018-11-11 ENCOUNTER — Other Ambulatory Visit: Payer: Self-pay | Admitting: Nurse Practitioner

## 2018-11-11 ENCOUNTER — Other Ambulatory Visit: Payer: Self-pay | Admitting: Orthopedic Surgery

## 2018-11-11 DIAGNOSIS — M7989 Other specified soft tissue disorders: Secondary | ICD-10-CM

## 2018-11-14 ENCOUNTER — Other Ambulatory Visit: Payer: Self-pay | Admitting: Nurse Practitioner

## 2018-11-14 ENCOUNTER — Ambulatory Visit (HOSPITAL_COMMUNITY)
Admission: RE | Admit: 2018-11-14 | Discharge: 2018-11-14 | Disposition: A | Discharge status: Home / Self Care | Payer: Medicare Other | Source: Ambulatory Visit | Attending: Nurse Practitioner | Admitting: Nurse Practitioner

## 2018-11-14 ENCOUNTER — Other Ambulatory Visit: Payer: Self-pay

## 2018-11-14 DIAGNOSIS — M7989 Other specified soft tissue disorders: Secondary | ICD-10-CM

## 2019-01-05 ENCOUNTER — Other Ambulatory Visit: Payer: Self-pay | Admitting: Cardiology

## 2019-01-10 ENCOUNTER — Other Ambulatory Visit: Payer: Self-pay | Admitting: Cardiology

## 2019-02-27 ENCOUNTER — Telehealth (INDEPENDENT_AMBULATORY_CARE_PROVIDER_SITE_OTHER): Payer: Medicare Other | Admitting: Family Medicine

## 2019-02-27 ENCOUNTER — Encounter: Payer: Self-pay | Admitting: Cardiology

## 2019-02-27 ENCOUNTER — Telehealth (INDEPENDENT_AMBULATORY_CARE_PROVIDER_SITE_OTHER): Payer: Self-pay | Admitting: Internal Medicine

## 2019-02-27 VITALS — Ht 63.0 in | Wt 200.0 lb

## 2019-02-27 DIAGNOSIS — I2510 Atherosclerotic heart disease of native coronary artery without angina pectoris: Secondary | ICD-10-CM | POA: Diagnosis not present

## 2019-02-27 DIAGNOSIS — E782 Mixed hyperlipidemia: Secondary | ICD-10-CM

## 2019-02-27 DIAGNOSIS — K754 Autoimmune hepatitis: Secondary | ICD-10-CM

## 2019-02-27 DIAGNOSIS — I110 Hypertensive heart disease with heart failure: Secondary | ICD-10-CM | POA: Diagnosis not present

## 2019-02-27 DIAGNOSIS — I10 Essential (primary) hypertension: Secondary | ICD-10-CM

## 2019-02-27 DIAGNOSIS — Z7982 Long term (current) use of aspirin: Secondary | ICD-10-CM

## 2019-02-27 DIAGNOSIS — Z79899 Other long term (current) drug therapy: Secondary | ICD-10-CM

## 2019-02-27 DIAGNOSIS — I5032 Chronic diastolic (congestive) heart failure: Secondary | ICD-10-CM | POA: Diagnosis not present

## 2019-02-27 DIAGNOSIS — I480 Paroxysmal atrial fibrillation: Secondary | ICD-10-CM

## 2019-02-27 MED ORDER — AMLODIPINE BESYLATE 5 MG PO TABS: 5.0000 mg | ORAL_TABLET | Freq: Every day | ORAL | 1 refills | Status: DC

## 2019-02-27 MED ORDER — LOSARTAN POTASSIUM 25 MG PO TABS: 25.0000 mg | ORAL_TABLET | Freq: Every day | ORAL | 1 refills | Status: DC

## 2019-02-27 NOTE — Patient Instructions (Signed)
Your physician recommends that you schedule a follow-up appointment in: [place] has recommended you make the following change in your medication:   STOP LISINOPRIL   DECREASE AMLODIPINE 5 MG DAILY   START LOSARTAN 25 MG DAILY   Thank you for choosing [facility]!!

## 2019-02-27 NOTE — Telephone Encounter (Signed)
Patient's last office visit was January 2020.  She has not had any blood work. 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 I called and left a message on patient's answering service that she needs to go to the lab as soon as possible for blood work and only then her azathioprine would be refills.

## 2019-02-27 NOTE — Progress Notes (Signed)
Virtual Visit via Telephone Note   This visit type was conducted due to national recommendations for restrictions regarding the COVID-19 Pandemic (e.g. social distancing) in an effort to limit this patient's exposure and mitigate transmission in our community.  Due to her co-morbid illnesses, this patient is at least at moderate risk for complications without adequate follow up.  This format is felt to be most appropriate for this patient at this time.  The patient did not have access to video technology/had technical difficulties with video requiring transitioning to audio format only (telephone).  All issues noted in this document were discussed and addressed.  No physical exam could be performed with this format.  Please refer to the patient's chart for her  consent to telehealth for [facility].   Date:  02/27/2019   ID:  [name], DOB 23-Dec-1950, MRN [id number]  Patient Location: Home Provider Location: Office  PCP:  Lemmie Evens, MD  Cardiologist:  Carlyle Dolly, MD  Electrophysiologist:  None   Evaluation Performed:  Follow-Up Visit  Chief Complaint: Follow-up for coronary artery disease, COPD, chronic diastolic heart failure, hypertension, bradycardia, history of CVA, hyperlipidemia  History of Present Illness:    [name] is a 68 y.o. female last encounter was Dr. Harl Bowie May 31, 2018 History of non-STEMI November 2017.  DES to OM.  Unsuccessful PCI to RCA with wire induced dissection, managed medically.  History of hypertension, PAF, complete heart block, chronic diastolic CHF, NSTEMI, COPD, hepatic cirrhosis, hypothyroidism, CVA, diabetes.  Had an admission for COPD exacerbation in February [id number]  Chronic diastolic heart failure with occasional lower extremity edema.  Takes Lasix as needed.  Patient states she has had some recent swelling in her throat with difficulty swallowing and nausea/vomiting.  This could be related to her lisinopril.   Patient states other providers cannot ascertain reason for swelling.  Patient states he has had to use sublingual nitroglycerin on 3 occasions during the last 3 months which resolved her chest pain with only 1 dose each time.  States she continues to have some lower extremity edema bilaterally.  States she had a recent unilateral lower venous Doppler study on her left leg.  However the report states the study was done on her right leg.  Study was done to rule out DVT.  The patient does not have symptoms concerning for COVID-19 infection (fever, chills, cough, or new shortness of breath).    Past Medical History:  Diagnosis Date  . Anxiety   . Arthritis    "hands, arms, back, neck, knees, fingers" (01/21/2016)  . Atrial fibrillation (HCC)    ASA daiy; came off Coumadin shortly after stroke  . Breast cancer, left breast ([place]) 1970s; 2015  . Cancer of skin of leg    BLE  . 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HCC)    left  . Chronic lower back pain   . Chronic pain   . COPD (chronic obstructive pulmonary disease) ([place])   . Coronary artery disease   . CVA (cerebral vascular accident) [facility]) pt denies right brain CVA   Right brain secondary to right internal carotid artery dissection for which 2 stents were placed;F/u cerebral angiography in 05/2008-minimal plaque; healing of previously identified left internal carotid artery dissection  . Depression   . GERD (gastroesophageal reflux disease)   . Heart murmur   . Hepatic cirrhosis ([place]) 12/20/2017  . Hepatitis   . History of blood transfusion    "related to spleen"  . History of  hiatal hernia   . History of kidney stones   . Hyperlipidemia   . Hypertension   . Hypothyroidism   . Kidney stone   . Melanoma of forearm, left ([place])   . Migraine    "a few migraines/year" (01/21/2016)  . NSTEMI (non-ST elevated myocardial infarction) ([place]) 01/21/2016   Archie Endo 01/21/2016  . Obesity   . Stroke [facility]) 04/2003   2 carotid artery stents in place; small  left parietal and left hemispheric CVA.Phillis Knack 01/21/2016  . Tobacco use disorder    50 pack years; questionably discontinued in 2010  . Wears glasses    Past Surgical History:  Procedure Laterality Date  . ABDOMINAL HYSTERECTOMY  1978  . APPENDECTOMY    . BREAST BIOPSY Left 1970s; 2015  . BREAST LUMPECTOMY Left 1970s  . BREAST LUMPECTOMY WITH NEEDLE LOCALIZATION Left 07/07/2013   Procedure: BREAST LUMPECTOMY WITH NEEDLE LOCALIZATION;  Surgeon: Merrie Roof, MD;  Location: [place];  Service: General;  Laterality: Left;  . CARDIAC CATHETERIZATION N/A 01/22/2016   Procedure: Left Heart Cath and Coronary Angiography;  Surgeon: Peter M Martinique, MD;  Location: [place] CV LAB;  Service: Cardiovascular;  Laterality: N/A;  . CARDIAC CATHETERIZATION N/A 01/22/2016   Procedure: Coronary Stent Intervention;  Surgeon: Peter M Martinique, MD;  Location: [place] [facility];  Service: Cardiovascular;  Laterality: N/A;  . CAROTID STENT Left 04/2003   small left parietal and left hemispheric CVA/notes 07/29/2010  . CHOLECYSTECTOMY OPEN    . COLONOSCOPY  2011  . ESOPHAGOGASTRODUODENOSCOPY  08/06/2011   Procedure: ESOPHAGOGASTRODUODENOSCOPY (EGD);  Surgeon: Rogene Houston, MD;  [facility];  Service: Endoscopy;  Laterality: N/A;  . FLEXIBLE SIGMOIDOSCOPY  08/06/2011   Procedure: FLEXIBLE SIGMOIDOSCOPY;  Surgeon: Rogene Houston, MD;  [facility];  Service: Endoscopy;  Laterality: N/A;  . INGUINAL HERNIA REPAIR Left 1970s  . LARYNX SURGERY  1970s   Polyps excised  . MELANOMA EXCISION Left ~ 2016   forearm  . 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   spontaneous rupture     Current Meds  Medication Sig  . albuterol (PROVENTIL HFA;VENTOLIN HFA) 108 (90 Base) MCG/ACT inhaler Inhale 2 puffs into the lungs every 6 (six) hours as needed for wheezing or shortness of breath.  Marland Kitchen albuterol (PROVENTIL) (2.5 MG/3ML) 0.083% nebulizer solution Take 2.5 mg by nebulization every 6 (six) hours  as needed for wheezing or shortness of breath.  Marland Kitchen amLODipine (NORVASC) 10 MG tablet TAKE ONE TABLET BY MOUTH ONCE DAILY.  Marland Kitchen aspirin EC 81 MG tablet Take 1 tablet (81 mg total) by mouth daily.  Marland Kitchen atorvastatin (LIPITOR) 80 MG tablet TAKE ONE TABLET BY MOUTH DAILY AT 6 P.M.  . azaTHIOprine (IMURAN) 50 MG tablet TAKE 1 & 1/2 TABLETS BY MOUTH DAILY  . carvedilol (COREG) 3.125 MG tablet TAEK 1 TABLET BY MOUTH TWICE DAILY WITH MEALS.  . cetirizine (ZYRTEC) 10 MG tablet Take 10 mg by mouth daily.  . clorazepate (TRANXENE) 7.5 MG tablet Take 7.5 mg by mouth 2 (two) times daily as needed.   . famotidine (PEPCID) 20 MG tablet Take 20 mg by mouth at bedtime.   . Fluticasone-Salmeterol (ADVAIR DISKUS) 250-50 MCG/DOSE AEPB Inhale 1 puff into the lungs 2 (two) times daily.  . furosemide (LASIX) 40 MG tablet Take 40 mg by mouth daily as needed for fluid.   Marland Kitchen glipiZIDE (GLUCOTROL) 5 MG tablet Take 1 tablet (5 mg total) by mouth daily before breakfast.  .  HYDROcodone-acetaminophen (NORCO) 10-325 MG tablet Take 1 tablet by mouth 3 (three) times daily as needed for moderate pain.   . isosorbide mononitrate (ISMO,MONOKET) 20 MG tablet Take 20 mg by mouth 2 (two) times daily at 10 AM and 5 PM.  . levothyroxine (SYNTHROID, LEVOTHROID) 50 MCG tablet Take 50 mcg by mouth daily before breakfast.   . lisinopril (ZESTRIL) 20 MG tablet TAKE 1 TABLET BY MOUTH DAILY.  . nitroGLYCERIN (NITROSTAT) 0.4 MG SL tablet Place 0.4 mg under the tongue every 5 (five) minutes as needed for chest pain.   . pantoprazole (PROTONIX) 40 MG tablet Take 40 mg by mouth every morning.   . potassium chloride SA (K-DUR,KLOR-CON) 20 MEQ tablet Take 20 mEq by mouth daily.   Marland Kitchen senna (SENOKOT) 8.6 MG tablet Take 1 tablet by mouth at bedtime.   Marland Kitchen SPIRIVA RESPIMAT 2.5 MCG/ACT AERS Inhale 1 puff into the lungs daily.   . [DISCONTINUED] metFORMIN (GLUCOPHAGE) 500 MG tablet Take 1 tablet (500 mg total) by mouth 2 (two) times daily with a meal.      Allergies:   Tape and Latex   Social History   Tobacco Use  . Smoking status: Former Smoker    Packs/day: 1.00    Years: 40.00    Pack years: 40.00    Types: Cigarettes    Quit date: 07/06/2007    Years since quitting: 11.6  . Smokeless tobacco: Never Used  Substance Use Topics  . Alcohol use: No    Alcohol/week: 0.0 standard drinks  . Drug use: No     Family Hx: The patient's family history includes Arthritis in an other family member; Cancer in her father, sister, sister, and another family member; Dementia in her sister; Diabetes in an other family member; Heart disease in an other family member; Heart failure in her brother, father, and mother; Kidney disease in an other family member.  ROS:   Please see the history of present illness.    All other systems reviewed and are negative.   Prior CV studies:   The following studies were reviewed today:  Diagnostic Studies 01/2016 cath  The left ventricular systolic function is normal.  LV end diastolic pressure is normal.  The left ventricular ejection fraction is 55-65% by visual estimate.  Prox RCA lesion, 90 %stenosed.  1st Diag lesion, 60 %stenosed.  Prox LAD to Mid LAD lesion, 20 %stenosed.  2nd Diag lesion, 40 %stenosed.  Ost Ramus to Ramus lesion, 50 %stenosed.  1st Mrg lesion, 99 %stenosed.  A STENT SYNERGY DES 2.75X20 drug eluting stent was successfully placed.  Post intervention, there is a 0% residual stenosis.  Ost RCA to Prox RCA lesion, 90 %stenosed.  Post intervention, there is a 90% residual stenosis with dissection from the proximal to mid RCA.  1. Severe 2 vessel obstructive CAD 2. Normal LV function and LV EDP 3. Successful stenting of the first OM with DES. This appears to be the culprit vessel. 4. Unsuccessful PCI of the RCA due to wire induced dissection of the vessel.   Plan: DAPT for one year. Aggressive BP control. Will continue IV Ntg overnight. Despite RCA dissection patient  is pain free, hemodynamically stable and has a normal Ecg. If her symptoms remain stable I would treat medically and allow the RCA to heal. If she continues to have angina attempt at PCI of the RCA could be considered once the vessel has had time to heal - 6-8 weeks.    01/2016 echo Study  Conclusions  - Left ventricle: The cavity size was normal. Systolic function was vigorous. The estimated ejection fraction was in the range of 65% to 70%. Wall motion was normal; there were no regional wall motion abnormalities. There was an increased relative contribution of atrial contraction to ventricular filling. Doppler parameters are consistent with abnormal left ventricular relaxation (grade 1 diastolic dysfunction). Doppler parameters are consistent with high ventricular filling pressure. - Tricuspid valve: There was trivial regurgitation. - Pulmonary arteries: PA peak pressure: 32 mm Hg (S). - Pericardium, extracardiac: A trivial, free-flowing pericardial effusion was identified along the right ventricular free wall. The fluid had no internal echoes.  Lower venous Doppler study November 14, 2018 Negative for deep vein thrombosis in right lower extremity.  Test was performed for right lower leg swelling.   Labs/Other Tests and Data Reviewed:    EKG:  An ECG dated April 19, 2018 was personally reviewed today and demonstrated:  Sinus tachycardia rate of 102.  Multiple ventricular premature complexes, probable anteroseptal infarct, old, prolonged QT interval QT and QTc respectively 385/502  Recent Labs: 04/19/2018: ALT 31; B Natriuretic Peptide 35.0 04/20/2018: BUN 11; Creatinine, Ser 0.44; Hemoglobin 13.1; Magnesium 1.6; Platelets 322; Potassium 4.1; Sodium 136   Recent Lipid Panel Lab Results  Component Value Date/Time   CHOL 119 03/27/2016 11:34 AM   TRIG 99 03/27/2016 11:34 AM   HDL 35 (L) 03/27/2016 11:34 AM   CHOLHDL 3.4 03/27/2016 11:34 AM   LDLCALC 64 03/27/2016  11:34 AM    Wt Readings from Last 3 Encounters:  02/27/19 200 lb (90.7 kg)  05/31/18 198 lb 3.2 oz (89.9 kg)  04/19/18 197 lb (89.4 kg)     Objective:    Vital Signs:  Ht 5\' 3"  (1.6 m)   Wt 200 lb (90.7 kg)   BMI 35.43 kg/m    Patient has normal speech pattern and speaking in complete sentences and responding appropriately.  No evidence of cough, shortness of breath, or dyspnea noted.  ASSESSMENT & PLAN:    1. Coronary artery disease involving native coronary artery of native heart without angina pectoris Patient states in the last 3 months she has had to use sublingual nitroglycerin on 3 occasions.  States on each occasion 1 sublingual nitro relieved her chest pain/arm pain.  Denies any other episodes.  Decrease amlodipine to 5 mg.  Continue aspirin 81 mg daily.,  Lipitor 80 mg daily. 2. Chronic diastolic CHF (congestive heart failure) ([place]) Last echocardiogram in 2017 showed EF of 65 to 70% and grade 1 diastolic dysfunction.  Trivial tricuspid regurgitation.  States he continues with lower extremity edema.  Advised we will decrease dose of amlodipine to 5 mg to see if edema improves.  May need a lower venous Doppler for possible venous insufficiency or repeat echocardiogram in the future.  3. Essential hypertension Patient states her blood pressure is within normal limits at home.  Start losartan 25 mg daily.  Lisinopril discontinued for suspicion of angioedema in her throat due to recent complaints of throat swelling with no other explanation.  Patient has been evaluated by providers who cannot explain the reason for throat swelling per her statement  4. Mixed hyperlipidemia Continues on Lipitor 80 mg.  Patient states she recently had lab work at her PCP.  We will attempt to obtain lab results.  COVID-19 Education: The signs and symptoms of COVID-19 were discussed with the patient and how to seek care for testing (follow up with PCP or arrange E-visit).  The  importance of social  distancing was discussed today.  Time:   Today, I have spent 15 minutes with the patient with telehealth technology discussing the above problems.     Medication Adjustments/Labs and Tests Ordered: Current medicines are reviewed at length with the patient today.  Concerns regarding medicines are outlined above.   Tests Ordered: No orders of the defined types were placed in this encounter.   Medication Changes: No orders of the defined types were placed in this encounter.   Follow Up:  Either In Person or Virtual in 6 month(s)  Signed, Verta Ellen, NP  02/27/2019 12:12 PM    [place]

## 2019-03-03 ENCOUNTER — Ambulatory Visit (INDEPENDENT_AMBULATORY_CARE_PROVIDER_SITE_OTHER): Payer: Medicare Other | Admitting: Orthopedic Surgery

## 2019-03-03 ENCOUNTER — Encounter: Payer: Self-pay | Admitting: Orthopedic Surgery

## 2019-03-03 ENCOUNTER — Other Ambulatory Visit: Payer: Self-pay

## 2019-03-03 ENCOUNTER — Ambulatory Visit: Payer: Medicare Other

## 2019-03-03 VITALS — BP 139/85 | HR 96 | Ht 63.0 in | Wt 207.0 lb

## 2019-03-03 DIAGNOSIS — M79642 Pain in left hand: Secondary | ICD-10-CM | POA: Diagnosis not present

## 2019-03-03 DIAGNOSIS — M79641 Pain in right hand: Secondary | ICD-10-CM

## 2019-03-03 DIAGNOSIS — G5603 Carpal tunnel syndrome, bilateral upper limbs: Secondary | ICD-10-CM | POA: Diagnosis not present

## 2019-03-03 MED ORDER — GABAPENTIN 100 MG PO CAPS: 100.0000 mg | ORAL_CAPSULE | Freq: Three times a day (TID) | ORAL | 2 refills | Status: DC

## 2019-03-03 NOTE — Progress Notes (Signed)
[name]  03/03/2019  Body mass index is 36.67 kg/m.   HISTORY SECTION :  Chief Complaint  Patient presents with  . Hand Pain    both hands swollen painful can't move them in the am.    68 year old female referred by Dr. Karie Kirks with bilateral hand pain bilateral hand numbness bilateral hand tingling and bilateral hand weakness for over a month no prior treatment    Review of Systems  Constitutional: Positive for diaphoresis and malaise/fatigue.  Respiratory: Positive for shortness of breath and wheezing.   Cardiovascular: Positive for chest pain, leg swelling and PND.  Gastrointestinal: Positive for heartburn.  Musculoskeletal: Positive for back pain, joint pain, myalgias and neck pain.  Neurological: Positive for weakness.  Endo/Heme/Allergies: Positive for environmental allergies. Bruises/bleeds easily.  All other systems reviewed and are negative.    has a past medical history of Anxiety, Arthritis, Atrial fibrillation ([place]), Breast cancer, left breast ([place]) (1970s; 2015), Cancer of skin of leg, 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HCC), Chronic lower back pain, Chronic pain, COPD (chronic obstructive pulmonary disease) ([place]), Coronary artery disease, CVA (cerebral vascular accident) ([place]) (pt denies right brain CVA), Depression, GERD (gastroesophageal reflux disease), Heart murmur, Hepatic cirrhosis ([place]) (12/20/2017), Hepatitis, History of blood transfusion, History of hiatal hernia, History of kidney stones, Hyperlipidemia, Hypertension, Hypothyroidism, Kidney stone, Melanoma of forearm, left ([place]), Migraine, NSTEMI (non-ST elevated myocardial infarction) ([place]) (01/21/2016), Obesity, Stroke ([place]) (04/2003), Tobacco use disorder, and Wears glasses.   Past Surgical History:  Procedure Laterality Date  . ABDOMINAL HYSTERECTOMY  1978  . APPENDECTOMY    . BREAST BIOPSY Left 1970s; 2015  . BREAST LUMPECTOMY Left 1970s  . BREAST LUMPECTOMY WITH NEEDLE LOCALIZATION Left 07/07/2013    Procedure: BREAST LUMPECTOMY WITH NEEDLE LOCALIZATION;  Surgeon: Merrie Roof, MD;  Location: [place];  Service: General;  Laterality: Left;  . CARDIAC CATHETERIZATION N/A 01/22/2016   Procedure: Left Heart Cath and Coronary Angiography;  Surgeon: Peter M Martinique, MD;  Location: [place] [facility];  Service: Cardiovascular;  Laterality: N/A;  . CARDIAC CATHETERIZATION N/A 01/22/2016   Procedure: Coronary Stent Intervention;  Surgeon: Peter M Martinique, MD;  Location: [place] [facility];  Service: Cardiovascular;  Laterality: N/A;  . CAROTID STENT Left 04/2003   small left parietal and left hemispheric CVA/notes 07/29/2010  . CHOLECYSTECTOMY OPEN    . COLONOSCOPY  2011  . ESOPHAGOGASTRODUODENOSCOPY  08/06/2011   Procedure: ESOPHAGOGASTRODUODENOSCOPY (EGD);  Surgeon: Rogene Houston, MD;  [facility];  Service: Endoscopy;  Laterality: N/A;  . FLEXIBLE SIGMOIDOSCOPY  08/06/2011   Procedure: FLEXIBLE SIGMOIDOSCOPY;  Surgeon: Rogene Houston, MD;  [facility];  Service: Endoscopy;  Laterality: N/A;  . INGUINAL HERNIA REPAIR Left 1970s  . LARYNX SURGERY  1970s   Polyps excised  . MELANOMA EXCISION Left ~ 2016   forearm  . 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   spontaneous rupture    Body mass index is 36.67 kg/m.   Allergies  Allergen Reactions  . Tape Other (See Comments)    Causes blisters to form  . Latex Rash and Other (See Comments)    Blisters     Current Outpatient Medications:  .  albuterol (PROVENTIL HFA;VENTOLIN HFA) 108 (90 Base) MCG/ACT inhaler, Inhale 2 puffs into the lungs every 6 (six) hours as needed for wheezing or shortness of breath., Disp: , Rfl:  .  albuterol (PROVENTIL) (2.5 MG/3ML) 0.083% nebulizer solution, Take 2.5 mg by nebulization every 6 (six) hours  as needed for wheezing or shortness of breath., Disp: , Rfl:  .  amLODipine (NORVASC) 5 MG tablet, Take 1 tablet (5 mg total) by mouth daily., Disp: 90 tablet, Rfl: 1 .  aspirin EC  81 MG tablet, Take 1 tablet (81 mg total) by mouth daily., Disp: , Rfl:  .  atorvastatin (LIPITOR) 80 MG tablet, TAKE ONE TABLET BY MOUTH DAILY AT 6 P.M., Disp: 90 tablet, Rfl: 3 .  azaTHIOprine (IMURAN) 50 MG tablet, TAKE 1 & 1/2 TABLETS BY MOUTH DAILY, Disp: 60 tablet, Rfl: 3 .  carvedilol (COREG) 3.125 MG tablet, TAEK 1 TABLET BY MOUTH TWICE DAILY WITH MEALS., Disp: 180 tablet, Rfl: 3 .  cetirizine (ZYRTEC) 10 MG tablet, Take 10 mg by mouth daily., Disp: , Rfl:  .  clorazepate (TRANXENE) 7.5 MG tablet, Take 7.5 mg by mouth 2 (two) times daily as needed. , Disp: , Rfl:  .  famotidine (PEPCID) 20 MG tablet, Take 20 mg by mouth at bedtime. , Disp: , Rfl:  .  Fluticasone-Salmeterol (ADVAIR DISKUS) 250-50 MCG/DOSE AEPB, Inhale 1 puff into the lungs 2 (two) times daily., Disp: 60 each, Rfl: 3 .  furosemide (LASIX) 40 MG tablet, Take 40 mg by mouth daily as needed for fluid. , Disp: , Rfl:  .  glipiZIDE (GLUCOTROL) 5 MG tablet, Take 1 tablet (5 mg total) by mouth daily before breakfast., Disp: 30 tablet, Rfl: 3 .  HYDROcodone-acetaminophen (NORCO) 10-325 MG tablet, Take 1 tablet by mouth 3 (three) times daily as needed for moderate pain. , Disp: , Rfl:  .  isosorbide mononitrate (ISMO,MONOKET) 20 MG tablet, Take 20 mg by mouth 2 (two) times daily at 10 AM and 5 PM., Disp: , Rfl:  .  levothyroxine (SYNTHROID, LEVOTHROID) 50 MCG tablet, Take 50 mcg by mouth daily before breakfast. , Disp: , Rfl:  .  losartan (COZAAR) 25 MG tablet, Take 1 tablet (25 mg total) by mouth daily., Disp: 90 tablet, Rfl: 1 .  nitroGLYCERIN (NITROSTAT) 0.4 MG SL tablet, Place 0.4 mg under the tongue every 5 (five) minutes as needed for chest pain. , Disp: , Rfl:  .  pantoprazole (PROTONIX) 40 MG tablet, Take 40 mg by mouth every morning. , Disp: , Rfl:  .  potassium chloride SA (K-DUR,KLOR-CON) 20 MEQ tablet, Take 20 mEq by mouth daily. , Disp: , Rfl:  .  senna (SENOKOT) 8.6 MG tablet, Take 1 tablet by mouth at bedtime. , Disp: ,  Rfl:  .  SPIRIVA RESPIMAT 2.5 MCG/ACT AERS, Inhale 1 puff into the lungs daily. , Disp: , Rfl:  .  gabapentin (NEURONTIN) 100 MG capsule, Take 1 capsule (100 mg total) by mouth 3 (three) times daily., Disp: 90 capsule, Rfl: 2   PHYSICAL EXAM SECTION: 1) BP 139/85   Pulse 96   Ht 5\' 3"  (1.6 m)   Wt 207 lb (93.9 kg)   BMI 36.67 kg/m   Body mass index is 36.67 kg/m. General appearance: Well-developed well-nourished no gross deformities  2) Cardiovascular normal pulse and perfusion in the upper extremities normal color without edema  3) Neurologically deep tendon reflexes are equal and normal, no sensation loss or deficits no pathologic reflexes  4) Psychological: Awake alert and oriented x3 mood and affect normal  5) Skin no lacerations or ulcerations no nodularity no palpable masses, no erythema or nodularity  6) Musculoskeletal:   She has weak grip strength She has numbness and tingling in both hands in the median nerve distribution She has  weakness in both upper extremities with grip She has tenderness over the carpal tunnel bilaterally Her range of motion in each wrist and hand remains normal   MEDICAL DECISION SECTION:  Encounter Diagnoses  Name Primary?  . Bilateral hand pain   . Bilateral carpal tunnel syndrome Yes    Imaging X-rays of both hands were obtained there was no fracture dislocation or degenerative arthritis  Plan:  (Rx., Inj., surg., Frx, MRI/CT, XR:2) Meds ordered this encounter  Medications  . gabapentin (NEURONTIN) 100 MG capsule    Sig: Take 1 capsule (100 mg total) by mouth 3 (three) times daily.    Dispense:  90 capsule    Refill:  2    Recommend bilateral wrist splints for 6 weeks Gabapentin for 6 weeks Recheck in 6 weeks   12:07 PM Arther Abbott, MD  03/03/2019

## 2019-03-03 NOTE — Patient Instructions (Addendum)
Please try to be 15 to 20 minutes early for your appointments to avoid rescheduling.  Wear braces for 6 weeks  Take 100 mg of neurontin at night

## 2019-03-06 ENCOUNTER — Telehealth: Payer: Self-pay | Admitting: Orthopedic Surgery

## 2019-03-06 NOTE — Telephone Encounter (Signed)
Patient called this morning stating that she could not tolerate the medication (gabapentin) that Dr. Aline Brochure gave her.  She said it drained her and made her sick.  She said she took it twice to make sure that it was the medicine.  She wanted to know if there was something else he could send in for her.  She states she uses [facility]

## 2019-03-06 NOTE — Telephone Encounter (Signed)
I called her to advise, she has voiced understanding.

## 2019-03-06 NOTE — Telephone Encounter (Signed)
Stop taking it   Wear braces take tylenol or advil

## 2019-03-22 ENCOUNTER — Encounter (INDEPENDENT_AMBULATORY_CARE_PROVIDER_SITE_OTHER): Payer: Self-pay | Admitting: Internal Medicine

## 2019-03-22 ENCOUNTER — Other Ambulatory Visit: Payer: Self-pay

## 2019-03-22 ENCOUNTER — Ambulatory Visit (INDEPENDENT_AMBULATORY_CARE_PROVIDER_SITE_OTHER): Payer: Medicare Other | Admitting: Gastroenterology

## 2019-03-22 VITALS — BP 160/81 | HR 75 | Temp 96.3°F | Ht 63.0 in | Wt 207.5 lb

## 2019-03-22 DIAGNOSIS — Z800 Family history of malignant neoplasm of digestive organs: Secondary | ICD-10-CM | POA: Diagnosis not present

## 2019-03-22 DIAGNOSIS — K754 Autoimmune hepatitis: Secondary | ICD-10-CM | POA: Diagnosis not present

## 2019-03-22 DIAGNOSIS — R1310 Dysphagia, unspecified: Secondary | ICD-10-CM | POA: Diagnosis not present

## 2019-03-22 DIAGNOSIS — K219 Gastro-esophageal reflux disease without esophagitis: Secondary | ICD-10-CM | POA: Diagnosis not present

## 2019-03-22 NOTE — Progress Notes (Signed)
Patient profile: [name] is a 69 y.o. 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 Last seen in clinic in 03/2018.   History of Present Illness: [name] is seen today accompanied by caregiver who helps with her appointments.  Reports she was diagnosed with cirrhosis in October 2019-work-up to determine cause revealed a positive ANA at 1:1280, mitochondrial antibody 23, negative viral hepatitis panel, normal ferritin. Ultimately had a liver bx with path as below.  She was started on prednisone taper as well as 75 mg of azathioprine. Completed prednisone and has continued the azathioprine.   She reports overall feeling fairly well from a GI standpoint.  She does have some GERD symptoms despite a PPI as well as some dysphagia.  She takes Protonix 40 mg once a day.  Feels most regurgitation when she lays flat although she reports she remains upright 2 to 3 hours after eating.  Dysphagia seems to be mainly in upper esophageal area, most prominent with pills but can also occur with solids or liquids.  She denies any odynophagia.  No nausea vomiting.  Appetite good.  Typically having a bowel movement daily or every other day, uses senna very occasionally as needed.  Denies any abdominal pain, rectal bleeding, melena.  No significant lower GI complaints.  She reports since starting her azathioprine she has experienced some fatigue and hair loss and wonders if related. Also has thyroid issues.   Wt Readings from Last 3 Encounters:  03/22/19 207 lb 8 oz (94.1 kg)  03/03/19 207 lb (93.9 kg)  02/27/19 200 lb (90.7 kg)     Last Colonoscopy: Per patient over 10 years ago, results not available in epic.   Last Endoscopy: 2013-Impression: 3 cm long Mallory-Weiss tear without stigmata of active bleeding. Erosive reflux esophagitis with small sliding hiatal hernia. Large piece of formed stool at the ascending colon with ulceration to mucosa. Biopsy taken. Suspect turgor ulcers  secondary to obstipation rather than ischemic colitis.    Past Medical History:  Past Medical History:  Diagnosis Date   Anxiety    Arthritis    "hands, arms, back, neck, knees, fingers" (01/21/2016)   Atrial fibrillation (HCC)    ASA daiy; came off Coumadin shortly after stroke   Breast cancer, left breast ([place]) 1970s; 2015   Cancer of skin of leg    BLE   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HCC)    left   Chronic lower back pain    Chronic pain    COPD (chronic obstructive pulmonary disease) (HCC)    Coronary artery disease    CVA (cerebral vascular accident) ([place]) pt denies right brain CVA   Right brain secondary to right internal carotid artery dissection for which 2 stents were placed;F/u cerebral angiography in 05/2008-minimal plaque; healing of previously identified left internal carotid artery dissection   Depression    GERD (gastroesophageal reflux disease)    Heart murmur    Hepatic cirrhosis ([place]) 12/20/2017   Hepatitis    History of blood transfusion    "related to spleen"   History of hiatal hernia    History of kidney stones    Hyperlipidemia    Hypertension    Hypothyroidism    Kidney stone    Melanoma of forearm, left ([place])    Migraine    "a few migraines/year" (01/21/2016)   NSTEMI (non-ST elevated myocardial infarction) ([place]) 01/21/2016   Archie Endo 01/21/2016   Obesity    Stroke ([place]) 04/2003   2 carotid artery  stents in place; small left parietal and left hemispheric CVA.Phillis Knack 01/21/2016   Tobacco use disorder    50 pack years; questionably discontinued in 2010   Wears glasses     Problem List: Patient Active Problem List   Diagnosis Date Noted   Type 2 diabetes mellitus with hyperglycemia, without long-term current use of insulin ([place])    COPD with acute exacerbation ([place]) 04/19/2018   Hepatic cirrhosis ([place]) 12/20/2017   Bradycardia 08/21/2016   NSTEMI (non-ST elevation myocardial infarction) ([place]) 01/21/2016   Chronic  diastolic CHF (congestive heart failure) ([place]) 01/01/2016   Symptomatic bradycardia 09/30/2015   CVA (cerebral infarction) 09/30/2015   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/30/2015   Essential hypertension 09/30/2015   Chronic pain 09/30/2015   Hyperlipidemia 09/30/2015   Surgical menopause on hormone replacement therapy 09/30/2015   GERD (gastroesophageal reflux disease) 09/30/2015   Paroxysmal atrial fibrillation ([place]) 09/30/2015   Hypothyroidism 09/30/2015   Complete heart block ([place]) 09/30/2015   Breast mass 04/27/2013   Deltoid tendinitis of left shoulder 11/23/2012   Rotator cuff tear 10/06/2012   Rotator cuff syndrome of left shoulder 10/06/2012   Pain in joint, shoulder region 10/06/2012   Tendonitis, calcific, shoulder 07/26/2012   CVA (cerebral infarction)    COPD (chronic obstructive pulmonary disease) ([place])    Hypertension    Hyperlipidemia    Obesity    Chest tightness    Tobacco use disorder     Past Surgical History: Past Surgical History:  Procedure Laterality Date   ABDOMINAL HYSTERECTOMY  1978   APPENDECTOMY     BREAST BIOPSY Left 1970s; 2015   BREAST LUMPECTOMY Left 1970s   BREAST LUMPECTOMY WITH NEEDLE LOCALIZATION Left 07/07/2013   Procedure: BREAST LUMPECTOMY WITH NEEDLE LOCALIZATION;  Surgeon: Merrie Roof, MD;  Location: [facility];  Service: General;  Laterality: Left;   CARDIAC CATHETERIZATION N/A 01/22/2016   Procedure: Left Heart Cath and Coronary Angiography;  Surgeon: Peter M Martinique, MD;  Location: [facility];  Service: Cardiovascular;  Laterality: N/A;   CARDIAC CATHETERIZATION N/A 01/22/2016   Procedure: Coronary Stent Intervention;  Surgeon: Peter M Martinique, MD;  Location: [facility];  Service: Cardiovascular;  Laterality: N/A;   CAROTID STENT Left 04/2003   small left parietal and left hemispheric CVA/notes 07/29/2010   CHOLECYSTECTOMY OPEN     COLONOSCOPY  2011   ESOPHAGOGASTRODUODENOSCOPY   08/06/2011   Procedure: ESOPHAGOGASTRODUODENOSCOPY (EGD);  Surgeon: Rogene Houston, MD;  [facility];  Service: Endoscopy;  Laterality: N/A;   FLEXIBLE SIGMOIDOSCOPY  08/06/2011   Procedure: FLEXIBLE SIGMOIDOSCOPY;  Surgeon: Rogene Houston, MD;  [facility];  Service: Endoscopy;  Laterality: N/A;   INGUINAL HERNIA REPAIR Left 1970s   LARYNX SURGERY  1970s   Polyps excised   MELANOMA EXCISION Left ~ 2016   forearm   SPLENECTOMY, 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   spontaneous rupture    Allergies: Allergies  Allergen Reactions   Gabapentin Nausea And Vomiting   Prednisone Palpitations   Tape Other (See Comments)    Causes blisters to form   Latex Rash and Other (See Comments)    Blisters      Home Medications:  Current Outpatient Medications:    albuterol (PROVENTIL HFA;VENTOLIN HFA) 108 (90 Base) MCG/ACT inhaler, Inhale 2 puffs into the lungs every 6 (six) hours as needed for wheezing or shortness of breath., Disp: , Rfl:    albuterol (PROVENTIL) (2.5 MG/3ML) 0.083% nebulizer solution, Take 2.5 mg by  nebulization every 6 (six) hours as needed for wheezing or shortness of breath., Disp: , Rfl:    amLODipine (NORVASC) 5 MG tablet, Take 1 tablet (5 mg total) by mouth daily., Disp: 90 tablet, Rfl: 1   aspirin EC 81 MG tablet, Take 1 tablet (81 mg total) by mouth daily., Disp: , Rfl:    atorvastatin (LIPITOR) 80 MG tablet, TAKE ONE TABLET BY MOUTH DAILY AT 6 P.M., Disp: 90 tablet, Rfl: 3   azaTHIOprine (IMURAN) 50 MG tablet, TAKE 1 & 1/2 TABLETS BY MOUTH DAILY, Disp: 60 tablet, Rfl: 3   carvedilol (COREG) 3.125 MG tablet, TAEK 1 TABLET BY MOUTH TWICE DAILY WITH MEALS., Disp: 180 tablet, Rfl: 3   cetirizine (ZYRTEC) 10 MG tablet, Take 10 mg by mouth daily., Disp: , Rfl:    clorazepate (TRANXENE) 7.5 MG tablet, Take 7.5 mg by mouth 2 (two) times daily as needed. , Disp: , Rfl:    famotidine (PEPCID) 20 MG tablet, Take 20 mg by mouth at bedtime. , Disp: , Rfl:     Fluticasone-Salmeterol (ADVAIR DISKUS) 250-50 MCG/DOSE AEPB, Inhale 1 puff into the lungs 2 (two) times daily., Disp: 60 each, Rfl: 3   furosemide (LASIX) 40 MG tablet, Take 40 mg by mouth daily as needed for fluid. , Disp: , Rfl:    glipiZIDE (GLUCOTROL) 5 MG tablet, Take 1 tablet (5 mg total) by mouth daily before breakfast., Disp: 30 tablet, Rfl: 3   HYDROcodone-acetaminophen (NORCO) 10-325 MG tablet, Take 1 tablet by mouth 3 (three) times daily as needed for moderate pain. , Disp: , Rfl:    isosorbide mononitrate (ISMO,MONOKET) 20 MG tablet, Take 20 mg by mouth 2 (two) times daily at 10 AM and 5 PM., Disp: , Rfl:    levothyroxine (SYNTHROID, LEVOTHROID) 50 MCG tablet, Take 50 mcg by mouth daily before breakfast. , Disp: , Rfl:    losartan (COZAAR) 25 MG tablet, Take 1 tablet (25 mg total) by mouth daily., Disp: 90 tablet, Rfl: 1   nitroGLYCERIN (NITROSTAT) 0.4 MG SL tablet, Place 0.4 mg under the tongue every 5 (five) minutes as needed for chest pain. , Disp: , Rfl:    pantoprazole (PROTONIX) 40 MG tablet, Take 40 mg by mouth every morning. , Disp: , Rfl:    potassium chloride SA (K-DUR,KLOR-CON) 20 MEQ tablet, Take 20 mEq by mouth daily. , Disp: , Rfl:    senna (SENOKOT) 8.6 MG tablet, Take 1 tablet by mouth at bedtime. , Disp: , Rfl:    SPIRIVA RESPIMAT 2.5 MCG/ACT AERS, Inhale 1 puff into the lungs daily. , Disp: , Rfl:    Family History: family history includes Arthritis in an other family member; Cancer in her father, sister, sister, and another family member; Dementia in her sister; Diabetes in an other family member; Heart disease in an other family member; Heart failure in her brother, father, and mother; Kidney disease in an other family member.   *Reports father had colon cancer in his early 5s.  Social History:   reports that she quit smoking about 11 years ago. Her smoking use included cigarettes. She has a 40.00 pack-year smoking history. She has never used smokeless  tobacco. She reports that she does not drink alcohol or use drugs.   Review of Systems: Constitutional: Denies weight loss/weight gain  Eyes: No changes in vision. ENT: No oral lesions, sore throat.  GI: see HPI.  Heme/Lymph: No easy bruising.  CV: No chest pain.  GU: No hematuria.  Integumentary:  No rashes.  Neuro: No headaches.  Psych: No depression/anxiety.  Endocrine: No heat/cold intolerance.  Allergic/Immunologic: No urticaria.  Resp: No cough, SOB.  Musculoskeletal: No joint swelling.    Physical Examination: BP (!) 160/81    Pulse 75    Temp (!) 96.3 F (35.7 C)    Ht 5\' 3"  (1.6 m)    Wt 207 lb 8 oz (94.1 kg)    BMI 36.76 kg/m  Gen: NAD, alert and oriented x 4 HEENT: PEERLA, EOMI, Neck: supple, no JVD Chest: CTA bilaterally, no wheezes, crackles, or other adventitious sounds CV: RRR, no m/g/c/r Abd: soft, NT, ND, +BS in all four quadrants; no HSM, guarding, ridigity, or rebound tenderness Ext: no edema, well perfused with 2+ pulses, Skin: no rash or lesions noted on observed skin Lymph: no noted LAD  Data Reviewed:  February 2020-CMP with normal LFTs, glucose 285, CBC normal  Barium swallow-09/2018-IMPRESSION: Small sliding-type hiatal hernia. No other significant abnormality seen in the esophagus.  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  Assessment/Plan: Ms. Vielma is a 69 y.o. female    Diagnoses and all orders for this visit:  Gastroesophageal reflux disease, unspecified whether esophagitis present -     Procedural/ Surgical Case Request: COLONOSCOPY, ESOPHAGOGASTRODUODENOSCOPY (EGD); Standing -     Procedural/ Surgical Case Request: COLONOSCOPY, ESOPHAGOGASTRODUODENOSCOPY (EGD)  Family history of colon cancer -     Procedural/ Surgical Case Request: COLONOSCOPY, ESOPHAGOGASTRODUODENOSCOPY (EGD); Standing -     Procedural/ Surgical Case  Request: COLONOSCOPY, ESOPHAGOGASTRODUODENOSCOPY (EGD)  Dysphagia, unspecified type -     Procedural/ Surgical Case Request: COLONOSCOPY, ESOPHAGOGASTRODUODENOSCOPY (EGD); Standing -     Procedural/ Surgical Case Request: COLONOSCOPY, ESOPHAGOGASTRODUODENOSCOPY (EGD)  Autoimmune hepatitis ([place]) -     CMP -     CBC with Differential -     INR/PT -     Mitochondrial/smooth muscle ab pnl -     ANA, IFA Comprehensive Panel  Other orders -     ANA, IFA Comprehensive Panel -     Protime-INR      1.  GERD/dysphagia-symptomatic despite PPI.  She has some dysphagia and last endoscopy approximately 7 years.  We will arrange for EGD with possible dilation.  Reviewed small bites chewing thoroughly and diet modifications.  2.  Family history of colon cancer-father-last colonoscopy was over 10 years ago per patient.  Due for repeat and will arrange.  No lower GI symptoms.  3.  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  We will check basic labs as above as well as repeat AMA.  She has not had any abdominal imaging since her liver biopsy at time of diagnosis 2018-will plan for fibroscan, if shows F4 fibrosis will need liver Bx. Consider immunglobulin panel in future. Given overlap syndrome recommend Urso 1g/day.  Patient denies CP, SOB, and use of blood thinners. I discussed the risks and benefits of procedure including bleeding, perforation, infection, missed lesions, medication reactions and possible hospitalization or surgery if complications. All questions answered. Per patient remote hx of MI but no longer on anticoag therapy   *Case discussed in detail w/ Dr Laural Golden   I personally performed the service, non-incident to. (WP)  Laurine Blazer, [facility]

## 2019-03-22 NOTE — Patient Instructions (Signed)
We are scheduling you for an endoscopy and colonoscopy - please call w/ any health changes prior.   We are checking labs today

## 2019-03-23 ENCOUNTER — Encounter (INDEPENDENT_AMBULATORY_CARE_PROVIDER_SITE_OTHER): Payer: Self-pay | Admitting: *Deleted

## 2019-03-23 ENCOUNTER — Telehealth (INDEPENDENT_AMBULATORY_CARE_PROVIDER_SITE_OTHER): Payer: Self-pay | Admitting: *Deleted

## 2019-03-23 DIAGNOSIS — R1310 Dysphagia, unspecified: Secondary | ICD-10-CM | POA: Insufficient documentation

## 2019-03-23 DIAGNOSIS — K219 Gastro-esophageal reflux disease without esophagitis: Secondary | ICD-10-CM | POA: Insufficient documentation

## 2019-03-23 DIAGNOSIS — Z800 Family history of malignant neoplasm of digestive organs: Secondary | ICD-10-CM | POA: Insufficient documentation

## 2019-03-23 DIAGNOSIS — R1319 Other dysphagia: Secondary | ICD-10-CM | POA: Insufficient documentation

## 2019-03-23 NOTE — Telephone Encounter (Signed)
Patient needs suprep TCS/EGD sch;d 2/24

## 2019-03-24 MED ORDER — SUPREP BOWEL PREP KIT 17.5-3.13-1.6 GM/177ML PO SOLN: 1.0000 | Freq: Once | ORAL | 0 refills | Status: AC

## 2019-03-24 NOTE — Telephone Encounter (Signed)
Refill sent to pharmacy.   

## 2019-03-25 ENCOUNTER — Encounter (INDEPENDENT_AMBULATORY_CARE_PROVIDER_SITE_OTHER): Payer: Self-pay | Admitting: Gastroenterology

## 2019-03-28 LAB — HM DIABETES EYE EXAM

## 2019-03-29 ENCOUNTER — Telehealth (INDEPENDENT_AMBULATORY_CARE_PROVIDER_SITE_OTHER): Payer: Self-pay | Admitting: *Deleted

## 2019-03-29 DIAGNOSIS — K754 Autoimmune hepatitis: Secondary | ICD-10-CM

## 2019-03-29 LAB — COMPLETE METABOLIC PANEL WITH GFR
AG Ratio: 1.4 (calc) (ref 1.0–2.5)
ALT: 17 U/L (ref 6–29)
AST: 26 U/L (ref 10–35)
Albumin: 4.2 g/dL (ref 3.6–5.1)
Alkaline phosphatase (APISO): 103 U/L (ref 37–153)
BUN: 11 mg/dL (ref 7–25)
CO2: 33 mmol/L — ABNORMAL HIGH (ref 20–32)
Calcium: 10 mg/dL (ref 8.6–10.4)
Chloride: 102 mmol/L (ref 98–110)
Creat: 0.69 mg/dL (ref 0.50–0.99)
GFR, Est African American: 103 mL/min/{1.73_m2} (ref >=60)
GFR, Est Non African American: 89 mL/min/{1.73_m2} (ref >=60)
Globulin: 2.9 g/dL (calc) (ref 1.9–3.7)
Glucose, Bld: 106 mg/dL (ref 65–139)
Potassium: 4.1 mmol/L (ref 3.5–5.3)
Sodium: 140 mmol/L (ref 135–146)
Total Bilirubin: 0.3 mg/dL (ref 0.2–1.2)
Total Protein: 7.1 g/dL (ref 6.1–8.1)

## 2019-03-29 LAB — CBC WITH DIFFERENTIAL/PLATELET
Absolute Monocytes: 948 cells/uL (ref 200–950)
Basophils Absolute: 104 cells/uL (ref 0–200)
Basophils Relative: 1.2 %
Eosinophils Absolute: 200 cells/uL (ref 15–500)
Eosinophils Relative: 2.3 %
HCT: 43 % (ref 35.0–45.0)
Hemoglobin: 14.7 g/dL (ref 11.7–15.5)
Lymphs Abs: 3271 cells/uL (ref 850–3900)
MCH: 29.1 pg (ref 27.0–33.0)
MCHC: 34.2 g/dL (ref 32.0–36.0)
MCV: 85.1 fL (ref 80.0–100.0)
MPV: 10.1 fL (ref 7.5–12.5)
Monocytes Relative: 10.9 %
Neutro Abs: 4176 cells/uL (ref 1500–7800)
Neutrophils Relative %: 48 %
Platelets: 436 10*3/uL — ABNORMAL HIGH (ref 140–400)
RBC: 5.05 10*6/uL (ref 3.80–5.10)
RDW: 13 % (ref 11.0–15.0)
Total Lymphocyte: 37.6 %
WBC: 8.7 10*3/uL (ref 3.8–10.8)

## 2019-03-29 LAB — PROTIME-INR
INR: 1
Prothrombin Time: 10.9 s (ref 9.0–11.5)

## 2019-03-29 LAB — TEST AUTHORIZATION

## 2019-03-29 LAB — MITOCHONDRIAL AB RFLX TITR: Mitochondrial Antibody Screen: NEGATIVE

## 2019-03-29 LAB — SMOOTH MUSC W/REFL: SMOOTH MUSCLE AB SCREEN: NEGATIVE

## 2019-03-29 LAB — ANA, IFA COMPREHENSIVE PANEL
Anti Nuclear Antibody (ANA): NEGATIVE
ENA SM Ab Ser-aCnc: <1 AI
SM/RNP: <1 AI
SSA (Ro) (ENA) Antibody, IgG: <1 AI
SSB (La) (ENA) Antibody, IgG: <1 AI
Scleroderma (Scl-70) (ENA) Antibody, IgG: <1 AI
ds DNA Ab: 3 IU/mL

## 2019-03-29 NOTE — Telephone Encounter (Signed)
Please call patient she wants to discuss why she needs the [name] (it's sch'd 04/04/19 & she's aware)

## 2019-03-29 NOTE — Telephone Encounter (Signed)
Spoke to patient, she states she need refill on Azathioprine - states [place] has called but I didn't;t see anything from them

## 2019-03-30 MED ORDER — AZATHIOPRINE 50 MG PO TABS: 75.0000 mg | ORAL_TABLET | Freq: Every day | ORAL | 6 refills | Status: DC

## 2019-03-30 NOTE — Telephone Encounter (Signed)
I called patient and answered questions.

## 2019-03-30 NOTE — Telephone Encounter (Signed)
I called patient and explained rationale for ultrasound per other patient message.  I also refilled azathioprine.  All questions answered.

## 2019-03-31 ENCOUNTER — Other Ambulatory Visit (INDEPENDENT_AMBULATORY_CARE_PROVIDER_SITE_OTHER): Payer: Self-pay | Admitting: *Deleted

## 2019-04-04 ENCOUNTER — Other Ambulatory Visit (INDEPENDENT_AMBULATORY_CARE_PROVIDER_SITE_OTHER): Payer: Self-pay | Admitting: *Deleted

## 2019-04-04 ENCOUNTER — Other Ambulatory Visit: Payer: Self-pay | Admitting: Cardiology

## 2019-04-04 ENCOUNTER — Ambulatory Visit (HOSPITAL_COMMUNITY)
Admission: RE | Admit: 2019-04-04 | Discharge: 2019-04-04 | Disposition: A | Discharge status: Home / Self Care | Payer: Medicare Other | Source: Ambulatory Visit | Attending: Gastroenterology | Admitting: Gastroenterology

## 2019-04-04 ENCOUNTER — Other Ambulatory Visit: Payer: Self-pay

## 2019-04-04 DIAGNOSIS — K754 Autoimmune hepatitis: Secondary | ICD-10-CM | POA: Diagnosis present

## 2019-04-12 ENCOUNTER — Other Ambulatory Visit (INDEPENDENT_AMBULATORY_CARE_PROVIDER_SITE_OTHER): Payer: Self-pay | Admitting: *Deleted

## 2019-04-17 ENCOUNTER — Telehealth: Payer: Self-pay | Admitting: Orthopedic Surgery

## 2019-04-17 ENCOUNTER — Ambulatory Visit: Payer: Medicare Other | Admitting: Orthopedic Surgery

## 2019-04-17 NOTE — Telephone Encounter (Signed)
I called patient. She states she does not have any questions, just could not take the Gabapentin. It made her sick. I have advised not really anything else that take take the place of the Gabapentin. Told her to keep appointment and if she needs any studies or further treatment, Dr Aline Brochure will let her know then  To you FYI

## 2019-04-17 NOTE — Telephone Encounter (Signed)
Spoke with patient regarding today's appointment; states unable to come due to icy back roads where [place] lives. Has question about medication; states had discontinued Gabapentin as advised. States that she has been unable to take Tylenol or Advil due to other medical issues. States had been taking low dose aspirin, however, has been advised to discontinue that due to upcoming diagnostic procedure scheduled 05/10/19.  Please advise.

## 2019-04-17 NOTE — Telephone Encounter (Signed)
Patient re-scheduled to 05/03/19 (from today, 04/17/19).

## 2019-04-17 NOTE — Telephone Encounter (Signed)
Has she RS appointment for today ?

## 2019-05-03 ENCOUNTER — Encounter: Payer: Self-pay | Admitting: Orthopedic Surgery

## 2019-05-03 ENCOUNTER — Ambulatory Visit: Payer: Medicare Other | Admitting: Orthopedic Surgery

## 2019-05-08 ENCOUNTER — Other Ambulatory Visit (HOSPITAL_COMMUNITY)
Admission: RE | Admit: 2019-05-08 | Discharge: 2019-05-08 | Disposition: A | Discharge status: Home / Self Care | Payer: Medicare Other | Source: Ambulatory Visit | Attending: Internal Medicine | Admitting: Internal Medicine

## 2019-05-08 ENCOUNTER — Other Ambulatory Visit: Payer: Self-pay

## 2019-05-08 ENCOUNTER — Other Ambulatory Visit (HOSPITAL_COMMUNITY): Payer: Medicare Other

## 2019-05-09 ENCOUNTER — Other Ambulatory Visit: Payer: Self-pay

## 2019-05-09 ENCOUNTER — Other Ambulatory Visit (HOSPITAL_COMMUNITY)
Admission: RE | Admit: 2019-05-09 | Discharge: 2019-05-09 | Disposition: A | Discharge status: Home / Self Care | Payer: Medicare Other | Source: Ambulatory Visit | Attending: Internal Medicine | Admitting: Internal Medicine

## 2019-05-09 DIAGNOSIS — Z20822 Contact with and (suspected) exposure to covid-19: Secondary | ICD-10-CM | POA: Diagnosis not present

## 2019-05-09 DIAGNOSIS — Z01812 Encounter for preprocedural laboratory examination: Secondary | ICD-10-CM | POA: Diagnosis present

## 2019-05-09 LAB — SARS CORONAVIRUS 2 (TAT 6-24 HRS): SARS Coronavirus 2: NEGATIVE

## 2019-05-10 ENCOUNTER — Encounter (HOSPITAL_COMMUNITY): Payer: Self-pay | Admitting: Anesthesiology

## 2019-05-10 ENCOUNTER — Encounter (HOSPITAL_COMMUNITY): Payer: Self-pay | Admitting: Internal Medicine

## 2019-05-10 ENCOUNTER — Ambulatory Visit (HOSPITAL_COMMUNITY)
Admission: RE | Admit: 2019-05-10 | Discharge: 2019-05-10 | Disposition: A | Discharge status: Home / Self Care | Payer: Medicare Other | Attending: Internal Medicine | Admitting: Internal Medicine

## 2019-05-10 ENCOUNTER — Other Ambulatory Visit: Payer: Self-pay

## 2019-05-10 ENCOUNTER — Encounter (HOSPITAL_COMMUNITY)
Admission: RE | Disposition: A | Discharge status: Home / Self Care | Payer: Self-pay | Source: Home / Self Care | Attending: Internal Medicine

## 2019-05-10 DIAGNOSIS — Z87442 Personal history of urinary calculi: Secondary | ICD-10-CM | POA: Diagnosis not present

## 2019-05-10 DIAGNOSIS — E669 Obesity, unspecified: Secondary | ICD-10-CM | POA: Insufficient documentation

## 2019-05-10 DIAGNOSIS — E039 Hypothyroidism, unspecified: Secondary | ICD-10-CM | POA: Insufficient documentation

## 2019-05-10 DIAGNOSIS — D124 Benign neoplasm of descending colon: Secondary | ICD-10-CM | POA: Insufficient documentation

## 2019-05-10 DIAGNOSIS — Z8673 Personal history of transient ischemic attack (TIA), and cerebral infarction without residual deficits: Secondary | ICD-10-CM | POA: Insufficient documentation

## 2019-05-10 DIAGNOSIS — Z90710 Acquired absence of both cervix and uterus: Secondary | ICD-10-CM | POA: Insufficient documentation

## 2019-05-10 DIAGNOSIS — I252 Old myocardial infarction: Secondary | ICD-10-CM | POA: Diagnosis not present

## 2019-05-10 DIAGNOSIS — F329 Major depressive disorder, single episode, unspecified: Secondary | ICD-10-CM | POA: Insufficient documentation

## 2019-05-10 DIAGNOSIS — Z800 Family history of malignant neoplasm of digestive organs: Secondary | ICD-10-CM | POA: Insufficient documentation

## 2019-05-10 DIAGNOSIS — Z6835 Body mass index (BMI) 35.0-35.9, adult: Secondary | ICD-10-CM | POA: Insufficient documentation

## 2019-05-10 DIAGNOSIS — Z809 Family history of malignant neoplasm, unspecified: Secondary | ICD-10-CM | POA: Insufficient documentation

## 2019-05-10 DIAGNOSIS — Z7984 Long term (current) use of oral hypoglycemic drugs: Secondary | ICD-10-CM | POA: Insufficient documentation

## 2019-05-10 DIAGNOSIS — I2510 Atherosclerotic heart disease of native coronary artery without angina pectoris: Secondary | ICD-10-CM | POA: Insufficient documentation

## 2019-05-10 DIAGNOSIS — K7460 Unspecified cirrhosis of liver: Secondary | ICD-10-CM | POA: Insufficient documentation

## 2019-05-10 DIAGNOSIS — I4891 Unspecified atrial fibrillation: Secondary | ICD-10-CM | POA: Insufficient documentation

## 2019-05-10 DIAGNOSIS — D123 Benign neoplasm of transverse colon: Secondary | ICD-10-CM | POA: Insufficient documentation

## 2019-05-10 DIAGNOSIS — Z833 Family history of diabetes mellitus: Secondary | ICD-10-CM | POA: Insufficient documentation

## 2019-05-10 DIAGNOSIS — Z955 Presence of coronary angioplasty implant and graft: Secondary | ICD-10-CM | POA: Insufficient documentation

## 2019-05-10 DIAGNOSIS — Z87891 Personal history of nicotine dependence: Secondary | ICD-10-CM | POA: Insufficient documentation

## 2019-05-10 DIAGNOSIS — J449 Chronic obstructive pulmonary disease, unspecified: Secondary | ICD-10-CM | POA: Diagnosis not present

## 2019-05-10 DIAGNOSIS — R1310 Dysphagia, unspecified: Secondary | ICD-10-CM

## 2019-05-10 DIAGNOSIS — Z1211 Encounter for screening for malignant neoplasm of colon: Secondary | ICD-10-CM | POA: Diagnosis present

## 2019-05-10 DIAGNOSIS — Z91048 Other nonmedicinal substance allergy status: Secondary | ICD-10-CM | POA: Insufficient documentation

## 2019-05-10 DIAGNOSIS — Z7951 Long term (current) use of inhaled steroids: Secondary | ICD-10-CM | POA: Insufficient documentation

## 2019-05-10 DIAGNOSIS — K219 Gastro-esophageal reflux disease without esophagitis: Secondary | ICD-10-CM | POA: Insufficient documentation

## 2019-05-10 DIAGNOSIS — D122 Benign neoplasm of ascending colon: Secondary | ICD-10-CM | POA: Insufficient documentation

## 2019-05-10 DIAGNOSIS — Z7982 Long term (current) use of aspirin: Secondary | ICD-10-CM | POA: Insufficient documentation

## 2019-05-10 DIAGNOSIS — E785 Hyperlipidemia, unspecified: Secondary | ICD-10-CM | POA: Diagnosis not present

## 2019-05-10 DIAGNOSIS — K5730 Diverticulosis of large intestine without perforation or abscess without bleeding: Secondary | ICD-10-CM | POA: Diagnosis not present

## 2019-05-10 DIAGNOSIS — F419 Anxiety disorder, unspecified: Secondary | ICD-10-CM | POA: Insufficient documentation

## 2019-05-10 DIAGNOSIS — R1314 Dysphagia, pharyngoesophageal phase: Secondary | ICD-10-CM | POA: Insufficient documentation

## 2019-05-10 DIAGNOSIS — Z8249 Family history of ischemic heart disease and other diseases of the circulatory system: Secondary | ICD-10-CM | POA: Insufficient documentation

## 2019-05-10 DIAGNOSIS — Z888 Allergy status to other drugs, medicaments and biological substances status: Secondary | ICD-10-CM | POA: Insufficient documentation

## 2019-05-10 DIAGNOSIS — G8929 Other chronic pain: Secondary | ICD-10-CM | POA: Diagnosis not present

## 2019-05-10 DIAGNOSIS — Z9049 Acquired absence of other specified parts of digestive tract: Secondary | ICD-10-CM | POA: Insufficient documentation

## 2019-05-10 DIAGNOSIS — K644 Residual hemorrhoidal skin tags: Secondary | ICD-10-CM | POA: Insufficient documentation

## 2019-05-10 DIAGNOSIS — R1319 Other dysphagia: Secondary | ICD-10-CM | POA: Insufficient documentation

## 2019-05-10 DIAGNOSIS — M1990 Unspecified osteoarthritis, unspecified site: Secondary | ICD-10-CM | POA: Insufficient documentation

## 2019-05-10 DIAGNOSIS — Z820 Family history of epilepsy and other diseases of the nervous system: Secondary | ICD-10-CM | POA: Insufficient documentation

## 2019-05-10 DIAGNOSIS — Z91040 Latex allergy status: Secondary | ICD-10-CM | POA: Insufficient documentation

## 2019-05-10 DIAGNOSIS — I10 Essential (primary) hypertension: Secondary | ICD-10-CM | POA: Insufficient documentation

## 2019-05-10 DIAGNOSIS — Z9081 Acquired absence of spleen: Secondary | ICD-10-CM | POA: Insufficient documentation

## 2019-05-10 DIAGNOSIS — M545 Low back pain: Secondary | ICD-10-CM | POA: Diagnosis not present

## 2019-05-10 DIAGNOSIS — Z853 Personal history of malignant neoplasm of breast: Secondary | ICD-10-CM | POA: Insufficient documentation

## 2019-05-10 DIAGNOSIS — Z8261 Family history of arthritis: Secondary | ICD-10-CM | POA: Insufficient documentation

## 2019-05-10 DIAGNOSIS — Z85820 Personal history of malignant melanoma of skin: Secondary | ICD-10-CM | POA: Insufficient documentation

## 2019-05-10 DIAGNOSIS — Z79899 Other long term (current) drug therapy: Secondary | ICD-10-CM | POA: Insufficient documentation

## 2019-05-10 HISTORY — PX: ESOPHAGOGASTRODUODENOSCOPY: SHX5428

## 2019-05-10 HISTORY — PX: POLYPECTOMY: SHX5525

## 2019-05-10 HISTORY — PX: COLONOSCOPY: SHX5424

## 2019-05-10 HISTORY — PX: MALONEY DILATION: SHX5535

## 2019-05-10 SURGERY — COLONOSCOPY
Anesthesia: Moderate Sedation

## 2019-05-10 MED ORDER — STERILE WATER FOR IRRIGATION IR SOLN
Status: DC | PRN
  Administered 2019-05-10: 1.5 mL

## 2019-05-10 MED ORDER — LIDOCAINE VISCOUS HCL 2 % MT SOLN
OROMUCOSAL | Status: AC
  Filled 2019-05-10: qty 15

## 2019-05-10 MED ORDER — MEPERIDINE HCL 50 MG/ML IJ SOLN
INTRAMUSCULAR | Status: DC | PRN
  Administered 2019-05-10 (×4): 25 mg via INTRAVENOUS

## 2019-05-10 MED ORDER — MIDAZOLAM HCL 5 MG/5ML IJ SOLN
INTRAMUSCULAR | Status: DC | PRN
  Administered 2019-05-10: 2 mg via INTRAVENOUS
  Administered 2019-05-10: 1 mg via INTRAVENOUS
  Administered 2019-05-10 (×4): 2 mg via INTRAVENOUS
  Administered 2019-05-10: 3 mg via INTRAVENOUS
  Administered 2019-05-10: 1 mg via INTRAVENOUS

## 2019-05-10 MED ORDER — MIDAZOLAM HCL 5 MG/5ML IJ SOLN
INTRAMUSCULAR | Status: AC
  Filled 2019-05-10: qty 10

## 2019-05-10 MED ORDER — LIDOCAINE VISCOUS HCL 2 % MT SOLN
OROMUCOSAL | Status: DC | PRN
  Administered 2019-05-10: 4 mL via OROMUCOSAL

## 2019-05-10 MED ORDER — SODIUM CHLORIDE 0.9 % IV SOLN: INTRAVENOUS | Status: DC

## 2019-05-10 MED ORDER — MIDAZOLAM HCL 5 MG/5ML IJ SOLN
INTRAMUSCULAR | Status: AC
  Filled 2019-05-10: qty 5

## 2019-05-10 MED ORDER — MEPERIDINE HCL 50 MG/ML IJ SOLN
INTRAMUSCULAR | Status: AC
  Filled 2019-05-10: qty 1

## 2019-05-10 NOTE — Op Note (Signed)
[facility] Patient Name: [name] Procedure Date: 05/10/2019 1:31 PM MRN: [id number] Date of Birth: 11-08-1950 Attending MD: Hildred Laser , MD CSN: [id number] Age: 69 Admit Type: Outpatient Procedure:                Upper GI endoscopy Indications:              Esophageal dysphagia, Gastro-esophageal reflux                            disease Providers:                Hildred Laser, MD, Otis Peak B. Sharon Seller, RN, Raphael Gibney, Technician Referring MD:             Lemmie Evens, MD Medicines:                Lidocaine spray, Meperidine 100 mg IV, Midazolam 10                            mg IV Complications:            No immediate complications. Estimated Blood Loss:     Estimated blood loss: none. Procedure:                Pre-Anesthesia Assessment:                           - Prior to the procedure, a History and Physical                            was performed, and patient medications and                            allergies were reviewed. The patient's tolerance of                            previous anesthesia was also reviewed. The risks                            and benefits of the procedure and the sedation                            options and risks were discussed with the patient.                            All questions were answered, and informed consent                            was obtained. Prior Anticoagulants: The patient has                            taken no previous anticoagulant or antiplatelet                            agents except  for aspirin. ASA Grade Assessment:                            III - A patient with severe systemic disease. After                            reviewing the risks and benefits, the patient was                            deemed in satisfactory condition to undergo the                            procedure.                           After obtaining informed consent, the endoscope was                 passed under direct vision. Throughout the                            procedure, the patient's blood pressure, pulse, and                            oxygen saturations were monitored continuously. The                            [id number]-[id number]) scope was introduced through the                            mouth, and advanced to the second part of duodenum.                            The upper GI endoscopy was accomplished without                            difficulty. The patient tolerated the procedure                            well. Scope In: 2:01:55 PM Scope Out: 2:10:17 PM Total Procedure Duration: 0 hours 8 minutes 22 seconds  Findings:      The examined esophagus was normal.      The Z-line was regular and was found 39 cm from the incisors.      No endoscopic abnormality was evident in the esophagus to explain the       patient's complaint of dysphagia. It was decided, however, to proceed       with dilation of the entire esophagus. The scope was withdrawn. Dilation       was performed with a Maloney dilator with no resistance at 41 Fr. The       dilation site was examined following endoscope reinsertion and showed no       change and no bleeding, mucosal tear or perforation.      The entire examined stomach was normal.      The duodenal bulb and second portion of the duodenum were normal. Impression:               -  Normal esophagus.                           - Z-line regular, 39 cm from the incisors.                           - No endoscopic esophageal abnormality to explain                            patient's dysphagia. Esophagus dilated. Dilated.                           - Normal stomach.                           - Normal duodenal bulb and second portion of the                            duodenum.                           - No specimens collected. Moderate Sedation:      Moderate (conscious) sedation was administered by the endoscopy nurse       and  supervised by the endoscopist. The following parameters were       monitored: oxygen saturation, heart rate, blood pressure, CO2       capnography and response to care. Total physician intraservice time was       16 minutes. Recommendation:           - Patient has a contact number available for                            emergencies. The signs and symptoms of potential                            delayed complications were discussed with the                            patient. Return to normal activities tomorrow.                            Written discharge instructions were provided to the                            patient.                           - Resume previous diet today.                           - Continue present medications.                           - See the other procedure note for documentation of                            additional recommendations. Procedure  Code(s):        --- Professional ---                           [phone], Esophagogastroduodenoscopy, flexible,                            transoral; diagnostic, including collection of                            specimen(s) by brushing or washing, when performed                            (separate procedure)                           43450, Dilation of esophagus, by unguided sound or                            bougie, single or multiple passes                           G0500, Moderate sedation services provided by the                            same physician or other qualified health care                            professional performing a gastrointestinal                            endoscopic service that sedation supports,                            requiring the presence of an independent trained                            observer to assist in the monitoring of the                            patient's level of consciousness and physiological                            status; initial 15 minutes of intra-service  time;                            patient age 70 years or older (additional time may                            be reported with [phone], as appropriate) Diagnosis Code(s):        --- Professional ---                           R13.14, Dysphagia, pharyngoesophageal phase  K21.9, Gastro-esophageal reflux disease without                            esophagitis CPT copyright 2019 American Medical Association. All rights reserved. The codes documented in this report are preliminary and upon coder review may  be revised to meet current compliance requirements. Hildred Laser, MD Hildred Laser, MD 05/10/2019 3:11:15 PM This report has been signed electronically. Number of Addenda: 0

## 2019-05-10 NOTE — Discharge Instructions (Signed)
Resume aspirin on 05/12/2019. Resume other medications as before High-fiber diet. No driving for 24 hours. Physician will call with biopsy results.]  PATIENT INSTRUCTIONS POST-ANESTHESIA  IMMEDIATELY FOLLOWING SURGERY:  Do not drive or operate machinery for the first twenty four hours after surgery.  Do not make any important decisions for twenty four hours after surgery or while taking narcotic pain medications or sedatives.  If you develop intractable nausea and vomiting or a severe headache please notify your doctor immediately.  FOLLOW-UP:  Please make an appointment with your surgeon as instructed. You do not need to follow up with anesthesia unless specifically instructed to do so.  WOUND CARE INSTRUCTIONS (if applicable):  Keep a dry clean dressing on the anesthesia/puncture wound site if there is drainage.  Once the wound has quit draining you may leave it open to air.  Generally you should leave the bandage intact for twenty four hours unless there is drainage.  If the epidural site drains for more than 36-48 hours please call the anesthesia department.  QUESTIONS?:  Please feel free to call your physician or the hospital operator if you have any questions, and they will be happy to assist you.      Upper Endoscopy, Adult, Care After This sheet gives you information about how to care for yourself after your procedure. Your health care provider may also give you more specific instructions. If you have problems or questions, contact your health care provider. What can I expect after the procedure? After the procedure, it is common to have:  A sore throat.  Mild stomach pain or discomfort.  Bloating.  Nausea. Follow these instructions at home:   Follow instructions from your health care provider about what to eat or drink after your procedure.  Return to your normal activities as told by your health care provider. Ask your health care provider what activities are safe for  you.  Take over-the-counter and prescription medicines only as told by your health care provider.  Do not drive for 24 hours if you were given a sedative during your procedure.  Keep all follow-up visits as told by your health care provider. This is important. Contact a health care provider if you have:  A sore throat that lasts longer than one day.  Trouble swallowing. Get help right away if:  You vomit blood or your vomit looks like coffee grounds.  You have: ? A fever. ? Bloody, black, or tarry stools. ? A severe sore throat or you cannot swallow. ? Difficulty breathing. ? Severe pain in your chest or abdomen. Summary  After the procedure, it is common to have a sore throat, mild stomach discomfort, bloating, and nausea.  Do not drive for 24 hours if you were given a sedative during the procedure.  Follow instructions from your health care provider about what to eat or drink after your procedure.  Return to your normal activities as told by your health care provider. This information is not intended to replace advice given to you by your health care provider. Make sure you discuss any questions you have with your health care provider. Document Revised: 08/24/2017 Document Reviewed: 08/02/2017 Elsevier Patient Education  [place].   Colonoscopy, Adult, Care After This sheet gives you information about how to care for yourself after your procedure. Your doctor may also give you more specific instructions. If you have problems or questions, call your doctor. What can I expect after the procedure? After the procedure, it is common to  have:  A small amount of blood in your poop (stool) for 24 hours.  Some gas.  Mild cramping or bloating in your belly (abdomen). Follow these instructions at home: Eating and drinking   Drink enough fluid to keep your pee (urine) pale yellow.  Follow instructions from your doctor about what you cannot eat or drink.  Return  to your normal diet as told by your doctor. Avoid heavy or fried foods that are hard to digest. Activity  Rest as told by your doctor.  Do not sit for a long time without moving. Get up to take short walks every 1-2 hours. This is important. Ask for help if you feel weak or unsteady.  Return to your normal activities as told by your doctor. Ask your doctor what activities are safe for you. To help cramping and bloating:   Try walking around.  Put heat on your belly as told by your doctor. Use the heat source that your doctor recommends, such as a moist heat pack or a heating pad. ? Put a towel between your skin and the heat source. ? Leave the heat on for 20-30 minutes. ? Remove the heat if your skin turns bright red. This is very important if you are unable to feel pain, heat, or cold. You may have a greater risk of getting burned. General instructions  For the first 24 hours after the procedure: ? Do not drive or use machinery. ? Do not sign important documents. ? Do not drink alcohol. ? Do your daily activities more slowly than normal. ? Eat foods that are soft and easy to digest.  Take over-the-counter or prescription medicines only as told by your doctor.  Keep all follow-up visits as told by your doctor. This is important. Contact a doctor if:  You have blood in your poop 2-3 days after the procedure. Get help right away if:  You have more than a small amount of blood in your poop.  You see large clumps of tissue (blood clots) in your poop.  Your belly is swollen.  You feel like you may vomit (nauseous).  You vomit.  You have a fever.  You have belly pain that gets worse, and medicine does not help your pain. Summary  After the procedure, it is common to have a small amount of blood in your poop. You may also have mild cramping and bloating in your belly.  For the first 24 hours after the procedure, do not drive or use machinery, do not sign important  documents, and do not drink alcohol.  Get help right away if you have a lot of blood in your poop, feel like you may vomit, have a fever, or have more belly pain. This information is not intended to replace advice given to you by your health care provider. Make sure you discuss any questions you have with your health care provider. Document Revised: 09/26/2018 Document Reviewed: 09/26/2018 Elsevier Patient Education  [place].   Colon Polyps  Polyps are tissue growths inside the body. Polyps can grow in many places, including the large intestine (colon). A polyp may be a round bump or a mushroom-shaped growth. You could have one polyp or several. Most colon polyps are noncancerous (benign). However, some colon polyps can become cancerous over time. Finding and removing the polyps early can help prevent this. What are the causes? The exact cause of colon polyps is not known. What increases the risk? You are more likely to develop  this condition if you:  Have a family history of colon cancer or colon polyps.  Are older than 68 or older than 45 if you are African American.  Have inflammatory bowel disease, such as ulcerative colitis or Crohn's disease.  Have certain hereditary conditions, such as: ? Familial adenomatous polyposis. ? Lynch syndrome. ? Turcot syndrome. ? Peutz-Jeghers syndrome.  Are overweight.  Smoke cigarettes.  Do not get enough exercise.  Drink too much alcohol.  Eat a diet that is high in fat and red meat and low in fiber.  Had childhood cancer that was treated with abdominal radiation. What are the signs or symptoms? Most polyps do not cause symptoms. If you have symptoms, they may include:  Blood coming from your rectum when having a bowel movement.  Blood in your stool. The stool may look dark red or black.  Abdominal pain.  A change in bowel habits, such as constipation or diarrhea. How is this diagnosed? This condition is diagnosed  with a colonoscopy. This is a procedure in which a lighted, flexible scope is inserted into the anus and then passed into the colon to examine the area. Polyps are sometimes found when a colonoscopy is done as part of routine cancer screening tests. How is this treated? Treatment for this condition involves removing any polyps that are found. Most polyps can be removed during a colonoscopy. Those polyps will then be tested for cancer. Additional treatment may be needed depending on the results of testing. Follow these instructions at home: Lifestyle  Maintain a healthy weight, or lose weight if recommended by your health care provider.  Exercise every day or as told by your health care provider.  Do not use any products that contain nicotine or tobacco, such as cigarettes and e-cigarettes. If you need help quitting, ask your health care provider.  If you drink alcohol, limit how much you have: ? 0-1 drink a day for women. ? 0-2 drinks a day for men.  Be aware of how much alcohol is in your drink. In the [place] one drink equals one 12 oz bottle of beer (355 mL), one 5 oz glass of wine (148 mL), or one 1 oz shot of hard liquor (44 mL). Eating and drinking   Eat foods that are high in fiber, such as fruits, vegetables, and whole grains.  Eat foods that are high in calcium and vitamin D, such as milk, cheese, yogurt, eggs, liver, fish, and broccoli.  Limit foods that are high in fat, such as fried foods and desserts.  Limit the amount of red meat and processed meat you eat, such as hot dogs, sausage, bacon, and lunch meats. General instructions  Keep all follow-up visits as told by your health care provider. This is important. ? This includes having regularly scheduled colonoscopies. ? Talk to your health care provider about when you need a colonoscopy. Contact a health care provider if:  You have new or worsening bleeding during a bowel movement.  You have new or increased blood in  your stool.  You have a change in bowel habits.  You lose weight for no known reason. Summary  Polyps are tissue growths inside the body. Polyps can grow in many places, including the colon.  Most colon polyps are noncancerous (benign), but some can become cancerous over time.  This condition is diagnosed with a colonoscopy.  Treatment for this condition involves removing any polyps that are found. Most polyps can be removed during a colonoscopy. This information  is not intended to replace advice given to you by your health care provider. Make sure you discuss any questions you have with your health care provider. Document Revised: 06/17/2017 Document Reviewed: 06/17/2017 Elsevier Patient Education  [place].   Diverticulosis  Diverticulosis is a condition that develops when small pouches (diverticula) form in the wall of the large intestine (colon). The colon is where water is absorbed and stool (feces) is formed. The pouches form when the inside layer of the colon pushes through weak spots in the outer layers of the colon. You may have a few pouches or many of them. The pouches usually do not cause problems unless they become inflamed or infected. When this happens, the condition is called diverticulitis. What are the causes? The cause of this condition is not known. What increases the risk? The following factors may make you more likely to develop this condition:  Being older than age 74. Your risk for this condition increases with age. Diverticulosis is rare among people younger than age 88. By age 46, many people have it.  Eating a low-fiber diet.  Having frequent constipation.  Being overweight.  Not getting enough exercise.  Smoking.  Taking over-the-counter pain medicines, like aspirin and ibuprofen.  Having a family history of diverticulosis. What are the signs or symptoms? In most people, there are no symptoms of this condition. If you do have symptoms,  they may include:  Bloating.  Cramps in the abdomen.  Constipation or diarrhea.  Pain in the lower left side of the abdomen. How is this diagnosed? Because diverticulosis usually has no symptoms, it is most often diagnosed during an exam for other colon problems. The condition may be diagnosed by:  Using a flexible scope to examine the colon (colonoscopy).  Taking an X-ray of the colon after dye has been put into the colon (barium enema).  Having a CT scan. How is this treated? You may not need treatment for this condition. Your health care provider may recommend treatment to prevent problems. You may need treatment if you have symptoms or if you previously had diverticulitis. Treatment may include:  Eating a high-fiber diet.  Taking a fiber supplement.  Taking a live bacteria supplement (probiotic).  Taking medicine to relax your colon. Follow these instructions at home: Medicines  Take over-the-counter and prescription medicines only as told by your health care provider.  If told by your health care provider, take a fiber supplement or probiotic. Constipation prevention Your condition may cause constipation. To prevent or treat constipation, you may need to:  Drink enough fluid to keep your urine pale yellow.  Take over-the-counter or prescription medicines.  Eat foods that are high in fiber, such as beans, whole grains, and fresh fruits and vegetables.  Limit foods that are high in fat and processed sugars, such as fried or sweet foods.  General instructions  Try not to strain when you have a bowel movement.  Keep all follow-up visits as told by your health care provider. This is important. Contact a health care provider if you:  Have pain in your abdomen.  Have bloating.  Have cramps.  Have not had a bowel movement in 3 days. Get help right away if:  Your pain gets worse.  Your bloating becomes very bad.  You have a fever or chills, and your  symptoms suddenly get worse.  You vomit.  You have bowel movements that are bloody or black.  You have bleeding from your rectum. Summary  Diverticulosis is a condition that develops when small pouches (diverticula) form in the wall of the large intestine (colon).  You may have a few pouches or many of them.  This condition is most often diagnosed during an exam for other colon problems.  Treatment may include increasing the fiber in your diet, taking supplements, or taking medicines. This information is not intended to replace advice given to you by your health care provider. Make sure you discuss any questions you have with your health care provider. Document Revised: 09/29/2018 Document Reviewed: 09/29/2018 Elsevier Patient Education  [place].

## 2019-05-10 NOTE — Op Note (Signed)
[facility] Patient Name: [name] Procedure Date: 05/10/2019 2:13 PM MRN: [id number] Date of Birth: 15-Jun-1950 Attending MD: Hildred Laser , MD CSN: [id number] Age: 69 Admit Type: Outpatient Procedure:                Colonoscopy Indications:              Screening for colorectal malignant neoplasm Providers:                Hildred Laser, MD, Otis Peak B. Sharon Seller, RN, Randa Spike, Technician Referring MD:             Lemmie Evens, MD Medicines:                Midazolam 5 mg IV Complications:            No immediate complications. Estimated Blood Loss:     Estimated blood loss was minimal. Procedure:                Pre-Anesthesia Assessment:                           - Prior to the procedure, a History and Physical                            was performed, and patient medications and                            allergies were reviewed. The patient's tolerance of                            previous anesthesia was also reviewed. The risks                            and benefits of the procedure and the sedation                            options and risks were discussed with the patient.                            All questions were answered, and informed consent                            was obtained. Prior Anticoagulants: The patient has                            taken no previous anticoagulant or antiplatelet                            agents except for aspirin. ASA Grade Assessment:                            III - A patient with severe systemic disease. After  reviewing the risks and benefits, the patient was                            deemed in satisfactory condition to undergo the                            procedure.                           After obtaining informed consent, the colonoscope                            was passed under direct vision. Throughout the                            procedure, the  patient's blood pressure, pulse, and                            oxygen saturations were monitored continuously. The                            [id number] scope was introduced through                            the anus and advanced to the the cecum, identified                            by appendiceal orifice and ileocecal valve. The                            colonoscopy was technically difficult and complex                            due to a redundant colon. Successful completion of                            the procedure was aided by changing the patient's                            position, withdrawing and reinserting the scope and                            scope guide. The patient tolerated the procedure                            well. The quality of the bowel preparation was                            adequate. The ileocecal valve, appendiceal orifice,                            and rectum were photographed. Scope In: 2:14:48 PM Scope Out: 2:59:33 PM Scope Withdrawal Time: 0 hours 8 minutes 27 seconds  Total Procedure Duration: 0 hours  44 minutes 45 seconds  Findings:      The perianal and digital rectal examinations were normal.      Two polyps were found in the transverse colon and ascending colon. The       polyps were small in size. These were biopsied with a cold forceps for       histology. The pathology specimen was placed into Bottle Number 1.      Five polyps were found in the descending colon, transverse colon and       hepatic flexure. The polyps were small in size. These polyps were       removed with a cold snare. Resection and retrieval were complete. The       pathology specimen was placed into Bottle Number 1.      A few diverticula were found in the sigmoid colon.      External hemorrhoids were found during retroflexion. The hemorrhoids       were small. Impression:               - Two small polyps in the transverse colon and in                             the ascending colon. Biopsied.                           - Five small polyps in the descending colon, in the                            transverse colon and at the hepatic flexure,                            removed with a cold snare. Resected and retrieved.                           - Diverticulosis in the sigmoid colon.                           - External hemorrhoids. Moderate Sedation:      Moderate (conscious) sedation was administered by the endoscopy nurse       and supervised by the endoscopist. The following parameters were       monitored: oxygen saturation, heart rate, blood pressure, CO2       capnography and response to care. Total physician intraservice time was       49 minutes. Recommendation:           - Patient has a contact number available for                            emergencies. The signs and symptoms of potential                            delayed complications were discussed with the                            patient. Return to normal activities tomorrow.  Written discharge instructions were provided to the                            patient.                           - High fiber diet today.                           - Continue present medications.                           - No aspirin, ibuprofen, naproxen, or other                            non-steroidal anti-inflammatory drugs for 1 day.                           - Await pathology results.                           - Repeat colonoscopy is recommended. The                            colonoscopy date will be determined after pathology                            results from today's exam become available for                            review. Procedure Code(s):        --- Professional ---                           [phone], Colonoscopy, flexible; with removal of                            tumor(s), polyp(s), or other lesion(s) by snare                            technique                            45380, 59, Colonoscopy, flexible; with biopsy,                            single or multiple                           99153, Moderate sedation; each additional 15                            minutes intraservice time                           99153, Moderate sedation; each additional 15  minutes intraservice time                           G0500, Moderate sedation services provided by the                            same physician or other qualified health care                            professional performing a gastrointestinal                            endoscopic service that sedation supports,                            requiring the presence of an independent trained                            observer to assist in the monitoring of the                            patient's level of consciousness and physiological                            status; initial 15 minutes of intra-service time;                            patient age 63 years or older (additional time may                            be reported with [id number], as appropriate) Diagnosis Code(s):        --- Professional ---                           Z12.11, Encounter for screening for malignant                            neoplasm of colon                           K63.5, Polyp of colon                           K64.4, Residual hemorrhoidal skin tags                           K57.30, Diverticulosis of large intestine without                            perforation or abscess without bleeding CPT copyright 2019 [employer]. All rights reserved. The codes documented in this report are preliminary and upon coder review may  be revised to meet current compliance requirements. Hildred Laser, MD Hildred Laser, MD 05/10/2019 3:17:53 PM This report has been signed electronically. Number of Addenda: 0

## 2019-05-10 NOTE — H&P (Signed)
[name] is an 69 y.o. female.   Chief Complaint: Patient is here for esophagogastroduodenoscopy with esophageal dilation and colonoscopy. HPI: Patient 69 year old Caucasian female with multiple medical problems including chronic GERD who presents with intermittent solid food dysphagia.  She also strangles on her saliva.  She has been evaluated by Dr. Benjamine Mola of ENT service.  She has had few episodes of food impaction relieved with regurgitation.  She points to mid sternal area site of bolus obstruction.  She has good appetite and has not lost any weight involuntarily.  She denies abdominal pain melena or rectal bleeding. She is also undergoing screening colonoscopy.  Last exam was over 10 years ago.  Her father was diagnosed with colon carcinoma in his 35s and he died in his sleep.  Was felt he had cardiac event. Patient stopped her aspirin few days ago.  Patient stopped her aspirin in preparation for this procedure aspirin is on hold.  Past Medical History:  Diagnosis Date  . Anxiety   . Arthritis    "hands, arms, back, neck, knees, fingers" (01/21/2016)  . Atrial fibrillation (HCC)    ASA daiy; came off Coumadin shortly after stroke  . Breast cancer, left breast ([place]) 1970s; 2015  . Cancer of skin of leg    BLE  . 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HCC)    left  . Chronic lower back pain   . Chronic pain   . COPD (chronic obstructive pulmonary disease) ([place])   . Coronary artery disease   . CVA (cerebral vascular accident) [facility]) pt denies right brain CVA   Right brain secondary to right internal carotid artery dissection for which 2 stents were placed;F/u cerebral angiography in 05/2008-minimal plaque; healing of previously identified left internal carotid artery dissection  . Depression   . GERD (gastroesophageal reflux disease)   . Heart murmur   . Hepatic cirrhosis ([place]) 12/20/2017  . Hepatitis   . History of blood transfusion    "related to spleen"  . History of hiatal hernia   .  History of kidney stones   . Hyperlipidemia   . Hypertension   . Hypothyroidism   . Kidney stone   . Melanoma of forearm, left ([place])   . Migraine    "a few migraines/year" (01/21/2016)  . NSTEMI (non-ST elevated myocardial infarction) ([place]) 01/21/2016   Archie Endo 01/21/2016  . Obesity   . Stroke [facility]) 04/2003   2 carotid artery stents in place; small left parietal and left hemispheric CVA.Phillis Knack 01/21/2016  . Tobacco use disorder    50 pack years; questionably discontinued in 2010  . Wears glasses     Past Surgical History:  Procedure Laterality Date  . ABDOMINAL HYSTERECTOMY  1978  . APPENDECTOMY    . BREAST BIOPSY Left 1970s; 2015  . BREAST LUMPECTOMY Left 1970s  . BREAST LUMPECTOMY WITH NEEDLE LOCALIZATION Left 07/07/2013   Procedure: BREAST LUMPECTOMY WITH NEEDLE LOCALIZATION;  Surgeon: Merrie Roof, MD;  Location: [place];  Service: General;  Laterality: Left;  . CARDIAC CATHETERIZATION N/A 01/22/2016   Procedure: Left Heart Cath and Coronary Angiography;  Surgeon: Peter M Martinique, MD;  Location: [place] [facility];  Service: Cardiovascular;  Laterality: N/A;  . CARDIAC CATHETERIZATION N/A 01/22/2016   Procedure: Coronary Stent Intervention;  Surgeon: Peter M Martinique, MD;  Location: [place] [facility];  Service: Cardiovascular;  Laterality: N/A;  . CAROTID STENT Left 04/2003   small left parietal and left hemispheric CVA/notes 07/29/2010  . CHOLECYSTECTOMY  OPEN    . COLONOSCOPY  2011  . ESOPHAGOGASTRODUODENOSCOPY  08/06/2011   Procedure: ESOPHAGOGASTRODUODENOSCOPY (EGD);  Surgeon: Rogene Houston, MD;  [facility];  Service: Endoscopy;  Laterality: N/A;  . FLEXIBLE SIGMOIDOSCOPY  08/06/2011   Procedure: FLEXIBLE SIGMOIDOSCOPY;  Surgeon: Rogene Houston, MD;  [facility];  Service: Endoscopy;  Laterality: N/A;  . INGUINAL HERNIA REPAIR Left 1970s  . LARYNX SURGERY  1970s   Polyps excised  . MELANOMA EXCISION Left ~ 2016   forearm  .  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   spontaneous rupture    Family History  Problem Relation Age of Onset  . Heart failure Mother   . Cancer Father   . Heart failure Father   . Cancer Sister   . Dementia Sister   . Heart disease Other   . Arthritis Other   . Cancer Other   . Diabetes Other   . Kidney disease Other   . Cancer Sister   . Heart failure Brother    Social History:  reports that she quit smoking about 11 years ago. Her smoking use included cigarettes. She has a 40.00 pack-year smoking history. She has never used smokeless tobacco. She reports that she does not drink alcohol or use drugs.  Allergies:  Allergies  Allergen Reactions  . Gabapentin Nausea And Vomiting  . Prednisone Palpitations  . Tape Other (See Comments)    Causes blisters to form  . Latex Rash and Other (See Comments)    Blisters    Medications Prior to Admission  Medication Sig Dispense Refill  . albuterol (PROVENTIL HFA;VENTOLIN HFA) 108 (90 Base) MCG/ACT inhaler Inhale 2 puffs into the lungs every 6 (six) hours as needed for wheezing or shortness of breath.    Marland Kitchen albuterol (PROVENTIL) (2.5 MG/3ML) 0.083% nebulizer solution Take 2.5 mg by nebulization every 6 (six) hours as needed for wheezing or shortness of breath.    Marland Kitchen amLODipine (NORVASC) 5 MG tablet Take 1 tablet (5 mg total) by mouth daily. 90 tablet 1  . aspirin EC 81 MG tablet Take 1 tablet (81 mg total) by mouth daily.    Marland Kitchen aspirin-acetaminophen-caffeine (EXCEDRIN MIGRAINE) 250-250-65 MG tablet Take 1 tablet by mouth every 6 (six) hours as needed for headache.    Marland Kitchen atorvastatin (LIPITOR) 80 MG tablet TAKE ONE TABLET BY MOUTH DAILY AT 6 P.M. (Patient taking differently: Take 80 mg by mouth daily at 6 PM. ) 90 tablet 3  . azaTHIOprine (IMURAN) 50 MG tablet Take 1.5 tablets (75 mg total) by mouth daily. 45 tablet 6  . cetirizine (ZYRTEC) 10 MG tablet Take 10 mg by mouth daily.    . clorazepate (TRANXENE) 7.5 MG tablet Take 7.5 mg by mouth 3 (three)  times daily as needed for anxiety.     . famotidine (PEPCID) 20 MG tablet Take 20 mg by mouth at bedtime.     . furosemide (LASIX) 40 MG tablet Take 40 mg by mouth daily as needed for fluid.     Marland Kitchen HYDROcodone-acetaminophen (NORCO) 10-325 MG tablet Take 1 tablet by mouth 3 (three) times daily as needed for moderate pain.     . isosorbide mononitrate (ISMO,MONOKET) 20 MG tablet Take 20 mg by mouth 2 (two) times daily at 10 AM and 5 PM.    . levothyroxine (SYNTHROID, LEVOTHROID) 50 MCG tablet Take 50 mcg by mouth daily before breakfast.     . lisinopril (ZESTRIL) 20 MG tablet TAKE 1 TABLET BY MOUTH  DAILY. (Patient not taking: Reported on 05/05/2019) 90 tablet 1  . losartan (COZAAR) 25 MG tablet Take 1 tablet (25 mg total) by mouth daily. 90 tablet 1  . ondansetron (ZOFRAN) 4 MG tablet Take 4 mg by mouth 3 (three) times daily as needed.    . pantoprazole (PROTONIX) 40 MG tablet Take 40 mg by mouth daily.     Marland Kitchen senna (SENOKOT) 8.6 MG tablet Take 2 tablets by mouth at bedtime.     Marland Kitchen SPIRIVA RESPIMAT 2.5 MCG/ACT AERS Inhale 2 puffs into the lungs daily as needed (shortness of breath).     . carvedilol (COREG) 3.125 MG tablet TAEK 1 TABLET BY MOUTH TWICE DAILY WITH MEALS. (Patient not taking: Reported on 05/05/2019) 180 tablet 3  . Fluticasone-Salmeterol (ADVAIR DISKUS) 250-50 MCG/DOSE AEPB Inhale 1 puff into the lungs 2 (two) times daily. (Patient not taking: Reported on 05/05/2019) 60 each 3  . glipiZIDE (GLUCOTROL) 5 MG tablet Take 1 tablet (5 mg total) by mouth daily before breakfast. 30 tablet 3  . nitroGLYCERIN (NITROSTAT) 0.4 MG SL tablet Place 0.4 mg under the tongue every 5 (five) minutes as needed for chest pain.       Results for orders placed or performed during the hospital encounter of 05/09/19 (from the past 48 hour(s))  SARS CORONAVIRUS 2 (TAT 6-24 HRS) Nasopharyngeal Nasopharyngeal Swab     Status: None   Collection Time: 05/09/19  8:15 AM   Specimen: Nasopharyngeal Swab  Result Value Ref  Range   SARS Coronavirus 2 NEGATIVE NEGATIVE    Comment: (NOTE) SARS-CoV-2 target nucleic acids are NOT DETECTED. The SARS-CoV-2 RNA is generally detectable in upper and lower respiratory specimens during the acute phase of infection. Negative results do not preclude SARS-CoV-2 infection, do not rule out co-infections with other pathogens, and should not be used as the sole basis for treatment or other patient management decisions. Negative results must be combined with clinical observations, patient history, and epidemiological information. The expected result is Negative. Fact Sheet for Patients: SugarRoll.be Fact Sheet for Healthcare Providers: https://www.woods-mathews.com/ This test is not yet approved or cleared by the [place] FDA and  has been authorized for detection and/or diagnosis of SARS-CoV-2 by FDA under an Emergency Use Authorization (EUA). This EUA will remain  in effect (meaning this test can be used) for the duration of the COVID-19 declaration under Section 56 4(b)(1) of the Act, 21 U.S.C. section 360bbb-3(b)(1), unless the authorization is terminated or revoked sooner. Performed at [facility], [place] [address], [place], [place] [address]    No results found.  Review of Systems  Blood pressure 131/88, pulse 83, temperature 98.8 F (37.1 C), temperature source Oral, resp. rate 19, height 5\' 3"  (1.6 m), weight 90.7 kg, SpO2 99 %. Physical Exam  Constitutional: She appears well-developed and well-nourished.  HENT:  Mouth/Throat: Oropharynx is clear and moist.  Eyes: Conjunctivae are normal. No scleral icterus.  Neck: No thyromegaly present.  Cardiovascular: Normal rate, regular rhythm and normal heart sounds.  No murmur heard. Respiratory: Effort normal and breath sounds normal.  GI: Soft. She exhibits no distension and no mass. There is no abdominal tenderness.  Musculoskeletal:        General: No edema.   Lymphadenopathy:    She has no cervical adenopathy.  Neurological: She is alert.  Skin: Skin is warm and dry.     Assessment/Plan Esophageal dysphagia in patient with chronic GERD. Esophagogastroduodenoscopy with esophageal dilation and average rescreening colonoscopy Family  history of CRC in a first-degree relative at late onset.  Hildred Laser, MD 05/10/2019, 1:49 PM

## 2019-05-12 LAB — SURGICAL PATHOLOGY

## 2019-05-26 ENCOUNTER — Other Ambulatory Visit: Payer: Self-pay | Admitting: Cardiology

## 2019-06-02 ENCOUNTER — Other Ambulatory Visit: Payer: Self-pay | Admitting: Cardiology

## 2019-06-06 ENCOUNTER — Telehealth: Payer: Self-pay | Admitting: Family Medicine

## 2019-06-06 NOTE — Telephone Encounter (Signed)
Would wait for repeat evaluation to decide, his not mentioned may consider those studies depending on the sweeling. She had a prior US of the leg that was negative for DVT already  Zandra Abts MD

## 2019-06-06 NOTE — Telephone Encounter (Signed)
Patient called stating that she was seen in December 2020 by Jonni Sanger. States that he was going to order some type of Ultrasound on her heart and her legs.

## 2019-06-06 NOTE — Telephone Encounter (Signed)
Requesting ultrasound that was mentioned at last visit for leg swelling especially the right leg that she's had since December 2020. Reports left leg pain behind knee for the past 2 months. Reports the pain wakes her up at night and is felt most when walking. Reports some relief after sitting.Reports the pain is achy and rated 8/10 when the pain occurs. Denies redness or tenderness to left leg. Reports able to get shoes and socks on feet. Denies chest pain, SOB or dizziness. Appointment scheduled with Katina Dung NP on Monday 06/12/19 @9 :00 am. Advised if symptoms get worse or she develop other symptoms, to go to the ED for an evaluation. Verbalized understanding of plan.

## 2019-06-10 NOTE — Progress Notes (Signed)
Cardiology Office Note  Date: 06/12/2019   ID: [name], DOB 01-25-1951, MRN [id number]  PCP:  Lemmie Evens, MD  Cardiologist:  Carlyle Dolly, MD Electrophysiologist:  None   Chief Complaint: F/U CAD, COPD, Chronic diastolic HF, HTN, Bradycardia, Hx CVA, HLD  History of Present Illness: [name] is a 69 y.o. female with a history of NSTEMI 2017 DES to OM. PCI to RCA w/dissection, managed medically, HTN, PAF, CHB,CVA, DM2, hypothyroidism, COPD, Chronic diastolic HF with occasional LE edema.  At last visit she has c/o recent swelling in her throat with difficulty swallowing and N/V. She had been previously evaluated by other providers for this who could not determine an etiology. She had used NTG SL on 3 separate occasions for CP during the preceding 3 months with relief each time.  She recently underwent an EGD and Colonoscopy 05/10/2019 with Dr Laural Golden. EGD performed and showed no evidence in the esophagus for dysphagia. However a dilation of her entire esophagus was performed. Colonoscopy showed two small polyps in the transverse colon and in the ascending colon. Biopsied.  Five small polyps in the descending colon, in the transverse colon and at the hepatic flexure, removed with a cold snare. Resected and retrieved. Diverticulosis in the sigmoid colon. External hemorrhoids  Last seen in February 27, 2019.  Her amlodipine was decreased to 5 mg.  She was started on losartan 25 mg daily.  Her lisinopril was discontinued for suspicion of angio edema in her throat due to recent complaint throat swelling with no other explanation.  Recent phone call with [facility] RN Georgina Peer on 3/32/2021 patient stated she was having leg swelling R > L and was inquiring regarding a "leg scan".  There was mention at last visit for possible Doppler venous study to check for venous insufficiency contributing to lower extremity swelling.  Patient has not had a follow-up echocardiogram  since 01/2016 which showed grade 1 diastolic dysfunction and trivial TR.  She had a previous lower venous Doppler study August 2020 which was negative for DVT in right lower extremity.  Test was performed for right lower extremity swelling.  Patient has some right lower extremity edema versus left side. This is chronic. She had a right lower extremity venous Doppler study for DVT and August of last year which showed no evidence of DVT. Other than leg swelling patient denies any recent significant progressive anginal or exertional symptoms. She states she does have some mild dyspnea on moderate activity but attributes to having COPD. He denies any orthostatic symptoms, palpitations or arrhythmias, stroke or TIA-like symptoms, dyspeptic symptoms, or blood in stool or urine. Denies any claudication-like symptoms, DVT or PE-like symptoms. She has as needed Lasix for lower extremity edema. States she has not been taking much Lasix recently.   Past Medical History:  Diagnosis Date  . Anxiety   . Arthritis    "hands, arms, back, neck, knees, fingers" (01/21/2016)  . Atrial fibrillation (HCC)    ASA daiy; came off Coumadin shortly after stroke  . Breast cancer, left breast ([place]) 1970s; 2015  . Cancer of skin of leg    BLE  . 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HCC)    left  . Chronic lower back pain   . Chronic pain   . COPD (chronic obstructive pulmonary disease) ([place])   . Coronary artery disease   . CVA (cerebral vascular accident) [facility]) pt denies right brain CVA   Right brain secondary to right internal carotid artery  dissection for which 2 stents were placed;F/u cerebral angiography in 05/2008-minimal plaque; healing of previously identified left internal carotid artery dissection  . Depression   . GERD (gastroesophageal reflux disease)   . Heart murmur   . Hepatic cirrhosis ([place]) 12/20/2017  . Hepatitis   . History of blood transfusion    "related to spleen"  . History of hiatal hernia   . History of  kidney stones   . Hyperlipidemia   . Hypertension   . Hypothyroidism   . Kidney stone   . Melanoma of forearm, left ([place])   . Migraine    "a few migraines/year" (01/21/2016)  . NSTEMI (non-ST elevated myocardial infarction) ([place]) 01/21/2016   Archie Endo 01/21/2016  . Obesity   . Stroke [facility]) 04/2003   2 carotid artery stents in place; small left parietal and left hemispheric CVA.Phillis Knack 01/21/2016  . Tobacco use disorder    50 pack years; questionably discontinued in 2010  . Wears glasses     Past Surgical History:  Procedure Laterality Date  . ABDOMINAL HYSTERECTOMY  1978  . APPENDECTOMY    . BREAST BIOPSY Left 1970s; 2015  . BREAST LUMPECTOMY Left 1970s  . BREAST LUMPECTOMY WITH NEEDLE LOCALIZATION Left 07/07/2013   Procedure: BREAST LUMPECTOMY WITH NEEDLE LOCALIZATION;  Surgeon: Merrie Roof, MD;  Location: [place];  Service: General;  Laterality: Left;  . CARDIAC CATHETERIZATION N/A 01/22/2016   Procedure: Left Heart Cath and Coronary Angiography;  Surgeon: Peter M Martinique, MD;  Location: [place] CV [facility];  Service: Cardiovascular;  Laterality: N/A;  . CARDIAC CATHETERIZATION N/A 01/22/2016   Procedure: Coronary Stent Intervention;  Surgeon: Peter M Martinique, MD;  Location: [place] [facility];  Service: Cardiovascular;  Laterality: N/A;  . CAROTID STENT Left 04/2003   small left parietal and left hemispheric CVA/notes 07/29/2010  . CHOLECYSTECTOMY OPEN    . COLONOSCOPY  2011  . COLONOSCOPY N/A 05/10/2019   Procedure: COLONOSCOPY;  Surgeon: Rogene Houston, MD;  [facility];  Service: Endoscopy;  Laterality: N/A;  1:45  . ESOPHAGOGASTRODUODENOSCOPY  08/06/2011   Procedure: ESOPHAGOGASTRODUODENOSCOPY (EGD);  Surgeon: Rogene Houston, MD;  [facility];  Service: Endoscopy;  Laterality: N/A;  . ESOPHAGOGASTRODUODENOSCOPY N/A 05/10/2019   Procedure: ESOPHAGOGASTRODUODENOSCOPY (EGD);  Surgeon: Rogene Houston, MD;  [facility];  Service:  Endoscopy;  Laterality: N/A;  . FLEXIBLE SIGMOIDOSCOPY  08/06/2011   Procedure: FLEXIBLE SIGMOIDOSCOPY;  Surgeon: Rogene Houston, MD;  [facility];  Service: Endoscopy;  Laterality: N/A;  . INGUINAL HERNIA REPAIR Left 1970s  . LARYNX SURGERY  1970s   Polyps excised  . MALONEY DILATION  05/10/2019   Procedure: MALONEY DILATION;  Surgeon: Rogene Houston, MD;  [facility];  Service: Endoscopy;;  . MELANOMA EXCISION Left ~ 2016   forearm  . POLYPECTOMY  05/10/2019   Procedure: POLYPECTOMY;  Surgeon: Rogene Houston, MD;  [facility];  Service: Endoscopy;;  colon  . 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   spontaneous rupture    Current Outpatient Medications  Medication Sig Dispense Refill  . albuterol (PROVENTIL HFA;VENTOLIN HFA) 108 (90 Base) MCG/ACT inhaler Inhale 2 puffs into the lungs every 6 (six) hours as needed for wheezing or shortness of breath.    Marland Kitchen albuterol (PROVENTIL) (2.5 MG/3ML) 0.083% nebulizer solution Take 2.5 mg by nebulization every 6 (six) hours as needed for wheezing or shortness of breath.    Marland Kitchen amLODipine (NORVASC) 5  MG tablet Take 1 tablet (5 mg total) by mouth daily. 90 tablet 1  . aspirin EC 81 MG tablet Take 1 tablet (81 mg total) by mouth daily.    Marland Kitchen aspirin-acetaminophen-caffeine (EXCEDRIN MIGRAINE) 250-250-65 MG tablet Take 1 tablet by mouth every 6 (six) hours as needed for headache.    Marland Kitchen atorvastatin (LIPITOR) 80 MG tablet TAKE ONE TABLET BY MOUTH DAILY AT 6 P.M. 90 tablet 3  . azaTHIOprine (IMURAN) 50 MG tablet Take 1.5 tablets (75 mg total) by mouth daily. 45 tablet 6  . carvedilol (COREG) 3.125 MG tablet TAEK 1 TABLET BY MOUTH TWICE DAILY WITH MEALS. 180 tablet 0  . cetirizine (ZYRTEC) 10 MG tablet Take 10 mg by mouth daily.    . clorazepate (TRANXENE) 7.5 MG tablet Take 7.5 mg by mouth 3 (three) times daily as needed for anxiety.     . famotidine (PEPCID) 20 MG tablet Take 20 mg by mouth at bedtime.     . furosemide (LASIX) 40 MG  tablet Take 40 mg by mouth daily as needed for fluid.     Marland Kitchen glipiZIDE (GLUCOTROL) 5 MG tablet Take 1 tablet (5 mg total) by mouth daily before breakfast. 30 tablet 3  . HYDROcodone-acetaminophen (NORCO) 10-325 MG tablet Take 1 tablet by mouth 3 (three) times daily as needed for moderate pain.     . isosorbide mononitrate (ISMO,MONOKET) 20 MG tablet Take 20 mg by mouth 2 (two) times daily at 10 AM and 5 PM.    . levothyroxine (SYNTHROID, LEVOTHROID) 50 MCG tablet Take 50 mcg by mouth daily before breakfast.     . nitroGLYCERIN (NITROSTAT) 0.4 MG SL tablet Place 0.4 mg under the tongue every 5 (five) minutes as needed for chest pain.     Marland Kitchen ondansetron (ZOFRAN) 4 MG tablet Take 4 mg by mouth 3 (three) times daily as needed.    . pantoprazole (PROTONIX) 40 MG tablet Take 40 mg by mouth daily.     Marland Kitchen senna (SENOKOT) 8.6 MG tablet Take 2 tablets by mouth at bedtime.     Marland Kitchen SPIRIVA RESPIMAT 2.5 MCG/ACT AERS Inhale 2 puffs into the lungs daily as needed (shortness of breath).     . losartan (COZAAR) 50 MG tablet Take 1 tablet (50 mg total) by mouth daily. 90 tablet 1   No current facility-administered medications for this visit.   Allergies:  Gabapentin, Prednisone, Tape, and Latex   Social History: The patient  reports that she quit smoking about 11 years ago. Her smoking use included cigarettes. She has a 40.00 pack-year smoking history. She has never used smokeless tobacco. She reports that she does not drink alcohol or use drugs.   Family History: The patient's family history includes Arthritis in an other family member; Cancer in her father, sister, sister, and another family member; Dementia in her sister; Diabetes in an other family member; Heart disease in an other family member; Heart failure in her brother, father, and mother; Kidney disease in an other family member.   ROS:  Please see the history of present illness. Otherwise, complete review of systems is positive for none.  All other systems  are reviewed and negative.   Physical Exam: VS:  BP (!) 144/80   Pulse 83   Ht 5\' 3"  (1.6 m)   Wt 207 lb 9.6 oz (94.2 kg)   BMI 36.77 kg/m , BMI Body mass index is 36.77 kg/m.  Wt Readings from Last 3 Encounters:  06/12/19 207 lb 9.6  oz (94.2 kg)  05/10/19 200 lb (90.7 kg)  03/22/19 207 lb 8 oz (94.1 kg)    General: Patient appears comfortable at rest. Neck: Supple, no elevated JVP or carotid bruits, no thyromegaly. Lungs: Clear to auscultation, nonlabored breathing at rest. Cardiac: Regular rate and rhythm, no S3 or significant systolic murmur, no pericardial rub. Extremities: No pitting edema, mild RLE edema, distal pulses 2+. Skin: Warm and dry. Musculoskeletal: No kyphosis. Neuropsychiatric: Alert and oriented x3, affect grossly appropriate.  ECG:  An ECG dated 06/12/2019 was personally reviewed today and demonstrated:  Normal sinus rhythm rate of 83. No acute ST or T wave changes noted. Normal axis,  Recent Labwork: 03/22/2019: ALT 17; AST 26; BUN 11; Creat 0.69; Hemoglobin 14.7; Platelets 436; Potassium 4.1; Sodium 140     Component Value Date/Time   CHOL 119 03/27/2016 1134   TRIG 99 03/27/2016 1134   HDL 35 (L) 03/27/2016 1134   CHOLHDL 3.4 03/27/2016 1134   VLDL 20 03/27/2016 1134   [place] 64 03/27/2016 1134    Other Studies Reviewed Today:  Diagnostic Studies 01/2016 cath  The left ventricular systolic function is normal.  LV end diastolic pressure is normal.  The left ventricular ejection fraction is 55-65% by visual estimate.  Prox RCA lesion, 90 %stenosed.  1st Diag lesion, 60 %stenosed.  Prox LAD to Mid LAD lesion, 20 %stenosed.  2nd Diag lesion, 40 %stenosed.  Ost Ramus to Ramus lesion, 50 %stenosed.  1st Mrg lesion, 99 %stenosed.  A STENT SYNERGY DES 2.75X20 drug eluting stent was successfully placed.  Post intervention, there is a 0% residual stenosis.  Ost RCA to Prox RCA lesion, 90 %stenosed.  Post intervention, there is a 90%  residual stenosis with dissection from the proximal to mid RCA.  1. Severe 2 vessel obstructive CAD 2. Normal LV function and LV EDP 3. Successful stenting of the first OM with DES. This appears to be the culprit vessel. 4. Unsuccessful PCI of the RCA due to wire induced dissection of the vessel.   Plan: DAPT for one year. Aggressive BP control. Will continue IV Ntg overnight. Despite RCA dissection patient is pain free, hemodynamically stable and has a normal Ecg. If her symptoms remain stable I would treat medically and allow the RCA to heal. If she continues to have angina attempt at PCI of the RCA could be considered once the vessel has had time to heal - 6-8 weeks.    01/2016 echo Study Conclusions  - Left ventricle: The cavity size was normal. Systolic function was vigorous. The estimated ejection fraction was in the range of 65% to 70%. Wall motion was normal; there were no regional wall motion abnormalities. There was an increased relative contribution of atrial contraction to ventricular filling. Doppler parameters are consistent with abnormal left ventricular relaxation (grade 1 diastolic dysfunction). Doppler parameters are consistent with high ventricular filling pressure. - Tricuspid valve: There was trivial regurgitation. - Pulmonary arteries: PA peak pressure: 32 mm Hg (S). - Pericardium, extracardiac: A trivial, free-flowing pericardial effusion was identified along the right ventricular free wall. The fluid had no internal echoes.  Lower venous Doppler study November 14, 2018 Negative for deep vein thrombosis in right lower extremity.  Test was performed for right lower leg swelling.  Assessment and Plan:  1. CAD in native artery   2. Chronic diastolic heart failure ([place])   3. Essential hypertension   4. Mixed hyperlipidemia   5. Bilateral lower extremity edema    1. CAD  in native artery She denies any recent progressive anginal or  exertional symptoms other than mild dyspnea on moderate exertion which she attributes to history of COPD which is chronic. Continue amlodipine 5 mg daily, aspirin 81 mg daily.  2. Chronic diastolic heart failure (HCC) Last echo in 2017 showed grade 1 diastolic dysfunction and mild TR. Continue carvedilol 3.125 mg p.o. twice daily,  3. Essential hypertension Blood pressure 144/80 today. She was switched to losartan at last visit due to complaints of throat swelling with suspicion of angioedema. Increase losartan to 50 mg daily. Get a BMP in 1 to 2 weeks  4. Mixed hyperlipidemia Continue atorvastatin 80 mg p.o. daily.  5. Bilateral lower extremity edema Patient continues with some chronic right lower extremity edema. She has not been taking her Lasix as needed per her statement. Advised low-grade compression stockings bilaterally during the day. Take Lasix as needed for lower extremity swelling. Advised patient this is likely a venous insufficiency issue. Advised her that if we perform a test and she has venous insufficiency, this would require medical grade compression stockings for at least 3 months and if no success she could undergo venous ablation. Patient chooses not to have any invasive procedures if possible. She states the swelling is not symptomatic. She does state she has some leg pain at night not associated with ambulation or activity.  Medication Adjustments/Labs and Tests Ordered: Current medicines are reviewed at length with the patient today.  Concerns regarding medicines are outlined above.   Disposition: Follow-up with Dr Harl Bowie or APP 6 months  Signed, Levell July, NP 06/12/2019 12:16 PM    [facility] [place], [place], [place] [address] Phone: [phone]; Fax: [phone]

## 2019-06-12 ENCOUNTER — Encounter: Payer: Self-pay | Admitting: Family Medicine

## 2019-06-12 ENCOUNTER — Ambulatory Visit (INDEPENDENT_AMBULATORY_CARE_PROVIDER_SITE_OTHER): Payer: Medicare Other | Admitting: Family Medicine

## 2019-06-12 ENCOUNTER — Other Ambulatory Visit: Payer: Self-pay

## 2019-06-12 VITALS — BP 144/80 | HR 83 | Ht 63.0 in | Wt 207.6 lb

## 2019-06-12 DIAGNOSIS — E782 Mixed hyperlipidemia: Secondary | ICD-10-CM | POA: Diagnosis not present

## 2019-06-12 DIAGNOSIS — I10 Essential (primary) hypertension: Secondary | ICD-10-CM | POA: Diagnosis not present

## 2019-06-12 DIAGNOSIS — I2510 Atherosclerotic heart disease of native coronary artery without angina pectoris: Secondary | ICD-10-CM

## 2019-06-12 DIAGNOSIS — R600 Localized edema: Secondary | ICD-10-CM

## 2019-06-12 DIAGNOSIS — I5032 Chronic diastolic (congestive) heart failure: Secondary | ICD-10-CM | POA: Diagnosis not present

## 2019-06-12 MED ORDER — LOSARTAN POTASSIUM 50 MG PO TABS: 50.0000 mg | ORAL_TABLET | Freq: Every day | ORAL | 1 refills | Status: DC

## 2019-06-12 NOTE — Patient Instructions (Addendum)
Medication Instructions:    Your physician has recommended you make the following change in your medication:   Increase losartan to 50 mg daily. You may take (2) of your 25 mg tablets daily until they are finished  Continue other medications the same  Labwork:  Your physician recommends that you return for lab work in: one week to check your BMET.  Testing/Procedures:  NONE  Follow-Up:  Your physician recommends that you schedule a follow-up appointment in: 6 months (office). You will receive a reminder letter in the mail in about 4 months reminding you to call and schedule your appointment. If you don't receive this letter, please contact our office.  Any Other Special Instructions Will Be Listed Below (If Applicable).  If you need a refill on your cardiac medications before your next appointment, please call your pharmacy.

## 2019-06-20 ENCOUNTER — Other Ambulatory Visit: Payer: Self-pay | Admitting: Family Medicine

## 2019-06-21 ENCOUNTER — Telehealth: Payer: Self-pay | Admitting: *Deleted

## 2019-06-21 LAB — BASIC METABOLIC PANEL WITH GFR
BUN: 10 mg/dL (ref 7–25)
CO2: 30 mmol/L (ref 20–32)
Calcium: 9.7 mg/dL (ref 8.6–10.4)
Chloride: 101 mmol/L (ref 98–110)
Creat: 0.67 mg/dL (ref 0.50–0.99)
GFR, Est African American: 104 mL/min/{1.73_m2} (ref >=60)
GFR, Est Non African American: 90 mL/min/{1.73_m2} (ref >=60)
Glucose, Bld: 187 mg/dL — ABNORMAL HIGH (ref 65–139)
Potassium: 4.3 mmol/L (ref 3.5–5.3)
Sodium: 139 mmol/L (ref 135–146)

## 2019-06-21 NOTE — Telephone Encounter (Signed)
-----   Message from Verta Ellen., NP sent at 06/21/2019  8:40 AM EDT ----- Please call patient and tell her the lab work looked good except her glucose was elevated. Ask her if she was fasting. If she was fasting, this number is definitely too high. Have her follow up with her PCP if she was fasting. Her kidney function and electrolytes looked good.

## 2019-06-23 NOTE — Telephone Encounter (Signed)
Patient informed. Copy sent to PCP °

## 2019-08-24 ENCOUNTER — Ambulatory Visit: Payer: Medicare Other | Admitting: Cardiology

## 2019-08-26 ENCOUNTER — Other Ambulatory Visit: Payer: Self-pay | Admitting: Cardiology

## 2019-08-28 ENCOUNTER — Ambulatory Visit: Payer: Medicare Other | Admitting: Cardiology

## 2019-09-23 ENCOUNTER — Other Ambulatory Visit (INDEPENDENT_AMBULATORY_CARE_PROVIDER_SITE_OTHER): Payer: Self-pay | Admitting: Gastroenterology

## 2019-09-23 DIAGNOSIS — K754 Autoimmune hepatitis: Secondary | ICD-10-CM

## 2019-09-25 ENCOUNTER — Telehealth (HOSPITAL_COMMUNITY): Payer: Self-pay

## 2019-09-25 NOTE — Telephone Encounter (Signed)
Called to schedule f/u with Dr. Estanislado Pandy. Pt not seen since 2005. No answer, no vm. AW

## 2019-10-02 ENCOUNTER — Other Ambulatory Visit: Payer: Self-pay | Admitting: Cardiology

## 2019-10-09 ENCOUNTER — Ambulatory Visit (INDEPENDENT_AMBULATORY_CARE_PROVIDER_SITE_OTHER): Payer: Medicare Other | Admitting: Gastroenterology

## 2019-10-09 ENCOUNTER — Encounter (INDEPENDENT_AMBULATORY_CARE_PROVIDER_SITE_OTHER): Payer: Self-pay | Admitting: Gastroenterology

## 2019-10-09 ENCOUNTER — Other Ambulatory Visit: Payer: Self-pay

## 2019-10-09 DIAGNOSIS — K754 Autoimmune hepatitis: Secondary | ICD-10-CM | POA: Diagnosis not present

## 2019-10-09 DIAGNOSIS — R1310 Dysphagia, unspecified: Secondary | ICD-10-CM

## 2019-10-09 MED ORDER — FAMOTIDINE 20 MG PO TABS: 20.0000 mg | ORAL_TABLET | Freq: Every day | ORAL | 11 refills | Status: DC

## 2019-10-09 MED ORDER — ONDANSETRON HCL 4 MG PO TABS: 4.0000 mg | ORAL_TABLET | Freq: Three times a day (TID) | ORAL | 1 refills | Status: DC | PRN

## 2019-10-09 MED ORDER — AZATHIOPRINE 50 MG PO TABS: 75.0000 mg | ORAL_TABLET | Freq: Every day | ORAL | 6 refills | Status: DC

## 2019-10-09 MED ORDER — PANTOPRAZOLE SODIUM 40 MG PO TBEC: 40.0000 mg | DELAYED_RELEASE_TABLET | Freq: Every day | ORAL | 11 refills | Status: DC

## 2019-10-09 NOTE — Patient Instructions (Signed)
We are checking labs today and will call w/ results. Continue current medication regimen.

## 2019-10-09 NOTE — Progress Notes (Signed)
Patient profile: [name] is a 69 y.o. female seen for f/up. Last seen 03/2019 in clinic and 04/2019 for EGD/colonoscopy   History of Present Illness: [name] is seen today for f/up - she reports continued issues w/ dysphagia, she reports about 2 months of resolution of dysphagia after empiric dilation in Feb 2021. Now having dysphagia to liquids and foods again. States even water stops in mid sternal area, regurgitating water 3-4x/week. Reports can occur w/ foods but describes more severe issues w/ liquids. Feels a lot of times unable to pass water or even saliva for several minutes.  She denies any GERD sx on pepcid at night and protonix in morning. She denies nausea during day-occasionally wakes up w/ nausea and starts vomiting water, occurs average 2x/week, ongoing prior to endoscopy for several years and unchanged. States zofran resolves this. Denies late meals prior to symptoms  She is having a BM daily, no constipation/diarrhea. No rectal bleeding. Denies lower abd pain.    On baby aspirin, takes Excedrin rarely for migraine prevention, no alcohol     Wt Readings from Last 3 Encounters:  06/12/19 207 lb 9.6 oz (94.2 kg)  05/10/19 200 lb (90.7 kg)  03/22/19 207 lb 8 oz (94.1 kg)     Last Colonoscopy: 04/2019-- Two small polyps in the transverse colon and in the ascending colon. Biopsied.  - Five small polyps in the descending colon, in the  transverse colon and at the hepatic flexure, removed with a cold snare. Resected and retrieved.  - External hemorrhoids. -7 polyps, TAs, 5 year repeat    Last Endoscopy: 04/2019-- Normal esophagus.                           - Z-line regular, 39 cm from the incisors.                           - No endoscopic esophageal abnormality to explain                            patient's dysphagia. Esophagus dilated. Dilated.                           - Normal stomach.                           - Normal duodenal bulb and second  portion of the                            duodenum.                           - No specimens collected.   Past Medical History:  Past Medical History:  Diagnosis Date  . Anxiety   . Arthritis    "hands, arms, back, neck, knees, fingers" (01/21/2016)  . Atrial fibrillation (HCC)    ASA daiy; came off Coumadin shortly after stroke  . Breast cancer, left breast ([place]) 1970s; 2015  . Cancer of skin of leg    BLE  . 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HCC)    left  . Chronic lower back pain   . Chronic pain   . COPD (chronic obstructive pulmonary disease) ([place])   .  Coronary artery disease   . CVA (cerebral vascular accident) [facility]) pt denies right brain CVA   Right brain secondary to right internal carotid artery dissection for which 2 stents were placed;F/u cerebral angiography in 05/2008-minimal plaque; healing of previously identified left internal carotid artery dissection  . Depression   . GERD (gastroesophageal reflux disease)   . Heart murmur   . Hepatic cirrhosis ([place]) 12/20/2017  . Hepatitis   . History of blood transfusion    "related to spleen"  . History of hiatal hernia   . History of kidney stones   . Hyperlipidemia   . Hypertension   . Hypothyroidism   . Kidney stone   . Melanoma of forearm, left ([place])   . Migraine    "a few migraines/year" (01/21/2016)  . NSTEMI (non-ST elevated myocardial infarction) ([place]) 01/21/2016   Archie Endo 01/21/2016  . Obesity   . Stroke [facility]) 04/2003   2 carotid artery stents in place; small left parietal and left hemispheric CVA.Phillis Knack 01/21/2016  . Tobacco use disorder    50 pack years; questionably discontinued in 2010  . Wears glasses     Problem List: Patient Active Problem List   Diagnosis Date Noted  . Gastroesophageal reflux disease 03/23/2019  . Family history of colon cancer 03/23/2019  . Dysphagia 03/23/2019  . Type 2 diabetes mellitus with hyperglycemia, without long-term current use of insulin ([place])   . COPD with acute exacerbation  ([place]) 04/19/2018  . Hepatic cirrhosis ([place]) 12/20/2017  . Bradycardia 08/21/2016  . NSTEMI (non-ST elevation myocardial infarction) ([place]) 01/21/2016  . Chronic diastolic CHF (congestive heart failure) ([place]) 01/01/2016  . Symptomatic bradycardia 09/30/2015  . CVA (cerebral infarction) 09/30/2015  . 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 Essential hypertension 09/30/2015  . Chronic pain 09/30/2015  . Hyperlipidemia 09/30/2015  . Surgical menopause on hormone replacement therapy 09/30/2015  . GERD (gastroesophageal reflux disease) 09/30/2015  . Paroxysmal atrial fibrillation ([place]) 09/30/2015  . Hypothyroidism 09/30/2015  . Complete heart block ([place]) 09/30/2015  . Breast mass 04/27/2013  . Deltoid tendinitis of left shoulder 11/23/2012  . Rotator cuff tear 10/06/2012  . Rotator cuff syndrome of left shoulder 10/06/2012  . Pain in joint, shoulder region 10/06/2012  . Tendonitis, calcific, shoulder 07/26/2012  . CVA (cerebral infarction)   . COPD (chronic obstructive pulmonary disease) ([place])   . Hypertension   . Hyperlipidemia   . Obesity   . Chest tightness   . Tobacco use disorder     Past Surgical History: Past Surgical History:  Procedure Laterality Date  . ABDOMINAL HYSTERECTOMY  1978  . APPENDECTOMY    . BREAST BIOPSY Left 1970s; 2015  . BREAST LUMPECTOMY Left 1970s  . BREAST LUMPECTOMY WITH NEEDLE LOCALIZATION Left 07/07/2013   Procedure: BREAST LUMPECTOMY WITH NEEDLE LOCALIZATION;  Surgeon: Merrie Roof, MD;  Location: [place];  Service: General;  Laterality: Left;  . CARDIAC CATHETERIZATION N/A 01/22/2016   Procedure: Left Heart Cath and Coronary Angiography;  Surgeon: Peter M Martinique, MD;  Location: [place] [facility];  Service: Cardiovascular;  Laterality: N/A;  . CARDIAC CATHETERIZATION N/A 01/22/2016   Procedure: Coronary Stent Intervention;  Surgeon: Peter M Martinique, MD;  Location: [place] [facility];  Service: Cardiovascular;  Laterality: N/A;  . CAROTID  STENT Left 04/2003   small left parietal and left hemispheric CVA/notes 07/29/2010  . CHOLECYSTECTOMY OPEN    . COLONOSCOPY  2011  . COLONOSCOPY N/A 05/10/2019   Procedure: COLONOSCOPY;  Surgeon: Rogene Houston,  MD;  [facility];  Service: Endoscopy;  Laterality: N/A;  1:45  . ESOPHAGOGASTRODUODENOSCOPY  08/06/2011   Procedure: ESOPHAGOGASTRODUODENOSCOPY (EGD);  Surgeon: Rogene Houston, MD;  [facility];  Service: Endoscopy;  Laterality: N/A;  . ESOPHAGOGASTRODUODENOSCOPY N/A 05/10/2019   Procedure: ESOPHAGOGASTRODUODENOSCOPY (EGD);  Surgeon: Rogene Houston, MD;  [facility];  Service: Endoscopy;  Laterality: N/A;  . FLEXIBLE SIGMOIDOSCOPY  08/06/2011   Procedure: FLEXIBLE SIGMOIDOSCOPY;  Surgeon: Rogene Houston, MD;  [facility];  Service: Endoscopy;  Laterality: N/A;  . INGUINAL HERNIA REPAIR Left 1970s  . LARYNX SURGERY  1970s   Polyps excised  . MALONEY DILATION  05/10/2019   Procedure: MALONEY DILATION;  Surgeon: Rogene Houston, MD;  [facility];  Service: Endoscopy;;  . MELANOMA EXCISION Left ~ 2016   forearm  . POLYPECTOMY  05/10/2019   Procedure: POLYPECTOMY;  Surgeon: Rogene Houston, MD;  [facility];  Service: Endoscopy;;  colon  . 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   spontaneous rupture    Allergies: Allergies  Allergen Reactions  . Gabapentin Nausea And Vomiting  . Prednisone Palpitations  . Tape Other (See Comments)    Causes blisters to form  . Latex Rash and Other (See Comments)    Blisters      Home Medications:  Current Outpatient Medications:  .  albuterol (PROVENTIL HFA;VENTOLIN HFA) 108 (90 Base) MCG/ACT inhaler, Inhale 2 puffs into the lungs every 6 (six) hours as needed for wheezing or shortness of breath., Disp: , Rfl:  .  albuterol (PROVENTIL) (2.5 MG/3ML) 0.083% nebulizer solution, Take 2.5 mg by nebulization every 6 (six) hours as needed for wheezing or shortness of breath., Disp: ,  Rfl:  .  amLODipine (NORVASC) 5 MG tablet, TAKE 1 TABLET BY MOUTH ONCE DAILY., Disp: 90 tablet, Rfl: 1 .  aspirin EC 81 MG tablet, Take 1 tablet (81 mg total) by mouth daily., Disp:  , Rfl:  .  aspirin-acetaminophen-caffeine (EXCEDRIN MIGRAINE) 250-250-65 MG tablet, Take 1 tablet by mouth every 6 (six) hours as needed for headache., Disp: , Rfl:  .  atorvastatin (LIPITOR) 80 MG tablet, TAKE ONE TABLET BY MOUTH DAILY AT 6 P.M., Disp: 90 tablet, Rfl: 3 .  azaTHIOprine (IMURAN) 50 MG tablet, Take 1.5 tablets (75 mg total) by mouth daily., Disp: 45 tablet, Rfl: 6 .  carvedilol (COREG) 3.125 MG tablet, TAEK 1 TABLET BY MOUTH TWICE DAILY WITH MEALS., Disp: 180 tablet, Rfl: 3 .  cetirizine (ZYRTEC) 10 MG tablet, Take 10 mg by mouth daily., Disp: , Rfl:  .  clorazepate (TRANXENE) 7.5 MG tablet, Take 7.5 mg by mouth 3 (three) times daily as needed for anxiety. , Disp: , Rfl:  .  famotidine (PEPCID) 20 MG tablet, Take 1 tablet (20 mg total) by mouth at bedtime., Disp: 30 tablet, Rfl: 11 .  glipiZIDE (GLUCOTROL) 5 MG tablet, Take 1 tablet (5 mg total) by mouth daily before breakfast., Disp: 30 tablet, Rfl: 3 .  HYDROcodone-acetaminophen (NORCO) 10-325 MG tablet, Take 1 tablet by mouth 3 (three) times daily as needed for moderate pain. , Disp: , Rfl:  .  isosorbide mononitrate (ISMO,MONOKET) 20 MG tablet, Take 20 mg by mouth 2 (two) times daily at 10 AM and 5 PM., Disp: , Rfl:  .  levothyroxine (SYNTHROID, LEVOTHROID) 50 MCG tablet, Take 50 mcg by mouth daily before breakfast. , Disp: , Rfl:  .  losartan (COZAAR) 50 MG tablet, Take 1  tablet (50 mg total) by mouth daily., Disp: 90 tablet, Rfl: 1 .  pantoprazole (PROTONIX) 40 MG tablet, Take 1 tablet (40 mg total) by mouth daily., Disp: 30 tablet, Rfl: 11 .  senna (SENOKOT) 8.6 MG tablet, Take 2 tablets by mouth at bedtime. , Disp: , Rfl:  .  SPIRIVA RESPIMAT 2.5 MCG/ACT AERS, Inhale 2 puffs into the lungs daily as needed (shortness of breath). , Disp: , Rfl:  .   furosemide (LASIX) 40 MG tablet, Take 40 mg by mouth daily as needed for fluid.  (Patient not taking: Reported on 10/09/2019), Disp: , Rfl:  .  nitroGLYCERIN (NITROSTAT) 0.4 MG SL tablet, Place 0.4 mg under the tongue every 5 (five) minutes as needed for chest pain.  (Patient not taking: Reported on 10/09/2019), Disp: , Rfl:  .  ondansetron (ZOFRAN) 4 MG tablet, Take 1 tablet (4 mg total) by mouth 3 (three) times daily as needed., Disp: 30 tablet, Rfl: 1   Family History: family history includes Arthritis in an other family member; Cancer in her father, sister, sister, and another family member; Dementia in her sister; Diabetes in an other family member; Heart disease in an other family member; Heart failure in her brother, father, and mother; Kidney disease in an other family member.    Social History:   reports that she quit smoking about 12 years ago. Her smoking use included cigarettes. She has a 40.00 pack-year smoking history. She has never used smokeless tobacco. She reports that she does not drink alcohol and does not use drugs.   Review of Systems: Constitutional: Denies weight loss/weight gain  Eyes: No changes in vision. ENT: No oral lesions, sore throat.  GI: see HPI.  Heme/Lymph: No easy bruising.  CV: No chest pain.  GU: No hematuria.  Integumentary: No rashes.  Neuro: No headaches.  Psych: No depression/anxiety.  Endocrine: No heat/cold intolerance.  Allergic/Immunologic: No urticaria.  Resp: No cough, SOB.  Musculoskeletal: No joint swelling.    Physical Examination: There were no vitals taken for this visit. Gen: NAD, alert and oriented x 4 HEENT: PEERLA, EOMI, Neck: supple, no JVD Chest: CTA bilaterally, no wheezes, crackles, or other adventitious sounds CV: RRR, no m/g/c/r Abd: soft, NT, ND, +BS in all four quadrants; no HSM, guarding, ridigity, or rebound tenderness Ext: no edema, well perfused with 2+ pulses, Skin: no rash or lesions noted on observed  skin Lymph: no noted LAD  Data Reviewed:  03/2019-- US/Fibroscan - Median kPa: 2.8 kPa Echogenic liver which could represent fatty infiltration or cirrhosis unchanged. Post splenectomy and cholecystectomy. Small splenule at splenic bed.   Barium swallow Oct 2020.  IMPRESSION: Small sliding-type hiatal hernia. No other significant abnormality seen in the esophagus.   Labs January 2021-INR 1.0, 81, CBC normal except platelets 436, CMP w/ normal LFTS, normal mitochondrial smooth muscle panel. Negative ANA. 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  Assessment/Plan: Ms. Poster is a 69 y.o. female    Lloyd was seen today for follow-up.  Diagnoses and all orders for this visit:  Dysphagia, unspecified type  Autoimmune hepatitis ([place]) -     azaTHIOprine (IMURAN) 50 MG tablet; Take 1.5 tablets (75 mg total) by mouth daily. -     COMPLETE METABOLIC PANEL WITH GFR -     CBC with Differential  Other orders -     pantoprazole (PROTONIX) 40 MG tablet; Take 1  tablet (40 mg total) by mouth daily. -     famotidine (PEPCID) 20 MG tablet; Take 1 tablet (20 mg total) by mouth at bedtime. -     ondansetron (ZOFRAN) 4 MG tablet; Take 1 tablet (4 mg total) by mouth 3 (three) times daily as needed.    1.  Dysphagia-empirically improved after dilation for 2 months but symptoms have returned.  Describes liquid dysphagia so may have some underlying dysmotility.  She had a barium swallow last year that was unremarkable.  Will discuss care further care and review barium swallow with Dr. Laural Golden  2. 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  She had a FibroScan as a follow-up that was normal.  We will continue treating with azathioprine 75mg  daily.  Her last labs in January revealed normal mitochondrial,  smooth muscle antibodies and ANA.  3.  GERD-continue regimen of Protonix he 21 Pepcid in the evening and occasional Zofran for nausea.  Recent upper endoscopy as above.   F/up 6 months for LFTS  I personally performed the service, non-incident to. (WP)  Laurine Blazer, [facility]

## 2019-10-10 LAB — COMPLETE METABOLIC PANEL WITH GFR
AG Ratio: 1.6 (calc) (ref 1.0–2.5)
ALT: 20 U/L (ref 6–29)
AST: 25 U/L (ref 10–35)
Albumin: 4 g/dL (ref 3.6–5.1)
Alkaline phosphatase (APISO): 94 U/L (ref 37–153)
BUN: 11 mg/dL (ref 7–25)
CO2: 30 mmol/L (ref 20–32)
Calcium: 9.4 mg/dL (ref 8.6–10.4)
Chloride: 103 mmol/L (ref 98–110)
Creat: 0.72 mg/dL (ref 0.50–0.99)
GFR, Est African American: 99 mL/min/{1.73_m2} (ref >=60)
GFR, Est Non African American: 85 mL/min/{1.73_m2} (ref >=60)
Globulin: 2.5 g/dL (calc) (ref 1.9–3.7)
Glucose, Bld: 154 mg/dL — ABNORMAL HIGH (ref 65–139)
Potassium: 4.3 mmol/L (ref 3.5–5.3)
Sodium: 139 mmol/L (ref 135–146)
Total Bilirubin: 0.4 mg/dL (ref 0.2–1.2)
Total Protein: 6.5 g/dL (ref 6.1–8.1)

## 2019-10-10 LAB — CBC WITH DIFFERENTIAL/PLATELET
Absolute Monocytes: 830 cells/uL (ref 200–950)
Basophils Absolute: 79 cells/uL (ref 0–200)
Basophils Relative: 1 %
Eosinophils Absolute: 174 cells/uL (ref 15–500)
Eosinophils Relative: 2.2 %
HCT: 43 % (ref 35.0–45.0)
Hemoglobin: 14.8 g/dL (ref 11.7–15.5)
Lymphs Abs: 2394 cells/uL (ref 850–3900)
MCH: 30.4 pg (ref 27.0–33.0)
MCHC: 34.4 g/dL (ref 32.0–36.0)
MCV: 88.3 fL (ref 80.0–100.0)
MPV: 10 fL (ref 7.5–12.5)
Monocytes Relative: 10.5 %
Neutro Abs: 4424 cells/uL (ref 1500–7800)
Neutrophils Relative %: 56 %
Platelets: 408 10*3/uL — ABNORMAL HIGH (ref 140–400)
RBC: 4.87 10*6/uL (ref 3.80–5.10)
RDW: 12.9 % (ref 11.0–15.0)
Total Lymphocyte: 30.3 %
WBC: 7.9 10*3/uL (ref 3.8–10.8)

## 2019-10-11 ENCOUNTER — Other Ambulatory Visit (INDEPENDENT_AMBULATORY_CARE_PROVIDER_SITE_OTHER): Payer: Self-pay | Admitting: Gastroenterology

## 2019-10-11 DIAGNOSIS — R1310 Dysphagia, unspecified: Secondary | ICD-10-CM

## 2019-10-11 NOTE — Progress Notes (Signed)
Barium swallow order placed in chart.

## 2019-10-16 ENCOUNTER — Other Ambulatory Visit: Payer: Self-pay

## 2019-10-16 ENCOUNTER — Ambulatory Visit (HOSPITAL_COMMUNITY)
Admission: RE | Admit: 2019-10-16 | Discharge: 2019-10-16 | Disposition: A | Discharge status: Home / Self Care | Payer: Medicare Other | Source: Ambulatory Visit | Attending: Gastroenterology | Admitting: Gastroenterology

## 2019-10-16 ENCOUNTER — Other Ambulatory Visit (INDEPENDENT_AMBULATORY_CARE_PROVIDER_SITE_OTHER): Payer: Self-pay | Admitting: Gastroenterology

## 2019-10-16 DIAGNOSIS — R1310 Dysphagia, unspecified: Secondary | ICD-10-CM | POA: Diagnosis not present

## 2019-10-17 ENCOUNTER — Telehealth (INDEPENDENT_AMBULATORY_CARE_PROVIDER_SITE_OTHER): Payer: Self-pay | Admitting: Gastroenterology

## 2019-10-17 NOTE — Telephone Encounter (Signed)
Patient called regarding labs and test results - please advise - ph# [phone]

## 2019-10-18 NOTE — Telephone Encounter (Signed)
I called patient with results and discussed with her.  Has esophageal dysmotility.  She will continue small bites, plenty of liquids with eating, etc.  She will contact me if symptoms worsen.  Will also plan to review barium images w/ Dr Laural Golden    IMPRESSION: Age-related esophageal dysmotility. Laryngeal penetration and mild vallecular residuals without aspiration

## 2019-10-19 ENCOUNTER — Telehealth (INDEPENDENT_AMBULATORY_CARE_PROVIDER_SITE_OTHER): Payer: Self-pay | Admitting: Gastroenterology

## 2019-10-19 NOTE — Telephone Encounter (Signed)
Records have been faxed.

## 2019-10-19 NOTE — Telephone Encounter (Signed)
Patient left voice mail message stating she would like for her xrays and lab work to be faxed to Dr Vickey Sages office

## 2019-10-19 NOTE — Telephone Encounter (Signed)
Ann-she asked her lab results be faxed to Dr. Felipa Eth office.  Thanks.

## 2019-10-20 NOTE — Telephone Encounter (Signed)
Labs have been faxed.

## 2019-11-08 ENCOUNTER — Other Ambulatory Visit: Payer: Self-pay | Admitting: Cardiology

## 2019-11-22 ENCOUNTER — Other Ambulatory Visit (INDEPENDENT_AMBULATORY_CARE_PROVIDER_SITE_OTHER): Payer: Self-pay | Admitting: Gastroenterology

## 2019-11-22 DIAGNOSIS — K754 Autoimmune hepatitis: Secondary | ICD-10-CM

## 2019-11-23 ENCOUNTER — Other Ambulatory Visit (HOSPITAL_COMMUNITY): Payer: Self-pay | Admitting: Family Medicine

## 2019-11-27 ENCOUNTER — Ambulatory Visit: Payer: Medicare Other | Admitting: Cardiology

## 2019-11-29 ENCOUNTER — Other Ambulatory Visit (HOSPITAL_COMMUNITY): Payer: Self-pay | Admitting: Family Medicine

## 2019-11-29 DIAGNOSIS — N63 Unspecified lump in unspecified breast: Secondary | ICD-10-CM

## 2019-12-12 ENCOUNTER — Other Ambulatory Visit: Payer: Self-pay

## 2019-12-12 ENCOUNTER — Ambulatory Visit (HOSPITAL_COMMUNITY)
Admission: RE | Admit: 2019-12-12 | Discharge: 2019-12-12 | Disposition: A | Discharge status: Home / Self Care | Payer: Medicare Other | Source: Ambulatory Visit | Attending: Family Medicine | Admitting: Family Medicine

## 2019-12-12 DIAGNOSIS — N6320 Unspecified lump in the left breast, unspecified quadrant: Secondary | ICD-10-CM | POA: Insufficient documentation

## 2019-12-12 DIAGNOSIS — N63 Unspecified lump in unspecified breast: Secondary | ICD-10-CM

## 2019-12-12 DIAGNOSIS — N644 Mastodynia: Secondary | ICD-10-CM | POA: Insufficient documentation

## 2019-12-18 ENCOUNTER — Other Ambulatory Visit: Payer: Self-pay | Admitting: Cardiology

## 2019-12-19 ENCOUNTER — Other Ambulatory Visit: Payer: Self-pay | Admitting: Cardiology

## 2019-12-19 ENCOUNTER — Telehealth: Payer: Self-pay | Admitting: Cardiology

## 2019-12-19 MED ORDER — LOSARTAN POTASSIUM 50 MG PO TABS
50.0000 mg | ORAL_TABLET | Freq: Every day | ORAL | 1 refills | Status: DC
Start: 2019-12-19 — End: 2020-06-20

## 2019-12-19 MED ORDER — LOSARTAN POTASSIUM 50 MG PO TABS
50.0000 mg | ORAL_TABLET | Freq: Every day | ORAL | 1 refills | Status: DC
Start: 2019-12-19 — End: 2019-12-19

## 2019-12-19 NOTE — Telephone Encounter (Signed)
New message      *STAT* If patient is at the pharmacy, call can be transferred to refill team.   1. Which medications need to be refilled? (please list name of each medication and dose if known)   losartan (COZAAR) 50 MG tablet     2. Which pharmacy/location (including street and city if local pharmacy) is medication to be sent to? [facility]   3. Do they need a 30 day or 90 day supply? [place]

## 2019-12-19 NOTE — Telephone Encounter (Signed)
  Patient Consent for Virtual Visit         [name] has provided verbal consent on 12/19/2019 for a virtual visit (video or telephone).   CONSENT FOR VIRTUAL VISIT FOR:  [name]  By participating in this virtual visit I agree to the following:  I hereby voluntarily request, consent and authorize [place] and its employed or contracted physicians, physician assistants, nurse practitioners or other licensed health care professionals (the Practitioner), to provide me with telemedicine health care services (the "Services") as deemed necessary by the treating Practitioner. I acknowledge and consent to receive the Services by the Practitioner via telemedicine. I understand that the telemedicine visit will involve communicating with the Practitioner through live audiovisual communication technology and the disclosure of certain medical information by electronic transmission. I acknowledge that I have been given the opportunity to request an in-person assessment or other available alternative prior to the telemedicine visit and am voluntarily participating in the telemedicine visit.  I understand that I have the right to withhold or withdraw my consent to the use of telemedicine in the course of my care at any time, without affecting my right to future care or treatment, and that the Practitioner or I may terminate the telemedicine visit at any time. I understand that I have the right to inspect all information obtained and/or recorded in the course of the telemedicine visit and may receive copies of available information for a reasonable fee.  I understand that some of the potential risks of receiving the Services via telemedicine include:  Marland Kitchen Delay or interruption in medical evaluation due to technological equipment failure or disruption; . Information transmitted may not be sufficient (e.g. poor resolution of images) to allow for appropriate medical decision making by the  Practitioner; and/or  . In rare instances, security protocols could fail, causing a breach of personal health information.  Furthermore, I acknowledge that it is my responsibility to provide information about my medical history, conditions and care that is complete and accurate to the best of my ability. I acknowledge that Practitioner's advice, recommendations, and/or decision may be based on factors not within their control, such as incomplete or inaccurate data provided by me or distortions of diagnostic images or specimens that may result from electronic transmissions. I understand that the practice of medicine is not an exact science and that Practitioner makes no warranties or guarantees regarding treatment outcomes. I acknowledge that a copy of this consent can be made available to me via my patient portal ([place]), or I can request a printed copy by calling the office of [facility].    I understand that my insurance will be billed for this visit.   I have read or had this consent read to me. . I understand the contents of this consent, which adequately explains the benefits and risks of the Services being provided via telemedicine.  . I have been provided ample opportunity to ask questions regarding this consent and the Services and have had my questions answered to my satisfaction. . I give my informed consent for the services to be provided through the use of telemedicine i

## 2019-12-19 NOTE — Telephone Encounter (Signed)
Refilled losartan to [facility]

## 2019-12-20 ENCOUNTER — Other Ambulatory Visit: Payer: Self-pay

## 2019-12-20 ENCOUNTER — Encounter: Payer: Self-pay | Admitting: Cardiology

## 2019-12-20 ENCOUNTER — Other Ambulatory Visit: Payer: Self-pay | Admitting: Cardiology

## 2019-12-20 ENCOUNTER — Telehealth (INDEPENDENT_AMBULATORY_CARE_PROVIDER_SITE_OTHER): Payer: Medicare Other | Admitting: Cardiology

## 2019-12-20 VITALS — BP 167/100 | HR 77 | Ht 64.0 in | Wt 267.0 lb

## 2019-12-20 DIAGNOSIS — I10 Essential (primary) hypertension: Secondary | ICD-10-CM | POA: Diagnosis not present

## 2019-12-20 DIAGNOSIS — Z01818 Encounter for other preprocedural examination: Secondary | ICD-10-CM

## 2019-12-20 DIAGNOSIS — I25119 Atherosclerotic heart disease of native coronary artery with unspecified angina pectoris: Secondary | ICD-10-CM | POA: Diagnosis not present

## 2019-12-20 DIAGNOSIS — R0789 Other chest pain: Secondary | ICD-10-CM

## 2019-12-20 MED ORDER — AMLODIPINE BESYLATE 10 MG PO TABS: 10.0000 mg | ORAL_TABLET | Freq: Every day | ORAL | 3 refills | Status: DC

## 2019-12-20 MED ORDER — SODIUM CHLORIDE 0.9% FLUSH: 3.0000 mL | Freq: Two times a day (BID) | INTRAVENOUS | Status: DC

## 2019-12-20 NOTE — Patient Instructions (Signed)
Medication Instructions:  Your physician has recommended you make the following change in your medication:   Increase Norvasc to 10 mg Daily   *If you need a refill on your cardiac medications before your next appointment, please call your pharmacy*   Lab Work: Your physician recommends that you return for lab work in: Wed. October 13  If you have labs (blood work) drawn today and your tests are completely normal, you will receive your results only by:  [place] (if you have MyChart) OR  A paper copy in the mail If you have any lab test that is abnormal or we need to change your treatment, we will call you to review the results.   Testing/Procedures: Your physician has requested that you have a cardiac catheterization. Cardiac catheterization is used to diagnose and/or treat various heart conditions. Doctors may recommend this procedure for a number of different reasons. The most common reason is to evaluate chest pain. Chest pain can be a symptom of coronary artery disease (CAD), and cardiac catheterization can show whether plaque is narrowing or blocking your hearts arteries. This procedure is also used to evaluate the valves, as well as measure the blood flow and oxygen levels in different parts of your heart. For further information please visit [email or web address].[email or web address]. Please follow instruction sheet, as given.  Follow-Up: At [facility], you and your health needs are our priority.  As part of our continuing mission to provide you with exceptional heart care, we have created designated Provider Care Teams.  These Care Teams include your primary Cardiologist (physician) and Advanced Practice Providers (APPs -  Physician Assistants and Nurse Practitioners) who all work together to provide you with the care you need, when you need it.  We recommend signing up for the patient portal called "MyChart".  Sign up information is provided on this After Visit Summary.  MyChart is used to  connect with patients for Virtual Visits (Telemedicine).  Patients are able to view lab/test results, encounter notes, upcoming appointments, etc.  Non-urgent messages can be sent to your provider as well.   To learn more about what you can do with MyChart, go to [email or web address].    Your next appointment:   3 week(s)  The format for your next appointment:   In Person  Provider:   You will see one of the following Advanced Practice Providers on your designated Care Team:    Bernerd Pho, PA-C   Ermalinda Barrios, PA-C   Other Instructions Thank you for choosing [facility]!  [address] [place] [address] Dept: [phone] Loc: [place]  12/20/2019  You are scheduled for a Cardiac Catheterization on Friday, October 15 with Dr. Larae Grooms.  1. Please arrive at the [facility] (Main Entrance A) at [facility]: [address] [place], [place] [address] at 5:30 AM (This time is two hours before your procedure to ensure your preparation). Free valet parking service is available.   Special note: Every effort is made to have your procedure done on time. Please understand that emergencies sometimes delay scheduled procedures.  2. Diet: Do not eat solid foods after midnight.  The patient may have clear liquids until 5am upon the day of the procedure.  3. Labs: You will need to have blood drawn on Wednesday, October 13 at [address], [place]  Open: 7am - 6pm, Sat 8am - 12  noon   Phone: [phone]. You do not need to be fasting.  4. Medication instructions in preparation for your procedure:   Contrast Allergy: No  Stop taking, Cozaar (Losartan) Friday, October 15, you may resume taking after.   On the morning of your procedure, take your Aspirin and any morning medicines NOT listed above.  You may use sips of  water.  5. Plan for one night stay--bring personal belongings. 6. Bring a current list of your medications and current insurance cards. 7. You MUST have a responsible person to drive you home. 8. Someone MUST be with you the first 24 hours after you arrive home or your discharge will be delayed. 9. Please wear clothes that are easy to get on and off and wear slip-on shoes.  Thank you for allowing [name] to care for you!   -- [facility]

## 2019-12-20 NOTE — H&P (View-Only) (Signed)
Virtual Visit via Telephone Note   This visit type was conducted due to national recommendations for restrictions regarding the COVID-19 Pandemic (e.g. social distancing) in an effort to limit this patient's exposure and mitigate transmission in our community.  Due to her co-morbid illnesses, this patient is at least at moderate risk for complications without adequate follow up.  This format is felt to be most appropriate for this patient at this time.  The patient did not have access to video technology/had technical difficulties with video requiring transitioning to audio format only (telephone).  All issues noted in this document were discussed and addressed.  No physical exam could be performed with this format.  Please refer to the patient's chart for her  consent to telehealth for [facility]    Date:  12/20/2019   ID:  [name], DOB [birth year], MRN [id number] The patient was identified using 2 identifiers.  Patient Location: Home Provider Location: Office/Clinic  PCP:  Lemmie Evens, MD  Cardiologist:  Carlyle Dolly, MD  Electrophysiologist:  None   Evaluation Performed:  Follow-Up Visit  Chief Complaint:  Follow up visit  History of Present Illness:    [name] is a 69 y.o. female seen today for follow up of the following medical problems. This is a focsued visit on history of CAD and recent chest pains, for more detailed history please see prior clinic notes.   1. CAD - admit 01/2016 with NSTEMI. Full cath report below. Received DES to OM. Unsuccesful PCI to RCA with wire induced dissection, managed medically. The OM was thought to be the culprit. If recurring symptoms can consider repeat RCA intervention. .  - some recent chest pains - has used NG 5-6 times.  - sharp pain mid to left chest, down to arm. Up to 10/10 in severity, within 5 minutes NG improved.  - got diaphoretic - can occur at rest or with activity - 4 episodes over the last  month.  - reports recent high family stress    SH: granddaughter just started at [employer].   Has had covid vaccine moderna x 2.   The patient does not have symptoms concerning for COVID-19 infection (fever, chills, cough, or new shortness of breath).    Past Medical History:  Diagnosis Date  . Anxiety   . Arthritis    "hands, arms, back, neck, knees, fingers" (01/21/2016)  . Atrial fibrillation (HCC)    ASA daiy; came off Coumadin shortly after stroke  . Breast cancer, left breast ([place]) 1970s; 2015  . Cancer of skin of leg    BLE  . 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HCC)    left  . Chronic lower back pain   . Chronic pain   . COPD (chronic obstructive pulmonary disease) ([place])   . Coronary artery disease   . CVA (cerebral vascular accident) [facility]) pt denies right brain CVA   Right brain secondary to right internal carotid artery dissection for which 2 stents were placed;F/u cerebral angiography in 05/2008-minimal plaque; healing of previously identified left internal carotid artery dissection  . Depression   . GERD (gastroesophageal reflux disease)   . Heart murmur   . Hepatic cirrhosis ([place]) 12/20/2017  . Hepatitis   . History of blood transfusion    "related to spleen"  . History of hiatal hernia   . History of kidney stones   . Hyperlipidemia   . Hypertension   . Hypothyroidism   . Kidney stone   . Melanoma of  forearm, left ([place])   . Migraine    "a few migraines/year" (01/21/2016)  . NSTEMI (non-ST elevated myocardial infarction) ([place]) 01/21/2016   Archie Endo 01/21/2016  . Obesity   . Stroke [facility]) 04/2003   2 carotid artery stents in place; small left parietal and left hemispheric CVA.Phillis Knack 01/21/2016  . Tobacco use disorder    50 pack years; questionably discontinued in 2010  . Wears glasses    Past Surgical History:  Procedure Laterality Date  . ABDOMINAL HYSTERECTOMY  1978  . APPENDECTOMY    . BREAST BIOPSY Left 1970s; 2015  . BREAST LUMPECTOMY Left 1970s   . BREAST LUMPECTOMY WITH NEEDLE LOCALIZATION Left 07/07/2013   Procedure: BREAST LUMPECTOMY WITH NEEDLE LOCALIZATION;  Surgeon: Merrie Roof, MD;  Location: [place];  Service: General;  Laterality: Left;  . CARDIAC CATHETERIZATION N/A 01/22/2016   Procedure: Left Heart Cath and Coronary Angiography;  Surgeon: Peter M Martinique, MD;  Location: [place] CV LAB;  Service: Cardiovascular;  Laterality: N/A;  . CARDIAC CATHETERIZATION N/A 01/22/2016   Procedure: Coronary Stent Intervention;  Surgeon: Peter M Martinique, MD;  Location: [place] CV LAB;  Service: Cardiovascular;  Laterality: N/A;  . CAROTID STENT Left 04/2003   small left parietal and left hemispheric CVA/notes 07/29/2010  . CHOLECYSTECTOMY OPEN    . COLONOSCOPY  2011  . COLONOSCOPY N/A 05/10/2019   Procedure: COLONOSCOPY;  Surgeon: Rogene Houston, MD;  [facility];  Service: Endoscopy;  Laterality: N/A;  1:45  . ESOPHAGOGASTRODUODENOSCOPY  08/06/2011   Procedure: ESOPHAGOGASTRODUODENOSCOPY (EGD);  Surgeon: Rogene Houston, MD;  [facility];  Service: Endoscopy;  Laterality: N/A;  . ESOPHAGOGASTRODUODENOSCOPY N/A 05/10/2019   Procedure: ESOPHAGOGASTRODUODENOSCOPY (EGD);  Surgeon: Rogene Houston, MD;  [facility];  Service: Endoscopy;  Laterality: N/A;  . FLEXIBLE SIGMOIDOSCOPY  08/06/2011   Procedure: FLEXIBLE SIGMOIDOSCOPY;  Surgeon: Rogene Houston, MD;  [facility];  Service: Endoscopy;  Laterality: N/A;  . INGUINAL HERNIA REPAIR Left 1970s  . LARYNX SURGERY  1970s   Polyps excised  . MALONEY DILATION  05/10/2019   Procedure: MALONEY DILATION;  Surgeon: Rogene Houston, MD;  [facility];  Service: Endoscopy;;  . MELANOMA EXCISION Left ~ 2016   forearm  . POLYPECTOMY  05/10/2019   Procedure: POLYPECTOMY;  Surgeon: Rogene Houston, MD;  [facility];  Service: Endoscopy;;  colon  . 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   spontaneous rupture     No  outpatient medications have been marked as taking for the 12/20/19 encounter (Appointment) with Arnoldo Lenis, MD.     Allergies:   Gabapentin, Prednisone, Tape, and Latex   Social History   Tobacco Use  . Smoking status: Former Smoker    Packs/day: 1.00    Years: 40.00    Pack years: 40.00    Types: Cigarettes    Quit date: 07/06/2007    Years since quitting: 12.4  . Smokeless tobacco: Never Used  Vaping Use  . Vaping Use: Never used  Substance Use Topics  . Alcohol use: No    Alcohol/week: 0.0 standard drinks  . Drug use: No     Family Hx: The patient's family history includes Arthritis in an other family member; Cancer in her father, sister, sister, and another family member; Dementia in her sister; Diabetes in an other family member; Heart disease in an other family member; Heart failure in her brother, father, and mother;  Kidney disease in an other family member.  ROS:   Please see the history of present illness.     All other systems reviewed and are negative.   Prior CV studies:   The following studies were reviewed today:  01/2016 cath  The left ventricular systolic function is normal.  LV end diastolic pressure is normal.  The left ventricular ejection fraction is 55-65% by visual estimate.  Prox RCA lesion, 90 %stenosed.  1st Diag lesion, 60 %stenosed.  Prox LAD to Mid LAD lesion, 20 %stenosed.  2nd Diag lesion, 40 %stenosed.  Ost Ramus to Ramus lesion, 50 %stenosed.  1st Mrg lesion, 99 %stenosed.  A STENT SYNERGY DES 2.75X20 drug eluting stent was successfully placed.  Post intervention, there is a 0% residual stenosis.  Ost RCA to Prox RCA lesion, 90 %stenosed.  Post intervention, there is a 90% residual stenosis with dissection from the proximal to mid RCA.  1. Severe 2 vessel obstructive CAD 2. Normal LV function and LV EDP 3. Successful stenting of the first OM with DES. This appears to be the culprit vessel. 4. Unsuccessful PCI of  the RCA due to wire induced dissection of the vessel.   Plan: DAPT for one year. Aggressive BP control. Will continue IV Ntg overnight. Despite RCA dissection patient is pain free, hemodynamically stable and has a normal Ecg. If her symptoms remain stable I would treat medically and allow the RCA to heal. If she continues to have angina attempt at PCI of the RCA could be considered once the vessel has had time to heal - 6-8 weeks.    01/2016 echo Study Conclusions  - Left ventricle: The cavity size was normal. Systolic function was vigorous. The estimated ejection fraction was in the range of 65% to 70%. Wall motion was normal; there were no regional wall motion abnormalities. There was an increased relative contribution of atrial contraction to ventricular filling. Doppler parameters are consistent with abnormal left ventricular relaxation (grade 1 diastolic dysfunction). Doppler parameters are consistent with high ventricular filling pressure. - Tricuspid valve: There was trivial regurgitation. - Pulmonary arteries: PA peak pressure: 32 mm Hg (S). - Pericardium, extracardiac: A trivial, free-flowing pericardial effusion was identified along the right ventricular free wall. The fluid had no internal echoes.   Labs/Other Tests and Data Reviewed:    EKG:  No ECG reviewed.  Recent Labs: 10/10/2019: ALT 20; BUN 11; Creat 0.72; Hemoglobin 14.8; Platelets 408; Potassium 4.3; Sodium 139   Recent Lipid Panel Lab Results  Component Value Date/Time   CHOL 119 03/27/2016 11:34 AM   TRIG 99 03/27/2016 11:34 AM   HDL 35 (L) 03/27/2016 11:34 AM   CHOLHDL 3.4 03/27/2016 11:34 AM   LDLCALC 64 03/27/2016 11:34 AM    Wt Readings from Last 3 Encounters:  06/12/19 207 lb 9.6 oz (94.2 kg)  05/10/19 200 lb (90.7 kg)  03/22/19 207 lb 8 oz (94.1 kg)     Objective:    Vital Signs:   Today's Vitals   12/20/19 0923  BP: (!) 167/100  Pulse: 77  Weight: 267 lb (121.1  kg)  Height: 5\' 4"  (1.626 m)   Body mass index is 45.83 kg/m. Normal affect. Normal speech pattern and tone. Comfortable, no apparent distress. No audible signs of sob or wheezing.   ASSESSMENT & PLAN:    1. CAD - had done very well for some time without symptoms -recent chest pains symptoms concerning for recurrent angina. Similar in character to prior angina, resolved  with SL NG.  - given very typical description of anginal symptoms and her prior history would proceed with cath for further evaluation - increase norvasc to 10mg  daily for additional antianginal effects.   2.HTN - above goal, increase norvasc to 10mg  daily.    I have reviewed the risks, indications, and alternatives to cardiac catheterization, possible angioplasty, and stenting with the patient  today. Risks include but are not limited to bleeding, infection, vascular injury, stroke, myocardial infection, arrhythmia, kidney injury, radiation-related injury in the case of prolonged fluoroscopy use, emergency cardiac surgery, and death. The patient understands the risks of serious complication is 1-2 in 7169 with diagnostic cardiac cath and 1-2% or less with angioplasty/stenting.   COVID-19 Education: The signs and symptoms of COVID-19 were discussed with the patient and how to seek care for testing (follow up with PCP or arrange E-visit).  The importance of social distancing was discussed today.  Time:   Today, I have spent 18 minutes with the patient with telehealth technology discussing the above problems.     Medication Adjustments/Labs and Tests Ordered: Current medicines are reviewed at length with the patient today.  Concerns regarding medicines are outlined above.   Tests Ordered: No orders of the defined types were placed in this encounter.   Medication Changes: No orders of the defined types were placed in this encounter.   Follow Up:  In Person in 1 month(s)  Signed, Carlyle Dolly, MD  12/20/2019  9:20 AM    [place]

## 2019-12-20 NOTE — Progress Notes (Signed)
Virtual Visit via Telephone Note   This visit type was conducted due to national recommendations for restrictions regarding the COVID-19 Pandemic (e.g. social distancing) in an effort to limit this patient's exposure and mitigate transmission in our community.  Due to her co-morbid illnesses, this patient is at least at moderate risk for complications without adequate follow up.  This format is felt to be most appropriate for this patient at this time.  The patient did not have access to video technology/had technical difficulties with video requiring transitioning to audio format only (telephone).  All issues noted in this document were discussed and addressed.  No physical exam could be performed with this format.  Please refer to the patient's chart for her  consent to telehealth for [facility].    Date:  12/20/2019   ID:  [name], DOB 1950/05/31, MRN [id number] The patient was identified using 2 identifiers.  Patient Location: Home Provider Location: Office/Clinic  PCP:  Lemmie Evens, MD  Cardiologist:  Carlyle Dolly, MD  Electrophysiologist:  None   Evaluation Performed:  Follow-Up Visit  Chief Complaint:  Follow up visit  History of Present Illness:    [name] is a 68 y.o. female seen today for follow up of the following medical problems. This is a focsued visit on history of CAD and recent chest pains, for more detailed history please see prior clinic notes.   1. CAD - admit 01/2016 with NSTEMI. Full cath report below. Received DES to OM. Unsuccesful PCI to RCA with wire induced dissection, managed medically. The OM was thought to be the culprit. If recurring symptoms can consider repeat RCA intervention. .  - some recent chest pains - has used NG 5-6 times.  - sharp pain mid to left chest, down to arm. Up to 10/10 in severity, within 5 minutes NG improved.  - got diaphoretic - can occur at rest or with activity - 4 episodes over the last  month.  - reports recent high family stress    SH: granddaughter just started at [employer].   Has had covid vaccine moderna x 2.   The patient does not have symptoms concerning for COVID-19 infection (fever, chills, cough, or new shortness of breath).    Past Medical History:  Diagnosis Date  . Anxiety   . Arthritis    "hands, arms, back, neck, knees, fingers" (01/21/2016)  . Atrial fibrillation (HCC)    ASA daiy; came off Coumadin shortly after stroke  . Breast cancer, left breast ([place]) 1970s; 2015  . Cancer of skin of leg    BLE  . 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HCC)    left  . Chronic lower back pain   . Chronic pain   . COPD (chronic obstructive pulmonary disease) ([place])   . Coronary artery disease   . CVA (cerebral vascular accident) [facility]) pt denies right brain CVA   Right brain secondary to right internal carotid artery dissection for which 2 stents were placed;F/u cerebral angiography in 05/2008-minimal plaque; healing of previously identified left internal carotid artery dissection  . Depression   . GERD (gastroesophageal reflux disease)   . Heart murmur   . Hepatic cirrhosis ([place]) 12/20/2017  . Hepatitis   . History of blood transfusion    "related to spleen"  . History of hiatal hernia   . History of kidney stones   . Hyperlipidemia   . Hypertension   . Hypothyroidism   . Kidney stone   . Melanoma of  forearm, left ([place])   . Migraine    "a few migraines/year" (01/21/2016)  . NSTEMI (non-ST elevated myocardial infarction) ([place]) 01/21/2016   Archie Endo 01/21/2016  . Obesity   . Stroke [facility]) 04/2003   2 carotid artery stents in place; small left parietal and left hemispheric CVA.Phillis Knack 01/21/2016  . Tobacco use disorder    50 pack years; questionably discontinued in 2010  . Wears glasses    Past Surgical History:  Procedure Laterality Date  . ABDOMINAL HYSTERECTOMY  1978  . APPENDECTOMY    . BREAST BIOPSY Left 1970s; 2015  . BREAST LUMPECTOMY Left 1970s    . BREAST LUMPECTOMY WITH NEEDLE LOCALIZATION Left 07/07/2013   Procedure: BREAST LUMPECTOMY WITH NEEDLE LOCALIZATION;  Surgeon: Merrie Roof, MD;  Location: [place];  Service: General;  Laterality: Left;  . CARDIAC CATHETERIZATION N/A 01/22/2016   Procedure: Left Heart Cath and Coronary Angiography;  Surgeon: Peter M Martinique, MD;  Location: [place] [facility];  Service: Cardiovascular;  Laterality: N/A;  . CARDIAC CATHETERIZATION N/A 01/22/2016   Procedure: Coronary Stent Intervention;  Surgeon: Peter M Martinique, MD;  Location: [place] [facility];  Service: Cardiovascular;  Laterality: N/A;  . CAROTID STENT Left 04/2003   small left parietal and left hemispheric CVA/notes 07/29/2010  . CHOLECYSTECTOMY OPEN    . COLONOSCOPY  2011  . COLONOSCOPY N/A 05/10/2019   Procedure: COLONOSCOPY;  Surgeon: Rogene Houston, MD;  [facility];  Service: Endoscopy;  Laterality: N/A;  1:45  . ESOPHAGOGASTRODUODENOSCOPY  08/06/2011   Procedure: ESOPHAGOGASTRODUODENOSCOPY (EGD);  Surgeon: Rogene Houston, MD;  [facility];  Service: Endoscopy;  Laterality: N/A;  . ESOPHAGOGASTRODUODENOSCOPY N/A 05/10/2019   Procedure: ESOPHAGOGASTRODUODENOSCOPY (EGD);  Surgeon: Rogene Houston, MD;  [facility];  Service: Endoscopy;  Laterality: N/A;  . FLEXIBLE SIGMOIDOSCOPY  08/06/2011   Procedure: FLEXIBLE SIGMOIDOSCOPY;  Surgeon: Rogene Houston, MD;  [facility];  Service: Endoscopy;  Laterality: N/A;  . INGUINAL HERNIA REPAIR Left 1970s  . LARYNX SURGERY  1970s   Polyps excised  . MALONEY DILATION  05/10/2019   Procedure: MALONEY DILATION;  Surgeon: Rogene Houston, MD;  [facility];  Service: Endoscopy;;  . MELANOMA EXCISION Left ~ 2016   forearm  . POLYPECTOMY  05/10/2019   Procedure: POLYPECTOMY;  Surgeon: Rogene Houston, MD;  [facility];  Service: Endoscopy;;  colon  . 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   spontaneous rupture     No  outpatient medications have been marked as taking for the 12/20/19 encounter (Appointment) with Arnoldo Lenis, MD.     Allergies:   Gabapentin, Prednisone, Tape, and Latex   Social History   Tobacco Use  . Smoking status: Former Smoker    Packs/day: 1.00    Years: 40.00    Pack years: 40.00    Types: Cigarettes    Quit date: 07/06/2007    Years since quitting: 12.4  . Smokeless tobacco: Never Used  Vaping Use  . Vaping Use: Never used  Substance Use Topics  . Alcohol use: No    Alcohol/week: 0.0 standard drinks  . Drug use: No     Family Hx: The patient's family history includes Arthritis in an other family member; Cancer in her father, sister, sister, and another family member; Dementia in her sister; Diabetes in an other family member; Heart disease in an other family member; Heart failure in her brother, father, and  mother; Kidney disease in an other family member.  ROS:   Please see the history of present illness.     All other systems reviewed and are negative.   Prior CV studies:   The following studies were reviewed today:  01/2016 cath  The left ventricular systolic function is normal.  LV end diastolic pressure is normal.  The left ventricular ejection fraction is 55-65% by visual estimate.  Prox RCA lesion, 90 %stenosed.  1st Diag lesion, 60 %stenosed.  Prox LAD to Mid LAD lesion, 20 %stenosed.  2nd Diag lesion, 40 %stenosed.  Ost Ramus to Ramus lesion, 50 %stenosed.  1st Mrg lesion, 99 %stenosed.  A STENT SYNERGY DES 2.75X20 drug eluting stent was successfully placed.  Post intervention, there is a 0% residual stenosis.  Ost RCA to Prox RCA lesion, 90 %stenosed.  Post intervention, there is a 90% residual stenosis with dissection from the proximal to mid RCA.  1. Severe 2 vessel obstructive CAD 2. Normal LV function and LV EDP 3. Successful stenting of the first OM with DES. This appears to be the culprit vessel. 4. Unsuccessful PCI of  the RCA due to wire induced dissection of the vessel.   Plan: DAPT for one year. Aggressive BP control. Will continue IV Ntg overnight. Despite RCA dissection patient is pain free, hemodynamically stable and has a normal Ecg. If her symptoms remain stable I would treat medically and allow the RCA to heal. If she continues to have angina attempt at PCI of the RCA could be considered once the vessel has had time to heal - 6-8 weeks.    01/2016 echo Study Conclusions  - Left ventricle: The cavity size was normal. Systolic function was vigorous. The estimated ejection fraction was in the range of 65% to 70%. Wall motion was normal; there were no regional wall motion abnormalities. There was an increased relative contribution of atrial contraction to ventricular filling. Doppler parameters are consistent with abnormal left ventricular relaxation (grade 1 diastolic dysfunction). Doppler parameters are consistent with high ventricular filling pressure. - Tricuspid valve: There was trivial regurgitation. - Pulmonary arteries: PA peak pressure: 32 mm Hg (S). - Pericardium, extracardiac: A trivial, free-flowing pericardial effusion was identified along the right ventricular free wall. The fluid had no internal echoes.   Labs/Other Tests and Data Reviewed:    EKG:  No ECG reviewed.  Recent Labs: 10/10/2019: ALT 20; BUN 11; Creat 0.72; Hemoglobin 14.8; Platelets 408; Potassium 4.3; Sodium 139   Recent Lipid Panel Lab Results  Component Value Date/Time   CHOL 119 03/27/2016 11:34 AM   TRIG 99 03/27/2016 11:34 AM   HDL 35 (L) 03/27/2016 11:34 AM   CHOLHDL 3.4 03/27/2016 11:34 AM   LDLCALC 64 03/27/2016 11:34 AM    Wt Readings from Last 3 Encounters:  06/12/19 207 lb 9.6 oz (94.2 kg)  05/10/19 200 lb (90.7 kg)  03/22/19 207 lb 8 oz (94.1 kg)     Objective:    Vital Signs:   Today's Vitals   12/20/19 0923  BP: (!) 167/100  Pulse: 77  Weight: 267 lb (121.1  kg)  Height: 5\' 4"  (1.626 m)   Body mass index is 45.83 kg/m. Normal affect. Normal speech pattern and tone. Comfortable, no apparent distress. No audible signs of sob or wheezing.   ASSESSMENT & PLAN:    1. CAD - had done very well for some time without symptoms -recent chest pains symptoms concerning for recurrent angina. Similar in character to prior angina,  resolved with SL NG.  - given very typical description of anginal symptoms and her prior history would proceed with cath for further evaluation - increase norvasc to 10mg  daily for additional antianginal effects.   2.HTN - above goal, increase norvasc to 10mg  daily.    I have reviewed the risks, indications, and alternatives to cardiac catheterization, possible angioplasty, and stenting with the patient  today. Risks include but are not limited to bleeding, infection, vascular injury, stroke, myocardial infection, arrhythmia, kidney injury, radiation-related injury in the case of prolonged fluoroscopy use, emergency cardiac surgery, and death. The patient understands the risks of serious complication is 1-2 in 1610 with diagnostic cardiac cath and 1-2% or less with angioplasty/stenting.   COVID-19 Education: The signs and symptoms of COVID-19 were discussed with the patient and how to seek care for testing (follow up with PCP or arrange E-visit).  The importance of social distancing was discussed today.  Time:   Today, I have spent 18 minutes with the patient with telehealth technology discussing the above problems.     Medication Adjustments/Labs and Tests Ordered: Current medicines are reviewed at length with the patient today.  Concerns regarding medicines are outlined above.   Tests Ordered: No orders of the defined types were placed in this encounter.   Medication Changes: No orders of the defined types were placed in this encounter.   Follow Up:  In Person in 1 month(s)  Signed, Carlyle Dolly, MD  12/20/2019  9:20 AM    [place]

## 2019-12-20 NOTE — Addendum Note (Signed)
Addended by: Levonne Hubert on: 12/20/2019 11:21 AM   Modules accepted: Orders

## 2019-12-22 ENCOUNTER — Other Ambulatory Visit (INDEPENDENT_AMBULATORY_CARE_PROVIDER_SITE_OTHER): Payer: Self-pay | Admitting: Gastroenterology

## 2019-12-22 DIAGNOSIS — K754 Autoimmune hepatitis: Secondary | ICD-10-CM

## 2019-12-27 ENCOUNTER — Other Ambulatory Visit (HOSPITAL_COMMUNITY)
Admission: RE | Admit: 2019-12-27 | Discharge: 2019-12-27 | Disposition: A | Discharge status: Home / Self Care | Payer: Medicare Other | Source: Ambulatory Visit | Attending: Family Medicine | Admitting: Family Medicine

## 2019-12-27 ENCOUNTER — Other Ambulatory Visit: Payer: Self-pay

## 2019-12-27 ENCOUNTER — Other Ambulatory Visit (HOSPITAL_COMMUNITY): Payer: Medicare Other

## 2019-12-27 ENCOUNTER — Other Ambulatory Visit (HOSPITAL_COMMUNITY)
Admission: RE | Admit: 2019-12-27 | Discharge: 2019-12-27 | Disposition: A | Discharge status: Home / Self Care | Payer: Medicare Other | Source: Ambulatory Visit | Attending: Interventional Cardiology | Admitting: Interventional Cardiology

## 2019-12-27 DIAGNOSIS — Z20822 Contact with and (suspected) exposure to covid-19: Secondary | ICD-10-CM | POA: Insufficient documentation

## 2019-12-27 DIAGNOSIS — Z01812 Encounter for preprocedural laboratory examination: Secondary | ICD-10-CM | POA: Diagnosis present

## 2019-12-27 DIAGNOSIS — I10 Essential (primary) hypertension: Secondary | ICD-10-CM | POA: Insufficient documentation

## 2019-12-27 DIAGNOSIS — I25119 Atherosclerotic heart disease of native coronary artery with unspecified angina pectoris: Secondary | ICD-10-CM | POA: Insufficient documentation

## 2019-12-27 DIAGNOSIS — Z01818 Encounter for other preprocedural examination: Secondary | ICD-10-CM | POA: Insufficient documentation

## 2019-12-27 LAB — BASIC METABOLIC PANEL
Anion gap: 7 (ref 5–15)
BUN: 13 mg/dL (ref 8–23)
CO2: 30 mmol/L (ref 22–32)
Calcium: 9.4 mg/dL (ref 8.9–10.3)
Chloride: 100 mmol/L (ref 98–111)
Creatinine, Ser: 0.67 mg/dL (ref 0.44–1.00)
GFR, Estimated: >60 mL/min (ref >60)
Glucose, Bld: 116 mg/dL — ABNORMAL HIGH (ref 70–99)
Potassium: 3.4 mmol/L — ABNORMAL LOW (ref 3.5–5.1)
Sodium: 137 mmol/L (ref 135–145)

## 2019-12-27 LAB — CBC
HCT: 43.2 % (ref 36.0–46.0)
Hemoglobin: 14.7 g/dL (ref 12.0–15.0)
MCH: 30.2 pg (ref 26.0–34.0)
MCHC: 34 g/dL (ref 30.0–36.0)
MCV: 88.9 fL (ref 80.0–100.0)
Platelets: 425 10*3/uL — ABNORMAL HIGH (ref 150–400)
RBC: 4.86 MIL/uL (ref 3.87–5.11)
RDW: 13.7 % (ref 11.5–15.5)
WBC: 8.7 10*3/uL (ref 4.0–10.5)
nRBC: 0 % (ref 0.0–0.2)

## 2019-12-27 LAB — SARS CORONAVIRUS 2 (TAT 6-24 HRS): SARS Coronavirus 2: NEGATIVE

## 2019-12-28 ENCOUNTER — Telehealth: Payer: Self-pay | Admitting: *Deleted

## 2019-12-28 NOTE — Telephone Encounter (Signed)
Pt contacted pre-catheterization scheduled at [facility] for: Friday December 29, 2019 7:30 AM Verified arrival time and place: [facility]) at: 5:30 AM   No solid food after midnight prior to cath, clear liquids until 5 AM day of procedure.  Hold: Glucotrol -AM of procedure Lasix-AM of procedure  Except hold medications AM meds can be  taken pre-cath with sips of water including: ASA 81 mg   Confirmed patient has responsible adult to drive home post procedure and be with patient first 24 hours after arriving home: yes  You are allowed ONE visitor in the waiting room during the time you are at the hospital for your procedure. Both you and your visitor must wear a mask once you enter the hospital.       COVID-19 Pre-Screening Questions:   In the past 14 days have you had a new cough, new headache, new nasal congestion, fever (100.4 or greater) unexplained body aches, new sore throat, or sudden loss of taste or sense of smell? no  In the past 14 days have you been around anyone with known Covid 19? no  Have you been vaccinated for COVID-19? Yes, see immunization history  Reviewed procedure/mask/visitor instructions, COVID-19 questions reviewed with patient.

## 2019-12-29 ENCOUNTER — Encounter (HOSPITAL_COMMUNITY)
Admission: RE | Disposition: A | Discharge status: Home / Self Care | Payer: Medicare Other | Source: Home / Self Care | Attending: Interventional Cardiology

## 2019-12-29 ENCOUNTER — Ambulatory Visit (HOSPITAL_COMMUNITY)
Admission: RE | Admit: 2019-12-29 | Discharge: 2019-12-29 | Disposition: A | Discharge status: Home / Self Care | Payer: Medicare Other | Attending: Interventional Cardiology | Admitting: Interventional Cardiology

## 2019-12-29 DIAGNOSIS — Z7982 Long term (current) use of aspirin: Secondary | ICD-10-CM | POA: Diagnosis not present

## 2019-12-29 DIAGNOSIS — K7460 Unspecified cirrhosis of liver: Secondary | ICD-10-CM | POA: Diagnosis not present

## 2019-12-29 DIAGNOSIS — Z79890 Hormone replacement therapy (postmenopausal): Secondary | ICD-10-CM | POA: Insufficient documentation

## 2019-12-29 DIAGNOSIS — I4891 Unspecified atrial fibrillation: Secondary | ICD-10-CM | POA: Diagnosis not present

## 2019-12-29 DIAGNOSIS — Z6841 Body Mass Index (BMI) 40.0 and over, adult: Secondary | ICD-10-CM | POA: Diagnosis not present

## 2019-12-29 DIAGNOSIS — E119 Type 2 diabetes mellitus without complications: Secondary | ICD-10-CM | POA: Insufficient documentation

## 2019-12-29 DIAGNOSIS — Z853 Personal history of malignant neoplasm of breast: Secondary | ICD-10-CM | POA: Insufficient documentation

## 2019-12-29 DIAGNOSIS — E785 Hyperlipidemia, unspecified: Secondary | ICD-10-CM | POA: Diagnosis not present

## 2019-12-29 DIAGNOSIS — I25118 Atherosclerotic heart disease of native coronary artery with other forms of angina pectoris: Secondary | ICD-10-CM

## 2019-12-29 DIAGNOSIS — I25119 Atherosclerotic heart disease of native coronary artery with unspecified angina pectoris: Secondary | ICD-10-CM | POA: Insufficient documentation

## 2019-12-29 DIAGNOSIS — F329 Major depressive disorder, single episode, unspecified: Secondary | ICD-10-CM | POA: Diagnosis not present

## 2019-12-29 DIAGNOSIS — J449 Chronic obstructive pulmonary disease, unspecified: Secondary | ICD-10-CM | POA: Insufficient documentation

## 2019-12-29 DIAGNOSIS — Z7984 Long term (current) use of oral hypoglycemic drugs: Secondary | ICD-10-CM | POA: Diagnosis not present

## 2019-12-29 DIAGNOSIS — I10 Essential (primary) hypertension: Secondary | ICD-10-CM | POA: Diagnosis not present

## 2019-12-29 DIAGNOSIS — Z955 Presence of coronary angioplasty implant and graft: Secondary | ICD-10-CM | POA: Insufficient documentation

## 2019-12-29 DIAGNOSIS — Z888 Allergy status to other drugs, medicaments and biological substances status: Secondary | ICD-10-CM | POA: Diagnosis not present

## 2019-12-29 DIAGNOSIS — F419 Anxiety disorder, unspecified: Secondary | ICD-10-CM | POA: Diagnosis not present

## 2019-12-29 DIAGNOSIS — E669 Obesity, unspecified: Secondary | ICD-10-CM | POA: Diagnosis not present

## 2019-12-29 DIAGNOSIS — Z79899 Other long term (current) drug therapy: Secondary | ICD-10-CM | POA: Diagnosis not present

## 2019-12-29 DIAGNOSIS — Z8673 Personal history of transient ischemic attack (TIA), and cerebral infarction without residual deficits: Secondary | ICD-10-CM | POA: Insufficient documentation

## 2019-12-29 DIAGNOSIS — E039 Hypothyroidism, unspecified: Secondary | ICD-10-CM | POA: Insufficient documentation

## 2019-12-29 DIAGNOSIS — I252 Old myocardial infarction: Secondary | ICD-10-CM | POA: Diagnosis not present

## 2019-12-29 DIAGNOSIS — K219 Gastro-esophageal reflux disease without esophagitis: Secondary | ICD-10-CM | POA: Insufficient documentation

## 2019-12-29 DIAGNOSIS — R0789 Other chest pain: Secondary | ICD-10-CM

## 2019-12-29 DIAGNOSIS — Z87891 Personal history of nicotine dependence: Secondary | ICD-10-CM | POA: Diagnosis not present

## 2019-12-29 DIAGNOSIS — I2510 Atherosclerotic heart disease of native coronary artery without angina pectoris: Secondary | ICD-10-CM

## 2019-12-29 HISTORY — PX: CORONARY STENT INTERVENTION: CATH118234

## 2019-12-29 HISTORY — PX: CORONARY ATHERECTOMY: CATH118238

## 2019-12-29 HISTORY — PX: INTRAVASCULAR ULTRASOUND/IVUS: CATH118244

## 2019-12-29 HISTORY — PX: LEFT HEART CATH AND CORONARY ANGIOGRAPHY: CATH118249

## 2019-12-29 HISTORY — PX: TEMPORARY PACEMAKER: CATH118268

## 2019-12-29 LAB — POCT ACTIVATED CLOTTING TIME
Activated Clotting Time: 268 seconds
Activated Clotting Time: 472 seconds
Activated Clotting Time: 323 seconds
Activated Clotting Time: 367 seconds
Activated Clotting Time: 175 seconds

## 2019-12-29 LAB — GLUCOSE, CAPILLARY
Glucose-Capillary: 93 mg/dL (ref 70–99)
Glucose-Capillary: 124 mg/dL — ABNORMAL HIGH (ref 70–99)

## 2019-12-29 LAB — BASIC METABOLIC PANEL
Anion gap: 11 (ref 5–15)
BUN: 10 mg/dL (ref 8–23)
CO2: 26 mmol/L (ref 22–32)
Calcium: 9.2 mg/dL (ref 8.9–10.3)
Chloride: 102 mmol/L (ref 98–111)
Creatinine, Ser: 0.64 mg/dL (ref 0.44–1.00)
GFR, Estimated: >60 mL/min (ref >60)
Glucose, Bld: 104 mg/dL — ABNORMAL HIGH (ref 70–99)
Potassium: 3.4 mmol/L — ABNORMAL LOW (ref 3.5–5.1)
Sodium: 139 mmol/L (ref 135–145)

## 2019-12-29 SURGERY — LEFT HEART CATH AND CORONARY ANGIOGRAPHY
Anesthesia: LOCAL

## 2019-12-29 MED ORDER — HEPARIN SODIUM (PORCINE) 1000 UNIT/ML IJ SOLN
INTRAMUSCULAR | Status: AC
  Filled 2019-12-29: qty 1

## 2019-12-29 MED ORDER — FENTANYL CITRATE (PF) 100 MCG/2ML IJ SOLN
INTRAMUSCULAR | Status: AC
  Filled 2019-12-29: qty 2

## 2019-12-29 MED ORDER — ASPIRIN-ACETAMINOPHEN-CAFFEINE 250-250-65 MG PO TABS: 1.0000 | ORAL_TABLET | Freq: Four times a day (QID) | ORAL | Status: DC | PRN

## 2019-12-29 MED ORDER — MIDAZOLAM HCL 2 MG/2ML IJ SOLN
INTRAMUSCULAR | Status: AC
  Filled 2019-12-29: qty 2

## 2019-12-29 MED ORDER — LEVOTHYROXINE SODIUM 50 MCG PO TABS: 50.0000 ug | ORAL_TABLET | Freq: Every day | ORAL | Status: DC

## 2019-12-29 MED ORDER — SENNOSIDES 8.6 MG PO TABS: 2.0000 | ORAL_TABLET | Freq: Every day | ORAL | Status: DC

## 2019-12-29 MED ORDER — TIROFIBAN HCL IN NACL 5-0.9 MG/100ML-% IV SOLN
INTRAVENOUS | Status: AC
  Filled 2019-12-29: qty 100

## 2019-12-29 MED ORDER — SODIUM CHLORIDE 0.9 % WEIGHT BASED INFUSION
3.0000 mL/kg/h | INTRAVENOUS | Status: AC
  Administered 2019-12-29: 3 mL/kg/h via INTRAVENOUS

## 2019-12-29 MED ORDER — CARVEDILOL 3.125 MG PO TABS: 3.1250 mg | ORAL_TABLET | Freq: Two times a day (BID) | ORAL | Status: DC

## 2019-12-29 MED ORDER — ASPIRIN EC 81 MG PO TBEC: 81.0000 mg | DELAYED_RELEASE_TABLET | Freq: Every day | ORAL | Status: DC

## 2019-12-29 MED ORDER — LIDOCAINE HCL (PF) 1 % IJ SOLN
INTRAMUSCULAR | Status: DC | PRN
  Administered 2019-12-29: 2 mL
  Administered 2019-12-29: 14 mL

## 2019-12-29 MED ORDER — ALBUTEROL SULFATE HFA 108 (90 BASE) MCG/ACT IN AERS: 2.0000 | INHALATION_SPRAY | Freq: Four times a day (QID) | RESPIRATORY_TRACT | Status: DC | PRN

## 2019-12-29 MED ORDER — CLORAZEPATE DIPOTASSIUM 7.5 MG PO TABS
7.5000 mg | ORAL_TABLET | Freq: Three times a day (TID) | ORAL | Status: DC | PRN
  Administered 2019-12-29: 7.5 mg via ORAL
  Filled 2019-12-29 (×2): qty 1

## 2019-12-29 MED ORDER — TIROFIBAN (AGGRASTAT) BOLUS VIA INFUSION
INTRAVENOUS | Status: DC | PRN
  Administered 2019-12-29: 2235 ug via INTRAVENOUS

## 2019-12-29 MED ORDER — CLOPIDOGREL BISULFATE 300 MG PO TABS
ORAL_TABLET | ORAL | Status: DC | PRN
  Administered 2019-12-29: 600 mg via ORAL

## 2019-12-29 MED ORDER — MIDAZOLAM HCL 2 MG/2ML IJ SOLN
INTRAMUSCULAR | Status: DC | PRN
  Administered 2019-12-29: 2 mg via INTRAVENOUS
  Administered 2019-12-29 (×5): 1 mg via INTRAVENOUS

## 2019-12-29 MED ORDER — FENTANYL CITRATE (PF) 100 MCG/2ML IJ SOLN
INTRAMUSCULAR | Status: DC | PRN
Start: 2019-12-29 — End: 2019-12-29
  Administered 2019-12-29 (×6): 25 ug via INTRAVENOUS

## 2019-12-29 MED ORDER — NITROGLYCERIN 1 MG/10 ML FOR IR/CATH LAB
INTRA_ARTERIAL | Status: AC
  Filled 2019-12-29: qty 10

## 2019-12-29 MED ORDER — VIPERSLIDE LUBRICANT OPTIME
TOPICAL | Status: DC | PRN
  Administered 2019-12-29: 20 mL via SURGICAL_CAVITY

## 2019-12-29 MED ORDER — SODIUM CHLORIDE 0.9% FLUSH: 3.0000 mL | INTRAVENOUS | Status: DC | PRN

## 2019-12-29 MED ORDER — CLOPIDOGREL BISULFATE 75 MG PO TABS: 75.0000 mg | ORAL_TABLET | Freq: Every day | ORAL | 11 refills | Status: DC

## 2019-12-29 MED ORDER — ONDANSETRON HCL 4 MG/2ML IJ SOLN: 4.0000 mg | Freq: Four times a day (QID) | INTRAMUSCULAR | Status: DC | PRN

## 2019-12-29 MED ORDER — ATORVASTATIN CALCIUM 80 MG PO TABS: 80.0000 mg | ORAL_TABLET | Freq: Every day | ORAL | Status: DC

## 2019-12-29 MED ORDER — LOSARTAN POTASSIUM 50 MG PO TABS: 50.0000 mg | ORAL_TABLET | Freq: Every day | ORAL | Status: DC

## 2019-12-29 MED ORDER — HYDROCODONE-ACETAMINOPHEN 5-325 MG PO TABS
ORAL_TABLET | ORAL | Status: AC
  Filled 2019-12-29: qty 2

## 2019-12-29 MED ORDER — PANTOPRAZOLE SODIUM 40 MG PO TBEC: 40.0000 mg | DELAYED_RELEASE_TABLET | Freq: Every day | ORAL | Status: DC

## 2019-12-29 MED ORDER — VERAPAMIL HCL 2.5 MG/ML IV SOLN
INTRAVENOUS | Status: DC | PRN
  Administered 2019-12-29 (×2): 200 ug via INTRACORONARY

## 2019-12-29 MED ORDER — LABETALOL HCL 5 MG/ML IV SOLN
10.0000 mg | INTRAVENOUS | Status: DC | PRN
  Administered 2019-12-29: 10 mg via INTRAVENOUS

## 2019-12-29 MED ORDER — TIROFIBAN HCL IN NACL 5-0.9 MG/100ML-% IV SOLN
INTRAVENOUS | Status: DC | PRN
  Administered 2019-12-29: 0.15 ug/kg/min via INTRAVENOUS

## 2019-12-29 MED ORDER — FUROSEMIDE 40 MG PO TABS: 40.0000 mg | ORAL_TABLET | Freq: Every day | ORAL | Status: DC | PRN

## 2019-12-29 MED ORDER — NITROGLYCERIN 1 MG/10 ML FOR IR/CATH LAB
INTRA_ARTERIAL | Status: DC | PRN
  Administered 2019-12-29 (×7): 200 ug via INTRACORONARY

## 2019-12-29 MED ORDER — ACETAMINOPHEN 325 MG PO TABS: 650.0000 mg | ORAL_TABLET | ORAL | Status: DC | PRN

## 2019-12-29 MED ORDER — ASPIRIN 81 MG PO CHEW: 81.0000 mg | CHEWABLE_TABLET | ORAL | Status: DC

## 2019-12-29 MED ORDER — SODIUM CHLORIDE 0.9 % IV SOLN
INTRAVENOUS | Status: AC | PRN
  Administered 2019-12-29: 10 mL/h via INTRAVENOUS

## 2019-12-29 MED ORDER — SODIUM CHLORIDE 0.9 % WEIGHT BASED INFUSION: 1.0000 mL/kg/h | INTRAVENOUS | Status: DC

## 2019-12-29 MED ORDER — HYDROCODONE-ACETAMINOPHEN 10-325 MG PO TABS
1.0000 | ORAL_TABLET | Freq: Three times a day (TID) | ORAL | Status: DC | PRN
Start: 2019-12-29 — End: 2019-12-29

## 2019-12-29 MED ORDER — VERAPAMIL HCL 2.5 MG/ML IV SOLN
INTRAVENOUS | Status: AC
  Filled 2019-12-29: qty 2

## 2019-12-29 MED ORDER — FENTANYL CITRATE (PF) 100 MCG/2ML IJ SOLN
INTRAMUSCULAR | Status: DC | PRN
Start: 2019-12-29 — End: 2019-12-29
  Administered 2019-12-29: 50 ug via INTRAVENOUS

## 2019-12-29 MED ORDER — HEPARIN (PORCINE) IN NACL 1000-0.9 UT/500ML-% IV SOLN
INTRAVENOUS | Status: DC | PRN
  Administered 2019-12-29 (×2): 500 mL

## 2019-12-29 MED ORDER — VERAPAMIL HCL 2.5 MG/ML IV SOLN
INTRAVENOUS | Status: DC | PRN
  Administered 2019-12-29: 10 mL via INTRA_ARTERIAL

## 2019-12-29 MED ORDER — SODIUM CHLORIDE 0.9% FLUSH: 3.0000 mL | Freq: Two times a day (BID) | INTRAVENOUS | Status: DC

## 2019-12-29 MED ORDER — ONDANSETRON HCL 4 MG PO TABS: 4.0000 mg | ORAL_TABLET | Freq: Three times a day (TID) | ORAL | Status: DC | PRN

## 2019-12-29 MED ORDER — HEPARIN SODIUM (PORCINE) 1000 UNIT/ML IJ SOLN
INTRAMUSCULAR | Status: DC | PRN
  Administered 2019-12-29: 4500 [IU] via INTRAVENOUS
  Administered 2019-12-29: 4000 [IU] via INTRAVENOUS
  Administered 2019-12-29: 6000 [IU] via INTRAVENOUS

## 2019-12-29 MED ORDER — SODIUM CHLORIDE 0.9 % IV SOLN: 250.0000 mL | INTRAVENOUS | Status: DC | PRN

## 2019-12-29 MED ORDER — ASPIRIN 81 MG PO CHEW: 81.0000 mg | CHEWABLE_TABLET | Freq: Every day | ORAL | Status: DC

## 2019-12-29 MED ORDER — LIDOCAINE HCL (PF) 1 % IJ SOLN
INTRAMUSCULAR | Status: AC
  Filled 2019-12-29: qty 30

## 2019-12-29 MED ORDER — CLOPIDOGREL BISULFATE 75 MG PO TABS: 75.0000 mg | ORAL_TABLET | Freq: Every day | ORAL | Status: DC

## 2019-12-29 MED ORDER — FAMOTIDINE 20 MG PO TABS: 20.0000 mg | ORAL_TABLET | Freq: Every day | ORAL | Status: DC

## 2019-12-29 MED ORDER — LORATADINE 10 MG PO TABS: 10.0000 mg | ORAL_TABLET | Freq: Every day | ORAL | Status: DC

## 2019-12-29 MED ORDER — FLUMAZENIL 1 MG/10ML IV SOLN
INTRAVENOUS | Status: AC
  Filled 2019-12-29: qty 10

## 2019-12-29 MED ORDER — IOHEXOL 350 MG/ML SOLN
INTRAVENOUS | Status: DC | PRN
  Administered 2019-12-29: 190 mL

## 2019-12-29 MED ORDER — ISOSORBIDE MONONITRATE 20 MG PO TABS: 20.0000 mg | ORAL_TABLET | Freq: Two times a day (BID) | ORAL | Status: DC

## 2019-12-29 MED ORDER — SODIUM CHLORIDE 0.9 % IV SOLN: INTRAVENOUS | Status: DC

## 2019-12-29 MED ORDER — CLOPIDOGREL BISULFATE 300 MG PO TABS
ORAL_TABLET | ORAL | Status: AC
  Filled 2019-12-29: qty 2

## 2019-12-29 MED ORDER — ALBUTEROL SULFATE (2.5 MG/3ML) 0.083% IN NEBU: 2.5000 mg | INHALATION_SOLUTION | Freq: Four times a day (QID) | RESPIRATORY_TRACT | Status: DC | PRN

## 2019-12-29 MED ORDER — HYDRALAZINE HCL 20 MG/ML IJ SOLN: 10.0000 mg | INTRAMUSCULAR | Status: DC | PRN

## 2019-12-29 MED ORDER — TIOTROPIUM BROMIDE MONOHYDRATE 2.5 MCG/ACT IN AERS: 2.0000 | INHALATION_SPRAY | Freq: Every day | RESPIRATORY_TRACT | Status: DC | PRN

## 2019-12-29 MED ORDER — LABETALOL HCL 5 MG/ML IV SOLN
INTRAVENOUS | Status: AC
  Filled 2019-12-29: qty 4

## 2019-12-29 MED ORDER — HEPARIN (PORCINE) IN NACL 1000-0.9 UT/500ML-% IV SOLN
INTRAVENOUS | Status: AC
  Filled 2019-12-29: qty 1000

## 2019-12-29 MED ORDER — NITROGLYCERIN 0.4 MG SL SUBL: 0.4000 mg | SUBLINGUAL_TABLET | SUBLINGUAL | Status: DC | PRN

## 2019-12-29 MED ORDER — AZATHIOPRINE 50 MG PO TABS: 75.0000 mg | ORAL_TABLET | Freq: Every day | ORAL | Status: DC

## 2019-12-29 MED ORDER — GLIPIZIDE 5 MG PO TABS: 5.0000 mg | ORAL_TABLET | Freq: Every day | ORAL | Status: DC

## 2019-12-29 MED ORDER — AMLODIPINE BESYLATE 10 MG PO TABS: 10.0000 mg | ORAL_TABLET | Freq: Every day | ORAL | Status: DC

## 2019-12-29 MED ORDER — HYDROCODONE-ACETAMINOPHEN 5-325 MG PO TABS
2.0000 | ORAL_TABLET | Freq: Four times a day (QID) | ORAL | Status: DC | PRN
  Administered 2019-12-29: 2 via ORAL

## 2019-12-29 MED FILL — CLOPIDOGREL 75 MG TABLET: 75 | 30 days supply | Qty: 30 | Fill #0

## 2019-12-29 SURGICAL SUPPLY — 39 items
BALLN MINITREK OTW 1.20X12 (BALLOONS) ×2
BALLN SAPPHIRE 1.0X8 (BALLOONS) ×2
BALLN SAPPHIRE 1.5X15 (BALLOONS) ×2
BALLN SAPPHIRE 2.0X12 (BALLOONS) ×2
BALLN SAPPHIRE 2.0X15 (BALLOONS) ×2
BALLN SAPPHIRE 2.5X20 (BALLOONS) ×2
BALLN SAPPHIRE ~~LOC~~ 3.75X15 (BALLOONS) ×1 IMPLANT
BALLOON MINITREK OTW 1.20X12 (BALLOONS) IMPLANT
BALLOON SAPPHIRE 1.0X8 (BALLOONS) IMPLANT
BALLOON SAPPHIRE 1.5X15 (BALLOONS) IMPLANT
BALLOON SAPPHIRE 2.0X12 (BALLOONS) IMPLANT
BALLOON SAPPHIRE 2.0X15 (BALLOONS) IMPLANT
BALLOON SAPPHIRE 2.5X20 (BALLOONS) IMPLANT
CABLE ADAPT PACING TEMP 12FT (ADAPTER) ×1 IMPLANT
CATH 5FR JL3.5 JR4 ANG PIG MP (CATHETERS) ×1 IMPLANT
CATH LAUNCHER 6FR JR4 (CATHETERS) ×1 IMPLANT
CATH OPTICROSS HD (CATHETERS) ×1 IMPLANT
CATH S G BIP PACING (CATHETERS) ×1 IMPLANT
CATH TELEPORT (CATHETERS) ×1 IMPLANT
CROWN DIAMONDBACK CLASSIC 1.25 (BURR) ×1 IMPLANT
DEVICE RAD COMP TR BAND LRG (VASCULAR PRODUCTS) ×1 IMPLANT
ELECT DEFIB PAD ADLT CADENCE (PAD) ×1 IMPLANT
GLIDESHEATH SLEND SS 6F .021 (SHEATH) ×1 IMPLANT
GUIDEWIRE INQWIRE 1.5J.035X260 (WIRE) IMPLANT
INQWIRE 1.5J .035X260CM (WIRE) ×2
KIT ENCORE 26 ADVANTAGE (KITS) ×1 IMPLANT
KIT HEART LEFT (KITS) ×2 IMPLANT
KIT HEMO VALVE WATCHDOG (MISCELLANEOUS) ×1 IMPLANT
LUBRICANT VIPERSLIDE CORONARY (MISCELLANEOUS) ×1 IMPLANT
PACK CARDIAC CATHETERIZATION (CUSTOM PROCEDURE TRAY) ×2 IMPLANT
SHEATH PINNACLE 6F 10CM (SHEATH) ×1 IMPLANT
SHEATH PROBE COVER 6X72 (BAG) ×2 IMPLANT
SLED PULL BACK IVUS (MISCELLANEOUS) ×1 IMPLANT
STENT RESOLUTE ONYX 3.0X26 (Permanent Stent) ×1 IMPLANT
TRANSDUCER W/STOPCOCK (MISCELLANEOUS) ×2 IMPLANT
TUBING CIL FLEX 10 FLL-RA (TUBING) ×2 IMPLANT
WIRE ASAHI PROWATER 180CM (WIRE) ×1 IMPLANT
WIRE GUIDE ASAHI EXTENSION 165 (WIRE) ×1 IMPLANT
WIRE VIPERWIRE COR FLEX .012 (WIRE) ×1 IMPLANT

## 2019-12-29 NOTE — Discharge Instructions (Signed)
Information about your medication: Plavix (anti-platelet agent)  Generic Name (Brand): clopidogrel (Plavix), once daily medication  PURPOSE: You are taking this medication along with aspirin to lower your chance of having a heart attack, stroke, or blood clots in your heart stent. These can be fatal. Brilinta and aspirin help prevent platelets from sticking together and forming a clot that can block an artery or your stent.   Common SIDE EFFECTS you may experience include: bruising or bleeding more easily, shortness of breath  Do not stop taking PLAVIX without talking to the doctor who prescribes it for you. People who are treated with a stent and stop taking Plavix too soon, have a higher risk of getting a blood clot in the stent, having a heart attack, or dying. If you stop Plavix because of bleeding, or for other reasons, your risk of a heart attack or stroke may increase.   Tell all of your doctors and dentists that you are taking Plavix. They should talk to the doctor who prescribed Brilinta for you before you have any surgery or invasive procedure.   Contact your health care provider if you experience: severe or uncontrollable bleeding, pink/red/brown urine, vomiting blood or vomit that looks like "coffee grounds", red or black stools (looks like tar), coughing up blood or blood clots ----------------------------------------------------------------------------------------------------------------------   DRINK PLENTY OF FLUIDS FOR THE NEXT 2-3 DAYS.  KEEP ARM ELEVATED THE REMAINDER OF THE DAY.   Radial Site Care  This sheet gives you information about how to care for yourself after your procedure. Your health care provider may also give you more specific instructions. If you have problems or questions, contact your health care provider. What can I expect after the procedure? After the procedure, it is common to have:  Bruising and tenderness at the catheter insertion area. Follow  these instructions at home: Medicines  Take over-the-counter and prescription medicines only as told by your health care provider. Insertion site care 1. Follow instructions from your health care provider about how to take care of your insertion site. Make sure you: ? Wash your hands with soap and water before you change your bandage (dressing). If soap and water are not available, use hand sanitizer. ? Change your dressing as told by your health care provider. 2. Check your insertion site every day for signs of infection. Check for: ? Redness, swelling, or pain. ? Fluid or blood. ? Pus or a bad smell. ? Warmth. 3. Do not take baths, swim, or use a hot tub for 5 days. 4. You may shower 24-48 hours after the procedure. ? Remove the dressing and gently wash the site with plain soap and water. ? Pat the area dry with a clean towel. ? Do not rub the site. That could cause bleeding. 5. Do not apply powder or lotion to the site. Activity  1. For 24 hours after the procedure, or as directed by your health care provider: ? Do not flex or bend the affected arm. ? Do not push or pull heavy objects with the affected arm. ? Do not drive yourself home from the hospital or clinic. You may drive 24 hours after the procedure. ? Do not operate machinery or power tools. 2. Do not push, pull or lift anything that is heavier than 10 lb for 5 days. 3. Ask your health care provider when it is okay to: ? Return to work or school. ? Resume usual physical activities or sports. ? Resume sexual activity. General instructions  If  the catheter site starts to bleed, raise your arm and put firm pressure on the site. If the bleeding does not stop, get help right away. This is a medical emergency.  If you went home on the same day as your procedure, a responsible adult should be with you for the first 24 hours after you arrive home.  Keep all follow-up visits as told by your health care provider. This is  important. Contact a health care provider if:  You have a fever.  You have redness, swelling, or yellow drainage around your insertion site. Get help right away if:  You have unusual pain at the radial site.  The catheter insertion area swells very fast.  The insertion area is bleeding, and the bleeding does not stop when you hold steady pressure on the area.  Your arm or hand becomes pale, cool, tingly, or numb. These symptoms may represent a serious problem that is an emergency. Do not wait to see if the symptoms will go away. Get medical help right away. Call your local emergency services (911 in the [place] Do not drive yourself to the hospital. Summary  After the procedure, it is common to have bruising and tenderness at the site.  Follow instructions from your health care provider about how to take care of your radial site wound. Check the wound every day for signs of infection.  Do not push, pull or lift anything that is heavier than 10 lb for 5 days.  This information is not intended to replace advice given to you by your health care provider. Make sure you discuss any questions you have with your health care provider. Document Revised: 04/07/2017 Document Reviewed: 04/07/2017 Elsevier Patient Education  2020 [employer].

## 2019-12-29 NOTE — Progress Notes (Addendum)
Discussed stent, Plavix, restrictions, diet, exercise, NTG and CRPII. Pt voiced understanding. She quit smoking many years ago. Will refer to [place] Yves Dill CES, ACSM 2:32 PM 12/29/2019

## 2019-12-29 NOTE — Progress Notes (Signed)
Site area: Right groin a 6 french venous sheath was removed  Site Prior to Removal:  Level 0  Pressure Applied For 15 MINUTES    Bedrest Beginning at 1220p  Manual:   Yes.    Patient Status During Pull:  stable  Post Pull Groin Site:  Level 0  Post Pull Instructions Given:  Yes.    Post Pull Pulses Present:  Yes.    Dressing Applied:  Yes.    Comments:

## 2019-12-29 NOTE — Discharge Summary (Addendum)
Discharge Summary for Same Day PCI   Patient ID: MICHOLE LECUYER MRN: [id number]; DOB: 05/11/1950  Admit date: 12/29/2019 Discharge date: 12/29/2019  Primary Care Provider: Lemmie Evens, MD  Primary Cardiologist: Carlyle Dolly, MD   Discharge Diagnoses    Active Problems:   Coronary artery disease   HTN  HLD  DM  Diagnostic Studies/Procedures    CORONARY STENT INTERVENTION  CORONARY ATHERECTOMY  Intravascular Ultrasound/IVUS  LEFT HEART CATH AND CORONARY ANGIOGRAPHY  TEMPORARY PACEMAKER  Conclusion    Previously placed 1st Mrg drug eluting stent is widely patent.  Ost RCA to Prox RCA lesion is 90% stenosed.  After orbital atherectomy drug-eluting stent was successfully placed using a STENT RESOLUTE ONYX 3.0X26, and postdilated to 3.75 mm. Stent was optimized with IVUS.  Post intervention, there is a 0% residual stenosis.  The left ventricular systolic function is normal.  LV end diastolic pressure is normal.  The left ventricular ejection fraction is 55-65% by visual estimate.  There is no aortic valve stenosis.  RCA was prone to vasospasm during the PCI.   Continue dual antiplatelet therapy for at least 6 months.  Consider clopidogrel monotherapy long-term given some diffuse disease in her LAD system.  Continue with attempts at aggressive secondary prevention.  Plan for same day discharge.   Diagnostic Dominance: Right  Intervention    History of Present Illness     ROZANN HOLTS is a 69 y.o. female with hx of CAD s/p , HTN, HLD, COPD, CVA, HTN and prior smoker presented for outpatient cath.   Admit 01/2016 with NSTEMI. S/p DES to OM. Unsuccesful PCI to RCA with wire induced dissection, managed medically. The OM was thought to be the culprit. If recurring symptoms can consider repeat RCA intervention.   Seen by Dr. Harl Bowie 12/20/2019 with complains of chest pain with nitro responsive with symptoms consistent with recurrent angina. Cardiac  cath arranged. Increased Amlodipine to 10mg  qd.    Hospital Course     The patient underwent cardiac cath as noted above with patent OM stent. 90% stenosed Ost to Pro RCA s/p orbital atherectomy and IVUS guided DES. LVEF 55-60% by visual estimate. continue dual antiplatelet therapy for at least 6 months.  Consider clopidogrel monotherapy long-term given some diffuse disease in her LAD system. The patient was seen by cardiac rehab while in short stay. There were no observed complications post cath. Radialcath site was re-evaluated prior to discharge and found to be stable without any complications. Instructions/precautions regarding cath site care were given prior to discharge.  [name] was seen by Dr. Irish Lack and determined stable for discharge home. Follow up with our office has been arranged. Medications are listed below. Pertinent changes include additional of Plavix.    Cath/PCI Registry Performance & Quality Measures: 1. Aspirin prescribed? - Yes 2. ADP Receptor Inhibitor (Plavix/Clopidogrel, Brilinta/Ticagrelor or Effient/Prasugrel) prescribed (includes medically managed patients)? - Yes 3. High Intensity Statin (Lipitor 40-80mg  or Crestor 20-40mg ) prescribed? - Yes 4. For EF <40%, was ACEI/ARB prescribed? - Yes 5. For EF <40%, Aldosterone Antagonist (Spironolactone or Eplerenone) prescribed? - Not Applicable (EF >/= 95%) 6. Cardiac Rehab Phase II ordered (Included Medically managed Patients)? - Yes  Discharge Vitals Blood pressure (!) 168/72, pulse 72, temperature 98.2 F (36.8 C), temperature source Oral, resp. rate 20, height 5\' 4"  (1.626 m), weight 89.4 kg, SpO2 95 %.  Filed Weights   12/29/19 0600  Weight: 89.4 kg    Last Labs & Radiologic Studies  CBC Recent Labs    12/27/19 0937  WBC 8.7  HGB 14.7  HCT 43.2  MCV 88.9  PLT 604*   Basic Metabolic Panel Recent Labs    12/27/19 0937 12/29/19 0620  NA 137 139  K 3.4* 3.4*  CL 100 102  CO2 30 26   GLUCOSE 116* 104*  BUN 13 10  CREATININE 0.67 0.64  CALCIUM 9.4 9.2  _____________  CARDIAC CATHETERIZATION  Result Date: 12/29/2019  Previously placed 1st Mrg drug eluting stent is widely patent.  Ost RCA to Prox RCA lesion is 90% stenosed.  After orbital atherectomy drug-eluting stent was successfully placed using a STENT RESOLUTE ONYX 3.0X26, and postdilated to 3.75 mm. Stent was optimized with IVUS.  Post intervention, there is a 0% residual stenosis.  The left ventricular systolic function is normal.  LV end diastolic pressure is normal.  The left ventricular ejection fraction is 55-65% by visual estimate.  There is no aortic valve stenosis.  RCA was prone to vasospasm during the PCI.  Continue dual antiplatelet therapy for at least 6 months.  Consider clopidogrel monotherapy long-term given some diffuse disease in her LAD system.  Continue with attempts at aggressive secondary prevention. Plan for same day discharge.   US BREAST LTD UNI LEFT INC AXILLA  Result Date: 12/12/2019 CLINICAL DATA:  69 year old female with focal pain in the INNER LEFT breast and for annual bilateral mammogram. History of prior OUTER LEFT breast surgery for CSL. EXAM: DIGITAL DIAGNOSTIC BILATERAL MAMMOGRAM WITH CAD AND TOMO ULTRASOUND LEFT BREAST COMPARISON:  Previous exam(s). ACR Breast Density Category b: There are scattered areas of fibroglandular density. FINDINGS: 2D/3D full field views of both breasts and spot compression view of the LEFT breast demonstrate no suspicious mass, nonsurgical distortion or worrisome calcifications. Surgical changes in the OUTER LEFT breast identified. Mammographic images were processed with CAD. Targeted ultrasound is performed, showing no sonographic abnormality at the site of patient's pain within the INNER LEFT breast. No solid or cystic mass, distortion or abnormal shadowing noted. IMPRESSION: 1. No mammographic or sonographic abnormality within the INNER LEFT breast, in  the area of patient's pain. 2. No mammographic evidence of breast malignancy. 3. LEFT breast surgical changes. RECOMMENDATION: Bilateral screening mammogram in 1 year. I have discussed the findings and recommendations with the patient. If applicable, a reminder letter will be sent to the patient regarding the next appointment. BI-RADS CATEGORY  2: Benign. Electronically Signed   By: Margarette Canada M.D.   On: 12/12/2019 11:17   MM DIAG BREAST TOMO BILATERAL  Result Date: 12/12/2019 CLINICAL DATA:  69 year old female with focal pain in the INNER LEFT breast and for annual bilateral mammogram. History of prior OUTER LEFT breast surgery for CSL. EXAM: DIGITAL DIAGNOSTIC BILATERAL MAMMOGRAM WITH CAD AND TOMO ULTRASOUND LEFT BREAST COMPARISON:  Previous exam(s). ACR Breast Density Category b: There are scattered areas of fibroglandular density. FINDINGS: 2D/3D full field views of both breasts and spot compression view of the LEFT breast demonstrate no suspicious mass, nonsurgical distortion or worrisome calcifications. Surgical changes in the OUTER LEFT breast identified. Mammographic images were processed with CAD. Targeted ultrasound is performed, showing no sonographic abnormality at the site of patient's pain within the INNER LEFT breast. No solid or cystic mass, distortion or abnormal shadowing noted. IMPRESSION: 1. No mammographic or sonographic abnormality within the INNER LEFT breast, in the area of patient's pain. 2. No mammographic evidence of breast malignancy. 3. LEFT breast surgical changes. RECOMMENDATION: Bilateral screening mammogram in 1  year. I have discussed the findings and recommendations with the patient. If applicable, a reminder letter will be sent to the patient regarding the next appointment. BI-RADS CATEGORY  2: Benign. Electronically Signed   By: Margarette Canada M.D.   On: 12/12/2019 11:17    Disposition   Pt is being discharged home today in good condition.  Follow-up Plans & Appointments      Follow-up Information    Erlene Quan, [place]. Go on 01/08/2020.   Specialties: Cardiology, Radiology Why: @1 :30pm for cath follow up Contact information: [address] [place] [address] [phone]              Discharge Instructions    AMB Referral to Cardiac Rehabilitation - Phase II   Complete by: As directed    Diagnosis: Coronary Stents   After initial evaluation and assessments completed: Virtual Based Care may be provided alone or in conjunction with Phase 2 Cardiac Rehab based on patient barriers.: Yes       Discharge Medications   Allergies as of 12/29/2019      Reactions   Gabapentin Nausea And Vomiting   Prednisone Palpitations   Tape Other (See Comments)   Causes blisters to form   Latex Rash, Other (See Comments)   Blisters      Medication List    TAKE these medications   albuterol (2.5 MG/3ML) 0.083% nebulizer solution Commonly known as: PROVENTIL Take 2.5 mg by nebulization every 6 (six) hours as needed for wheezing or shortness of breath.   albuterol 108 (90 Base) MCG/ACT inhaler Commonly known as: VENTOLIN HFA Inhale 2 puffs into the lungs every 6 (six) hours as needed for wheezing or shortness of breath.   amLODipine 10 MG tablet Commonly known as: NORVASC Take 1 tablet (10 mg total) by mouth daily.   aspirin EC 81 MG tablet Take 1 tablet (81 mg total) by mouth daily.   aspirin-acetaminophen-caffeine 250-250-65 MG tablet Commonly known as: EXCEDRIN MIGRAINE Take 1 tablet by mouth every 6 (six) hours as needed for headache.   atorvastatin 80 MG tablet Commonly known as: LIPITOR TAKE ONE TABLET BY MOUTH DAILY AT 6 P.M. What changed: See the new instructions.   azaTHIOprine 50 MG tablet Commonly known as: IMURAN TAKE 1 AND 1/2 TABLETS BY MOUTH ONCE DAILY.   carvedilol 3.125 MG tablet Commonly known as: COREG TAEK 1 TABLET BY MOUTH TWICE DAILY WITH MEALS. What changed: See the new instructions.   cetirizine 10 MG  tablet Commonly known as: ZYRTEC Take 10 mg by mouth daily.   clopidogrel 75 MG tablet Commonly known as: Plavix Take 1 tablet (75 mg total) by mouth daily.   clorazepate 7.5 MG tablet Commonly known as: TRANXENE Take 7.5 mg by mouth 3 (three) times daily as needed for anxiety.   famotidine 20 MG tablet Commonly known as: PEPCID Take 1 tablet (20 mg total) by mouth at bedtime.   furosemide 40 MG tablet Commonly known as: LASIX Take 40 mg by mouth daily as needed for fluid (leg/hand swelling.).   glipiZIDE 5 MG tablet Commonly known as: Glucotrol Take 1 tablet (5 mg total) by mouth daily before breakfast.   HYDROcodone-acetaminophen 10-325 MG tablet Commonly known as: NORCO Take 1 tablet by mouth 3 (three) times daily as needed for moderate pain.   isosorbide mononitrate 20 MG tablet Commonly known as: ISMO Take 20 mg by mouth 2 (two) times daily at 10 AM and 5 PM.   levothyroxine 50 MCG tablet  Commonly known as: SYNTHROID Take 50 mcg by mouth daily before breakfast.   losartan 50 MG tablet Commonly known as: COZAAR Take 1 tablet (50 mg total) by mouth daily.   nitroGLYCERIN 0.4 MG SL tablet Commonly known as: NITROSTAT Place 0.4 mg under the tongue every 5 (five) minutes as needed for chest pain.   ondansetron 4 MG tablet Commonly known as: ZOFRAN Take 1 tablet (4 mg total) by mouth 3 (three) times daily as needed.   pantoprazole 40 MG tablet Commonly known as: PROTONIX Take 1 tablet (40 mg total) by mouth daily.   senna 8.6 MG tablet Commonly known as: SENOKOT Take 2 tablets by mouth at bedtime.   Spiriva Respimat 2.5 MCG/ACT Aers Generic drug: Tiotropium Bromide Monohydrate Inhale 2 puffs into the lungs daily as needed (shortness of breath).          Allergies Allergies  Allergen Reactions  . Gabapentin Nausea And Vomiting  . Prednisone Palpitations  . Tape Other (See Comments)    Causes blisters to form  . Latex Rash and Other (See Comments)     Blisters    Outstanding Labs/Studies   None  Duration of Discharge Encounter   Greater than 30 minutes including physician time.  SignedCrista Luria [place], PA 12/29/2019, 1:42 PM   I have examined the patient and reviewed assessment and plan and discussed with patient.  Agree with above as stated.    S/p PCI to the RCA.  Continue DAPT.  Radial precautions given.  Venous sheath placed in the groin as well for temp pacer.  Continue aggressive secondary prevention.    Larae Grooms

## 2019-12-29 NOTE — Interval H&P Note (Signed)
Cath Lab Visit (complete for each Cath Lab visit)  Clinical Evaluation Leading to the Procedure:   ACS: No.  Non-ACS:    Anginal Classification: CCS III  Anti-ischemic medical therapy: Minimal Therapy (1 class of medications)  Non-Invasive Test Results: No non-invasive testing performed  Prior CABG: No previous CABG  Prior PCI of OM in the past.  Diseased RCA with wire dissection at that time.    History and Physical Interval Note:  12/29/2019 7:29 AM  [name]  has presented today for surgery, with the diagnosis of chest pain.  The various methods of treatment have been discussed with the patient and family. After consideration of risks, benefits and other options for treatment, the patient has consented to  Procedure(s): LEFT HEART CATH AND CORONARY ANGIOGRAPHY (N/A) as a surgical intervention.  The patient's history has been reviewed, patient examined, no change in status, stable for surgery.  I have reviewed the patient's chart and labs.  Questions were answered to the patient's satisfaction.     Larae Grooms

## 2020-01-01 ENCOUNTER — Encounter (HOSPITAL_COMMUNITY): Payer: Self-pay | Admitting: Interventional Cardiology

## 2020-01-04 ENCOUNTER — Other Ambulatory Visit (INDEPENDENT_AMBULATORY_CARE_PROVIDER_SITE_OTHER): Payer: Self-pay | Admitting: Gastroenterology

## 2020-01-04 DIAGNOSIS — K754 Autoimmune hepatitis: Secondary | ICD-10-CM

## 2020-01-08 ENCOUNTER — Ambulatory Visit (INDEPENDENT_AMBULATORY_CARE_PROVIDER_SITE_OTHER): Payer: Medicare Other | Admitting: Cardiology

## 2020-01-08 ENCOUNTER — Encounter: Payer: Self-pay | Admitting: Cardiology

## 2020-01-08 ENCOUNTER — Other Ambulatory Visit: Payer: Self-pay

## 2020-01-08 VITALS — BP 142/78 | HR 81 | Ht 64.0 in | Wt 210.8 lb

## 2020-01-08 DIAGNOSIS — I10 Essential (primary) hypertension: Secondary | ICD-10-CM

## 2020-01-08 DIAGNOSIS — J449 Chronic obstructive pulmonary disease, unspecified: Secondary | ICD-10-CM

## 2020-01-08 DIAGNOSIS — E785 Hyperlipidemia, unspecified: Secondary | ICD-10-CM

## 2020-01-08 DIAGNOSIS — I2510 Atherosclerotic heart disease of native coronary artery without angina pectoris: Secondary | ICD-10-CM

## 2020-01-08 DIAGNOSIS — R609 Edema, unspecified: Secondary | ICD-10-CM

## 2020-01-08 DIAGNOSIS — E1165 Type 2 diabetes mellitus with hyperglycemia: Secondary | ICD-10-CM | POA: Diagnosis not present

## 2020-01-08 DIAGNOSIS — Z8673 Personal history of transient ischemic attack (TIA), and cerebral infarction without residual deficits: Secondary | ICD-10-CM | POA: Diagnosis not present

## 2020-01-08 DIAGNOSIS — Z9861 Coronary angioplasty status: Secondary | ICD-10-CM

## 2020-01-08 NOTE — Progress Notes (Signed)
Cardiology Office Note:    Date:  01/08/2020   ID:  [name], DOB [birth year], MRN [id number]  PCP:  Lemmie Evens, MD  Cardiologist:  Carlyle Dolly, MD  Electrophysiologist:  None   Referring MD: Lemmie Evens, MD   No chief complaint on file. Post PCI  History of Present Illness:    [name] is a 69 y.o. female with a hx of CAD, status post OM PCI in 2017.  At that time RCA intervention was attempted but was unsuccessful secondary to dissection and she was treated medically.  She did well until 6 weeks ago when she noted increasing chest pain consistent with angina.  She was set up for outpatient catheterization.  On 12/29/2019 she underwent intervention to the RCA with good results.  She had high-speed rotational arthrectomy and a DES placed.  The prior OM stent was patent.  She had moderate LAD disease of 20 to 40%.  She is in the office today for follow-up from that hospitalization.  She no longer has chest pain. I reviewed her cath diagram with her in detail.  She is tolerating her medications.  Her only complaint is lower extremity edema which is been going on for some time.  I suspect this is from amlodipine.  Past Medical History:  Diagnosis Date  . Anxiety   . Arthritis    "hands, arms, back, neck, knees, fingers" (01/21/2016)  . Atrial fibrillation (HCC)    ASA daiy; came off Coumadin shortly after stroke  . Breast cancer, left breast ([place]) 1970s; 2015  . Cancer of skin of leg    BLE  . 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HCC)    left  . Chronic lower back pain   . Chronic pain   . COPD (chronic obstructive pulmonary disease) ([place])   . Coronary artery disease   . CVA (cerebral vascular accident) [facility]) pt denies right brain CVA   Right brain secondary to right internal carotid artery dissection for which 2 stents were placed;F/u cerebral angiography in 05/2008-minimal plaque; healing of previously identified left internal carotid artery dissection  .  Depression   . GERD (gastroesophageal reflux disease)   . Heart murmur   . Hepatic cirrhosis ([place]) 12/20/2017  . Hepatitis   . History of blood transfusion    "related to spleen"  . History of hiatal hernia   . History of kidney stones   . Hyperlipidemia   . Hypertension   . Hypothyroidism   . Kidney stone   . Melanoma of forearm, left ([place])   . Migraine    "a few migraines/year" (01/21/2016)  . NSTEMI (non-ST elevated myocardial infarction) ([place]) 01/21/2016   Archie Endo 01/21/2016  . Obesity   . Stroke [facility]) 04/2003   2 carotid artery stents in place; small left parietal and left hemispheric CVA.Phillis Knack 01/21/2016  . Tobacco use disorder    50 pack years; questionably discontinued in 2010  . Wears glasses     Past Surgical History:  Procedure Laterality Date  . ABDOMINAL HYSTERECTOMY  1978  . APPENDECTOMY    . BREAST BIOPSY Left 1970s; 2015  . BREAST LUMPECTOMY Left 1970s  . BREAST LUMPECTOMY WITH NEEDLE LOCALIZATION Left 07/07/2013   Procedure: BREAST LUMPECTOMY WITH NEEDLE LOCALIZATION;  Surgeon: Merrie Roof, MD;  Location: [place];  Service: General;  Laterality: Left;  . CARDIAC CATHETERIZATION N/A 01/22/2016   Procedure: Left Heart Cath and Coronary Angiography;  Surgeon: Peter M Martinique, MD;  Location: [place]  CV LAB;  Service: Cardiovascular;  Laterality: N/A;  . CARDIAC CATHETERIZATION N/A 01/22/2016   Procedure: Coronary Stent Intervention;  Surgeon: Peter M Martinique, MD;  Location: [place] CV [facility];  Service: Cardiovascular;  Laterality: N/A;  . CAROTID STENT Left 04/2003   small left parietal and left hemispheric CVA/notes 07/29/2010  . CHOLECYSTECTOMY OPEN    . COLONOSCOPY  2011  . COLONOSCOPY N/A 05/10/2019   Procedure: COLONOSCOPY;  Surgeon: Rogene Houston, MD;  [facility];  Service: Endoscopy;  Laterality: N/A;  1:45  . CORONARY ATHERECTOMY N/A 12/29/2019   Procedure: CORONARY ATHERECTOMY;  Surgeon: Jettie Booze, MD;   Location: [place] [facility];  Service: Cardiovascular;  Laterality: N/A;  Prox RCA  . CORONARY STENT INTERVENTION N/A 12/29/2019   Procedure: CORONARY STENT INTERVENTION;  Surgeon: Jettie Booze, MD;  Location: [place] CV LAB;  Service: Cardiovascular;  Laterality: N/A;  Prox RCA  . ESOPHAGOGASTRODUODENOSCOPY  08/06/2011   Procedure: ESOPHAGOGASTRODUODENOSCOPY (EGD);  Surgeon: Rogene Houston, MD;  [facility];  Service: Endoscopy;  Laterality: N/A;  . ESOPHAGOGASTRODUODENOSCOPY N/A 05/10/2019   Procedure: ESOPHAGOGASTRODUODENOSCOPY (EGD);  Surgeon: Rogene Houston, MD;  [facility];  Service: Endoscopy;  Laterality: N/A;  . FLEXIBLE SIGMOIDOSCOPY  08/06/2011   Procedure: FLEXIBLE SIGMOIDOSCOPY;  Surgeon: Rogene Houston, MD;  [facility];  Service: Endoscopy;  Laterality: N/A;  . INGUINAL HERNIA REPAIR Left 1970s  . INTRAVASCULAR ULTRASOUND/IVUS N/A 12/29/2019   Procedure: Intravascular Ultrasound/IVUS;  Surgeon: Jettie Booze, MD;  Location: [place] CV [facility];  Service: Cardiovascular;  Laterality: N/A;  . LARYNX SURGERY  1970s   Polyps excised  . LEFT HEART CATH AND CORONARY ANGIOGRAPHY N/A 12/29/2019   Procedure: LEFT HEART CATH AND CORONARY ANGIOGRAPHY;  Surgeon: Jettie Booze, MD;  Location: [place] [facility];  Service: Cardiovascular;  Laterality: N/A;  . MALONEY DILATION  05/10/2019   Procedure: Venia Minks DILATION;  Surgeon: Rogene Houston, MD;  [facility];  Service: Endoscopy;;  . MELANOMA EXCISION Left ~ 2016   forearm  . POLYPECTOMY  05/10/2019   Procedure: POLYPECTOMY;  Surgeon: Rogene Houston, MD;  [facility];  Service: Endoscopy;;  colon  . 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   spontaneous rupture  . TEMPORARY PACEMAKER N/A 12/29/2019   Procedure: TEMPORARY PACEMAKER;  Surgeon: Jettie Booze, MD;  Location: [place] [facility];  Service: Cardiovascular;  Laterality: N/A;    Current  Medications: Current Meds  Medication Sig  . albuterol (PROVENTIL HFA;VENTOLIN HFA) 108 (90 Base) MCG/ACT inhaler Inhale 2 puffs into the lungs every 6 (six) hours as needed for wheezing or shortness of breath.  Marland Kitchen albuterol (PROVENTIL) (2.5 MG/3ML) 0.083% nebulizer solution Take 2.5 mg by nebulization every 6 (six) hours as needed for wheezing or shortness of breath.  Marland Kitchen amLODipine (NORVASC) 10 MG tablet Take 1 tablet (10 mg total) by mouth daily.  Marland Kitchen aspirin EC 81 MG tablet Take 1 tablet (81 mg total) by mouth daily.  Marland Kitchen aspirin-acetaminophen-caffeine (EXCEDRIN MIGRAINE) 250-250-65 MG tablet Take 1 tablet by mouth every 6 (six) hours as needed for headache.  Marland Kitchen atorvastatin (LIPITOR) 80 MG tablet TAKE ONE TABLET BY MOUTH DAILY AT 6 P.M.  . azaTHIOprine (IMURAN) 50 MG tablet TAKE 1 AND 1/2 TABLETS BY MOUTH ONCE DAILY.  . carvedilol (COREG) 3.125 MG tablet TAEK 1 TABLET BY MOUTH TWICE DAILY WITH MEALS.  . cetirizine (ZYRTEC) 10 MG tablet Take 10  mg by mouth daily.  . clopidogrel (PLAVIX) 75 MG tablet Take 1 tablet (75 mg total) by mouth daily.  . clorazepate (TRANXENE) 7.5 MG tablet Take 7.5 mg by mouth 3 (three) times daily as needed for anxiety.   . famotidine (PEPCID) 20 MG tablet Take 1 tablet (20 mg total) by mouth at bedtime.  . furosemide (LASIX) 40 MG tablet Take 40 mg by mouth daily as needed for fluid (leg/hand swelling.).   Marland Kitchen glipiZIDE (GLUCOTROL) 5 MG tablet Take 1 tablet (5 mg total) by mouth daily before breakfast.  . HYDROcodone-acetaminophen (NORCO) 10-325 MG tablet Take 1 tablet by mouth 3 (three) times daily as needed for moderate pain.   . isosorbide mononitrate (ISMO,MONOKET) 20 MG tablet Take 20 mg by mouth 2 (two) times daily at 10 AM and 5 PM.  . levothyroxine (SYNTHROID, LEVOTHROID) 50 MCG tablet Take 50 mcg by mouth daily before breakfast.   . losartan (COZAAR) 50 MG tablet Take 1 tablet (50 mg total) by mouth daily.  . nitroGLYCERIN (NITROSTAT) 0.4 MG SL tablet Place 0.4 mg  under the tongue every 5 (five) minutes as needed for chest pain.    Marland Kitchen ondansetron (ZOFRAN) 4 MG tablet Take 1 tablet (4 mg total) by mouth 3 (three) times daily as needed.  . pantoprazole (PROTONIX) 40 MG tablet Take 1 tablet (40 mg total) by mouth daily.  Marland Kitchen senna (SENOKOT) 8.6 MG tablet Take 2 tablets by mouth at bedtime.   Marland Kitchen SPIRIVA RESPIMAT 2.5 MCG/ACT AERS Inhale 2 puffs into the lungs daily as needed (shortness of breath).    Current Facility-Administered Medications for the 01/08/20 encounter (Office Visit) with Erlene Quan, PA-C  Medication  . sodium chloride flush (NS) 0.9 % injection 3 mL     Allergies:   Gabapentin, Prednisone, Tape, and Latex   Social History   Socioeconomic History  . Marital status: Divorced    Spouse name: Not on file  . Number of children: 2  . Years of education: Not on file  . Highest education level: Not on file  Occupational History  . Occupation: Disabled  . Occupation: retired  Tobacco Use  . Smoking status: Former Smoker    Packs/day: 1.00    Years: 40.00    Pack years: 40.00    Types: Cigarettes    Quit date: 07/06/2007    Years since quitting: 12.5  . Smokeless tobacco: Never Used  Vaping Use  . Vaping Use: Never used  Substance and Sexual Activity  . Alcohol use: No    Alcohol/week: 0.0 standard drinks  . Drug use: No  . Sexual activity: Not Currently  Other Topics Concern  . Not on file  Social History Narrative   ** Merged History Encounter **       Social Determinants of Health   Financial Resource Strain:   . Difficulty of Paying Living Expenses: Not on file  Food Insecurity:   . Worried About Charity fundraiser in the Last Year: Not on file  . Ran Out of Food in the Last Year: Not on file  Transportation Needs:   . Lack of Transportation (Medical): Not on file  . Lack of Transportation (Non-Medical): Not on file  Physical Activity:   . Days of Exercise per Week: Not on file  . Minutes of Exercise per Session:  Not on file  Stress:   . Feeling of Stress : Not on file  Social Connections:   . Frequency of Communication with  Friends and Family: Not on file  . Frequency of Social Gatherings with Friends and Family: Not on file  . Attends Religious Services: Not on file  . Active Member of Clubs or Organizations: Not on file  . Attends Archivist Meetings: Not on file  . Marital Status: Not on file     Family History: The patient's family history includes Arthritis in an other family member; Cancer in her father, sister, sister, and another family member; Dementia in her sister; Diabetes in an other family member; Heart disease in an other family member; Heart failure in her brother, father, and mother; Kidney disease in an other family member.  ROS:   Please see the history of present illness.     All other systems reviewed and are negative.  EKGs/Labs/Other Studies Reviewed:    The following studies were reviewed today: Cath/ PCI 12/29/2019  EKG:  EKG is ordered today.  The ekg ordered today demonstrates NSR, HR 80  Recent Labs: 10/10/2019: ALT 20 12/27/2019: Hemoglobin 14.7; Platelets 425 12/29/2019: BUN 10; Creatinine, Ser 0.64; Potassium 3.4; Sodium 139  Recent Lipid Panel    Component Value Date/Time   CHOL 119 03/27/2016 1134   TRIG 99 03/27/2016 1134   HDL 35 (L) 03/27/2016 1134   CHOLHDL 3.4 03/27/2016 1134   VLDL 20 03/27/2016 1134   LDLCALC 64 03/27/2016 1134    Physical Exam:    VS:  BP (!) 142/78   Pulse 81   Ht 5\' 4"  (1.626 m)   Wt 210 lb 12.8 oz (95.6 kg)   SpO2 91%   BMI 36.18 kg/m     Wt Readings from Last 3 Encounters:  01/08/20 210 lb 12.8 oz (95.6 kg)  12/29/19 197 lb (89.4 kg)  12/28/19 197 lb (89.4 kg)     GEN: Overweight Caucasian female,  well developed in no acute distress HEENT: Normal NECK: No JVD; No carotid bruits CARDIAC: RRR, no murmurs, rubs, gallops RESPIRATORY:  Clear to auscultation without rales, wheezing or rhonchi   ABDOMEN: Soft, non-tender, non-distended MUSCULOSKELETAL:  1+ RLE edema, trace LLE edema, Rt radial site ecchymosis-no hematoma. ; No deformity  SKIN: Warm and dry NEUROLOGIC:  Alert and oriented x 3 PSYCHIATRIC:  Normal affect   ASSESSMENT:    CAD S/P percutaneous coronary angioplasty OM PCI with DES 2017- unsuccessful RCA PCI then (dissection). Recurrent angina Oct 2021- RCA HSRA with DES placement.  Prior OM stent patent. No chest pain since her recent PCI  Essential hypertension Repeat B/P by me 122/62  Dyslipidemia, goal LDL below 70 On high dose statin Rx- PCP follows  Type 2 diabetes mellitus with hyperglycemia, without long-term current use of insulin (HCC) Diet controlled  COPD (chronic obstructive pulmonary disease) (HCC) Stable  Edema Bilateral LE edema -Rt > Lt.  I suspect this is from Amlodipine 10mg .  I have asked her to decrease the dose to 5 mg and follow B/P at home.  Virtual f/u with me in 2-3 weeks.  If need we can increase Losartan to 100 mg.   PLAN:    Reduce Amlodipine to 5 mg.  Monitor home B/P.  Virtaul f/u with me 2-3 weeks.   Medication Adjustments/Labs and Tests Ordered: Current medicines are reviewed at length with the patient today.  Concerns regarding medicines are outlined above.  Orders Placed This Encounter  Procedures  . EKG 12-Lead   No orders of the defined types were placed in this encounter.   Patient Instructions  Medication Instructions:  Your physician recommends that you continue on your current medications as directed. Please refer to the Current Medication list given to you today.  *If you need a refill on your cardiac medications before your next appointment, please call your pharmacy*  Lab Work: None ordered today  If you have labs (blood work) drawn today and your tests are completely normal, you will receive your results only by: Marland Kitchen MyChart Message (if you have MyChart) OR . A paper copy in the mail If you have any  lab test that is abnormal or we need to change your treatment, we will call you to review the results.  Testing/Procedures: None ordered today  Follow-Up: On 01/25/20 at 11:45AM with Kerin Ransom, PA-C. This visit will be a telephone visit, you do not need to come to the office.   Other Instructions Check your blood pressure 3 times a week at different times of the day after you take your blood pressure medication. Give these readings to [facility] at your 2 week follow up.    Signed, Kerin Ransom, PA-C  01/08/2020 2:19 PM    [facility]

## 2020-01-08 NOTE — Assessment & Plan Note (Signed)
On high dose statin Rx- PCP follows

## 2020-01-08 NOTE — Assessment & Plan Note (Signed)
OM PCI with DES 2017- unsuccessful RCA PCI then (dissection). Recurrent angina Oct 2021- RCA HSRA with DES placement.  Prior OM stent patent. No chest pain since her recent PCI

## 2020-01-08 NOTE — Assessment & Plan Note (Signed)
Repeat B/P by me 122/62

## 2020-01-08 NOTE — Assessment & Plan Note (Signed)
Diet controlled.  

## 2020-01-08 NOTE — Assessment & Plan Note (Signed)
Bilateral LE edema -Rt > Lt.  I suspect this is from Amlodipine 10mg .  I have asked her to decrease the dose to 5 mg and follow B/P at home.  Virtual f/u with me in 2-3 weeks.  If need we can increase Losartan to 100 mg.

## 2020-01-08 NOTE — Assessment & Plan Note (Signed)
Stable

## 2020-01-08 NOTE — Patient Instructions (Signed)
Medication Instructions:  Your physician recommends that you continue on your current medications as directed. Please refer to the Current Medication list given to you today.  *If you need a refill on your cardiac medications before your next appointment, please call your pharmacy*  Lab Work: None ordered today  If you have labs (blood work) drawn today and your tests are completely normal, you will receive your results only by: Marland Kitchen MyChart Message (if you have MyChart) OR . A paper copy in the mail If you have any lab test that is abnormal or we need to change your treatment, we will call you to review the results.  Testing/Procedures: None ordered today  Follow-Up: On 01/25/20 at 11:45AM with Kerin Ransom, PA-C. This visit will be a telephone visit, you do not need to come to the office.   Other Instructions Check your blood pressure 3 times a week at different times of the day after you take your blood pressure medication. Give these readings to [facility] at your 2 week follow up.

## 2020-01-09 ENCOUNTER — Telehealth: Payer: Self-pay | Admitting: Cardiology

## 2020-01-09 MED ORDER — AMLODIPINE BESYLATE 5 MG PO TABS: 5.0000 mg | ORAL_TABLET | Freq: Every day | ORAL | 3 refills | Status: DC

## 2020-01-09 NOTE — Telephone Encounter (Signed)
New message    Patient called in she said that [facility] told her to lower the dosage of the amLODipine (NORVASC) 10 MG tablet to 5 mg , she would like this called into the [employer]

## 2020-01-09 NOTE — Telephone Encounter (Signed)
Order placed. Call to notify pt no answer. No voicemail.

## 2020-01-10 ENCOUNTER — Other Ambulatory Visit: Payer: Self-pay | Admitting: Nurse Practitioner

## 2020-01-10 DIAGNOSIS — N644 Mastodynia: Secondary | ICD-10-CM

## 2020-01-24 ENCOUNTER — Other Ambulatory Visit (INDEPENDENT_AMBULATORY_CARE_PROVIDER_SITE_OTHER): Payer: Self-pay | Admitting: Gastroenterology

## 2020-01-24 DIAGNOSIS — K754 Autoimmune hepatitis: Secondary | ICD-10-CM

## 2020-01-25 ENCOUNTER — Telehealth: Payer: Medicare Other | Admitting: Cardiology

## 2020-02-01 ENCOUNTER — Ambulatory Visit: Payer: Medicare Other | Admitting: Student

## 2020-02-01 NOTE — Progress Notes (Signed)
Virtual Visit via Telephone Note   This visit type was conducted due to national recommendations for restrictions regarding the COVID-19 Pandemic (e.g. social distancing) in an effort to limit this patient's exposure and mitigate transmission in our community.  Due to her co-morbid illnesses, this patient is at least at moderate risk for complications without adequate follow up.  This format is felt to be most appropriate for this patient at this time.  The patient did not have access to video technology/had technical difficulties with video requiring transitioning to audio format only (telephone).  All issues noted in this document were discussed and addressed.  No physical exam could be performed with this format.  Please refer to the patient's chart for her  consent to telehealth for [facility].  Evaluation Performed:  Follow-up visit  This visit type was conducted due to national recommendations for restrictions regarding the COVID-19 Pandemic (e.g. social distancing).  This format is felt to be most appropriate for this patient at this time.  All issues noted in this document were discussed and addressed.  No physical exam was performed (except for noted visual exam findings with Video Visits).  Please refer to the patient's chart (MyChart message for video visits and phone note for telephone visits) for the patient's consent to telehealth for [place]  Date:  02/05/2020   ID:  [name], DOB 09-10-1950, MRN [id number]  Patient Location:  [address] [place] [address]   Provider location:     [address] Office [phone] Fax [phone]   PCP:  Lemmie Evens, MD  Cardiologist:  Carlyle Dolly, MD  Electrophysiologist:  None   Chief Complaint: Lower extremity edema  History of Present Illness:    [name] is a 69 y.o. female who presents via audio/video  conferencing for a telehealth visit today.  Patient verified DOB and address.  She has a PMH of coronary artery disease, status post cardiac catheterization with OM PCI 2017.  RCA a intervention was unsuccessful at that time due to dissection since she was treated medically.  She underwent repeat cardiac catheterization 12/29/2019 and received high-speed rotational arthrectomy and DES to her RCA.  Her prior OM stent was patent.  She had moderate LAD disease 20-40%.  She was seen in the clinic on 01/08/2020 by Kerin Ransom, PA-C.  She was chest pain-free at that time.  She did however report lower extremity edema.  Her blood pressure on recheck was 122/62.  Her amlodipine, which was felt to be the culprit, was reduced to 5 mg daily.  She is seen virtually today in follow-up and states she feels well today.  Since reducing her pain her lower extremity edema is gone away.  She reports eating a low-sodium diet and being back to all of her daily activities including housework and activities of daily living.  She has been keeping a blood pressure log which showed her blood pressures ranging in the 130s over 80s.  She denies bleeding issues Plavix and reports compliance with her medications.  She states that she would like to have her follow-up in [place] or any in [place].  She does not come to [facility] very often and feels more comfortable with visits in her area.  I will give her the salty 6 diet she, have her increase her physical activity as tolerated, order a fasting lipid panel, and have her follow-up with Dr. Harl Bowie in 3  months.  Today she denies chest pain, shortness of breath, lower extremity edema, fatigue, palpitations, melena, hematuria, hemoptysis, diaphoresis, weakness, presyncope, syncope, orthopnea, and PND.   The patient does not symptoms concerning for COVID-19 infection (fever, chills, cough, or new SHORTNESS OF BREATH).    Prior CV studies:   The following studies were reviewed  today:   Previously placed 1st Mrg drug eluting stent is widely patent.  Ost RCA to Prox RCA lesion is 90% stenosed.  After orbital atherectomy drug-eluting stent was successfully placed using a STENT RESOLUTE ONYX 3.0X26, and postdilated to 3.75 mm. Stent was optimized with IVUS.  Post intervention, there is a 0% residual stenosis.  The left ventricular systolic function is normal.  LV end diastolic pressure is normal.  The left ventricular ejection fraction is 55-65% by visual estimate.  There is no aortic valve stenosis.  RCA was prone to vasospasm during the PCI.   Continue dual antiplatelet therapy for at least 6 months.  Consider clopidogrel monotherapy long-term given some diffuse disease in her LAD system.  Continue with attempts at aggressive secondary prevention.  Plan for same day discharge.  Diagnostic Dominance: Right  Intervention      Past Medical History:  Diagnosis Date  . Anxiety   . Arthritis    "hands, arms, back, neck, knees, fingers" (01/21/2016)  . Atrial fibrillation (HCC)    ASA daiy; came off Coumadin shortly after stroke  . Breast cancer, left breast ([place]) 1970s; 2015  . Cancer of skin of leg    BLE  . 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HCC)    left  . Chronic lower back pain   . Chronic pain   . COPD (chronic obstructive pulmonary disease) ([place])   . Coronary artery disease   . CVA (cerebral vascular accident) [facility]) pt denies right brain CVA   Right brain secondary to right internal carotid artery dissection for which 2 stents were placed;F/u cerebral angiography in 05/2008-minimal plaque; healing of previously identified left internal carotid artery dissection  . Depression   . GERD (gastroesophageal reflux disease)   . Heart murmur   . Hepatic cirrhosis ([place]) 12/20/2017  . Hepatitis   . History of blood transfusion    "related to spleen"  . History of hiatal hernia   . History of kidney stones   . Hyperlipidemia   . Hypertension   .  Hypothyroidism   . Kidney stone   . Melanoma of forearm, left ([place])   . Migraine    "a few migraines/year" (01/21/2016)  . NSTEMI (non-ST elevated myocardial infarction) ([place]) 01/21/2016   Archie Endo 01/21/2016  . Obesity   . Stroke [facility]) 04/2003   2 carotid artery stents in place; small left parietal and left hemispheric CVA.Phillis Knack 01/21/2016  . Tobacco use disorder    50 pack years; questionably discontinued in 2010  . Wears glasses    Past Surgical History:  Procedure Laterality Date  . ABDOMINAL HYSTERECTOMY  1978  . APPENDECTOMY    . BREAST BIOPSY Left 1970s; 2015  . BREAST LUMPECTOMY Left 1970s  . BREAST LUMPECTOMY WITH NEEDLE LOCALIZATION Left 07/07/2013   Procedure: BREAST LUMPECTOMY WITH NEEDLE LOCALIZATION;  Surgeon: Merrie Roof, MD;  Location: [place];  Service: General;  Laterality: Left;  . CARDIAC CATHETERIZATION N/A 01/22/2016   Procedure: Left Heart Cath and Coronary Angiography;  Surgeon: Peter M Martinique, MD;  Location: [place] CV LAB;  Service: Cardiovascular;  Laterality: N/A;  . CARDIAC CATHETERIZATION N/A 01/22/2016  Procedure: Coronary Stent Intervention;  Surgeon: Peter M Martinique, MD;  Location: [place] CV LAB;  Service: Cardiovascular;  Laterality: N/A;  . CAROTID STENT Left 04/2003   small left parietal and left hemispheric CVA/notes 07/29/2010  . CHOLECYSTECTOMY OPEN    . COLONOSCOPY  2011  . COLONOSCOPY N/A 05/10/2019   Procedure: COLONOSCOPY;  Surgeon: Rogene Houston, MD;  [facility];  Service: Endoscopy;  Laterality: N/A;  1:45  . CORONARY ATHERECTOMY N/A 12/29/2019   Procedure: CORONARY ATHERECTOMY;  Surgeon: Jettie Booze, MD;  Location: [place] [facility];  Service: Cardiovascular;  Laterality: N/A;  Prox RCA  . CORONARY STENT INTERVENTION N/A 12/29/2019   Procedure: CORONARY STENT INTERVENTION;  Surgeon: Jettie Booze, MD;  Location: [place] CV LAB;  Service: Cardiovascular;  Laterality: N/A;  Prox  RCA  . ESOPHAGOGASTRODUODENOSCOPY  08/06/2011   Procedure: ESOPHAGOGASTRODUODENOSCOPY (EGD);  Surgeon: Rogene Houston, MD;  [facility];  Service: Endoscopy;  Laterality: N/A;  . ESOPHAGOGASTRODUODENOSCOPY N/A 05/10/2019   Procedure: ESOPHAGOGASTRODUODENOSCOPY (EGD);  Surgeon: Rogene Houston, MD;  [facility];  Service: Endoscopy;  Laterality: N/A;  . FLEXIBLE SIGMOIDOSCOPY  08/06/2011   Procedure: FLEXIBLE SIGMOIDOSCOPY;  Surgeon: Rogene Houston, MD;  [facility];  Service: Endoscopy;  Laterality: N/A;  . INGUINAL HERNIA REPAIR Left 1970s  . INTRAVASCULAR ULTRASOUND/IVUS N/A 12/29/2019   Procedure: Intravascular Ultrasound/IVUS;  Surgeon: Jettie Booze, MD;  Location: [place] [facility] LAB;  Service: Cardiovascular;  Laterality: N/A;  . LARYNX SURGERY  1970s   Polyps excised  . LEFT HEART CATH AND CORONARY ANGIOGRAPHY N/A 12/29/2019   Procedure: LEFT HEART CATH AND CORONARY ANGIOGRAPHY;  Surgeon: Jettie Booze, MD;  Location: [place] [facility];  Service: Cardiovascular;  Laterality: N/A;  . MALONEY DILATION  05/10/2019   Procedure: Venia Minks DILATION;  Surgeon: Rogene Houston, MD;  [facility];  Service: Endoscopy;;  . MELANOMA EXCISION Left ~ 2016   forearm  . POLYPECTOMY  05/10/2019   Procedure: POLYPECTOMY;  Surgeon: Rogene Houston, MD;  [facility];  Service: Endoscopy;;  colon  . 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   spontaneous rupture  . TEMPORARY PACEMAKER N/A 12/29/2019   Procedure: TEMPORARY PACEMAKER;  Surgeon: Jettie Booze, MD;  Location: [place] [facility];  Service: Cardiovascular;  Laterality: N/A;     Current Meds  Medication Sig  . albuterol (PROVENTIL HFA;VENTOLIN HFA) 108 (90 Base) MCG/ACT inhaler Inhale 2 puffs into the lungs every 6 (six) hours as needed for wheezing or shortness of breath.  Marland Kitchen albuterol (PROVENTIL) (2.5 MG/3ML) 0.083% nebulizer solution Take 2.5 mg by nebulization every 6 (six)  hours as needed for wheezing or shortness of breath.  Marland Kitchen amLODipine (NORVASC) 5 MG tablet Take 1 tablet (5 mg total) by mouth daily.  Marland Kitchen aspirin EC 81 MG tablet Take 1 tablet (81 mg total) by mouth daily.  Marland Kitchen aspirin-acetaminophen-caffeine (EXCEDRIN MIGRAINE) 250-250-65 MG tablet Take 1 tablet by mouth every 6 (six) hours as needed for headache.  Marland Kitchen atorvastatin (LIPITOR) 80 MG tablet TAKE ONE TABLET BY MOUTH DAILY AT 6 P.M.  . azaTHIOprine (IMURAN) 50 MG tablet TAKE 1 AND 1/2 TABLETS BY MOUTH ONCE DAILY.  . carvedilol (COREG) 3.125 MG tablet TAEK 1 TABLET BY MOUTH TWICE DAILY WITH MEALS.  . cetirizine (ZYRTEC) 10 MG tablet Take 10 mg by mouth daily.  . clopidogrel (PLAVIX) 75 MG tablet Take 1 tablet (75 mg total)  by mouth daily.  . clorazepate (TRANXENE) 7.5 MG tablet Take 7.5 mg by mouth 3 (three) times daily as needed for anxiety.   . famotidine (PEPCID) 20 MG tablet Take 1 tablet (20 mg total) by mouth at bedtime.  Marland Kitchen glipiZIDE (GLUCOTROL) 5 MG tablet Take 1 tablet (5 mg total) by mouth daily before breakfast.  . HYDROcodone-acetaminophen (NORCO) 10-325 MG tablet Take 1 tablet by mouth 3 (three) times daily as needed for moderate pain.   . isosorbide mononitrate (ISMO,MONOKET) 20 MG tablet Take 20 mg by mouth 2 (two) times daily at 10 AM and 5 PM.  . levothyroxine (SYNTHROID, LEVOTHROID) 50 MCG tablet Take 50 mcg by mouth daily before breakfast.   . losartan (COZAAR) 50 MG tablet Take 1 tablet (50 mg total) by mouth daily.  . nitroGLYCERIN (NITROSTAT) 0.4 MG SL tablet Place 0.4 mg under the tongue every 5 (five) minutes as needed for chest pain.    Marland Kitchen ondansetron (ZOFRAN) 4 MG tablet Take 1 tablet (4 mg total) by mouth 3 (three) times daily as needed.  . pantoprazole (PROTONIX) 40 MG tablet Take 1 tablet (40 mg total) by mouth daily.  Marland Kitchen senna (SENOKOT) 8.6 MG tablet Take 2 tablets by mouth at bedtime.   Marland Kitchen SPIRIVA RESPIMAT 2.5 MCG/ACT AERS Inhale 2 puffs into the lungs daily as needed (shortness of  breath).    Current Facility-Administered Medications for the 02/05/20 encounter (Telemedicine) with Deberah Pelton, NP  Medication  . sodium chloride flush (NS) 0.9 % injection 3 mL     Allergies:   Gabapentin, Prednisone, Tape, and Latex   Social History   Tobacco Use  . Smoking status: Former Smoker    Packs/day: 1.00    Years: 40.00    Pack years: 40.00    Types: Cigarettes    Quit date: 07/06/2007    Years since quitting: 12.5  . Smokeless tobacco: Never Used  Vaping Use  . Vaping Use: Never used  Substance Use Topics  . Alcohol use: No    Alcohol/week: 0.0 standard drinks  . Drug use: No     Family Hx: The patient's family history includes Arthritis in an other family member; Cancer in her father, sister, sister, and another family member; Dementia in her sister; Diabetes in an other family member; Heart disease in an other family member; Heart failure in her brother, father, and mother; Kidney disease in an other family member.  ROS:   Please see the history of present illness.     All other systems reviewed and are negative.   Labs/Other Tests and Data Reviewed:    Recent Labs: 10/10/2019: ALT 20 12/27/2019: Hemoglobin 14.7; Platelets 425 12/29/2019: BUN 10; Creatinine, Ser 0.64; Potassium 3.4; Sodium 139   Recent Lipid Panel Lab Results  Component Value Date/Time   CHOL 119 03/27/2016 11:34 AM   TRIG 99 03/27/2016 11:34 AM   HDL 35 (L) 03/27/2016 11:34 AM   CHOLHDL 3.4 03/27/2016 11:34 AM   LDLCALC 64 03/27/2016 11:34 AM    Wt Readings from Last 3 Encounters:  01/08/20 210 lb 12.8 oz (95.6 kg)  12/29/19 197 lb (89.4 kg)  12/28/19 197 lb (89.4 kg)     Exam:    Vital Signs:  BP 140/80    Well nourished, well developed female in no  acute distress.   ASSESSMENT & PLAN:    1.  Bilateral lower extremity edema-denies swelling today.  She reports significant improvement.  Previously noted that her right lower  extremity was greater than the left  with +1 pitting edema on the right and trace lower extremity edema on the left. Continue amlodipine 5 mg daily Lower extremity support stockings Elevate lower extremities when not active Increase ambulation Heart healthy low-sodium diet-salty six given  Coronary artery disease-no chest pain today.  Cardiac catheterization with PCI and DES 2017 (unsuccessful RCA PCI due to dissection).  Had recurrent angina October 2021 underwent cardiac catheterization and had high-speed rotational arthrectomy to her RCA with DES placement. Continue amlodipine, aspirin, atorvastatin, carvedilol, Imdur, losartan Heart healthy low-sodium diet-salty 6 given Increase physical activity as tolerated  Hyperlipidemia-LDL goal less than 70.  LDL 64 1/18. Continue atorvastatin Heart healthy low-sodium high-fiber diet Repeat lipid panel  Essential hypertension-BP today 140/80.  Continues to be well controlled at home.  130s over 80s reported. Continue amlodipine, carvedilol, Imdur, losartan Heart healthy low-sodium diet-salty 6 given Increase physical activity as tolerated Continue blood pressure log  Disposition: Follow-up with Dr. Harl Bowie in 3 months.  COVID-19 Education: The signs and symptoms of COVID-19 were discussed with the patient and how to seek care for testing (follow up with PCP or arrange E-visit).  The importance of social distancing was discussed today.  Patient Risk:   After full review of this patients clinical status, I feel that they are at least moderate risk at this time.  Time:   Today, I have spent 15 minutes with the patient with telehealth technology discussing medication, lower extremity edema, exercise, diet, reviewing cardiac catheterization results.  I spent greater than 20 minutes prior to her visit reviewing her previous cardiac tests, cardiac notes, and medications.   Medication Adjustments/Labs and Tests Ordered: Current medicines are reviewed at length with the patient  today.  Concerns regarding medicines are outlined above.   Tests Ordered: No orders of the defined types were placed in this encounter.  Medication Changes: No orders of the defined types were placed in this encounter.   Disposition:  in 3 month(s)  Signed, Jossie Ng. Takila Kronberg NP-C    10/18/2018 11:58 AM    [address] Office [phone] Fax [phone]

## 2020-02-05 ENCOUNTER — Telehealth (INDEPENDENT_AMBULATORY_CARE_PROVIDER_SITE_OTHER): Payer: Medicare Other | Admitting: General Practice

## 2020-02-05 ENCOUNTER — Encounter: Payer: Self-pay | Admitting: General Practice

## 2020-02-05 VITALS — BP 140/80

## 2020-02-05 DIAGNOSIS — I10 Essential (primary) hypertension: Secondary | ICD-10-CM | POA: Diagnosis not present

## 2020-02-05 DIAGNOSIS — I2510 Atherosclerotic heart disease of native coronary artery without angina pectoris: Secondary | ICD-10-CM

## 2020-02-05 DIAGNOSIS — Z79899 Other long term (current) drug therapy: Secondary | ICD-10-CM

## 2020-02-05 DIAGNOSIS — R600 Localized edema: Secondary | ICD-10-CM | POA: Diagnosis not present

## 2020-02-05 DIAGNOSIS — E782 Mixed hyperlipidemia: Secondary | ICD-10-CM

## 2020-02-05 DIAGNOSIS — Z9861 Coronary angioplasty status: Secondary | ICD-10-CM

## 2020-02-05 NOTE — Addendum Note (Signed)
Addended by: Waylan Rocher on: 02/05/2020 11:43 AM   Modules accepted: Orders

## 2020-02-05 NOTE — Patient Instructions (Addendum)
Medication Instructions:  The current medical regimen is effective;  continue present plan and medications as directed. Please refer to the Current Medication list given to you today.  *If you need a refill on your cardiac medications before your next appointment, please call your pharmacy*  Lab Work: FASTING LIPID PANEL THIS WEEK If you have labs (blood work) drawn today and your tests are completely normal, you will receive your results only by:  [facility] (if you have MyChart) OR A paper copy in the mail.  If you have any lab test that is abnormal or we need to change your treatment, we will call you to review the results. You may go to any [facility] that is convenient for you however, we do have a lab in our office that is able to assist you. You DO NOT need an appointment for our lab. The lab is open 8:00am and closes at 4:00pm. Lunch 12:45 - 1:45pm.  Special Instructions PLEASE READ AND FOLLOW SALTY 6-ATTACHED-1,800 mg daily  PLEASE INCREASE PHYSICAL ACTIVITY AS TOLERATED  Follow-Up: Your next appointment:  3 month(s) In Person with You may see Carlyle Dolly, MD   January 27-2022 @ 1PM  At [facility], you and your health needs are our priority.  As part of our continuing mission to provide you with exceptional heart care, we have created designated Provider Care Teams.  These Care Teams include your primary Cardiologist (physician) and Advanced Practice Providers (APPs -  Physician Assistants and Nurse Practitioners) who all work together to provide you with the care you need, when you need it.  We recommend signing up for the patient portal called "MyChart".  Sign up information is provided on this After Visit Summary.  MyChart is used to connect with patients for Virtual Visits (Telemedicine).  Patients are able to view lab/test results, encounter notes, upcoming appointments, etc.  Non-urgent messages can be sent to your provider as well.   To learn more about what you can do  with MyChart, go to [email or web address].

## 2020-02-06 ENCOUNTER — Other Ambulatory Visit: Payer: Medicare Other

## 2020-02-06 ENCOUNTER — Ambulatory Visit: Payer: Medicare Other | Admitting: Physician Assistant

## 2020-02-13 ENCOUNTER — Ambulatory Visit: Payer: Medicare Other | Admitting: Physician Assistant

## 2020-03-13 ENCOUNTER — Other Ambulatory Visit: Payer: Medicare Other

## 2020-03-18 ENCOUNTER — Other Ambulatory Visit (INDEPENDENT_AMBULATORY_CARE_PROVIDER_SITE_OTHER): Payer: Self-pay

## 2020-03-18 DIAGNOSIS — K754 Autoimmune hepatitis: Secondary | ICD-10-CM

## 2020-03-18 MED ORDER — AZATHIOPRINE 50 MG PO TABS
75.0000 mg | ORAL_TABLET | Freq: Every day | ORAL | 2 refills | Status: DC
Start: 2020-03-18 — End: 2020-06-06

## 2020-03-18 NOTE — Telephone Encounter (Signed)
Will refill for three months, needs follow up appointment in January or February (was set up with Liborio Nixon, will need to follow with me or Dr. Karilyn Cota). Thanks

## 2020-03-18 NOTE — Telephone Encounter (Signed)
Hello Mitzie would you mind changing this patients appt as it is scheduled with Liborio Nixon. Per Dr. Levon Hedger. Thanks.

## 2020-04-09 LAB — LIPID PANEL
Chol/HDL Ratio: 3.4 ratio (ref 0.0–4.4)
Cholesterol, Total: 128 mg/dL (ref 100–199)
HDL: 38 mg/dL — ABNORMAL LOW (ref >39)
LDL Chol Calc (NIH): 69 mg/dL (ref 0–99)
Triglycerides: 115 mg/dL (ref 0–149)
VLDL Cholesterol Cal: 21 mg/dL (ref 5–40)

## 2020-04-10 ENCOUNTER — Ambulatory Visit (INDEPENDENT_AMBULATORY_CARE_PROVIDER_SITE_OTHER): Payer: Medicare Other | Admitting: Gastroenterology

## 2020-04-11 ENCOUNTER — Encounter: Payer: Self-pay | Admitting: Cardiology

## 2020-04-11 ENCOUNTER — Other Ambulatory Visit: Payer: Self-pay

## 2020-04-11 ENCOUNTER — Encounter (INDEPENDENT_AMBULATORY_CARE_PROVIDER_SITE_OTHER): Payer: Self-pay | Admitting: Internal Medicine

## 2020-04-11 ENCOUNTER — Ambulatory Visit (INDEPENDENT_AMBULATORY_CARE_PROVIDER_SITE_OTHER): Payer: Medicare Other | Admitting: Internal Medicine

## 2020-04-11 ENCOUNTER — Ambulatory Visit (INDEPENDENT_AMBULATORY_CARE_PROVIDER_SITE_OTHER): Payer: Medicare Other | Admitting: Cardiology

## 2020-04-11 VITALS — Ht 64.0 in | Wt 209.0 lb

## 2020-04-11 VITALS — BP 128/68 | HR 84 | Ht 64.0 in | Wt 211.0 lb

## 2020-04-11 DIAGNOSIS — I10 Essential (primary) hypertension: Secondary | ICD-10-CM | POA: Diagnosis not present

## 2020-04-11 DIAGNOSIS — I5032 Chronic diastolic (congestive) heart failure: Secondary | ICD-10-CM

## 2020-04-11 DIAGNOSIS — R1319 Other dysphagia: Secondary | ICD-10-CM | POA: Diagnosis not present

## 2020-04-11 DIAGNOSIS — E782 Mixed hyperlipidemia: Secondary | ICD-10-CM

## 2020-04-11 DIAGNOSIS — I2510 Atherosclerotic heart disease of native coronary artery without angina pectoris: Secondary | ICD-10-CM

## 2020-04-11 DIAGNOSIS — K219 Gastro-esophageal reflux disease without esophagitis: Secondary | ICD-10-CM | POA: Diagnosis not present

## 2020-04-11 DIAGNOSIS — K754 Autoimmune hepatitis: Secondary | ICD-10-CM | POA: Insufficient documentation

## 2020-04-11 DIAGNOSIS — R1312 Dysphagia, oropharyngeal phase: Secondary | ICD-10-CM | POA: Diagnosis not present

## 2020-04-11 NOTE — Patient Instructions (Signed)
Medication Instructions:  Your physician recommends that you continue on your current medications as directed. Please refer to the Current Medication list given to you today.  *If you need a refill on your cardiac medications before your next appointment, please call your pharmacy*   Lab Work: None today If you have labs (blood work) drawn today and your tests are completely normal, you will receive your results only by: . MyChart Message (if you have MyChart) OR . A paper copy in the mail If you have any lab test that is abnormal or we need to change your treatment, we will call you to review the results.   Testing/Procedures: None today   Follow-Up: At [facility], you and your health needs are our priority.  As part of our continuing mission to provide you with exceptional heart care, we have created designated Provider Care Teams.  These Care Teams include your primary Cardiologist (physician) and Advanced Practice Providers (APPs -  Physician Assistants and Nurse Practitioners) who all work together to provide you with the care you need, when you need it.  We recommend signing up for the patient portal called "MyChart".  Sign up information is provided on this After Visit Summary.  MyChart is used to connect with patients for Virtual Visits (Telemedicine).  Patients are able to view lab/test results, encounter notes, upcoming appointments, etc.  Non-urgent messages can be sent to your provider as well.   To learn more about what you can do with MyChart, go to [email or web address].    Your next appointment:   6 month(s)  The format for your next appointment:   In Person  Provider:   Jonathan Branch, MD   Other Instructions None       Thank you for choosing [facility] !         

## 2020-04-11 NOTE — Progress Notes (Signed)
Clinical Summary [name] is a 70 y.o.female seen today for follow up of the following medical problems.   1. CAD - admit 01/2016 with NSTEMI. Full cath report below. Received DES to OM. Unsuccesful PCI to RCA with wire induced dissection, managed medically. The OM was thought to be the culprit. If recurring symptoms can consider repeat RCA intervention. .   12/2019 cath DES to RCA. RCA was prone to vasospasm during procedure - no chest pain, no SOB or DOE - compliant with meds  2. COPD - 04/2018 admission with COPD exacerbation - still some lingering symptoms.    3. Chronic diastolic HF - chroinc stable LE edema that is mild, overall controlled  4. HTN - had some bilateral LE edema 12/2019, norvasc was lowered to 5mg  daily.   4. Bradycardia - admit 08/2016 with bradycardia - resolved on lower dose of coreg  5.History ofCVA - 2005 at [facility] - she reports history of stentsat the time   6. Hyperlipedemia  Jan 2022 TC 128 TG 115 HDL 38 LDL 69 - compliant with statin  7. 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 Anxiety   . Arthritis    "hands, arms, back, neck, knees, fingers" (01/21/2016)  . Atrial fibrillation (HCC)    ASA daiy; came off Coumadin shortly after stroke  . Breast cancer, left breast (HCC) 1970s; 2015  . Cancer of skin of leg    BLE  . 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HCC)    left  . Chronic lower back pain   . Chronic pain   . COPD (chronic obstructive pulmonary disease) (HCC)   . Coronary artery disease   . CVA (cerebral vascular accident) [facility](HCC) pt denies right brain CVA   Right brain secondary to right internal carotid artery dissection for which 2 stents were placed;F/u cerebral angiography in 05/2008-minimal plaque; healing of previously identified left internal carotid artery dissection  . Depression   . GERD  (gastroesophageal reflux disease)   . Heart murmur   . Hepatic cirrhosis (HCC) 12/20/2017  . Hepatitis   . History of blood transfusion    "related to spleen"  . History of hiatal hernia   . History of kidney stones   . Hyperlipidemia   . Hypertension   . Hypothyroidism   . Kidney stone   . Melanoma of forearm, left (HCC)   . Migraine    "a few migraines/year" (01/21/2016)  . NSTEMI (non-ST elevated myocardial infarction) (HCC) 01/21/2016   Hattie Perch/notes 01/21/2016  . Obesity   . Stroke [facility](HCC) 04/2003   2 carotid artery stents in place; small left parietal and left hemispheric CVA.[name]/nots 01/21/2016  . Tobacco use disorder    50 pack years; questionably discontinued in 2010  . Wears glasses      Allergies  Allergen Reactions  . Gabapentin Nausea And Vomiting  . Prednisone Palpitations  . Tape Other (See Comments)    Causes blisters to form  . Latex Rash and Other (See Comments)    Blisters     Current Outpatient Medications  Medication Sig Dispense Refill  . albuterol (PROVENTIL HFA;VENTOLIN HFA) 108 (90 Base) MCG/ACT inhaler Inhale 2 puffs into the lungs every 6 (six) hours as needed for wheezing or shortness of breath.    Marland Kitchen. albuterol (PROVENTIL) (2.5 MG/3ML) 0.083% nebulizer solution Take 2.5 mg by nebulization every 6 (six) hours  as needed for wheezing or shortness of breath.    Marland Kitchen amLODipine (NORVASC) 5 MG tablet Take 1 tablet (5 mg total) by mouth daily. 90 tablet 3  . aspirin EC 81 MG tablet Take 1 tablet (81 mg total) by mouth daily.    Marland Kitchen aspirin-acetaminophen-caffeine (EXCEDRIN MIGRAINE) 250-250-65 MG tablet Take 1 tablet by mouth every 6 (six) hours as needed for headache.    Marland Kitchen atorvastatin (LIPITOR) 80 MG tablet TAKE ONE TABLET BY MOUTH DAILY AT 6 P.M. 90 tablet 1  . azaTHIOprine (IMURAN) 50 MG tablet Take 1.5 tablets (75 mg total) by mouth daily. 45 tablet 2  . carvedilol (COREG) 3.125 MG tablet TAEK 1 TABLET BY MOUTH TWICE DAILY WITH MEALS. 180 tablet 3  . cetirizine  (ZYRTEC) 10 MG tablet Take 10 mg by mouth daily.    . clopidogrel (PLAVIX) 75 MG tablet Take 1 tablet (75 mg total) by mouth daily. 30 tablet 11  . clorazepate (TRANXENE) 7.5 MG tablet Take 7.5 mg by mouth 3 (three) times daily as needed for anxiety.     . famotidine (PEPCID) 20 MG tablet Take 1 tablet (20 mg total) by mouth at bedtime. 30 tablet 11  . furosemide (LASIX) 40 MG tablet Take 40 mg by mouth daily as needed for fluid (leg/hand swelling.).  (Patient not taking: Reported on 02/05/2020)    . glipiZIDE (GLUCOTROL) 5 MG tablet Take 1 tablet (5 mg total) by mouth daily before breakfast. 30 tablet 3  . HYDROcodone-acetaminophen (NORCO) 10-325 MG tablet Take 1 tablet by mouth 3 (three) times daily as needed for moderate pain.     . isosorbide mononitrate (ISMO,MONOKET) 20 MG tablet Take 20 mg by mouth 2 (two) times daily at 10 AM and 5 PM.    . levothyroxine (SYNTHROID, LEVOTHROID) 50 MCG tablet Take 50 mcg by mouth daily before breakfast.     . losartan (COZAAR) 50 MG tablet Take 1 tablet (50 mg total) by mouth daily. 90 tablet 1  . nitroGLYCERIN (NITROSTAT) 0.4 MG SL tablet Place 0.4 mg under the tongue every 5 (five) minutes as needed for chest pain.      Marland Kitchen ondansetron (ZOFRAN) 4 MG tablet Take 1 tablet (4 mg total) by mouth 3 (three) times daily as needed. 30 tablet 1  . pantoprazole (PROTONIX) 40 MG tablet Take 1 tablet (40 mg total) by mouth daily. 30 tablet 11  . senna (SENOKOT) 8.6 MG tablet Take 2 tablets by mouth at bedtime.     Marland Kitchen SPIRIVA RESPIMAT 2.5 MCG/ACT AERS Inhale 2 puffs into the lungs daily as needed (shortness of breath).      Current Facility-Administered Medications  Medication Dose Route Frequency Provider Last Rate Last Admin  . sodium chloride flush (NS) 0.9 % injection 3 mL  3 mL Intravenous Q12H Olinda Nola, Alphonse Guild, MD         Past Surgical History:  Procedure Laterality Date  . ABDOMINAL HYSTERECTOMY  1978  . APPENDECTOMY    . BREAST BIOPSY Left 1970s; 2015   . BREAST LUMPECTOMY Left 1970s  . BREAST LUMPECTOMY WITH NEEDLE LOCALIZATION Left 07/07/2013   Procedure: BREAST LUMPECTOMY WITH NEEDLE LOCALIZATION;  Surgeon: Merrie Roof, MD;  Location: [place];  Service: General;  Laterality: Left;  . CARDIAC CATHETERIZATION N/A 01/22/2016   Procedure: Left Heart Cath and Coronary Angiography;  Surgeon: Peter M Martinique, MD;  Location: [place] [facility];  Service: Cardiovascular;  Laterality: N/A;  . CARDIAC CATHETERIZATION N/A 01/22/2016  Procedure: Coronary Stent Intervention;  Surgeon: Peter M Martinique, MD;  Location: [place] [facility];  Service: Cardiovascular;  Laterality: N/A;  . CAROTID STENT Left 04/2003   small left parietal and left hemispheric CVA/notes 07/29/2010  . CHOLECYSTECTOMY OPEN    . COLONOSCOPY  2011  . COLONOSCOPY N/A 05/10/2019   Procedure: COLONOSCOPY;  Surgeon: Rogene Houston, MD;  [facility];  Service: Endoscopy;  Laterality: N/A;  1:45  . CORONARY ATHERECTOMY N/A 12/29/2019   Procedure: CORONARY ATHERECTOMY;  Surgeon: Jettie Booze, MD;  Location: [place] [facility];  Service: Cardiovascular;  Laterality: N/A;  Prox RCA  . CORONARY STENT INTERVENTION N/A 12/29/2019   Procedure: CORONARY STENT INTERVENTION;  Surgeon: Jettie Booze, MD;  Location: [place] [facility];  Service: Cardiovascular;  Laterality: N/A;  Prox RCA  . ESOPHAGOGASTRODUODENOSCOPY  08/06/2011   Procedure: ESOPHAGOGASTRODUODENOSCOPY (EGD);  Surgeon: Rogene Houston, MD;  [facility];  Service: Endoscopy;  Laterality: N/A;  . ESOPHAGOGASTRODUODENOSCOPY N/A 05/10/2019   Procedure: ESOPHAGOGASTRODUODENOSCOPY (EGD);  Surgeon: Rogene Houston, MD;  [facility];  Service: Endoscopy;  Laterality: N/A;  . FLEXIBLE SIGMOIDOSCOPY  08/06/2011   Procedure: FLEXIBLE SIGMOIDOSCOPY;  Surgeon: Rogene Houston, MD;  [facility];  Service: Endoscopy;  Laterality: N/A;  . INGUINAL HERNIA REPAIR Left  1970s  . INTRAVASCULAR ULTRASOUND/IVUS N/A 12/29/2019   Procedure: Intravascular Ultrasound/IVUS;  Surgeon: Jettie Booze, MD;  Location: [place] [facility];  Service: Cardiovascular;  Laterality: N/A;  . LARYNX SURGERY  1970s   Polyps excised  . LEFT HEART CATH AND CORONARY ANGIOGRAPHY N/A 12/29/2019   Procedure: LEFT HEART CATH AND CORONARY ANGIOGRAPHY;  Surgeon: Jettie Booze, MD;  Location: [place] [facility];  Service: Cardiovascular;  Laterality: N/A;  . MALONEY DILATION  05/10/2019   Procedure: Venia Minks DILATION;  Surgeon: Rogene Houston, MD;  [facility];  Service: Endoscopy;;  . MELANOMA EXCISION Left ~ 2016   forearm  . POLYPECTOMY  05/10/2019   Procedure: POLYPECTOMY;  Surgeon: Rogene Houston, MD;  [facility];  Service: Endoscopy;;  colon  . 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   spontaneous rupture  . TEMPORARY PACEMAKER N/A 12/29/2019   Procedure: TEMPORARY PACEMAKER;  Surgeon: Jettie Booze, MD;  Location: [place] [facility];  Service: Cardiovascular;  Laterality: N/A;     Allergies  Allergen Reactions  . Gabapentin Nausea And Vomiting  . Prednisone Palpitations  . Tape Other (See Comments)    Causes blisters to form  . Latex Rash and Other (See Comments)    Blisters      Family History  Problem Relation Age of Onset  . Heart failure Mother   . Cancer Father   . Heart failure Father   . Cancer Sister   . Dementia Sister   . Heart disease Other   . Arthritis Other   . Cancer Other   . Diabetes Other   . Kidney disease Other   . Cancer Sister   . Heart failure Brother      Social History [name] reports that she quit smoking about 12 years ago. Her smoking use included cigarettes. She has a 40.00 pack-year smoking history. She has never used smokeless tobacco. [name] reports no history of alcohol use.   Review of Systems CONSTITUTIONAL: No weight loss, fever, chills, weakness or fatigue.  HEENT: Eyes: No  visual loss, blurred vision, double vision or yellow sclerae.No  hearing loss, sneezing, congestion, runny nose or sore throat.  SKIN: No rash or itching.  CARDIOVASCULAR: per hpi RESPIRATORY: No shortness of breath, cough or sputum.  GASTROINTESTINAL: No anorexia, nausea, vomiting or diarrhea. No abdominal pain or blood.  GENITOURINARY: No burning on urination, no polyuria NEUROLOGICAL: No headache, dizziness, syncope, paralysis, ataxia, numbness or tingling in the extremities. No change in bowel or bladder control.  MUSCULOSKELETAL: No muscle, back pain, joint pain or stiffness.  LYMPHATICS: No enlarged nodes. No history of splenectomy.  PSYCHIATRIC: No history of depression or anxiety.  ENDOCRINOLOGIC: No reports of sweating, cold or heat intolerance. No polyuria or polydipsia.  Marland Kitchen   Physical Examination Today's Vitals   04/11/20 1255  BP: 128/68  Pulse: 84  SpO2: 96%  Weight: 211 lb (95.7 kg)  Height: 5\' 4"  (1.626 m)   Body mass index is 36.22 kg/m.  Gen: resting comfortably, no acute distress HEENT: no scleral icterus, pupils equal round and reactive, no palptable cervical adenopathy,  CV: RRR, no m/r/g, no jvd Resp: Clear to auscultation bilaterally GI: abdomen is soft, non-tender, non-distended, normal bowel sounds, no hepatosplenomegaly MSK: extremities are warm, no edema.  Skin: warm, no rash Neuro:  no focal deficits Psych: appropriate affect   Diagnostic Studies 01/2016 cath  The left ventricular systolic function is normal.  LV end diastolic pressure is normal.  The left ventricular ejection fraction is 55-65% by visual estimate.  Prox RCA lesion, 90 %stenosed.  1st Diag lesion, 60 %stenosed.  Prox LAD to Mid LAD lesion, 20 %stenosed.  2nd Diag lesion, 40 %stenosed.  Ost Ramus to Ramus lesion, 50 %stenosed.  1st Mrg lesion, 99 %stenosed.  A STENT SYNERGY DES 2.75X20 drug eluting stent was successfully placed.  Post intervention, there is a 0%  residual stenosis.  Ost RCA to Prox RCA lesion, 90 %stenosed.  Post intervention, there is a 90% residual stenosis with dissection from the proximal to mid RCA.  1. Severe 2 vessel obstructive CAD 2. Normal LV function and LV EDP 3. Successful stenting of the first OM with DES. This appears to be the culprit vessel. 4. Unsuccessful PCI of the RCA due to wire induced dissection of the vessel.   Plan: DAPT for one year. Aggressive BP control. Will continue IV Ntg overnight. Despite RCA dissection patient is pain free, hemodynamically stable and has a normal Ecg. If her symptoms remain stable I would treat medically and allow the RCA to heal. If she continues to have angina attempt at PCI of the RCA could be considered once the vessel has had time to heal - 6-8 weeks.    01/2016 echo Study Conclusions  - Left ventricle: The cavity size was normal. Systolic function was vigorous. The estimated ejection fraction was in the range of 65% to 70%. Wall motion was normal; there were no regional wall motion abnormalities. There was an increased relative contribution of atrial contraction to ventricular filling. Doppler parameters are consistent with abnormal left ventricular relaxation (grade 1 diastolic dysfunction). Doppler parameters are consistent with high ventricular filling pressure. - Tricuspid valve: There was trivial regurgitation. - Pulmonary arteries: PA peak pressure: 32 mm Hg (S). - Pericardium, extracardiac: A trivial, free-flowing pericardial effusion was identified along the right ventricular free wall. The fluid had no internal echoes.   12/2019 cath  Previously placed 1st Mrg drug eluting stent is widely patent.  Ost RCA to Prox RCA lesion is 90% stenosed.  After orbital atherectomy drug-eluting stent was successfully placed using a STENT  RESOLUTE ONYX 3.0X26, and postdilated to 3.75 mm. Stent was optimized with IVUS.  Post intervention, there  is a 0% residual stenosis.  The left ventricular systolic function is normal.  LV end diastolic pressure is normal.  The left ventricular ejection fraction is 55-65% by visual estimate.  There is no aortic valve stenosis.  RCA was prone to vasospasm during the PCI.   Continue dual antiplatelet therapy for at least 6 months.  Consider clopidogrel monotherapy long-term given some diffuse disease in her LAD system.  Continue with attempts at aggressive secondary prevention.  Plan for same day discharge.   Assessment and Plan   1. CAD - recent PCI 12/2019 as reported above - no recent symptoms, continue current meds. After DAPT interventinal had recommended plavix monotherapy given diffuse disease  2.HTN - at goal, continue current med - LE edema on norvasc 10, tolerating 5mg   4. Chronic diastolic HF -doing well, continue current meds   5. HTN -at goal, continue current meds  6. Hyperlipidemia - LDL at goal, continue statin    Arnoldo Lenis, M.D

## 2020-04-11 NOTE — Progress Notes (Signed)
Virtual Visit via Telephone Note  I connected with [name] on 04/11/20 at  4:48 PM EST by telephone and verified that I am speaking with the correct person using two identifiers.  Location: Patient: home Provider: office   I discussed the limitations, risks, security and privacy concerns of performing an evaluation and management service by telephone and the availability of in person appointments. I also discussed with the patient that there may be a patient responsible charge related to this service. The patient expressed understanding and agreed to proceed.   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  She had liver biopsy in November 2019 which revealed changes of autoimmune hepatitis and also raised the possibility of cholangitis/overlap syndrome.  Mitochondrial antibody was elevated back in October 2019 but normal 1 year ago.  She has been maintained on azathioprine.  She also has chronic GERD as well as history of dysphagia for which she underwent esophageal dilation in February 2021.  Patient continues to complain of abnormal feeling on the right side of her throat.  She has difficulty when she drinks water.  She strangles.  She also states solid food does not go down like it should.  However she has not had any episode of food impaction since her esophageal dilation almost a year ago.  She denies abdominal pain nausea or vomiting.  She takes pill for migraine no more than once every 2 months.  She has chronic low back pain on osteoarthrosis but does not take hydrocodone every day.  She states her bowels move as long as she takes senna.  Her appetite is good and her weight has been stable. She did not have planned blood work in October.     Current Outpatient Medications:  .  albuterol (PROVENTIL HFA;VENTOLIN HFA) 108 (90 Base) MCG/ACT inhaler, Inhale 2 puffs into the lungs every 6 (six) hours as needed for wheezing or  shortness of breath., Disp: , Rfl:  .  albuterol (PROVENTIL) (2.5 MG/3ML) 0.083% nebulizer solution, Take 2.5 mg by nebulization every 6 (six) hours as needed for wheezing or shortness of breath., Disp: , Rfl:  .  amLODipine (NORVASC) 5 MG tablet, Take 1 tablet (5 mg total) by mouth daily., Disp: 90 tablet, Rfl: 3 .  aspirin EC 81 MG tablet, Take 1 tablet (81 mg total) by mouth daily., Disp:  , Rfl:  .  aspirin-acetaminophen-caffeine (EXCEDRIN MIGRAINE) 250-250-65 MG tablet, Take 1 tablet by mouth every 6 (six) hours as needed for headache., Disp: , Rfl:  .  atorvastatin (LIPITOR) 80 MG tablet, TAKE ONE TABLET BY MOUTH DAILY AT 6 P.M., Disp: 90 tablet, Rfl: 1 .  azaTHIOprine (IMURAN) 50 MG tablet, Take 1.5 tablets (75 mg total) by mouth daily., Disp: 45 tablet, Rfl: 2 .  carvedilol (COREG) 3.125 MG tablet, TAEK 1 TABLET BY MOUTH TWICE DAILY WITH MEALS., Disp: 180 tablet, Rfl: 3 .  cetirizine (ZYRTEC) 10 MG tablet, Take 10 mg by mouth daily., Disp: , Rfl:  .  clopidogrel (PLAVIX) 75 MG tablet, Take 1 tablet (75 mg total) by mouth daily., Disp: 30 tablet, Rfl: 11 .  clorazepate (TRANXENE) 7.5 MG tablet, Take 7.5 mg by mouth 3 (three) times daily as needed for anxiety. , Disp: , Rfl:  .  famotidine (PEPCID) 20 MG tablet, Take 1 tablet (20 mg total) by mouth at bedtime., Disp: 30 tablet, Rfl: 11 .  furosemide (LASIX) 40 MG tablet, Take 40 mg by mouth daily  as needed for fluid (leg/hand swelling.)., Disp: , Rfl:  .  glipiZIDE (GLUCOTROL) 5 MG tablet, Take 1 tablet (5 mg total) by mouth daily before breakfast., Disp: 30 tablet, Rfl: 3 .  HYDROcodone-acetaminophen (NORCO) 10-325 MG tablet, Take 1 tablet by mouth 3 (three) times daily as needed for moderate pain. , Disp: , Rfl:  .  isosorbide mononitrate (ISMO,MONOKET) 20 MG tablet, Take 20 mg by mouth 2 (two) times daily at 10 AM and 5 PM., Disp: , Rfl:  .  levothyroxine (SYNTHROID, LEVOTHROID) 50 MCG tablet, Take 50 mcg by mouth daily before breakfast. ,  Disp: , Rfl:  .  losartan (COZAAR) 50 MG tablet, Take 1 tablet (50 mg total) by mouth daily., Disp: 90 tablet, Rfl: 1 .  nitroGLYCERIN (NITROSTAT) 0.4 MG SL tablet, Place 0.4 mg under the tongue every 5 (five) minutes as needed for chest pain.  , Disp: , Rfl:  .  ondansetron (ZOFRAN) 4 MG tablet, Take 1 tablet (4 mg total) by mouth 3 (three) times daily as needed., Disp: 30 tablet, Rfl: 1 .  pantoprazole (PROTONIX) 40 MG tablet, Take 1 tablet (40 mg total) by mouth daily., Disp: 30 tablet, Rfl: 11 .  senna (SENOKOT) 8.6 MG tablet, Take 2 tablets by mouth at bedtime. , Disp: , Rfl:  .  SPIRIVA RESPIMAT 2.5 MCG/ACT AERS, Inhale 2 puffs into the lungs daily as needed (shortness of breath). , Disp: , Rfl:   Current Facility-Administered Medications:  .  sodium chloride flush (NS) 0.9 % injection 3 mL, 3 mL, Intravenous, Q12H, Branch, Alphonse Guild, MD  Observations/Objective:  Patient reported her weight to be 209 pounds.  She was weighed at heart clinic.  CBC from 12/27/2019 WBC 8.7, H&H 14.7 and 43.2 and platelet count 425K.  LFTs from 10/10/2019  Bilirubin 0.4, AP 94, AST 25 and ALT 20. Serum albumin 4.0 and globulin 2.5.   Assessment and Plan:  #1.  Autoimmune hepatitis.  She had liver biopsy in November 2019.  Her transaminases have normalized with azathioprine.  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  Liver biopsy also suggested overlap syndrome.  Mitochondrial antibody initially was positive but subsequently negative.  She has not been treated with [place].  Last ultrasound was 1 year ago.  She is due for ultrasound.  #2. Dysphagia.  She has both oropharyngeal and esophageal dysphagia.  She possibly has motility disorder.  Oropharyngeal function needs to be assessed before esophageal manometry considered.  #3.  GERD.  Heartburn is well controlled with therapy.    Patient will go to the lab for following studies CBC with differential, serum IgG, LFTs and AFP. Abdominal  ultrasound. Speech pathology consultation regarding oropharyngeal dysphagia. Will arrange for esophageal manometry at a later date. Office visit in 6 months.   Follow Up Instructions:    I discussed the assessment and treatment plan with the patient. The patient was provided an opportunity to ask questions and all were answered. The patient agreed with the plan and demonstrated an understanding of the instructions.   The patient was advised to call back or seek an in-person evaluation if the symptoms worsen or if the condition fails to improve as anticipated.  I provided 13 minutes of non-face-to-face time during this encounter.   Hildred Laser, MD

## 2020-04-16 ENCOUNTER — Other Ambulatory Visit (INDEPENDENT_AMBULATORY_CARE_PROVIDER_SITE_OTHER): Payer: Self-pay | Admitting: *Deleted

## 2020-04-16 ENCOUNTER — Other Ambulatory Visit (INDEPENDENT_AMBULATORY_CARE_PROVIDER_SITE_OTHER): Payer: Self-pay

## 2020-04-16 DIAGNOSIS — R1312 Dysphagia, oropharyngeal phase: Secondary | ICD-10-CM

## 2020-04-16 DIAGNOSIS — K754 Autoimmune hepatitis: Secondary | ICD-10-CM

## 2020-04-16 DIAGNOSIS — K219 Gastro-esophageal reflux disease without esophagitis: Secondary | ICD-10-CM

## 2020-04-17 ENCOUNTER — Other Ambulatory Visit: Payer: Self-pay | Admitting: Nurse Practitioner

## 2020-04-17 ENCOUNTER — Ambulatory Visit
Admission: RE | Admit: 2020-04-17 | Discharge: 2020-04-17 | Disposition: A | Discharge status: Home / Self Care | Payer: Medicare Other | Source: Ambulatory Visit | Attending: Nurse Practitioner | Admitting: Nurse Practitioner

## 2020-04-17 ENCOUNTER — Other Ambulatory Visit: Payer: Self-pay

## 2020-04-17 DIAGNOSIS — N644 Mastodynia: Secondary | ICD-10-CM

## 2020-04-17 LAB — HEPATIC FUNCTION PANEL
AG Ratio: 1.4 (calc) (ref 1.0–2.5)
ALT: 17 U/L (ref 6–29)
AST: 24 U/L (ref 10–35)
Albumin: 4.3 g/dL (ref 3.6–5.1)
Alkaline phosphatase (APISO): 98 U/L (ref 37–153)
Bilirubin, Direct: 0.1 mg/dL (ref 0.0–0.2)
Globulin: 3 g/dL (calc) (ref 1.9–3.7)
Indirect Bilirubin: 0.4 mg/dL (calc) (ref 0.2–1.2)
Total Bilirubin: 0.5 mg/dL (ref 0.2–1.2)
Total Protein: 7.3 g/dL (ref 6.1–8.1)

## 2020-04-17 LAB — CBC WITH DIFFERENTIAL/PLATELET
Absolute Monocytes: 1192 cells/uL — ABNORMAL HIGH (ref 200–950)
Basophils Absolute: 101 cells/uL (ref 0–200)
Basophils Relative: 1 %
Eosinophils Absolute: 202 cells/uL (ref 15–500)
Eosinophils Relative: 2 %
HCT: 43.6 % (ref 35.0–45.0)
Hemoglobin: 14.8 g/dL (ref 11.7–15.5)
Lymphs Abs: 3121 cells/uL (ref 850–3900)
MCH: 29.5 pg (ref 27.0–33.0)
MCHC: 33.9 g/dL (ref 32.0–36.0)
MCV: 86.9 fL (ref 80.0–100.0)
MPV: 9.8 fL (ref 7.5–12.5)
Monocytes Relative: 11.8 %
Neutro Abs: 5484 cells/uL (ref 1500–7800)
Neutrophils Relative %: 54.3 %
Platelets: 482 10*3/uL — ABNORMAL HIGH (ref 140–400)
RBC: 5.02 10*6/uL (ref 3.80–5.10)
RDW: 13.2 % (ref 11.0–15.0)
Total Lymphocyte: 30.9 %
WBC: 10.1 10*3/uL (ref 3.8–10.8)

## 2020-04-17 LAB — AFP TUMOR MARKER: AFP-Tumor Marker: 4.1 ng/mL

## 2020-04-17 LAB — IGG: IgG (Immunoglobin G), Serum: 1159 mg/dL (ref 600–1540)

## 2020-04-25 ENCOUNTER — Other Ambulatory Visit (HOSPITAL_COMMUNITY): Payer: Self-pay | Admitting: Specialist

## 2020-04-25 ENCOUNTER — Other Ambulatory Visit: Payer: Self-pay

## 2020-04-25 ENCOUNTER — Ambulatory Visit (HOSPITAL_COMMUNITY)
Admission: RE | Admit: 2020-04-25 | Discharge: 2020-04-25 | Disposition: A | Discharge status: Home / Self Care | Payer: Medicare Other | Source: Ambulatory Visit | Attending: Internal Medicine | Admitting: Internal Medicine

## 2020-04-25 DIAGNOSIS — R1312 Dysphagia, oropharyngeal phase: Secondary | ICD-10-CM

## 2020-04-25 DIAGNOSIS — K754 Autoimmune hepatitis: Secondary | ICD-10-CM | POA: Insufficient documentation

## 2020-04-25 DIAGNOSIS — K219 Gastro-esophageal reflux disease without esophagitis: Secondary | ICD-10-CM | POA: Insufficient documentation

## 2020-04-25 DIAGNOSIS — R1319 Other dysphagia: Secondary | ICD-10-CM

## 2020-04-30 ENCOUNTER — Telehealth (INDEPENDENT_AMBULATORY_CARE_PROVIDER_SITE_OTHER): Payer: Self-pay | Admitting: Internal Medicine

## 2020-04-30 NOTE — Telephone Encounter (Signed)
Patient left voice mail message regarding [name] test results - please advise - [phone]

## 2020-05-01 NOTE — Telephone Encounter (Signed)
Patient states Dr. Laural Golden called her yesterday on 05/01/2020 and discussed the results with her.

## 2020-05-02 ENCOUNTER — Telehealth (INDEPENDENT_AMBULATORY_CARE_PROVIDER_SITE_OTHER): Payer: Self-pay | Admitting: Internal Medicine

## 2020-05-02 NOTE — Telephone Encounter (Signed)
Patient left voice mail message stated she wants to cancel her swallowing test on 2/22  - please advise - ph# [phone]

## 2020-05-02 NOTE — Telephone Encounter (Signed)
patients son is going on dialysis and has to have iron infusions and she needs to care for him right now so BPE has been canceled for now

## 2020-05-06 ENCOUNTER — Other Ambulatory Visit (HOSPITAL_COMMUNITY): Payer: Medicare Other

## 2020-05-06 ENCOUNTER — Ambulatory Visit (HOSPITAL_COMMUNITY): Payer: Medicare Other | Admitting: Speech Pathology

## 2020-05-07 NOTE — Telephone Encounter (Signed)
Noted and agree with

## 2020-05-13 ENCOUNTER — Other Ambulatory Visit: Payer: Self-pay | Admitting: Nurse Practitioner

## 2020-05-13 ENCOUNTER — Ambulatory Visit
Admission: RE | Admit: 2020-05-13 | Discharge: 2020-05-13 | Disposition: A | Discharge status: Home / Self Care | Payer: Medicare Other | Source: Ambulatory Visit | Attending: Nurse Practitioner | Admitting: Nurse Practitioner

## 2020-05-13 ENCOUNTER — Other Ambulatory Visit: Payer: Self-pay

## 2020-05-13 DIAGNOSIS — N644 Mastodynia: Secondary | ICD-10-CM

## 2020-06-06 ENCOUNTER — Other Ambulatory Visit (INDEPENDENT_AMBULATORY_CARE_PROVIDER_SITE_OTHER): Payer: Self-pay | Admitting: Gastroenterology

## 2020-06-06 DIAGNOSIS — K754 Autoimmune hepatitis: Secondary | ICD-10-CM

## 2020-06-20 ENCOUNTER — Other Ambulatory Visit: Payer: Self-pay | Admitting: Cardiology

## 2020-07-08 ENCOUNTER — Other Ambulatory Visit: Payer: Self-pay

## 2020-07-08 ENCOUNTER — Ambulatory Visit: Payer: Medicare Other | Admitting: Cardiology

## 2020-07-08 ENCOUNTER — Emergency Department (HOSPITAL_COMMUNITY): Payer: Medicare Other

## 2020-07-08 ENCOUNTER — Encounter (HOSPITAL_COMMUNITY): Payer: Self-pay | Admitting: *Deleted

## 2020-07-08 ENCOUNTER — Telehealth: Payer: Self-pay | Admitting: Cardiology

## 2020-07-08 ENCOUNTER — Emergency Department (HOSPITAL_COMMUNITY)
Admission: EM | Admit: 2020-07-08 | Discharge: 2020-07-08 | Discharge status: Against Medical Advice | Payer: Medicare Other | Attending: Emergency Medicine | Admitting: Emergency Medicine

## 2020-07-08 DIAGNOSIS — Z5321 Procedure and treatment not carried out due to patient leaving prior to being seen by health care provider: Secondary | ICD-10-CM | POA: Insufficient documentation

## 2020-07-08 DIAGNOSIS — M79602 Pain in left arm: Secondary | ICD-10-CM | POA: Insufficient documentation

## 2020-07-08 LAB — BASIC METABOLIC PANEL
Anion gap: 8 (ref 5–15)
BUN: 9 mg/dL (ref 8–23)
CO2: 27 mmol/L (ref 22–32)
Calcium: 8.9 mg/dL (ref 8.9–10.3)
Chloride: 101 mmol/L (ref 98–111)
Creatinine, Ser: 0.58 mg/dL (ref 0.44–1.00)
GFR, Estimated: >60 mL/min (ref >60)
Glucose, Bld: 67 mg/dL — ABNORMAL LOW (ref 70–99)
Potassium: 3.5 mmol/L (ref 3.5–5.1)
Sodium: 136 mmol/L (ref 135–145)

## 2020-07-08 LAB — CBC
HCT: 45.4 % (ref 36.0–46.0)
Hemoglobin: 14.9 g/dL (ref 12.0–15.0)
MCH: 30.3 pg (ref 26.0–34.0)
MCHC: 32.8 g/dL (ref 30.0–36.0)
MCV: 92.5 fL (ref 80.0–100.0)
Platelets: 400 10*3/uL (ref 150–400)
RBC: 4.91 MIL/uL (ref 3.87–5.11)
RDW: 14.2 % (ref 11.5–15.5)
WBC: 8.5 10*3/uL (ref 4.0–10.5)
nRBC: 0 % (ref 0.0–0.2)

## 2020-07-08 LAB — TROPONIN I (HIGH SENSITIVITY): Troponin I (High Sensitivity): 3 ng/L (ref <18)

## 2020-07-08 MED ORDER — ASPIRIN 81 MG PO CHEW: 324.0000 mg | CHEWABLE_TABLET | Freq: Once | ORAL | Status: DC

## 2020-07-08 NOTE — ED Triage Notes (Signed)
C/o pain in left arm,history of MI and stroke and states it started this way previously.

## 2020-07-08 NOTE — Telephone Encounter (Signed)
New message     Pt c/o of Chest Pain: STAT if CP now or developed within 24 hours  1. Are you having CP right now? yes  2. Are you experiencing any other symptoms (ex. SOB, nausea, vomiting, sweating)? Pain in arm   3. How long have you been experiencing CP? Has been going on for days  4. Is your CP continuous or coming and going? Comes and goes   5. Have you taken Nitroglycerin? Yes , per caregiver she has been taking nitro several times for days because of chest pain ?

## 2020-07-08 NOTE — ED Notes (Signed)
Pt states she is leaving, refuses to stay and get results from labs and xray done.  States she will follow up with Dr. Harl Bowie or come back if gets worse.

## 2020-07-08 NOTE — Telephone Encounter (Signed)
Patient is experiencing chest pains, weakness, fatigue, sweating, and a shooting pain in her arm. Patient denies SOB (I could hear her trying to catch her breath on the phone) and nausea. Patient admitted that she has had to take several nitroglycerin the past couple of days due to chest pain. She stated that it helped, but now CP were back. I informed patient that an evaluation at the ED was most likely the best course of action for her. Patient verbalized understanding. I will route message to Dr. Harl Bowie as Juluis Rainier.

## 2020-07-08 NOTE — ED Triage Notes (Signed)
Patient refused to be placed in room. Prefers to wait in waiting area

## 2020-07-08 NOTE — ED Triage Notes (Signed)
Emergency Medicine Provider Triage Evaluation Note  [name] , a 70 y.o. female  was evaluated in triage.  Pt complains of left arm pain for the last 3 days.  Patient endorses pain in the left lateral elbow, described as a soreness, 9/10, nonradiating.  She was concerned because she states she previously had a heart attack and all she experienced at that time was similar pain in the left elbow.  She also states her pain improved a couple days ago with nitroglycerin.     Review of Systems  Positive: Left arm pain Negative: Numbness, weakness, fever, chest pain, shortness of breath  Physical Exam  BP (!) 159/85   Pulse 78   Temp 98.6 F (37 C) (Oral)   Resp 18   SpO2 94%  Gen:   Awake, no distress   HEENT:  Atraumatic  Resp:  Normal effort  Cardiac:  Normal rate, radial pulses 2+ bilaterally MSK:   Moves extremities without difficulty, tenderness to the left lateral elbow without swelling, deformity, erythema Neuro:  Speech clear, grip strength equal bilaterally  Medical Decision Making  Medically screening exam initiated at 3:51 PM.  Appropriate orders placed.  [name] was informed that the remainder of the evaluation will be completed by another provider, this initial triage assessment does not replace that evaluation, and the importance of remaining in the ED until their evaluation is complete.  Clinical Impression    I agree with work-up including evaluation for ACS.     Lorayne Bender, PA-C 07/08/20 1610

## 2020-07-08 NOTE — Telephone Encounter (Signed)
Definitely needs ER evaluation  Zandra Abts MD

## 2020-07-16 ENCOUNTER — Ambulatory Visit: Payer: Medicare Other | Admitting: Family Medicine

## 2020-07-19 ENCOUNTER — Ambulatory Visit: Payer: Medicare Other | Admitting: Family Medicine

## 2020-07-29 NOTE — Progress Notes (Deleted)
Cardiology Office Note    Date:  07/30/2020   ID:  [name], DOB February 08, 1951, MRN [id number]   PCP:  Lemmie Evens, [place]  Cardiologist:  Carlyle Dolly, MD *** Advanced Practice Provider:  No care team member to display Electrophysiologist:  None   [phone]   No chief complaint on file.   History of Present Illness:  [name] is a 70 y.o. female with history of CAD status post NSTEMI 01/2016 treated with DES to the OM, unsuccessful PCI to the RCA with wire induced dissection managed medically, OM was felt to be the culprit.  If recurrent symptoms can consider repeat RCA intervention.  DES to the RCA 12/2019 RCA was prone to vasospasm during procedure, COPD, chronic diastolic CHF, hypertension, bradycardia resolved on lower dose Coreg, history of CVA, HLD, cirrhosis.  Patient last saw Dr. Harl Bowie 120/27/22 was doing well.  Maintained on Plavix monotherapy.  Patient called in chest pain 07/08/2020 and was directed to the emergency room she left without being seen..  Troponin was 3 other labs stable.  EKG without acute change    Past Medical History:  Diagnosis Date  . Anxiety   . Arthritis    "hands, arms, back, neck, knees, fingers" (01/21/2016)  . Atrial fibrillation (HCC)    ASA daiy; came off Coumadin shortly after stroke  . Breast cancer, left breast ([place]) 1970s; 2015  . Cancer of skin of leg    BLE  . 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HCC)    left  . Chronic lower back pain   . Chronic pain   . COPD (chronic obstructive pulmonary disease) ([place])   . Coronary artery disease   . CVA (cerebral vascular accident) [facility]) pt denies right brain CVA   Right brain secondary to right internal carotid artery dissection for which 2 stents were placed;F/u cerebral angiography in 05/2008-minimal plaque; healing of previously identified left internal carotid artery dissection  . Depression   . GERD (gastroesophageal reflux disease)    . Heart murmur   . Hepatic cirrhosis ([place]) 12/20/2017  . Hepatitis   . History of blood transfusion    "related to spleen"  . History of hiatal hernia   . History of kidney stones   . Hyperlipidemia   . Hypertension   . Hypothyroidism   . Kidney stone   . Melanoma of forearm, left ([place])   . Migraine    "a few migraines/year" (01/21/2016)  . NSTEMI (non-ST elevated myocardial infarction) ([place]) 01/21/2016   Archie Endo 01/21/2016  . Obesity   . Stroke [facility]) 04/2003   2 carotid artery stents in place; small left parietal and left hemispheric CVA.Phillis Knack 01/21/2016  . Tobacco use disorder    50 pack years; questionably discontinued in 2010  . Wears glasses     Past Surgical History:  Procedure Laterality Date  . ABDOMINAL HYSTERECTOMY  1978  . APPENDECTOMY    . BREAST BIOPSY Left 1970s; 2015  . BREAST LUMPECTOMY Left 1970s  . BREAST LUMPECTOMY WITH NEEDLE LOCALIZATION Left 07/07/2013   Procedure: BREAST LUMPECTOMY WITH NEEDLE LOCALIZATION;  Surgeon: Merrie Roof, MD;  Location: [facility];  Service: General;  Laterality: Left;  . CARDIAC CATHETERIZATION N/A 01/22/2016   Procedure: Left Heart Cath and Coronary Angiography;  Surgeon: Peter M Martinique, MD;  Location: [facility] CV LAB;  Service: Cardiovascular;  Laterality: N/A;  . CARDIAC CATHETERIZATION N/A 01/22/2016   Procedure: Coronary Stent Intervention;  Surgeon: Peter M Swaziland, MD;  Location: [facility] INVASIVE CV LAB;  Service: Cardiovascular;  Laterality: N/A;  . CAROTID STENT Left 04/2003   small left parietal and left hemispheric CVA/notes 07/29/2010  . CHOLECYSTECTOMY OPEN    . COLONOSCOPY  2011  . COLONOSCOPY N/A 05/10/2019   Procedure: COLONOSCOPY;  Surgeon: Malissa Hippo, MD;  [facility];  Service: Endoscopy;  Laterality: N/A;  1:45  . CORONARY ATHERECTOMY N/A 12/29/2019   Procedure: CORONARY ATHERECTOMY;  Surgeon: Corky Crafts, MD;  Location: [facility] INVASIVE CV LAB;  Service: Cardiovascular;   Laterality: N/A;  Prox RCA  . CORONARY STENT INTERVENTION N/A 12/29/2019   Procedure: CORONARY STENT INTERVENTION;  Surgeon: Corky Crafts, MD;  Location: [facility] INVASIVE CV LAB;  Service: Cardiovascular;  Laterality: N/A;  Prox RCA  . ESOPHAGOGASTRODUODENOSCOPY  08/06/2011   Procedure: ESOPHAGOGASTRODUODENOSCOPY (EGD);  Surgeon: Malissa Hippo, MD;  [facility];  Service: Endoscopy;  Laterality: N/A;  . ESOPHAGOGASTRODUODENOSCOPY N/A 05/10/2019   Procedure: ESOPHAGOGASTRODUODENOSCOPY (EGD);  Surgeon: Malissa Hippo, MD;  [facility];  Service: Endoscopy;  Laterality: N/A;  . FLEXIBLE SIGMOIDOSCOPY  08/06/2011   Procedure: FLEXIBLE SIGMOIDOSCOPY;  Surgeon: Malissa Hippo, MD;  [facility];  Service: Endoscopy;  Laterality: N/A;  . INGUINAL HERNIA REPAIR Left 1970s  . INTRAVASCULAR ULTRASOUND/IVUS N/A 12/29/2019   Procedure: Intravascular Ultrasound/IVUS;  Surgeon: Corky Crafts, MD;  Location: [facility] INVASIVE CV LAB;  Service: Cardiovascular;  Laterality: N/A;  . LARYNX SURGERY  1970s   Polyps excised  . LEFT HEART CATH AND CORONARY ANGIOGRAPHY N/A 12/29/2019   Procedure: LEFT HEART CATH AND CORONARY ANGIOGRAPHY;  Surgeon: Corky Crafts, MD;  Location: [facility] INVASIVE CV LAB;  Service: Cardiovascular;  Laterality: N/A;  . MALONEY DILATION  05/10/2019   Procedure: [name] DILATION;  Surgeon: Malissa Hippo, MD;  [facility];  Service: Endoscopy;;  . MELANOMA EXCISION Left ~ 2016   forearm  . POLYPECTOMY  05/10/2019   Procedure: POLYPECTOMY;  Surgeon: Malissa Hippo, MD;  [facility];  Service: Endoscopy;;  colon  . 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   spontaneous rupture  . TEMPORARY PACEMAKER N/A 12/29/2019   Procedure: TEMPORARY PACEMAKER;  Surgeon: Corky Crafts, MD;  Location: [facility] INVASIVE CV LAB;  Service: Cardiovascular;  Laterality: N/A;    Current Medications: No outpatient medications have been marked as taking  for the 08/05/20 encounter (Appointment) with Dyann Kief, PA-C.   Current Facility-Administered Medications for the 08/05/20 encounter (Appointment) with Dyann Kief, PA-C  Medication  . sodium chloride flush (NS) 0.9 % injection 3 mL     Allergies:   Gabapentin, Prednisone, Tape, and Latex   Social History   Socioeconomic History  . Marital status: Divorced    Spouse name: Not on file  . Number of children: 2  . Years of education: Not on file  . Highest education level: Not on file  Occupational History  . Occupation: Disabled  . Occupation: retired  Tobacco Use  . Smoking status: Former Smoker    Packs/day: 1.00    Years: 40.00    Pack years: 40.00    Types: Cigarettes    Quit date: 07/06/2007    Years since quitting: 13.0  . Smokeless tobacco: Never Used  Vaping Use  . Vaping Use: Never used  Substance and Sexual Activity  . Alcohol use: No    Alcohol/week: 0.0 standard drinks  . Drug use:  No  . Sexual activity: Not Currently  Other Topics Concern  . Not on file  Social History Narrative   ** Merged History Encounter **       Social Determinants of Health   Financial Resource Strain: Not on file  Food Insecurity: Not on file  Transportation Needs: Not on file  Physical Activity: Not on file  Stress: Not on file  Social Connections: Not on file     Family History:  The patient's ***family history includes Arthritis in an other family member; Cancer in her father, sister, sister, and another family member; Dementia in her sister; Diabetes in an other family member; Heart disease in an other family member; Heart failure in her brother, father, and mother; Kidney disease in an other family member.   ROS:   Please see the history of present illness.    ROS All other systems reviewed and are negative.   PHYSICAL EXAM:   VS:  There were no vitals taken for this visit.  Physical Exam  GEN: Well nourished, well developed, in no acute distress  HEENT:  normal  Neck: no JVD, carotid bruits, or masses Cardiac:RRR; no murmurs, rubs, or gallops  Respiratory:  clear to auscultation bilaterally, normal work of breathing GI: soft, nontender, nondistended, + BS Ext: without cyanosis, clubbing, or edema, Good distal pulses bilaterally MS: no deformity or atrophy  Skin: warm and dry, no rash Neuro:  Alert and Oriented x 3, Strength and sensation are intact Psych: euthymic mood, full affect  Wt Readings from Last 3 Encounters:  04/11/20 209 lb (94.8 kg)  04/11/20 211 lb (95.7 kg)  01/08/20 210 lb 12.8 oz (95.6 kg)      Studies/Labs Reviewed:   EKG:  EKG is*** ordered today.  The ekg ordered today demonstrates ***  Recent Labs: 04/16/2020: ALT 17 07/08/2020: BUN 9; Creatinine, Ser 0.58; Hemoglobin 14.9; Platelets 400; Potassium 3.5; Sodium 136   Lipid Panel    Component Value Date/Time   CHOL 128 04/08/2020 1045   TRIG 115 04/08/2020 1045   HDL 38 (L) 04/08/2020 1045   CHOLHDL 3.4 04/08/2020 1045   CHOLHDL 3.4 03/27/2016 1134   VLDL 20 03/27/2016 1134   LDLCALC 69 04/08/2020 1045    Additional studies/ records that were reviewed today include:   Diagnostic Studies 01/2016 cath  The left ventricular systolic function is normal.  LV end diastolic pressure is normal.  The left ventricular ejection fraction is 55-65% by visual estimate.  Prox RCA lesion, 90 %stenosed.  1st Diag lesion, 60 %stenosed.  Prox LAD to Mid LAD lesion, 20 %stenosed.  2nd Diag lesion, 40 %stenosed.  Ost Ramus to Ramus lesion, 50 %stenosed.  1st Mrg lesion, 99 %stenosed.  A STENT SYNERGY DES 2.75X20 drug eluting stent was successfully placed.  Post intervention, there is a 0% residual stenosis.  Ost RCA to Prox RCA lesion, 90 %stenosed.  Post intervention, there is a 90% residual stenosis with dissection from the proximal to mid RCA.   1. Severe 2 vessel obstructive CAD 2. Normal LV function and LV EDP 3. Successful stenting of the first OM  with DES. This appears to be the culprit vessel. 4. Unsuccessful PCI of the RCA due to wire induced dissection of the vessel.    Plan: DAPT for one year. Aggressive BP control. Will continue IV Ntg overnight. Despite RCA dissection patient is pain free, hemodynamically stable and has a normal Ecg. If her symptoms remain stable I would treat medically and allow  the RCA to heal. If she continues to have angina attempt at PCI of the RCA could be considered once the vessel has had time to heal - 6-8 weeks.      01/2016 echo Study Conclusions   - Left ventricle: The cavity size was normal. Systolic function was   vigorous. The estimated ejection fraction was in the range of 65%   to 70%. Wall motion was normal; there were no regional wall   motion abnormalities. There was an increased relative   contribution of atrial contraction to ventricular filling.   Doppler parameters are consistent with abnormal left ventricular   relaxation (grade 1 diastolic dysfunction). Doppler parameters   are consistent with high ventricular filling pressure. - Tricuspid valve: There was trivial regurgitation. - Pulmonary arteries: PA peak pressure: 32 mm Hg (S). - Pericardium, extracardiac: A trivial, free-flowing pericardial   effusion was identified along the right ventricular free wall.   The fluid had no internal echoes.     12/2019 cath  Previously placed 1st Mrg drug eluting stent is widely patent.  Ost RCA to Prox RCA lesion is 90% stenosed.  After orbital atherectomy drug-eluting stent was successfully placed using a STENT RESOLUTE ONYX 3.0X26, and postdilated to 3.75 mm. Stent was optimized with IVUS.  Post intervention, there is a 0% residual stenosis.  The left ventricular systolic function is normal.  LV end diastolic pressure is normal.  The left ventricular ejection fraction is 55-65% by visual estimate.  There is no aortic valve stenosis.  RCA was prone to vasospasm during the PCI.    Continue dual antiplatelet therapy for at least 6 months.  Consider clopidogrel monotherapy long-term given some diffuse disease in her LAD system.  Continue with attempts at aggressive secondary prevention.   Plan for same day discharge.        Risk Assessment/Calculations:   {Does this patient have ATRIAL FIBRILLATION?:[id number]}     ASSESSMENT:    1. Coronary artery disease involving native coronary artery of native heart without angina pectoris   2. Essential hypertension   3. Chronic diastolic CHF (congestive heart failure) ([place])   4. Hyperlipidemia, unspecified hyperlipidemia type      PLAN:  In order of problems listed above:   CAD status post NSTEMI 01/2016 treated with DES to the OM, unsuccessful PCI to the RCA with wire induced dissection managed medically, OM was felt to be the culprit.  DES to the RCA 12/2019 RCA was prone to vasospasm during procedure  HTN  Chronic diastolic CHF  HLD  Shared Decision Making/Informed Consent   {Are you ordering a CV Procedure (e.g. stress test, cath, DCCV, TEE, etc)?   Press F2        :[id number]}    Medication Adjustments/Labs and Tests Ordered: Current medicines are reviewed at length with the patient today.  Concerns regarding medicines are outlined above.  Medication changes, Labs and Tests ordered today are listed in the Patient Instructions below. There are no Patient Instructions on file for this visit.   Sumner Boast, PA-C  07/30/2020 8:54 AM    [facility] [address], [place], [place]  [address] Phone: [phone]; Fax: [phone]

## 2020-07-30 ENCOUNTER — Ambulatory Visit: Payer: Medicare Other | Admitting: Physician Assistant

## 2020-07-31 ENCOUNTER — Encounter: Payer: Self-pay | Admitting: Cardiology

## 2020-07-31 ENCOUNTER — Ambulatory Visit (INDEPENDENT_AMBULATORY_CARE_PROVIDER_SITE_OTHER): Payer: Medicare Other | Admitting: Cardiology

## 2020-07-31 ENCOUNTER — Other Ambulatory Visit: Payer: Self-pay

## 2020-07-31 VITALS — BP 134/78 | HR 87 | Ht 66.0 in | Wt 203.0 lb

## 2020-07-31 DIAGNOSIS — R079 Chest pain, unspecified: Secondary | ICD-10-CM | POA: Diagnosis not present

## 2020-07-31 DIAGNOSIS — I25118 Atherosclerotic heart disease of native coronary artery with other forms of angina pectoris: Secondary | ICD-10-CM | POA: Diagnosis not present

## 2020-07-31 MED ORDER — ISOSORBIDE MONONITRATE ER 60 MG PO TB24: 60.0000 mg | ORAL_TABLET | Freq: Every day | ORAL | 3 refills | Status: DC

## 2020-07-31 NOTE — Patient Instructions (Signed)
Medication Instructions:  Your physician has recommended you make the following change in your medication:  STOP Isosorbide Mononitrate (ISMO, Monoket) 20 mg tablets START Isosorbide Mononitrate (Imdur) 60 mg tablets once daily.  *If you need a refill on your cardiac medications before your next appointment, please call your pharmacy*   Lab Work: None If you have labs (blood work) drawn today and your tests are completely normal, you will receive your results only by: Marland Kitchen MyChart Message (if you have MyChart) OR . A paper copy in the mail If you have any lab test that is abnormal or we need to change your treatment, we will call you to review the results.   Testing/Procedures: Your physician has requested that you have a lexiscan myoview. For further information please visit [email or web address]. Please follow instruction sheet, as given.     Follow-Up: At [facility], you and your health needs are our priority.  As part of our continuing mission to provide you with exceptional heart care, we have created designated Provider Care Teams.  These Care Teams include your primary Cardiologist (physician) and Advanced Practice Providers (APPs -  Physician Assistants and Nurse Practitioners) who all work together to provide you with the care you need, when you need it.  We recommend signing up for the patient portal called "MyChart".  Sign up information is provided on this After Visit Summary.  MyChart is used to connect with patients for Virtual Visits (Telemedicine).  Patients are able to view lab/test results, encounter notes, upcoming appointments, etc.  Non-urgent messages can be sent to your provider as well.   To learn more about what you can do with MyChart, go to [email or web address].    Your next appointment:   6 week(s)  The format for your next appointment:   In Person  Provider:   Ermalinda Barrios, PA-C or Bernerd Pho, PA-C   Other Instructions Hold Imdur day of  procedure

## 2020-07-31 NOTE — Progress Notes (Signed)
Clinical Summary Ms. Piech is a 70 y.o.female seen today for follow up of the following medical problems. This is a focused visit for recent chest pains.   1. CAD - admit 01/2016 with NSTEMI. Full cath report below. Received DES to OM. Unsuccesful PCI to RCA with wire induced dissection, managed medically. The OM was thought to be the culprit. If recurring symptoms can consider repeat RCA intervention. .   12/2019 cath DES to RCA. RCA was prone to vasospasm during procedure  07/08/20 ER visit with chest pain - did not complete workup in ER, left ER - trop was neg, EKG SR no ischemic changes  - pain left upper arm and elbow, similar to prior MI - 10/10 in severity at times. Chronic fatigue. Can feel diaphoretic. Tender to palpation. Worst with position. Often occurs at rest, often awakens her at night.  - better with NG after 5 minutes.  - heavy stress at home, he is on dialysis and confrontational.     Past Medical History:  Diagnosis Date  . Anxiety   . Arthritis    "hands, arms, back, neck, knees, fingers" (01/21/2016)  . Atrial fibrillation (HCC)    ASA daiy; came off Coumadin shortly after stroke  . Breast cancer, left breast ([place]) 1970s; 2015  . Cancer of skin of leg    BLE  . 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HCC)    left  . Chronic lower back pain   . Chronic pain   . COPD (chronic obstructive pulmonary disease) ([place])   . Coronary artery disease   . CVA (cerebral vascular accident) [facility]) pt denies right brain CVA   Right brain secondary to right internal carotid artery dissection for which 2 stents were placed;F/u cerebral angiography in 05/2008-minimal plaque; healing of previously identified left internal carotid artery dissection  . Depression   . GERD (gastroesophageal reflux disease)   . Heart murmur   . Hepatic cirrhosis ([place]) 12/20/2017  . Hepatitis   . History of blood transfusion    "related to spleen"  . History of hiatal hernia   . History of kidney  stones   . Hyperlipidemia   . Hypertension   . Hypothyroidism   . Kidney stone   . Melanoma of forearm, left ([place])   . Migraine    "a few migraines/year" (01/21/2016)  . NSTEMI (non-ST elevated myocardial infarction) ([place]) 01/21/2016   Archie Endo 01/21/2016  . Obesity   . Stroke [facility]) 04/2003   2 carotid artery stents in place; small left parietal and left hemispheric CVA.Phillis Knack 01/21/2016  . Tobacco use disorder    50 pack years; questionably discontinued in 2010  . Wears glasses      Allergies  Allergen Reactions  . Gabapentin Nausea And Vomiting  . Prednisone Palpitations  . Tape Other (See Comments)    Causes blisters to form  . Latex Rash and Other (See Comments)    Blisters     Current Outpatient Medications  Medication Sig Dispense Refill  . azaTHIOprine (IMURAN) 50 MG tablet TAKE 1 AND 1/2 TABLETS BY MOUTH ONCE DAILY. 45 tablet 5  . losartan (COZAAR) 50 MG tablet TAKE ONE TABLET BY MOUTH ONCE DAILY. 90 tablet 3  . albuterol (PROVENTIL HFA;VENTOLIN HFA) 108 (90 Base) MCG/ACT inhaler Inhale 2 puffs into the lungs every 6 (six) hours as needed for wheezing or shortness of breath.    Marland Kitchen albuterol (PROVENTIL) (2.5 MG/3ML) 0.083% nebulizer solution Take 2.5 mg by nebulization every 6 (six) hours  as needed for wheezing or shortness of breath.    Marland Kitchen amLODipine (NORVASC) 5 MG tablet Take 1 tablet (5 mg total) by mouth daily. 90 tablet 3  . aspirin EC 81 MG tablet Take 1 tablet (81 mg total) by mouth daily.    Marland Kitchen aspirin-acetaminophen-caffeine (EXCEDRIN MIGRAINE) 250-250-65 MG tablet Take 1 tablet by mouth every 6 (six) hours as needed for headache.    Marland Kitchen atorvastatin (LIPITOR) 80 MG tablet TAKE ONE TABLET BY MOUTH DAILY AT 6 P.M. 90 tablet 1  . carvedilol (COREG) 3.125 MG tablet TAEK 1 TABLET BY MOUTH TWICE DAILY WITH MEALS. 180 tablet 3  . cetirizine (ZYRTEC) 10 MG tablet Take 10 mg by mouth daily.    . clopidogrel (PLAVIX) 75 MG tablet Take 1 tablet (75 mg total) by mouth daily. 30  tablet 11  . clorazepate (TRANXENE) 7.5 MG tablet Take 7.5 mg by mouth 3 (three) times daily as needed for anxiety.     . famotidine (PEPCID) 20 MG tablet Take 1 tablet (20 mg total) by mouth at bedtime. 30 tablet 11  . furosemide (LASIX) 40 MG tablet Take 40 mg by mouth daily as needed for fluid (leg/hand swelling.).    Marland Kitchen glipiZIDE (GLUCOTROL) 5 MG tablet Take 1 tablet (5 mg total) by mouth daily before breakfast. 30 tablet 3  . HYDROcodone-acetaminophen (NORCO) 10-325 MG tablet Take 1 tablet by mouth 3 (three) times daily as needed for moderate pain.     . isosorbide mononitrate (ISMO,MONOKET) 20 MG tablet Take 20 mg by mouth 2 (two) times daily at 10 AM and 5 PM.    . levothyroxine (SYNTHROID, LEVOTHROID) 50 MCG tablet Take 50 mcg by mouth daily before breakfast.     . nitroGLYCERIN (NITROSTAT) 0.4 MG SL tablet Place 0.4 mg under the tongue every 5 (five) minutes as needed for chest pain.      Marland Kitchen ondansetron (ZOFRAN) 4 MG tablet Take 1 tablet (4 mg total) by mouth 3 (three) times daily as needed. 30 tablet 1  . pantoprazole (PROTONIX) 40 MG tablet Take 1 tablet (40 mg total) by mouth daily. 30 tablet 11  . senna (SENOKOT) 8.6 MG tablet Take 2 tablets by mouth at bedtime.     Marland Kitchen SPIRIVA RESPIMAT 2.5 MCG/ACT AERS Inhale 2 puffs into the lungs daily as needed (shortness of breath).      Current Facility-Administered Medications  Medication Dose Route Frequency Provider Last Rate Last Admin  . sodium chloride flush (NS) 0.9 % injection 3 mL  3 mL Intravenous Q12H Devani Odonnel, Alphonse Guild, MD         Past Surgical History:  Procedure Laterality Date  . ABDOMINAL HYSTERECTOMY  1978  . APPENDECTOMY    . BREAST BIOPSY Left 1970s; 2015  . BREAST LUMPECTOMY Left 1970s  . BREAST LUMPECTOMY WITH NEEDLE LOCALIZATION Left 07/07/2013   Procedure: BREAST LUMPECTOMY WITH NEEDLE LOCALIZATION;  Surgeon: Merrie Roof, MD;  Location: [facility];  Service: General;  Laterality: Left;  . CARDIAC  CATHETERIZATION N/A 01/22/2016   Procedure: Left Heart Cath and Coronary Angiography;  Surgeon: Peter M Martinique, MD;  Location: [place] [facility];  Service: Cardiovascular;  Laterality: N/A;  . CARDIAC CATHETERIZATION N/A 01/22/2016   Procedure: Coronary Stent Intervention;  Surgeon: Peter M Martinique, MD;  Location: [place] [facility];  Service: Cardiovascular;  Laterality: N/A;  . CAROTID STENT Left 04/2003   small left parietal and left hemispheric CVA/notes 07/29/2010  . CHOLECYSTECTOMY OPEN    .  COLONOSCOPY  2011  . COLONOSCOPY N/A 05/10/2019   Procedure: COLONOSCOPY;  Surgeon: Rogene Houston, MD;  [facility];  Service: Endoscopy;  Laterality: N/A;  1:45  . CORONARY ATHERECTOMY N/A 12/29/2019   Procedure: CORONARY ATHERECTOMY;  Surgeon: Jettie Booze, MD;  Location: [place] [facility];  Service: Cardiovascular;  Laterality: N/A;  Prox RCA  . CORONARY STENT INTERVENTION N/A 12/29/2019   Procedure: CORONARY STENT INTERVENTION;  Surgeon: Jettie Booze, MD;  Location: [place] [facility];  Service: Cardiovascular;  Laterality: N/A;  Prox RCA  . ESOPHAGOGASTRODUODENOSCOPY  08/06/2011   Procedure: ESOPHAGOGASTRODUODENOSCOPY (EGD);  Surgeon: Rogene Houston, MD;  [facility];  Service: Endoscopy;  Laterality: N/A;  . ESOPHAGOGASTRODUODENOSCOPY N/A 05/10/2019   Procedure: ESOPHAGOGASTRODUODENOSCOPY (EGD);  Surgeon: Rogene Houston, MD;  [facility];  Service: Endoscopy;  Laterality: N/A;  . FLEXIBLE SIGMOIDOSCOPY  08/06/2011   Procedure: FLEXIBLE SIGMOIDOSCOPY;  Surgeon: Rogene Houston, MD;  [facility];  Service: Endoscopy;  Laterality: N/A;  . INGUINAL HERNIA REPAIR Left 1970s  . INTRAVASCULAR ULTRASOUND/IVUS N/A 12/29/2019   Procedure: Intravascular Ultrasound/IVUS;  Surgeon: Jettie Booze, MD;  Location: [place] [facility];  Service: Cardiovascular;  Laterality: N/A;  . LARYNX SURGERY  1970s   Polyps excised  . LEFT HEART CATH  AND CORONARY ANGIOGRAPHY N/A 12/29/2019   Procedure: LEFT HEART CATH AND CORONARY ANGIOGRAPHY;  Surgeon: Jettie Booze, MD;  Location: [place] [facility];  Service: Cardiovascular;  Laterality: N/A;  . MALONEY DILATION  05/10/2019   Procedure: Venia Minks DILATION;  Surgeon: Rogene Houston, MD;  [facility];  Service: Endoscopy;;  . MELANOMA EXCISION Left ~ 2016   forearm  . POLYPECTOMY  05/10/2019   Procedure: POLYPECTOMY;  Surgeon: Rogene Houston, MD;  [facility];  Service: Endoscopy;;  colon  . 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   spontaneous rupture  . TEMPORARY PACEMAKER N/A 12/29/2019   Procedure: TEMPORARY PACEMAKER;  Surgeon: Jettie Booze, MD;  Location: [place] [facility];  Service: Cardiovascular;  Laterality: N/A;     Allergies  Allergen Reactions  . Gabapentin Nausea And Vomiting  . Prednisone Palpitations  . Tape Other (See Comments)    Causes blisters to form  . Latex Rash and Other (See Comments)    Blisters      Family History  Problem Relation Age of Onset  . Heart failure Mother   . Cancer Father   . Heart failure Father   . Cancer Sister   . Dementia Sister   . Heart disease Other   . Arthritis Other   . Cancer Other   . Diabetes Other   . Kidney disease Other   . Cancer Sister   . Heart failure Brother      Social History Ms. Jenniges reports that she quit smoking about 13 years ago. Her smoking use included cigarettes. She has a 40.00 pack-year smoking history. She has never used smokeless tobacco. Ms. Davern reports no history of alcohol use.   Review of Systems CONSTITUTIONAL: No weight loss, fever, chills, weakness or fatigue.  HEENT: Eyes: No visual loss, blurred vision, double vision or yellow sclerae.No hearing loss, sneezing, congestion, runny nose or sore throat.  SKIN: No rash or itching.  CARDIOVASCULAR: per hpi RESPIRATORY: No shortness of breath, cough or sputum.  GASTROINTESTINAL: No anorexia,  nausea, vomiting or diarrhea. No abdominal pain or blood.  GENITOURINARY: No burning on urination,  no polyuria NEUROLOGICAL: No headache, dizziness, syncope, paralysis, ataxia, numbness or tingling in the extremities. No change in bowel or bladder control.  MUSCULOSKELETAL: No muscle, back pain, joint pain or stiffness.  LYMPHATICS: No enlarged nodes. No history of splenectomy.  PSYCHIATRIC: No history of depression or anxiety.  ENDOCRINOLOGIC: No reports of sweating, cold or heat intolerance. No polyuria or polydipsia.  Marland Kitchen   Physical Examination Today's Vitals   07/31/20 1420  BP: 134/78  Pulse: 87  SpO2: 94%  Weight: 203 lb (92.1 kg)  Height: 5\' 6"  (1.676 m)   Body mass index is 32.77 kg/m.  Gen: resting comfortably, no acute distress HEENT: no scleral icterus, pupils equal round and reactive, no palptable cervical adenopathy,  CV: RRR, no m/r/g, no jvd Resp: Clear to auscultation bilaterally GI: abdomen is soft, non-tender, non-distended, normal bowel sounds, no hepatosplenomegaly MSK: extremities are warm, no edema.  Skin: warm, no rash Neuro:  no focal deficits Psych: appropriate affect   Diagnostic Studies 01/2016 cath  The left ventricular systolic function is normal.  LV end diastolic pressure is normal.  The left ventricular ejection fraction is 55-65% by visual estimate.  Prox RCA lesion, 90 %stenosed.  1st Diag lesion, 60 %stenosed.  Prox LAD to Mid LAD lesion, 20 %stenosed.  2nd Diag lesion, 40 %stenosed.  Ost Ramus to Ramus lesion, 50 %stenosed.  1st Mrg lesion, 99 %stenosed.  A STENT SYNERGY DES 2.75X20 drug eluting stent was successfully placed.  Post intervention, there is a 0% residual stenosis.  Ost RCA to Prox RCA lesion, 90 %stenosed.  Post intervention, there is a 90% residual stenosis with dissection from the proximal to mid RCA.  1. Severe 2 vessel obstructive CAD 2. Normal LV function and LV EDP 3. Successful stenting of the  first OM with DES. This appears to be the culprit vessel. 4. Unsuccessful PCI of the RCA due to wire induced dissection of the vessel.   Plan: DAPT for one year. Aggressive BP control. Will continue IV Ntg overnight. Despite RCA dissection patient is pain free, hemodynamically stable and has a normal Ecg. If her symptoms remain stable I would treat medically and allow the RCA to heal. If she continues to have angina attempt at PCI of the RCA could be considered once the vessel has had time to heal - 6-8 weeks.    01/2016 echo Study Conclusions  - Left ventricle: The cavity size was normal. Systolic function was vigorous. The estimated ejection fraction was in the range of 65% to 70%. Wall motion was normal; there were no regional wall motion abnormalities. There was an increased relative contribution of atrial contraction to ventricular filling. Doppler parameters are consistent with abnormal left ventricular relaxation (grade 1 diastolic dysfunction). Doppler parameters are consistent with high ventricular filling pressure. - Tricuspid valve: There was trivial regurgitation. - Pulmonary arteries: PA peak pressure: 32 mm Hg (S). - Pericardium, extracardiac: A trivial, free-flowing pericardial effusion was identified along the right ventricular free wall. The fluid had no internal echoes.   12/2019 cath  Previously placed 1st Mrg drug eluting stent is widely patent.  Ost RCA to Prox RCA lesion is 90% stenosed.  After orbital atherectomy drug-eluting stent was successfully placed using a STENT RESOLUTE ONYX 3.0X26, and postdilated to 3.75 mm. Stent was optimized with IVUS.  Post intervention, there is a 0% residual stenosis.  The left ventricular systolic function is normal.  LV end diastolic pressure is normal.  The left ventricular ejection fraction is 55-65% by  visual estimate.  There is no aortic valve stenosis.  RCA was prone to vasospasm during  the PCI.  Continue dual antiplatelet therapy for at least 6 months. Consider clopidogrel monotherapy long-term given some diffuse disease in her LAD system. Continue with attempts at aggressive secondary prevention.  Plan for same day discharge.    Assessment and Plan  1. CAD - recent PCI 12/2019 as reported above - recent chest pains. Several atypical features however better with nitro and similar to her chest pain when she had her MI - I think could be related to vasospasm, this was noted at her last cath. Cannot exclude new fixed obstructive disease but seems less likely based on symptoms - will obtain lexiscan to assess for recurrent ischemia - will changer her isosorbide to long acting and increase to 60mg  daily.       Arnoldo Lenis, M.D.

## 2020-08-05 ENCOUNTER — Other Ambulatory Visit: Payer: Self-pay | Admitting: Cardiology

## 2020-08-05 ENCOUNTER — Ambulatory Visit: Payer: Medicare Other | Admitting: Physician Assistant

## 2020-08-05 ENCOUNTER — Other Ambulatory Visit: Payer: Self-pay | Admitting: Family Medicine

## 2020-08-05 DIAGNOSIS — I10 Essential (primary) hypertension: Secondary | ICD-10-CM

## 2020-08-05 DIAGNOSIS — I5032 Chronic diastolic (congestive) heart failure: Secondary | ICD-10-CM

## 2020-08-05 DIAGNOSIS — I2510 Atherosclerotic heart disease of native coronary artery without angina pectoris: Secondary | ICD-10-CM

## 2020-08-05 DIAGNOSIS — E785 Hyperlipidemia, unspecified: Secondary | ICD-10-CM

## 2020-08-10 IMAGING — CT CT NECK W/ CM
4 of 5 series · 16 of 33 positions shown, 18 images · IV contrast (omnipaque)
Comparison: 08/20/2014

CLINICAL DATA: Neck pain and pressure over the last 2 days. Gagging
and vomiting. Right-sided pressure.

EXAM:
CT NECK WITH CONTRAST
TECHNIQUE: Multidetector CT imaging of the neck was performed using the
standard protocol following the bolus administration of intravenous
contrast.
CONTRAST:  75mL OMNIPAQUE IOHEXOL 300 MG/ML  SOLN

[Series 2: axial neck · axial · 0.51mm/px · z∈[-110,+6]mm · 4 of 98 slices shown, 5 images]
[im 20/98  soft-tissue]
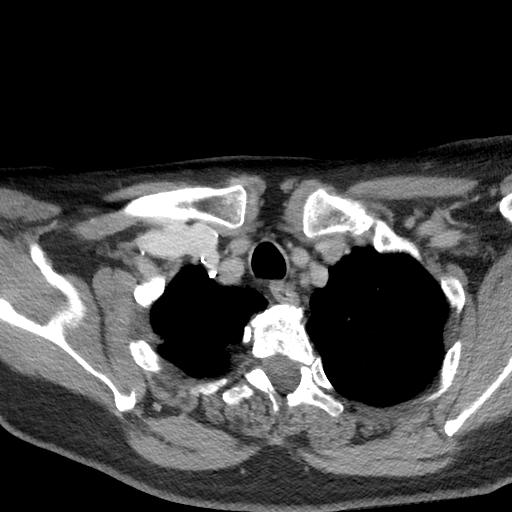
[im 20/98  bone]
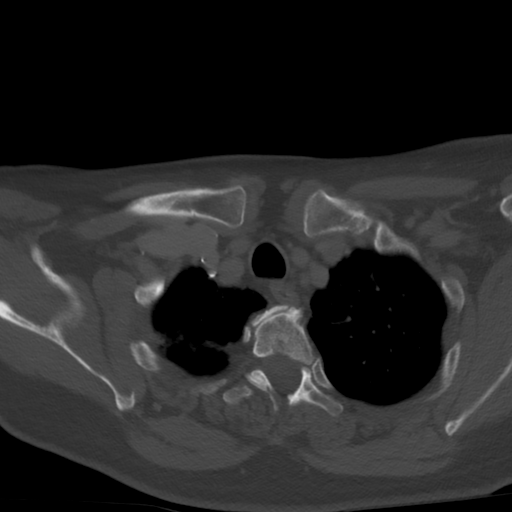
[im 39/98  bone]
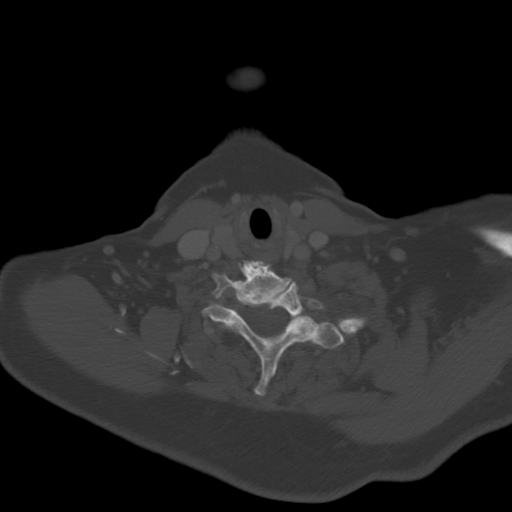
[im 59/98  bone]
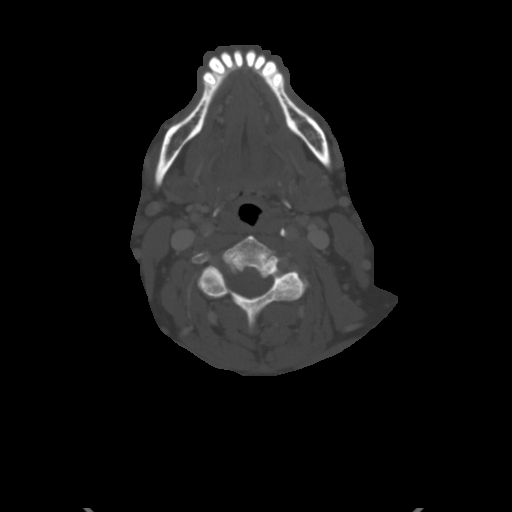
[im 78/98  bone]
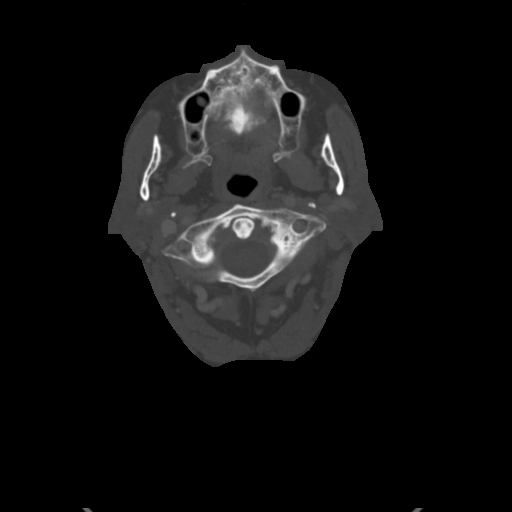

[Series 6: coronal neck · coronal · 0.41mm/px · 3 of 116 slices shown]
[im 24/116  bone]
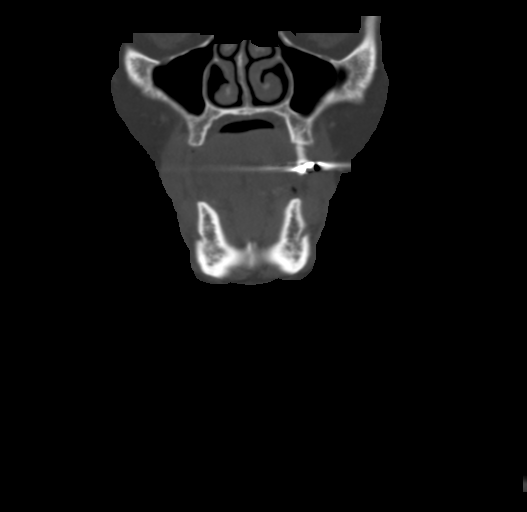
[im 47/116  bone]
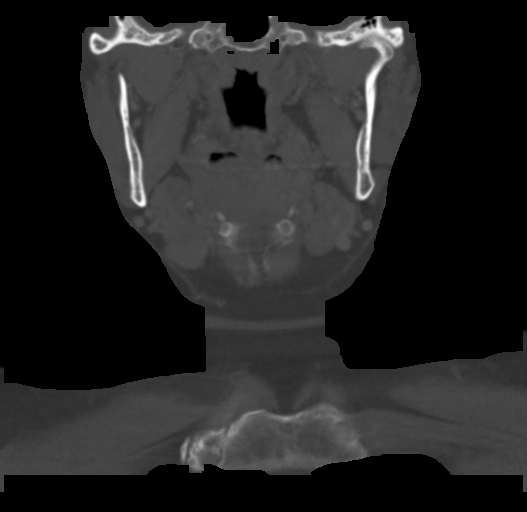
[im 70/116  bone]
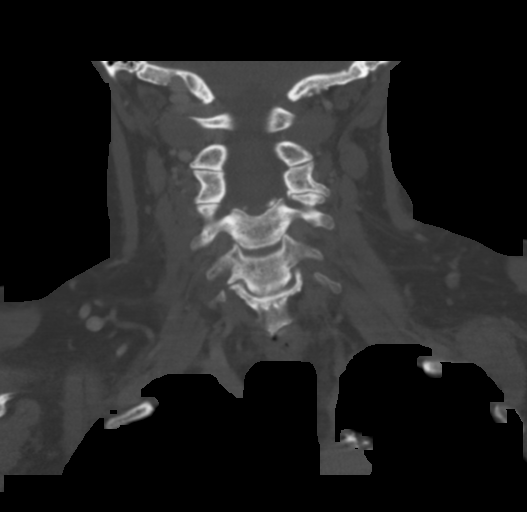

[Series 7: sagittal neck · sagittal · 0.38mm/px · 5 of 101 slices shown, 6 images]
[im 34/101  bone]
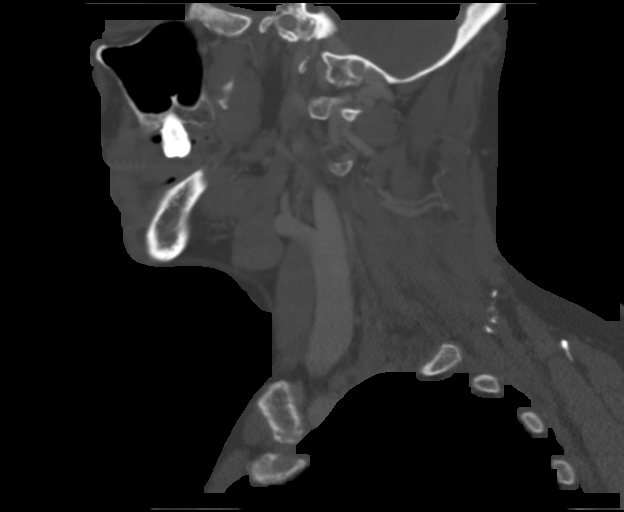
[im 42/101  bone]
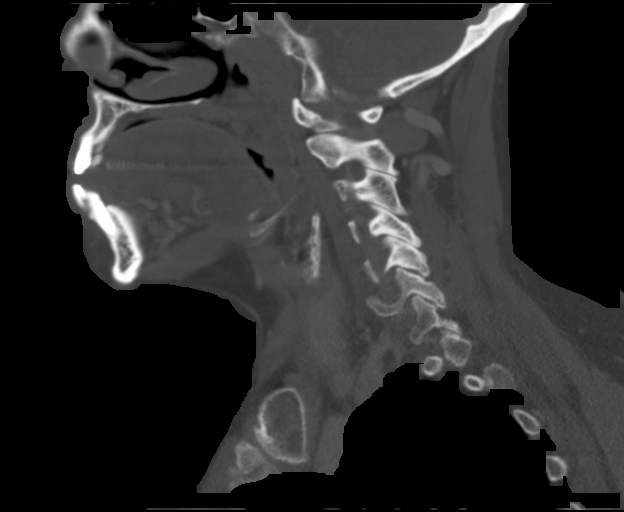
[im 51/101  soft-tissue]
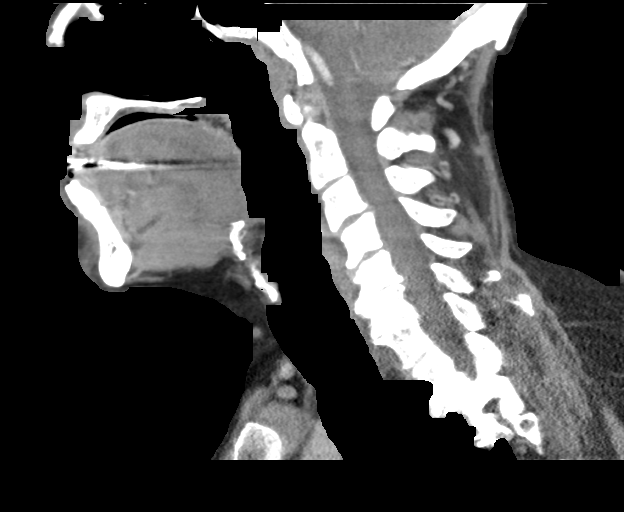
[im 51/101  bone]
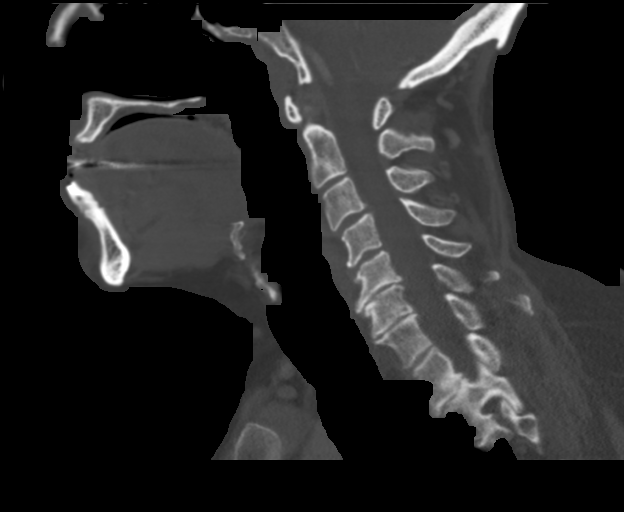
[im 59/101  bone]
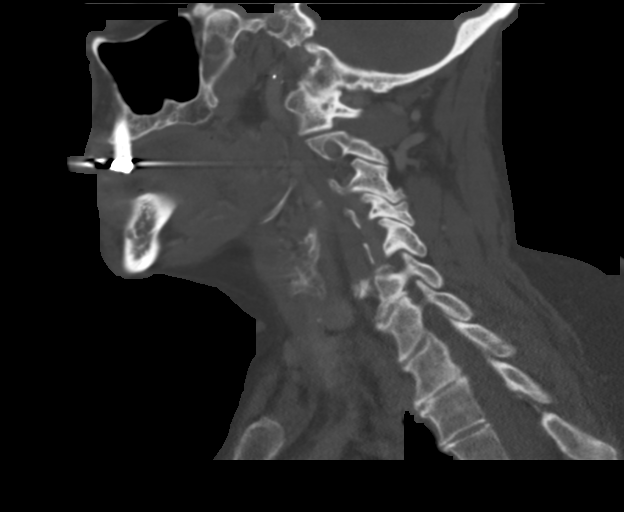
[im 67/101  bone]
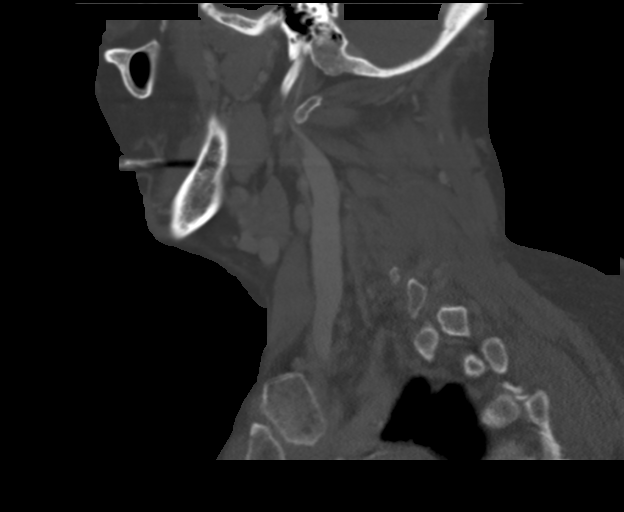

[Series 8: orthogonal ax · axial · 0.39mm/px · z∈[-106,+8]mm · 4 of 96 slices shown]
[im 20/96  bone]
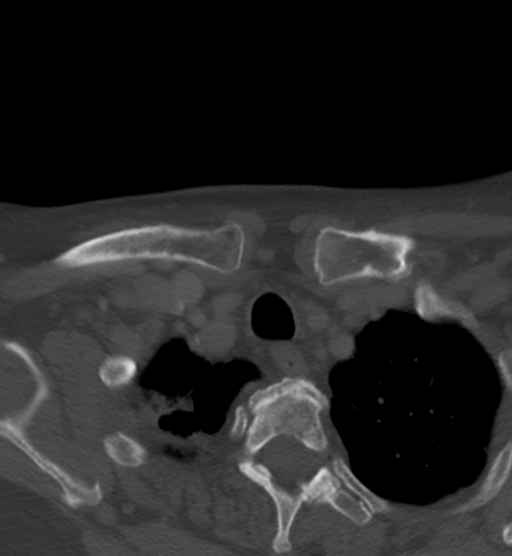
[im 39/96  bone]
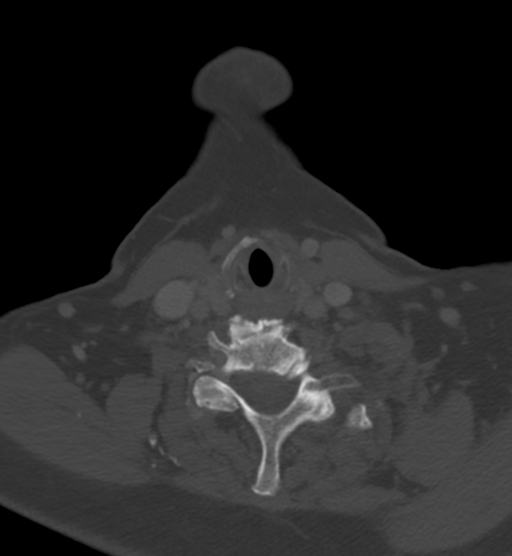
[im 58/96  bone]
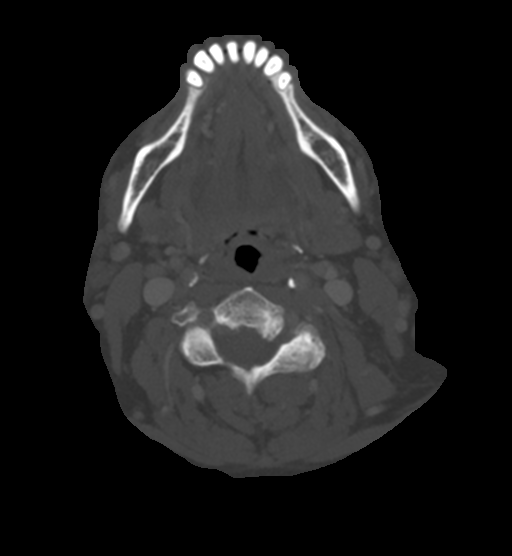
[im 77/96  bone]
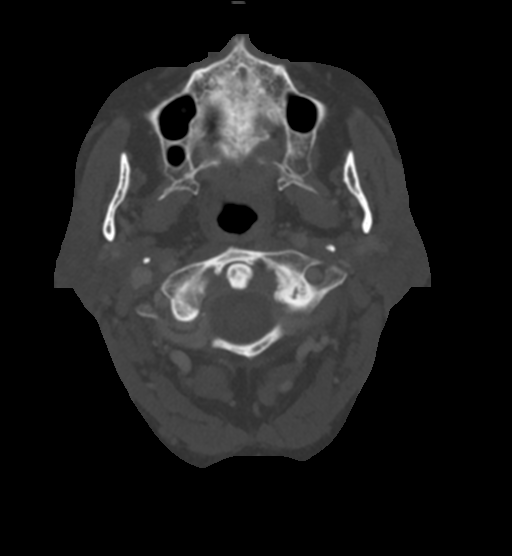

[16 of 33 positions shown; findings below may reference images not displayed]

FINDINGS: Pharynx and larynx: No evidence of mucosal or submucosal lesion.

Salivary glands: Parotid and submandibular glands are normal.

Thyroid: Normal

Lymph nodes: No enlarged or low-density nodes on either side of the
neck.

Vascular: No significant arterial or venous finding. Ordinary
atherosclerotic change at the carotid bifurcations.

Limited intracranial: Normal

Visualized orbits: Normal

Mastoids and visualized paranasal sinuses: Clear

Skeleton: Mid cervical spondylosis and ordinary facet
osteoarthritis.

Upper chest: Emphysema. Pleural and parenchymal scarring.

Other: None
IMPRESSION: No cause of the presenting symptoms is identified. No evidence of
mass or lymphadenopathy. No evidence of mucosal or submucosal
lesion.

## 2020-08-21 ENCOUNTER — Encounter (HOSPITAL_COMMUNITY)
Admission: RE | Admit: 2020-08-21 | Discharge: 2020-08-21 | Disposition: A | Discharge status: Home / Self Care | Payer: Medicare Other | Source: Ambulatory Visit | Attending: Cardiology | Admitting: Cardiology

## 2020-08-21 ENCOUNTER — Encounter (HOSPITAL_BASED_OUTPATIENT_CLINIC_OR_DEPARTMENT_OTHER)
Admission: RE | Admit: 2020-08-21 | Discharge: 2020-08-21 | Disposition: A | Discharge status: Home / Self Care | Payer: Medicare Other | Source: Ambulatory Visit | Attending: Cardiology | Admitting: Cardiology

## 2020-08-21 DIAGNOSIS — R079 Chest pain, unspecified: Secondary | ICD-10-CM | POA: Diagnosis not present

## 2020-08-21 LAB — NM MYOCAR MULTI W/SPECT W/WALL MOTION / EF
LV dias vol: 41 mL (ref 46–106)
LV sys vol: 8 mL
Peak HR: 93 {beats}/min
RATE: 0.31
Rest HR: 81 {beats}/min
SDS: 6
SRS: 2
SSS: 7
TID: 1.18

## 2020-08-21 MED ORDER — TECHNETIUM TC 99M TETROFOSMIN IV KIT
30.0000 | PACK | Freq: Once | INTRAVENOUS | Status: AC | PRN
  Administered 2020-08-21: 11:00:00 30 via INTRAVENOUS

## 2020-08-21 MED ORDER — SODIUM CHLORIDE FLUSH 0.9 % IV SOLN
INTRAVENOUS | Status: AC
  Administered 2020-08-21: 11:00:00 10 mL via INTRAVENOUS
  Filled 2020-08-21: qty 10

## 2020-08-21 MED ORDER — REGADENOSON 0.4 MG/5ML IV SOLN
INTRAVENOUS | Status: AC
  Administered 2020-08-21: 11:00:00 0.4 mg via INTRAVENOUS
  Filled 2020-08-21: qty 5

## 2020-08-21 MED ORDER — TECHNETIUM TC 99M TETROFOSMIN IV KIT
10.0000 | PACK | Freq: Once | INTRAVENOUS | Status: AC | PRN
  Administered 2020-08-21: 10:00:00 10.4 via INTRAVENOUS

## 2020-08-23 ENCOUNTER — Telehealth: Payer: Self-pay | Admitting: Cardiology

## 2020-08-23 NOTE — Telephone Encounter (Signed)
Patient called to see if Dr Harl Bowie got her results from Nuclear Stress Test

## 2020-08-23 NOTE — Telephone Encounter (Signed)
Test not resulted yet but was read as low risk. I told her we will call her with results after Dr.Branch reviews study.

## 2020-09-03 NOTE — Progress Notes (Signed)
Cardiology Office Note    Date:  09/09/2020   ID:  [name], DOB August 14, 1950, MRN [id number]   PCP:  Lemmie Evens, [place]  Cardiologist:  Carlyle Dolly, MD  Advanced Practice Provider:  No care team member to display Electrophysiologist:  None   [phone]   Chief Complaint  Patient presents with   Follow-up     History of Present Illness:  [name] is a 70 y.o. female with history of CAD status post NSTEMI 01/2016 treated with DES to the OM, unsuccessful PCI to the RCA with wire induced dissection managed medically, OM was felt to be the culprit.  If recurrent symptoms can consider repeat RCA intervention.  AS to the RCA 12/2019 RCA was prone to vasospasm during procedure, COPD, chronic diastolic CHF, hypertension, bradycardia resolved on lower dose Coreg, history of CVA, HLD, cirrhosis.     Maintained on Plavix monotherapy.  Patient called in chest pain 07/08/2020 and was directed to the emergency room she left without being seen..  Troponin was 3 other labs stable.  EKG without acute change.  Patient saw Dr. Harl Bowie 07/31/2020 with recurrent chest pain and several atypical features however improved with nitro.  He thought it could be related to vasospasm and changed her isosorbide to long-acting and increase to 60 mg daily.  Lexiscan 08/21/20 low risk, mild ant ischemia most likely breast attenuation, EF 80%.  Patient comes in for f/u accompanied by her caseworker. She says her symptoms are much better and not using NTG. Denies any further arm or chest tightness.      Past Medical History:  Diagnosis Date   Anxiety    Arthritis    "hands, arms, back, neck, knees, fingers" (01/21/2016)   Atrial fibrillation (HCC)    ASA daiy; came off Coumadin shortly after stroke   Breast cancer, left breast ([place]) 1970s; 2015   Cancer of skin of leg    BLE   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HCC)    left   Chronic lower back pain     Chronic pain    COPD (chronic obstructive pulmonary disease) (HCC)    Coronary artery disease    CVA (cerebral vascular accident) ([place]) pt denies right brain CVA   Right brain secondary to right internal carotid artery dissection for which 2 stents were placed;F/u cerebral angiography in 05/2008-minimal plaque; healing of previously identified left internal carotid artery dissection   Depression    GERD (gastroesophageal reflux disease)    Heart murmur    Hepatic cirrhosis ([place]) 12/20/2017   Hepatitis    History of blood transfusion    "related to spleen"   History of hiatal hernia    History of kidney stones    Hyperlipidemia    Hypertension    Hypothyroidism    Kidney stone    Melanoma of forearm, left ([place])    Migraine    "a few migraines/year" (01/21/2016)   NSTEMI (non-ST elevated myocardial infarction) ([place]) 01/21/2016   Archie Endo 01/21/2016   Obesity    Stroke ([place]) 04/2003   2 carotid artery stents in place; small left parietal and left hemispheric CVA.Phillis Knack 01/21/2016   Tobacco use disorder    50 pack years; questionably discontinued in 2010   Wears glasses     Past Surgical History:  Procedure Laterality Date   ABDOMINAL HYSTERECTOMY  1978   APPENDECTOMY     BREAST BIOPSY Left 1970s; 2015   BREAST LUMPECTOMY Left 1970s  BREAST LUMPECTOMY WITH NEEDLE LOCALIZATION Left 07/07/2013   Procedure: BREAST LUMPECTOMY WITH NEEDLE LOCALIZATION;  Surgeon: Merrie Roof, MD;  Location: [facility];  Service: General;  Laterality: Left;   CARDIAC CATHETERIZATION N/A 01/22/2016   Procedure: Left Heart Cath and Coronary Angiography;  Surgeon: Peter M Martinique, MD;  Location: [facility];  Service: Cardiovascular;  Laterality: N/A;   CARDIAC CATHETERIZATION N/A 01/22/2016   Procedure: Coronary Stent Intervention;  Surgeon: Peter M Martinique, MD;  Location: [facility];  Service: Cardiovascular;  Laterality: N/A;   CAROTID STENT Left 04/2003   small left parietal  and left hemispheric CVA/notes 07/29/2010   CHOLECYSTECTOMY OPEN     COLONOSCOPY  2011   COLONOSCOPY N/A 05/10/2019   Procedure: COLONOSCOPY;  Surgeon: Rogene Houston, MD;  [facility];  Service: Endoscopy;  Laterality: N/A;  1:45   CORONARY ATHERECTOMY N/A 12/29/2019   Procedure: CORONARY ATHERECTOMY;  Surgeon: Jettie Booze, MD;  Location: [facility];  Service: Cardiovascular;  Laterality: N/A;  Prox RCA   CORONARY STENT INTERVENTION N/A 12/29/2019   Procedure: CORONARY STENT INTERVENTION;  Surgeon: Jettie Booze, MD;  Location: [facility];  Service: Cardiovascular;  Laterality: N/A;  Prox RCA   ESOPHAGOGASTRODUODENOSCOPY  08/06/2011   Procedure: ESOPHAGOGASTRODUODENOSCOPY (EGD);  Surgeon: Rogene Houston, MD;  [facility];  Service: Endoscopy;  Laterality: N/A;   ESOPHAGOGASTRODUODENOSCOPY N/A 05/10/2019   Procedure: ESOPHAGOGASTRODUODENOSCOPY (EGD);  Surgeon: Rogene Houston, MD;  [facility];  Service: Endoscopy;  Laterality: N/A;   FLEXIBLE SIGMOIDOSCOPY  08/06/2011   Procedure: FLEXIBLE SIGMOIDOSCOPY;  Surgeon: Rogene Houston, MD;  [facility];  Service: Endoscopy;  Laterality: N/A;   INGUINAL HERNIA REPAIR Left 1970s   INTRAVASCULAR ULTRASOUND/IVUS N/A 12/29/2019   Procedure: Intravascular Ultrasound/IVUS;  Surgeon: Jettie Booze, MD;  Location: [facility];  Service: Cardiovascular;  Laterality: N/A;   LARYNX SURGERY  1970s   Polyps excised   LEFT HEART CATH AND CORONARY ANGIOGRAPHY N/A 12/29/2019   Procedure: LEFT HEART CATH AND CORONARY ANGIOGRAPHY;  Surgeon: Jettie Booze, MD;  Location: [facility];  Service: Cardiovascular;  Laterality: N/A;   MALONEY DILATION  05/10/2019   Procedure: Venia Minks DILATION;  Surgeon: Rogene Houston, MD;  [facility];  Service: Endoscopy;;   MELANOMA EXCISION Left ~ 2016   forearm   POLYPECTOMY  05/10/2019   Procedure: POLYPECTOMY;   Surgeon: Rogene Houston, MD;  [facility];  Service: Endoscopy;;  colon   SPLENECTOMY, 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   spontaneous rupture   TEMPORARY PACEMAKER N/A 12/29/2019   Procedure: TEMPORARY PACEMAKER;  Surgeon: Jettie Booze, MD;  Location: [facility];  Service: Cardiovascular;  Laterality: N/A;    Current Medications: Current Meds  Medication Sig   albuterol (PROVENTIL HFA;VENTOLIN HFA) 108 (90 Base) MCG/ACT inhaler Inhale 2 puffs into the lungs every 6 (six) hours as needed for wheezing or shortness of breath.   albuterol (PROVENTIL) (2.5 MG/3ML) 0.083% nebulizer solution Take 2.5 mg by nebulization every 6 (six) hours as needed for wheezing or shortness of breath.   amLODipine (NORVASC) 5 MG tablet Take 1 tablet (5 mg total) by mouth daily.   aspirin EC 81 MG tablet Take 1 tablet (81 mg total) by mouth daily.   aspirin-acetaminophen-caffeine (EXCEDRIN MIGRAINE) 250-250-65 MG tablet Take 1 tablet by mouth every 6 (six) hours as needed  for headache.   atorvastatin (LIPITOR) 80 MG tablet TAKE ONE TABLET BY MOUTH DAILY AT 6 P.M.   azaTHIOprine (IMURAN) 50 MG tablet TAKE 1 AND 1/2 TABLETS BY MOUTH ONCE DAILY.   carvedilol (COREG) 3.125 MG tablet TAEK 1 TABLET BY MOUTH TWICE DAILY WITH MEALS.   cetirizine (ZYRTEC) 10 MG tablet Take 10 mg by mouth daily.   clopidogrel (PLAVIX) 75 MG tablet Take 1 tablet (75 mg total) by mouth daily.   clorazepate (TRANXENE) 7.5 MG tablet Take 7.5 mg by mouth 3 (three) times daily as needed for anxiety.    famotidine (PEPCID) 20 MG tablet Take 1 tablet (20 mg total) by mouth at bedtime.   furosemide (LASIX) 40 MG tablet Take 40 mg by mouth daily as needed for fluid (leg/hand swelling.).   glipiZIDE (GLUCOTROL) 5 MG tablet Take 1 tablet (5 mg total) by mouth daily before breakfast.   HYDROcodone-acetaminophen (NORCO) 10-325 MG tablet Take 1 tablet by mouth 3 (three) times daily as needed for moderate pain.    isosorbide mononitrate (IMDUR) 60  MG 24 hr tablet Take 1 tablet (60 mg total) by mouth daily.   levothyroxine (SYNTHROID, LEVOTHROID) 50 MCG tablet Take 50 mcg by mouth daily before breakfast.    losartan (COZAAR) 50 MG tablet TAKE ONE TABLET BY MOUTH ONCE DAILY.   nitroGLYCERIN (NITROSTAT) 0.4 MG SL tablet Place 0.4 mg under the tongue every 5 (five) minutes as needed for chest pain.     ondansetron (ZOFRAN) 4 MG tablet Take 1 tablet (4 mg total) by mouth 3 (three) times daily as needed.   pantoprazole (PROTONIX) 40 MG tablet Take 1 tablet (40 mg total) by mouth daily.   senna (SENOKOT) 8.6 MG tablet Take 2 tablets by mouth at bedtime.    SPIRIVA RESPIMAT 2.5 MCG/ACT AERS Inhale 2 puffs into the lungs daily as needed (shortness of breath).    Current Facility-Administered Medications for the 09/09/20 encounter (Office Visit) with Imogene Burn, PA-C  Medication   sodium chloride flush (NS) 0.9 % injection 3 mL     Allergies:   Gabapentin, Prednisone, Tape, and Latex   Social History   Socioeconomic History   Marital status: Divorced    Spouse name: Not on file   Number of children: 2   Years of education: Not on file   Highest education level: Not on file  Occupational History   Occupation: Disabled   Occupation: retired  Tobacco Use   Smoking status: Former    Packs/day: 1.00    Years: 40.00    Pack years: 40.00    Types: Cigarettes    Quit date: 07/06/2007    Years since quitting: 13.1   Smokeless tobacco: Never  Vaping Use   Vaping Use: Never used  Substance and Sexual Activity   Alcohol use: No    Alcohol/week: 0.0 standard drinks   Drug use: No   Sexual activity: Not Currently  Other Topics Concern   Not on file  Social History Narrative   ** Merged History Encounter **       Social Determinants of Health   Financial Resource Strain: Not on file  Food Insecurity: Not on file  Transportation Needs: Not on file  Physical Activity: Not on file  Stress: Not on file  Social Connections: Not  on file     Family History:  The patient's  family history includes Arthritis in an other family member; Cancer in her father, sister, sister, and another family member;  Dementia in her sister; Diabetes in an other family member; Heart disease in an other family member; Heart failure in her brother, father, and mother; Kidney disease in an other family member.   ROS:   Please see the history of present illness.    ROS All other systems reviewed and are negative.   PHYSICAL EXAM:   VS:  BP 140/76   Pulse 75   Ht 5\' 3"  (1.6 m)   Wt 202 lb (91.6 kg)   SpO2 95%   BMI 35.78 kg/m   Physical Exam  GEN: Obese, in no acute distress  Neck: no JVD, carotid bruits, or masses Cardiac:RRR; no murmurs, rubs, or gallops  Respiratory:  clear to auscultation bilaterally, normal work of breathing GI: soft, nontender, nondistended, + BS Ext: without cyanosis, clubbing, or edema, Good distal pulses bilaterally Neuro:  Alert and Oriented x 3,  Psych: euthymic mood, full affect  Wt Readings from Last 3 Encounters:  09/09/20 202 lb (91.6 kg)  07/31/20 203 lb (92.1 kg)  04/11/20 209 lb (94.8 kg)      Studies/Labs Reviewed:   EKG:  EKG is not ordered today.     Recent Labs: 04/16/2020: ALT 17 07/08/2020: BUN 9; Creatinine, Ser 0.58; Hemoglobin 14.9; Platelets 400; Potassium 3.5; Sodium 136   Lipid Panel    Component Value Date/Time   CHOL 128 04/08/2020 1045   TRIG 115 04/08/2020 1045   HDL 38 (L) 04/08/2020 1045   CHOLHDL 3.4 04/08/2020 1045   CHOLHDL 3.4 03/27/2016 1134   VLDL 20 03/27/2016 1134   LDLCALC 69 04/08/2020 1045    Additional studies/ records that were reviewed today include:   Lexiscan 08/21/20 Lexiscan stress is electrically negative for ischemia Myoview scan shows mild thinning of anterior wall (mid/distal) with decreased tracer activity consistent with mild anterior ischemia; Cannot exclude however that changes are due to shifting breast attenuation. LVEF calculated at  80% This is a low risk study.  Diagnostic Studies 01/2016 cath The left ventricular systolic function is normal. LV end diastolic pressure is normal. The left ventricular ejection fraction is 55-65% by visual estimate. Prox RCA lesion, 90 %stenosed. 1st Diag lesion, 60 %stenosed. Prox LAD to Mid LAD lesion, 20 %stenosed. 2nd Diag lesion, 40 %stenosed. Ost Ramus to Ramus lesion, 50 %stenosed. 1st Mrg lesion, 99 %stenosed. A STENT SYNERGY DES 2.75X20 drug eluting stent was successfully placed. Post intervention, there is a 0% residual stenosis. Ost RCA to Prox RCA lesion, 90 %stenosed. Post intervention, there is a 90% residual stenosis with dissection from the proximal to mid RCA.   1. Severe 2 vessel obstructive CAD 2. Normal LV function and LV EDP 3. Successful stenting of the first OM with DES. This appears to be the culprit vessel. 4. Unsuccessful PCI of the RCA due to wire induced dissection of the vessel.   Plan: DAPT for one year. Aggressive BP control. Will continue IV Ntg overnight. Despite RCA dissection patient is pain free, hemodynamically stable and has a normal Ecg. If her symptoms remain stable I would treat medically and allow the RCA to heal. If she continues to have angina attempt at PCI of the RCA could be considered once the vessel has had time to heal - 6-8 weeks.     01/2016 echo Study Conclusions   - Left ventricle: The cavity size was normal. Systolic function was   vigorous. The estimated ejection fraction was in the range of 65%   to 70%. Wall motion was normal; there  were no regional wall   motion abnormalities. There was an increased relative   contribution of atrial contraction to ventricular filling.   Doppler parameters are consistent with abnormal left ventricular   relaxation (grade 1 diastolic dysfunction). Doppler parameters   are consistent with high ventricular filling pressure. - Tricuspid valve: There was trivial regurgitation. - Pulmonary  arteries: PA peak pressure: 32 mm Hg (S). - Pericardium, extracardiac: A trivial, free-flowing pericardial   effusion was identified along the right ventricular free wall.   The fluid had no internal echoes.     12/2019 cath Previously placed 1st Mrg drug eluting stent is widely patent. Ost RCA to Prox RCA lesion is 90% stenosed. After orbital atherectomy drug-eluting stent was successfully placed using a STENT RESOLUTE ONYX 3.0X26, and postdilated to 3.75 mm. Stent was optimized with IVUS. Post intervention, there is a 0% residual stenosis. The left ventricular systolic function is normal. LV end diastolic pressure is normal. The left ventricular ejection fraction is 55-65% by visual estimate. There is no aortic valve stenosis. RCA was prone to vasospasm during the PCI.   Continue dual antiplatelet therapy for at least 6 months.  Consider clopidogrel monotherapy long-term given some diffuse disease in her LAD system.  Continue with attempts at aggressive secondary prevention.   Plan for same day discharge.       Risk Assessment/Calculations:         ASSESSMENT:    1. Coronary artery disease involving native coronary artery of native heart without angina pectoris   2. Chronic diastolic heart failure ([place])   3. Essential hypertension   4. Bradycardia   5. History of CVA (cerebrovascular accident)   6. Hyperlipidemia, unspecified hyperlipidemia type      PLAN:  In order of problems listed above:  CAD S/P NSTEMI DES OM, unsuccessful PCI RCA with wire induced dissection managed medically, DES RCA 12/2019 prone to vasospasm, recommend long term Plavix. Lexiscan 08/21/20 low risk. No further left arm or chest pain since Imdru increased  Chronic diastolic CHF compensated  HTN BP borderline but has a headache-will keep a check at home  Bradycardia improved on lower dose coreg  History of CVA  HLD LDL 69 on atorvastatin   Shared Decision Making/Informed Consent         Medication Adjustments/Labs and Tests Ordered: Current medicines are reviewed at length with the patient today.  Concerns regarding medicines are outlined above.  Medication changes, Labs and Tests ordered today are listed in the Patient Instructions below. Patient Instructions  Medication Instructions:  Your physician recommends that you continue on your current medications as directed. Please refer to the Current Medication list given to you today.  *If you need a refill on your cardiac medications before your next appointment, please call your pharmacy*   Lab Work: None If you have labs (blood work) drawn today and your tests are completely normal, you will receive your results only by: [place] (if you have MyChart) OR A paper copy in the mail If you have any lab test that is abnormal or we need to change your treatment, we will call you to review the results.   Testing/Procedures: None   Follow-Up: At [facility], you and your health needs are our priority.  As part of our continuing mission to provide you with exceptional heart care, we have created designated Provider Care Teams.  These Care Teams include your primary Cardiologist (physician) and Advanced Practice Providers (APPs -  Physician Assistants and Nurse  Practitioners) who all work together to provide you with the care you need, when you need it.  We recommend signing up for the patient portal called "MyChart".  Sign up information is provided on this After Visit Summary.  MyChart is used to connect with patients for Virtual Visits (Telemedicine).  Patients are able to view lab/test results, encounter notes, upcoming appointments, etc.  Non-urgent messages can be sent to your provider as well.   To learn more about what you can do with MyChart, go to [email or web address].    Your next appointment:   6 month(s)  The format for your next appointment:   In Person  Provider:   Carlyle Dolly,  MD   Other Instructions     Signed, Ermalinda Barrios, PA-C  09/09/2020 2:01 PM    [facility] [address], [place], [place]  [address] Phone: [phone]; Fax: [phone]

## 2020-09-09 ENCOUNTER — Other Ambulatory Visit: Payer: Self-pay

## 2020-09-09 ENCOUNTER — Ambulatory Visit (INDEPENDENT_AMBULATORY_CARE_PROVIDER_SITE_OTHER): Payer: Medicare Other | Admitting: Physician Assistant

## 2020-09-09 ENCOUNTER — Encounter: Payer: Self-pay | Admitting: Physician Assistant

## 2020-09-09 VITALS — BP 140/76 | HR 75 | Ht 63.0 in | Wt 202.0 lb

## 2020-09-09 DIAGNOSIS — Z8673 Personal history of transient ischemic attack (TIA), and cerebral infarction without residual deficits: Secondary | ICD-10-CM

## 2020-09-09 DIAGNOSIS — E785 Hyperlipidemia, unspecified: Secondary | ICD-10-CM

## 2020-09-09 DIAGNOSIS — I10 Essential (primary) hypertension: Secondary | ICD-10-CM

## 2020-09-09 DIAGNOSIS — R001 Bradycardia, unspecified: Secondary | ICD-10-CM

## 2020-09-09 DIAGNOSIS — I2510 Atherosclerotic heart disease of native coronary artery without angina pectoris: Secondary | ICD-10-CM

## 2020-09-09 DIAGNOSIS — I5032 Chronic diastolic (congestive) heart failure: Secondary | ICD-10-CM

## 2020-09-09 NOTE — Patient Instructions (Signed)

## 2020-09-10 ENCOUNTER — Other Ambulatory Visit (HOSPITAL_COMMUNITY): Payer: Self-pay | Admitting: Interventional Radiology

## 2020-09-10 DIAGNOSIS — Z8673 Personal history of transient ischemic attack (TIA), and cerebral infarction without residual deficits: Secondary | ICD-10-CM

## 2020-09-10 DIAGNOSIS — G459 Transient cerebral ischemic attack, unspecified: Secondary | ICD-10-CM

## 2020-09-20 ENCOUNTER — Ambulatory Visit (HOSPITAL_COMMUNITY)
Admission: RE | Admit: 2020-09-20 | Discharge: 2020-09-20 | Disposition: A | Discharge status: Home / Self Care | Payer: Medicare Other | Source: Ambulatory Visit | Attending: Interventional Radiology | Admitting: Interventional Radiology

## 2020-09-20 ENCOUNTER — Other Ambulatory Visit: Payer: Self-pay

## 2020-09-20 DIAGNOSIS — G459 Transient cerebral ischemic attack, unspecified: Secondary | ICD-10-CM

## 2020-09-20 DIAGNOSIS — Z8673 Personal history of transient ischemic attack (TIA), and cerebral infarction without residual deficits: Secondary | ICD-10-CM

## 2020-09-23 ENCOUNTER — Other Ambulatory Visit (HOSPITAL_COMMUNITY): Payer: Self-pay | Admitting: Internal Medicine

## 2020-09-23 HISTORY — PX: IR RADIOLOGIST EVAL & MGMT: IMG5224

## 2020-09-24 ENCOUNTER — Other Ambulatory Visit (HOSPITAL_COMMUNITY): Payer: Self-pay | Admitting: Interventional Radiology

## 2020-09-24 DIAGNOSIS — G459 Transient cerebral ischemic attack, unspecified: Secondary | ICD-10-CM

## 2020-09-24 DIAGNOSIS — H93A9 Pulsatile tinnitus, unspecified ear: Secondary | ICD-10-CM

## 2020-09-24 DIAGNOSIS — Z8673 Personal history of transient ischemic attack (TIA), and cerebral infarction without residual deficits: Secondary | ICD-10-CM

## 2020-10-08 ENCOUNTER — Encounter (INDEPENDENT_AMBULATORY_CARE_PROVIDER_SITE_OTHER): Payer: Self-pay | Admitting: *Deleted

## 2020-10-15 ENCOUNTER — Ambulatory Visit (INDEPENDENT_AMBULATORY_CARE_PROVIDER_SITE_OTHER): Payer: Medicare Other | Admitting: Internal Medicine

## 2020-10-17 ENCOUNTER — Other Ambulatory Visit: Payer: Self-pay

## 2020-10-17 ENCOUNTER — Ambulatory Visit (HOSPITAL_COMMUNITY)
Admission: RE | Admit: 2020-10-17 | Discharge: 2020-10-17 | Disposition: A | Discharge status: Home / Self Care | Payer: Medicare Other | Source: Ambulatory Visit | Attending: Interventional Radiology | Admitting: Interventional Radiology

## 2020-10-17 DIAGNOSIS — G459 Transient cerebral ischemic attack, unspecified: Secondary | ICD-10-CM | POA: Diagnosis present

## 2020-10-17 DIAGNOSIS — H93A9 Pulsatile tinnitus, unspecified ear: Secondary | ICD-10-CM | POA: Insufficient documentation

## 2020-10-17 DIAGNOSIS — Z8673 Personal history of transient ischemic attack (TIA), and cerebral infarction without residual deficits: Secondary | ICD-10-CM | POA: Insufficient documentation

## 2020-10-21 ENCOUNTER — Encounter (INDEPENDENT_AMBULATORY_CARE_PROVIDER_SITE_OTHER): Payer: Self-pay | Admitting: Gastroenterology

## 2020-10-21 ENCOUNTER — Other Ambulatory Visit: Payer: Self-pay

## 2020-10-21 ENCOUNTER — Ambulatory Visit (INDEPENDENT_AMBULATORY_CARE_PROVIDER_SITE_OTHER): Payer: Medicare Other | Admitting: Gastroenterology

## 2020-10-21 VITALS — BP 109/68 | HR 91 | Ht 63.0 in | Wt 204.6 lb

## 2020-10-21 DIAGNOSIS — R1319 Other dysphagia: Secondary | ICD-10-CM | POA: Diagnosis not present

## 2020-10-21 DIAGNOSIS — K743 Primary biliary cirrhosis: Secondary | ICD-10-CM | POA: Insufficient documentation

## 2020-10-21 DIAGNOSIS — K754 Autoimmune hepatitis: Secondary | ICD-10-CM

## 2020-10-21 DIAGNOSIS — K838 Other specified diseases of biliary tract: Secondary | ICD-10-CM | POA: Insufficient documentation

## 2020-10-21 MED ORDER — AZATHIOPRINE 50 MG PO TABS: 75.0000 mg | ORAL_TABLET | Freq: Every day | ORAL | 5 refills | Status: DC

## 2020-10-21 MED ORDER — LORAZEPAM 0.5 MG PO TABS: 0.5000 mg | ORAL_TABLET | Freq: Three times a day (TID) | ORAL | 0 refills | Status: DC

## 2020-10-21 NOTE — Progress Notes (Signed)
[name], M.D. Gastroenterology & Hepatology [facility] [address] [place], [place] [address]  Primary Care Physician: Lemmie Evens, MD [place] [address]  I will communicate my assessment and recommendations to the referring MD via EMR.  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  The patient was last seen on 04/11/2020. At that time, the patient had CBC, IgG, LFTs and AFP checked.  IgG was normal 1159, AFP was neg 4.1, CBC was within normal limits with mildly increased platelet count of 482, CMP showed normal LFTs with AST of 24 and ALT of 17, alkaline phosphatase was 98, total bilirubin was 0.5.A liver ultrasound was performed which showed persistently dilated CBD of 18 mm similar to previous size but no other alterations.  She was referred to speech and swallow evaluation for her dysphagia but she did not make this appointment.  Patient reports she has felt fine.  Feels that after she started taking medication for her autoimmune hepatitis her symptoms have much more improved.  She is currently on AZA 75 mg qday. Has tolerated the medication adequately, has taken it compliantly for the last 2 years after her histologic diagnosis. She reports having some morning sickness in the mornings, and vomits 2 times per day when this happens but is not on a daily basis.  States that  when she takes Zofran her symptoms completely resolve.   She also reports having some bloating but does not know exactly the type of food that causes the bloating. She follows a diet that eliminates fried food. She eats a lot of vegetables and tries a "lean diet"  The patient denies having any nausea, vomiting, fever, chills, hematochezia, melena, hematemesis, abdominal pain, diarrhea, jaundice, pruritus. Has lost 3-4 lb in last 6 months.  Last EGD: 05/10/2019, no presence of alterations explain dysphagia, he was empirically dilated with a Maloney up to 19 [place] without presence of any mucosal disruption, normal stomach and duodenum. Last Colonoscopy:05/10/2019, 7 polyps were found in the descending, transverse and ascending colon, which were completely removed, diverticulosis was found in the colon, external hemorrhoids were found.  Pathology showed multiple tubular adenomas.  Past Medical History: Past Medical History:  Diagnosis Date   Anxiety    Arthritis    "hands, arms, back, neck, knees, fingers" (01/21/2016)   Atrial fibrillation (HCC)    ASA daiy; came off Coumadin shortly after stroke   Breast cancer, left breast ([place]) 1970s; 2015   Cancer of skin of leg    BLE   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HCC)    left   Chronic lower back pain    Chronic pain    COPD (chronic obstructive pulmonary disease) (HCC)    Coronary artery disease    CVA (cerebral vascular accident) ([place]) pt denies right brain CVA   Right brain secondary to right internal carotid artery dissection for which 2 stents were placed;F/u cerebral angiography in 05/2008-minimal plaque; healing of previously identified left internal carotid artery dissection   Depression    GERD (gastroesophageal reflux  disease)    Heart murmur    Hepatic cirrhosis (HCC) 12/20/2017   Hepatitis    History of blood transfusion    "related to spleen"   History of hiatal hernia    History of kidney stones    Hyperlipidemia    Hypertension     Hypothyroidism    Kidney stone    Melanoma of forearm, left ([place])    Migraine    "a few migraines/year" (01/21/2016)   NSTEMI (non-ST elevated myocardial infarction) ([place]) 01/21/2016   Archie Endo 01/21/2016   Obesity    Stroke ([place]) 04/2003   2 carotid artery stents in place; small left parietal and left hemispheric CVA.Phillis Knack 01/21/2016   Tobacco use disorder    50 pack years; questionably discontinued in 2010   Wears glasses     Past Surgical History: Past Surgical History:  Procedure Laterality Date   ABDOMINAL HYSTERECTOMY  1978   APPENDECTOMY     BREAST BIOPSY Left 1970s; 2015   BREAST LUMPECTOMY Left 1970s   BREAST LUMPECTOMY WITH NEEDLE LOCALIZATION Left 07/07/2013   Procedure: BREAST LUMPECTOMY WITH NEEDLE LOCALIZATION;  Surgeon: Merrie Roof, MD;  Location: [place];  Service: General;  Laterality: Left;   CARDIAC CATHETERIZATION N/A 01/22/2016   Procedure: Left Heart Cath and Coronary Angiography;  Surgeon: Peter M Martinique, MD;  Location: [place] [facility];  Service: Cardiovascular;  Laterality: N/A;   CARDIAC CATHETERIZATION N/A 01/22/2016   Procedure: Coronary Stent Intervention;  Surgeon: Peter M Martinique, MD;  Location: [place] [facility];  Service: Cardiovascular;  Laterality: N/A;   CAROTID STENT Left 04/2003   small left parietal and left hemispheric CVA/notes 07/29/2010   CHOLECYSTECTOMY OPEN     COLONOSCOPY  2011   COLONOSCOPY N/A 05/10/2019   Procedure: COLONOSCOPY;  Surgeon: Rogene Houston, MD;  [facility];  Service: Endoscopy;  Laterality: N/A;  1:45   CORONARY ATHERECTOMY N/A 12/29/2019   Procedure: CORONARY ATHERECTOMY;  Surgeon: Jettie Booze, MD;  Location: [place] [facility];  Service: Cardiovascular;  Laterality: N/A;  Prox RCA   CORONARY STENT INTERVENTION N/A 12/29/2019   Procedure: CORONARY STENT INTERVENTION;  Surgeon: Jettie Booze, MD;  Location: [place] [facility];  Service: Cardiovascular;  Laterality: N/A;   Prox RCA   ESOPHAGOGASTRODUODENOSCOPY  08/06/2011   Procedure: ESOPHAGOGASTRODUODENOSCOPY (EGD);  Surgeon: Rogene Houston, MD;  [facility];  Service: Endoscopy;  Laterality: N/A;   ESOPHAGOGASTRODUODENOSCOPY N/A 05/10/2019   Procedure: ESOPHAGOGASTRODUODENOSCOPY (EGD);  Surgeon: Rogene Houston, MD;  [facility];  Service: Endoscopy;  Laterality: N/A;   FLEXIBLE SIGMOIDOSCOPY  08/06/2011   Procedure: FLEXIBLE SIGMOIDOSCOPY;  Surgeon: Rogene Houston, MD;  [facility];  Service: Endoscopy;  Laterality: N/A;   INGUINAL HERNIA REPAIR Left 1970s   INTRAVASCULAR ULTRASOUND/IVUS N/A 12/29/2019   Procedure: Intravascular Ultrasound/IVUS;  Surgeon: Jettie Booze, MD;  Location: [place] [facility];  Service: Cardiovascular;  Laterality: N/A;   IR RADIOLOGIST EVAL & MGMT  09/23/2020   LARYNX SURGERY  1970s   Polyps excised   LEFT HEART CATH AND CORONARY ANGIOGRAPHY N/A 12/29/2019   Procedure: LEFT HEART CATH AND CORONARY ANGIOGRAPHY;  Surgeon: Jettie Booze, MD;  Location: [place] [facility];  Service: Cardiovascular;  Laterality: N/A;   MALONEY DILATION  05/10/2019   Procedure: MALONEY DILATION;  Surgeon: Rogene Houston, MD;  [facility];  Service: Endoscopy;;   MELANOMA EXCISION Left ~  2016   forearm   POLYPECTOMY  05/10/2019   Procedure: POLYPECTOMY;  Surgeon: Rogene Houston, MD;  [facility];  Service: Endoscopy;;  colon   SPLENECTOMY, 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   spontaneous rupture   TEMPORARY PACEMAKER N/A 12/29/2019   Procedure: TEMPORARY PACEMAKER;  Surgeon: Jettie Booze, MD;  Location: [place] [facility];  Service: Cardiovascular;  Laterality: N/A;    Family History: Family History  Problem Relation Age of Onset   Heart failure Mother    Cancer Father    Heart failure Father    Cancer Sister    Dementia Sister    Heart disease Other    Arthritis Other    Cancer Other    Diabetes Other    Kidney disease Other     Cancer Sister    Heart failure Brother     Social History: Social History   Tobacco Use  Smoking Status Former   Packs/day: 1.00   Years: 40.00   Pack years: 40.00   Types: Cigarettes   Quit date: 07/06/2007   Years since quitting: 13.3  Smokeless Tobacco Never   Social History   Substance and Sexual Activity  Alcohol Use No   Alcohol/week: 0.0 standard drinks   Social History   Substance and Sexual Activity  Drug Use No    Allergies: Allergies  Allergen Reactions   Gabapentin Nausea And Vomiting   Prednisone Palpitations   Tape Other (See Comments)    Causes blisters to form   Latex Rash and Other (See Comments)    Blisters    Medications: Current Outpatient Medications  Medication Sig Dispense Refill   albuterol (PROVENTIL HFA;VENTOLIN HFA) 108 (90 Base) MCG/ACT inhaler Inhale 2 puffs into the lungs every 6 (six) hours as needed for wheezing or shortness of breath.     albuterol (PROVENTIL) (2.5 MG/3ML) 0.083% nebulizer solution Take 2.5 mg by nebulization every 6 (six) hours as needed for wheezing or shortness of breath.     amLODipine (NORVASC) 5 MG tablet Take 1 tablet (5 mg total) by mouth daily. 90 tablet 3   aspirin EC 81 MG tablet Take 1 tablet (81 mg total) by mouth daily.     aspirin-acetaminophen-caffeine (EXCEDRIN MIGRAINE) 250-250-65 MG tablet Take 1 tablet by mouth every 6 (six) hours as needed for headache.     atorvastatin (LIPITOR) 80 MG tablet TAKE ONE TABLET BY MOUTH DAILY AT 6 P.M. 90 tablet 0   azaTHIOprine (IMURAN) 50 MG tablet TAKE 1 AND 1/2 TABLETS BY MOUTH ONCE DAILY. 45 tablet 5   carvedilol (COREG) 3.125 MG tablet TAEK 1 TABLET BY MOUTH TWICE DAILY WITH MEALS. 180 tablet 0   cetirizine (ZYRTEC) 10 MG tablet Take 10 mg by mouth daily.     clopidogrel (PLAVIX) 75 MG tablet Take 1 tablet (75 mg total) by mouth daily. 30 tablet 11   clorazepate (TRANXENE) 7.5 MG tablet Take 7.5 mg by mouth 3 (three) times daily as needed for anxiety.       famotidine (PEPCID) 20 MG tablet Take 1 tablet (20 mg total) by mouth at bedtime. 30 tablet 11   furosemide (LASIX) 40 MG tablet Take 40 mg by mouth daily as needed for fluid (leg/hand swelling.).     glipiZIDE (GLUCOTROL) 5 MG tablet Take 1 tablet (5 mg total) by mouth daily before breakfast. 30 tablet 3   HYDROcodone-acetaminophen (NORCO) 10-325 MG tablet Take 1 tablet by mouth 3 (three) times daily as needed for moderate pain.  isosorbide mononitrate (IMDUR) 60 MG 24 hr tablet Take 1 tablet (60 mg total) by mouth daily. 90 tablet 3   levothyroxine (SYNTHROID, LEVOTHROID) 50 MCG tablet Take 50 mcg by mouth daily before breakfast.      losartan (COZAAR) 50 MG tablet TAKE ONE TABLET BY MOUTH ONCE DAILY. 90 tablet 3   nitroGLYCERIN (NITROSTAT) 0.4 MG SL tablet Place 0.4 mg under the tongue every 5 (five) minutes as needed for chest pain.       ondansetron (ZOFRAN) 4 MG tablet Take 1 tablet (4 mg total) by mouth 3 (three) times daily as needed. 30 tablet 1   pantoprazole (PROTONIX) 40 MG tablet Take 1 tablet (40 mg total) by mouth daily. 30 tablet 11   senna (SENOKOT) 8.6 MG tablet Take 2 tablets by mouth at bedtime.      SPIRIVA RESPIMAT 2.5 MCG/ACT AERS Inhale 2 puffs into the lungs daily as needed (shortness of breath).      Current Facility-Administered Medications  Medication Dose Route Frequency Provider Last Rate Last Admin   sodium chloride flush (NS) 0.9 % injection 3 mL  3 mL Intravenous Q12H Branch, Alphonse Guild, MD        Review of Systems: GENERAL: negative for malaise, night sweats HEENT: No changes in hearing or vision, no nose bleeds or other nasal problems. NECK: Negative for lumps, goiter, pain and significant neck swelling RESPIRATORY: Negative for cough, wheezing CARDIOVASCULAR: Negative for chest pain, leg swelling, palpitations, orthopnea GI: SEE HPI MUSCULOSKELETAL: Negative for joint pain or swelling, back pain, and muscle pain. SKIN: Negative for lesions,  rash PSYCH: Negative for sleep disturbance, mood disorder and recent psychosocial stressors. HEMATOLOGY Negative for prolonged bleeding, bruising easily, and swollen nodes. ENDOCRINE: Negative for cold or heat intolerance, polyuria, polydipsia and goiter. NEURO: negative for tremor, gait imbalance, syncope and seizures. The remainder of the review of systems is noncontributory.   Physical Exam: BP 109/68 (BP Location: Left Arm, Patient Position: Sitting, Cuff Size: Large)   Pulse 91   Ht '5\' 3"'$  (1.6 m)   Wt 204 lb 9.6 oz (92.8 kg)   BMI 36.24 kg/m  GENERAL: The patient is AO x3, in no acute distress. Obese. HEENT: Head is normocephalic and atraumatic. EOMI are intact. Mouth is well hydrated and without lesions. NECK: Supple. No masses LUNGS: Clear to auscultation. No presence of rhonchi/wheezing/rales. Adequate chest expansion HEART: RRR, normal s1 and s2. ABDOMEN: Soft, nontender, no guarding, no peritoneal signs, and nondistended. BS +. No masses. EXTREMITIES: Without any cyanosis, clubbing, rash, lesions or edema. NEUROLOGIC: AOx3, no focal motor deficit. SKIN: no jaundice, no rashes  Imaging/Labs: as above  I personally reviewed and interpreted the available labs, imaging and endoscopic files.  Impression and Plan: [name] is a 70 y.o. 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  In terms of her abdominal hepatitis, she has presented normalization of her aminotransferases with her current dose of azathioprine achieving biochemical remission.  I would like to see if this persist for the next 6 months and will consider decreasing the dosage by 25 mg with close  monitoring of her LFTs after doing that in her next appointment.  For now, we will continue her on Imuran 75 mg every day.  She has evidence of  some bridging fibrosis, for which I would like to evaluate if she presented any progression to nodular liver cirrhosis with an elastography at this moment.  Will check MELD labs and IgG today as well.  Upon review of her previous abdominal imaging, she has evidence of CBD dilation to a significant size.  This could be related to her cholecystectomy status but other etiologies will need to be evaluated with an MRCP (will need sedation with Ativan given her history of anxiety).  Particularly, she had evidence of infiltrates in her bile ducts in the past which was concerning for possible PBC although her alkaline phosphatase and total bilirubin have been normal all this time which is infrequent for this condition (in fact, she will not need any active treatment if she indeed has PBC).  Nevertheless, some of these changes can also be seen in [facility], of which will be further clarified with the MRCP.  Finally, she has presented some episodes of dysphagia which have been evaluated with an EGD without improvement after she underwent an empiric dilation.  We will reorder a speech and swallow evaluation to evaluate the symptoms further.  I inquired about the possibility of doing an esophageal manometry but she is not interested in this at the moment as her symptoms are infrequent.  -Schedule liver elastography -Continue azathioprine 75 mg every day - Schedule modified barium swallow - Schedule MRCP - Continue Zofran as needed for nausea - Check MELD labs and IgG today - May consider decreasing to AZA 50 mg in future if remission is sustained in follow up appointment - RTC 6 months.  All questions were answered.      Harvel Quale, MD Gastroenterology and Hepatology [facility]

## 2020-10-21 NOTE — Patient Instructions (Signed)
Schedule liver elastography Continue azathioprine 1.5 tablets every day Schedule modified barium swallow Schedule MRCP Continue Zofran as needed for nausea Perform blood workup

## 2020-10-22 ENCOUNTER — Telehealth (HOSPITAL_COMMUNITY): Payer: Self-pay

## 2020-10-22 NOTE — Telephone Encounter (Signed)
[place] from [facility]: Hello, Dr. Keturah Barre would like a CTA of head and neck now if she is symptomatic. If not currently symptomatic then in 6 months. Thank you.

## 2020-10-23 LAB — CBC WITH DIFFERENTIAL/PLATELET
Absolute Monocytes: 977 cells/uL — ABNORMAL HIGH (ref 200–950)
Basophils Absolute: 84 cells/uL (ref 0–200)
Basophils Relative: 0.9 %
Eosinophils Absolute: 177 cells/uL (ref 15–500)
Eosinophils Relative: 1.9 %
HCT: 43.9 % (ref 35.0–45.0)
Hemoglobin: 14.6 g/dL (ref 11.7–15.5)
Lymphs Abs: 2809 cells/uL (ref 850–3900)
MCH: 29.4 pg (ref 27.0–33.0)
MCHC: 33.3 g/dL (ref 32.0–36.0)
MCV: 88.5 fL (ref 80.0–100.0)
MPV: 10.1 fL (ref 7.5–12.5)
Monocytes Relative: 10.5 %
Neutro Abs: 5255 cells/uL (ref 1500–7800)
Neutrophils Relative %: 56.5 %
Platelets: 402 10*3/uL — ABNORMAL HIGH (ref 140–400)
RBC: 4.96 10*6/uL (ref 3.80–5.10)
RDW: 12.8 % (ref 11.0–15.0)
Total Lymphocyte: 30.2 %
WBC: 9.3 10*3/uL (ref 3.8–10.8)

## 2020-10-23 LAB — COMPREHENSIVE METABOLIC PANEL
AG Ratio: 1.6 (calc) (ref 1.0–2.5)
ALT: 16 U/L (ref 6–29)
AST: 21 U/L (ref 10–35)
Albumin: 4.5 g/dL (ref 3.6–5.1)
Alkaline phosphatase (APISO): 100 U/L (ref 37–153)
BUN: 11 mg/dL (ref 7–25)
CO2: 32 mmol/L (ref 20–32)
Calcium: 9.8 mg/dL (ref 8.6–10.4)
Chloride: 101 mmol/L (ref 98–110)
Creat: 0.71 mg/dL (ref 0.60–1.00)
Globulin: 2.8 g/dL (calc) (ref 1.9–3.7)
Glucose, Bld: 150 mg/dL — ABNORMAL HIGH (ref 65–99)
Potassium: 4.1 mmol/L (ref 3.5–5.3)
Sodium: 140 mmol/L (ref 135–146)
Total Bilirubin: 0.5 mg/dL (ref 0.2–1.2)
Total Protein: 7.3 g/dL (ref 6.1–8.1)

## 2020-10-23 LAB — IGG: IgG (Immunoglobin G), Serum: 1066 mg/dL (ref 600–1540)

## 2020-10-23 LAB — PROTIME-INR
INR: 1
Prothrombin Time: 10.3 s (ref 9.0–11.5)

## 2020-10-28 ENCOUNTER — Other Ambulatory Visit (INDEPENDENT_AMBULATORY_CARE_PROVIDER_SITE_OTHER): Payer: Self-pay

## 2020-10-28 DIAGNOSIS — K838 Other specified diseases of biliary tract: Secondary | ICD-10-CM

## 2020-10-30 ENCOUNTER — Other Ambulatory Visit: Payer: Self-pay | Admitting: Radiology

## 2020-10-30 ENCOUNTER — Other Ambulatory Visit (HOSPITAL_COMMUNITY): Payer: Self-pay | Admitting: Specialist

## 2020-10-30 DIAGNOSIS — R1312 Dysphagia, oropharyngeal phase: Secondary | ICD-10-CM

## 2020-11-01 ENCOUNTER — Other Ambulatory Visit: Payer: Self-pay

## 2020-11-01 ENCOUNTER — Ambulatory Visit (HOSPITAL_COMMUNITY)
Admission: RE | Admit: 2020-11-01 | Discharge: 2020-11-01 | Disposition: A | Discharge status: Home / Self Care | Payer: Medicare Other | Source: Ambulatory Visit | Attending: Interventional Radiology | Admitting: Interventional Radiology

## 2020-11-01 ENCOUNTER — Other Ambulatory Visit (HOSPITAL_COMMUNITY): Payer: Self-pay | Admitting: Interventional Radiology

## 2020-11-01 DIAGNOSIS — Z79899 Other long term (current) drug therapy: Secondary | ICD-10-CM | POA: Insufficient documentation

## 2020-11-01 DIAGNOSIS — Z87891 Personal history of nicotine dependence: Secondary | ICD-10-CM | POA: Diagnosis not present

## 2020-11-01 DIAGNOSIS — Z79890 Hormone replacement therapy (postmenopausal): Secondary | ICD-10-CM | POA: Insufficient documentation

## 2020-11-01 DIAGNOSIS — Z955 Presence of coronary angioplasty implant and graft: Secondary | ICD-10-CM | POA: Insufficient documentation

## 2020-11-01 DIAGNOSIS — Z7902 Long term (current) use of antithrombotics/antiplatelets: Secondary | ICD-10-CM | POA: Diagnosis not present

## 2020-11-01 DIAGNOSIS — Z8673 Personal history of transient ischemic attack (TIA), and cerebral infarction without residual deficits: Secondary | ICD-10-CM

## 2020-11-01 DIAGNOSIS — I2510 Atherosclerotic heart disease of native coronary artery without angina pectoris: Secondary | ICD-10-CM | POA: Insufficient documentation

## 2020-11-01 DIAGNOSIS — Z888 Allergy status to other drugs, medicaments and biological substances status: Secondary | ICD-10-CM | POA: Insufficient documentation

## 2020-11-01 DIAGNOSIS — Z7984 Long term (current) use of oral hypoglycemic drugs: Secondary | ICD-10-CM | POA: Insufficient documentation

## 2020-11-01 DIAGNOSIS — G459 Transient cerebral ischemic attack, unspecified: Secondary | ICD-10-CM

## 2020-11-01 DIAGNOSIS — H93A2 Pulsatile tinnitus, left ear: Secondary | ICD-10-CM | POA: Diagnosis present

## 2020-11-01 DIAGNOSIS — Z91040 Latex allergy status: Secondary | ICD-10-CM | POA: Diagnosis not present

## 2020-11-01 DIAGNOSIS — H93A9 Pulsatile tinnitus, unspecified ear: Secondary | ICD-10-CM

## 2020-11-01 DIAGNOSIS — Z7982 Long term (current) use of aspirin: Secondary | ICD-10-CM | POA: Insufficient documentation

## 2020-11-01 HISTORY — PX: IR US GUIDE VASC ACCESS RIGHT: IMG2390

## 2020-11-01 HISTORY — PX: IR ANGIO INTRA EXTRACRAN SEL COM CAROTID INNOMINATE BILAT MOD SED: IMG5360

## 2020-11-01 HISTORY — PX: IR ANGIO VERTEBRAL SEL VERTEBRAL BILAT MOD SED: IMG5369

## 2020-11-01 LAB — CBC
HCT: 42.5 % (ref 36.0–46.0)
Hemoglobin: 14.4 g/dL (ref 12.0–15.0)
MCH: 30.6 pg (ref 26.0–34.0)
MCHC: 33.9 g/dL (ref 30.0–36.0)
MCV: 90.2 fL (ref 80.0–100.0)
Platelets: 426 10*3/uL — ABNORMAL HIGH (ref 150–400)
RBC: 4.71 MIL/uL (ref 3.87–5.11)
RDW: 13.3 % (ref 11.5–15.5)
WBC: 11.3 10*3/uL — ABNORMAL HIGH (ref 4.0–10.5)
nRBC: 0 % (ref 0.0–0.2)

## 2020-11-01 LAB — BASIC METABOLIC PANEL
Anion gap: 7 (ref 5–15)
BUN: 17 mg/dL (ref 8–23)
CO2: 30 mmol/L (ref 22–32)
Calcium: 9.4 mg/dL (ref 8.9–10.3)
Chloride: 103 mmol/L (ref 98–111)
Creatinine, Ser: 0.64 mg/dL (ref 0.44–1.00)
GFR, Estimated: >60 mL/min (ref >60)
Glucose, Bld: 122 mg/dL — ABNORMAL HIGH (ref 70–99)
Potassium: 4.1 mmol/L (ref 3.5–5.1)
Sodium: 140 mmol/L (ref 135–145)

## 2020-11-01 LAB — PROTIME-INR
INR: 1.1 (ref 0.8–1.2)
Prothrombin Time: 14 seconds (ref 11.4–15.2)

## 2020-11-01 MED ORDER — FENTANYL CITRATE (PF) 100 MCG/2ML IJ SOLN
INTRAMUSCULAR | Status: AC
  Filled 2020-11-01: qty 2

## 2020-11-01 MED ORDER — HYDRALAZINE HCL 20 MG/ML IJ SOLN
INTRAMUSCULAR | Status: AC
  Filled 2020-11-01: qty 1

## 2020-11-01 MED ORDER — MIDAZOLAM HCL 2 MG/2ML IJ SOLN
INTRAMUSCULAR | Status: AC | PRN
Start: 2020-11-01 — End: 2020-11-01
  Administered 2020-11-01: 1 mg via INTRAVENOUS

## 2020-11-01 MED ORDER — FENTANYL CITRATE (PF) 100 MCG/2ML IJ SOLN
INTRAMUSCULAR | Status: AC | PRN
  Administered 2020-11-01: 25 ug via INTRAVENOUS

## 2020-11-01 MED ORDER — SODIUM CHLORIDE 0.9 % IV SOLN: INTRAVENOUS | Status: AC

## 2020-11-01 MED ORDER — VERAPAMIL HCL 2.5 MG/ML IV SOLN
INTRAVENOUS | Status: AC
  Filled 2020-11-01: qty 2

## 2020-11-01 MED ORDER — HEPARIN SODIUM (PORCINE) 1000 UNIT/ML IJ SOLN
INTRAMUSCULAR | Status: AC
  Filled 2020-11-01: qty 1

## 2020-11-01 MED ORDER — MIDAZOLAM HCL 2 MG/2ML IJ SOLN
INTRAMUSCULAR | Status: AC
  Filled 2020-11-01: qty 2

## 2020-11-01 MED ORDER — IOHEXOL 300 MG/ML  SOLN
100.0000 mL | Freq: Once | INTRAMUSCULAR | Status: AC | PRN
  Administered 2020-11-01: 50 mL via INTRA_ARTERIAL

## 2020-11-01 MED ORDER — VERAPAMIL HCL 2.5 MG/ML IV SOLN: INTRA_ARTERIAL | Status: AC | PRN

## 2020-11-01 MED ORDER — SODIUM CHLORIDE 0.9 % IV SOLN: Freq: Once | INTRAVENOUS | Status: AC

## 2020-11-01 MED ORDER — LIDOCAINE HCL 1 % IJ SOLN
INTRAMUSCULAR | Status: AC
  Filled 2020-11-01: qty 20

## 2020-11-01 MED ORDER — NITROGLYCERIN 1 MG/10 ML FOR IR/CATH LAB: INTRA_ARTERIAL | Status: AC | PRN

## 2020-11-01 NOTE — H&P (Signed)
Chief Complaint: Left sided facial pain with left sided tinnitus. Request is for cerebral angiogram   Referring Physician(s): Deveshwar,Sanjeev  Supervising Physician: Luanne Bras  Patient Status: [facility] - Out-pt  History of Present Illness: [name] is a 70 y.o. female outpatient. 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 Patient reporting ongoing left facial pain with left sided tinnitus X 1 month. Describes pain as intermittent with a whistling sounds in [name] left ear. [name] states that it may be related to [name] allergies. MR brain from 8.4.22 reads  No acute intracranial abnormality. Minimal chronic small vessel ischemic disease. Patient seen in clinch with Dr. Arlean Hopping on 7.8.22 where a diagnostic arteriogram was discussed  to evaluate of the venous drainage particularly the dural sinuses. Patient presents for cerebral angiogram.  Currently without any significant complaints. Patient alert and laying in bed, calm and comfortable. Denies any fevers, headache, chest pain, SOB, cough, abdominal pain, nausea, vomiting or bleeding.  Return precautions and treatment recommendations and follow-up discussed with the patient who is agreeable with the plan.    Past Medical History:  Diagnosis Date   Anxiety    Arthritis    "hands, arms, back, neck, knees, fingers" (01/21/2016)   Atrial fibrillation (HCC)    ASA daiy; came off Coumadin shortly after stroke   Breast cancer, left breast ([place]) 1970s; 2015   Cancer of skin of leg    BLE   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HCC)    left   Chronic lower back pain    Chronic pain    COPD (chronic obstructive pulmonary disease) (HCC)    Coronary artery disease    CVA (cerebral vascular accident) ([place]) pt denies right brain CVA   Right brain secondary to right internal carotid artery dissection for which 2 stents were placed;F/u cerebral angiography in  05/2008-minimal plaque; healing of previously identified left internal carotid artery dissection   Depression    GERD (gastroesophageal reflux disease)    Heart murmur    Hepatic cirrhosis ([place]) 12/20/2017   Hepatitis    History of blood transfusion    "related to spleen"   History of hiatal hernia    History of kidney stones    Hyperlipidemia    Hypertension    Hypothyroidism    Kidney stone    Melanoma of forearm, left ([place])    Migraine    "a few migraines/year" (01/21/2016)   NSTEMI (non-ST elevated myocardial infarction) ([place]) 01/21/2016   Archie Endo 01/21/2016   Obesity    Stroke ([place]) 04/2003   2 carotid artery stents in place; small left parietal and left hemispheric CVA.Phillis Knack 01/21/2016   Tobacco use disorder    50 pack years; questionably discontinued in 2010   Wears glasses     Past Surgical History:  Procedure Laterality Date   ABDOMINAL HYSTERECTOMY  1978   APPENDECTOMY     BREAST BIOPSY Left 1970s; 2015   BREAST LUMPECTOMY Left 1970s   BREAST LUMPECTOMY WITH NEEDLE LOCALIZATION Left 07/07/2013   Procedure: BREAST LUMPECTOMY WITH NEEDLE LOCALIZATION;  Surgeon: Merrie Roof, MD;  Location: [facility];  Service: General;  Laterality: Left;   CARDIAC CATHETERIZATION N/A 01/22/2016   Procedure: Left Heart Cath and Coronary Angiography;  Surgeon: Peter M Martinique, MD;  Location: [facility];  Service: Cardiovascular;  Laterality: N/A;   CARDIAC CATHETERIZATION N/A 01/22/2016   Procedure: Coronary Stent Intervention;  Surgeon: Peter M Martinique, MD;  Location: [facility];  Service: Cardiovascular;  Laterality: N/A;   CAROTID STENT Left 04/2003   small left parietal and left hemispheric CVA/notes 07/29/2010   CHOLECYSTECTOMY OPEN     COLONOSCOPY  2011   COLONOSCOPY N/A 05/10/2019   Procedure: COLONOSCOPY;  Surgeon: Rogene Houston, MD;  [facility];  Service: Endoscopy;  Laterality: N/A;  1:45   CORONARY ATHERECTOMY N/A 12/29/2019    Procedure: CORONARY ATHERECTOMY;  Surgeon: Jettie Booze, MD;  Location: [facility];  Service: Cardiovascular;  Laterality: N/A;  Prox RCA   CORONARY STENT INTERVENTION N/A 12/29/2019   Procedure: CORONARY STENT INTERVENTION;  Surgeon: Jettie Booze, MD;  Location: [facility];  Service: Cardiovascular;  Laterality: N/A;  Prox RCA   ESOPHAGOGASTRODUODENOSCOPY  08/06/2011   Procedure: ESOPHAGOGASTRODUODENOSCOPY (EGD);  Surgeon: Rogene Houston, MD;  [facility];  Service: Endoscopy;  Laterality: N/A;   ESOPHAGOGASTRODUODENOSCOPY N/A 05/10/2019   Procedure: ESOPHAGOGASTRODUODENOSCOPY (EGD);  Surgeon: Rogene Houston, MD;  [facility];  Service: Endoscopy;  Laterality: N/A;   FLEXIBLE SIGMOIDOSCOPY  08/06/2011   Procedure: FLEXIBLE SIGMOIDOSCOPY;  Surgeon: Rogene Houston, MD;  [facility];  Service: Endoscopy;  Laterality: N/A;   INGUINAL HERNIA REPAIR Left 1970s   INTRAVASCULAR ULTRASOUND/IVUS N/A 12/29/2019   Procedure: Intravascular Ultrasound/IVUS;  Surgeon: Jettie Booze, MD;  Location: [facility];  Service: Cardiovascular;  Laterality: N/A;   IR RADIOLOGIST EVAL & MGMT  09/23/2020   LARYNX SURGERY  1970s   Polyps excised   LEFT HEART CATH AND CORONARY ANGIOGRAPHY N/A 12/29/2019   Procedure: LEFT HEART CATH AND CORONARY ANGIOGRAPHY;  Surgeon: Jettie Booze, MD;  Location: [facility];  Service: Cardiovascular;  Laterality: N/A;   MALONEY DILATION  05/10/2019   Procedure: Venia Minks DILATION;  Surgeon: Rogene Houston, MD;  [facility];  Service: Endoscopy;;   MELANOMA EXCISION Left ~ 2016   forearm   POLYPECTOMY  05/10/2019   Procedure: POLYPECTOMY;  Surgeon: Rogene Houston, MD;  [facility];  Service: Endoscopy;;  colon   SPLENECTOMY, 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   spontaneous rupture   TEMPORARY PACEMAKER N/A 12/29/2019   Procedure: TEMPORARY PACEMAKER;  Surgeon: Jettie Booze, MD;   Location: [facility];  Service: Cardiovascular;  Laterality: N/A;    Allergies: Gabapentin, Prednisone, Tape, and Latex  Medications: Prior to Admission medications   Medication Sig Start Date End Date Taking? Authorizing Provider  albuterol (PROVENTIL HFA;VENTOLIN HFA) 108 (90 Base) MCG/ACT inhaler Inhale 2 puffs into the lungs every 6 (six) hours as needed for wheezing or shortness of breath.   Yes [provider]  albuterol (PROVENTIL) (2.5 MG/3ML) 0.083% nebulizer solution Take 2.5 mg by nebulization every 6 (six) hours as needed for wheezing or shortness of breath.   Yes [provider]  amLODipine (NORVASC) 5 MG tablet Take 1 tablet (5 mg total) by mouth daily. 01/09/20 10/30/20 Yes Kilroy, Doreene Burke, PA-C  aspirin EC 81 MG tablet Take 1 tablet (81 mg total) by mouth daily. 05/11/19  Yes Rehman, Mechele Dawley, MD  aspirin-acetaminophen-caffeine (EXCEDRIN MIGRAINE) [phone] MG tablet Take 1 tablet by mouth every 6 (six) hours as needed for headache.   Yes [provider]  atorvastatin (LIPITOR) 80 MG tablet TAKE ONE TABLET BY MOUTH DAILY AT 6 P.M. 08/05/20  Yes Branch, Alphonse Guild, MD  [facility]) 50  MG tablet Take 1.5 tablets (75 mg total) by mouth daily. 10/21/20  Yes Harvel Quale, MD  cetirizine (ZYRTEC) 10 MG tablet Take 10 mg by mouth daily.   Yes [provider]  clopidogrel (PLAVIX) 75 MG tablet Take 1 tablet (75 mg total) by mouth daily. 12/29/19 12/28/20 Yes Jettie Booze, MD  clorazepate (TRANXENE) 7.5 MG tablet Take 7.5 mg by mouth 3 (three) times daily as needed for anxiety.  01/03/16  Yes [provider]  docusate sodium (COLACE) 100 MG capsule Take 200 mg by mouth at bedtime.   Yes [provider]  famotidine (PEPCID) 20 MG tablet Take 1 tablet (20 mg total) by mouth at bedtime. 10/09/19  Yes Laurine Blazer B, PA-C  furosemide (LASIX) 40 MG tablet Take 40 mg by mouth daily as needed for fluid  (leg/hand swelling.).   Yes [provider]  HYDROcodone-acetaminophen (NORCO) 10-325 MG tablet Take 1 tablet by mouth 3 (three) times daily as needed for moderate pain.    Yes [provider]  isosorbide mononitrate (IMDUR) 60 MG 24 hr tablet Take 1 tablet (60 mg total) by mouth daily. 07/31/20 10/30/20 Yes Branch, Alphonse Guild, MD  Ketotifen Fumarate (ALLERGY EYE DROPS OP) Place 1 drop into both eyes daily as needed (allergies).   Yes [provider]  levothyroxine (SYNTHROID, LEVOTHROID) 50 MCG tablet Take 50 mcg by mouth daily before breakfast.  07/20/14  Yes [provider]  losartan (COZAAR) 50 MG tablet TAKE ONE TABLET BY MOUTH ONCE DAILY. 06/20/20  Yes Branch, Alphonse Guild, MD  nitroGLYCERIN (NITROSTAT) 0.4 MG SL tablet Place 0.4 mg under the tongue every 5 (five) minutes as needed for chest pain.     Yes [provider]  ondansetron (ZOFRAN) 4 MG tablet Take 1 tablet (4 mg total) by mouth 3 (three) times daily as needed. 10/09/19  Yes Laurine Blazer B, PA-C  pantoprazole (PROTONIX) 40 MG tablet Take 1 tablet (40 mg total) by mouth daily. 10/09/19  Yes Laurine Blazer B, PA-C  SPIRIVA RESPIMAT 2.5 MCG/ACT AERS Inhale 2 puffs into the lungs daily as needed (shortness of breath).  04/01/18  Yes [provider]  carvedilol (COREG) 3.125 MG tablet TAEK 1 TABLET BY MOUTH TWICE DAILY WITH MEALS. Patient not taking: No sig reported 08/05/20   Arnoldo Lenis, MD  glipiZIDE (GLUCOTROL) 5 MG tablet Take 1 tablet (5 mg total) by mouth daily before breakfast. Patient not taking: Reported on 10/30/2020 04/20/18 12/21/21  Barton Dubois, MD  LORazepam (ATIVAN) 0.5 MG tablet Take 1 tablet (0.5 mg total) by mouth every 8 (eight) hours. Take one pill 30 minutes prior to your MRI 10/21/20   Harvel Quale, MD     Family History  Problem Relation Age of Onset   Heart failure Mother    Cancer Father    Heart failure Father    Cancer Sister    Dementia  Sister    Heart disease Other    Arthritis Other    Cancer Other    Diabetes Other    Kidney disease Other    Cancer Sister    Heart failure Brother     Social History   Socioeconomic History   Marital status: Divorced    Spouse name: Not on file   Number of children: 2   Years of education: Not on file   Highest education level: Not on file  Occupational History   Occupation: Disabled   Occupation: retired  Tobacco Use  Smoking status: Former    Packs/day: 1.00    Years: 40.00    Pack years: 40.00    Types: Cigarettes    Quit date: 07/06/2007    Years since quitting: 13.3   Smokeless tobacco: Never  Vaping Use   Vaping Use: Never used  Substance and Sexual Activity   Alcohol use: No    Alcohol/week: 0.0 standard drinks   Drug use: No   Sexual activity: Not Currently  Other Topics Concern   Not on file  Social History Narrative   ** Merged History Encounter **       Social Determinants of Health   Financial Resource Strain: Not on file  Food Insecurity: Not on file  Transportation Needs: Not on file  Physical Activity: Not on file  Stress: Not on file  Social Connections: Not on file     Review of Systems: A 12 point ROS discussed and pertinent positives are indicated in the HPI above.  All other systems are negative.  Review of Systems  Constitutional:  Negative for fatigue and fever.  HENT:  Negative for congestion.   Respiratory:  Negative for cough and shortness of breath.   Gastrointestinal:  Negative for abdominal pain, diarrhea, nausea and vomiting.   Vital Signs: BP (!) 155/91 (BP Location: Left Arm)   Pulse 79   Temp 98.1 F (36.7 C) (Oral)   Resp 18   Ht '5\' 6"'$  (1.676 m)   Wt 200 lb (90.7 kg)   SpO2 94%   BMI 32.28 kg/m   Physical Exam Vitals and nursing note reviewed.  Constitutional:      Appearance: [name] is well-developed.  HENT:     Head: Normocephalic and atraumatic.  Eyes:     Conjunctiva/sclera: Conjunctivae normal.   Cardiovascular:     Rate and Rhythm: Normal rate and regular rhythm.     Heart sounds: Normal heart sounds.  Pulmonary:     Effort: Pulmonary effort is normal.     Breath sounds: Normal breath sounds.  Musculoskeletal:        General: Normal range of motion.     Cervical back: Normal range of motion.  Skin:    General: Skin is warm.  Neurological:     General: No focal deficit present.     Mental Status: [name] is alert and oriented to person, place, and time.     Motor: No weakness.     Comments: Alert, aware and oriented X 3 Speech and comprehension is intact.  No facial droop noted Tongue midline Can spontaneously move all 4 extremities. Fine motor and coordination slow but intact.    Speech, cognition and language  are generally intact.  Comprehension and fluency are normal.  Judgment and insight normal      Imaging: MR BRAIN WO CONTRAST  Result Date: 10/18/2020 CLINICAL DATA:  TIA. History of stroke. Pulsatile tinnitus. Headaches with left facial pain and left-sided tinnitus for 1 month. EXAM: MRI HEAD WITHOUT CONTRAST TECHNIQUE: Multiplanar, multiecho pulse sequences of the brain and surrounding structures were obtained without intravenous contrast. COMPARISON:  Head CT 10/03/2015 and MRI 05/31/2008 FINDINGS: Brain: There is no evidence of an acute infarct, intracranial hemorrhage, mass, midline shift, or extra-axial fluid collection. The ventricles and sulci are within normal limits for age. Scattered small T2 hyperintensities in the cerebral white matter have slightly progressed from 2010 and are nonspecific but compatible with minimal chronic small vessel ischemic disease. Thin section imaging through the internal auditory canals demonstrates a  normal course of the seventh and eighth cranial nerve complexes bilaterally as well as normal fluid signal in the labyrinthine structures. Vascular: Major intracranial vascular flow voids are preserved. Skull and upper cervical spine:  Unremarkable bone marrow signal. Upper cervical facet arthrosis. Sinuses/Orbits: Unremarkable orbits. Paranasal sinuses and mastoid air cells are clear. Other: None. IMPRESSION: 1. No acute intracranial abnormality. 2. Minimal chronic small vessel ischemic disease. Electronically Signed   By: Logan Bores M.D.   On: 10/18/2020 20:18    Labs:  CBC: Recent Labs    12/27/19 0937 04/16/20 1156 07/08/20 1655 10/22/20 1016  WBC 8.7 10.1 8.5 9.3  HGB 14.7 14.8 14.9 14.6  HCT 43.2 43.6 45.4 43.9  PLT 425* 482* 400 402*    COAGS: Recent Labs    10/22/20 1016  INR 1.0    BMP: Recent Labs    12/27/19 0937 12/29/19 0620 07/08/20 1655 10/22/20 1016  NA 137 139 136 140  K 3.4* 3.4* 3.5 4.1  CL 100 102 101 101  CO2 '30 26 27 '$ 32  GLUCOSE 116* 104* 67* 150*  BUN '13 10 9 11  '$ CALCIUM 9.4 9.2 8.9 9.8  CREATININE 0.67 0.64 0.58 0.71  GFRNONAA >60 >60 >60  --     LIVER FUNCTION TESTS: Recent Labs    04/16/20 1156 10/22/20 1016  BILITOT 0.5 0.5  AST 24 21  ALT 17 16  PROT 7.3 7.3    TUMOR MARKERS: Recent Labs    04/16/20 1156  AFPTM 4.1    Assessment and Plan:  70 y.o. female outpatient. 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 Patient reporting ongoing left facial pain with left sided tinnitus X 1 month. Describes pain as intermittent with a whistling sounds in [name] left ear. [name] states that it may be related to [name] allergies. MR brain from 8.4.22 reads  No acute intracranial abnormality. Minimal chronic small vessel ischemic disease. Patient seen in clinch with Dr. Arlean Hopping on 7.8.22 where a diagnostic arteriogram was discussed  to evaluate of the venous drainage particularly the dural sinuses. Patient presents for cerebral angiogram   Patient is on Plavix. Last dose on 8.18.22 All other medications and labs are within acceptable parameters. Allergies include tape, latex and prednisone.    Risks and benefits of cerebral angiogram were discussed with the patient including, but not limited to bleeding, infection, vascular injury or contrast induced renal failure.  This interventional procedure involves the use of X-rays and because of the nature of the planned procedure, it is possible that we will have prolonged use of X-ray fluoroscopy.  Potential radiation risks to [name] include (but are not limited to) the following: - A slightly elevated risk for cancer  several years later in life. This risk is typically less than 0.5% percent. This risk is low in comparison to the normal incidence of human cancer, which is 33% for women and 50% for men according to the [place]. - Radiation induced injury can include skin redness, resembling a rash, tissue breakdown / ulcers and hair loss (which can be temporary or permanent).   The likelihood of either of these occurring depends on the difficulty of the procedure and whether [name] are sensitive to radiation due to previous procedures, disease, or genetic conditions.   IF your procedure requires a prolonged use of radiation, [name] will be notified and given written instructions for further action.  It is your  responsibility to monitor the irradiated area for the 2 weeks following the procedure and to notify your physician if [name] are concerned that [name] have suffered a radiation induced injury.    All of the patient's questions were answered, patient is agreeable to proceed.  Consent signed and in chart.    Thank [name] for this interesting consult.  I greatly enjoyed meeting [name] and look forward to participating in [name] care.  A copy of this report was sent to the requesting provider on this date.  Electronically Signed: Jacqualine Mau, NP 11/01/2020, 9:09 AM   I spent a total of  30 Minutes   in face to face in clinical consultation, greater than 50% of which was counseling/coordinating care for left sided  facial pain with left sided tinnitus.

## 2020-11-01 NOTE — Sedation Documentation (Signed)
Doctor talking to patient after procedure.

## 2020-11-01 NOTE — Procedures (Signed)
S/P 4 vessel cerebral arteriogram. RT Rad approach. Findings. 1.Completely remodeled Lt ICA intra and extracranially.  2.. Prominent high riding  RT IJ bulb.    S.Tinsley Everman MD

## 2020-11-04 ENCOUNTER — Other Ambulatory Visit: Payer: Self-pay | Admitting: Cardiology

## 2020-11-04 ENCOUNTER — Ambulatory Visit (HOSPITAL_COMMUNITY): Payer: Medicare Other

## 2020-11-05 ENCOUNTER — Encounter (HOSPITAL_COMMUNITY): Payer: Self-pay

## 2020-11-06 ENCOUNTER — Ambulatory Visit (HOSPITAL_COMMUNITY): Payer: Medicare Other

## 2020-11-11 ENCOUNTER — Inpatient Hospital Stay (HOSPITAL_COMMUNITY): Admission: RE | Admit: 2020-11-11 | Payer: Medicare Other | Source: Ambulatory Visit

## 2020-11-11 ENCOUNTER — Ambulatory Visit (HOSPITAL_COMMUNITY): Payer: Medicare Other | Admitting: Speech Pathology

## 2020-11-15 ENCOUNTER — Ambulatory Visit (HOSPITAL_COMMUNITY): Payer: Medicare Other

## 2020-11-21 ENCOUNTER — Ambulatory Visit (HOSPITAL_COMMUNITY): Payer: Medicare Other

## 2020-12-10 ENCOUNTER — Other Ambulatory Visit: Payer: Self-pay | Admitting: Interventional Cardiology

## 2021-01-13 ENCOUNTER — Other Ambulatory Visit (INDEPENDENT_AMBULATORY_CARE_PROVIDER_SITE_OTHER): Payer: Self-pay | Admitting: *Deleted

## 2021-01-13 MED ORDER — PANTOPRAZOLE SODIUM 40 MG PO TBEC: 40.0000 mg | DELAYED_RELEASE_TABLET | Freq: Every day | ORAL | 1 refills | Status: DC

## 2021-02-04 ENCOUNTER — Other Ambulatory Visit: Payer: Self-pay | Admitting: Cardiology

## 2021-02-18 ENCOUNTER — Ambulatory Visit (INDEPENDENT_AMBULATORY_CARE_PROVIDER_SITE_OTHER): Payer: Medicare Other | Admitting: Internal Medicine

## 2021-02-20 ENCOUNTER — Ambulatory Visit (INDEPENDENT_AMBULATORY_CARE_PROVIDER_SITE_OTHER): Payer: Medicare Other | Admitting: Cardiology

## 2021-02-20 ENCOUNTER — Encounter: Payer: Self-pay | Admitting: Cardiology

## 2021-02-20 VITALS — BP 130/80 | HR 83 | Ht 63.0 in | Wt 202.0 lb

## 2021-02-20 DIAGNOSIS — I10 Essential (primary) hypertension: Secondary | ICD-10-CM

## 2021-02-20 DIAGNOSIS — I2510 Atherosclerotic heart disease of native coronary artery without angina pectoris: Secondary | ICD-10-CM | POA: Diagnosis not present

## 2021-02-20 DIAGNOSIS — I5032 Chronic diastolic (congestive) heart failure: Secondary | ICD-10-CM

## 2021-02-20 DIAGNOSIS — E782 Mixed hyperlipidemia: Secondary | ICD-10-CM

## 2021-02-20 MED ORDER — ISOSORBIDE MONONITRATE ER 120 MG PO TB24: 120.0000 mg | ORAL_TABLET | Freq: Every day | ORAL | 3 refills | Status: DC

## 2021-02-20 NOTE — Progress Notes (Signed)
Clinical Summary Ms. Mckenney is a 70 y.o.female  1. CAD - admit 01/2016 with NSTEMI. Full cath report below. Received DES to OM. Unsuccesful PCI to RCA with wire induced dissection, managed medically. The OM was thought to be the culprit. If recurring symptoms can consider repeat RCA intervention. .      12/2019 cath DES to RCA. RCA was prone to vasospasm during procedure   07/08/20 ER visit with chest pain - did not complete workup in ER, left ER - trop was neg, EKG SR no ischemic changes     08/2020 nuclear stress: breast attenuatin vs mild anterior ischemia, low risk - nonspecific chest pains at times, can feel hot and diaphoertic.     2. COPD - 04/2018 admission with COPD exacerbation      3. Chronic diastolic HF  - no recent symptoms   4. HTN - had some bilateral LE edema 12/2019, norvasc was lowered to 5mg  daily.  - compliant withmeds   4. Bradycardia - admit 08/2016 with bradycardia - resolved on lower dose of coreg   5. History of CVA - 2005 at [facility] - she reports history of stents at the time . Followed by Dr Estanislado Pandy, 10/2020 monitoring cerebral angiogram.      6. Hyperlipedemia   Jan 2022 TC 128 TG 115 HDL 38 LDL 69 - compliant with statin   7. 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   Past Medical History:  Diagnosis Date   Anxiety    Arthritis    "hands, arms, back, neck, knees, fingers" (01/21/2016)   Atrial fibrillation (HCC)    ASA daiy; came off Coumadin shortly after stroke   Breast cancer, left breast ([place]) 1970s; 2015   Cancer of skin of leg    BLE   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HCC)    left   Chronic lower back pain    Chronic pain    COPD (chronic obstructive pulmonary disease) (HCC)    Coronary artery disease    CVA (cerebral vascular accident) ([place]) pt denies right brain CVA   Right  brain secondary to right internal carotid artery dissection for which 2 stents were placed;F/u cerebral angiography in 05/2008-minimal plaque; healing of previously identified left internal carotid artery dissection   Depression    GERD (gastroesophageal reflux disease)    Heart murmur    Hepatic cirrhosis ([place]) 12/20/2017   Hepatitis    History of blood transfusion    "related to spleen"   History of hiatal hernia    History of kidney stones    Hyperlipidemia    Hypertension    Hypothyroidism    Kidney stone    Melanoma of forearm, left ([place])    Migraine    "a few migraines/year" (01/21/2016)   NSTEMI (non-ST elevated myocardial infarction) ([place]) 01/21/2016   Archie Endo 01/21/2016   Obesity    Stroke ([place]) 04/2003   2 carotid artery stents in place; small left parietal and left hemispheric CVA.Phillis Knack 01/21/2016   Tobacco use disorder    50 pack years; questionably discontinued in 2010   Wears glasses      Allergies  Allergen Reactions   Gabapentin Nausea And Vomiting   Prednisone Palpitations   Tape Other (See Comments)    Causes blisters to form   Latex Rash and Other (See Comments)  Blisters     Current Outpatient Medications  Medication Sig Dispense Refill   albuterol (PROVENTIL HFA;VENTOLIN HFA) 108 (90 Base) MCG/ACT inhaler Inhale 2 puffs into the lungs every 6 (six) hours as needed for wheezing or shortness of breath.     albuterol (PROVENTIL) (2.5 MG/3ML) 0.083% nebulizer solution Take 2.5 mg by nebulization every 6 (six) hours as needed for wheezing or shortness of breath.     amLODipine (NORVASC) 5 MG tablet Take 1 tablet (5 mg total) by mouth daily. 90 tablet 3   aspirin EC 81 MG tablet Take 1 tablet (81 mg total) by mouth daily.     aspirin-acetaminophen-caffeine (EXCEDRIN MIGRAINE) 250-250-65 MG tablet Take 1 tablet by mouth every 6 (six) hours as needed for headache.     atorvastatin (LIPITOR) 80 MG tablet TAKE ONE TABLET BY MOUTH DAILY AT 6 P.M. 90 tablet 1    azaTHIOprine (IMURAN) 50 MG tablet Take 1.5 tablets (75 mg total) by mouth daily. 45 tablet 5   carvedilol (COREG) 3.125 MG tablet TAEK 1 TABLET BY MOUTH TWICE DAILY WITH MEALS. (Patient not taking: No sig reported) 180 tablet 0   cetirizine (ZYRTEC) 10 MG tablet Take 10 mg by mouth daily.     clopidogrel (PLAVIX) 75 MG tablet TAKE (1) TABLET BY MOUTH ONCE DAILY. 90 tablet 3   clorazepate (TRANXENE) 7.5 MG tablet Take 7.5 mg by mouth 3 (three) times daily as needed for anxiety.      docusate sodium (COLACE) 100 MG capsule Take 200 mg by mouth at bedtime.     famotidine (PEPCID) 20 MG tablet Take 1 tablet (20 mg total) by mouth at bedtime. 30 tablet 11   furosemide (LASIX) 40 MG tablet Take 40 mg by mouth daily as needed for fluid (leg/hand swelling.).     glipiZIDE (GLUCOTROL) 5 MG tablet Take 1 tablet (5 mg total) by mouth daily before breakfast. (Patient not taking: Reported on 10/30/2020) 30 tablet 3   HYDROcodone-acetaminophen (NORCO) 10-325 MG tablet Take 1 tablet by mouth 3 (three) times daily as needed for moderate pain.      isosorbide mononitrate (IMDUR) 60 MG 24 hr tablet Take 1 tablet (60 mg total) by mouth daily. 90 tablet 3   Ketotifen Fumarate (ALLERGY EYE DROPS OP) Place 1 drop into both eyes daily as needed (allergies).     levothyroxine (SYNTHROID, LEVOTHROID) 50 MCG tablet Take 50 mcg by mouth daily before breakfast.      LORazepam (ATIVAN) 0.5 MG tablet Take 1 tablet (0.5 mg total) by mouth every 8 (eight) hours. Take one pill 30 minutes prior to your MRI 1 tablet 0   losartan (COZAAR) 50 MG tablet TAKE ONE TABLET BY MOUTH ONCE DAILY. 90 tablet 3   nitroGLYCERIN (NITROSTAT) 0.4 MG SL tablet Place 0.4 mg under the tongue every 5 (five) minutes as needed for chest pain.       ondansetron (ZOFRAN) 4 MG tablet Take 1 tablet (4 mg total) by mouth 3 (three) times daily as needed. 30 tablet 1   pantoprazole (PROTONIX) 40 MG tablet Take 1 tablet (40 mg total) by mouth daily. 90 tablet 1    SPIRIVA RESPIMAT 2.5 MCG/ACT AERS Inhale 2 puffs into the lungs daily as needed (shortness of breath).      Current Facility-Administered Medications  Medication Dose Route Frequency Provider Last Rate Last Admin   sodium chloride flush (NS) 0.9 % injection 3 mL  3 mL Intravenous Q12H Amiir Heckard, Alphonse Guild, MD  Past Surgical History:  Procedure Laterality Date   ABDOMINAL HYSTERECTOMY  1978   APPENDECTOMY     BREAST BIOPSY Left 1970s; 2015   BREAST LUMPECTOMY Left 1970s   BREAST LUMPECTOMY WITH NEEDLE LOCALIZATION Left 07/07/2013   Procedure: BREAST LUMPECTOMY WITH NEEDLE LOCALIZATION;  Surgeon: Merrie Roof, MD;  Location: [facility];  Service: General;  Laterality: Left;   CARDIAC CATHETERIZATION N/A 01/22/2016   Procedure: Left Heart Cath and Coronary Angiography;  Surgeon: Peter M Martinique, MD;  Location: [facility];  Service: Cardiovascular;  Laterality: N/A;   CARDIAC CATHETERIZATION N/A 01/22/2016   Procedure: Coronary Stent Intervention;  Surgeon: Peter M Martinique, MD;  Location: [facility];  Service: Cardiovascular;  Laterality: N/A;   CAROTID STENT Left 04/2003   small left parietal and left hemispheric CVA/notes 07/29/2010   CHOLECYSTECTOMY OPEN     COLONOSCOPY  2011   COLONOSCOPY N/A 05/10/2019   Procedure: COLONOSCOPY;  Surgeon: Rogene Houston, MD;  [facility];  Service: Endoscopy;  Laterality: N/A;  1:45   CORONARY ATHERECTOMY N/A 12/29/2019   Procedure: CORONARY ATHERECTOMY;  Surgeon: Jettie Booze, MD;  Location: [facility];  Service: Cardiovascular;  Laterality: N/A;  Prox RCA   CORONARY STENT INTERVENTION N/A 12/29/2019   Procedure: CORONARY STENT INTERVENTION;  Surgeon: Jettie Booze, MD;  Location: [facility];  Service: Cardiovascular;  Laterality: N/A;  Prox RCA   ESOPHAGOGASTRODUODENOSCOPY  08/06/2011   Procedure: ESOPHAGOGASTRODUODENOSCOPY (EGD);  Surgeon: Rogene Houston, MD;  [facility];  Service: Endoscopy;  Laterality: N/A;   ESOPHAGOGASTRODUODENOSCOPY N/A 05/10/2019   Procedure: ESOPHAGOGASTRODUODENOSCOPY (EGD);  Surgeon: Rogene Houston, MD;  [facility];  Service: Endoscopy;  Laterality: N/A;   FLEXIBLE SIGMOIDOSCOPY  08/06/2011   Procedure: FLEXIBLE SIGMOIDOSCOPY;  Surgeon: Rogene Houston, MD;  [facility];  Service: Endoscopy;  Laterality: N/A;   INGUINAL HERNIA REPAIR Left 1970s   INTRAVASCULAR ULTRASOUND/IVUS N/A 12/29/2019   Procedure: Intravascular Ultrasound/IVUS;  Surgeon: Jettie Booze, MD;  Location: [facility];  Service: Cardiovascular;  Laterality: N/A;   IR ANGIO INTRA EXTRACRAN SEL COM CAROTID INNOMINATE BILAT MOD SED  11/01/2020   IR ANGIO VERTEBRAL SEL VERTEBRAL BILAT MOD SED  11/01/2020   IR RADIOLOGIST EVAL & MGMT  09/23/2020   IR US GUIDE VASC ACCESS RIGHT  11/01/2020   LARYNX SURGERY  1970s   Polyps excised   LEFT HEART CATH AND CORONARY ANGIOGRAPHY N/A 12/29/2019   Procedure: LEFT HEART CATH AND CORONARY ANGIOGRAPHY;  Surgeon: Jettie Booze, MD;  Location: [facility];  Service: Cardiovascular;  Laterality: N/A;   MALONEY DILATION  05/10/2019   Procedure: Venia Minks DILATION;  Surgeon: Rogene Houston, MD;  [facility];  Service: Endoscopy;;   MELANOMA EXCISION Left ~ 2016   forearm   POLYPECTOMY  05/10/2019   Procedure: POLYPECTOMY;  Surgeon: Rogene Houston, MD;  [facility];  Service: Endoscopy;;  colon   SPLENECTOMY, 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   spontaneous rupture   TEMPORARY PACEMAKER N/A 12/29/2019   Procedure: TEMPORARY PACEMAKER;  Surgeon: Jettie Booze, MD;  Location: [facility];  Service: Cardiovascular;  Laterality: N/A;     Allergies  Allergen Reactions   Gabapentin Nausea And Vomiting   Prednisone Palpitations   Tape Other (See Comments)    Causes blisters to form   Latex Rash and Other (See  Comments)    Blisters      Family History   Problem Relation Age of Onset   Heart failure Mother    Cancer Father    Heart failure Father    Cancer Sister    Dementia Sister    Heart disease Other    Arthritis Other    Cancer Other    Diabetes Other    Kidney disease Other    Cancer Sister    Heart failure Brother      Social History Ms. Sudano reports that she quit smoking about 13 years ago. Her smoking use included cigarettes. She has a 40.00 pack-year smoking history. She has never used smokeless tobacco. Ms. Cheese reports no history of alcohol use.   Review of Systems CONSTITUTIONAL: No weight loss, fever, chills, weakness or fatigue.  HEENT: Eyes: No visual loss, blurred vision, double vision or yellow sclerae.No hearing loss, sneezing, congestion, runny nose or sore throat.  SKIN: No rash or itching.  CARDIOVASCULAR: per hpi RESPIRATORY: No shortness of breath, cough or sputum.  GASTROINTESTINAL: No anorexia, nausea, vomiting or diarrhea. No abdominal pain or blood.  GENITOURINARY: No burning on urination, no polyuria NEUROLOGICAL: No headache, dizziness, syncope, paralysis, ataxia, numbness or tingling in the extremities. No change in bowel or bladder control.  MUSCULOSKELETAL: No muscle, back pain, joint pain or stiffness.  LYMPHATICS: No enlarged nodes. No history of splenectomy.  PSYCHIATRIC: No history of depression or anxiety.  ENDOCRINOLOGIC: No reports of sweating, cold or heat intolerance. No polyuria or polydipsia.  Marland Kitchen   Physical Examination Today's Vitals   02/20/21 1128  BP: 130/80  Pulse: 83  SpO2: 95%  Weight: 202 lb (91.6 kg)  Height: 5\' 3"  (1.6 m)   Body mass index is 35.78 kg/m.  Gen: resting comfortably, no acute distress HEENT: no scleral icterus, pupils equal round and reactive, no palptable cervical adenopathy,  CV: RRR, no m/r/g, no jvd Resp: Clear to auscultation bilaterally GI: abdomen is soft, non-tender, non-distended, normal bowel sounds, no hepatosplenomegaly MSK:  extremities are warm, no edema.  Skin: warm, no rash Neuro:  no focal deficits Psych: appropriate affect   Diagnostic Studies 01/2016 cath The left ventricular systolic function is normal. LV end diastolic pressure is normal. The left ventricular ejection fraction is 55-65% by visual estimate. Prox RCA lesion, 90 %stenosed. 1st Diag lesion, 60 %stenosed. Prox LAD to Mid LAD lesion, 20 %stenosed. 2nd Diag lesion, 40 %stenosed. Ost Ramus to Ramus lesion, 50 %stenosed. 1st Mrg lesion, 99 %stenosed. A STENT SYNERGY DES 2.75X20 drug eluting stent was successfully placed. Post intervention, there is a 0% residual stenosis. Ost RCA to Prox RCA lesion, 90 %stenosed. Post intervention, there is a 90% residual stenosis with dissection from the proximal to mid RCA.   1. Severe 2 vessel obstructive CAD 2. Normal LV function and LV EDP 3. Successful stenting of the first OM with DES. This appears to be the culprit vessel. 4. Unsuccessful PCI of the RCA due to wire induced dissection of the vessel.    Plan: DAPT for one year. Aggressive BP control. Will continue IV Ntg overnight. Despite RCA dissection patient is pain free, hemodynamically stable and has a normal Ecg. If her symptoms remain stable I would treat medically and allow the RCA to heal. If she continues to have angina attempt at PCI of the RCA could be considered once the vessel has had time to heal - 6-8 weeks.      01/2016 echo Study Conclusions   -  Left ventricle: The cavity size was normal. Systolic function was   vigorous. The estimated ejection fraction was in the range of 65%   to 70%. Wall motion was normal; there were no regional wall   motion abnormalities. There was an increased relative   contribution of atrial contraction to ventricular filling.   Doppler parameters are consistent with abnormal left ventricular   relaxation (grade 1 diastolic dysfunction). Doppler parameters   are consistent with high ventricular  filling pressure. - Tricuspid valve: There was trivial regurgitation. - Pulmonary arteries: PA peak pressure: 32 mm Hg (S). - Pericardium, extracardiac: A trivial, free-flowing pericardial   effusion was identified along the right ventricular free wall.   The fluid had no internal echoes.     12/2019 cath Previously placed 1st Mrg drug eluting stent is widely patent. Ost RCA to Prox RCA lesion is 90% stenosed. After orbital atherectomy drug-eluting stent was successfully placed using a STENT RESOLUTE ONYX 3.0X26, and postdilated to 3.75 mm. Stent was optimized with IVUS. Post intervention, there is a 0% residual stenosis. The left ventricular systolic function is normal. LV end diastolic pressure is normal. The left ventricular ejection fraction is 55-65% by visual estimate. There is no aortic valve stenosis. RCA was prone to vasospasm during the PCI.   Continue dual antiplatelet therapy for at least 6 months.  Consider clopidogrel monotherapy long-term given some diffuse disease in her LAD system.  Continue with attempts at aggressive secondary prevention.   Plan for same day discharge.    08/2020 nuclear stress Lexiscan stress is electrically negative for ischemia Myoview scan shows mild thinning of anterior wall (mid/distal) with decreased tracer activity consistent with mild anterior ischemia; Cannot exclude however that changes are due to shifting breast attenuation. LVEF calculated at 80% This is a low risk study.  Assessment and Plan   1. CAD - nonspecific chest pains and diaphoresis at times - recent stress test overall benign - prior issues with coronary vasospasm, we will try increased imdur to 120mg  daily and monitoring symptoms.     2.HTN - at goal, continue current meds - LE edema on higher norvasc dosing.    4. Chronic diastolic HF - euvolemic without symptoms, continue diuretic.      5. HTN -bp at goal, continue current meds   6. Hyperlipidemia -has  been at goal, continue atorvastatin      Arnoldo Lenis, M.D.

## 2021-02-20 NOTE — Patient Instructions (Signed)
Medication Instructions:  Your physician has recommended you make the following change in your medication:  INCREASE Imdur to 120 mg tablets daily  *If you need a refill on your cardiac medications before your next appointment, please call your pharmacy*   Lab Work: We have requested your most recent lab work from you primary care provider If you have labs (blood work) drawn today and your tests are completely normal, you will receive your results only by: [place] (if you have MyChart) OR A paper copy in the mail If you have any lab test that is abnormal or we need to change your treatment, we will call you to review the results.   Testing/Procedures: None   Follow-Up: At [facility], you and your health needs are our priority.  As part of our continuing mission to provide you with exceptional heart care, we have created designated Provider Care Teams.  These Care Teams include your primary Cardiologist (physician) and Advanced Practice Providers (APPs -  Physician Assistants and Nurse Practitioners) who all work together to provide you with the care you need, when you need it.  We recommend signing up for the patient portal called "MyChart".  Sign up information is provided on this After Visit Summary.  MyChart is used to connect with patients for Virtual Visits (Telemedicine).  Patients are able to view lab/test results, encounter notes, upcoming appointments, etc.  Non-urgent messages can be sent to your provider as well.   To learn more about what you can do with MyChart, go to [email or web address].    Your next appointment:   6 month(s)  The format for your next appointment:   In Person  Provider:   Carlyle Dolly, MD    Other Instructions

## 2021-03-04 ENCOUNTER — Other Ambulatory Visit: Payer: Self-pay | Admitting: Cardiology

## 2021-04-14 ENCOUNTER — Other Ambulatory Visit (HOSPITAL_COMMUNITY): Payer: Self-pay | Admitting: Nurse Practitioner

## 2021-04-14 DIAGNOSIS — Z780 Asymptomatic menopausal state: Secondary | ICD-10-CM

## 2021-04-15 ENCOUNTER — Telehealth: Payer: Self-pay | Admitting: Cardiology

## 2021-04-15 NOTE — Telephone Encounter (Signed)
Pt c/o medication issue:  1. Name of Medication: isosorbide mononitrate (IMDUR) 120 MG 24 hr tablet  2. How are you currently taking this medication (dosage and times per day)? Take 1 tablet (120 mg total) by mouth daily.  3. Are you having a reaction (difficulty breathing--STAT)? no  4. What is your medication issue? Patient calling to see if it okay to take the above Isosorbide and Jardiance together. Her dr prescribe her with Jardiance (10mg  every morning). Patient is calling in to make sure its safe to take the two. Please advise

## 2021-04-15 NOTE — Telephone Encounter (Signed)
Ok to take jardiance and imdur together  Zandra Abts MD

## 2021-04-16 NOTE — Telephone Encounter (Signed)
Pt notified that is ok to take Imdur and Jardiance together.

## 2021-04-24 ENCOUNTER — Ambulatory Visit (INDEPENDENT_AMBULATORY_CARE_PROVIDER_SITE_OTHER): Payer: Medicare Other | Admitting: Gastroenterology

## 2021-04-24 ENCOUNTER — Telehealth (INDEPENDENT_AMBULATORY_CARE_PROVIDER_SITE_OTHER): Payer: Self-pay | Admitting: *Deleted

## 2021-04-24 NOTE — Telephone Encounter (Signed)
Called pt to see if she could come in earlier today at 10 instead of 11:30 and she states she is not able to make appt today her son is having surgery and wants to reschedule. Please call pt to reschedule

## 2021-04-29 ENCOUNTER — Other Ambulatory Visit: Payer: Self-pay

## 2021-04-29 ENCOUNTER — Ambulatory Visit (HOSPITAL_COMMUNITY)
Admission: RE | Admit: 2021-04-29 | Discharge: 2021-04-29 | Disposition: A | Discharge status: Home / Self Care | Payer: Medicare Other | Source: Ambulatory Visit | Attending: Nurse Practitioner | Admitting: Nurse Practitioner

## 2021-04-29 DIAGNOSIS — Z780 Asymptomatic menopausal state: Secondary | ICD-10-CM | POA: Diagnosis present

## 2021-05-06 ENCOUNTER — Other Ambulatory Visit (INDEPENDENT_AMBULATORY_CARE_PROVIDER_SITE_OTHER): Payer: Self-pay | Admitting: Gastroenterology

## 2021-05-06 DIAGNOSIS — K754 Autoimmune hepatitis: Secondary | ICD-10-CM

## 2021-06-06 ENCOUNTER — Encounter: Payer: Self-pay | Admitting: *Deleted

## 2021-06-06 ENCOUNTER — Encounter: Payer: Self-pay | Admitting: Cardiology

## 2021-06-06 ENCOUNTER — Ambulatory Visit (INDEPENDENT_AMBULATORY_CARE_PROVIDER_SITE_OTHER): Payer: Medicare Other | Admitting: Cardiology

## 2021-06-06 VITALS — BP 136/82 | HR 78 | Ht 63.0 in | Wt 189.0 lb

## 2021-06-06 DIAGNOSIS — I10 Essential (primary) hypertension: Secondary | ICD-10-CM

## 2021-06-06 DIAGNOSIS — E782 Mixed hyperlipidemia: Secondary | ICD-10-CM | POA: Diagnosis not present

## 2021-06-06 DIAGNOSIS — I5032 Chronic diastolic (congestive) heart failure: Secondary | ICD-10-CM | POA: Diagnosis not present

## 2021-06-06 DIAGNOSIS — I2510 Atherosclerotic heart disease of native coronary artery without angina pectoris: Secondary | ICD-10-CM | POA: Diagnosis not present

## 2021-06-06 NOTE — Patient Instructions (Signed)
Medication Instructions:  Continue all current medications.   Labwork: none  Testing/Procedures: none  Follow-Up: 6 months   Any Other Special Instructions Will Be Listed Below (If Applicable).   If you need a refill on your cardiac medications before your next appointment, please call your pharmacy.  

## 2021-06-06 NOTE — Progress Notes (Signed)
? ? ? ?Clinical Summary ?[name] is a 71 y.o.female seen today for follow up of the following medical problems.  ? ?1. CAD ?- admit 01/2016 with NSTEMI. Full cath report below. Received DES to OM. Unsuccesful PCI to RCA with wire induced dissection, managed medically. The OM was thought to be the culprit. If recurring symptoms can consider repeat RCA intervention. .  ?  ?  ?12/2019 cath DES to RCA. RCA was prone to vasospasm during procedure ?  ?08/2020 nuclear stress: breast attenuatin vs mild anterior ischemia, low risk ?- no significant chest pains.  ?- no SOB/DOE ?- compliant with meds  ?  ?  ?2. COPD ?- 04/2018 admission with COPD exacerbation ?  ?  ?  ?3. Chronic diastolic HF ? - occasional LE edema, compliant with diuretic.  ?  ?4. HTN ?- had some bilateral LE edema 12/2019, norvasc was lowered to '5mg'$  daily.  ?- she is compliant with meds but has not taken yet this AM ?  ?4. Bradycardia ?- admit 08/2016 with bradycardia ?- resolved on lower dose of coreg ?  ?5. History of CVA ?- 2005 at [facility] ?- she reports history of stents at the time . Followed by Dr Estanislado Pandy, 10/2020 monitoring cerebral angiogram.  ?  ?  ?6. Hyperlipedemia ?  ?Jan 2022 TC 128 TG 115 HDL 38 LDL 69 ?- compliant with statin ?  ?7. 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  ?  ?SH: ?Son is on dialysis and has had other health issues, has been very stressful. ?Past Medical History:  ?Diagnosis Date  ? Anxiety   ? Arthritis   ? "hands, arms, back, neck, knees, fingers" (01/21/2016)  ? Atrial fibrillation ([place])   ? ASA daiy; came off Coumadin shortly after stroke  ? Breast cancer, left breast ([place]) 1970s; 2015  ? Cancer of skin of leg   ? BLE  ? 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 left  ? Chronic lower back pain   ? Chronic pain   ? COPD (chronic obstructive pulmonary disease) ([place])   ? Coronary artery disease   ? CVA (cerebral vascular accident) [facility]) pt denies right brain CVA  ? Right  brain secondary to right internal carotid artery dissection for which 2 stents were placed;F/u cerebral angiography in 05/2008-minimal plaque; healing of previously identified left internal carotid artery dissection  ? Depression   ? GERD (gastroesophageal reflux disease)   ? Heart murmur   ? Hepatic cirrhosis ([place]) 12/20/2017  ? Hepatitis   ? History of blood transfusion   ? "related to spleen"  ? History of hiatal hernia   ? History of kidney stones   ? Hyperlipidemia   ? Hypertension   ? Hypothyroidism   ? Kidney stone   ? Melanoma of forearm, left ([place])   ? Migraine   ? "a few migraines/year" (01/21/2016)  ? NSTEMI (non-ST elevated myocardial infarction) ([place]) 01/21/2016  ? Archie Endo 01/21/2016  ? Obesity   ? Stroke [facility]) 04/2003  ? 2 carotid artery stents in place; small left parietal and left hemispheric CVA.Phillis Knack 01/21/2016  ? Tobacco use disorder   ? 50 pack years; questionably discontinued in 2010  ? Wears glasses   ? ? ? ?Allergies  ?Allergen Reactions  ? Gabapentin Nausea And Vomiting  ? Prednisone Palpitations  ? Tape Other (See Comments)  ?  Causes blisters to form  ? Latex Rash and Other (See Comments)  ?  Blisters  ? ? ? ?Current Outpatient Medications  ?Medication Sig Dispense Refill  ? albuterol (PROVENTIL HFA;VENTOLIN HFA) 108 (90 Base) MCG/ACT inhaler Inhale 2 puffs into the lungs every 6 (six) hours as needed for wheezing or shortness of breath.    ? albuterol (PROVENTIL) (2.5 MG/3ML) 0.083% nebulizer solution Take 2.5 mg by nebulization every 6 (six) hours as needed for wheezing or shortness of breath.    ? amLODipine (NORVASC) 5 MG tablet TAKE 1 TABLET BY MOUTH ONCE DAILY. 90 tablet 1  ? aspirin EC 81 MG tablet Take 1 tablet (81 mg total) by mouth daily.    ? aspirin-acetaminophen-caffeine (EXCEDRIN MIGRAINE) 250-250-65 MG tablet Take 1 tablet by mouth every 6 (six) hours as needed for headache.    ? atorvastatin (LIPITOR) 80 MG tablet TAKE ONE TABLET BY MOUTH DAILY AT 6 P.M. 90 tablet 1  ?  azaTHIOprine (IMURAN) 50 MG tablet TAKE 1 AND 1/2 TABLETS BY MOUTH ONCE DAILY. 180 tablet 0  ? carvedilol (COREG) 3.125 MG tablet TAEK 1 TABLET BY MOUTH TWICE DAILY WITH MEALS. 180 tablet 0  ? cetirizine (ZYRTEC) 10 MG tablet Take 10 mg by mouth daily.    ? clopidogrel (PLAVIX) 75 MG tablet TAKE (1) TABLET BY MOUTH ONCE DAILY. 90 tablet 3  ? clorazepate (TRANXENE) 7.5 MG tablet Take 7.5 mg by mouth 3 (three) times daily as needed for anxiety.     ? docusate sodium (COLACE) 100 MG capsule Take 200 mg by mouth at bedtime.    ? famotidine (PEPCID) 20 MG tablet Take 1 tablet (20 mg total) by mouth at bedtime. 30 tablet 11  ? furosemide (LASIX) 40 MG tablet Take 40 mg by mouth daily as needed for fluid (leg/hand swelling.).    ? glipiZIDE (GLUCOTROL) 5 MG tablet Take 1 tablet (5 mg total) by mouth daily before breakfast. (Patient not taking: Reported on 02/20/2021) 30 tablet 3  ? HYDROcodone-acetaminophen (NORCO) 10-325 MG tablet Take 1 tablet by mouth 3 (three) times daily as needed for moderate pain.     ? isosorbide mononitrate (IMDUR) 120 MG 24 hr tablet Take 1 tablet (120 mg total) by mouth daily. 90 tablet 3  ? Ketotifen Fumarate (ALLERGY EYE DROPS OP) Place 1 drop into both eyes daily as needed (allergies).    ? levothyroxine (SYNTHROID, LEVOTHROID) 50 MCG tablet Take 50 mcg by mouth daily before breakfast.     ? LORazepam (ATIVAN) 0.5 MG tablet Take 1 tablet (0.5 mg total) by mouth every 8 (eight) hours. Take one pill 30 minutes prior to your MRI (Patient not taking: Reported on 02/20/2021) 1 tablet 0  ? losartan (COZAAR) 50 MG tablet TAKE ONE TABLET BY MOUTH ONCE DAILY. 90 tablet 3  ? nitroGLYCERIN (NITROSTAT) 0.4 MG SL tablet Place 0.4 mg under the tongue every 5 (five) minutes as needed for chest pain.      ? ondansetron (ZOFRAN) 4 MG tablet Take 1 tablet (4 mg total) by mouth 3 (three) times daily as needed. 30 tablet 1  ? pantoprazole (PROTONIX) 40 MG tablet Take 1 tablet (40 mg total) by mouth daily. 90  tablet 1  ? SPIRIVA RESPIMAT 2.5 MCG/ACT AERS Inhale 2 puffs into the lungs daily as needed (shortness of breath).     ? ?Current Facility-Administered Medications  ?Medication Dose Route Frequency Provider Last Rate Last Admin  ? sodium chloride flush (NS) 0.9 % injection 3 mL  3 mL Intravenous Q12H Danetta Prom, Alphonse Guild, MD      ? ? ? ?  Past Surgical History:  ?Procedure Laterality Date  ? ABDOMINAL HYSTERECTOMY  1978  ? APPENDECTOMY    ? BREAST BIOPSY Left 1970s; 2015  ? BREAST LUMPECTOMY Left 1970s  ? BREAST LUMPECTOMY WITH NEEDLE LOCALIZATION Left 07/07/2013  ? Procedure: BREAST LUMPECTOMY WITH NEEDLE LOCALIZATION;  Surgeon: Merrie Roof, MD;  Location: [place];  Service: General;  Laterality: Left;  ? CARDIAC CATHETERIZATION N/A 01/22/2016  ? Procedure: Left Heart Cath and Coronary Angiography;  Surgeon: Peter M Martinique, MD;  Location: [place] [facility];  Service: Cardiovascular;  Laterality: N/A;  ? CARDIAC CATHETERIZATION N/A 01/22/2016  ? Procedure: Coronary Stent Intervention;  Surgeon: Peter M Martinique, MD;  Location: [place] [facility];  Service: Cardiovascular;  Laterality: N/A;  ? CAROTID STENT Left 04/2003  ? small left parietal and left hemispheric CVA/notes 07/29/2010  ? CHOLECYSTECTOMY OPEN    ? COLONOSCOPY  2011  ? COLONOSCOPY N/A 05/10/2019  ? Procedure: COLONOSCOPY;  Surgeon: Rogene Houston, MD;  [facility];  Service: Endoscopy;  Laterality: N/A;  1:45  ? CORONARY ATHERECTOMY N/A 12/29/2019  ? Procedure: CORONARY ATHERECTOMY;  Surgeon: Jettie Booze, MD;  Location: [place] [facility];  Service: Cardiovascular;  Laterality: N/A;  Prox RCA  ? CORONARY STENT INTERVENTION N/A 12/29/2019  ? Procedure: CORONARY STENT INTERVENTION;  Surgeon: Jettie Booze, MD;  Location: [place] [facility];  Service: Cardiovascular;  Laterality: N/A;  Prox RCA  ? ESOPHAGOGASTRODUODENOSCOPY  08/06/2011  ? Procedure: ESOPHAGOGASTRODUODENOSCOPY (EGD);  Surgeon: Rogene Houston, MD;   [facility];  Service: Endoscopy;  Laterality: N/A;  ? ESOPHAGOGASTRODUODENOSCOPY N/A 05/10/2019  ? Procedure: ESOPHAGOGASTRODUODENOSCOPY (EGD);  Surgeon: Rogene Houston, MD;  [facility];  Ser

## 2021-07-01 ENCOUNTER — Other Ambulatory Visit (HOSPITAL_COMMUNITY)
Admission: RE | Admit: 2021-07-01 | Discharge: 2021-07-01 | Disposition: A | Discharge status: Home / Self Care | Payer: Medicare Other | Source: Other Acute Inpatient Hospital | Attending: Family Medicine | Admitting: Family Medicine

## 2021-07-01 DIAGNOSIS — Z79891 Long term (current) use of opiate analgesic: Secondary | ICD-10-CM | POA: Insufficient documentation

## 2021-07-01 LAB — RAPID URINE DRUG SCREEN, HOSP PERFORMED
Amphetamines: NOT DETECTED
Barbiturates: NOT DETECTED
Benzodiazepines: POSITIVE — AB
Cocaine: NOT DETECTED
Opiates: POSITIVE — AB
Tetrahydrocannabinol: NOT DETECTED

## 2021-07-03 ENCOUNTER — Other Ambulatory Visit: Payer: Self-pay | Admitting: Nurse Practitioner

## 2021-07-03 DIAGNOSIS — Z1231 Encounter for screening mammogram for malignant neoplasm of breast: Secondary | ICD-10-CM

## 2021-07-10 ENCOUNTER — Other Ambulatory Visit (INDEPENDENT_AMBULATORY_CARE_PROVIDER_SITE_OTHER): Payer: Self-pay | Admitting: Gastroenterology

## 2021-07-25 ENCOUNTER — Ambulatory Visit: Payer: Medicare Other

## 2021-07-31 ENCOUNTER — Ambulatory Visit: Payer: Medicare Other | Admitting: Cardiology

## 2021-08-04 ENCOUNTER — Other Ambulatory Visit: Payer: Self-pay | Admitting: Cardiology

## 2021-08-05 ENCOUNTER — Telehealth (INDEPENDENT_AMBULATORY_CARE_PROVIDER_SITE_OTHER): Payer: Self-pay | Admitting: Gastroenterology

## 2021-08-05 NOTE — Telephone Encounter (Signed)
Patients case manager called stated patient wants to cancel her swallowing test due to her sons sudden death

## 2021-08-20 ENCOUNTER — Emergency Department (HOSPITAL_COMMUNITY)
Admission: EM | Admit: 2021-08-20 | Discharge: 2021-08-20 | Disposition: A | Discharge status: Home / Self Care | Payer: Medicare Other | Attending: Emergency Medicine | Admitting: Emergency Medicine

## 2021-08-20 ENCOUNTER — Encounter (HOSPITAL_COMMUNITY): Payer: Self-pay | Admitting: Emergency Medicine

## 2021-08-20 ENCOUNTER — Other Ambulatory Visit: Payer: Self-pay

## 2021-08-20 ENCOUNTER — Emergency Department (HOSPITAL_COMMUNITY): Payer: Medicare Other

## 2021-08-20 DIAGNOSIS — W010XXA Fall on same level from slipping, tripping and stumbling without subsequent striking against object, initial encounter: Secondary | ICD-10-CM | POA: Diagnosis not present

## 2021-08-20 DIAGNOSIS — M25562 Pain in left knee: Secondary | ICD-10-CM | POA: Diagnosis not present

## 2021-08-20 DIAGNOSIS — Y92512 Supermarket, store or market as the place of occurrence of the external cause: Secondary | ICD-10-CM | POA: Insufficient documentation

## 2021-08-20 DIAGNOSIS — Z7982 Long term (current) use of aspirin: Secondary | ICD-10-CM | POA: Diagnosis not present

## 2021-08-20 DIAGNOSIS — Z91040 Latex allergy status: Secondary | ICD-10-CM | POA: Insufficient documentation

## 2021-08-20 NOTE — ED Provider Notes (Signed)
[facility] EMERGENCY DEPARTMENT Provider Note   CSN: [id number] Arrival date & time: 08/20/21  1546     History  Chief Complaint  Patient presents with   [name] is a 71 y.o. female.   Fall Pertinent negatives include no headaches.       [name] is a 71 y.o. female who presents to the Emergency Department complaining of left knee pain secondary to mechanical fall that occurred earlier this afternoon.  She states she was grocery shopping when she slipped and fell landing on her left knee and right buttock.  She denied having significant pain at the time of her fall.  She continued to walk and get up her groceries.  She got home put her groceries up and after sitting for several minutes, she noticed worsening pain of her left knee with swelling and was unable to extend her knee without significant pain.  She denies any pain of her lower back, neck, or right buttock.  No head injury or LOC.  She does not take blood thinners.  She has not tried any therapies prior to ER arrival.    Home Medications Prior to Admission medications   Medication Sig Start Date End Date Taking? Authorizing Provider  albuterol (PROVENTIL HFA;VENTOLIN HFA) 108 (90 Base) MCG/ACT inhaler Inhale 2 puffs into the lungs every 6 (six) hours as needed for wheezing or shortness of breath.    [provider]  albuterol (PROVENTIL) (2.5 MG/3ML) 0.083% nebulizer solution Take 2.5 mg by nebulization every 6 (six) hours as needed for wheezing or shortness of breath.    [provider]  amLODipine (NORVASC) 5 MG tablet TAKE 1 TABLET BY MOUTH ONCE DAILY. 03/04/21   Arnoldo Lenis, MD  aspirin EC 81 MG tablet Take 1 tablet (81 mg total) by mouth daily. 05/11/19   Rogene Houston, MD  aspirin-acetaminophen-caffeine (EXCEDRIN MIGRAINE) [phone] MG tablet Take 1 tablet by mouth every 6 (six) hours as needed for headache.    [provider]  atorvastatin (LIPITOR) 80  MG tablet TAKE ONE TABLET BY MOUTH DAILY AT 6 P.M. 08/04/21   Arnoldo Lenis, MD  azaTHIOprine (IMURAN) 50 MG tablet TAKE 1 AND 1/2 TABLETS BY MOUTH ONCE DAILY. 05/06/21   Harvel Quale, MD  carvedilol (COREG) 3.125 MG tablet TAEK 1 TABLET BY MOUTH TWICE DAILY WITH MEALS. 08/05/20   Arnoldo Lenis, MD  cetirizine (ZYRTEC) 10 MG tablet Take 10 mg by mouth daily.    [provider]  Cholecalciferol (VITAMIN D3) 1.25 MG (50000 UT) CAPS Take 1 capsule by mouth once a week. 04/14/21   [provider]  clopidogrel (PLAVIX) 75 MG tablet TAKE (1) TABLET BY MOUTH ONCE DAILY. 12/10/20   Arnoldo Lenis, MD  clorazepate (TRANXENE) 7.5 MG tablet Take 7.5 mg by mouth 3 (three) times daily as needed for anxiety.  01/03/16   [provider]  docusate sodium (COLACE) 100 MG capsule Take 200 mg by mouth at bedtime.    [provider]  famotidine (PEPCID) 20 MG tablet Take 1 tablet (20 mg total) by mouth at bedtime. 10/09/19   Ezzard Standing, PA-C  furosemide (LASIX) 40 MG tablet Take 40 mg by mouth daily as needed for fluid (leg/hand swelling.).    [provider]  glipiZIDE (GLUCOTROL) 5 MG tablet Take 1 tablet (5 mg total) by mouth daily before breakfast. 04/20/18 12/21/21  Barton Dubois, MD  HYDROcodone-acetaminophen [facility]) 10-325 MG  tablet Take 1 tablet by mouth 3 (three) times daily as needed for moderate pain.     [provider]  isosorbide mononitrate (IMDUR) 120 MG 24 hr tablet Take 1 tablet (120 mg total) by mouth daily. 02/20/21   Arnoldo Lenis, MD  JARDIANCE 10 MG TABS tablet Take 10 mg by mouth every morning. 05/13/21   [provider]  Ketotifen Fumarate (ALLERGY EYE DROPS OP) Place 1 drop into both eyes daily as needed (allergies).    [provider]  levothyroxine (SYNTHROID, LEVOTHROID) 50 MCG tablet Take 50 mcg by mouth daily before breakfast.  07/20/14   [provider]  LORazepam (ATIVAN) 0.5 MG  tablet Take 1 tablet (0.5 mg total) by mouth every 8 (eight) hours. Take one pill 30 minutes prior to your MRI 10/21/20   Montez Morita, Quillian Quince, MD  losartan (COZAAR) 50 MG tablet TAKE ONE TABLET BY MOUTH ONCE DAILY. 06/20/20   Arnoldo Lenis, MD  nitroGLYCERIN (NITROSTAT) 0.4 MG SL tablet Place 0.4 mg under the tongue every 5 (five) minutes as needed for chest pain.      [provider]  ondansetron (ZOFRAN) 4 MG tablet Take 1 tablet (4 mg total) by mouth 3 (three) times daily as needed. 10/09/19   Laurine Blazer B, PA-C  pantoprazole (PROTONIX) 40 MG tablet TAKE 1 TABLET BY MOUTH ONCE A DAY. 07/10/21   Montez Morita, Quillian Quince, MD  SPIRIVA RESPIMAT 2.5 MCG/ACT AERS Inhale 2 puffs into the lungs daily as needed (shortness of breath).  04/01/18   [provider]      Allergies    Gabapentin, Prednisone, Tape, and Latex    Review of Systems   Review of Systems  Constitutional:  Negative for fever.  Eyes:  Negative for visual disturbance.  Musculoskeletal:  Positive for arthralgias (Left knee pain and swelling). Negative for back pain and neck pain.  Neurological:  Negative for dizziness, syncope, weakness, numbness and headaches.   Physical Exam Updated Vital Signs BP 136/78 (BP Location: Right Arm)   Pulse 79   Temp 97.9 F (36.6 C) (Oral)   Resp 16   Ht '5\' 3"'$  (1.6 m)   Wt 84.4 kg   SpO2 94%   BMI 32.95 kg/m  Physical Exam Vitals and nursing note reviewed.  Constitutional:      General: She is not in acute distress.    Appearance: Normal appearance. She is not toxic-appearing.  HENT:     Head: Atraumatic.  Cardiovascular:     Rate and Rhythm: Normal rate and regular rhythm.     Pulses: Normal pulses.  Pulmonary:     Effort: Pulmonary effort is normal.  Abdominal:     Palpations: Abdomen is soft.     Tenderness: There is no abdominal tenderness.  Musculoskeletal:        General: Swelling, tenderness and signs of injury present. No deformity.      Cervical back: Normal range of motion. No tenderness.     Left knee: Swelling present. No erythema or crepitus. Decreased range of motion. Tenderness present over the ACL. Normal pulse.     Instability Tests: Anterior drawer test negative. Posterior drawer test negative.     Right lower leg: No edema.     Left lower leg: No swelling.  Neurological:     Mental Status: She is alert.    ED Results / Procedures / Treatments   Labs (all labs ordered are listed, but only abnormal results are displayed) Labs  Reviewed - No data to display  EKG None  Radiology DG Knee Complete 4 Views Left  Result Date: 08/20/2021 CLINICAL DATA:  Fall, pain, initial encounter. EXAM: LEFT KNEE - COMPLETE 4+ VIEW COMPARISON:  None Available. FINDINGS: No acute osseous or joint abnormality. IMPRESSION: No acute osseous or joint abnormality. Electronically Signed   By: Lorin Picket M.D.   On: 08/20/2021 16:31    Procedures Procedures    Medications Ordered in ED Medications - No data to display  ED Course/ Medical Decision Making/ A&P                           Medical Decision Making Amount and/or Complexity of Data Reviewed Radiology: ordered.   Patient here for evaluation of left knee pain and swelling secondary to mechanical fall.  Injury occurred several hours ago.  Fell onto her left knee.  Continued to stand and walk and developed gradually worsening pain after sitting.  Now having pain to the anterior knee.  On exam, patient has some mild soft tissue swelling of the knee.  No palpable effusion.  She is tender along the anterior knee.  No high riding patella on exam.   No significant patellar tenderness or obvious ligamentous instability.    X-ray of the left knee without evidence of bony abnormality.    Knee sleeve applied, pt agreeable to ice packs and close orthopedic f/u.  She prefers to f/u locally with Dr. Frances Furbish, offered prescription for pain medication, but prefers to take tylenol for  pain.  She has a walker at home and agrees to use          Final Clinical Impression(s) / ED Diagnoses Final diagnoses:  Acute pain of left knee    Rx / DC Orders ED Discharge Orders     None         Kem Parkinson, PA-C 08/20/21 1911    Daleen Bo, MD 08/21/21 1221

## 2021-08-20 NOTE — ED Triage Notes (Signed)
Pt fell in parking lot at [facility] this afternoon injuring left knee, denies LOC.

## 2021-08-20 NOTE — Discharge Instructions (Signed)
Apply ice packs on and off to your knee to help with swelling.  Tylenol every 4 hours if needed for pain.  You may wear the knee sleeve as needed for support when walking or standing.  You may remove for bathing and at bedtime.  Call Dr. Dreama Saa office to arrange a follow-up appointment if your symptoms are not improving in 1 week.

## 2021-08-21 ENCOUNTER — Encounter (INDEPENDENT_AMBULATORY_CARE_PROVIDER_SITE_OTHER): Payer: Self-pay | Admitting: Gastroenterology

## 2021-08-21 ENCOUNTER — Ambulatory Visit (INDEPENDENT_AMBULATORY_CARE_PROVIDER_SITE_OTHER): Payer: Medicare Other | Admitting: Gastroenterology

## 2021-08-29 ENCOUNTER — Ambulatory Visit: Payer: Medicare Other | Admitting: Orthopedic Surgery

## 2021-09-03 ENCOUNTER — Other Ambulatory Visit (INDEPENDENT_AMBULATORY_CARE_PROVIDER_SITE_OTHER): Payer: Self-pay | Admitting: Gastroenterology

## 2021-09-03 DIAGNOSIS — K754 Autoimmune hepatitis: Secondary | ICD-10-CM

## 2021-09-03 NOTE — Telephone Encounter (Signed)
Patient canceled last appointment, needs appointment for further refills

## 2021-09-08 ENCOUNTER — Other Ambulatory Visit: Payer: Self-pay | Admitting: Cardiology

## 2021-09-09 ENCOUNTER — Other Ambulatory Visit: Payer: Self-pay | Admitting: Cardiology

## 2021-09-10 ENCOUNTER — Other Ambulatory Visit: Payer: Self-pay | Admitting: Cardiology

## 2021-10-20 ENCOUNTER — Other Ambulatory Visit (INDEPENDENT_AMBULATORY_CARE_PROVIDER_SITE_OTHER): Payer: Self-pay | Admitting: Gastroenterology

## 2021-10-23 ENCOUNTER — Other Ambulatory Visit (INDEPENDENT_AMBULATORY_CARE_PROVIDER_SITE_OTHER): Payer: Self-pay

## 2021-10-23 DIAGNOSIS — R1319 Other dysphagia: Secondary | ICD-10-CM

## 2021-10-23 DIAGNOSIS — K219 Gastro-esophageal reflux disease without esophagitis: Secondary | ICD-10-CM

## 2021-10-23 MED ORDER — PANTOPRAZOLE SODIUM 40 MG PO TBEC: 40.0000 mg | DELAYED_RELEASE_TABLET | Freq: Every day | ORAL | 0 refills | Status: DC

## 2021-11-03 ENCOUNTER — Other Ambulatory Visit: Payer: Self-pay | Admitting: Cardiology

## 2021-11-03 ENCOUNTER — Other Ambulatory Visit (INDEPENDENT_AMBULATORY_CARE_PROVIDER_SITE_OTHER): Payer: Self-pay | Admitting: Gastroenterology

## 2021-11-03 DIAGNOSIS — K754 Autoimmune hepatitis: Secondary | ICD-10-CM

## 2021-11-12 ENCOUNTER — Telehealth: Payer: Self-pay | Admitting: *Deleted

## 2021-11-12 ENCOUNTER — Other Ambulatory Visit: Payer: Self-pay | Admitting: *Deleted

## 2021-11-12 ENCOUNTER — Other Ambulatory Visit (INDEPENDENT_AMBULATORY_CARE_PROVIDER_SITE_OTHER): Payer: Self-pay

## 2021-11-12 DIAGNOSIS — K754 Autoimmune hepatitis: Secondary | ICD-10-CM

## 2021-11-12 DIAGNOSIS — K7460 Unspecified cirrhosis of liver: Secondary | ICD-10-CM

## 2021-11-12 DIAGNOSIS — R140 Abdominal distension (gaseous): Secondary | ICD-10-CM

## 2021-11-12 NOTE — Telephone Encounter (Signed)
Oh wow. Please ask her to follow up in her appointment in September. I will resend the azathioprine to [facility] today.  She will need to check with them tomorrow if the medication is there.  Abigail Butts, can you please send her an order for repeat CMP and IgG ?  As she has presented worsening distention of her abdomen, we will schedule her for a liver Doppler. Darius Bump, can you please schedule a liver Doppler? Dx: Worsening abdominal distention, history of autoimmune hepatitis.   Thanks,  [name], MD Gastroenterology and Hepatology [facility]

## 2021-11-12 NOTE — Telephone Encounter (Signed)
Patient aware to pick up med at [facility], to [facility] and that she will now need appt at lab before going, and she is aware of her ultrasound appointment. I left a message with pt's social worker [name] to return call so I can discuss with her as well.

## 2021-11-12 NOTE — Telephone Encounter (Signed)
Patient's social worker [name] called for patient. Patient told her [name] did not send her med for azathioprine. Patient thought it was discontinued. I see it on current med list. Patient last seen 10/21/20 and social worker reports she did have appt earlier but they had to change to 12/04/21 due to her son passing away unexpectly. She reports her stomach swelling since being off of med. She is not sure how long she has been off of med. She had a refill sent in 09/03/21 and one additional so she would of ran out around 11/03/21.

## 2021-11-12 NOTE — Telephone Encounter (Signed)
Thanks

## 2021-11-13 ENCOUNTER — Other Ambulatory Visit (INDEPENDENT_AMBULATORY_CARE_PROVIDER_SITE_OTHER): Payer: Self-pay | Admitting: Gastroenterology

## 2021-11-13 DIAGNOSIS — K754 Autoimmune hepatitis: Secondary | ICD-10-CM

## 2021-11-13 NOTE — Telephone Encounter (Signed)
I called and discussed with social worker [name] as well as with pt. Notified to pick up rx at pharmacy and restart. Gave ultrasound appt and gave information on how to make appt at [facility] for lab work. They both verbalized understanding of all.

## 2021-11-24 ENCOUNTER — Ambulatory Visit (HOSPITAL_COMMUNITY)
Admission: RE | Admit: 2021-11-24 | Discharge: 2021-11-24 | Disposition: A | Discharge status: Home / Self Care | Payer: Medicare Other | Source: Ambulatory Visit | Attending: Gastroenterology | Admitting: Gastroenterology

## 2021-11-24 DIAGNOSIS — R140 Abdominal distension (gaseous): Secondary | ICD-10-CM | POA: Insufficient documentation

## 2021-11-24 DIAGNOSIS — K7460 Unspecified cirrhosis of liver: Secondary | ICD-10-CM | POA: Insufficient documentation

## 2021-11-24 DIAGNOSIS — K754 Autoimmune hepatitis: Secondary | ICD-10-CM | POA: Insufficient documentation

## 2021-11-25 LAB — COMPREHENSIVE METABOLIC PANEL
AG Ratio: 1.5 (calc) (ref 1.0–2.5)
ALT: 13 U/L (ref 6–29)
AST: 18 U/L (ref 10–35)
Albumin: 4.3 g/dL (ref 3.6–5.1)
Alkaline phosphatase (APISO): 69 U/L (ref 37–153)
BUN: 10 mg/dL (ref 7–25)
CO2: 31 mmol/L (ref 20–32)
Calcium: 10.1 mg/dL (ref 8.6–10.4)
Chloride: 102 mmol/L (ref 98–110)
Creat: 0.81 mg/dL (ref 0.60–1.00)
Globulin: 2.9 g/dL (calc) (ref 1.9–3.7)
Glucose, Bld: 98 mg/dL (ref 65–99)
Potassium: 4.5 mmol/L (ref 3.5–5.3)
Sodium: 141 mmol/L (ref 135–146)
Total Bilirubin: 0.4 mg/dL (ref 0.2–1.2)
Total Protein: 7.2 g/dL (ref 6.1–8.1)

## 2021-11-25 LAB — IGG: IgG (Immunoglobin G), Serum: 1147 mg/dL (ref 600–1540)

## 2021-12-04 ENCOUNTER — Ambulatory Visit (INDEPENDENT_AMBULATORY_CARE_PROVIDER_SITE_OTHER): Payer: Medicare Other | Admitting: Gastroenterology

## 2021-12-04 ENCOUNTER — Encounter (INDEPENDENT_AMBULATORY_CARE_PROVIDER_SITE_OTHER): Payer: Self-pay | Admitting: Gastroenterology

## 2021-12-04 VITALS — BP 158/84 | HR 80 | Temp 98.0°F | Ht 63.0 in | Wt 174.7 lb

## 2021-12-04 DIAGNOSIS — K838 Other specified diseases of biliary tract: Secondary | ICD-10-CM | POA: Diagnosis not present

## 2021-12-04 DIAGNOSIS — K219 Gastro-esophageal reflux disease without esophagitis: Secondary | ICD-10-CM

## 2021-12-04 DIAGNOSIS — K754 Autoimmune hepatitis: Secondary | ICD-10-CM | POA: Diagnosis not present

## 2021-12-04 DIAGNOSIS — R1312 Dysphagia, oropharyngeal phase: Secondary | ICD-10-CM

## 2021-12-04 MED ORDER — LORAZEPAM 0.5 MG PO TABS: 0.5000 mg | ORAL_TABLET | Freq: Once | ORAL | 0 refills | Status: AC

## 2021-12-04 NOTE — Patient Instructions (Signed)
It was nice to meet you! Please continue protonix '40mg'$  once daily Continue asathioprine '50mg'$  daily We will update liver US to look for cirrhosis as well as MRI to look at the bile duct that was previously elevated ( I have sent one tablet of anti anxiety medication for you to take prior to this study) We will also get you scheduled for swallowing test with speech therapy  Follow up 6 months

## 2021-12-04 NOTE — Progress Notes (Signed)
Referring Provider: Lemmie Evens, MD Primary Care Physician:  Lemmie Evens, MD Primary GI Physician:castaneda   Chief Complaint  Patient presents with   autoimmune hepatitis    Patient arrives with case worker Sharyn Lull. Follow up on autoimmune hepatitis. Has concerns about choking. Happens about twice a day. Reports she has had polyps on her vocal cords in the past. Worried she will pass out when she is choking. Reports she has had a swallowing test about one year ago and was told she was losing muscle in her esophagus.    HPI:   [name] is a 71 y.o. 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   Last seen 10/2020, at that time, feeling well, taking AZA 46m daily, tolerating medication well, having some nausea in the mornings and vomiting but not daily, taking zofran with good results. Reports some bloating.   Recommended continued AZA 756mdaily until LFTs stable x6 months then consider dropping dose to 5066maily, update elastography, MELD labs and IgG. Also scheduled for MRCP due to CBD dilation on previous abdominal imaging. SLP evaluation due to ongoing dysphagia.  ASA was decreased to 17m30mily on 11/13/21 Last LFTs and IgG 11/24/21 WNL with IgG 1147 Liver doppler 11/24/21 was normal  MRCP, MBSS and elastography were not completed   Present:  Patient reports she continues to have issues with swallowing, will get choked on foods, liquids, saliva. This occurs almost daily. Sometimes foods feel that they get stuck in lower chest. She states that when she gets choked on something it takes her breath and causes coughing. She denies any  acid regurgitation. Endorses previous surgery on her vocal cords in the 1990s. She is doing well on pantoprazole 40mg31me daily without breakthrough symptoms.    She continues to have some nausea in the morning and will sometimes vomit up some liquid. This occurs maybe twice per week. She takes zofran as needed with good results.   Denies episodes of confusion, jaundice pruritus or swelling to abdomen.  She was unable to complete previously ordered diagnostics as her son passed away.   Patient denies melena, hematochezia, diarrhea, constipation,odynophagia, early satiety or weight loss.   Last EGD: 05/10/2019 no presence of alterations explain dysphagia, he was empirically dilated with a [place] up to 54 [place] presence of any mucosal disruption, normal stomach and duodenum. Last Colonoscopy:05/10/2019, 7 polyps were found in the descending, transverse and ascending colon, which were completely removed, diverticulosis was found in the colon, external hemorrhoids were found.  Pathology showed multiple tubular adenomas.   Past Medical History:  Diagnosis Date   Anxiety    Arthritis    "hands, arms, back, neck, knees, fingers" (01/21/2016)   Atrial fibrillation (HCC)    ASA daiy; came off Coumadin shortly after stroke   Breast cancer, left breast (HCC) [place]; 2015   Cancer of skin of leg    BLE   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HCC)    left   Chronic lower back pain    Chronic pain    COPD (chronic obstructive pulmonary disease) (HCC)    Coronary artery disease    CVA (cerebral vascular accident) (HCC) [place] right brain CVA   Right brain secondary to right internal carotid  artery dissection for which 2 stents were placed;F/u cerebral angiography in 05/2008-minimal plaque; healing of previously identified left internal carotid artery dissection   Depression    GERD (gastroesophageal reflux disease)    Heart murmur    Hepatic cirrhosis ([place]) 12/20/2017   Hepatitis    History of  blood transfusion    "related to spleen"   History of hiatal hernia    History of kidney stones    Hyperlipidemia    Hypertension    Hypothyroidism    Kidney stone    Melanoma of forearm, left ([address])    Migraine    "a few migraines/year" (01/21/2016)   NSTEMI (non-ST elevated myocardial infarction) ([place]) 01/21/2016   Archie Endo 01/21/2016   Obesity    Stroke ([place]) 04/2003   2 carotid artery stents in place; small left parietal and left hemispheric CVA.Phillis Knack 01/21/2016   Tobacco use disorder    50 pack years; questionably discontinued in 2010   Wears glasses     Past Surgical History:  Procedure Laterality Date   ABDOMINAL HYSTERECTOMY  1978   APPENDECTOMY     BREAST BIOPSY Left 1970s; 2015   BREAST LUMPECTOMY Left 1970s   BREAST LUMPECTOMY WITH NEEDLE LOCALIZATION Left 07/07/2013   Procedure: BREAST LUMPECTOMY WITH NEEDLE LOCALIZATION;  Surgeon: Merrie Roof, MD;  Location: [facility];  Service: General;  Laterality: Left;   CARDIAC CATHETERIZATION N/A 01/22/2016   Procedure: Left Heart Cath and Coronary Angiography;  Surgeon: Peter M Martinique, MD;  Location: [facility];  Service: Cardiovascular;  Laterality: N/A;   CARDIAC CATHETERIZATION N/A 01/22/2016   Procedure: Coronary Stent Intervention;  Surgeon: Peter M Martinique, MD;  Location: [place] [facility];  Service: Cardiovascular;  Laterality: N/A;   CAROTID STENT Left 04/2003   small left parietal and left hemispheric CVA/notes 07/29/2010   CHOLECYSTECTOMY OPEN     COLONOSCOPY  2011   COLONOSCOPY N/A 05/10/2019   Procedure: COLONOSCOPY;  Surgeon: Rogene Houston, MD;  [facility];  Service: Endoscopy;  Laterality: N/A;  1:45   CORONARY ATHERECTOMY N/A 12/29/2019   Procedure: CORONARY ATHERECTOMY;  Surgeon: Jettie Booze, MD;  Location: [facility];  Service: Cardiovascular;  Laterality: N/A;  Prox RCA   CORONARY STENT INTERVENTION N/A 12/29/2019   Procedure: CORONARY STENT INTERVENTION;   Surgeon: Jettie Booze, MD;  Location: [facility];  Service: Cardiovascular;  Laterality: N/A;  Prox RCA   ESOPHAGOGASTRODUODENOSCOPY  08/06/2011   Procedure: ESOPHAGOGASTRODUODENOSCOPY (EGD);  Surgeon: Rogene Houston, MD;  [facility];  Service: Endoscopy;  Laterality: N/A;   ESOPHAGOGASTRODUODENOSCOPY N/A 05/10/2019   Procedure: ESOPHAGOGASTRODUODENOSCOPY (EGD);  Surgeon: Rogene Houston, MD;  [facility];  Service: Endoscopy;  Laterality: N/A;   FLEXIBLE SIGMOIDOSCOPY  08/06/2011   Procedure: FLEXIBLE SIGMOIDOSCOPY;  Surgeon: Rogene Houston, MD;  [facility];  Service: Endoscopy;  Laterality: N/A;   INGUINAL HERNIA REPAIR Left 1970s   INTRAVASCULAR ULTRASOUND/IVUS N/A 12/29/2019   Procedure: Intravascular Ultrasound/IVUS;  Surgeon: Jettie Booze, MD;  Location: [facility];  Service: Cardiovascular;  Laterality: N/A;   IR ANGIO INTRA EXTRACRAN SEL COM CAROTID INNOMINATE BILAT MOD SED  11/01/2020   IR ANGIO VERTEBRAL SEL VERTEBRAL BILAT MOD SED  11/01/2020   IR RADIOLOGIST EVAL & MGMT  09/23/2020   IR US GUIDE VASC ACCESS RIGHT  11/01/2020   LARYNX SURGERY  1970s   Polyps excised   LEFT  HEART CATH AND CORONARY ANGIOGRAPHY N/A 12/29/2019   Procedure: LEFT HEART CATH AND CORONARY ANGIOGRAPHY;  Surgeon: Jettie Booze, MD;  Location: [facility];  Service: Cardiovascular;  Laterality: N/A;   MALONEY DILATION  05/10/2019   Procedure: Venia Minks DILATION;  Surgeon: Rogene Houston, MD;  [facility];  Service: Endoscopy;;   MELANOMA EXCISION Left ~ 2016   forearm   POLYPECTOMY  05/10/2019   Procedure: POLYPECTOMY;  Surgeon: Rogene Houston, MD;  [facility];  Service: Endoscopy;;  colon   SPLENECTOMY, 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   spontaneous rupture   TEMPORARY PACEMAKER N/A 12/29/2019   Procedure: TEMPORARY PACEMAKER;  Surgeon: Jettie Booze, MD;  Location: [facility];  Service: Cardiovascular;   Laterality: N/A;    Current Outpatient Medications  Medication Sig Dispense Refill   albuterol (PROVENTIL HFA;VENTOLIN HFA) 108 (90 Base) MCG/ACT inhaler Inhale 2 puffs into the lungs every 6 (six) hours as needed for wheezing or shortness of breath.     albuterol (PROVENTIL) (2.5 MG/3ML) 0.083% nebulizer solution Take 2.5 mg by nebulization every 6 (six) hours as needed for wheezing or shortness of breath.     amLODipine (NORVASC) 5 MG tablet TAKE 1 TABLET BY MOUTH ONCE DAILY. 90 tablet 2   aspirin EC 81 MG tablet Take 1 tablet (81 mg total) by mouth daily.     aspirin-acetaminophen-caffeine (EXCEDRIN MIGRAINE) 250-250-65 MG tablet Take 1 tablet by mouth every 6 (six) hours as needed for headache.     atorvastatin (LIPITOR) 80 MG tablet TAKE ONE TABLET BY MOUTH DAILY AT 6 P.M. 90 tablet 3   azaTHIOprine (IMURAN) 50 MG tablet TAKE 1 AND 1/2 TABLETS BY MOUTH ONCE DAILY. 45 tablet 0   carvedilol (COREG) 3.125 MG tablet TAEK 1 TABLET BY MOUTH TWICE DAILY WITH MEALS. 180 tablet 0   cetirizine (ZYRTEC) 10 MG tablet Take 10 mg by mouth daily.     Cholecalciferol (VITAMIN D3) 1.25 MG (50000 UT) CAPS Take 1 capsule by mouth once a week.     clopidogrel (PLAVIX) 75 MG tablet TAKE (1) TABLET BY MOUTH ONCE DAILY. 90 tablet 3   clorazepate (TRANXENE) 7.5 MG tablet Take 7.5 mg by mouth 3 (three) times daily as needed for anxiety.      docusate sodium (COLACE) 100 MG capsule Take 200 mg by mouth at bedtime.     famotidine (PEPCID) 20 MG tablet Take 1 tablet (20 mg total) by mouth at bedtime. 30 tablet 11   furosemide (LASIX) 40 MG tablet Take 40 mg by mouth daily as needed for fluid (leg/hand swelling.).     glipiZIDE (GLUCOTROL) 5 MG tablet Take 1 tablet (5 mg total) by mouth daily before breakfast. 30 tablet 3   HYDROcodone-acetaminophen (NORCO) 10-325 MG tablet Take 1 tablet by mouth 3 (three) times daily as needed for moderate pain.      isosorbide mononitrate (IMDUR) 120 MG 24 hr tablet TAKE ONE TABLET  BY MOUTH ONCE DAILY. 90 tablet 2   JARDIANCE 10 MG TABS tablet Take 10 mg by mouth every morning.     Ketotifen Fumarate (ALLERGY EYE DROPS OP) Place 1 drop into both eyes daily as needed (allergies).     levothyroxine (SYNTHROID, LEVOTHROID) 50 MCG tablet Take 50 mcg by mouth daily before breakfast.      LORazepam (ATIVAN) 0.5 MG tablet Take 1 tablet (0.5 mg total) by mouth every 8 (eight) hours. Take one pill 30 minutes prior to your  MRI 1 tablet 0   losartan (COZAAR) 50 MG tablet TAKE ONE TABLET BY MOUTH ONCE DAILY. 90 tablet 2   nitroGLYCERIN (NITROSTAT) 0.4 MG SL tablet Place 0.4 mg under the tongue every 5 (five) minutes as needed for chest pain.       ondansetron (ZOFRAN) 4 MG tablet Take 1 tablet (4 mg total) by mouth 3 (three) times daily as needed. 30 tablet 1   pantoprazole (PROTONIX) 40 MG tablet Take 1 tablet (40 mg total) by mouth daily. 90 tablet 0   SPIRIVA RESPIMAT 2.5 MCG/ACT AERS Inhale 2 puffs into the lungs daily as needed (shortness of breath).      Current Facility-Administered Medications  Medication Dose Route Frequency Provider Last Rate Last Admin   sodium chloride flush (NS) 0.9 % injection 3 mL  3 mL Intravenous Q12H Arnoldo Lenis, MD        Allergies as of 12/04/2021 - Review Complete 12/04/2021  Allergen Reaction Noted   Gabapentin Nausea And Vomiting 03/22/2019   Prednisone Palpitations 03/22/2019   Tape Other (See Comments) 12/12/2010   Latex Rash and Other (See Comments) 07/26/2012    Family History  Problem Relation Age of Onset   Heart failure Mother    Cancer Father    Heart failure Father    Cancer Sister    Dementia Sister    Heart disease Other    Arthritis Other    Cancer Other    Diabetes Other    Kidney disease Other    Cancer Sister    Heart failure Brother     Social History   Socioeconomic History   Marital status: Divorced    Spouse name: Not on file   Number of children: 2   Years of education: Not on file   Highest  education level: Not on file  Occupational History   Occupation: Disabled   Occupation: retired  Tobacco Use   Smoking status: Every Day    Packs/day: 1.00    Years: 40.00    Total pack years: 40.00    Types: Cigarettes    Last attempt to quit: 07/06/2007    Years since quitting: 14.4    Passive exposure: Current   Smokeless tobacco: Never  Vaping Use   Vaping Use: Never used  Substance and Sexual Activity   Alcohol use: No    Alcohol/week: 0.0 standard drinks of alcohol   Drug use: No   Sexual activity: Not Currently  Other Topics Concern   Not on file  Social History Narrative   ** Merged History Encounter **       Social Determinants of Health   Financial Resource Strain: Not on file  Food Insecurity: Not on file  Transportation Needs: Not on file  Physical Activity: Not on file  Stress: Not on file  Social Connections: Not on file   Review of systems General: negative for malaise, night sweats, fever, chills, weight loss Neck: Negative for lumps, goiter, pain and significant neck swelling Resp: Negative for cough, wheezing, dyspnea at rest CV: Negative for chest pain, leg swelling, palpitations, orthopnea GI: denies melena, hematochezia, diarrhea, constipation, odyonophagia, early satiety or unintentional weight loss. +nausea +vomiting +dysphagia MSK: Negative for joint pain or swelling, back pain, and muscle pain. Derm: Negative for itching or rash Psych: Denies depression, anxiety, memory loss, confusion. No homicidal or suicidal ideation.  Heme: Negative for prolonged bleeding, bruising easily, and swollen nodes. Endocrine: Negative for cold or heat intolerance, polyuria, polydipsia and goiter.  Neuro: negative for tremor, gait imbalance, syncope and seizures. The remainder of the review of systems is noncontributory.  Physical Exam: BP (!) 158/84 (BP Location: Left Arm, Patient Position: Sitting, Cuff Size: Large)   Pulse 80   Temp 98 F (36.7 C) (Oral)    Ht '5\' 3"'  (1.6 m)   Wt 174 lb 11.2 oz (79.2 kg)   BMI 30.95 kg/m  General:   Alert and oriented. No distress noted. Pleasant and cooperative.  Head:  Normocephalic and atraumatic. Eyes:  Conjuctiva clear without scleral icterus. Mouth:  Oral mucosa pink and moist. Good dentition. No lesions. Heart: Normal rate and rhythm, s1 and s2 heart sounds present.  Lungs: Clear lung sounds in all lobes. Respirations equal and unlabored. Abdomen:  +BS, soft, non-tender and non-distended. No rebound or guarding. No HSM or masses noted. Derm: No palmar erythema or jaundice Msk:  Symmetrical without gross deformities. Normal posture. Extremities:  Without edema. Neurologic:  Alert and  oriented x4 Psych:  Alert and cooperative. Normal mood and affect.  Invalid input(s): "6 MONTHS"   ASSESSMENT: KAITLAN BIN is a 71 y.o. female presenting today for follow up of autoimmune hepatitis and continued dysphagia.  AIH: last IgG 1147 with normal LFTs on 11/24/21,  AZA was decreased from 73m to 563mdaily in august. Liver doppler last week without abnormalities. Liver biopsy in 2019 with F3 fibrosis, no recent elastography done, recommend proceeding with USKoreaiver elastography to evaluate for worsening liver fibrosis/presence of cirrhosis.  Notably CBD dilation on previous abdominal imaging of up to 1861mpreviously scheduled for MRCP for further evaluation as this could be related to cholecystectomy status, however we ultimately cannot rule out PBC/PSC, though less likely given bilirubin and Alk Phos have remained normal, recommend proceeding with MRCP for further evaluation of CBD dilation.  Patient continues with dysphagia, last EGD in 2021 without esophageal abnormality to explain her symptoms, previous barium pill esophagram in 2021 with some age related dysmotility and laryngeal penetration with mild vallecular residuals, w/o aspiration, recommended for esophageal manometry at that time though patient has  not been amenable to proceeding with this. Will schedule MBSS with SLP evaluation.  GERD well controlled with pantoprazole 79m37mily, will continue with current PPI regimen.   PLAN:  Continue AZA 50mg61mly  2. US LiKorear elastography  3.  MBSS with SLP eval 4. Continue protonix 79mg 70my  5. MRCP w wo for CBD dilation  All questions were answered, patient verbalized understanding and is in agreement with plan as outlined above.   Follow Up: 6 months   Wanya Bangura L. CarlanAlver Sorrow APRN, AGNP-C Adult-Gerontology Nurse Practitioner [facility]

## 2021-12-09 ENCOUNTER — Other Ambulatory Visit (HOSPITAL_COMMUNITY): Payer: Self-pay | Admitting: Specialist

## 2021-12-09 DIAGNOSIS — I8511 Secondary esophageal varices with bleeding: Secondary | ICD-10-CM

## 2021-12-09 DIAGNOSIS — R1312 Dysphagia, oropharyngeal phase: Secondary | ICD-10-CM

## 2021-12-10 ENCOUNTER — Other Ambulatory Visit (INDEPENDENT_AMBULATORY_CARE_PROVIDER_SITE_OTHER): Payer: Self-pay | Admitting: Gastroenterology

## 2021-12-10 DIAGNOSIS — K754 Autoimmune hepatitis: Secondary | ICD-10-CM

## 2021-12-10 NOTE — Telephone Encounter (Signed)
Last seen by [facility] on 12/04/21. Her note states Recommended continued AZA '75mg'$  daily until LFTs stable x6 months then consider dropping dose to '50mg'$  daily

## 2021-12-10 NOTE — Telephone Encounter (Signed)
Looks like Patient is taking AZA 50 mg daily per Chelsea's last note. Will refill for 50 mg daily.

## 2021-12-11 ENCOUNTER — Other Ambulatory Visit: Payer: Self-pay | Admitting: Cardiology

## 2021-12-12 ENCOUNTER — Encounter: Payer: Self-pay | Admitting: Cardiology

## 2021-12-12 ENCOUNTER — Ambulatory Visit: Payer: Medicare Other | Attending: Cardiology | Admitting: Cardiology

## 2021-12-12 VITALS — BP 106/66 | HR 74 | Ht 63.0 in | Wt 175.8 lb

## 2021-12-12 DIAGNOSIS — I5032 Chronic diastolic (congestive) heart failure: Secondary | ICD-10-CM | POA: Diagnosis not present

## 2021-12-12 DIAGNOSIS — E782 Mixed hyperlipidemia: Secondary | ICD-10-CM

## 2021-12-12 DIAGNOSIS — I2510 Atherosclerotic heart disease of native coronary artery without angina pectoris: Secondary | ICD-10-CM

## 2021-12-12 DIAGNOSIS — I10 Essential (primary) hypertension: Secondary | ICD-10-CM | POA: Diagnosis not present

## 2021-12-12 MED ORDER — LOSARTAN POTASSIUM 25 MG PO TABS: 25.0000 mg | ORAL_TABLET | Freq: Every day | ORAL | 6 refills | Status: DC

## 2021-12-12 NOTE — Progress Notes (Signed)
Clinical Summary Ms. Wahlstrom is a 71 y.o.female seen today for follow up of the following medical problems.    1. CAD - admit 01/2016 with NSTEMI. Full cath report below. Received DES to OM. Unsuccesful PCI to RCA with wire induced dissection, managed medically. The OM was thought to be the culprit. If recurring symptoms can consider repeat RCA intervention. .      12/2019 cath DES to RCA. RCA was prone to vasospasm during procedure   08/2020 nuclear stress: breast attenuatin vs mild anterior ischemia, low risk  -recent severe stress related to passing of her son in May. Her dog of 12 years also recentl passed.  - high stressful moments has led to some some recent arm pain, better with NG      2. COPD - 04/2018 admission with COPD exacerbation       3. Chronic diastolic HF  - nor ecent edema.    4. HTN - had some bilateral LE edema 12/2019, norvasc was lowered to '5mg'$  daily.  -compliant with meds   4. Bradycardia - admit 08/2016 with bradycardia - resolved on lower dose of coreg  5. Dizziness/falls - 51monthwith standing or activity can suddenly feel off balance, head feels like she is in tunnel. Sits down and feels better - staying well hydrated.    6. [facility] reports history of stents at the time . Followed by Dr DEstanislado Pandy 10/2020 monitoring cerebral angiogram.      7. Hyperlipedemia   Jan 2022 TC 128 TG 115 HDL 38 LDL 69 - compliant with statin  Jan 2023 TC 143 TG 110 LDL 85 - has had more recnet labs with pcp   8. 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 Son passed Jul 23 2021 Past Medical History:  Diagnosis Date   Anxiety    Arthritis    "hands, arms, back, neck, knees, fingers" (01/21/2016)   Atrial fibrillation (HCC)    ASA daiy; came off Coumadin shortly after stroke   Bre[place], [place]  breast [place]; 2015   Cancer of skin of leg    BLE   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HCC)    left   Chronic lower back pain    Chronic pain    COPD (chronic obstructive pulmonary disease) (HCC)    Coronary artery disease    [place] CV[place]   Right brain secondary to right internal carotid artery dissection for which 2 stents were placed;F/u cerebral angiography in 05/2008-minimal plaque; healing of previously identified left internal carotid artery dissection   Depression    GERD (gastroesophageal reflux disease)    Heart murmur    Hepatic cirrhosis [place] 12/20/2017   Hepatitis    History of blood transfusion    "related to spleen"   History of hiatal hernia    History of kidney stones    Hyperlipidemia    Hypertension    Hypothyroidism    Kidney stone    Mel[place] of [place], [place] [place]    Migraine    "a few migraines/year" (01/21/2016)   NSTEMI (non-ST elevated myocardial infarction) ([facility] [place] 01/21/2016   /[facility] Endo11/09/2015   Obesity    [place] [place]   2 carotid artery stents in place; small left parietal and  left hemispheric CVA.Phillis Knack 01/21/2016   Tobacco use disorder    50 pack years; questionably discontinued in 2010   Wears glasses      Allergies  Allergen Reactions   Gabapentin Nausea And Vomiting   Prednisone Palpitations   Tape Other (See Comments)    Causes blisters to form   Latex Rash and Other (See Comments)    Blisters     Current Outpatient Medications  Medication Sig Dispense Refill   albuterol (PROVENTIL HFA;VENTOLIN HFA) 108 (90 Base) MCG/ACT inhaler Inhale 2 puffs into the lungs every 6 (six) hours as needed for wheezing or shortness of breath.     albuterol (PROVENTIL) (2.5 MG/3ML) 0.083% nebulizer solution Take 2.5 mg by nebulization every 6 (six) hours as needed for wheezing or shortness of breath.     amLODipine (NORVASC) 5 MG tablet TAKE 1 TABLET BY MOUTH ONCE DAILY. 90 tablet 2   aspirin EC 81  MG tablet Take 1 tablet (81 mg total) by mouth daily.     aspirin-acetaminophen-caffeine (EXCEDRIN MIGRAINE) 250-250-65 MG tablet Take 1 tablet by mouth every 6 (six) hours as needed for headache.     atorvastatin (LIPITOR) 80 MG tablet TAKE ONE TABLET BY MOUTH DAILY AT 6 P.M. 90 tablet 3   azaTHIOprine (IMURAN) 50 MG tablet Take 1 tablet (50 mg total) by mouth daily. 30 tablet 0   carvedilol (COREG) 3.125 MG tablet TAEK 1 TABLET BY MOUTH TWICE DAILY WITH MEALS. 180 tablet 0   cetirizine (ZYRTEC) 10 MG tablet Take 10 mg by mouth daily.     Cholecalciferol (VITAMIN D3) 1.25 MG (50000 UT) CAPS Take 1 capsule by mouth once a week.     clopidogrel (PLAVIX) 75 MG tablet TAKE (1) TABLET BY MOUTH ONCE DAILY. 90 tablet 1   clorazepate (TRANXENE) 7.5 MG tablet Take 7.5 mg by mouth 3 (three) times daily as needed for anxiety.      docusate sodium (COLACE) 100 MG capsule Take 200 mg by mouth at bedtime.     famotidine (PEPCID) 20 MG tablet Take 1 tablet (20 mg total) by mouth at bedtime. 30 tablet 11   furosemide (LASIX) 40 MG tablet Take 40 mg by mouth daily as needed for fluid (leg/hand swelling.).     glipiZIDE (GLUCOTROL) 5 MG tablet Take 1 tablet (5 mg total) by mouth daily before breakfast. 30 tablet 3   HYDROcodone-acetaminophen (NORCO) 10-325 MG tablet Take 1 tablet by mouth 3 (three) times daily as needed for moderate pain.      isosorbide mononitrate (IMDUR) 120 MG 24 hr tablet TAKE ONE TABLET BY MOUTH ONCE DAILY. 90 tablet 2   JARDIANCE 10 MG TABS tablet Take 10 mg by mouth every morning.     Ketotifen Fumarate (ALLERGY EYE DROPS OP) Place 1 drop into both eyes daily as needed (allergies).     levothyroxine (SYNTHROID, LEVOTHROID) 50 MCG tablet Take 50 mcg by mouth daily before breakfast.      losartan (COZAAR) 50 MG tablet TAKE ONE TABLET BY MOUTH ONCE DAILY. 90 tablet 2   nitroGLYCERIN (NITROSTAT) 0.4 MG SL tablet Place 0.4 mg under the tongue every 5 (five) minutes as needed for chest pain.        ondansetron (ZOFRAN) 4 MG tablet Take 1 tablet (4 mg total) by mouth 3 (three) times daily as needed. 30 tablet 1   pantoprazole (PROTONIX) 40 MG tablet Take 1 tablet (40 mg total) by mouth daily. 90 tablet 0   SPIRIVA RESPIMAT 2.5  MCG/ACT AERS Inhale 2 puffs into the lungs daily as needed (shortness of breath).      Current Facility-Administered Medications  Medication Dose Route Frequency Provider Last Rate Last Admin   sodium chloride flush (NS) 0.9 % injection 3 mL  3 mL Intravenous Q12H Neizan Debruhl, Alphonse Guild, MD         Past Surgical History:  Procedure Laterality Date   ABDOMINAL HYSTERECTOMY  1978   APPENDECTOMY     BREAST BIOPSY Left 1970s; 2015   BREAST LUMPECTOMY Left 1970s   BREAST LUMPECTOMY WITH NEEDLE LOCALIZATION Left 07/07/2013   Procedure: BREAST LUMPECTOMY WITH NEEDLE LOCALIZATION;  Surgeon: Merrie Roof, MD;  Location: [place];  Service: General;  Laterality: Left;   CARDIAC CATHETERIZATION N/A 01/22/2016   Procedure: Left Heart Cath and Coronary Angiography;  Surgeon: Peter M Martinique, MD;  Location: [place] [facility];  Service: Cardiovascular;  Laterality: N/A;   CARDIAC CATHETERIZATION N/A 01/22/2016   Procedure: Coronary Stent Intervention;  Surgeon: Peter M Martinique, MD;  Location: [place] [facility];  Service: Cardiovascular;  Laterality: N/A;   CAROTID STENT Left 04/2003   small left parietal and left hemispheric CVA/notes 07/29/2010   CHOLECYSTECTOMY OPEN     COLONOSCOPY  2011   COLONOSCOPY N/A 05/10/2019   Procedure: COLONOSCOPY;  Surgeon: Rogene Houston, MD;  [facility];  Service: Endoscopy;  Laterality: N/A;  1:45   CORONARY ATHERECTOMY N/A 12/29/2019   Procedure: CORONARY ATHERECTOMY;  Surgeon: Jettie Booze, MD;  Location: [place] [facility];  Service: Cardiovascular;  Laterality: N/A;  Prox RCA   CORONARY STENT INTERVENTION N/A 12/29/2019   Procedure: CORONARY STENT INTERVENTION;  Surgeon: Jettie Booze, MD;   Location: [place] [facility];  Service: Cardiovascular;  Laterality: N/A;  Prox RCA   ESOPHAGOGASTRODUODENOSCOPY  08/06/2011   Procedure: ESOPHAGOGASTRODUODENOSCOPY (EGD);  Surgeon: Rogene Houston, MD;  [facility];  Service: Endoscopy;  Laterality: N/A;   ESOPHAGOGASTRODUODENOSCOPY N/A 05/10/2019   Procedure: ESOPHAGOGASTRODUODENOSCOPY (EGD);  Surgeon: Rogene Houston, MD;  [facility];  Service: Endoscopy;  Laterality: N/A;   FLEXIBLE SIGMOIDOSCOPY  08/06/2011   Procedure: FLEXIBLE SIGMOIDOSCOPY;  Surgeon: Rogene Houston, MD;  [facility];  Service: Endoscopy;  Laterality: N/A;   INGUINAL HERNIA REPAIR Left 1970s   INTRAVASCULAR ULTRASOUND/IVUS N/A 12/29/2019   Procedure: Intravascular Ultrasound/IVUS;  Surgeon: Jettie Booze, MD;  Location: [place] [facility];  Service: Cardiovascular;  Laterality: N/A;   IR ANGIO INTRA EXTRACRAN SEL COM CAROTID INNOMINATE BILAT MOD SED  11/01/2020   IR ANGIO VERTEBRAL SEL VERTEBRAL BILAT MOD SED  11/01/2020   IR RADIOLOGIST EVAL & MGMT  09/23/2020   IR US GUIDE VASC ACCESS RIGHT  11/01/2020   LARYNX SURGERY  1970s   Polyps excised   LEFT HEART CATH AND CORONARY ANGIOGRAPHY N/A 12/29/2019   Procedure: LEFT HEART CATH AND CORONARY ANGIOGRAPHY;  Surgeon: Jettie Booze, MD;  Location: [place] [facility];  Service: Cardiovascular;  Laterality: N/A;   MALONEY DILATION  05/10/2019   Procedure: Venia Minks DILATION;  Surgeon: Rogene Houston, MD;  [facility];  Service: Endoscopy;;   MELANOMA EXCISION Left ~ 2016   forearm   POLYPECTOMY  05/10/2019   Procedure: POLYPECTOMY;  Surgeon: Rogene Houston, MD;  [facility];  Service: Endoscopy;;  colon   SPLENECTOMY, 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   spontaneous rupture   TEMPORARY PACEMAKER N/A 12/29/2019  Procedure: TEMPORARY PACEMAKER;  Surgeon: Jettie Booze, MD;  Location: [place] [facility];  Service: Cardiovascular;  Laterality: N/A;     Allergies   Allergen Reactions   Gabapentin Nausea And Vomiting   Prednisone Palpitations   Tape Other (See Comments)    Causes blisters to form   Latex Rash and Other (See Comments)    Blisters      Family History  Problem Relation Age of Onset   Heart failure Mother    Cancer Father    Heart failure Father    Cancer Sister    Dementia Sister    Heart disease Other    Arthritis Other    Cancer Other    Diabetes Other    Kidney disease Other    Cancer Sister    Heart failure Brother      Social History Ms. Lurz reports that she has been smoking cigarettes. She has a 40.00 pack-year smoking history. She has been exposed to tobacco smoke. She has never used smokeless tobacco. Ms. Flitton reports no history of alcohol use.   Review of Systems CONSTITUTIONAL: No weight loss, fever, chills, weakness or fatigue.  HEENT: Eyes: No visual loss, blurred vision, double vision or yellow sclerae.No hearing loss, sneezing, congestion, runny nose or sore throat.  SKIN: No rash or itching.  CARDIOVASCULAR: per hpi RESPIRATORY: No shortness of breath, cough or sputum.  GASTROINTESTINAL: No anorexia, nausea, vomiting or diarrhea. No abdominal pain or blood.  GENITOURINARY: No burning on urination, no polyuria NEUROLOGICAL: No headache, dizziness, syncope, paralysis, ataxia, numbness or tingling in the extremities. No change in bowel or bladder control.  MUSCULOSKELETAL: No muscle, back pain, joint pain or stiffness.  LYMPHATICS: No enlarged nodes. No history of splenectomy.  PSYCHIATRIC: No history of depression or anxiety.  ENDOCRINOLOGIC: No reports of sweating, cold or heat intolerance. No polyuria or polydipsia.  Marland Kitchen   Physical Examination Today's Vitals   12/12/21 0858  BP: 106/66  Pulse: 74  SpO2: 95%  Weight: 175 lb 12.8 oz (79.7 kg)  Height: '5\' 3"'$  (1.6 m)   Body mass index is 31.14 kg/m.  Gen: resting comfortably, no acute distress HEENT: no scleral icterus, pupils equal  round and reactive, no palptable cervical adenopathy,  CV: RRR, no mr/g no jvd Resp: Clear to auscultation bilaterally GI: abdomen is soft, non-tender, non-distended, normal bowel sounds, no hepatosplenomegaly MSK: extremities are warm, no edema.  Skin: warm, no rash Neuro:  no focal deficits Psych: appropriate affect   Diagnostic Studies 01/2016 cath The left ventricular systolic function is normal. LV end diastolic pressure is normal. The left ventricular ejection fraction is 55-65% by visual estimate. Prox RCA lesion, 90 %stenosed. 1st Diag lesion, 60 %stenosed. Prox LAD to Mid LAD lesion, 20 %stenosed. 2nd Diag lesion, 40 %stenosed. Ost Ramus to Ramus lesion, 50 %stenosed. 1st Mrg lesion, 99 %stenosed. A STENT SYNERGY DES 2.75X20 drug eluting stent was successfully placed. Post intervention, there is a 0% residual stenosis. Ost RCA to Prox RCA lesion, 90 %stenosed. Post intervention, there is a 90% residual stenosis with dissection from the proximal to mid RCA.   1. Severe 2 vessel obstructive CAD 2. Normal LV function and LV EDP 3. Successful stenting of the first OM with DES. This appears to be the culprit vessel. 4. Unsuccessful PCI of the RCA due to wire induced dissection of the vessel.    Plan: DAPT for one year. Aggressive BP control. Will continue IV Ntg overnight. Despite RCA dissection patient  is pain free, hemodynamically stable and has a normal Ecg. If her symptoms remain stable I would treat medically and allow the RCA to heal. If she continues to have angina attempt at PCI of the RCA could be considered once the vessel has had time to heal - 6-8 weeks.      01/2016 echo Study Conclusions   - Left ventricle: The cavity size was normal. Systolic function was   vigorous. The estimated ejection fraction was in the range of 65%   to 70%. Wall motion was normal; there were no regional wall   motion abnormalities. There was an increased relative   contribution of  atrial contraction to ventricular filling.   Doppler parameters are consistent with abnormal left ventricular   relaxation (grade 1 diastolic dysfunction). Doppler parameters   are consistent with high ventricular filling pressure. - Tricuspid valve: There was trivial regurgitation. - Pulmonary arteries: PA peak pressure: 32 mm Hg (S). - Pericardium, extracardiac: A trivial, free-flowing pericardial   effusion was identified along the right ventricular free wall.   The fluid had no internal echoes.     12/2019 cath Previously placed 1st Mrg drug eluting stent is widely patent. Ost RCA to Prox RCA lesion is 90% stenosed. After orbital atherectomy drug-eluting stent was successfully placed using a STENT RESOLUTE ONYX 3.0X26, and postdilated to 3.75 mm. Stent was optimized with IVUS. Post intervention, there is a 0% residual stenosis. The left ventricular systolic function is normal. LV end diastolic pressure is normal. The left ventricular ejection fraction is 55-65% by visual estimate. There is no aortic valve stenosis. RCA was prone to vasospasm during the PCI.   Continue dual antiplatelet therapy for at least 6 months.  Consider clopidogrel monotherapy long-term given some diffuse disease in her LAD system.  Continue with attempts at aggressive secondary prevention.   Plan for same day discharge.     08/2020 nuclear stress Lexiscan stress is electrically negative for ischemia Myoview scan shows mild thinning of anterior wall (mid/distal) with decreased tracer activity consistent with mild anterior ischemia; Cannot exclude however that changes are due to shifting breast attenuation. LVEF calculated at 80% This is a low risk study.    Assessment and Plan  1. CAD - stress test last year without significant ischemia - recent very high stress with passing of her son, her dog of 12 years as well who was like a part of her family. In those settings some intermittent arm pain better  with NG. No exertional symptmos. I suspect symptoms related to stress, perhals recurrence of her prior vasospasm. Monitor at this time, room to titrate imdur if needed     2.HTN - LE edema on higher norvasc dosing.  - low normal bp today, reports recent dizziness while standing. Endorses adequate hydration. Will lower her losartan to '25mg'$  daily.    4. Chronic diastolic HF - euvoelmic, continue to monitor   5. Hyperlipidemia - continue current meds, reports recent labs with pcp. May need to consider changing to crestor or adding zetia, would try to get LDL <55 for her.       Arnoldo Lenis, M.D.

## 2021-12-12 NOTE — Patient Instructions (Signed)
Medication Instructions:  Decrease Losartan to '25mg'$  daily  Continue all other medications.     Labwork: none  Testing/Procedures: none  Follow-Up: 6 months   Any Other Special Instructions Will Be Listed Below (If Applicable).   If you need a refill on your cardiac medications before your next appointment, please call your pharmacy.

## 2021-12-17 ENCOUNTER — Encounter: Payer: Self-pay | Admitting: *Deleted

## 2021-12-25 ENCOUNTER — Ambulatory Visit (HOSPITAL_COMMUNITY)
Admission: RE | Admit: 2021-12-25 | Discharge: 2021-12-25 | Disposition: A | Discharge status: Home / Self Care | Payer: Medicare Other | Source: Ambulatory Visit | Attending: Gastroenterology | Admitting: Gastroenterology

## 2021-12-25 ENCOUNTER — Encounter (HOSPITAL_COMMUNITY): Payer: Self-pay | Admitting: Speech Pathology

## 2021-12-25 ENCOUNTER — Ambulatory Visit (HOSPITAL_COMMUNITY): Payer: Medicare Other | Attending: Family Medicine | Admitting: Speech Pathology

## 2021-12-25 DIAGNOSIS — R1312 Dysphagia, oropharyngeal phase: Secondary | ICD-10-CM | POA: Diagnosis present

## 2021-12-25 DIAGNOSIS — K743 Primary biliary cirrhosis: Secondary | ICD-10-CM | POA: Insufficient documentation

## 2021-12-25 DIAGNOSIS — I8511 Secondary esophageal varices with bleeding: Secondary | ICD-10-CM | POA: Diagnosis present

## 2021-12-25 NOTE — Therapy (Signed)
[place], [place], [address] Phone: [phone]   Fax:  [phone]  Modified Barium Swallow  Patient Details  Name: [name] MRN: [id number] Date of Birth: 06-02-1950 No data recorded  Encounter Date: 12/25/2021   End of Session - 12/25/21 1313     Visit Number 1    Number of Visits 1    Authorization Type [employer] Medicare    SLP Start Time 1140    SLP Stop Time  1210    SLP Time Calculation (min) 30 min    Activity Tolerance Patient tolerated treatment well             Past Medical History:  Diagnosis Date   Anxiety    Arthritis    "hands, arms, back, neck, knees, fingers" (01/21/2016)   Atrial fibrillation ([place])    ASA daiy; came off Coumadin shortly after stroke   Breast cancer, left breast ([place]) 1970s; 2015   Cancer of skin of leg    BLE   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[facility])    left   Chronic lower back pain    Chronic pain    COPD (chronic obstructive pulmonary disease) ([place])    Coronary artery disease    CVA (cerebral vascular accident) ([place]) pt denies right brain CVA   Right brain secondary to right internal carotid artery dissection for which 2 stents were placed;F/u cerebral angiography in 05/2008-minimal plaque; healing of previously identified left internal carotid artery dissection   Depression    GERD (gastroesophageal reflux disease)    Heart murmur    Hepatic cirrhosis ([place]) 12/20/2017   Hepatitis    History of blood transfusion    "related to spleen"   History of hiatal hernia    History of kidney stones    Hyperlipidemia    Hypertension    Hypothyroidism    Kidney stone    Melanoma of forearm, left ([place])    Migraine    "a few migraines/year" (01/21/2016)   NSTEMI (non-ST elevated myocardial infarction) ([place]) 01/21/2016   Archie Endo 01/21/2016   Obesity    Stroke ([place]) 04/2003   2 carotid artery stents in place; small left parietal and left hemispheric CVA.Phillis Knack 01/21/2016    Tobacco use disorder    50 pack years; questionably discontinued in 2010   Wears glasses     Past Surgical History:  Procedure Laterality Date   ABDOMINAL HYSTERECTOMY  1978   APPENDECTOMY     BREAST BIOPSY Left 1970s; 2015   BREAST LUMPECTOMY Left 1970s   BREAST LUMPECTOMY WITH NEEDLE LOCALIZATION Left 07/07/2013   Procedure: BREAST LUMPECTOMY WITH NEEDLE LOCALIZATION;  Surgeon: Merrie Roof, MD;  Location: [facility];  Service: General;  Laterality: Left;   CARDIAC CATHETERIZATION N/A 01/22/2016   Procedure: Left Heart Cath and Coronary Angiography;  Surgeon: Peter M Martinique, MD;  Location: [facility];  Service: Cardiovascular;  Laterality: N/A;   CARDIAC CATHETERIZATION N/A 01/22/2016   Procedure: Coronary Stent Intervention;  Surgeon: Peter M Martinique, MD;  Location: [facility];  Service: Cardiovascular;  Laterality: N/A;   CAROTID STENT Left 04/2003   small left parietal and left hemispheric CVA/notes 07/29/2010   CHOLECYSTECTOMY OPEN     COLONOSCOPY  2011   COLONOSCOPY N/A 05/10/2019   Procedure: COLONOSCOPY;  Surgeon: Rogene Houston, MD;  [facility];  Service: Endoscopy;  Laterality: N/A;  1:45   CORONARY ATHERECTOMY N/A 12/29/2019  Procedure: CORONARY ATHERECTOMY;  Surgeon: Jettie Booze, MD;  Location: [facility];  Service: Cardiovascular;  Laterality: N/A;  Prox RCA   CORONARY STENT INTERVENTION N/A 12/29/2019   Procedure: CORONARY STENT INTERVENTION;  Surgeon: Jettie Booze, MD;  Location: [facility];  Service: Cardiovascular;  Laterality: N/A;  Prox RCA   ESOPHAGOGASTRODUODENOSCOPY  08/06/2011   Procedure: ESOPHAGOGASTRODUODENOSCOPY (EGD);  Surgeon: Rogene Houston, MD;  [facility];  Service: Endoscopy;  Laterality: N/A;   ESOPHAGOGASTRODUODENOSCOPY N/A 05/10/2019   Procedure: ESOPHAGOGASTRODUODENOSCOPY (EGD);  Surgeon: Rogene Houston, MD;  [facility];  Service: Endoscopy;   Laterality: N/A;   FLEXIBLE SIGMOIDOSCOPY  08/06/2011   Procedure: FLEXIBLE SIGMOIDOSCOPY;  Surgeon: Rogene Houston, MD;  [facility];  Service: Endoscopy;  Laterality: N/A;   INGUINAL HERNIA REPAIR Left 1970s   INTRAVASCULAR ULTRASOUND/IVUS N/A 12/29/2019   Procedure: Intravascular Ultrasound/IVUS;  Surgeon: Jettie Booze, MD;  Location: [facility];  Service: Cardiovascular;  Laterality: N/A;   IR ANGIO INTRA EXTRACRAN SEL COM CAROTID INNOMINATE BILAT MOD SED  11/01/2020   IR ANGIO VERTEBRAL SEL VERTEBRAL BILAT MOD SED  11/01/2020   IR RADIOLOGIST EVAL & MGMT  09/23/2020   IR US GUIDE VASC ACCESS RIGHT  11/01/2020   LARYNX SURGERY  1970s   Polyps excised   LEFT HEART CATH AND CORONARY ANGIOGRAPHY N/A 12/29/2019   Procedure: LEFT HEART CATH AND CORONARY ANGIOGRAPHY;  Surgeon: Jettie Booze, MD;  Location: [facility];  Service: Cardiovascular;  Laterality: N/A;   MALONEY DILATION  05/10/2019   Procedure: Venia Minks DILATION;  Surgeon: Rogene Houston, MD;  [facility];  Service: Endoscopy;;   MELANOMA EXCISION Left ~ 2016   forearm   POLYPECTOMY  05/10/2019   Procedure: POLYPECTOMY;  Surgeon: Rogene Houston, MD;  [facility];  Service: Endoscopy;;  colon   SPLENECTOMY, 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   spontaneous rupture   TEMPORARY PACEMAKER N/A 12/29/2019   Procedure: TEMPORARY PACEMAKER;  Surgeon: Jettie Booze, MD;  Location: [facility];  Service: Cardiovascular;  Laterality: N/A;    There were no vitals filed for this visit.        General - 12/25/21 1259       General Information   Date of Onset 12/04/21    HPI [name] is a 71 y.o. 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 Patient reports she continues to have issues with swallowing, will get choked on foods, liquids, saliva. This occurs almost daily. Sometimes foods feel that they get stuck in lower chest. She states that when she gets choked on something it takes her breath and causes coughing. She denies any acid regurgitation. Endorses previous surgery on her vocal cords in the 1990s. She is doing well on pantoprazole '40mg'$  once daily without breakthrough symptoms.  MBSS ordered by Scherrie Gerlach.    Type of Study MBS-Modified Barium Swallow Study    Diet Prior to this Study Regular;Thin liquids    Temperature Spikes Noted No    Respiratory Status Room air    History of Recent Intubation No    Behavior/Cognition Alert;Cooperative;Pleasant mood    Oral Cavity Assessment Within Functional Limits    Oral Care Completed by SLP No  Oral Cavity - Dentition Dentures, top;Dentures, bottom   partials   Vision Functional for self feeding    Self-Feeding Abilities Able to feed self    Patient Positioning Upright in chair    Baseline Vocal Quality Normal    Volitional Cough Strong    Volitional Swallow Able to elicit    Anatomy Within functional limits    Pharyngeal Secretions Not observed secondary MBS                Oral Preparation/Oral Phase - 12/25/21 1308       Oral Preparation/Oral Phase   Oral Phase Within functional limits              Pharyngeal Phase - 12/25/21 1309       Pharyngeal Phase   Pharyngeal Phase Impaired      Pharyngeal - Thin   Pharyngeal- Thin Teaspoon Within functional limits   premature spillage   Pharyngeal- Thin Cup Swallow initiation at vallecula;Swallow initiation at pyriform sinus;Reduced airway/laryngeal closure;Penetration/Aspiration during swallow    Pharyngeal Material enters airway, remains ABOVE vocal cords and not ejected out    Pharyngeal- Thin Straw Swallow initiation at pyriform sinus;Reduced airway/laryngeal  closure;Penetration/Aspiration during swallow    Pharyngeal Material enters airway, remains ABOVE vocal cords and not ejected out      Pharyngeal - Solids   Pharyngeal- Puree Within functional limits    Pharyngeal- Regular Within functional limits    Pharyngeal- Pill Within functional limits   penetration of thins when taking pill     Electrical Stimulation - Pharyngeal Phase   Was Electrical Stimulation Used No              Cricopharyngeal Phase - 12/25/21 1311       Cervical Esophageal Phase   Cervical Esophageal Phase Impaired      Cervical Esophageal Phase - Solids   Pill Other (Comment)      Cervical Esophageal Phase - Comment   Other Esophageal Phase Observations Barium tablet remained in thoracic esophagus despite liquid wash and Pt reported globus sensation higher up (suspect referred). Pill cleared after puree wash and Pt continued to report mild globus                      Plan - 12/25/21 1314     Clinical Impression Statement Pt presents with min oropharyngeal phase dysphagia characterized by min premature spillage of liquids over the base of tongue with slight delay in swallow trigger resulting in trace penetration of thins via cup/straw during the swallow (underepiglottic coating) which was not always removed, but never reached the vocal folds, WNL puree and solids. The barium tablet was taken with thin liquids and became transiently delayed in the thoracic esophagus and was not removed with liquid wash. Pt reported globus sensation and touched her neck and near sternal notch (referred). The barium tablet was cleared with puree wash and Pt continued to report min globus. Esophageal sweep otherwise WNL besided pill stasis. Pt has COPD and h/o polyps on her vocal folds (1990s) and presents with harsh vocal quality. Pt reports getting "choked" on her saliva and on thin liquids, which results in strong coughing and shortness of breath and is quite concerning to  Pt. Individuals with COPD are at a higher risk for aspiration with thin liquids due altered respiration and coordination of breathing and swallowing. Pt was encouraged to take small sips while briefly holding the liquid in her mouth before swallow and to swallow  before she speaks to clear any pooled secretions/saliva, clear throat periodically and dry swallow. Imaging and recommendations reviewed with Pt, no further SLP services indicated at this time. Pt may wish to pursue f/u with GI, as she reported that symptoms alleviated last time after EGD.    Treatment/Interventions SLP instruction and feedback    Potential to Achieve Goals Good    Consulted and Agree with Plan of Care Patient             Patient will benefit from skilled therapeutic intervention in order to improve the following deficits and impairments:   Oropharyngeal dysphagia     Recommendations/Treatment - 12/25/21 1312       Swallow Evaluation Recommendations   Recommended Consults Consider GI evaluation   Pt reported relief in symptoms following empiric dilation of last EGD   SLP Diet Recommendations Age appropriate regular;Thin    Liquid Administration via Cup;Straw    Medication Administration Whole meds with liquid    Supervision Patient able to self feed    Compensations Clear throat intermittently    Postural Changes Seated upright at 90 degrees;Remain upright for at least 30 minutes after feeds/meals               Problem List Patient Active Problem List   Diagnosis Date Noted   Dilated bile duct 10/21/2020   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   Oropharyngeal dysphagia 04/11/2020   Autoimmune hepatitis ([place]) 04/11/2020   Edema 01/08/2020   CAD S/P percutaneous coronary angioplasty    Gastroesophageal reflux disease 03/23/2019   Family history of colon cancer 03/23/2019   Esophageal dysphagia 03/23/2019   Type 2 diabetes mellitus with hyperglycemia, without long-term current use of  insulin ([place])    COPD with acute exacerbation ([place]) 04/19/2018   Hepatic cirrhosis ([place]) 12/20/2017   Bradycardia 08/21/2016   NSTEMI (non-ST elevation myocardial infarction) ([place]) 01/21/2016   Chronic diastolic CHF (congestive heart failure) ([place]) 01/01/2016   Symptomatic bradycardia 09/30/2015   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/30/2015   Essential hypertension 09/30/2015   Chronic pain 09/30/2015   Surgical menopause on hormone replacement therapy 09/30/2015   GERD (gastroesophageal reflux disease) 09/30/2015   Hypothyroidism 09/30/2015   Complete heart block ([place]) 09/30/2015   Breast mass 04/27/2013   Deltoid tendinitis of left shoulder 11/23/2012   Rotator cuff tear 10/06/2012   Rotator cuff syndrome of left shoulder 10/06/2012   Pain in joint, shoulder region 10/06/2012   Tendonitis, calcific, shoulder 07/26/2012   History of CVA (cerebrovascular accident)    COPD (chronic obstructive pulmonary disease) ([place])    Dyslipidemia, goal LDL below 70    Obesity    Chest tightness    Tobacco use disorder    Thank you,  Genene Churn, [place]  Genene Churn, [place] 12/25/2021, 1:39 PM  [place], [place], [address] Phone: [phone]   Fax:  [phone]  Name: BARABARA MOTZ MRN: [id number] Date of Birth: 06-16-1950

## 2021-12-29 ENCOUNTER — Ambulatory Visit (HOSPITAL_COMMUNITY)
Admission: RE | Admit: 2021-12-29 | Discharge: 2021-12-29 | Disposition: A | Discharge status: Home / Self Care | Payer: Medicare Other | Source: Ambulatory Visit | Attending: Gastroenterology | Admitting: Gastroenterology

## 2021-12-29 ENCOUNTER — Other Ambulatory Visit (INDEPENDENT_AMBULATORY_CARE_PROVIDER_SITE_OTHER): Payer: Self-pay | Admitting: Gastroenterology

## 2021-12-29 DIAGNOSIS — K754 Autoimmune hepatitis: Secondary | ICD-10-CM | POA: Insufficient documentation

## 2021-12-29 DIAGNOSIS — K838 Other specified diseases of biliary tract: Secondary | ICD-10-CM

## 2021-12-29 MED ORDER — GADOBUTROL 1 MMOL/ML IV SOLN
7.5000 mL | Freq: Once | INTRAVENOUS | Status: AC | PRN
  Administered 2021-12-29: 7.5 mL via INTRAVENOUS

## 2022-01-13 ENCOUNTER — Telehealth (INDEPENDENT_AMBULATORY_CARE_PROVIDER_SITE_OTHER): Payer: Self-pay | Admitting: Gastroenterology

## 2022-01-13 ENCOUNTER — Other Ambulatory Visit (INDEPENDENT_AMBULATORY_CARE_PROVIDER_SITE_OTHER): Payer: Self-pay | Admitting: Gastroenterology

## 2022-01-13 ENCOUNTER — Telehealth (INDEPENDENT_AMBULATORY_CARE_PROVIDER_SITE_OTHER): Payer: Self-pay | Admitting: *Deleted

## 2022-01-13 DIAGNOSIS — K754 Autoimmune hepatitis: Secondary | ICD-10-CM

## 2022-01-13 MED ORDER — AZATHIOPRINE 50 MG PO TABS: 50.0000 mg | ORAL_TABLET | Freq: Every day | ORAL | 1 refills | Status: DC

## 2022-01-13 NOTE — Telephone Encounter (Signed)
Patient last seen 12/04/21. Took last azathioprine '50mg'$  one daily today. Would like refill to [place] [facility] to deliver med to her.   Pt would like a call back after sent.

## 2022-01-13 NOTE — Telephone Encounter (Signed)
Patient notified

## 2022-01-13 NOTE — Telephone Encounter (Signed)
Patients case worker [name] called to make an appointment for patient - stated patient was to have a procedure and she needed to schedule a follow up - please advise

## 2022-01-13 NOTE — Telephone Encounter (Signed)
She is also aware she does not need a follow up here and we can schedule EGD. She wants to social worker [name] to schedule EGD. I left [name] a message to call back to discuss.

## 2022-01-13 NOTE — Telephone Encounter (Signed)
Left message to return call with social worker [name]

## 2022-01-14 ENCOUNTER — Other Ambulatory Visit (INDEPENDENT_AMBULATORY_CARE_PROVIDER_SITE_OTHER): Payer: Self-pay | Admitting: Gastroenterology

## 2022-01-14 DIAGNOSIS — K219 Gastro-esophageal reflux disease without esophagitis: Secondary | ICD-10-CM

## 2022-01-14 DIAGNOSIS — R1319 Other dysphagia: Secondary | ICD-10-CM

## 2022-01-14 NOTE — Telephone Encounter (Signed)
Patient wants you to call social worker to schedule EGD. Social worker is Media planner number [phone] ext [phone]. Patient aware she does not need a follow up in office and may go ahead and schedule EGD per [facility].

## 2022-01-20 NOTE — Telephone Encounter (Signed)
I dont think patient got scheduled. Patient wanted her social worker to schedule. Her name is [name] [phone] ext [phone]

## 2022-01-20 NOTE — Telephone Encounter (Signed)
[name] called back. Patient scheduled for 12/21. Aware will fax instructions with pre-op appt to her at [phone]

## 2022-01-20 NOTE — Telephone Encounter (Signed)
PA approved via [facility]. Auth# [id number], DOS: Mar 05, 2022 - Mar 15, 2022

## 2022-01-20 NOTE — Telephone Encounter (Addendum)
Called [name] and LMOVM to call back to patient Per [facility] patient is EGD +/-dil with Dr. Jenetta Downer, asa 3

## 2022-01-21 NOTE — Telephone Encounter (Signed)
Instructions/pre-op faxed to [employer].

## 2022-02-24 NOTE — Patient Instructions (Signed)
[name]  02/24/2022     '@PREFPERIOPPHARMACY'$ @   Your procedure is scheduled on  03/05/2022.   Report to [facility] at  [place]  A.M.   Call this number if you have problems the morning of surgery:  [phone]  If you experience any cold or flu symptoms such as cough, fever, chills, shortness of breath, etc. between now and your scheduled surgery, please notify us at the above number.   Remember:  Follow the diet instructions given to you by the office.     DO NOT take any medications for diabetes the morning of your procedure.        Use your nebulizer and your inhalers before you come and bring your rescue inhaler with you.     Take these medicines the morning of surgery with A SIP OF WATER        amlodipine, imuran, zyrtec, clorazepate, hydrocodone(if needed), Isosorbide, levothyroxine, zofran (if needed), protonix.     Do not wear jewelry, make-up or nail polish.  Do not wear lotions, powders, or perfumes, or deodorant.  Do not shave 48 hours prior to surgery.  Men may shave face and neck.  Do not bring valuables to the hospital.  [facility] is not responsible for any belongings or valuables.  Contacts, dentures or bridgework may not be worn into surgery.  Leave your suitcase in the car.  After surgery it may be brought to your room.  For patients admitted to the hospital, discharge time will be determined by your treatment team.  Patients discharged the day of surgery will not be allowed to drive home and must have someone with them for 24 hours.    Special instructions:   DO NOT smoke tobacco or vape for 24 hours before your procedure.  Please read over the following fact sheets that you were given. Anesthesia Post-op Instructions and Care and Recovery After Surgery      Upper Endoscopy, Adult, Care After After the procedure, it is common to have a sore throat. It is also common to have: Mild stomach pain or  discomfort. Bloating. Nausea. Follow these instructions at home: The instructions below may help you care for yourself at home. Your health care provider may give you more instructions. If you have questions, ask your health care provider. If you were given a sedative during the procedure, it can affect you for several hours. Do not drive or operate machinery until your health care provider says that it is safe. If you will be going home right after the procedure, plan to have a responsible adult: Take you home from the hospital or clinic. You will not be allowed to drive. Care for you for the time you are told. Follow instructions from your health care provider about what you may eat and drink. Return to your normal activities as told by your health care provider. Ask your health care provider what activities are safe for you. Take over-the-counter and prescription medicines only as told by your health care provider. Contact a health care provider if you: Have a sore throat that lasts longer than one day. Have trouble swallowing. Have a fever. Get help right away if you: Vomit blood or your vomit looks like coffee grounds. Have bloody, black, or tarry stools. Have a very bad sore throat or you cannot swallow. Have difficulty breathing or very bad pain in your chest or abdomen. These symptoms may be an emergency. Get  help right away. Call 911. Do not wait to see if the symptoms will go away. Do not drive yourself to the hospital. Summary After the procedure, it is common to have a sore throat, mild stomach discomfort, bloating, and nausea. If you were given a sedative during the procedure, it can affect you for several hours. Do not drive until your health care provider says that it is safe. Follow instructions from your health care provider about what you may eat and drink. Return to your normal activities as told by your health care provider. This information is not intended to replace  advice given to you by your health care provider. Make sure you discuss any questions you have with your health care provider. Document Revised: 06/11/2021 Document Reviewed: 06/11/2021 Elsevier Patient Education  [place]. Esophageal Dilatation Esophageal dilatation, also called esophageal dilation, is a procedure to widen or open a blocked or narrowed part of the esophagus. The esophagus is the part of the body that moves food and liquid from the mouth to the stomach. You may need this procedure if: You have a buildup of scar tissue in your esophagus that makes it difficult, painful, or impossible to swallow. This can be caused by gastroesophageal reflux disease (GERD). You have cancer of the esophagus. There is a problem with how food moves through your esophagus. In some cases, you may need this procedure repeated at a later time to dilate the esophagus gradually. Tell a health care provider about: Any allergies you have. All medicines you are taking, including vitamins, herbs, eye drops, creams, and over-the-counter medicines. Any problems you or family members have had with anesthetic medicines. Any blood disorders you have. Any surgeries you have had. Any medical conditions you have. Any antibiotic medicines you are required to take before dental procedures. Whether you are pregnant or may be pregnant. What are the risks? Generally, this is a safe procedure. However, problems may occur, including: Bleeding due to a tear in the lining of the esophagus. A hole, or perforation, in the esophagus. What happens before the procedure? Ask your health care provider about: Changing or stopping your regular medicines. This is especially important if you are taking diabetes medicines or blood thinners. Taking medicines such as aspirin and ibuprofen. These medicines can thin your blood. Do not take these medicines unless your health care provider tells you to take them. Taking  over-the-counter medicines, vitamins, herbs, and supplements. Follow instructions from your health care provider about eating or drinking restrictions. Plan to have a responsible adult take you home from the hospital or clinic. Plan to have a responsible adult care for you for the time you are told after you leave the hospital or clinic. This is important. What happens during the procedure? You may be given a medicine to help you relax (sedative). A numbing medicine may be sprayed into the back of your throat, or you may gargle the medicine. Your health care provider may perform the dilatation using various surgical instruments, such as: Simple dilators. This instrument is carefully placed in the esophagus to stretch it. Guided wire bougies. This involves using an endoscope to insert a wire into the esophagus. A dilator is passed over this wire to enlarge the esophagus. Then the wire is removed. Balloon dilators. An endoscope with a small balloon is inserted into the esophagus. The balloon is inflated to stretch the esophagus and open it up. The procedure may vary among health care providers and hospitals. What can I expect  after the procedure? Your blood pressure, heart rate, breathing rate, and blood oxygen level will be monitored until you leave the hospital or clinic. Your throat may feel slightly sore and numb. This will get better over time. You will not be allowed to eat or drink until your throat is no longer numb. When you are able to drink, urinate, and sit on the edge of the bed without nausea or dizziness, you may be able to return home. Follow these instructions at home: Take over-the-counter and prescription medicines only as told by your health care provider. If you were given a sedative during the procedure, it can affect you for several hours. Do not drive or operate machinery until your health care provider says that it is safe. Plan to have a responsible adult care for you for  the time you are told. This is important. Follow instructions from your health care provider about any eating or drinking restrictions. Do not use any products that contain nicotine or tobacco, such as cigarettes, e-cigarettes, and chewing tobacco. If you need help quitting, ask your health care provider. Keep all follow-up visits. This is important. Contact a health care provider if: You have a fever. You have pain that is not relieved by medicine. Get help right away if: You have chest pain. You have trouble breathing. You have trouble swallowing. You vomit blood. You have black, tarry, or bloody stools. These symptoms may represent a serious problem that is an emergency. Do not wait to see if the symptoms will go away. Get medical help right away. Call your local emergency services (911 in the [place] Do not drive yourself to the hospital. Summary Esophageal dilatation, also called esophageal dilation, is a procedure to widen or open a blocked or narrowed part of the esophagus. Plan to have a responsible adult take you home from the hospital or clinic. For this procedure, a numbing medicine may be sprayed into the back of your throat, or you may gargle the medicine. Do not drive or operate machinery until your health care provider says that it is safe. This information is not intended to replace advice given to you by your health care provider. Make sure you discuss any questions you have with your health care provider. Document Revised: 07/19/2019 Document Reviewed: 07/19/2019 Elsevier Patient Education  [place] After The following information offers guidance on how to care for yourself after your procedure. Your health care provider may also give you more specific instructions. If you have problems or questions, contact your health care provider. What can I expect after the procedure? After the procedure, it is common to  have: Tiredness. Little or no memory about what happened during or after the procedure. Impaired judgment when it comes to making decisions. Nausea or vomiting. Some trouble with balance. Follow these instructions at home: For the time period you were told by your health care provider:  Rest. Do not participate in activities where you could fall or become injured. Do not drive or use machinery. Do not drink alcohol. Do not take sleeping pills or medicines that cause drowsiness. Do not make important decisions or sign legal documents. Do not take care of children on your own. Medicines Take over-the-counter and prescription medicines only as told by your health care provider. If you were prescribed antibiotics, take them as told by your health care provider. Do not stop using the antibiotic even if you start to feel better. Eating and drinking Follow instructions  from your health care provider about what you may eat and drink. Drink enough fluid to keep your urine pale yellow. If you vomit: Drink clear fluids slowly and in small amounts as you are able. Clear fluids include water, ice chips, low-calorie sports drinks, and fruit juice that has water added to it (diluted fruit juice). Eat light and bland foods in small amounts as you are able. These foods include bananas, applesauce, rice, lean meats, toast, and crackers. General instructions  Have a responsible adult stay with you for the time you are told. It is important to have someone help care for you until you are awake and alert. If you have sleep apnea, surgery and some medicines can increase your risk for breathing problems. Follow instructions from your health care provider about wearing your sleep device: When you are sleeping. This includes during daytime naps. While taking prescription pain medicines, sleeping medicines, or medicines that make you drowsy. Do not use any products that contain nicotine or tobacco. These  products include cigarettes, chewing tobacco, and vaping devices, such as e-cigarettes. If you need help quitting, ask your health care provider. Contact a health care provider if: You feel nauseous or vomit every time you eat or drink. You feel light-headed. You are still sleepy or having trouble with balance after 24 hours. You get a rash. You have a fever. You have redness or swelling around the IV site. Get help right away if: You have trouble breathing. You have new confusion after you get home. These symptoms may be an emergency. Get help right away. Call 911. Do not wait to see if the symptoms will go away. Do not drive yourself to the hospital. This information is not intended to replace advice given to you by your health care provider. Make sure you discuss any questions you have with your health care provider. Document Revised: 07/28/2021 Document Reviewed: 07/28/2021 Elsevier Patient Education  [place].

## 2022-02-25 ENCOUNTER — Encounter (HOSPITAL_COMMUNITY)
Admission: RE | Admit: 2022-02-25 | Discharge: 2022-02-25 | Disposition: A | Discharge status: Home / Self Care | Payer: Medicare Other | Source: Ambulatory Visit | Attending: Gastroenterology | Admitting: Gastroenterology

## 2022-02-25 ENCOUNTER — Encounter (HOSPITAL_COMMUNITY): Payer: Self-pay

## 2022-02-25 VITALS — BP 148/96 | HR 90 | Temp 97.8°F | Resp 18 | Ht 63.0 in | Wt 175.7 lb

## 2022-02-25 DIAGNOSIS — K7460 Unspecified cirrhosis of liver: Secondary | ICD-10-CM | POA: Insufficient documentation

## 2022-02-25 DIAGNOSIS — Z01812 Encounter for preprocedural laboratory examination: Secondary | ICD-10-CM | POA: Insufficient documentation

## 2022-02-25 DIAGNOSIS — E1165 Type 2 diabetes mellitus with hyperglycemia: Secondary | ICD-10-CM | POA: Diagnosis not present

## 2022-02-25 LAB — CBC WITH DIFFERENTIAL/PLATELET
Abs Immature Granulocytes: 0.02 10*3/uL (ref 0.00–0.07)
Basophils Absolute: 0.1 10*3/uL (ref 0.0–0.1)
Basophils Relative: 1 %
Eosinophils Absolute: 0.2 10*3/uL (ref 0.0–0.5)
Eosinophils Relative: 2 %
HCT: 44.6 % (ref 36.0–46.0)
Hemoglobin: 14.7 g/dL (ref 12.0–15.0)
Immature Granulocytes: 0 %
Lymphocytes Relative: 33 %
Lymphs Abs: 2.8 10*3/uL (ref 0.7–4.0)
MCH: 29.9 pg (ref 26.0–34.0)
MCHC: 33 g/dL (ref 30.0–36.0)
MCV: 90.8 fL (ref 80.0–100.0)
Monocytes Absolute: 1.2 10*3/uL — ABNORMAL HIGH (ref 0.1–1.0)
Monocytes Relative: 13 %
Neutro Abs: 4.4 10*3/uL (ref 1.7–7.7)
Neutrophils Relative %: 51 %
Platelets: 333 10*3/uL (ref 150–400)
RBC: 4.91 MIL/uL (ref 3.87–5.11)
RDW: 13.8 % (ref 11.5–15.5)
WBC: 8.6 10*3/uL (ref 4.0–10.5)
nRBC: 0 % (ref 0.0–0.2)

## 2022-02-25 LAB — COMPREHENSIVE METABOLIC PANEL
ALT: 16 U/L (ref 0–44)
AST: 21 U/L (ref 15–41)
Albumin: 3.9 g/dL (ref 3.5–5.0)
Alkaline Phosphatase: 58 U/L (ref 38–126)
Anion gap: 8 (ref 5–15)
BUN: 12 mg/dL (ref 8–23)
CO2: 27 mmol/L (ref 22–32)
Calcium: 9.1 mg/dL (ref 8.9–10.3)
Chloride: 101 mmol/L (ref 98–111)
Creatinine, Ser: 0.65 mg/dL (ref 0.44–1.00)
GFR, Estimated: >60 mL/min (ref >60)
Glucose, Bld: 96 mg/dL (ref 70–99)
Potassium: 3.4 mmol/L — ABNORMAL LOW (ref 3.5–5.1)
Sodium: 136 mmol/L (ref 135–145)
Total Bilirubin: 0.4 mg/dL (ref 0.3–1.2)
Total Protein: 7.2 g/dL (ref 6.5–8.1)

## 2022-02-25 LAB — PROTIME-INR
INR: 1.1 (ref 0.8–1.2)
Prothrombin Time: 13.8 seconds (ref 11.4–15.2)

## 2022-03-02 ENCOUNTER — Other Ambulatory Visit (HOSPITAL_COMMUNITY): Payer: Medicare Other

## 2022-03-05 ENCOUNTER — Encounter: Payer: Self-pay | Admitting: *Deleted

## 2022-03-05 ENCOUNTER — Ambulatory Visit (HOSPITAL_COMMUNITY)
Admission: RE | Admit: 2022-03-05 | Discharge: 2022-03-05 | Disposition: A | Discharge status: Home / Self Care | Payer: Medicare Other | Attending: Gastroenterology | Admitting: Gastroenterology

## 2022-03-05 ENCOUNTER — Encounter (HOSPITAL_COMMUNITY): Payer: Self-pay | Admitting: Certified Registered Nurse Anesthetist

## 2022-03-05 ENCOUNTER — Telehealth (INDEPENDENT_AMBULATORY_CARE_PROVIDER_SITE_OTHER): Payer: Self-pay | Admitting: Gastroenterology

## 2022-03-05 MED ORDER — PROPOFOL 500 MG/50ML IV EMUL
INTRAVENOUS | Status: AC
  Filled 2022-03-05: qty 50

## 2022-03-05 MED ORDER — LIDOCAINE HCL (PF) 2 % IJ SOLN
INTRAMUSCULAR | Status: AC
  Filled 2022-03-05: qty 5

## 2022-03-05 NOTE — Telephone Encounter (Signed)
Patient went today for an EGD - when she got there she was coughing and all so endo decided to cancel her procedure - was told to call and reschedule - please advise case worker [name] - ph# [phone] ext [phone]

## 2022-03-05 NOTE — Telephone Encounter (Signed)
Spoke with [name] and she said pt was coughing pretty bad, so procedure was cancelled. Pt has been rescheduled 04/28/22 at 10:30 am. New instructions mailed to pt.

## 2022-03-05 NOTE — OR Nursing (Signed)
Respiratory issues procedure cancelled patient to call offce and reschedule.  Caregiver made aware and verbalized understanding

## 2022-04-24 ENCOUNTER — Encounter (HOSPITAL_COMMUNITY)
Admit: 2022-04-24 | Discharge: 2022-04-24 | Disposition: A | Discharge status: Home / Self Care | Payer: 59 | Attending: Gastroenterology | Admitting: Gastroenterology

## 2022-04-27 ENCOUNTER — Encounter: Payer: Self-pay | Admitting: *Deleted

## 2022-04-28 ENCOUNTER — Encounter (HOSPITAL_COMMUNITY): Payer: Self-pay | Admitting: Gastroenterology

## 2022-04-28 ENCOUNTER — Encounter (HOSPITAL_COMMUNITY)
Admission: RE | Disposition: A | Discharge status: Home / Self Care | Payer: Self-pay | Source: Home / Self Care | Attending: Gastroenterology

## 2022-04-28 ENCOUNTER — Other Ambulatory Visit: Payer: Self-pay

## 2022-04-28 ENCOUNTER — Ambulatory Visit (HOSPITAL_COMMUNITY)
Admission: RE | Admit: 2022-04-28 | Discharge: 2022-04-28 | Disposition: A | Discharge status: Home / Self Care | Payer: 59 | Attending: Gastroenterology | Admitting: Gastroenterology

## 2022-04-28 ENCOUNTER — Ambulatory Visit (HOSPITAL_COMMUNITY): Payer: 59 | Admitting: Anesthesiology

## 2022-04-28 ENCOUNTER — Ambulatory Visit (HOSPITAL_BASED_OUTPATIENT_CLINIC_OR_DEPARTMENT_OTHER): Payer: 59 | Admitting: Anesthesiology

## 2022-04-28 DIAGNOSIS — Z7984 Long term (current) use of oral hypoglycemic drugs: Secondary | ICD-10-CM | POA: Diagnosis not present

## 2022-04-28 DIAGNOSIS — K449 Diaphragmatic hernia without obstruction or gangrene: Secondary | ICD-10-CM | POA: Diagnosis not present

## 2022-04-28 DIAGNOSIS — R1310 Dysphagia, unspecified: Secondary | ICD-10-CM | POA: Insufficient documentation

## 2022-04-28 DIAGNOSIS — F17210 Nicotine dependence, cigarettes, uncomplicated: Secondary | ICD-10-CM | POA: Diagnosis not present

## 2022-04-28 DIAGNOSIS — E039 Hypothyroidism, unspecified: Secondary | ICD-10-CM | POA: Insufficient documentation

## 2022-04-28 DIAGNOSIS — I252 Old myocardial infarction: Secondary | ICD-10-CM

## 2022-04-28 DIAGNOSIS — I2510 Atherosclerotic heart disease of native coronary artery without angina pectoris: Secondary | ICD-10-CM | POA: Insufficient documentation

## 2022-04-28 DIAGNOSIS — R1319 Other dysphagia: Secondary | ICD-10-CM | POA: Diagnosis not present

## 2022-04-28 DIAGNOSIS — I4891 Unspecified atrial fibrillation: Secondary | ICD-10-CM | POA: Insufficient documentation

## 2022-04-28 DIAGNOSIS — K219 Gastro-esophageal reflux disease without esophagitis: Secondary | ICD-10-CM | POA: Insufficient documentation

## 2022-04-28 DIAGNOSIS — I509 Heart failure, unspecified: Secondary | ICD-10-CM | POA: Insufficient documentation

## 2022-04-28 DIAGNOSIS — J449 Chronic obstructive pulmonary disease, unspecified: Secondary | ICD-10-CM | POA: Insufficient documentation

## 2022-04-28 DIAGNOSIS — K754 Autoimmune hepatitis: Secondary | ICD-10-CM | POA: Diagnosis not present

## 2022-04-28 DIAGNOSIS — E1165 Type 2 diabetes mellitus with hyperglycemia: Secondary | ICD-10-CM

## 2022-04-28 DIAGNOSIS — K222 Esophageal obstruction: Secondary | ICD-10-CM | POA: Diagnosis not present

## 2022-04-28 DIAGNOSIS — E119 Type 2 diabetes mellitus without complications: Secondary | ICD-10-CM | POA: Diagnosis not present

## 2022-04-28 DIAGNOSIS — Z8673 Personal history of transient ischemic attack (TIA), and cerebral infarction without residual deficits: Secondary | ICD-10-CM | POA: Diagnosis not present

## 2022-04-28 DIAGNOSIS — I110 Hypertensive heart disease with heart failure: Secondary | ICD-10-CM | POA: Insufficient documentation

## 2022-04-28 DIAGNOSIS — F418 Other specified anxiety disorders: Secondary | ICD-10-CM | POA: Insufficient documentation

## 2022-04-28 DIAGNOSIS — E785 Hyperlipidemia, unspecified: Secondary | ICD-10-CM | POA: Insufficient documentation

## 2022-04-28 HISTORY — PX: ESOPHAGEAL DILATION: SHX303

## 2022-04-28 HISTORY — PX: ESOPHAGOGASTRODUODENOSCOPY (EGD) WITH PROPOFOL: SHX5813

## 2022-04-28 HISTORY — PX: BIOPSY: SHX5522

## 2022-04-28 SURGERY — ESOPHAGOGASTRODUODENOSCOPY (EGD) WITH PROPOFOL
Anesthesia: Monitor Anesthesia Care

## 2022-04-28 SURGERY — ESOPHAGOGASTRODUODENOSCOPY (EGD) WITH PROPOFOL
Anesthesia: General

## 2022-04-28 MED ORDER — GLYCOPYRROLATE PF 0.2 MG/ML IJ SOSY
PREFILLED_SYRINGE | INTRAMUSCULAR | Status: DC | PRN
  Administered 2022-04-28: .2 mg via INTRAVENOUS

## 2022-04-28 MED ORDER — LACTATED RINGERS IV SOLN: INTRAVENOUS | Status: DC | PRN

## 2022-04-28 MED ORDER — PROPOFOL 10 MG/ML IV BOLUS
INTRAVENOUS | Status: DC | PRN
  Administered 2022-04-28: 60 mg via INTRAVENOUS
  Administered 2022-04-28 (×4): 20 mg via INTRAVENOUS

## 2022-04-28 MED ORDER — LIDOCAINE HCL (CARDIAC) PF 100 MG/5ML IV SOSY
PREFILLED_SYRINGE | INTRAVENOUS | Status: DC | PRN
  Administered 2022-04-28: 100 mg via INTRATRACHEAL

## 2022-04-28 NOTE — Op Note (Signed)
[facility] Patient Name: [name] Procedure Date: 04/28/2022 9:21 AM MRN: [id number] Date of Birth: Mar 07, 1951 Attending MD: [name] , , [id number] CSN: [id number] Age: 72 Admit Type: Outpatient Procedure:                Upper GI endoscopy Indications:              Dysphagia Providers:                [name], Rosina Lowenstein, RN, Aram Candela Referring MD:              Medicines:                Monitored Anesthesia Care Complications:            No immediate complications. Estimated Blood Loss:     Estimated blood loss: none. Procedure:                Pre-Anesthesia Assessment:                           - Prior to the procedure, a History and Physical                            was performed, and patient medications, allergies                            and sensitivities were reviewed. The patient's                            tolerance of previous anesthesia was reviewed.                           - The risks and benefits of the procedure and the                            sedation options and risks were discussed with the                            patient. All questions were answered and informed                            consent was obtained.                           - ASA Grade Assessment: III - A patient with severe                            systemic disease.                           After obtaining informed consent, the endoscope was                            passed under direct vision. Throughout the                            procedure, the patient's blood pressure, pulse,  and                            oxygen saturations were monitored continuously. The                            [id number] scope was introduced through the                            mouth, and advanced to the second part of duodenum.                            The upper GI endoscopy was accomplished without                            difficulty. The patient tolerated  the procedure                            well. Scope In: 9:47:02 AM Scope Out: 9:55:26 AM Total Procedure Duration: 0 hours 8 minutes 24 seconds  Findings:      No endoscopic abnormality was evident in the esophagus to explain the       patient's complaint of dysphagia. It was decided, however, to proceed       with dilation of the entire esophagus. A guidewire was placed and the       scope was withdrawn. Dilation was performed with a Savary dilator with       no resistance at 18 mm. The dilation site was examined following       endoscope reinsertion and showed no change. Biopsies were obtained from       the proximal and distal esophagus with cold forceps for histology.      A 2 cm hiatal hernia was present.      The stomach was normal.      The examined duodenum was normal. Impression:               - No endoscopic esophageal abnormality to explain                            patient's dysphagia. Esophagus dilated. Biopsied.                           - 2 cm hiatal hernia.                           - Normal stomach.                           - Normal examined duodenum. Moderate Sedation:      Per Anesthesia Care Recommendation:           - Discharge patient to home (ambulatory).                           - Resume previous diet.                           - Continue present medications.                           -  If persistent dysphagia, will proceed with                            esophageal manometry.                           - Restart Plavix today. Procedure Code(s):        --- Professional ---                           [phone], Esophagogastroduodenoscopy, flexible,                            transoral; with insertion of guide wire followed by                            passage of dilator(s) through esophagus over guide                            wire                           [id number], 63, Esophagogastroduodenoscopy, flexible,                            transoral; with biopsy,  single or multiple Diagnosis Code(s):        --- Professional ---                           R13.10, Dysphagia, unspecified                           K44.9, Diaphragmatic hernia without obstruction or                            gangrene CPT copyright 2022 American Medical Association. All rights reserved. The codes documented in this report are preliminary and upon coder review may  be revised to meet current compliance requirements. [name], MD [name],  04/28/2022 10:03:30 AM This report has been signed electronically. Number of Addenda: 0

## 2022-04-28 NOTE — Transfer of Care (Signed)
Immediate Anesthesia Transfer of Care Note  Patient: [name]  Procedure(s) Performed: ESOPHAGOGASTRODUODENOSCOPY (EGD) WITH PROPOFOL ESOPHAGEAL DILATION BIOPSY  Patient Location: PACU  Anesthesia Type:General  Level of Consciousness: awake, alert , oriented, and patient cooperative  Airway & Oxygen Therapy: Patient Spontanous Breathing  Post-op Assessment: Report given to RN, Post -op Vital signs reviewed and stable, and Patient moving all extremities X 4  Post vital signs: Reviewed and stable  Last Vitals:  Vitals Value Taken Time  BP 93/60 04/28/22 1000  Temp 36.7 C 04/28/22 1000  Pulse 81 04/28/22 1000  Resp 19 04/28/22 1000  SpO2 96 % 04/28/22 1000    Last Pain:  Vitals:   04/28/22 1000  TempSrc: Oral  PainSc: 5       Patients Stated Pain Goal: 5 ([id number])  Complications: No notable events documented.

## 2022-04-28 NOTE — Anesthesia Preprocedure Evaluation (Signed)
Anesthesia Evaluation  Patient identified by MRN, date of birth, ID band Patient awake    Reviewed: Allergy & Precautions, H&P , NPO status , Patient's Chart, lab work & pertinent test results, reviewed documented beta blocker date and time   Airway Mallampati: II  TM Distance: >3 FB Neck ROM: full    Dental no notable dental hx.    Pulmonary neg pulmonary ROS, COPD, Current Smoker   Pulmonary exam normal breath sounds clear to auscultation       Cardiovascular Exercise Tolerance: Good hypertension, + CAD, + Past MI and +CHF  negative cardio ROS + dysrhythmias + Valvular Problems/Murmurs  Rhythm:regular Rate:Normal     Neuro/Psych  Headaches PSYCHIATRIC DISORDERS Anxiety Depression    CVA negative neurological ROS  negative psych ROS   GI/Hepatic negative GI ROS, Neg liver ROS, hiatal hernia,GERD  ,,(+) Hepatitis -  Endo/Other  negative endocrine ROSdiabetesHypothyroidism    Renal/GU Renal diseasenegative Renal ROS  negative genitourinary   Musculoskeletal   Abdominal   Peds  Hematology negative hematology ROS (+)   Anesthesia Other Findings   Reproductive/Obstetrics negative OB ROS                             Anesthesia Physical Anesthesia Plan  ASA: 3  Anesthesia Plan: General   Post-op Pain Management:    Induction:   PONV Risk Score and Plan: Propofol infusion  Airway Management Planned:   Additional Equipment:   Intra-op Plan:   Post-operative Plan:   Informed Consent: I have reviewed the patients History and Physical, chart, labs and discussed the procedure including the risks, benefits and alternatives for the proposed anesthesia with the patient or authorized representative who has indicated his/her understanding and acceptance.     Dental Advisory Given  Plan Discussed with: CRNA  Anesthesia Plan Comments:        Anesthesia Quick Evaluation

## 2022-04-28 NOTE — H&P (Signed)
[name] is an 72 y.o. female.   Chief Complaint: dysphagia HPI: [name] is a 72 y.o. 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  Reports that she has presented recurrent dysphagia.  Speech and swallow evaluated her and did not have any oropharyngeal dysphagia.  Has had recurrent dysphagia with solids and liquids.  No heartburn.  No odynophagia.  Past Medical History:  Diagnosis Date   Anxiety    Arthritis    "hands, arms, back, neck, knees, fingers" (01/21/2016)   Atrial fibrillation (HCC)    ASA daiy; came off Coumadin shortly after stroke   Breast cancer, left breast ([place]) 1970s; 2015   Cancer of skin of leg    BLE   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HCC)    left   Chronic lower back pain    Chronic pain    COPD (chronic obstructive pulmonary disease) (HCC)    Coronary artery disease    CVA (cerebral vascular accident) ([place]) pt denies right brain CVA   Right brain secondary to right internal carotid artery dissection for which 2 stents were placed;F/u cerebral angiography in 05/2008-minimal plaque; healing of previously identified left internal carotid artery dissection   Depression    GERD (gastroesophageal reflux disease)    Heart murmur    Hepatic cirrhosis ([place]) 12/20/2017   Hepatitis    History of blood transfusion    "related to spleen"   History of hiatal hernia    History of kidney stones    Hyperlipidemia    Hypertension    Hypothyroidism    Kidney stone    Melanoma of forearm, left ([place])    Migraine    "a few migraines/year" (01/21/2016)   NSTEMI (non-ST elevated myocardial infarction) ([place]) 01/21/2016   Archie Endo 01/21/2016   Obesity    Stroke ([place]) 04/2003    2 carotid artery stents in place; small left parietal and left hemispheric CVA.Phillis Knack 01/21/2016   Tobacco use disorder    50 pack years; questionably discontinued in 2010   Wears glasses     Past Surgical History:  Procedure Laterality Date   ABDOMINAL HYSTERECTOMY  1978   APPENDECTOMY     BREAST BIOPSY Left 1970s; 2015   BREAST LUMPECTOMY Left 1970s   BREAST LUMPECTOMY WITH NEEDLE LOCALIZATION Left 07/07/2013   Procedure: BREAST LUMPECTOMY WITH NEEDLE LOCALIZATION;  Surgeon: Merrie Roof, MD;  Location: [facility];  Service: General;  Laterality: Left;   CARDIAC CATHETERIZATION N/A 01/22/2016   Procedure: Left Heart Cath and Coronary Angiography;  Surgeon: Peter M Martinique, MD;  Location: [facility];  Service: Cardiovascular;  Laterality: N/A;   CARDIAC CATHETERIZATION N/A 01/22/2016   Procedure: Coronary Stent Intervention;  Surgeon: Peter M Martinique, MD;  Location: [facility];  Service: Cardiovascular;  Laterality: N/A;   CAROTID STENT Left 04/2003   small left parietal and left hemispheric CVA/notes 07/29/2010   CHOLECYSTECTOMY OPEN     COLONOSCOPY  2011   COLONOSCOPY N/A 05/10/2019   Procedure: COLONOSCOPY;  Surgeon: Rogene Houston, MD;  [facility];  Service: Endoscopy;  Laterality: N/A;  1:45   CORONARY ATHERECTOMY N/A 12/29/2019   Procedure: CORONARY ATHERECTOMY;  Surgeon: Jettie Booze, MD;  Location: [facility];  Service: Cardiovascular;  Laterality: N/A;  Prox RCA   CORONARY STENT INTERVENTION N/A 12/29/2019   Procedure: CORONARY STENT INTERVENTION;  Surgeon: Jettie Booze, MD;  Location: [facility];  Service: Cardiovascular;  Laterality: N/A;  Prox RCA   ESOPHAGOGASTRODUODENOSCOPY  08/06/2011   Procedure: ESOPHAGOGASTRODUODENOSCOPY (EGD);  Surgeon: Rogene Houston, MD;  [facility];  Service: Endoscopy;  Laterality: N/A;   ESOPHAGOGASTRODUODENOSCOPY N/A 05/10/2019   Procedure:  ESOPHAGOGASTRODUODENOSCOPY (EGD);  Surgeon: Rogene Houston, MD;  [facility];  Service: Endoscopy;  Laterality: N/A;   FLEXIBLE SIGMOIDOSCOPY  08/06/2011   Procedure: FLEXIBLE SIGMOIDOSCOPY;  Surgeon: Rogene Houston, MD;  [facility];  Service: Endoscopy;  Laterality: N/A;   INGUINAL HERNIA REPAIR Left 1970s   INTRAVASCULAR ULTRASOUND/IVUS N/A 12/29/2019   Procedure: Intravascular Ultrasound/IVUS;  Surgeon: Jettie Booze, MD;  Location: [facility];  Service: Cardiovascular;  Laterality: N/A;   IR ANGIO INTRA EXTRACRAN SEL COM CAROTID INNOMINATE BILAT MOD SED  11/01/2020   IR ANGIO VERTEBRAL SEL VERTEBRAL BILAT MOD SED  11/01/2020   IR RADIOLOGIST EVAL & MGMT  09/23/2020   IR US GUIDE VASC ACCESS RIGHT  11/01/2020   LARYNX SURGERY  1970s   Polyps excised   LEFT HEART CATH AND CORONARY ANGIOGRAPHY N/A 12/29/2019   Procedure: LEFT HEART CATH AND CORONARY ANGIOGRAPHY;  Surgeon: Jettie Booze, MD;  Location: [facility];  Service: Cardiovascular;  Laterality: N/A;   MALONEY DILATION  05/10/2019   Procedure: Venia Minks DILATION;  Surgeon: Rogene Houston, MD;  [facility];  Service: Endoscopy;;   MELANOMA EXCISION Left ~ 2016   forearm   POLYPECTOMY  05/10/2019   Procedure: POLYPECTOMY;  Surgeon: Rogene Houston, MD;  [facility];  Service: Endoscopy;;  colon   SPLENECTOMY, 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   spontaneous rupture   TEMPORARY PACEMAKER N/A 12/29/2019   Procedure: TEMPORARY PACEMAKER;  Surgeon: Jettie Booze, MD;  Location: [facility];  Service: Cardiovascular;  Laterality: N/A;    Family History  Problem Relation Age of Onset   Heart failure Mother    Cancer Father    Heart failure Father    Cancer Sister    Dementia Sister    Heart disease Other    Arthritis Other    Cancer Other    Diabetes Other    Kidney disease Other    Cancer Sister    Heart failure Brother    Social History:  reports that she  has been smoking cigarettes. She has a 20.00 pack-year smoking history. She has been exposed to tobacco smoke. She has never used smokeless tobacco. She reports that she does not drink alcohol and does not use drugs.  Allergies:  Allergies  Allergen Reactions   Gabapentin Nausea And Vomiting   Prednisone Palpitations   Tape Other (See Comments)    Causes blisters to form   Latex Rash and Other (See Comments)    Blisters    Facility-Administered Medications Prior to Admission  Medication Dose Route Frequency Provider Last Rate Last Admin   sodium chloride flush (NS) 0.9 % injection 3 mL  3 mL Intravenous Q12H Branch, Alphonse Guild, MD       Medications Prior to Admission  Medication Sig Dispense Refill   albuterol (PROVENTIL HFA;VENTOLIN HFA) 108 (90 Base) MCG/ACT inhaler Inhale 2 puffs into the lungs every 4 (four) hours as needed for wheezing or shortness of breath.  albuterol (PROVENTIL) (2.5 MG/3ML) 0.083% nebulizer solution Take 2.5 mg by nebulization every 6 (six) hours as needed for wheezing or shortness of breath.     ALPRAZolam (XANAX) 1 MG tablet Take 0.5-1 mg by mouth at bedtime as needed for sleep.     amLODipine (NORVASC) 5 MG tablet TAKE 1 TABLET BY MOUTH ONCE DAILY. 90 tablet 2   aspirin EC 81 MG tablet Take 1 tablet (81 mg total) by mouth daily.     aspirin-acetaminophen-caffeine (EXCEDRIN MIGRAINE) 250-250-65 MG tablet Take 1 tablet by mouth daily as needed for headache.     atorvastatin (LIPITOR) 80 MG tablet TAKE ONE TABLET BY MOUTH DAILY AT 6 P.M. 90 tablet 3   azaTHIOprine (IMURAN) 50 MG tablet Take 1 tablet (50 mg total) by mouth daily. 90 tablet 1   cetirizine (ZYRTEC) 10 MG tablet Take 10 mg by mouth daily.     Cholecalciferol (VITAMIN D3 SUPER STRENGTH) 50 MCG (2000 UT) CAPS Take 2,000 Units by mouth daily.     clopidogrel (PLAVIX) 75 MG tablet TAKE (1) TABLET BY MOUTH ONCE DAILY. 90 tablet 1   clorazepate (TRANXENE) 7.5 MG tablet Take 7.5 mg by mouth 3 (three)  times daily as needed for anxiety.      docusate sodium (COLACE) 100 MG capsule Take 200 mg by mouth at bedtime.     famotidine (PEPCID) 20 MG tablet Take 1 tablet (20 mg total) by mouth at bedtime. 30 tablet 11   furosemide (LASIX) 40 MG tablet Take 40 mg by mouth daily as needed for fluid (leg/hand swelling.).     HYDROcodone-acetaminophen (NORCO) 10-325 MG tablet Take 1 tablet by mouth 3 (three) times daily as needed for moderate pain.      isosorbide mononitrate (IMDUR) 120 MG 24 hr tablet TAKE ONE TABLET BY MOUTH ONCE DAILY. 90 tablet 2   JARDIANCE 10 MG TABS tablet Take 10 mg by mouth every morning.     Ketotifen Fumarate (ALLERGY EYE DROPS OP) Place 1 drop into both eyes daily as needed (allergies).     levothyroxine (SYNTHROID, LEVOTHROID) 50 MCG tablet Take 50 mcg by mouth daily before breakfast.      losartan (COZAAR) 25 MG tablet Take 1 tablet (25 mg total) by mouth daily. 30 tablet 6   nitroGLYCERIN (NITROSTAT) 0.4 MG SL tablet Place 0.4 mg under the tongue every 5 (five) minutes as needed for chest pain.       ondansetron (ZOFRAN) 4 MG tablet Take 1 tablet (4 mg total) by mouth 3 (three) times daily as needed. 30 tablet 1   pantoprazole (PROTONIX) 40 MG tablet TAKE 1 TABLET BY MOUTH ONCE A DAY. 90 tablet 1    No results found for this or any previous visit (from the past 48 hour(s)). No results found.  Review of Systems  HENT:  Positive for trouble swallowing.   All other systems reviewed and are negative.   Blood pressure 129/82, pulse 78, temperature 98.6 F (37 C), temperature source Oral, resp. rate 16, height 5' 3"$  (1.6 m), weight 77.1 kg, SpO2 93 %. Physical Exam  GENERAL: The patient is AO x3, in no acute distress. HEENT: Head is normocephalic and atraumatic. EOMI are intact. Mouth is well hydrated and without lesions. NECK: Supple. No masses LUNGS: Clear to auscultation. No presence of rhonchi/wheezing/rales. Adequate chest expansion HEART: RRR, normal s1 and  s2. ABDOMEN: Soft, nontender, no guarding, no peritoneal signs, and nondistended. BS +. No masses. EXTREMITIES: Without any cyanosis, clubbing, rash,  lesions or edema. NEUROLOGIC: AOx3, no focal motor deficit. SKIN: no jaundice, no rashes  Assessment/Plan [name] is a 72 y.o. 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  Will proceed with EGD.  Harvel Quale, MD 04/28/2022, 9:35 AM

## 2022-04-28 NOTE — Discharge Instructions (Addendum)
You are being discharged to home.  Resume your previous diet.  Continue your present medications.  Restart Plavix today.

## 2022-04-29 LAB — SURGICAL PATHOLOGY

## 2022-05-01 NOTE — Anesthesia Postprocedure Evaluation (Signed)
Anesthesia Post Note  Patient: MERYSSA BRANDER  Procedure(s) Performed: ESOPHAGOGASTRODUODENOSCOPY (EGD) WITH PROPOFOL ESOPHAGEAL DILATION BIOPSY  Patient location during evaluation: Phase II Anesthesia Type: General Level of consciousness: awake Pain management: pain level controlled Vital Signs Assessment: post-procedure vital signs reviewed and stable Respiratory status: spontaneous breathing and respiratory function stable Cardiovascular status: blood pressure returned to baseline and stable Postop Assessment: no headache and no apparent nausea or vomiting Anesthetic complications: no Comments: Late entry   No notable events documented.   Last Vitals:  Vitals:   04/28/22 0909 04/28/22 1000  BP: 129/82 93/60  Pulse: 78 81  Resp: 16 19  Temp: 37 C 36.7 C  SpO2: 93% 96%    Last Pain:  Vitals:   04/28/22 1000  TempSrc: Oral  PainSc: [place]

## 2022-05-04 ENCOUNTER — Encounter (HOSPITAL_COMMUNITY): Payer: Self-pay | Admitting: Gastroenterology

## 2022-05-14 ENCOUNTER — Encounter: Payer: Self-pay | Admitting: Radiology

## 2022-05-26 ENCOUNTER — Emergency Department (HOSPITAL_COMMUNITY): Payer: 59

## 2022-05-26 ENCOUNTER — Emergency Department (HOSPITAL_COMMUNITY)
Admission: EM | Admit: 2022-05-26 | Discharge: 2022-05-26 | Disposition: A | Discharge status: Home / Self Care | Payer: 59 | Attending: Emergency Medicine | Admitting: Emergency Medicine

## 2022-05-26 ENCOUNTER — Encounter (HOSPITAL_COMMUNITY): Payer: Self-pay

## 2022-05-26 ENCOUNTER — Other Ambulatory Visit: Payer: Self-pay

## 2022-05-26 DIAGNOSIS — Z7982 Long term (current) use of aspirin: Secondary | ICD-10-CM | POA: Insufficient documentation

## 2022-05-26 DIAGNOSIS — Z91040 Latex allergy status: Secondary | ICD-10-CM | POA: Insufficient documentation

## 2022-05-26 DIAGNOSIS — R072 Precordial pain: Secondary | ICD-10-CM | POA: Diagnosis not present

## 2022-05-26 DIAGNOSIS — M25522 Pain in left elbow: Secondary | ICD-10-CM | POA: Insufficient documentation

## 2022-05-26 DIAGNOSIS — Z79899 Other long term (current) drug therapy: Secondary | ICD-10-CM | POA: Diagnosis not present

## 2022-05-26 DIAGNOSIS — M79602 Pain in left arm: Secondary | ICD-10-CM

## 2022-05-26 DIAGNOSIS — E876 Hypokalemia: Secondary | ICD-10-CM | POA: Insufficient documentation

## 2022-05-26 LAB — BASIC METABOLIC PANEL
Anion gap: 2 — ABNORMAL LOW (ref 5–15)
BUN: 12 mg/dL (ref 8–23)
CO2: 30 mmol/L (ref 22–32)
Calcium: 8.4 mg/dL — ABNORMAL LOW (ref 8.9–10.3)
Chloride: 105 mmol/L (ref 98–111)
Creatinine, Ser: 0.63 mg/dL (ref 0.44–1.00)
GFR, Estimated: >60 mL/min (ref >60)
Glucose, Bld: 102 mg/dL — ABNORMAL HIGH (ref 70–99)
Potassium: 3.4 mmol/L — ABNORMAL LOW (ref 3.5–5.1)
Sodium: 137 mmol/L (ref 135–145)

## 2022-05-26 LAB — CBC
HCT: 41.9 % (ref 36.0–46.0)
Hemoglobin: 13.8 g/dL (ref 12.0–15.0)
MCH: 29.6 pg (ref 26.0–34.0)
MCHC: 32.9 g/dL (ref 30.0–36.0)
MCV: 89.9 fL (ref 80.0–100.0)
Platelets: 317 10*3/uL (ref 150–400)
RBC: 4.66 MIL/uL (ref 3.87–5.11)
RDW: 13.9 % (ref 11.5–15.5)
WBC: 8.2 10*3/uL (ref 4.0–10.5)
nRBC: 0 % (ref 0.0–0.2)

## 2022-05-26 LAB — TROPONIN I (HIGH SENSITIVITY)
Troponin I (High Sensitivity): 5 ng/L (ref <18)
Troponin I (High Sensitivity): 7 ng/L (ref <18)

## 2022-05-26 MED ORDER — ACETAMINOPHEN 500 MG PO TABS
1000.0000 mg | ORAL_TABLET | Freq: Once | ORAL | Status: AC
  Administered 2022-05-26: 1000 mg via ORAL
  Filled 2022-05-26: qty 2

## 2022-05-26 MED ORDER — POTASSIUM CHLORIDE CRYS ER 20 MEQ PO TBCR
40.0000 meq | EXTENDED_RELEASE_TABLET | Freq: Once | ORAL | Status: AC
  Administered 2022-05-26: 40 meq via ORAL
  Filled 2022-05-26: qty 2

## 2022-05-26 MED ORDER — TRAMADOL HCL 50 MG PO TABS
50.0000 mg | ORAL_TABLET | Freq: Once | ORAL | Status: AC
  Administered 2022-05-26: 50 mg via ORAL
  Filled 2022-05-26: qty 1

## 2022-05-26 NOTE — Discharge Instructions (Addendum)
It was our pleasure to provide your ER care today - we hope that you feel better.  Take acetaminophen or ibuprofen as need.  Follow up closely with cardiologist in the next 1-2 weeks.  Your potassium level is slightly low - eat plenty of fruits and vegetables and follow up with your doctor.   Return to ER if worse, new symptoms, fevers, recurrent/persistent chest pain, trouble breathing, arm swelling/redness or severe pain, or other emergency concern.

## 2022-05-26 NOTE — ED Provider Notes (Signed)
[facility] Provider Note   CSN: [id number] Arrival date & time: 05/26/22  [id number]     History  Chief Complaint  Patient presents with   Arm Pain    [name] is a 72 y.o. female.  Pt c/o pain to left arm/above and below left elbow area, in past day. Symptoms at rest, constant, dull, non radiating. Denies injury or strain to area. No swelling to arm or any skin changes/redness. No neck or radicular pain. No chest pain or discomfort. No sob or unusual doe/fatigue. States did have some left arm pain w prior cardiac issues, so was concerned.   The history is provided by the patient, medical records and the EMS personnel.  Arm Pain Pertinent negatives include no chest pain, no abdominal pain, no headaches and no shortness of breath.       Home Medications Prior to Admission medications   Medication Sig Start Date End Date Taking? Authorizing Provider  albuterol (PROVENTIL HFA;VENTOLIN HFA) 108 (90 Base) MCG/ACT inhaler Inhale 2 puffs into the lungs every 4 (four) hours as needed for wheezing or shortness of breath.   Yes [provider]  albuterol (PROVENTIL) (2.5 MG/3ML) 0.083% nebulizer solution Take 2.5 mg by nebulization every 6 (six) hours as needed for wheezing or shortness of breath.   Yes [provider]  ALPRAZolam Duanne Moron) 1 MG tablet Take 0.5-1 mg by mouth at bedtime as needed for sleep. 12/04/21  Yes [provider]  amLODipine (NORVASC) 5 MG tablet TAKE 1 TABLET BY MOUTH ONCE DAILY. 09/08/21  Yes BranchAlphonse Guild, MD  aspirin EC 81 MG tablet Take 1 tablet (81 mg total) by mouth daily. 05/11/19  Yes Rehman, Mechele Dawley, MD  aspirin-acetaminophen-caffeine (EXCEDRIN MIGRAINE) [phone] MG tablet Take 1 tablet by mouth daily as needed for headache.   Yes [provider]  atorvastatin (LIPITOR) 80 MG tablet TAKE ONE TABLET BY MOUTH DAILY AT 6 P.M. 11/03/21  Yes Branch, Alphonse Guild, MD  azaTHIOprine  (IMURAN) 50 MG tablet Take 1 tablet (50 mg total) by mouth daily. 01/13/22  Yes Carlan, Chelsea L, NP  cetirizine (ZYRTEC) 10 MG tablet Take 10 mg by mouth daily.   Yes [provider]  Cholecalciferol (VITAMIN D3 SUPER STRENGTH) 50 MCG (2000 UT) CAPS Take 2,000 Units by mouth daily.   Yes [provider]  clopidogrel (PLAVIX) 75 MG tablet TAKE (1) TABLET BY MOUTH ONCE DAILY. 12/11/21  Yes Branch, Alphonse Guild, MD  clorazepate (TRANXENE) 7.5 MG tablet Take 7.5 mg by mouth 3 (three) times daily as needed for anxiety.  01/03/16  Yes [provider]  famotidine (PEPCID) 20 MG tablet Take 1 tablet (20 mg total) by mouth at bedtime. 10/09/19  Yes Laurine Blazer B, PA-C  fluticasone (FLONASE) 50 MCG/ACT nasal spray Place 2 sprays into both nostrils as needed for allergies. 03/23/22  Yes [provider]  HYDROcodone-acetaminophen (NORCO) 10-325 MG tablet Take 1 tablet by mouth every 4 (four) hours as needed for moderate pain.   Yes [provider]  isosorbide mononitrate (IMDUR) 120 MG 24 hr tablet TAKE ONE TABLET BY MOUTH ONCE DAILY. 09/09/21  Yes BranchAlphonse Guild, MD  JARDIANCE 10 MG TABS tablet Take 10 mg by mouth every morning. 05/13/21  Yes [provider]  Ketotifen Fumarate (ALLERGY EYE DROPS OP) Place 1 drop into both eyes daily as needed (allergies).   Yes [provider]  levothyroxine (SYNTHROID, LEVOTHROID) 50 MCG tablet Take  50 mcg by mouth daily before breakfast.  07/20/14  Yes [provider]  losartan (COZAAR) 25 MG tablet Take 1 tablet (25 mg total) by mouth daily. 12/12/21  Yes Branch, Alphonse Guild, MD  nitroGLYCERIN (NITROSTAT) 0.4 MG SL tablet Place 0.4 mg under the tongue every 5 (five) minutes as needed for chest pain.     Yes [provider]  ondansetron (ZOFRAN) 4 MG tablet Take 1 tablet (4 mg total) by mouth 3 (three) times daily as needed. 10/09/19  Yes Laurine Blazer B, PA-C  pantoprazole (PROTONIX) 40 MG tablet  TAKE 1 TABLET BY MOUTH ONCE A DAY. 01/14/22  Yes Carlan, Chelsea L, NP  docusate sodium (COLACE) 100 MG capsule Take 200 mg by mouth at bedtime. Patient not taking: Reported on 05/26/2022    [provider]  furosemide (LASIX) 40 MG tablet Take 40 mg by mouth daily as needed for fluid (leg/hand swelling.). Patient not taking: Reported on 05/26/2022    [provider]      Allergies    Gabapentin, Prednisone, Tape, and Latex    Review of Systems   Review of Systems  Constitutional:  Negative for chills and fever.  Eyes:  Negative for redness.  Respiratory:  Negative for cough and shortness of breath.   Cardiovascular:  Negative for chest pain and leg swelling.  Gastrointestinal:  Negative for abdominal pain, nausea and vomiting.  Genitourinary:  Negative for flank pain.  Musculoskeletal:  Negative for neck pain.  Skin:  Negative for rash.  Neurological:  Negative for weakness, numbness and headaches.  Hematological:  Does not bruise/bleed easily.  Psychiatric/Behavioral:  Negative for confusion.     Physical Exam Updated Vital Signs BP (!) 144/74   Pulse 73   Temp 98.3 F (36.8 C) (Oral)   Resp 15   Ht 1.6 m ('5\' 3"'$ )   Wt 77.1 kg   SpO2 94%   BMI 30.11 kg/m  Physical Exam Vitals and nursing note reviewed.  Constitutional:      Appearance: Normal appearance. She is well-developed.  HENT:     Head: Atraumatic.     Nose: Nose normal.     Mouth/Throat:     Mouth: Mucous membranes are moist.  Eyes:     General: No scleral icterus.    Conjunctiva/sclera: Conjunctivae normal.  Neck:     Trachea: No tracheal deviation.  Cardiovascular:     Rate and Rhythm: Normal rate and regular rhythm.     Pulses: Normal pulses.     Heart sounds: Normal heart sounds. No murmur heard.    No friction rub. No gallop.  Pulmonary:     Effort: Pulmonary effort is normal. No respiratory distress.     Breath sounds: Normal breath sounds.  Abdominal:     General: There is no  distension.     Palpations: Abdomen is soft.     Tenderness: There is no abdominal tenderness.  Musculoskeletal:        General: No swelling.     Cervical back: Normal range of motion and neck supple. No rigidity. No muscular tenderness.     Comments: C spine non tender, aligned, no pain w rom. Good rom left shoulder, elbow and wrist without pain. LUE of normal color and warmth. No swelling. Radial pulse 2+. No focal bony tenderness.   Skin:    General: Skin is warm and dry.     Findings: No rash.  Neurological:     Mental Status: She is alert.  Comments: Alert, speech normal. LUE nvi.Marland Kitchen   Psychiatric:        Mood and Affect: Mood normal.     ED Results / Procedures / Treatments   Labs (all labs ordered are listed, but only abnormal results are displayed) Results for orders placed or performed during the hospital encounter of [id number]  Basic metabolic panel  Result Value Ref Range   Sodium 137 135 - 145 mmol/L   Potassium 3.4 (L) 3.5 - 5.1 mmol/L   Chloride 105 98 - 111 mmol/L   CO2 30 22 - 32 mmol/L   Glucose, Bld 102 (H) 70 - 99 mg/dL   BUN 12 8 - 23 mg/dL   Creatinine, Ser 0.63 0.44 - 1.00 mg/dL   Calcium 8.4 (L) 8.9 - 10.3 mg/dL   GFR, Estimated >60 >60 mL/min   Anion gap 2 (L) 5 - 15  CBC  Result Value Ref Range   WBC 8.2 4.0 - 10.5 K/uL   RBC 4.66 3.87 - 5.11 MIL/uL   Hemoglobin 13.8 12.0 - 15.0 g/dL   HCT 41.9 36.0 - 46.0 %   MCV 89.9 80.0 - 100.0 fL   MCH 29.6 26.0 - 34.0 pg   MCHC 32.9 30.0 - 36.0 g/dL   RDW 13.9 11.5 - 15.5 %   Platelets 317 150 - 400 K/uL   nRBC 0.0 0.0 - 0.2 %  Troponin I (High Sensitivity)  Result Value Ref Range   Troponin I (High Sensitivity) 5 <18 ng/L  Troponin I (High Sensitivity)  Result Value Ref Range   Troponin I (High Sensitivity) 7 <18 ng/L   DG Chest Port 1 View  Result Date: 05/26/2022 CLINICAL DATA:  Chest and LEFT arm pain EXAM: PORTABLE CHEST 1 VIEW COMPARISON:  Portable exam 1019 hours compared to 07/08/2020  FINDINGS: Normal heart size, mediastinal contours, and pulmonary vascularity. Atherosclerotic calcification aorta. Emphysematous and bronchitic changes consistent with COPD. RIGHT apex scarring. Slight interstitial prominence in mid to lower lungs unchanged. No acute infiltrate, pleural effusion, or pneumothorax. Bones demineralized with minimal thoracic scoliosis. IMPRESSION: COPD changes with minimal chronic interstitial prominence of the mid to lower lungs. No acute abnormalities. Aortic Atherosclerosis (ICD10-I70.0) and Emphysema (ICD10-J43.9). Electronically Signed   By: Lavonia Dana M.D.   On: 05/26/2022 10:18     EKG EKG Interpretation  Date/Time:  Tuesday May 26 2022 09:36:49 EDT Ventricular Rate:  75 PR Interval:  189 QRS Duration: 97 QT Interval:  415 QTC Calculation: 464 R Axis:   19 Text Interpretation: Sinus rhythm Nonspecific T wave abnormality No significant change since last tracing Confirmed by Lajean Saver [phone]) on 05/26/2022 9:55:57 AM  Radiology DG Chest Port 1 View  Result Date: 05/26/2022 CLINICAL DATA:  Chest and LEFT arm pain EXAM: PORTABLE CHEST 1 VIEW COMPARISON:  Portable exam 1019 hours compared to 07/08/2020 FINDINGS: Normal heart size, mediastinal contours, and pulmonary vascularity. Atherosclerotic calcification aorta. Emphysematous and bronchitic changes consistent with COPD. RIGHT apex scarring. Slight interstitial prominence in mid to lower lungs unchanged. No acute infiltrate, pleural effusion, or pneumothorax. Bones demineralized with minimal thoracic scoliosis. IMPRESSION: COPD changes with minimal chronic interstitial prominence of the mid to lower lungs. No acute abnormalities. Aortic Atherosclerosis (ICD10-I70.0) and Emphysema (ICD10-J43.9). Electronically Signed   By: Lavonia Dana M.D.   On: 05/26/2022 10:18    Procedures Procedures    Medications Ordered in ED Medications  acetaminophen (TYLENOL) tablet 1,000 mg (1,000 mg Oral Given 05/26/22 1100)   traMADol (ULTRAM) tablet 50 mg (50  mg Oral Given 05/26/22 1100)  potassium chloride SA (KLOR-CON M) CR tablet 40 mEq (40 mEq Oral Given 05/26/22 1148)    ED Course/ Medical Decision Making/ A&P                             Medical Decision Making Problems Addressed: Hypokalemia: acute illness or injury Left arm pain: acute illness or injury with systemic symptoms that poses a threat to life or bodily functions  Amount and/or Complexity of Data Reviewed External Data Reviewed: notes. Labs: ordered. Decision-making details documented in ED Course. Radiology: ordered and independent interpretation performed. Decision-making details documented in ED Course. ECG/medicine tests: ordered and independent interpretation performed. Decision-making details documented in ED Course.  Risk OTC drugs. Prescription drug management. Decision regarding hospitalization.   Iv ns. Continuous pulse ox and cardiac monitoring. Labs ordered/sent. Imaging ordered.   Differential diagnosis includes ACS, msk pain, etc . Dispo decision including potential need for admission considered - will get labs and imaging and reassess.   Reviewed nursing notes and prior charts for additional history. External reports reviewed. Additional history from: EMS.   Cardiac monitor: sinus rhythm, rate 77.  Labs reviewed/interpreted by me - initial and delta trop normal - given constant, prolonged symptoms, felt not c/w acs.  K sl low. Kcl po.   Xrays reviewed/interpreted by me - no pna.   Acetaminophen po, ultram po for symptom relief.   Recheck, no cp or sob.   Pt appears stable for d/c.  Rec cardiology f/u.  Return precautions provided.             Final Clinical Impression(s) / ED Diagnoses Final diagnoses:  Precordial chest pain  Hypokalemia  Left arm pain    Rx / DC Orders ED Discharge Orders          Ordered    Ambulatory referral to Cardiology       Comments: If you have not heard from  the Cardiology office within the next 72 hours please call [phone].   05/26/22 1403              Lajean Saver, MD 05/26/22 1405

## 2022-05-26 NOTE — ED Triage Notes (Signed)
Reports around 2am started having left arm pain which was similar when she had her last heart attack. Reports took a nitro and it went away.  Reports woke up this am and it returned with nausea.  EMS gave '324mg'$  ASA.  Denies pain radiating anywhere or chest pain sob.  Just left arm pain and mild nausea.

## 2022-06-01 ENCOUNTER — Ambulatory Visit (INDEPENDENT_AMBULATORY_CARE_PROVIDER_SITE_OTHER): Payer: 59 | Admitting: Gastroenterology

## 2022-06-01 ENCOUNTER — Encounter (INDEPENDENT_AMBULATORY_CARE_PROVIDER_SITE_OTHER): Payer: Self-pay | Admitting: Gastroenterology

## 2022-06-01 VITALS — BP 116/74 | HR 81 | Temp 98.2°F | Ht 63.0 in | Wt 181.0 lb

## 2022-06-01 DIAGNOSIS — K754 Autoimmune hepatitis: Secondary | ICD-10-CM

## 2022-06-01 DIAGNOSIS — T17308A Unspecified foreign body in larynx causing other injury, initial encounter: Secondary | ICD-10-CM | POA: Insufficient documentation

## 2022-06-01 DIAGNOSIS — K743 Primary biliary cirrhosis: Secondary | ICD-10-CM | POA: Diagnosis not present

## 2022-06-01 MED ORDER — AZATHIOPRINE 50 MG PO TABS: 50.0000 mg | ORAL_TABLET | Freq: Every day | ORAL | 1 refills | Status: DC

## 2022-06-01 MED ORDER — URSODIOL 500 MG PO TABS: 500.0000 mg | ORAL_TABLET | Freq: Two times a day (BID) | ORAL | 3 refills | Status: DC

## 2022-06-01 NOTE — Progress Notes (Signed)
[name], M.D. Gastroenterology & Hepatology [facility] [address] [place], [place] [address]  Primary Care Physician: Lemmie Evens, MD [place] [address]  I will communicate my assessment and recommendations to the referring MD via EMR.  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   Last time seen in clinic was on 12/04/2021.  Patient was continued on azathioprine 50 mg every day and was referred to speech and swallow evaluation.  Patient was seen by Vivi Ferns on 12/25/2021 for evaluation of dysphagia.  She was given directions to take small sips of water and briefly holding the liquid in her mouth to allow adequate swallowing.  She was advised to follow-up with GI as the barium tablet remained in her esophagus.  As above due to this, she underwent an EGD on 04/28/2022.  No abnormalities were seen in the esophagus, which was empirically dilated with an 18 mm savory.Esophageal biopsies showed mild vascular congestion in the mid esophagus but otherwise were normal.  There was presence of a 2 cm hiatal hernia.  Normal stomach and duodenum.Cross-sectional abdominal imaging was ordered - an MRCP performed on 12/29/2021 which showed presence of CBD dilation up to 18 mm but no obstruction or choledocholithiasis elsewhere.  Last elastography on 12/29/2021 showed a low K PA of 4.5, ratio was 0.13.  States that she is still having episodes  of choking when swallowing water. She has tried to Investment banker, operational by speech and swallow recs. Has had 4 episodes of choking since she underwent dilation. No dysphagia to solids, heartburn or odynophagia.  Notably, patient is taking hydrocodone twice a day.  The patient denies having any nausea, vomiting, fever, chills, hematochezia, melena, hematemesis, abdominal distention, abdominal pain, diarrhea, jaundice, pruritus or weight loss.  Most recent surveillance labs from 02/25/2022 showed an INR 1.1, CMP with AST 21, ALT 16, alkaline phosphatase 58, total bili 0.4, albumin 3.9.  CBC with WBC 8.6, hemoglobin 14.7 and platelets 333.  Last EGD: As above Last Colonoscopy: 05/10/2019 - Two small polyps in the transverse colon and in the ascending colon. Biopsied. - Five small polyps in the descending colon, in the transverse colon and at the hepatic flexure, removed with a cold snare. Resected and retrieved. - Diverticulosis in the sigmoid colon. - External hemorrhoids.  Multiple tubular adenomas, recommended repeating a colonoscopy in 5 years per Dr. 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[facility])    left   Chronic lower back pain    Chronic pain    COPD (chronic obstructive pulmonary disease) ([facility])    Coronary artery disease    CVA (cerebral vascular accident) ([place]) pt denies right brain CVA   Right brain secondary to right internal carotid artery dissection  for which 2 stents were placed;F/u cerebral angiography in 05/2008-minimal plaque; healing of previously identified left internal carotid artery dissection   Depression    GERD (gastroesophageal reflux disease)    Heart murmur    Hepatic  cirrhosis ([place]) 12/20/2017   Hepatitis    History of blood transfusion    "related to spleen"   History of hiatal hernia    History of kidney stones    Hyperlipidemia    Hypertension    Hypothyroidism    Kidney stone    Melanoma of forearm, left ([facility])    Migraine    "a few migraines/year" (01/21/2016)   NSTEMI (non-ST elevated myocardial infarction) ([place]) 01/21/2016   Archie Endo 01/21/2016   Obesity    Stroke ([facility]) 04/2003   2 carotid artery stents in place; small left parietal and left hemispheric CVA.Phillis Knack 01/21/2016   Tobacco use disorder    50 pack years; questionably discontinued in 2010   Wears glasses     Past Surgical History: Past Surgical History:  Procedure Laterality Date   ABDOMINAL HYSTERECTOMY  1978   APPENDECTOMY     BIOPSY  04/28/2022   Procedure: BIOPSY;  Surgeon: Montez Morita, Quillian Quince, MD;  [facility];  Service: Gastroenterology;;   BREAST BIOPSY Left 1970s; 2015   BREAST LUMPECTOMY Left 1970s   BREAST LUMPECTOMY WITH NEEDLE LOCALIZATION Left 07/07/2013   Procedure: BREAST LUMPECTOMY WITH NEEDLE LOCALIZATION;  Surgeon: Merrie Roof, MD;  Location: [facility];  Service: General;  Laterality: Left;   CARDIAC CATHETERIZATION N/A 01/22/2016   Procedure: Left Heart Cath and Coronary Angiography;  Surgeon: Peter M Martinique, MD;  Location: [facility];  Service: Cardiovascular;  Laterality: N/A;   CARDIAC CATHETERIZATION N/A 01/22/2016   Procedure: Coronary Stent Intervention;  Surgeon: Peter M Martinique, MD;  Location: [facility];  Service: Cardiovascular;  Laterality: N/A;   CAROTID STENT Left 04/2003   small left parietal and left hemispheric CVA/notes 07/29/2010   CHOLECYSTECTOMY OPEN     COLONOSCOPY  2011   COLONOSCOPY N/A 05/10/2019   Procedure: COLONOSCOPY;  Surgeon: Rogene Houston, MD;  [facility];  Service: Endoscopy;  Laterality: N/A;  1:45   CORONARY ATHERECTOMY N/A 12/29/2019   Procedure: CORONARY  ATHERECTOMY;  Surgeon: Jettie Booze, MD;  Location: [facility];  Service: Cardiovascular;  Laterality: N/A;  Prox RCA   CORONARY STENT INTERVENTION N/A 12/29/2019   Procedure: CORONARY STENT INTERVENTION;  Surgeon: Jettie Booze, MD;  Location: [facility];  Service: Cardiovascular;  Laterality: N/A;  Prox RCA   ESOPHAGEAL DILATION N/A 04/28/2022   Procedure: ESOPHAGEAL DILATION;  Surgeon: Harvel Quale, MD;  [facility];  Service: Gastroenterology;  Laterality: N/A;   ESOPHAGOGASTRODUODENOSCOPY  08/06/2011   Procedure: ESOPHAGOGASTRODUODENOSCOPY (EGD);  Surgeon: Rogene Houston, MD;  [facility];  Service: Endoscopy;  Laterality: N/A;   ESOPHAGOGASTRODUODENOSCOPY N/A 05/10/2019   Procedure: ESOPHAGOGASTRODUODENOSCOPY (EGD);  Surgeon: Rogene Houston, MD;  [facility];  Service: Endoscopy;  Laterality: N/A;   ESOPHAGOGASTRODUODENOSCOPY (EGD) WITH PROPOFOL N/A 04/28/2022   Procedure: ESOPHAGOGASTRODUODENOSCOPY (EGD) WITH PROPOFOL;  Surgeon: Harvel Quale, MD;  [facility];  Service: Gastroenterology;  Laterality: N/A;  10:30 am, pt can't move up due to transportation   [place]  08/06/2011   Procedure: FLEXIBLE SIGMOIDOSCOPY;  Surgeon: Rogene Houston, MD;  [facility];  Service: Endoscopy;  Laterality: N/A;  INGUINAL HERNIA REPAIR Left 1970s   INTRAVASCULAR ULTRASOUND/IVUS N/A 12/29/2019   Procedure: Intravascular Ultrasound/IVUS;  Surgeon: Jettie Booze, MD;  Location: [facility];  Service: Cardiovascular;  Laterality: N/A;   IR ANGIO INTRA EXTRACRAN SEL COM CAROTID INNOMINATE BILAT MOD SED  11/01/2020   IR ANGIO VERTEBRAL SEL VERTEBRAL BILAT MOD SED  11/01/2020   IR RADIOLOGIST EVAL & MGMT  09/23/2020   IR US GUIDE VASC ACCESS RIGHT  11/01/2020   LARYNX SURGERY  1970s   Polyps excised   LEFT HEART CATH AND CORONARY ANGIOGRAPHY N/A 12/29/2019   Procedure:  LEFT HEART CATH AND CORONARY ANGIOGRAPHY;  Surgeon: Jettie Booze, MD;  Location: [facility];  Service: Cardiovascular;  Laterality: N/A;   MALONEY DILATION  05/10/2019   Procedure: Venia Minks DILATION;  Surgeon: Rogene Houston, MD;  [facility];  Service: Endoscopy;;   MELANOMA EXCISION Left ~ 2016   forearm   POLYPECTOMY  05/10/2019   Procedure: POLYPECTOMY;  Surgeon: Rogene Houston, MD;  [facility];  Service: Endoscopy;;  colon   SPLENECTOMY, 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   spontaneous rupture   TEMPORARY PACEMAKER N/A 12/29/2019   Procedure: TEMPORARY PACEMAKER;  Surgeon: Jettie Booze, MD;  Location: [facility];  Service: Cardiovascular;  Laterality: N/A;    Family History: Family History  Problem Relation Age of Onset   Heart failure Mother    Cancer Father    Heart failure Father    Cancer Sister    Dementia Sister    Heart disease Other    Arthritis Other    Cancer Other    Diabetes Other    Kidney disease Other    Cancer Sister    Heart failure Brother     Social History: Social History   Tobacco Use  Smoking Status Every Day   Packs/day: 0.50   Years: 40.00   Additional pack years: 0.00   Total pack years: 20.00   Types: Cigarettes   Passive exposure: Current  Smokeless Tobacco Never   Social History   Substance and Sexual Activity  Alcohol Use No   Alcohol/week: 0.0 standard drinks of alcohol   Social History   Substance and Sexual Activity  Drug Use No    Allergies: Allergies  Allergen Reactions   Gabapentin Nausea And Vomiting   Prednisone Palpitations   Tape Other (See Comments)    Causes blisters to form   Latex Rash and Other (See Comments)    Blisters    Medications: Current Outpatient Medications  Medication Sig Dispense Refill   albuterol (PROVENTIL HFA;VENTOLIN HFA) 108 (90 Base) MCG/ACT inhaler Inhale 2 puffs into the lungs every 4 (four) hours as needed for wheezing or shortness of breath.      albuterol (PROVENTIL) (2.5 MG/3ML) 0.083% nebulizer solution Take 2.5 mg by nebulization every 6 (six) hours as needed for wheezing or shortness of breath.     ALPRAZolam (XANAX) 1 MG tablet Take 0.5-1 mg by mouth at bedtime as needed for sleep.     amLODipine (NORVASC) 5 MG tablet TAKE 1 TABLET BY MOUTH ONCE DAILY. 90 tablet 2   aspirin EC 81 MG tablet Take 1 tablet (81 mg total) by mouth daily.     aspirin-acetaminophen-caffeine (EXCEDRIN MIGRAINE) 250-250-65 MG tablet Take 1 tablet by mouth daily as needed for headache.     atorvastatin (LIPITOR) 80 MG tablet TAKE ONE TABLET BY MOUTH DAILY AT 6 P.M. 90 tablet 3  azaTHIOprine (IMURAN) 50 MG tablet Take 1 tablet (50 mg total) by mouth daily. 90 tablet 1   cetirizine (ZYRTEC) 10 MG tablet Take 10 mg by mouth daily.     Cholecalciferol (VITAMIN D3 SUPER STRENGTH) 50 MCG (2000 UT) CAPS Take 2,000 Units by mouth daily.     clopidogrel (PLAVIX) 75 MG tablet TAKE (1) TABLET BY MOUTH ONCE DAILY. 90 tablet 1   clorazepate (TRANXENE) 7.5 MG tablet Take 7.5 mg by mouth 3 (three) times daily as needed for anxiety.      docusate sodium (COLACE) 100 MG capsule Take 200 mg by mouth at bedtime.     famotidine (PEPCID) 20 MG tablet Take 1 tablet (20 mg total) by mouth at bedtime. 30 tablet 11   fluticasone (FLONASE) 50 MCG/ACT nasal spray Place 2 sprays into both nostrils as needed for allergies.     furosemide (LASIX) 40 MG tablet Take 40 mg by mouth daily as needed for fluid (leg/hand swelling.).     HYDROcodone-acetaminophen (NORCO) 10-325 MG tablet Take 1 tablet by mouth every 4 (four) hours as needed for moderate pain.     isosorbide mononitrate (IMDUR) 120 MG 24 hr tablet TAKE ONE TABLET BY MOUTH ONCE DAILY. 90 tablet 2   JARDIANCE 10 MG TABS tablet Take 10 mg by mouth every morning.     Ketotifen Fumarate (ALLERGY EYE DROPS OP) Place 1 drop into both eyes daily as needed (allergies).     levothyroxine (SYNTHROID, LEVOTHROID) 50 MCG tablet Take 50  mcg by mouth daily before breakfast.      losartan (COZAAR) 25 MG tablet Take 1 tablet (25 mg total) by mouth daily. 30 tablet 6   nitroGLYCERIN (NITROSTAT) 0.4 MG SL tablet Place 0.4 mg under the tongue every 5 (five) minutes as needed for chest pain.       ondansetron (ZOFRAN) 4 MG tablet Take 1 tablet (4 mg total) by mouth 3 (three) times daily as needed. 30 tablet 1   pantoprazole (PROTONIX) 40 MG tablet TAKE 1 TABLET BY MOUTH ONCE A DAY. 90 tablet 1   Current Facility-Administered Medications  Medication Dose Route Frequency Provider Last Rate Last Admin   sodium chloride flush (NS) 0.9 % injection 3 mL  3 mL Intravenous Q12H Branch, Alphonse Guild, MD        Review of Systems: GENERAL: negative for malaise, night sweats HEENT: No changes in hearing or vision, no nose bleeds or other nasal problems. NECK: Negative for lumps, goiter, pain and significant neck swelling RESPIRATORY: Negative for cough, wheezing CARDIOVASCULAR: Negative for chest pain, leg swelling, palpitations, orthopnea GI: SEE HPI MUSCULOSKELETAL: Negative for joint pain or swelling, back pain, and muscle pain. SKIN: Negative for lesions, rash PSYCH: Negative for sleep disturbance, mood disorder and recent psychosocial stressors. HEMATOLOGY Negative for prolonged bleeding, bruising easily, and swollen nodes. ENDOCRINE: Negative for cold or heat intolerance, polyuria, polydipsia and goiter. NEURO: negative for tremor, gait imbalance, syncope and seizures. The remainder of the review of systems is noncontributory.   Physical Exam: BP 116/74 (BP Location: Left Arm, Patient Position: Sitting, Cuff Size: Large)   Pulse 81   Temp 98.2 F (36.8 C) (Oral)   Ht 5\' 3"  (1.6 m)   Wt 181 lb (82.1 kg)   BMI 32.06 kg/m  GENERAL: The patient is AO x3, in no acute distress. HEENT: Head is normocephalic and atraumatic. EOMI are intact. Mouth is well hydrated and without lesions. NECK: Supple. No masses LUNGS: Clear to  auscultation. No  presence of rhonchi/wheezing/rales. Adequate chest expansion HEART: RRR, normal s1 and s2. ABDOMEN: Soft, nontender, no guarding, no peritoneal signs, and nondistended. BS +. No masses. EXTREMITIES: Without any cyanosis, clubbing, rash, lesions or edema. NEUROLOGIC: AOx3, no focal motor deficit. SKIN: no jaundice, no rashes  Imaging/Labs: as above  I personally reviewed and interpreted the available labs, imaging and endoscopic files.  Impression and Plan: SHADIYA HALLINAN is a 72 y.o. 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   The patient has a longstanding history of autoimmune hepatitis and possible overlapping PBC which has been managed with azathioprine.  This has led to normalization of her LFTs through the years with minimal dose of azathioprine.  She has achieved biochemical remission.  Notably, her alkaline phosphatase has been normal through the years even though she has not been on treatment for subclinical PBC.  Importantly, she had evidence of bridging fibrosis on her diagnostic liver biopsy.  Given this, I counseled the patient about the importance of starting treatment for PBC to avoid further progression of her fibrosis which she is agreeable to proceed with.  I will continue her on the same dose of azathioprine and start her on URSO 500 mg twice daily.  She will need to have repeat labs in her follow-up appointment.  Regarding her episodes of choking, her most recent endoscopy did not show any intraluminal pathologies and she had very transient improvement of her symptoms with dilation.  Part of her symptoms could be related to the  use of opiates but she was found to have some abnormalities in her MBS.  These alterations could be related to her history of stroke.  I advised her to follow the instructions provided by the speech and swallow therapist, she agreed and understood.  Discussed possibility of proceeding with manometry to further evaluate her symptoms but she would like to hold off on this.  Finally, she had a history of more than 4 tubular adenomas in her most recent colonoscopy.  Technically, she is due for repeat colonoscopy.  She would like to hold off on proceeding with this for now and will give me a call once she is ready to proceed with colonoscopy.  - Continue azathioprine 50 mg qday - Start ursodiol 500 mg twice a day - Continue implementing swallowing recommendations - take small sips of water and briefly holding the liquid in her mouth to allow adequate swallowing - Please call back if interested in proceeding with surveillance colonoscopy  All questions were answered.      [name] Peppers, MD Gastroenterology and Hepatology [facility]

## 2022-06-01 NOTE — Patient Instructions (Addendum)
Continue azathioprine 50 mg qday Start ursodiol 500 mg twice a day Continue implementing swallowing recommendations - take small sips of water and briefly holding the liquid in her mouth to allow adequate swallowing Please call back if interested in proceeding with surveillance colonoscopy

## 2022-06-15 ENCOUNTER — Other Ambulatory Visit: Payer: Self-pay | Admitting: Cardiology

## 2022-06-29 ENCOUNTER — Telehealth (INDEPENDENT_AMBULATORY_CARE_PROVIDER_SITE_OTHER): Payer: Self-pay | Admitting: Gastroenterology

## 2022-06-29 NOTE — Telephone Encounter (Signed)
Pt is scheduled for an appointment, 07/13/22, clearance will be addressed at that time.    Will route to the requesting surgeon's office to make them aware. 

## 2022-06-29 NOTE — Telephone Encounter (Signed)
Medication clearance sent and Victorino Dike contacted to let her know that I will be in touch once clearance received.

## 2022-06-29 NOTE — Telephone Encounter (Signed)
Room 3, needs clearance to hold Plavix for 5 days Thanks

## 2022-06-29 NOTE — Telephone Encounter (Signed)
    06/29/22  Antony Odea 1950/04/21  What type of surgery is being performed? Colonoscopy  When is surgery scheduled? TBD  Clearance to hold Plavix x 5 days  Name of physician performing surgery?  Dr. Katrinka Blazing [facility] Phone: [phone] Fax: [phone]  Anethesia type (none, local, MAC, general)? MAC

## 2022-06-29 NOTE — Telephone Encounter (Signed)
Pt is scheduled for an appointment, 07/13/22, clearance will be addressed at that time.    Will route to the requesting surgeon's office to make them aware.

## 2022-06-29 NOTE — Telephone Encounter (Signed)
[name] left voicemail wanting to schedule colonoscopy for pt. Pt was seen in office in March. Please advise what room. Thank you.

## 2022-06-29 NOTE — Telephone Encounter (Signed)
   Name: [name]  DOB: 04-04-1950  MRN: [id number]  Primary Cardiologist: Dina Rich, MD  Chart reviewed as part of pre-operative protocol coverage. Because of [name]'s past medical history and time since last visit, she will require a follow-up in-office visit in order to better assess preoperative cardiovascular risk.  Pre-op covering staff: - Please schedule appointment and call patient to inform them. If patient already had an upcoming appointment within acceptable timeframe, please add "pre-op clearance" to the appointment notes so provider is aware. - Please contact requesting surgeon's office via preferred method (i.e, phone, fax) to inform them of need for appointment prior to surgery.  If patient is asymptomatic at the time of office visit, can hold Plavix x 5 days prior to the colonoscopy.  Please restart when medically safe to do so.  Sharlene Dory, PA-C  06/29/2022, 12:40 PM

## 2022-07-09 ENCOUNTER — Encounter: Payer: Self-pay | Admitting: Physician Assistant

## 2022-07-09 NOTE — Progress Notes (Addendum)
Cardiology Office Note    Date:  07/13/2022   ID:  [name], DOB 07-30-1950, MRN [id number]  PCP:  Gareth Morgan, MD  Cardiologist:  Dina Rich, MD  Electrophysiologist:  None   Chief Complaint: pre-procedure evaluation  History of Present Illness:   [name] is a 72 y.o. 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 Afib remotely added to PMH by non cardiac provider in setting of Coumadin which was actually used for her stroke and carotid dissection.  Prior history reviewed above. Remotely she had a left hemispheric stroke in 04/2003 felt due to LICA dissection treated with Coumadin initially with plan to follow imaging and later change to antiplatelet therapy. She also had carotid stenting by Dr. Corliss Skains. She had a presumed right brain CVA vs TIA in 05/2008; imaging reported to be negative. She's been followed with periodic brain imaging. She was admitted 09/2015 for symptomatic bradycardia with pauses up to 5 seconds and junctional rhythm. Her clonidine and diltiazem were stopped with improvement. She was admitted 01/2016 with NSTEMI/PCI as above, in 08/2016 with bradycardia requiring discontinuation of carvedilol. She had repeat cath 12/2019 for worsening chest pain with patent OM stent but 90% RCA treated with atherectomy/DES, EF normal. It was recommended to consider clopidogrel monotherapy long-term given some diffuse disease in her LAD system. RCA was prone to vasospasm during procedure. Last ischemic eval was by nuc 08/2020 which showed mild thinning of  anterior wall (mid/distal) with decreased tracer activity consistent with mild anterior ischemia; cannot exclude however that changes are due to shifting breast attenuation, EF 80%, felt to be low risk study. At last OV 11/2021 she was having some intermittent arm pain improved with SL NTG in setting of emotional stress with passing of her son and her dog. Symptoms were felt due to stress, perhaps recurrence of vasospasm, monitored conservatively. She had an ED visit 05/2022 for pain in her left arm and below the elbow area, no chest pain, negative troponins despite persistent discomfort and normal EKG arguing against cardiology etiology. This resolved with conservative management.  She is seen for follow-up today overall doing well. She reports in the last 6 months she's only had to take SL NTG once. She remains physically active in the yard, housework, and going up and down stairs all day long without any recent exertional angina or dyspnea. She states these things actually make her feel better. No syncope or palpitations. She is tearful recalling that the one year anniversary of her son's passing is coming up. She is here with her case worker. Appointment was made due to need for upcoming colonoscopy risk stratification and clearance to hold clopidogrel.    Labwork independently reviewed: 05/2022 Trops neg, CBC wnl, K 3.4, Cr 0.63 02/2022 LFTs ok 2022 LDL 69, trig 115  Past History   Past Medical History:  Diagnosis Date   Anxiety    Arthritis    "hands, arms, back, neck, knees, fingers" (01/21/2016)   Bradycardia    Breast cancer, left breast (HCC) 1970s; 2015   Cancer of skin of leg    BLE   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HCC)  left   Chronic lower back pain    Chronic pain    COPD (chronic obstructive pulmonary disease) (HCC)    Coronary artery disease    Depression    GERD (gastroesophageal reflux disease)    Heart murmur    Hepatic cirrhosis (HCC) 12/20/2017   Hepatitis    History  of blood transfusion    "related to spleen"   History of hiatal hernia    History of kidney stones    Hyperlipidemia    Hypertension    Hypothyroidism    Kidney stone    Melanoma of forearm, left (HCC)    Migraine    "a few migraines/year" (01/21/2016)   Obesity    Stroke (HCC) 04/2003   2 carotid artery stents in place; small left parietal and left hemispheric CVA.[name] 01/21/2016   Tobacco use disorder    50 pack years; questionably discontinued in 2010   Wears glasses     Past Surgical History:  Procedure Laterality Date   ABDOMINAL HYSTERECTOMY  1978   APPENDECTOMY     BIOPSY  04/28/2022   Procedure: BIOPSY;  Surgeon: Marguerita Merles, Reuel Boom, MD;  [facility];  Service: Gastroenterology;;   BREAST BIOPSY Left 1970s; 2015   BREAST LUMPECTOMY Left 1970s   BREAST LUMPECTOMY WITH NEEDLE LOCALIZATION Left 07/07/2013   Procedure: BREAST LUMPECTOMY WITH NEEDLE LOCALIZATION;  Surgeon: Robyne Askew, MD;  Location: [facility];  Service: General;  Laterality: Left;   CARDIAC CATHETERIZATION N/A 01/22/2016   Procedure: Left Heart Cath and Coronary Angiography;  Surgeon: Peter M Swaziland, MD;  Location: [facility] INVASIVE CV LAB;  Service: Cardiovascular;  Laterality: N/A;   CARDIAC CATHETERIZATION N/A 01/22/2016   Procedure: Coronary Stent Intervention;  Surgeon: Peter M Swaziland, MD;  Location: [facility] INVASIVE CV LAB;  Service: Cardiovascular;  Laterality: N/A;   CAROTID STENT Left 04/2003   small left parietal and left hemispheric CVA/notes 07/29/2010   CHOLECYSTECTOMY OPEN     COLONOSCOPY  2011   COLONOSCOPY N/A 05/10/2019   Procedure: COLONOSCOPY;  Surgeon: Malissa Hippo, MD;  [facility]: [facility];  Service: Endoscopy;  Laterality: N/A;  1:45   CORONARY ATHERECTOMY N/A 12/29/2019   Procedure: CORONARY ATHERECTOMY;  Surgeon: Corky Crafts, MD;  Location: [facility] INVAS[facility] CV LAB;  Service: Cardiovascular;  Laterality: N/A;  Prox RCA   CORONARY STENT INTERVENTION  N/A 12/29/2019   Procedure: CORONARY STENT INTERVENTION;  Surgeon: Corky Crafts, MD;  Location: [facility] INVASIVE CV LAB;  Service: Cardiovascular;  Laterality: N/A;  Prox RCA   CORONARY ULTRASOUND/IVUS N/A 12/29/2019   Procedure: Intravascular Ultrasound/IVUS;  Surgeon: Corky Crafts, MD;  Location: [facility] INVASIVE CV LAB;  Service: Cardiovascular;  Laterality: N/A;   ESOPHAGEAL DILATION N/A 04/28/2022   Procedure: ESOPHAGEAL DILATION;  Surgeon: Dolores Frame, MD;  [facility];  Service: Gastroenterology;  Laterality: N/A;   ESOPHAGOGASTRODUODENOSCOPY  08/06/2011   Procedure: ESOPHAGOGASTRODUODENOSCOPY (EGD);  Surgeon: Malissa Hippo, MD;  [facility];  Service: Endoscopy;  Laterality: N/A;   ESOPHAGOGASTRODUODENOSCOPY N/A 05/10/2019   Procedure: ESOPHAGOGASTRODUODENOSCOPY (EGD);  Surgeon: Malissa Hippo, MD;  [facility];  Service: Endoscopy;  Laterality: N/A;   ESOPHAGOGASTRODUODENOSCOPY (EGD) WITH PROPOFOL N/A 04/28/2022   Procedure: ESOPHAGOGASTRODUODENOSCOPY (EGD) WITH PROPOFOL;  Surgeon: Dolores Frame, MD;  [facility];  Service: Gastroenterology;  Laterality: N/A;  10:30 am, pt can't move up due to transportation   FLEXIBLE SIGMOIDOSCOPY  08/06/2011  Procedure: FLEXIBLE SIGMOIDOSCOPY;  Surgeon: Malissa Hippo, MD;  [facility];  Service: Endoscopy;  Laterality: N/A;   INGUINAL HERNIA REPAIR Left 1970s   IR ANGIO INTRA EXTRACRAN SEL COM CAROTID INNOMINATE BILAT MOD SED  11/01/2020   IR ANGIO VERTEBRAL SEL VERTEBRAL BILAT MOD SED  11/01/2020   IR RADIOLOGIST EVAL & MGMT  09/23/2020   IR US GUIDE VASC ACCESS RIGHT  11/01/2020   LARYNX SURGERY  1970s   Polyps excised   LEFT HEART CATH AND CORONARY ANGIOGRAPHY N/A 12/29/2019   Procedure: LEFT HEART CATH AND CORONARY ANGIOGRAPHY;  Surgeon: Corky Crafts, MD;  Location: [facility] INVASIVE CV LAB;  Service: Cardiovascular;  Laterality: N/A;   MALONEY DILATION   05/10/2019   Procedure: [name] DILATION;  Surgeon: Malissa Hippo, MD;  [facility]: [facility];  Service: Endoscopy;;   MELANOMA EXCISION Left ~ 2016   forearm   POLYPECTOMY  05/10/2019   Procedure: POLYPECTOMY;  Surgeon: Malissa Hippo, MD;  [facility]: [facility];  Service: Endoscopy;;  colon   SPLENECTOMY, 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   spontaneous rupture   TEMPORARY PACEMAKER N/A 12/29/2019   Procedure: TEMPORARY PACEMAKER;  Surgeon: Corky Crafts, MD;  Location: [facility] INVASIVE CV LAB;  Service: Cardiovascular;  Laterality: N/A;    Current Medications: Current Meds  Medication Sig   albuterol (PROVENTIL HFA;VENTOLIN HFA) 108 (90 Base) MCG/ACT inhaler Inhale 2 puffs into the lungs every 4 (four) hours as needed for wheezing or shortness of breath.   albuterol (PROVENTIL) (2.5 MG/3ML) 0.083% nebulizer solution Take 2.5 mg by nebulization every 6 (six) hours as needed for wheezing or shortness of breath.   ALPRAZolam (XANAX) 1 MG tablet Take 0.5-1 mg by mouth at bedtime as needed for sleep.   amLODipine (NORVASC) 5 MG tablet TAKE 1 TABLET BY MOUTH ONCE DAILY.   aspirin EC 81 MG tablet Take 1 tablet (81 mg total) by mouth daily.   aspirin-acetaminophen-caffeine (EXCEDRIN MIGRAINE) 250-250-65 MG tablet Take 1 tablet by mouth daily as needed for headache.   atorvastatin (LIPITOR) 80 MG tablet TAKE ONE TABLET BY MOUTH DAILY AT 6 P.M.   azaTHIOprine (IMURAN) 50 MG tablet Take 1 tablet (50 mg total) by mouth daily.   cetirizine (ZYRTEC) 10 MG tablet Take 10 mg by mouth daily.   Cholecalciferol (VITAMIN D3 SUPER STRENGTH) 50 MCG (2000 UT) CAPS Take 2,000 Units by mouth daily.   clopidogrel (PLAVIX) 75 MG tablet TAKE (1) TABLET BY MOUTH ONCE DAILY.   clorazepate (TRANXENE) 7.5 MG tablet Take 7.5 mg by mouth 3 (three) times daily as needed for anxiety.    docusate sodium (COLACE) 100 MG capsule Take 200 mg by mouth at bedtime.   famotidine (PEPCID) 20 MG tablet Take 1 tablet (20 mg total) by mouth at  bedtime.   furosemide (LASIX) 40 MG tablet Take 40 mg by mouth daily as needed for fluid (leg/hand swelling.).   HYDROcodone-acetaminophen (NORCO) 10-325 MG tablet Take 1 tablet by mouth every 4 (four) hours as needed for moderate pain.   isosorbide mononitrate (IMDUR) 120 MG 24 hr tablet TAKE ONE TABLET BY MOUTH ONCE DAILY.   JARDIANCE 10 MG TABS tablet Take 10 mg by mouth every morning.   Ketotifen Fumarate (ALLERGY EYE DROPS OP) Place 1 drop into both eyes daily as needed (allergies).   levothyroxine (SYNTHROID, LEVOTHROID) 50 MCG tablet Take 50 mcg by mouth daily before breakfast.    losartan (COZAAR) 25 MG tablet Take 1 tablet (25 mg  total) by mouth daily.   nitroGLYCERIN (NITROSTAT) 0.4 MG SL tablet Place 0.4 mg under the tongue every 5 (five) minutes as needed for chest pain.     ondansetron (ZOFRAN) 4 MG tablet Take 1 tablet (4 mg total) by mouth 3 (three) times daily as needed.   pantoprazole (PROTONIX) 40 MG tablet TAKE 1 TABLET BY MOUTH ONCE A DAY.   ursodiol (ACTIGALL) 500 MG tablet Take 1 tablet (500 mg total) by mouth 2 (two) times daily.     Allergies:   Gabapentin, Prednisone, Carvedilol, Clonidine derivatives, Diltiazem, Tape, and Latex   Social History   Socioeconomic History   Marital status: Divorced    Spouse name: Not on file   Number of children: 2   Years of education: Not on file   Highest education level: Not on file  Occupational History   Occupation: Disabled   Occupation: retired  Tobacco Use   Smoking status: Every Day    Packs/day: 0.50    Years: 40.00    Additional pack years: 0.00    Total pack years: 20.00    Types: Cigarettes    Passive exposure: Current   Smokeless tobacco: Never  Vaping Use   Vaping Use: Never used  Substance and Sexual Activity   Alcohol use: No    Alcohol/week: 0.0 standard drinks of alcohol   Drug use: No   Sexual activity: Not Currently  Other Topics Concern   Not on file  Social History Narrative   ** Merged  History Encounter **       Social Determinants of Health   Financial Resource Strain: Not on file  Food Insecurity: Not on file  Transportation Needs: Not on file  Physical Activity: Not on file  Stress: Not on file  Social Connections: Not on file     Family History:  The patient's family history includes Arthritis in an other family member; Cancer in her father, sister, sister, and another family member; Dementia in her sister; Diabetes in an other family member; Heart disease in an other family member; Heart failure in her brother, father, and mother; Kidney disease in an other family member.  ROS:   Please see the history of present illness.  All other systems are reviewed and otherwise negative.    EKG(s)/Additional Testing   EKG:  EKG is ordered today, personally reviewed, demonstrating NSR 82bpm no acute STT changes  CV Studies: Cardiac studies reviewed are outlined and summarized above. Otherwise please see EMR for full report.  Recent Labs: 02/25/2022: ALT 16 05/26/2022: BUN 12; Creatinine, Ser 0.63; Hemoglobin 13.8; Platelets 317; Potassium 3.4; Sodium 137  Recent Lipid Panel    Component Value Date/Time   CHOL 128 04/08/2020 1045   TRIG 115 04/08/2020 1045   HDL 38 (L) 04/08/2020 1045   CHOLHDL 3.4 04/08/2020 1045   CHOLHDL 3.4 03/27/2016 1134   VLDL 20 03/27/2016 1134   LDLCALC 69 04/08/2020 1045    PHYSICAL EXAM:    VS:  BP 132/78   Pulse 86   Ht 5\' 3"  (1.6 m)   Wt 181 lb 9.6 oz (82.4 kg)   SpO2 93%   BMI 32.17 kg/m   BMI: Body mass index is 32.17 kg/m.  GEN: Well nourished, well developed female in no acute distress HEENT: normocephalic, atraumatic Neck: no JVD, carotid bruits, or masses Cardiac: RRR; no murmurs, rubs, or gallops, no edema  Respiratory:  clear to auscultation bilaterally, normal work of breathing GI: soft, nontender, nondistended, + BS  MS: no deformity or atrophy Skin: warm and dry, no rash Neuro:  Alert and Oriented x 3,  Strength and sensation are intact, follows commands Psych: euthymic mood, full affect but appropriately tearful when discussing son's passing  Wt Readings from Last 3 Encounters:  07/13/22 181 lb 9.6 oz (82.4 kg)  06/01/22 181 lb (82.1 kg)  05/26/22 170 lb (77.1 kg)     ASSESSMENT & PLAN:   1. Preoperative cardiovascular examination - largely appears to be clinically stable from prior without accelerating symptoms. Based on ACC/AHA guidelines, TALAYSHA FREEBERG would be at acceptable risk for the planned procedure without further cardiovascular testing. Per discussion with Dr. Wyline Mood, OK to hold Plavix for 5 days prior to colonoscopy from cardiac standpoint but we would suggest GI team reach out to Dr. Corliss Skains to confirm their clearance as well given h/o cerebral stenting. Will route copy of this note to GI team by fax and EMR so they are aware.  2. CAD, HLD - symptoms largely stable. Only one episode of CP in last 6 months, tends to be exacerbated by emotional stress, none recently.. ED workup 05/2022 for elbow pain reassuring. EKG is normal. She feels better when she exerts herself. Will get baseline TSH given h/o CP. She has been maintained on chronic DAPT with ASA + Plavix which we will continue. Continue statin. Will request copy of labs from PCP - she reports they follow lipids. LFTs were normal in 02/2022. Continue amlodipine and isosorbide. We also discussed tobacco cessation.   3. Chronic diastolic CHF, HTN, recent hypokalemia - volume status looks stable. She recalls being put on a potassium supplement sometime in the past but does not know if she's taking it. She will check when she gets home. We'll check BMET, Mg today with labs. SBP now slightly above goal. The patient was instructed to monitor their blood pressure at home and to call if tending to run higher than 130 on the top number. No change made in HTN regimen today.  4. H/o CVA due to carotid artery dissection - continue f/u  with PCP and Dr. Corliss Skains.     Disposition: F/u with Dr. Wyline Mood as scheduled in 11/2022.   Medication Adjustments/Labs and Tests Ordered: Current medicines are reviewed at length with the patient today.  Concerns regarding medicines are outlined above. Medication changes, Labs and Tests ordered today are summarized above and listed in the Patient Instructions accessible in Encounters.    Signed, Laurann Montana, PA-C  07/13/2022 1:19 PM    [facility] -  Location in [facility] [address] [place], [place] [address] Ph: [phone]; Fax [phone]

## 2022-07-13 ENCOUNTER — Encounter: Payer: Self-pay | Admitting: *Deleted

## 2022-07-13 ENCOUNTER — Other Ambulatory Visit (INDEPENDENT_AMBULATORY_CARE_PROVIDER_SITE_OTHER): Payer: Self-pay | Admitting: Gastroenterology

## 2022-07-13 ENCOUNTER — Ambulatory Visit: Payer: 59 | Attending: Physician Assistant | Admitting: Physician Assistant

## 2022-07-13 ENCOUNTER — Encounter: Payer: Self-pay | Admitting: Physician Assistant

## 2022-07-13 ENCOUNTER — Other Ambulatory Visit: Payer: Self-pay | Admitting: Cardiology

## 2022-07-13 ENCOUNTER — Other Ambulatory Visit (HOSPITAL_COMMUNITY)
Admission: RE | Admit: 2022-07-13 | Discharge: 2022-07-13 | Disposition: A | Discharge status: Home / Self Care | Payer: 59 | Source: Ambulatory Visit | Attending: Physician Assistant | Admitting: Physician Assistant

## 2022-07-13 VITALS — BP 132/78 | HR 86 | Ht 63.0 in | Wt 181.6 lb

## 2022-07-13 DIAGNOSIS — I5032 Chronic diastolic (congestive) heart failure: Secondary | ICD-10-CM

## 2022-07-13 DIAGNOSIS — R1319 Other dysphagia: Secondary | ICD-10-CM

## 2022-07-13 DIAGNOSIS — I10 Essential (primary) hypertension: Secondary | ICD-10-CM

## 2022-07-13 DIAGNOSIS — E785 Hyperlipidemia, unspecified: Secondary | ICD-10-CM | POA: Diagnosis present

## 2022-07-13 DIAGNOSIS — K219 Gastro-esophageal reflux disease without esophagitis: Secondary | ICD-10-CM

## 2022-07-13 DIAGNOSIS — E876 Hypokalemia: Secondary | ICD-10-CM | POA: Diagnosis present

## 2022-07-13 DIAGNOSIS — Z01810 Encounter for preprocedural cardiovascular examination: Secondary | ICD-10-CM

## 2022-07-13 DIAGNOSIS — Z8673 Personal history of transient ischemic attack (TIA), and cerebral infarction without residual deficits: Secondary | ICD-10-CM

## 2022-07-13 DIAGNOSIS — I2510 Atherosclerotic heart disease of native coronary artery without angina pectoris: Secondary | ICD-10-CM

## 2022-07-13 LAB — BASIC METABOLIC PANEL
Anion gap: 7 (ref 5–15)
BUN: 15 mg/dL (ref 8–23)
CO2: 27 mmol/L (ref 22–32)
Calcium: 9.4 mg/dL (ref 8.9–10.3)
Chloride: 104 mmol/L (ref 98–111)
Creatinine, Ser: 0.73 mg/dL (ref 0.44–1.00)
GFR, Estimated: >60 mL/min (ref >60)
Glucose, Bld: 115 mg/dL — ABNORMAL HIGH (ref 70–99)
Potassium: 3.7 mmol/L (ref 3.5–5.1)
Sodium: 138 mmol/L (ref 135–145)

## 2022-07-13 LAB — TSH: TSH: 2.427 u[IU]/mL (ref 0.350–4.500)

## 2022-07-13 LAB — MAGNESIUM: Magnesium: 1.9 mg/dL (ref 1.7–2.4)

## 2022-07-13 NOTE — Patient Instructions (Signed)
Medication Instructions:  Your physician recommends that you continue on your current medications as directed. Please refer to the Current Medication list given to you today.  Check if taking Potassium at home and let us know   *If you need a refill on your cardiac medications before your next appointment, please call your pharmacy*   Lab Work: Your physician recommends that you return for lab work in: Today   If you have labs (blood work) drawn today and your tests are completely normal, you will receive your results only by: MyChart Message (if you have MyChart) OR A paper copy in the mail If you have any lab test that is abnormal or we need to change your treatment, we will call you to review the results.   Testing/Procedures: NONE    Follow-Up: At [facility], you and your health needs are our priority.  As part of our continuing mission to provide you with exceptional heart care, we have created designated Provider Care Teams.  These Care Teams include your primary Cardiologist (physician) and Advanced Practice Providers (APPs -  Physician Assistants and Nurse Practitioners) who all work together to provide you with the care you need, when you need it.  We recommend signing up for the patient portal called "MyChart".  Sign up information is provided on this After Visit Summary.  MyChart is used to connect with patients for Virtual Visits (Telemedicine).  Patients are able to view lab/test results, encounter notes, upcoming appointments, etc.  Non-urgent messages can be sent to your provider as well.   To learn more about what you can do with MyChart, go to [email or web address].    Your next appointment:    September  Provider:   Dina Rich, MD    Other Instructions Thank you for choosing [facility]!

## 2022-07-13 NOTE — Telephone Encounter (Signed)
Pt had cardiology appt today and per office note 1. Preoperative cardiovascular examination - largely appears to be clinically stable from prior without accelerating symptoms. Based on ACC/AHA guidelines, [name] would be at acceptable risk for the planned procedure without further cardiovascular testing. Per discussion with Dr. Wyline Mood, OK to hold Plavix for 5 days prior to colonoscopy from cardiac standpoint but we would suggest GI team reach out to Dr. Corliss Skains to confirm their clearance as well given h/o cerebral stenting. Will route copy of this note to GI team by fax and EMR so they are aware.

## 2022-07-13 NOTE — Telephone Encounter (Signed)
Ok, good to schedule , please ask her to hold Plavix for 5 days Thanks

## 2022-07-14 ENCOUNTER — Telehealth: Payer: Self-pay | Admitting: *Deleted

## 2022-07-14 ENCOUNTER — Encounter: Payer: Self-pay | Admitting: Family Medicine

## 2022-07-14 MED ORDER — PEG 3350-KCL-NA BICARB-NACL 420 G PO SOLR: 4000.0000 mL | Freq: Once | ORAL | 0 refills | Status: AC

## 2022-07-14 MED ORDER — POTASSIUM CHLORIDE CRYS ER 20 MEQ PO TBCR: 20.0000 meq | EXTENDED_RELEASE_TABLET | Freq: Every day | ORAL | 11 refills | Status: DC | PRN

## 2022-07-14 NOTE — Telephone Encounter (Signed)
-----   Message from Laurann Montana, [place] sent at 07/13/2022  3:01 PM EDT ----- Please let pt know labs stable except glucose mildly elevated. Potassium, magnesium improved from prior. Would have her check at home if she is taking a potassium supplement daily like she mentioned in the visit.   If so, please update med reconciliation to reflect that she is taking this and let me know dose.  If not, I would advise to start KCl daily PRN only on days that she takes Lasix.  Thank you!

## 2022-07-14 NOTE — Addendum Note (Signed)
Addended by: Marlowe Shores on: 07/14/2022 10:00 AM   Modules accepted: Orders

## 2022-07-14 NOTE — Telephone Encounter (Signed)
Pt notified and orders placed  

## 2022-07-14 NOTE — Telephone Encounter (Signed)
[name], pt Child psychotherapist, returned call. Pt is scheduled for TCS for 09/01/22 at 1:45 pm. Prep sent to pharmacy. Pt is to hold Plavix x 5 days and Jardiance x 3 days. Instructions will be sent to case worker. She will make a copy of them and take a copy to patient and go over instructions with pt.  Will call [name] with pre op appt  Per [facility] Notification or Prior Authorization is not required for the requested services You are not required to submit a notification/prior authorization based on the information provided. If you have general questions about the prior authorization requirements, visit [email or web address] > Clinician Resources > Advance and Admission Notification Requirements. The number above acknowledges your notification. Please write this reference number down for future reference. If you would like to request an organization determination, please call us at [phone]. Decision ID #: [id number]

## 2022-07-14 NOTE — Telephone Encounter (Signed)
Left message with social worker, Victorino Dike, to return call

## 2022-07-15 ENCOUNTER — Other Ambulatory Visit (HOSPITAL_COMMUNITY): Payer: Self-pay | Admitting: Nurse Practitioner

## 2022-07-15 DIAGNOSIS — R319 Hematuria, unspecified: Secondary | ICD-10-CM

## 2022-07-28 NOTE — Telephone Encounter (Signed)
Left detailed message on social worker Victorino Dike voicemail in regards to pre op date (08/27/22 @ 12:45pm)

## 2022-08-04 ENCOUNTER — Ambulatory Visit: Payer: 59

## 2022-08-04 ENCOUNTER — Ambulatory Visit (HOSPITAL_COMMUNITY)
Admission: RE | Admit: 2022-08-04 | Discharge: 2022-08-04 | Disposition: A | Discharge status: Home / Self Care | Payer: 59 | Source: Ambulatory Visit | Attending: Nurse Practitioner | Admitting: Nurse Practitioner

## 2022-08-04 DIAGNOSIS — R319 Hematuria, unspecified: Secondary | ICD-10-CM

## 2022-08-26 NOTE — Patient Instructions (Signed)
[name]  08/26/2022     @PREFPERIOPPHARMACY @   Your procedure is scheduled on  09/01/2022.   Report to [name] at  1145  A.M.   Call this number if you have problems the morning of surgery:  [phone]  If you experience any cold or flu symptoms such as cough, fever, chills, shortness of breath, etc. between now and your scheduled surgery, please notify us at the above number.   Remember:  Follow the diet and prep instructions given to you by the office.  Your last dose of plavix should be on 08/26/2022 and your last dose of Jardiance should be on 08/28/2022.     Use your nebulizer and your inhaler before you come and bring your rescue inhaler with you.    DO NOT take any medication for diabetes the morning of your procedure.     Take these medicines the morning of surgery with A SIP OF WATER             xanax(if needed), amlodipine,imuran, zyrtec, clorazepate, hydrocodone(if needed), isosorbide, levothyroxine, zofran (if needed), pantoprazole, ursodiol.     Do not wear jewelry, make-up or nail polish, including gel polish,  artificial nails, or any other type of covering on natural nails (fingers and  toes).  Do not wear lotions, powders, or perfumes, or deodorant.  Do not shave 48 hours prior to surgery.  Men may shave face and neck.  Do not bring valuables to the hospital.  [facility] is not responsible for any belongings or valuables.  Contacts, dentures or bridgework may not be worn into surgery.  Leave your suitcase in the car.  After surgery it may be brought to your room.  For patients admitted to the hospital, discharge time will be determined by your treatment team.  Patients discharged the day of surgery will not be allowed to drive home and must have someone with them for 24 hours.    Special instructions:   DO NOT smoke tobacco or vape foe 24 hors before your procedure.  Please read over the following fact sheets that you were  given. Anesthesia Post-op Instructions and Care and Recovery After Surgery      Colonoscopy, Adult, Care After The following information offers guidance on how to care for yourself after your procedure. Your health care provider may also give you more specific instructions. If you have problems or questions, contact your health care provider. What can I expect after the procedure? After the procedure, it is common to have: A small amount of blood in your stool for 24 hours after the procedure. Some gas. Mild cramping or bloating of your abdomen. Follow these instructions at home: Eating and drinking  Drink enough fluid to keep your urine pale yellow. Follow instructions from your health care provider about eating or drinking restrictions. Resume your normal diet as told by your health care provider. Avoid heavy or fried foods that are hard to digest. Activity Rest as told by your health care provider. Avoid sitting for a long time without moving. Get up to take short walks every 1-2 hours. This is important to improve blood flow and breathing. Ask for help if you feel weak or unsteady. Return to your normal activities as told by your health care provider. Ask your health care provider what activities are safe for you. Managing cramping and bloating  Try walking around when you have cramps or feel bloated. If directed, apply heat to  your abdomen as told by your health care provider. Use the heat source that your health care provider recommends, such as a moist heat pack or a heating pad. Place a towel between your skin and the heat source. Leave the heat on for 20-30 minutes. Remove the heat if your skin turns bright red. This is especially important if you are unable to feel pain, heat, or cold. You have a greater risk of getting burned. General instructions If you were given a sedative during the procedure, it can affect you for several hours. Do not drive or operate machinery until  your health care provider says that it is safe. For the first 24 hours after the procedure: Do not sign important documents. Do not drink alcohol. Do your regular daily activities at a slower pace than normal. Eat soft foods that are easy to digest. Take over-the-counter and prescription medicines only as told by your health care provider. Keep all follow-up visits. This is important. Contact a health care provider if: You have blood in your stool 2-3 days after the procedure. Get help right away if: You have more than a small spotting of blood in your stool. You have large blood clots in your stool. You have swelling of your abdomen. You have nausea or vomiting. You have a fever. You have increasing pain in your abdomen that is not relieved with medicine. These symptoms may be an emergency. Get help right away. Call 911. Do not wait to see if the symptoms will go away. Do not drive yourself to the hospital. Summary After the procedure, it is common to have a small amount of blood in your stool. You may also have mild cramping and bloating of your abdomen. If you were given a sedative during the procedure, it can affect you for several hours. Do not drive or operate machinery until your health care provider says that it is safe. Get help right away if you have a lot of blood in your stool, nausea or vomiting, a fever, or increased pain in your abdomen. This information is not intended to replace advice given to you by your health care provider. Make sure you discuss any questions you have with your health care provider. Document Revised: 04/14/2022 Document Reviewed: 10/23/2020 Elsevier Patient Education  2024 [employer] Monitored Anesthesia Care, Care After The following information offers guidance on how to care for yourself after your procedure. Your health care provider may also give you more specific instructions. If you have problems or questions, contact your health care  provider. What can I expect after the procedure? After the procedure, it is common to have: Tiredness. Little or no memory about what happened during or after the procedure. Impaired judgment when it comes to making decisions. Nausea or vomiting. Some trouble with balance. Follow these instructions at home: For the time period you were told by your health care provider:  Rest. Do not participate in activities where you could fall or become injured. Do not drive or use machinery. Do not drink alcohol. Do not take sleeping pills or medicines that cause drowsiness. Do not make important decisions or sign legal documents. Do not take care of children on your own. Medicines Take over-the-counter and prescription medicines only as told by your health care provider. If you were prescribed antibiotics, take them as told by your health care provider. Do not stop using the antibiotic even if you start to feel better. Eating and drinking Follow instructions from your health care provider  about what you may eat and drink. Drink enough fluid to keep your urine pale yellow. If you vomit: Drink clear fluids slowly and in small amounts as you are able. Clear fluids include water, ice chips, low-calorie sports drinks, and fruit juice that has water added to it (diluted fruit juice). Eat light and bland foods in small amounts as you are able. These foods include bananas, applesauce, rice, lean meats, toast, and crackers. General instructions  Have a responsible adult stay with you for the time you are told. It is important to have someone help care for you until you are awake and alert. If you have sleep apnea, surgery and some medicines can increase your risk for breathing problems. Follow instructions from your health care provider about wearing your sleep device: When you are sleeping. This includes during daytime naps. While taking prescription pain medicines, sleeping medicines, or medicines that  make you drowsy. Do not use any products that contain nicotine or tobacco. These products include cigarettes, chewing tobacco, and vaping devices, such as e-cigarettes. If you need help quitting, ask your health care provider. Contact a health care provider if: You feel nauseous or vomit every time you eat or drink. You feel light-headed. You are still sleepy or having trouble with balance after 24 hours. You get a rash. You have a fever. You have redness or swelling around the IV site. Get help right away if: You have trouble breathing. You have new confusion after you get home. These symptoms may be an emergency. Get help right away. Call 911. Do not wait to see if the symptoms will go away. Do not drive yourself to the hospital. This information is not intended to replace advice given to you by your health care provider. Make sure you discuss any questions you have with your health care provider. Document Revised: 07/28/2021 Document Reviewed: 07/28/2021 Elsevier Patient Education  2024 [employer].

## 2022-08-27 ENCOUNTER — Encounter (HOSPITAL_COMMUNITY)
Admission: RE | Admit: 2022-08-27 | Discharge: 2022-08-27 | Disposition: A | Discharge status: Home / Self Care | Payer: 59 | Source: Ambulatory Visit | Attending: Gastroenterology | Admitting: Gastroenterology

## 2022-08-27 ENCOUNTER — Encounter (HOSPITAL_COMMUNITY): Payer: Self-pay

## 2022-08-27 VITALS — BP 134/74 | HR 75 | Temp 97.6°F | Resp 18 | Ht 63.0 in | Wt 181.7 lb

## 2022-08-27 DIAGNOSIS — Z01812 Encounter for preprocedural laboratory examination: Secondary | ICD-10-CM | POA: Insufficient documentation

## 2022-08-27 DIAGNOSIS — M75102 Unspecified rotator cuff tear or rupture of left shoulder, not specified as traumatic: Secondary | ICD-10-CM

## 2022-08-27 DIAGNOSIS — K754 Autoimmune hepatitis: Secondary | ICD-10-CM | POA: Diagnosis not present

## 2022-08-27 LAB — COMPREHENSIVE METABOLIC PANEL
ALT: 13 U/L (ref 0–44)
AST: 19 U/L (ref 15–41)
Albumin: 3.8 g/dL (ref 3.5–5.0)
Alkaline Phosphatase: 65 U/L (ref 38–126)
Anion gap: 9 (ref 5–15)
BUN: 9 mg/dL (ref 8–23)
CO2: 27 mmol/L (ref 22–32)
Calcium: 8.8 mg/dL — ABNORMAL LOW (ref 8.9–10.3)
Chloride: 99 mmol/L (ref 98–111)
Creatinine, Ser: 0.62 mg/dL (ref 0.44–1.00)
GFR, Estimated: >60 mL/min (ref >60)
Glucose, Bld: 146 mg/dL — ABNORMAL HIGH (ref 70–99)
Potassium: 3.2 mmol/L — ABNORMAL LOW (ref 3.5–5.1)
Sodium: 135 mmol/L (ref 135–145)
Total Bilirubin: 0.4 mg/dL (ref 0.3–1.2)
Total Protein: 7 g/dL (ref 6.5–8.1)

## 2022-08-27 LAB — CBC WITH DIFFERENTIAL/PLATELET
Abs Immature Granulocytes: 0.02 10*3/uL (ref 0.00–0.07)
Basophils Absolute: 0.1 10*3/uL (ref 0.0–0.1)
Basophils Relative: 1 %
Eosinophils Absolute: 0.1 10*3/uL (ref 0.0–0.5)
Eosinophils Relative: 2 %
HCT: 42 % (ref 36.0–46.0)
Hemoglobin: 14 g/dL (ref 12.0–15.0)
Immature Granulocytes: 0 %
Lymphocytes Relative: 31 %
Lymphs Abs: 2.8 10*3/uL (ref 0.7–4.0)
MCH: 29.8 pg (ref 26.0–34.0)
MCHC: 33.3 g/dL (ref 30.0–36.0)
MCV: 89.4 fL (ref 80.0–100.0)
Monocytes Absolute: 0.9 10*3/uL (ref 0.1–1.0)
Monocytes Relative: 10 %
Neutro Abs: 5.3 10*3/uL (ref 1.7–7.7)
Neutrophils Relative %: 56 %
Platelets: 322 10*3/uL (ref 150–400)
RBC: 4.7 MIL/uL (ref 3.87–5.11)
RDW: 13.2 % (ref 11.5–15.5)
WBC: 9.2 10*3/uL (ref 4.0–10.5)
nRBC: 0 % (ref 0.0–0.2)

## 2022-08-27 LAB — PROTIME-INR
INR: 1.1 (ref 0.8–1.2)
Prothrombin Time: 14.1 seconds (ref 11.4–15.2)

## 2022-09-01 ENCOUNTER — Ambulatory Visit (HOSPITAL_BASED_OUTPATIENT_CLINIC_OR_DEPARTMENT_OTHER): Payer: 59 | Admitting: Certified Registered Nurse Anesthetist

## 2022-09-01 ENCOUNTER — Encounter (HOSPITAL_COMMUNITY)
Admission: RE | Disposition: A | Discharge status: Home / Self Care | Payer: Self-pay | Source: Ambulatory Visit | Attending: Gastroenterology

## 2022-09-01 ENCOUNTER — Ambulatory Visit (HOSPITAL_COMMUNITY): Payer: 59 | Admitting: Certified Registered Nurse Anesthetist

## 2022-09-01 ENCOUNTER — Ambulatory Visit (HOSPITAL_COMMUNITY)
Admission: RE | Admit: 2022-09-01 | Discharge: 2022-09-01 | Disposition: A | Discharge status: Home / Self Care | Payer: 59 | Source: Ambulatory Visit | Attending: Gastroenterology | Admitting: Gastroenterology

## 2022-09-01 DIAGNOSIS — K648 Other hemorrhoids: Secondary | ICD-10-CM | POA: Diagnosis not present

## 2022-09-01 DIAGNOSIS — Z860100 Personal history of colon polyps, unspecified: Secondary | ICD-10-CM

## 2022-09-01 DIAGNOSIS — I2510 Atherosclerotic heart disease of native coronary artery without angina pectoris: Secondary | ICD-10-CM | POA: Insufficient documentation

## 2022-09-01 DIAGNOSIS — Z853 Personal history of malignant neoplasm of breast: Secondary | ICD-10-CM | POA: Diagnosis not present

## 2022-09-01 DIAGNOSIS — Z950 Presence of cardiac pacemaker: Secondary | ICD-10-CM | POA: Insufficient documentation

## 2022-09-01 DIAGNOSIS — F17200 Nicotine dependence, unspecified, uncomplicated: Secondary | ICD-10-CM | POA: Diagnosis not present

## 2022-09-01 DIAGNOSIS — J449 Chronic obstructive pulmonary disease, unspecified: Secondary | ICD-10-CM | POA: Diagnosis not present

## 2022-09-01 DIAGNOSIS — Z955 Presence of coronary angioplasty implant and graft: Secondary | ICD-10-CM | POA: Insufficient documentation

## 2022-09-01 DIAGNOSIS — Z09 Encounter for follow-up examination after completed treatment for conditions other than malignant neoplasm: Secondary | ICD-10-CM | POA: Diagnosis present

## 2022-09-01 DIAGNOSIS — E039 Hypothyroidism, unspecified: Secondary | ICD-10-CM | POA: Insufficient documentation

## 2022-09-01 DIAGNOSIS — Z7984 Long term (current) use of oral hypoglycemic drugs: Secondary | ICD-10-CM | POA: Insufficient documentation

## 2022-09-01 DIAGNOSIS — E785 Hyperlipidemia, unspecified: Secondary | ICD-10-CM | POA: Insufficient documentation

## 2022-09-01 DIAGNOSIS — K635 Polyp of colon: Secondary | ICD-10-CM | POA: Insufficient documentation

## 2022-09-01 DIAGNOSIS — I6930 Unspecified sequelae of cerebral infarction: Secondary | ICD-10-CM | POA: Diagnosis not present

## 2022-09-01 DIAGNOSIS — E669 Obesity, unspecified: Secondary | ICD-10-CM | POA: Diagnosis not present

## 2022-09-01 DIAGNOSIS — I509 Heart failure, unspecified: Secondary | ICD-10-CM | POA: Insufficient documentation

## 2022-09-01 DIAGNOSIS — F32A Depression, unspecified: Secondary | ICD-10-CM | POA: Diagnosis not present

## 2022-09-01 DIAGNOSIS — G8929 Other chronic pain: Secondary | ICD-10-CM | POA: Insufficient documentation

## 2022-09-01 DIAGNOSIS — I110 Hypertensive heart disease with heart failure: Secondary | ICD-10-CM | POA: Diagnosis not present

## 2022-09-01 DIAGNOSIS — K7460 Unspecified cirrhosis of liver: Secondary | ICD-10-CM | POA: Insufficient documentation

## 2022-09-01 DIAGNOSIS — E119 Type 2 diabetes mellitus without complications: Secondary | ICD-10-CM | POA: Insufficient documentation

## 2022-09-01 DIAGNOSIS — Z1211 Encounter for screening for malignant neoplasm of colon: Secondary | ICD-10-CM

## 2022-09-01 DIAGNOSIS — D126 Benign neoplasm of colon, unspecified: Secondary | ICD-10-CM

## 2022-09-01 DIAGNOSIS — D122 Benign neoplasm of ascending colon: Secondary | ICD-10-CM | POA: Diagnosis not present

## 2022-09-01 DIAGNOSIS — D123 Benign neoplasm of transverse colon: Secondary | ICD-10-CM | POA: Insufficient documentation

## 2022-09-01 DIAGNOSIS — K5730 Diverticulosis of large intestine without perforation or abscess without bleeding: Secondary | ICD-10-CM | POA: Insufficient documentation

## 2022-09-01 DIAGNOSIS — Z85820 Personal history of malignant melanoma of skin: Secondary | ICD-10-CM | POA: Insufficient documentation

## 2022-09-01 DIAGNOSIS — K219 Gastro-esophageal reflux disease without esophagitis: Secondary | ICD-10-CM | POA: Diagnosis not present

## 2022-09-01 DIAGNOSIS — F418 Other specified anxiety disorders: Secondary | ICD-10-CM

## 2022-09-01 DIAGNOSIS — Z79899 Other long term (current) drug therapy: Secondary | ICD-10-CM | POA: Insufficient documentation

## 2022-09-01 HISTORY — PX: COLONOSCOPY WITH PROPOFOL: SHX5780

## 2022-09-01 HISTORY — PX: HEMOSTASIS CLIP PLACEMENT: SHX6857

## 2022-09-01 HISTORY — PX: POLYPECTOMY: SHX149

## 2022-09-01 HISTORY — PX: SUBMUCOSAL LIFTING INJECTION: SHX6855

## 2022-09-01 HISTORY — PX: SUBMUCOSAL TATTOO INJECTION: SHX6856

## 2022-09-01 LAB — HM COLONOSCOPY

## 2022-09-01 LAB — GLUCOSE, CAPILLARY: Glucose-Capillary: 94 mg/dL (ref 70–99)

## 2022-09-01 SURGERY — COLONOSCOPY WITH PROPOFOL
Anesthesia: General

## 2022-09-01 MED ORDER — LACTATED RINGERS IV SOLN: INTRAVENOUS | Status: DC

## 2022-09-01 MED ORDER — PROPOFOL 10 MG/ML IV BOLUS
INTRAVENOUS | Status: DC | PRN
  Administered 2022-09-01 (×3): 40 mg via INTRAVENOUS
  Administered 2022-09-01: 60 mg via INTRAVENOUS
  Administered 2022-09-01: 30 mg via INTRAVENOUS
  Administered 2022-09-01: 10 mg via INTRAVENOUS
  Administered 2022-09-01: 40 mg via INTRAVENOUS
  Administered 2022-09-01 (×2): 20 mg via INTRAVENOUS
  Administered 2022-09-01 (×2): 40 mg via INTRAVENOUS
  Administered 2022-09-01: 110 mg via INTRAVENOUS

## 2022-09-01 MED ORDER — LIDOCAINE HCL (CARDIAC) PF 100 MG/5ML IV SOSY
PREFILLED_SYRINGE | INTRAVENOUS | Status: DC | PRN
  Administered 2022-09-01: 50 mg via INTRAVENOUS

## 2022-09-01 MED ORDER — SPOT INK MARKER SYRINGE KIT
PACK | SUBMUCOSAL | Status: DC | PRN
  Administered 2022-09-01: 1 mL via SUBMUCOSAL

## 2022-09-01 NOTE — Op Note (Signed)
[facility] Patient Name: [name] Procedure Date: 09/01/2022 12:21 PM MRN: [id number] Date of Birth: 18-Mar-1950 Attending MD: Katrinka Blazing , , [id number] CSN: [id number] Age: 72 Admit Type: Outpatient Procedure:                Colonoscopy Indications:              Surveillance: Personal history of adenomatous                            polyps on last colonoscopy > 3 years ago Providers:                Katrinka Blazing, Crystal Page, Lennice Sites                            Technician, Technician Referring MD:              Medicines:                Monitored Anesthesia Care Complications:            No immediate complications. Estimated Blood Loss:     Estimated blood loss: none. Procedure:                Pre-Anesthesia Assessment:                           - Prior to the procedure, a History and Physical                            was performed, and patient medications, allergies                            and sensitivities were reviewed. The patient's                            tolerance of previous anesthesia was reviewed.                           - The risks and benefits of the procedure and the                            sedation options and risks were discussed with the                            patient. All questions were answered and informed                            consent was obtained.                           - ASA Grade Assessment: III - A patient with severe                            systemic disease.                           After obtaining informed consent, the colonoscope  was passed under direct vision. Throughout the                            procedure, the patient's blood pressure, pulse, and                            oxygen saturations were monitored continuously. The                            [id number]-HQ190L ([id number]) scope was introduced through                            the anus and advanced to the the cecum,  identified                            by appendiceal orifice and ileocecal valve. The                            colonoscopy was performed without difficulty. The                            patient tolerated the procedure well. The quality                            of the bowel preparation was adequate. Scope In: 1:11:51 PM Scope Out: 2:14:56 PM Scope Withdrawal Time: 0 hours 42 minutes 32 seconds  Total Procedure Duration: 1 hour 3 minutes 5 seconds  Findings:      Hemorrhoids were found on perianal exam.      A 12 to 14 mm polyp was found in the ascending colon. The polyp was       flat. Area was successfully injected with 6.5 mL of a 0.1 mg/mL solution       of epinephrine for a lift polypectomy. Imaging was performed using white       light and narrow band imaging to visualize the mucosa and demarcate the       polyp site after injection for EMR purposes. The polyp was removed in       two pieces with a piecemeal technique using a hot snare. Resection and       retrieval were complete. To prevent bleeding after the polypectomy, two       hemostatic clips were successfully placed. Clip manufacturer: [employer]. There was no bleeding at the end of the procedure. Area 1 cm       distal and contralateral was tattooed with an injection of 1 mL of [place]       ink.      Three sessile polyps were found in the ascending colon. The polyps were       3 to 6 mm in size. These polyps were removed with a cold snare.       Resection and retrieval were complete.      A 12 mm polyp was found in the transverse colon. The polyp was       semi-sessile. Area was successfully injected with 3.5 mL Eleview for a       lift polypectomy. Imaging was performed using white  light and narrow       band imaging to visualize the mucosa and demarcate the polyp site after       injection for EMR purposes. The polyp was removed with a hot snare.       Resection and retrieval were complete. To prevent  bleeding after the       polypectomy, one hemostatic clip was successfully placed. Clip       manufacturer: [employer]. There was no bleeding at the end of the       procedure.      Scattered medium-mouthed and small-mouthed diverticula were found in the       sigmoid colon and descending colon.      Non-bleeding internal hemorrhoids were found during retroflexion. The       hemorrhoids were small. Impression:               - Hemorrhoids found on perianal exam.                           - One 12 to 14 mm polyp in the ascending colon,                            removed piecemeal using a hot snare. Resected and                            retrieved. Injected. Clips were placed. Clip                            manufacturer: [employer]. Tattooed.                           - Three 3 to 6 mm polyps in the ascending colon,                            removed with a cold snare. Resected and retrieved.                           - One 12 mm polyp in the transverse colon, removed                            with a hot snare. Resected and retrieved. Injected.                            Clip was placed. Clip manufacturer: [employer].                           - Diverticulosis in the sigmoid colon and in the                            descending colon.                           - Non-bleeding internal hemorrhoids. Moderate Sedation:  Per Anesthesia Care Recommendation:           - Discharge patient to home (ambulatory).                           - Resume previous diet.                           - Await pathology results.                           - Repeat colonoscopy in 2 years for surveillance                            after piecemeal polypectomy.                           - Restart Plavix in 2 days Procedure Code(s):        --- Professional ---                           [phone], 59, Colonoscopy, flexible; with removal of                             tumor(s), polyp(s), or other lesion(s) by snare                            technique                           45381, Colonoscopy, flexible; with directed                            submucosal injection(s), any substance Diagnosis Code(s):        --- Professional ---                           Z86.010, Personal history of colonic polyps                           D12.2, Benign neoplasm of ascending colon                           D12.3, Benign neoplasm of transverse colon (hepatic                            flexure or splenic flexure)                           K64.8, Other hemorrhoids                           K57.30, Diverticulosis of large intestine without                            perforation or abscess without bleeding CPT copyright 2022 American Medical Association. All rights reserved. The codes documented in this report are preliminary and upon coder review  may  be revised to meet current compliance requirements. Katrinka Blazing, MD Katrinka Blazing,  09/01/2022 2:24:21 PM This report has been signed electronically. Number of Addenda: 0

## 2022-09-01 NOTE — Transfer of Care (Signed)
Immediate Anesthesia Transfer of Care Note  Patient: [name]  Procedure(s) Performed: COLONOSCOPY WITH PROPOFOL POLYPECTOMY INTESTINAL SUBMUCOSAL LIFTING INJECTION HEMOSTASIS CLIP PLACEMENT SUBMUCOSAL TATTOO INJECTION  Patient Location: Short Stay  Anesthesia Type:General  Level of Consciousness: awake  Airway & Oxygen Therapy: Patient Spontanous Breathing  Post-op Assessment: Report given to RN and Post -op Vital signs reviewed and stable  Post vital signs: Reviewed and stable  Last Vitals:  Vitals Value Taken Time  BP 99/56 09/01/22 1421  Temp 36.6 C 09/01/22 1421  Pulse 61 09/01/22 1421  Resp 11 09/01/22 1421  SpO2 94 % 09/01/22 1421    Last Pain:  Vitals:   09/01/22 1421  TempSrc: Oral  PainSc: 0-No pain         Complications: No notable events documented.

## 2022-09-01 NOTE — Anesthesia Preprocedure Evaluation (Signed)
Anesthesia Evaluation  Patient identified by MRN, date of birth, ID band Patient awake    Reviewed: Allergy & Precautions, H&P , NPO status , Patient's Chart, lab work & pertinent test results  Airway Mallampati: II  TM Distance: >3 FB Neck ROM: Full    Dental  (+) Partial Lower, Partial Upper   Pulmonary COPD,  COPD inhaler, Current Smoker and Patient abstained from smoking.   Pulmonary exam normal breath sounds clear to auscultation       Cardiovascular Exercise Tolerance: Good hypertension, Pt. on medications + CAD, + Past MI, + Cardiac Stents and +CHF  Normal cardiovascular exam+ dysrhythmias + pacemaker + Valvular Problems/Murmurs  Rhythm:Regular Rate:Normal     Neuro/Psych  Headaches PSYCHIATRIC DISORDERS Anxiety Depression    CVA, Residual Symptoms    GI/Hepatic hiatal hernia,GERD  Medicated and Controlled,,(+) Cirrhosis       , Hepatitis -  Endo/Other  diabetes, Well Controlled, Type 2, Oral Hypoglycemic AgentsHypothyroidism    Renal/GU Renal disease  negative genitourinary   Musculoskeletal  (+) Arthritis , Osteoarthritis,    Abdominal   Peds negative pediatric ROS (+)  Hematology negative hematology ROS (+)   Anesthesia Other Findings Left breast cancer  Reproductive/Obstetrics negative OB ROS                             Anesthesia Physical Anesthesia Plan  ASA: 3  Anesthesia Plan: General   Post-op Pain Management: Minimal or no pain anticipated   Induction: Intravenous  PONV Risk Score and Plan: 1 and Propofol infusion  Airway Management Planned: Nasal Cannula and Natural Airway  Additional Equipment:   Intra-op Plan:   Post-operative Plan:   Informed Consent: I have reviewed the patients History and Physical, chart, labs and discussed the procedure including the risks, benefits and alternatives for the proposed anesthesia with the patient or authorized  representative who has indicated his/her understanding and acceptance.     Dental advisory given  Plan Discussed with: CRNA and Surgeon  Anesthesia Plan Comments:        Anesthesia Quick Evaluation

## 2022-09-01 NOTE — H&P (Signed)
[name] is an 72 y.o. female.   Chief Complaint: history colon polyps HPI: [name] is a 72 y.o. 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  The patient denies having any nausea, vomiting, fever, chills, hematochezia, melena, hematemesis, abdominal distention, abdominal pain, diarrhea, jaundice, pruritus or weight loss.  Past Medical History:  Diagnosis Date   Anxiety    Arthritis    "hands, arms, back, neck, knees, fingers" (01/21/2016)   Bradycardia    Breast cancer, left breast (HCC) 1970s; 2015   Cancer of skin of leg    BLE   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HCC)    left   Chronic lower back pain    Chronic pain    COPD (chronic obstructive pulmonary disease) (HCC)    Coronary artery disease    Depression    GERD (gastroesophageal reflux disease)    Heart murmur    Hepatic cirrhosis (HCC) 12/20/2017   Hepatitis    History of blood transfusion    "related to spleen"   History of hiatal hernia    History of kidney stones    Hyperlipidemia    Hypertension    Hypothyroidism    Kidney stone    Melanoma of forearm, left (HCC)    Migraine    "a few migraines/year" (01/21/2016)   Obesity    Stroke (HCC) 04/2003   2 carotid artery stents in place; small left parietal and left hemispheric CVA.[name] 01/21/2016   Tobacco use disorder    50 pack years; questionably discontinued in 2010   Wears glasses     Past Surgical History:  Procedure Laterality Date   ABDOMINAL HYSTERECTOMY  1978   APPENDECTOMY     BIOPSY  04/28/2022   Procedure: BIOPSY;  Surgeon: Marguerita Merles, Reuel Boom, MD;  [facility];  Service: Gastroenterology;;   BREAST BIOPSY Left 1970s;  2015   BREAST LUMPECTOMY Left 1970s   BREAST LUMPECTOMY WITH NEEDLE LOCALIZATION Left 07/07/2013   Procedure: BREAST LUMPECTOMY WITH NEEDLE LOCALIZATION;  Surgeon: Robyne Askew, MD;  Location: [facility];  Service: General;  Laterality: Left;   CARDIAC CATHETERIZATION N/A 01/22/2016   Procedure: Left Heart Cath and Coronary Angiography;  Surgeon: Peter M Swaziland, MD;  Location: [facility] INVASIVE CV LAB;  Service: Cardiovascular;  Laterality: N/A;   CARDIAC CATHETERIZATION N/A 01/22/2016   Procedure: Coronary Stent Intervention;  Surgeon: Peter M Swaziland, MD;  Location: [facility] INVASIVE CV LAB;  Service: Cardiovascular;  Laterality: N/A;   CAROTID STENT Left 04/2003   small left parietal and left hemispheric CVA/notes 07/29/2010   CHOLECYSTECTOMY OPEN     COLONOSCOPY  2011   COLONOSCOPY N/A 05/10/2019   Procedure: COLONOSCOPY;  Surgeon: Malissa Hippo, MD;  [facility];  Service: Endoscopy;  Laterality: N/A;  1:45   CORONARY ATHERECTOMY N/A 12/29/2019   Procedure: CORONARY ATHERECTOMY;  Surgeon: Corky Crafts, MD;  Location: [facility] INVASIVE CV LAB;  Service: Cardiovascular;  Laterality: N/A;  Prox RCA   CORONARY STENT INTERVENTION N/A 12/29/2019   Procedure: CORONARY STENT INTERVENTION;  Surgeon: Corky Crafts, MD;  Location: [facility] INVASIVE CV LAB;  Service: Cardiovascular;  Laterality: N/A;  Prox RCA   CORONARY ULTRASOUND/IVUS N/A 12/29/2019   Procedure: Intravascular Ultrasound/IVUS;  Surgeon: Lance Muss  S, MD;  Location: [facility] INVASIVE CV LAB;  Service: Cardiovascular;  Laterality: N/A;   ESOPHAGEAL DILATION N/A 04/28/2022   Procedure: ESOPHAGEAL DILATION;  Surgeon: Dolores Frame, MD;  [facility];  Service: Gastroenterology;  Laterality: N/A;   ESOPHAGOGASTRODUODENOSCOPY  08/06/2011   Procedure: ESOPHAGOGASTRODUODENOSCOPY (EGD);  Surgeon: Malissa Hippo, MD;  [facility];  Service: Endoscopy;  Laterality: N/A;    ESOPHAGOGASTRODUODENOSCOPY N/A 05/10/2019   Procedure: ESOPHAGOGASTRODUODENOSCOPY (EGD);  Surgeon: Malissa Hippo, MD;  [facility];  Service: Endoscopy;  Laterality: N/A;   ESOPHAGOGASTRODUODENOSCOPY (EGD) WITH PROPOFOL N/A 04/28/2022   Procedure: ESOPHAGOGASTRODUODENOSCOPY (EGD) WITH PROPOFOL;  Surgeon: Dolores Frame, MD;  [facility];  Service: Gastroenterology;  Laterality: N/A;  10:30 am, pt can't move up due to transportation   FLEXIBLE SIGMOIDOSCOPY  08/06/2011   Procedure: FLEXIBLE SIGMOIDOSCOPY;  Surgeon: Malissa Hippo, MD;  [facility];  Service: Endoscopy;  Laterality: N/A;   INGUINAL HERNIA REPAIR Left 1970s   IR ANGIO INTRA EXTRACRAN SEL COM CAROTID INNOMINATE BILAT MOD SED  11/01/2020   IR ANGIO VERTEBRAL SEL VERTEBRAL BILAT MOD SED  11/01/2020   IR RADIOLOGIST EVAL & MGMT  09/23/2020   IR US GUIDE VASC ACCESS RIGHT  11/01/2020   LARYNX SURGERY  1970s   Polyps excised   LEFT HEART CATH AND CORONARY ANGIOGRAPHY N/A 12/29/2019   Procedure: LEFT HEART CATH AND CORONARY ANGIOGRAPHY;  Surgeon: Corky Crafts, MD;  Location: [facility]VASIVE CV LAB;  Service: Cardiovascular;  Laterality: N/A;   MALONEY DILATION  05/10/2019   Procedure: [name] DILATION;  Surgeon: Malissa Hippo, MD;  [facility];  Service: Endoscopy;;   MELANOMA EXCISION Left ~ 2016   forearm   POLYPECTOMY  05/10/2019   Procedure: POLYPECTOMY;  Surgeon: Malissa Hippo, MD;  [facility]: [facility];  Service: Endoscopy;;  colon   SPLENECTOMY, 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   spontaneous rupture   TEMPORARY PACEMAKER N/A 12/29/2019   Procedure: TEMPORARY PACEMAKER;  Surgeon: Corky Crafts, MD;  Location: [facility] INVASIVE CV LAB;  Service: Cardiovascular;  Laterality: N/A;    Family History  Problem Relation Age of Onset   Heart failure Mother    Cancer Father    Heart failure Father    Cancer Sister    Dementia Sister    Heart disease Other    Arthritis Other     Cancer Other    Diabetes Other    Kidney disease Other    Cancer Sister    Heart failure Brother    Social History:  reports that she has been smoking cigarettes. She has a 20.00 pack-year smoking history. She has been exposed to tobacco smoke. She has never used smokeless tobacco. She reports that she does not drink alcohol and does not use drugs.  Allergies:  Allergies  Allergen Reactions   Gabapentin Nausea And Vomiting   Prednisone Palpitations   Carvedilol     Stopped due to bradycardia   Clonidine Derivatives     Stopped due to bradycardia    Diltiazem     Stopped due to bradycardia    Tape Other (See Comments)    Causes blisters to form   Latex Rash and Other (See Comments)    Blisters    Medications Prior to Admission  Medication Sig Dispense Refill   amLODipine (NORVASC) 5 MG tablet TAKE 1 TABLET BY MOUTH ONCE DAILY. 90 tablet 0   aspirin EC 81  MG tablet Take 1 tablet (81 mg total) by mouth daily.     atorvastatin (LIPITOR) 80 MG tablet TAKE ONE TABLET BY MOUTH DAILY AT 6 P.M. 90 tablet 3   azaTHIOprine (IMURAN) 50 MG tablet Take 1 tablet (50 mg total) by mouth daily. 90 tablet 1   cetirizine (ZYRTEC) 10 MG tablet Take 10 mg by mouth daily.     Cholecalciferol (VITAMIN D3 SUPER STRENGTH) 50 MCG (2000 UT) CAPS Take 2,000 Units by mouth daily.     clorazepate (TRANXENE) 7.5 MG tablet Take 7.5 mg by mouth 3 (three) times daily as needed for anxiety.      docusate sodium (COLACE) 100 MG capsule Take 200 mg by mouth at bedtime.     famotidine (PEPCID) 20 MG tablet Take 1 tablet (20 mg total) by mouth at bedtime. 30 tablet 11   furosemide (LASIX) 40 MG tablet Take 40 mg by mouth daily as needed for fluid (leg/hand swelling.).     isosorbide mononitrate (IMDUR) 120 MG 24 hr tablet TAKE ONE TABLET BY MOUTH ONCE DAILY. 90 tablet 0   levothyroxine (SYNTHROID, LEVOTHROID) 50 MCG tablet Take 50 mcg by mouth daily before breakfast.      losartan (COZAAR) 25 MG tablet Take 1  tablet (25 mg total) by mouth daily. 30 tablet 6   pantoprazole (PROTONIX) 40 MG tablet TAKE 1 TABLET BY MOUTH ONCE A DAY. 90 tablet 0   potassium chloride SA (KLOR-CON M) 20 MEQ tablet Take 1 tablet (20 mEq total) by mouth daily as needed (Take only when you take Lasix). 30 tablet 11   ursodiol (ACTIGALL) 500 MG tablet Take 1 tablet (500 mg total) by mouth 2 (two) times daily. 180 tablet 3   albuterol (PROVENTIL HFA;VENTOLIN HFA) 108 (90 Base) MCG/ACT inhaler Inhale 2 puffs into the lungs every 4 (four) hours as needed for wheezing or shortness of breath.     albuterol (PROVENTIL) (2.5 MG/3ML) 0.083% nebulizer solution Take 2.5 mg by nebulization every 6 (six) hours as needed for wheezing or shortness of breath.     ALPRAZolam (XANAX) 1 MG tablet Take 0.5-1 mg by mouth at bedtime as needed for sleep.     aspirin-acetaminophen-caffeine (EXCEDRIN MIGRAINE) 250-250-65 MG tablet Take 1 tablet by mouth daily as needed for headache.     clopidogrel (PLAVIX) 75 MG tablet TAKE (1) TABLET BY MOUTH ONCE DAILY. 90 tablet 1   fluticasone (FLONASE) 50 MCG/ACT nasal spray Place 2 sprays into both nostrils as needed for allergies. (Patient not taking: Reported on 07/13/2022)     HYDROcodone-acetaminophen (NORCO) 10-325 MG tablet Take 1 tablet by mouth every 4 (four) hours as needed for moderate pain.     JARDIANCE 10 MG TABS tablet Take 10 mg by mouth every morning.     Ketotifen Fumarate (ALLERGY EYE DROPS OP) Place 1 drop into both eyes daily as needed (allergies).     nitroGLYCERIN (NITROSTAT) 0.4 MG SL tablet Place 0.4 mg under the tongue every 5 (five) minutes as needed for chest pain.       ondansetron (ZOFRAN) 4 MG tablet Take 1 tablet (4 mg total) by mouth 3 (three) times daily as needed. 30 tablet 1    Results for orders placed or performed during the hospital encounter of 09/01/22 (from the past 48 hour(s))  Glucose, capillary     Status: None   Collection Time: 09/01/22 12:07 PM  Result Value Ref  Range   Glucose-Capillary 94 70 - 99 mg/dL  Comment: Glucose reference range applies only to samples taken after fasting for at least 8 hours.   No results found.  Review of Systems  All other systems reviewed and are negative.   Pulse 78, resp. rate 13, SpO2 96 %. Physical Exam  GENERAL: The patient is AO x3, in no acute distress. HEENT: Head is normocephalic and atraumatic. EOMI are intact. Mouth is well hydrated and without lesions. NECK: Supple. No masses LUNGS: Clear to auscultation. No presence of rhonchi/wheezing/rales. Adequate chest expansion HEART: RRR, normal s1 and s2. ABDOMEN: Soft, nontender, no guarding, no peritoneal signs, and nondistended. BS +. No masses. EXTREMITIES: Without any cyanosis, clubbing, rash, lesions or edema. NEUROLOGIC: AOx3, no focal motor deficit. SKIN: no jaundice, no rashes  Assessment/Plan  [name] is a 72 y.o. 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  We will proceed with colonoscopy  Dolores Frame, MD 09/01/2022, 12:25 PM

## 2022-09-01 NOTE — Anesthesia Procedure Notes (Signed)
Date/Time: 09/01/2022 1:06 PM  Performed by: Franco Nones, CRNAPre-anesthesia Checklist: Patient identified, Emergency Drugs available, Suction available, Timeout performed and Patient being monitored Patient Re-evaluated:Patient Re-evaluated prior to induction Oxygen Delivery Method: Nasal Cannula

## 2022-09-01 NOTE — Anesthesia Postprocedure Evaluation (Signed)
Anesthesia Post Note  Patient: [name]  Procedure(s) Performed: COLONOSCOPY WITH PROPOFOL POLYPECTOMY INTESTINAL SUBMUCOSAL LIFTING INJECTION HEMOSTASIS CLIP PLACEMENT SUBMUCOSAL TATTOO INJECTION  Patient location during evaluation: Phase II Anesthesia Type: General Level of consciousness: awake and alert and oriented Pain management: pain level controlled Vital Signs Assessment: post-procedure vital signs reviewed and stable Respiratory status: spontaneous breathing, nonlabored ventilation and respiratory function stable Cardiovascular status: blood pressure returned to baseline and stable Postop Assessment: no apparent nausea or vomiting Anesthetic complications: no  No notable events documented.   Last Vitals:  Vitals:   09/01/22 1217 09/01/22 1421  BP: (!) 143/79 (!) 99/56  Pulse: 78 61  Resp: 13 11  Temp: 36.8 C 36.6 C  SpO2: 96% 94%    Last Pain:  Vitals:   09/01/22 1421  TempSrc: Oral  PainSc: 0-No pain                 [name] C Jory Welke

## 2022-09-01 NOTE — Discharge Instructions (Signed)
You are being discharged to home.  Resume your previous diet.  We are waiting for your pathology results.  Your physician has recommended a repeat colonoscopy in two years for surveillance after today's piecemeal polypectomy.  Restart Plavix in 2 days

## 2022-09-03 LAB — SURGICAL PATHOLOGY

## 2022-09-06 ENCOUNTER — Other Ambulatory Visit: Payer: Self-pay | Admitting: Cardiology

## 2022-09-07 ENCOUNTER — Encounter (INDEPENDENT_AMBULATORY_CARE_PROVIDER_SITE_OTHER): Payer: Self-pay | Admitting: *Deleted

## 2022-09-07 ENCOUNTER — Encounter (HOSPITAL_COMMUNITY): Payer: Self-pay | Admitting: Gastroenterology

## 2022-09-11 ENCOUNTER — Encounter (INDEPENDENT_AMBULATORY_CARE_PROVIDER_SITE_OTHER): Payer: Self-pay | Admitting: *Deleted

## 2022-09-15 ENCOUNTER — Telehealth (INDEPENDENT_AMBULATORY_CARE_PROVIDER_SITE_OTHER): Payer: Self-pay | Admitting: *Deleted

## 2022-09-15 ENCOUNTER — Other Ambulatory Visit: Payer: Self-pay | Admitting: Cardiology

## 2022-09-15 NOTE — Telephone Encounter (Signed)
Left message to return call 

## 2022-09-15 NOTE — Telephone Encounter (Signed)
Pt called and wanted to know how long would it be before the clamps you put in would come out? Had tcs on 09/01/22.

## 2022-09-15 NOTE — Telephone Encounter (Signed)
May take up to 6 months. If she needs an MRI, she will need to have an abdominal xray first to make sure they fell off.

## 2022-09-15 NOTE — Telephone Encounter (Signed)
Discussed with patient per Levon Hedger - May take up to 6 months. If she needs an MRI, she will need to have an abdominal xray first to make sure they fell off.  Patient verbalized  understanding.

## 2022-09-15 NOTE — Telephone Encounter (Signed)
May take up to 6 months. If she needs an MRI, she will need to have an abdominal xray first to make sure they fell off.  

## 2022-10-02 ENCOUNTER — Encounter: Payer: Self-pay | Admitting: Student

## 2022-10-02 ENCOUNTER — Telehealth: Payer: Self-pay | Admitting: Cardiology

## 2022-10-02 ENCOUNTER — Other Ambulatory Visit
Admission: RE | Admit: 2022-10-02 | Discharge: 2022-10-02 | Disposition: A | Discharge status: Home / Self Care | Payer: 59 | Source: Ambulatory Visit | Attending: Student | Admitting: Student

## 2022-10-02 ENCOUNTER — Ambulatory Visit (INDEPENDENT_AMBULATORY_CARE_PROVIDER_SITE_OTHER): Payer: 59 | Admitting: Student

## 2022-10-02 VITALS — BP 122/60 | HR 76 | Ht 63.0 in | Wt 183.2 lb

## 2022-10-02 DIAGNOSIS — I5032 Chronic diastolic (congestive) heart failure: Secondary | ICD-10-CM | POA: Diagnosis not present

## 2022-10-02 DIAGNOSIS — I10 Essential (primary) hypertension: Secondary | ICD-10-CM | POA: Diagnosis not present

## 2022-10-02 DIAGNOSIS — I25110 Atherosclerotic heart disease of native coronary artery with unstable angina pectoris: Secondary | ICD-10-CM | POA: Diagnosis not present

## 2022-10-02 DIAGNOSIS — I25119 Atherosclerotic heart disease of native coronary artery with unspecified angina pectoris: Secondary | ICD-10-CM

## 2022-10-02 DIAGNOSIS — Z8673 Personal history of transient ischemic attack (TIA), and cerebral infarction without residual deficits: Secondary | ICD-10-CM

## 2022-10-02 DIAGNOSIS — E785 Hyperlipidemia, unspecified: Secondary | ICD-10-CM

## 2022-10-02 LAB — BASIC METABOLIC PANEL
Anion gap: 8 (ref 5–15)
BUN: 13 mg/dL (ref 8–23)
CO2: 28 mmol/L (ref 22–32)
Calcium: 9 mg/dL (ref 8.9–10.3)
Chloride: 101 mmol/L (ref 98–111)
Creatinine, Ser: 0.9 mg/dL (ref 0.44–1.00)
GFR, Estimated: >60 mL/min (ref >60)
Glucose, Bld: 94 mg/dL (ref 70–99)
Potassium: 3.6 mmol/L (ref 3.5–5.1)
Sodium: 137 mmol/L (ref 135–145)

## 2022-10-02 LAB — CBC
HCT: 44.1 % (ref 36.0–46.0)
Hemoglobin: 14.6 g/dL (ref 12.0–15.0)
MCH: 29.8 pg (ref 26.0–34.0)
MCHC: 33.1 g/dL (ref 30.0–36.0)
MCV: 90 fL (ref 80.0–100.0)
Platelets: 307 10*3/uL (ref 150–400)
RBC: 4.9 MIL/uL (ref 3.87–5.11)
RDW: 13.4 % (ref 11.5–15.5)
WBC: 7.9 10*3/uL (ref 4.0–10.5)
nRBC: 0 % (ref 0.0–0.2)

## 2022-10-02 MED ORDER — ISOSORBIDE MONONITRATE ER 120 MG PO TB24: 120.0000 mg | ORAL_TABLET | Freq: Every day | ORAL | 0 refills | Status: DC

## 2022-10-02 NOTE — H&P (View-Only) (Signed)
Cardiology Office Note    Date:  10/02/2022  ID:  [name], DOB [birth year], MRN [id number] Cardiologist: Dina Rich, MD    History of Present Illness:    [name] is a 72 y.o. 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    She was last examined by Ronie Spies, PA in 06/2022 and reported overall doing well within the past 6 months and had only used SL NTG on one occasion. Reported being active at baseline without anginal symptoms. She was cleared to proceed with a colonoscopy from a cardiac perspective and no changes were made to her medications at that time.   In talking with the patient today, she reports having more frequent episodes of chest pain over the past 2-3 weeks. Reports this feels like a pressure and radiates into her left arm at times. Also develops diaphoresis when this occurs. Symptoms last for 10-15 minutes and resolve with SL NTG. Last episode was 3 days ago. Says she previously rarely used SL NTG but has used several over the past 2 weeks. She denies any palpitations, orthopnea, PND or pitting edema. Does report worsening dyspnea and fatigue as well. She has been compliant with her medications.    Studies Reviewed:   EKG: EKG is ordered today and demonstrates:   EKG Interpretation Date/Time:  Friday October 02 2022 14:30:17 EDT Ventricular Rate:  74 PR Interval:  174 QRS Duration:  82 QT Interval:  414 QTC Calculation: 459 R Axis:   56  Text Interpretation: Normal sinus rhythm Normal ECG Confirmed by Randall An ([id number]) on 10/02/2022 4:06:15 PM       LHC: 12/2019 Previously placed 1st Mrg drug eluting stent is widely patent. Ost RCA to Prox RCA lesion is 90% stenosed. After  orbital atherectomy drug-eluting stent was successfully placed using a STENT RESOLUTE ONYX 3.0X26, and postdilated to 3.75 mm. Stent was optimized with IVUS. Post intervention, there is a 0% residual stenosis. The left ventricular systolic function is normal. LV end diastolic pressure is normal. The left ventricular ejection fraction is 55-65% by visual estimate. There is no aortic valve stenosis. RCA was prone to vasospasm during the PCI.   Continue dual antiplatelet therapy for at least 6 months.  Consider clopidogrel monotherapy long-term given some diffuse disease in her LAD system.  Continue with attempts at aggressive secondary prevention.   Plan for same day discharge.  NST: 08/2020 Lexiscan stress is electrically negative for ischemia Myoview scan shows mild thinning of anterior wall (mid/distal) with decreased tracer activity consistent with mild anterior ischemia; Cannot exclude however that changes are due to shifting breast attenuation. LVEF calculated at 80% This is a low risk study.  Physical Exam:   VS:  BP 122/60   Pulse 76   Ht 5\' 3"  (1.6 m)   Wt 183 lb 3.2 oz (83.1 kg)   SpO2 93%   BMI 32.45 kg/m    Wt Readings from Last 3 Encounters:  10/02/22 183 lb 3.2 oz (83.1 kg)  08/27/22 181 lb 10.5 oz (82.4 kg)  07/13/22 181 lb 9.6 oz (82.4 kg)     GEN: Well nourished, well developed female appearing in no acute distress NECK: No JVD;  No carotid bruits CARDIAC: RRR, no murmurs, rubs, gallops RESPIRATORY:  Clear to auscultation without rales, wheezing or rhonchi  ABDOMEN: Appears non-distended. No obvious abdominal masses. EXTREMITIES: No clubbing or cyanosis. No pitting edema.  Distal pedal pulses are 2+ bilaterally.   Assessment and Plan:    1. Chest Pain concerning for Accelerating Angina - Her episodes of chest pain, dyspnea and diaphoresis are very concerning for progressive angina. Reviewed options with the patient today and given her high pre-test  probability, I feel that a repeat cardiac catheterization would be the best option as compared to an NST. She is already on optimal medical therapy. She will need to arrange transportation with her Case Worker and will call us back next week in regards to what date/times would work (I also left a voicemail with her Case Worker today in regards to this). I will also review with Dr. Wyline Mood to make sure he is in agreement with a catheterization. The patient understands that risks include but are not limited to stroke (1 in 1000), death (1 in 1000), kidney failure [usually temporary] (1 in 500), bleeding (1 in 200), allergic reaction [possibly serious] (1 in 200). Will proceed with checking pre-procedure CBC and BMET today. We reviewed that if symptoms progress in the interim or she has to take more than 3 SL NTG with one episode, then she should proceed to the Emergency Department.   Addendum: 10/05/2022: Reviewed with Dr. Wyline Mood who is in agreement for catheterization.   2. CAD - She is s/p NSTEMI in 2017 with DES to OM and unsuccessful PCI to RCA with wire dissection. She did undergo DES to RCA in 2021.  - Would plan for repeat ischemic evaluation as discussed above. Continue ASA, Plavix, Atorvastatin, Amlodipine, Imdur and Losartan. Previously intolerant to beta-blocker therapy due to bradycardia.   3. HFpEF - Prior echo in 2017 showed a preserved EF of 65-70%. Can consider updating an echo at her next visit. She appears euvolemic on examination today and only takes Lasix 40mg  as needed. Remains on Jardiance 10mg  daily.   4. HTN - BP is well-controlled at 122/60 during today's visit. Continue Amlodipine, Imdur and Losartan at current dosing.   5. HLD - Followed by her PCP. She remains on Atorvastatin 80mg  daily.   6. History of CVA - Occurred in the setting of carotid artery dissection. Remains on ASA, Plavix and statin therapy.   Signed, Ellsworth Lennox, PA-C

## 2022-10-02 NOTE — Telephone Encounter (Signed)
Apt already made for heartcare made aware. I called patient, no active cp for the past 3 days,pt can be seen in the office.

## 2022-10-02 NOTE — Patient Instructions (Addendum)
Medication Instructions:  Your physician recommends that you continue on your current medications as directed. Please refer to the Current Medication list given to you today.  *If you need a refill on your cardiac medications before your next appointment, please call your pharmacy*   Lab Work: Your physician recommends that you return for lab work in: Today   If you have labs (blood work) drawn today and your tests are completely normal, you will receive your results only by: MyChart Message (if you have MyChart) OR A paper copy in the mail If you have any lab test that is abnormal or we need to change your treatment, we will call you to review the results.   Testing/Procedures: Your physician has requested that you have a cardiac catheterization. Cardiac catheterization is used to diagnose and/or treat various heart conditions. Doctors may recommend this procedure for a number of different reasons. The most common reason is to evaluate chest pain. Chest pain can be a symptom of coronary artery disease (CAD), and cardiac catheterization can show whether plaque is narrowing or blocking your heart's arteries. This procedure is also used to evaluate the valves, as well as measure the blood flow and oxygen levels in different parts of your heart. For further information please visit [email or web address] Please follow instruction sheet, as given.  Please call our office on Monday to schedule   Follow-Up: At [facility], you and your health needs are our priority.  As part of our continuing mission to provide you with exceptional heart care, we have created designated Provider Care Teams.  These Care Teams include your primary Cardiologist (physician) and Advanced Practice Providers (APPs -  Physician Assistants and Nurse Practitioners) who all work together to provide you with the care you need, when you need it.  We recommend signing up for the patient portal called "MyChart".  Sign up  information is provided on this After Visit Summary.  MyChart is used to connect with patients for Virtual Visits (Telemedicine).  Patients are able to view lab/test results, encounter notes, upcoming appointments, etc.  Non-urgent messages can be sent to your provider as well.   To learn more about what you can do with MyChart, go to [email or web address].    Your next appointment:    To Be Determined   Provider:   You may see Dina Rich, MD or one of the following Advanced Practice Providers on your designated Care Team:   Randall An, PA-C  Jacolyn Reedy, [place]     Other Instructions Thank you for choosing [facility]!

## 2022-10-02 NOTE — Progress Notes (Signed)
Cardiology Office Note    Date:  10/02/2022  ID:  RILY [name], DOB [birth year], MRN [id number] Cardiologist: Dina Rich, MD    History of Present Illness:    [name] is a 72 y.o. 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    She was last examined by Ronie Spies, PA in 06/2022 and reported overall doing well within the past 6 months and had only used SL NTG on one occasion. Reported being active at baseline without anginal symptoms. She was cleared to proceed with a colonoscopy from a cardiac perspective and no changes were made to her medications at that time.   In talking with the patient today, she reports having more frequent episodes of chest pain over the past 2-3 weeks. Reports this feels like a pressure and radiates into her left arm at times. Also develops diaphoresis when this occurs. Symptoms last for 10-15 minutes and resolve with SL NTG. Last episode was 3 days ago. Says she previously rarely used SL NTG but has used several over the past 2 weeks. She denies any palpitations, orthopnea, PND or pitting edema. Does report worsening dyspnea and fatigue as well. She has been compliant with her medications.    Studies Reviewed:   EKG: EKG is ordered today and demonstrates:   EKG Interpretation Date/Time:  Friday October 02 2022 14:30:17 EDT Ventricular Rate:  74 PR Interval:  174 QRS Duration:  82 QT Interval:  414 QTC Calculation: 459 R Axis:   56  Text Interpretation: Normal sinus rhythm Normal ECG Confirmed by Randall An ([id number]) on 10/02/2022 4:06:15 PM       LHC: 12/2019 Previously placed 1st Mrg drug eluting stent is widely patent. Ost RCA to Prox RCA lesion is 90% stenosed. After  orbital atherectomy drug-eluting stent was successfully placed using a STENT RESOLUTE ONYX 3.0X26, and postdilated to 3.75 mm. Stent was optimized with IVUS. Post intervention, there is a 0% residual stenosis. The left ventricular systolic function is normal. LV end diastolic pressure is normal. The left ventricular ejection fraction is 55-65% by visual estimate. There is no aortic valve stenosis. RCA was prone to vasospasm during the PCI.   Continue dual antiplatelet therapy for at least 6 months.  Consider clopidogrel monotherapy long-term given some diffuse disease in her LAD system.  Continue with attempts at aggressive secondary prevention.   Plan for same day discharge.  NST: 08/2020 Lexiscan stress is electrically negative for ischemia Myoview scan shows mild thinning of anterior wall (mid/distal) with decreased tracer activity consistent with mild anterior ischemia; Cannot exclude however that changes are due to shifting breast attenuation. LVEF calculated at 80% This is a low risk study.  Physical Exam:   VS:  BP 122/60   Pulse 76   Ht 5\' 3"  (1.6 m)   Wt 183 lb 3.2 oz (83.1 kg)   SpO2 93%   BMI 32.45 kg/m    Wt Readings from Last 3 Encounters:  10/02/22 183 lb 3.2 oz (83.1 kg)  08/27/22 181 lb 10.5 oz (82.4 kg)  07/13/22 181 lb 9.6 oz (82.4 kg)     GEN: Well nourished, well developed female appearing in no acute distress NECK: No JVD;  No carotid bruits CARDIAC: RRR, no murmurs, rubs, gallops RESPIRATORY:  Clear to auscultation without rales, wheezing or rhonchi  ABDOMEN: Appears non-distended. No obvious abdominal masses. EXTREMITIES: No clubbing or cyanosis. No pitting edema.  Distal pedal pulses are 2+ bilaterally.   Assessment and Plan:    1. Chest Pain concerning for Accelerating Angina - Her episodes of chest pain, dyspnea and diaphoresis are very concerning for progressive angina. Reviewed options with the patient today and given her high pre-test  probability, I feel that a repeat cardiac catheterization would be the best option as compared to an NST. She is already on optimal medical therapy. She will need to arrange transportation with her Case Worker and will call us back next week in regards to what date/times would work (I also left a voicemail with her Case Worker today in regards to this). I will also review with Dr. Wyline Mood to make sure he is in agreement with a catheterization. The patient understands that risks include but are not limited to stroke (1 in 1000), death (1 in 1000), kidney failure [usually temporary] (1 in 500), bleeding (1 in 200), allergic reaction [possibly serious] (1 in 200). Will proceed with checking pre-procedure CBC and BMET today. We reviewed that if symptoms progress in the interim or she has to take more than 3 SL NTG with one episode, then she should proceed to the Emergency Department.   Addendum: 10/05/2022: Reviewed with Dr. Wyline Mood who is in agreement for catheterization.   2. CAD - She is s/p NSTEMI in 2017 with DES to OM and unsuccessful PCI to RCA with wire dissection. She did undergo DES to RCA in 2021.  - Would plan for repeat ischemic evaluation as discussed above. Continue ASA, Plavix, Atorvastatin, Amlodipine, Imdur and Losartan. Previously intolerant to beta-blocker therapy due to bradycardia.   3. HFpEF - Prior echo in 2017 showed a preserved EF of 65-70%. Can consider updating an echo at her next visit. She appears euvolemic on examination today and only takes Lasix 40mg  as needed. Remains on Jardiance 10mg  daily.   4. HTN - BP is well-controlled at 122/60 during today's visit. Continue Amlodipine, Imdur and Losartan at current dosing.   5. HLD - Followed by her PCP. She remains on Atorvastatin 80mg  daily.   6. History of CVA - Occurred in the setting of carotid artery dissection. Remains on ASA, Plavix and statin therapy.   Signed, Ellsworth Lennox, PA-C

## 2022-10-02 NOTE — Telephone Encounter (Signed)
   Pt c/o of Chest Pain: STAT if active CP, including tightness, pressure, jaw pain, radiating pain to shoulder/upper arm/back, CP unrelieved by Nitro. Symptoms reported of SOB, nausea, vomiting, sweating.  1. Are you having CP right now? N/A    2. Are you experiencing any other symptoms (ex. SOB, nausea, vomiting, sweating)? N/A   3. Is your CP continuous or coming and going? Coming and going   4. Have you taken Nitroglycerin? Yes, symptoms subside when taken   5. How long have you been experiencing CP? N/A    6. If NO CP at time of call then end call with telling Pt to call back or call 911 if Chest pain returns prior to return call from triage team.    Social worker called to schedule a sooner appt for the patient due to her symptoms. Patient is scheduled to see Randall An today at 2:30 pm. Victorino Dike was not with the patient at the time of the call for further information. Please advise.

## 2022-10-05 ENCOUNTER — Encounter: Payer: Self-pay | Admitting: *Deleted

## 2022-10-05 ENCOUNTER — Telehealth: Payer: Self-pay | Admitting: Cardiology

## 2022-10-05 NOTE — Telephone Encounter (Signed)
Patient is calling in regards to lab results. Requesting call back.

## 2022-10-05 NOTE — Telephone Encounter (Signed)
[place] Ouch, [place] M, PA-C  Oblong, Earle G, LPN Please let the patient know her hemoglobin and platelets are within a normal range. Electrolytes and kidney function stable. Continue with plans for cardiac catheterization within the next 1-2 weeks (need to verify date/time with the patient and her social worker on Monday).    Results discussed with patient,await call back from social worker,Jennifer Nolon Bussing, [phone], ext [phone]

## 2022-10-05 NOTE — Telephone Encounter (Signed)
Was returning call. Please advise  

## 2022-10-08 ENCOUNTER — Telehealth: Payer: Self-pay | Admitting: *Deleted

## 2022-10-08 NOTE — Telephone Encounter (Signed)
Cardiac Catheterization scheduled at [facility] for: Monday October 12, 2022 10:30 AM Arrival time [facility] Main Entrance A at: 8:30 AM  Nothing to eat after midnight prior to procedure, clear liquids until 5 AM day of procedure.   Medication instructions: -Hold:  Jardiance-AM of procedure  Lasix/KCl-AM of procedure  -Other usual morning medications can be taken with sips of water including aspirin 81 mg and  Plavix 75 mg.  Confirmed patient has responsible adult to drive home post procedure and be with patient first 24 hours after arriving home.  Plan to go home the same day, you will only stay overnight if medically necessary.  Reviewed procedure instructions with patient

## 2022-10-12 ENCOUNTER — Ambulatory Visit (HOSPITAL_COMMUNITY)
Admission: RE | Admit: 2022-10-12 | Discharge: 2022-10-12 | Disposition: A | Discharge status: Home / Self Care | Payer: 59 | Source: Ambulatory Visit | Attending: Interventional Cardiology | Admitting: Interventional Cardiology

## 2022-10-12 ENCOUNTER — Other Ambulatory Visit: Payer: Self-pay

## 2022-10-12 ENCOUNTER — Encounter (HOSPITAL_COMMUNITY)
Admission: RE | Disposition: A | Discharge status: Home / Self Care | Payer: Self-pay | Source: Ambulatory Visit | Attending: Interventional Cardiology

## 2022-10-12 DIAGNOSIS — Z955 Presence of coronary angioplasty implant and graft: Secondary | ICD-10-CM | POA: Insufficient documentation

## 2022-10-12 DIAGNOSIS — Z7982 Long term (current) use of aspirin: Secondary | ICD-10-CM | POA: Diagnosis not present

## 2022-10-12 DIAGNOSIS — E785 Hyperlipidemia, unspecified: Secondary | ICD-10-CM | POA: Diagnosis not present

## 2022-10-12 DIAGNOSIS — Z8673 Personal history of transient ischemic attack (TIA), and cerebral infarction without residual deficits: Secondary | ICD-10-CM | POA: Diagnosis not present

## 2022-10-12 DIAGNOSIS — I5032 Chronic diastolic (congestive) heart failure: Secondary | ICD-10-CM | POA: Insufficient documentation

## 2022-10-12 DIAGNOSIS — I25118 Atherosclerotic heart disease of native coronary artery with other forms of angina pectoris: Secondary | ICD-10-CM | POA: Diagnosis present

## 2022-10-12 DIAGNOSIS — Z7902 Long term (current) use of antithrombotics/antiplatelets: Secondary | ICD-10-CM | POA: Insufficient documentation

## 2022-10-12 DIAGNOSIS — I110 Hypertensive heart disease with heart failure: Secondary | ICD-10-CM | POA: Diagnosis not present

## 2022-10-12 DIAGNOSIS — Z79899 Other long term (current) drug therapy: Secondary | ICD-10-CM | POA: Insufficient documentation

## 2022-10-12 HISTORY — PX: LEFT HEART CATH AND CORONARY ANGIOGRAPHY: CATH118249

## 2022-10-12 SURGERY — LEFT HEART CATH AND CORONARY ANGIOGRAPHY
Anesthesia: LOCAL

## 2022-10-12 MED ORDER — LIDOCAINE HCL (PF) 1 % IJ SOLN
INTRAMUSCULAR | Status: AC
  Filled 2022-10-12: qty 30

## 2022-10-12 MED ORDER — MIDAZOLAM HCL 2 MG/2ML IJ SOLN
INTRAMUSCULAR | Status: AC
  Filled 2022-10-12: qty 2

## 2022-10-12 MED ORDER — HYDRALAZINE HCL 20 MG/ML IJ SOLN
10.0000 mg | INTRAMUSCULAR | Status: DC | PRN
  Administered 2022-10-12 (×2): 10 mg via INTRAVENOUS
  Filled 2022-10-12: qty 1

## 2022-10-12 MED ORDER — HEPARIN (PORCINE) IN NACL 1000-0.9 UT/500ML-% IV SOLN
INTRAVENOUS | Status: DC | PRN
  Administered 2022-10-12 (×2): 500 mL via INTRA_ARTERIAL

## 2022-10-12 MED ORDER — SODIUM CHLORIDE 0.9% FLUSH: 3.0000 mL | INTRAVENOUS | Status: DC | PRN

## 2022-10-12 MED ORDER — HEPARIN SODIUM (PORCINE) 1000 UNIT/ML IJ SOLN
INTRAMUSCULAR | Status: AC
  Filled 2022-10-12: qty 10

## 2022-10-12 MED ORDER — SODIUM CHLORIDE 0.9% FLUSH: 3.0000 mL | Freq: Two times a day (BID) | INTRAVENOUS | Status: DC

## 2022-10-12 MED ORDER — ACETAMINOPHEN 325 MG PO TABS
650.0000 mg | ORAL_TABLET | ORAL | Status: DC | PRN
  Administered 2022-10-12: 650 mg via ORAL
  Filled 2022-10-12: qty 2

## 2022-10-12 MED ORDER — SODIUM CHLORIDE 0.9 % IV SOLN: 250.0000 mL | INTRAVENOUS | Status: DC | PRN

## 2022-10-12 MED ORDER — FENTANYL CITRATE (PF) 100 MCG/2ML IJ SOLN
INTRAMUSCULAR | Status: DC | PRN
  Administered 2022-10-12 (×2): 25 ug via INTRAVENOUS

## 2022-10-12 MED ORDER — LABETALOL HCL 5 MG/ML IV SOLN
10.0000 mg | INTRAVENOUS | Status: DC | PRN
  Filled 2022-10-12: qty 4

## 2022-10-12 MED ORDER — SODIUM CHLORIDE 0.9 % WEIGHT BASED INFUSION: 1.0000 mL/kg/h | INTRAVENOUS | Status: DC

## 2022-10-12 MED ORDER — VERAPAMIL HCL 2.5 MG/ML IV SOLN
INTRAVENOUS | Status: AC
  Filled 2022-10-12: qty 2

## 2022-10-12 MED ORDER — HEPARIN SODIUM (PORCINE) 1000 UNIT/ML IJ SOLN
INTRAMUSCULAR | Status: DC | PRN
  Administered 2022-10-12: 4000 [IU] via INTRAVENOUS

## 2022-10-12 MED ORDER — SODIUM CHLORIDE 0.9 % WEIGHT BASED INFUSION
3.0000 mL/kg/h | INTRAVENOUS | Status: AC
  Administered 2022-10-12: 3 mL/kg/h via INTRAVENOUS

## 2022-10-12 MED ORDER — ONDANSETRON HCL 4 MG/2ML IJ SOLN: 4.0000 mg | Freq: Four times a day (QID) | INTRAMUSCULAR | Status: DC | PRN

## 2022-10-12 MED ORDER — CLOPIDOGREL BISULFATE 75 MG PO TABS
75.0000 mg | ORAL_TABLET | Freq: Once | ORAL | Status: AC
  Administered 2022-10-12: 75 mg via ORAL
  Filled 2022-10-12: qty 1

## 2022-10-12 MED ORDER — SODIUM CHLORIDE 0.9 % IV SOLN: INTRAVENOUS | Status: AC

## 2022-10-12 MED ORDER — VERAPAMIL HCL 2.5 MG/ML IV SOLN
INTRAVENOUS | Status: DC | PRN
  Administered 2022-10-12: 10 mL via INTRA_ARTERIAL
  Administered 2022-10-12: 5 mL via INTRA_ARTERIAL

## 2022-10-12 MED ORDER — MIDAZOLAM HCL 2 MG/2ML IJ SOLN
INTRAMUSCULAR | Status: DC | PRN
  Administered 2022-10-12: 1 mg via INTRAVENOUS
  Administered 2022-10-12: 2 mg via INTRAVENOUS

## 2022-10-12 MED ORDER — FENTANYL CITRATE (PF) 100 MCG/2ML IJ SOLN
INTRAMUSCULAR | Status: AC
  Filled 2022-10-12: qty 2

## 2022-10-12 MED ORDER — ASPIRIN 81 MG PO CHEW
81.0000 mg | CHEWABLE_TABLET | ORAL | Status: AC
  Administered 2022-10-12: 81 mg via ORAL
  Filled 2022-10-12: qty 1

## 2022-10-12 MED ORDER — IOHEXOL 350 MG/ML SOLN
INTRAVENOUS | Status: DC | PRN
  Administered 2022-10-12: 39 mL via INTRA_ARTERIAL

## 2022-10-12 MED ORDER — LIDOCAINE HCL (PF) 1 % IJ SOLN
INTRAMUSCULAR | Status: DC | PRN
  Administered 2022-10-12: 3 mL
  Administered 2022-10-12: 2 mL
  Administered 2022-10-12: 3 mL

## 2022-10-12 SURGICAL SUPPLY — 12 items
CATH 5FR JL3.5 JR4 ANG PIG MP (CATHETERS) IMPLANT
DEVICE RAD COMP TR BAND LRG (VASCULAR PRODUCTS) IMPLANT
GLIDESHEATH SLEND SS 6F .021 (SHEATH) IMPLANT
GUIDEWIRE INQWIRE 1.5J.035X260 (WIRE) IMPLANT
INQWIRE 1.5J .035X260CM (WIRE) ×1
KIT HEART LEFT (KITS) ×1 IMPLANT
PACK CARDIAC CATHETERIZATION (CUSTOM PROCEDURE TRAY) ×1 IMPLANT
PROTECTION STATION PRESSURIZED (MISCELLANEOUS) ×1
SET ATX-X65L (MISCELLANEOUS) IMPLANT
SHEATH PROBE COVER 6X72 (BAG) IMPLANT
STATION PROTECTION PRESSURIZED (MISCELLANEOUS) IMPLANT
TRANSDUCER W/STOPCOCK (MISCELLANEOUS) ×1 IMPLANT

## 2022-10-12 NOTE — Discharge Instructions (Signed)
DRINK PLENTY OF FLUIDS OVER THE NEXT 2-3 DAYS. 

## 2022-10-12 NOTE — Interval H&P Note (Signed)
Cath Lab Visit (complete for each Cath Lab visit)  Clinical Evaluation Leading to the Procedure:   ACS: No.  Non-ACS:    Anginal Classification: CCS II  Anti-ischemic medical therapy: Maximal Therapy (2 or more classes of medications)  Non-Invasive Test Results: No non-invasive testing performed  Prior CABG: No previous CABG  Prior atherectomy of RCA.     History and Physical Interval Note:  10/12/2022 10:35 AM  [name]  has presented today for surgery, with the diagnosis of chest pain.  The various methods of treatment have been discussed with the patient and family. After consideration of risks, benefits and other options for treatment, the patient has consented to  Procedure(s): LEFT HEART CATH AND CORONARY ANGIOGRAPHY (N/A) as a surgical intervention.  The patient's history has been reviewed, patient examined, no change in status, stable for surgery.  I have reviewed the patient's chart and labs.  Questions were answered to the patient's satisfaction.     Lance Muss

## 2022-10-13 ENCOUNTER — Encounter (HOSPITAL_COMMUNITY): Payer: Self-pay | Admitting: Interventional Cardiology

## 2022-10-16 ENCOUNTER — Other Ambulatory Visit (INDEPENDENT_AMBULATORY_CARE_PROVIDER_SITE_OTHER): Payer: Self-pay | Admitting: Gastroenterology

## 2022-10-16 DIAGNOSIS — R1319 Other dysphagia: Secondary | ICD-10-CM

## 2022-10-16 DIAGNOSIS — K219 Gastro-esophageal reflux disease without esophagitis: Secondary | ICD-10-CM

## 2022-10-19 ENCOUNTER — Encounter: Payer: Self-pay | Admitting: Internal Medicine

## 2022-10-19 ENCOUNTER — Ambulatory Visit (INDEPENDENT_AMBULATORY_CARE_PROVIDER_SITE_OTHER): Payer: 59 | Admitting: Internal Medicine

## 2022-10-19 VITALS — BP 105/65 | HR 69 | Resp 16 | Ht 63.0 in | Wt 180.0 lb

## 2022-10-19 DIAGNOSIS — K219 Gastro-esophageal reflux disease without esophagitis: Secondary | ICD-10-CM

## 2022-10-19 DIAGNOSIS — F1190 Opioid use, unspecified, uncomplicated: Secondary | ICD-10-CM

## 2022-10-19 DIAGNOSIS — G894 Chronic pain syndrome: Secondary | ICD-10-CM

## 2022-10-19 DIAGNOSIS — F411 Generalized anxiety disorder: Secondary | ICD-10-CM

## 2022-10-19 DIAGNOSIS — F331 Major depressive disorder, recurrent, moderate: Secondary | ICD-10-CM | POA: Insufficient documentation

## 2022-10-19 DIAGNOSIS — F329 Major depressive disorder, single episode, unspecified: Secondary | ICD-10-CM | POA: Insufficient documentation

## 2022-10-19 DIAGNOSIS — Z79899 Other long term (current) drug therapy: Secondary | ICD-10-CM

## 2022-10-19 DIAGNOSIS — E785 Hyperlipidemia, unspecified: Secondary | ICD-10-CM

## 2022-10-19 DIAGNOSIS — I5032 Chronic diastolic (congestive) heart failure: Secondary | ICD-10-CM

## 2022-10-19 DIAGNOSIS — F17200 Nicotine dependence, unspecified, uncomplicated: Secondary | ICD-10-CM

## 2022-10-19 DIAGNOSIS — J449 Chronic obstructive pulmonary disease, unspecified: Secondary | ICD-10-CM | POA: Diagnosis not present

## 2022-10-19 DIAGNOSIS — I10 Essential (primary) hypertension: Secondary | ICD-10-CM

## 2022-10-19 DIAGNOSIS — Z8673 Personal history of transient ischemic attack (TIA), and cerebral infarction without residual deficits: Secondary | ICD-10-CM

## 2022-10-19 DIAGNOSIS — F410 Panic disorder [episodic paroxysmal anxiety] without agoraphobia: Secondary | ICD-10-CM | POA: Insufficient documentation

## 2022-10-19 DIAGNOSIS — E039 Hypothyroidism, unspecified: Secondary | ICD-10-CM

## 2022-10-19 DIAGNOSIS — E1165 Type 2 diabetes mellitus with hyperglycemia: Secondary | ICD-10-CM

## 2022-10-19 DIAGNOSIS — Z1231 Encounter for screening mammogram for malignant neoplasm of breast: Secondary | ICD-10-CM

## 2022-10-19 DIAGNOSIS — I2510 Atherosclerotic heart disease of native coronary artery without angina pectoris: Secondary | ICD-10-CM | POA: Diagnosis not present

## 2022-10-19 DIAGNOSIS — F419 Anxiety disorder, unspecified: Secondary | ICD-10-CM

## 2022-10-19 DIAGNOSIS — F324 Major depressive disorder, single episode, in partial remission: Secondary | ICD-10-CM

## 2022-10-19 NOTE — Assessment & Plan Note (Signed)
Symptoms are adequately controlled with Pepcid and Protonix.

## 2022-10-19 NOTE — Assessment & Plan Note (Signed)
She currently smokes 0.5 packs/day of cigarettes and has been smoking for at least 40 years. -The patient was counseled on the dangers of tobacco use, and was advised to quit.  Reviewed strategies to maximize success, including removing cigarettes and smoking materials from environment, stress management, substitution of other forms of reinforcement, support of family/friends, and written materials.

## 2022-10-19 NOTE — Assessment & Plan Note (Signed)
Previously documented history of COPD.  She is asymptomatic currently.  Not on maintenance therapy but has an albuterol inhaler for as needed use.  Pulmonary exam is unremarkable.

## 2022-10-19 NOTE — Assessment & Plan Note (Signed)
Previously documented history of hypothyroidism.  Etiology unclear.  She is currently prescribed levothyroxine 50 mcg daily. -Repeat thyroid studies ordered today.

## 2022-10-19 NOTE — Assessment & Plan Note (Signed)
History of CVA in 2005 secondary to right ICA dissection.  S/p stenting.

## 2022-10-19 NOTE — Assessment & Plan Note (Signed)
She endorses diffuse, chronic pain, that is currently managed with hydrocodone-acetaminophen 10-325 mg.  She currently takes 2-3 tablets daily as needed.  She describes a previous intolerance to gabapentin and prednisone. -I expressed my concern for chronic opioid use for pain control, particularly when being used concomitantly with multiple benzodiazepines.  Pain management referral recommended.  She is in agreement with this plan.

## 2022-10-19 NOTE — Assessment & Plan Note (Signed)
Followed by GI (Dr. Levon Hedger).  Currently prescribed azathioprine and ursodiol.  GI follow-up is scheduled for next month.

## 2022-10-19 NOTE — Assessment & Plan Note (Signed)
A1c 7.3 on labs from April.  She is currently prescribed Jardiance 10 mg daily. -Repeat A1c and urine microalbumin/creatinine ratio ordered today.

## 2022-10-19 NOTE — Patient Instructions (Signed)
It was a pleasure to see you today.  Thank you for giving [name] the opportunity to be involved in your care.  Below is a brief recap of your visit and next steps.  We will plan to see you again in 3 months.  Summary You have established care today We will check basic labs Referrals to psychiatry and pain management have been placed Follow up in 3 months

## 2022-10-19 NOTE — Assessment & Plan Note (Signed)
Euvolemic on exam today.  She is currently prescribed Jardiance 10 mg daily.  Normal EF noted on recent LHC.

## 2022-10-19 NOTE — Assessment & Plan Note (Signed)
Tearful during today's encounter when discussing anxiety.  She is currently prescribed clorazepate 7.5 mg 3 times daily as well as alprazolam 1 mg nightly as needed.  She has been treated with benzodiazepines for management of anxiety for several years. -I reviewed my concern for chronic benzodiazepine use for management of anxiety with [name], particularly given concomitant chronic opioid use.  I have recommended a referral to psychiatry today.  She is in agreement with this plan.

## 2022-10-19 NOTE — Assessment & Plan Note (Signed)
Currently prescribed citalopram 10 mg daily.  Psychiatry referral placed as otherwise documented.

## 2022-10-19 NOTE — Assessment & Plan Note (Signed)
Currently prescribed atorvastatin 80 mg daily.  Repeat lipid panel ordered today.

## 2022-10-19 NOTE — Assessment & Plan Note (Signed)
History of CAD s/p PCI to OM with DES (2017) as well as RCA with DES (2021).  Recently underwent repeat LHC, which revealed mild, nonobstructive CAD.  Denies chest pain currently.  She is currently prescribed ASA/Plavix and atorvastatin.

## 2022-10-19 NOTE — Assessment & Plan Note (Signed)
BP today is 105/65.  She is currently prescribed losartan 25 mg daily, amlodipine 5 mg daily, and Imdur 120 mg daily.  No medication changes are indicated.

## 2022-10-19 NOTE — Progress Notes (Signed)
New Patient Office Visit  Subjective    Patient ID: [name], female    DOB: Feb 09, 1951  Age: 72 y.o. MRN: [id number]  CC:  Chief Complaint  Patient presents with   Establish Care    Had heart cath on 7/30    HPI [name] presents to establish care.  She is a 72 year old woman with an extensive past medical history notable for CAD, anxiety and depression, HLD, HTN, hypothyroidism, chronic pain syndrome, CVA (2005), T2DM, COPD, CHF, and autoimmune hepatitis.  Previously followed by Dr. [name].  [name] endorses chronic pain but otherwise reports feeling well.  She does not have any acute concerns to discuss today aside from desiring to establish care.  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  She endorses current tobacco use, smoking 0.5 packs/day x 40 years.  Denies alcohol and illicit drug use.  Her family medical history is significant for CAD, T2DM, and colon cancer (father).  Chronic medical conditions and outstanding preventative care items discussed today are individually addressed A/P below.  Outpatient Encounter Medications as of 10/19/2022  Medication Sig   albuterol (PROVENTIL HFA;VENTOLIN HFA) 108 (90 Base) MCG/ACT inhaler Inhale 2 puffs into the lungs every 4 (four) hours as needed for wheezing or shortness of breath.   albuterol (PROVENTIL) (2.5 MG/3ML) 0.083% nebulizer solution Take 2.5 mg by nebulization every 6 (six) hours as needed for wheezing or shortness of breath.   ALPRAZolam (XANAX) 1 MG tablet Take 0.5-1 mg by mouth at bedtime as needed for sleep.   amLODipine (NORVASC) 5 MG tablet TAKE 1 TABLET BY MOUTH ONCE DAILY.   aspirin EC 81 MG tablet Take 1 tablet (81 mg total) by mouth daily.   aspirin-acetaminophen-caffeine (EXCEDRIN MIGRAINE) 250-250-65 MG tablet Take 1 tablet by mouth daily as needed for headache.   atorvastatin (LIPITOR) 80 MG tablet TAKE ONE TABLET BY MOUTH DAILY AT 6 P.M.   cetirizine (ZYRTEC) 10 MG  tablet Take 10 mg by mouth daily.   Cholecalciferol (VITAMIN D3 SUPER STRENGTH) 50 MCG (2000 UT) CAPS Take 2,000 Units by mouth daily.   citalopram (CELEXA) 10 MG tablet Take 10 mg by mouth daily.   clopidogrel (PLAVIX) 75 MG tablet TAKE (1) TABLET BY MOUTH ONCE DAILY.   clorazepate (TRANXENE) 7.5 MG tablet Take 7.5 mg by mouth 3 (three) times daily as needed for anxiety.    famotidine (PEPCID) 20 MG tablet Take 1 tablet (20 mg total) by mouth at bedtime.   fluticasone (FLONASE) 50 MCG/ACT nasal spray Place 2 sprays into both nostrils as needed for allergies.   furosemide (LASIX) 40 MG tablet Take 40 mg by mouth daily as needed for fluid (leg/hand swelling.).   HYDROcodone-acetaminophen (NORCO) 10-325 MG tablet Take 1 tablet by mouth every 4 (four) hours as needed for moderate pain.   isosorbide mononitrate (IMDUR) 120 MG 24 hr tablet Take 1 tablet (120 mg total) by mouth daily.   JARDIANCE 10 MG TABS tablet Take 10 mg by mouth every morning.   Ketotifen Fumarate (ALLERGY EYE DROPS OP) Place 1 drop into both eyes daily as needed (allergies).   levothyroxine (SYNTHROID, LEVOTHROID) 50 MCG tablet Take 50 mcg by mouth daily before breakfast.    losartan (COZAAR) 25 MG tablet Take 1 tablet (25 mg total) by mouth daily.   nitroGLYCERIN (NITROSTAT) 0.4 MG SL tablet Place 0.4 mg under the tongue every 5 (five) minutes as needed for chest pain.     ondansetron (ZOFRAN)  4 MG tablet Take 1 tablet (4 mg total) by mouth 3 (three) times daily as needed.   pantoprazole (PROTONIX) 40 MG tablet TAKE 1 TABLET BY MOUTH ONCE A DAY.   potassium chloride SA (KLOR-CON M) 20 MEQ tablet Take 1 tablet (20 mEq total) by mouth daily as needed (Take only when you take Lasix).   tamsulosin (FLOMAX) 0.4 MG CAPS capsule Take 0.4 mg by mouth daily. For Kidney Stones   No facility-administered encounter medications on file as of 10/19/2022.    Past Medical History:  Diagnosis Date   Anxiety    Arthritis    "hands, arms,  back, neck, knees, fingers" (01/21/2016)   Bradycardia    Breast cancer, left breast (HCC) 1970s; 2015   CAD (coronary artery disease)    a. s/p NSTEMI in 2017 with DES to OM and unsuccessful PCI to RCA with wire dissection b. DES to RCA in 2021   Cancer of skin of leg    BLE   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HCC)    left   Chronic lower back pain    Chronic pain    COPD (chronic obstructive pulmonary disease) (HCC)    Coronary artery disease    Depression    GERD (gastroesophageal reflux disease)    Heart murmur    Hepatic cirrhosis (HCC) 12/20/2017   Hepatitis    History of blood transfusion    "related to spleen"   History of hiatal hernia    History of kidney stones    Hyperlipidemia    Hypertension    Hypothyroidism    Kidney stone    Melanoma of forearm, left (HCC)    Migraine    "a few migraines/year" (01/21/2016)   Obesity    Stroke (HCC) 04/2003   2 carotid artery stents in place; small left parietal and left hemispheric CVA.[name] 01/21/2016   Tobacco use disorder    50 pack years; questionably discontinued in 2010   Wears glasses     Past Surgical History:  Procedure Laterality Date   ABDOMINAL HYSTERECTOMY  1978   APPENDECTOMY     BIOPSY  04/28/2022   Procedure: BIOPSY;  Surgeon: Marguerita Merles, Reuel Boom, MD;  [facility]: [facility];  Service: Gastroenterology;;   BREAST BIOPSY Left 1970s; 2015   BREAST LUMPECTOMY Left 1970s   BREAST LUMPECTOMY WITH NEEDLE LOCALIZATION Left 07/07/2013   Procedure: BREAST LUMPECTOMY WITH NEEDLE LOCALIZATION;  Surgeon: Robyne Askew, MD;  Location: [facility];  Service: General;  Laterality: Left;   CARDIAC CATHETERIZATION N/A 01/22/2016   Procedure: Left Heart Cath and Coronary Angiography;  Surgeon: Peter M Swaziland, MD;  Location: [facility] INVASIVE CV LAB;  Service: Cardiovascular;  Laterality: N/A;   CARDIAC CATHETERIZATION N/A 01/22/2016   Procedure: Coronary Stent Intervention;  Surgeon: Peter M Swaziland, MD;  Location:  [facility] INVASIVE CV LAB;  Service: Cardiovascular;  Laterality: N/A;   CAROTID STENT Left 04/2003   small left parietal and left hemispheric CVA/notes 07/29/2010   CHOLECYSTECTOMY OPEN     COLONOSCOPY  2011   COLONOSCOPY N/A 05/10/2019   Procedure: COLONOSCOPY;  Surgeon: Malissa Hippo, MD;  Location: [facility];  Service: Endoscopy;  Laterality: N/A;  1:45   COLONOSCOPY WITH PROPOFOL N/A 09/01/2022   Procedure: COLONOSCOPY WITH PROPOFOL;  Surgeon: Dolores Frame, MD;  Location: [facility];  Service: Gastroenterology;  Laterality: N/A;  1:45PM;ASA 3   CORONARY ATHERECTOMY N/A 12/29/2019   Procedure: CORONARY ATHERECTOMY;  Surgeon: Corky Crafts,  MD;  Location: [facility] INVASIVE CV LAB;  Service: Cardiovascular;  Laterality: N/A;  Prox RCA   CORONARY STENT INTERVENTION N/A 12/29/2019   Procedure: CORONARY STENT INTERVENTION;  Surgeon: Corky Crafts, MD;  Location: [facility] INVASIVE CV LAB;  Service: Cardiovascular;  Laterality: N/A;  Prox RCA   CORONARY ULTRASOUND/IVUS N/A 12/29/2019   Procedure: Intravascular Ultrasound/IVUS;  Surgeon: Corky Crafts, MD;  Location: [facility] INVASIVE CV LAB;  Service: Cardiovascular;  Laterality: N/A;   ESOPHAGEAL DILATION N/A 04/28/2022   Procedure: ESOPHAGEAL DILATION;  Surgeon: Dolores Frame, MD;  [facility]: [facility];  Service: Gastroenterology;  Laterality: N/A;   ESOPHAGOGASTRODUODENOSCOPY  08/06/2011   Procedure: ESOPHAGOGASTRODUODENOSCOPY (EGD);  Surgeon: Malissa Hippo, MD;  [facility];  Service: Endoscopy;  Laterality: N/A;   ESOPHAGOGASTRODUODENOSCOPY N/A 05/10/2019   Procedure: ESOPHAGOGASTRODUODENOSCOPY (EGD);  Surgeon: Malissa Hippo, MD;  [facility];  Service: Endoscopy;  Laterality: N/A;   ESOPHAGOGASTRODUODENOSCOPY (EGD) WITH PROPOFOL N/A 04/28/2022   Procedure: ESOPHAGOGASTRODUODENOSCOPY (EGD) WITH PROPOFOL;  Surgeon: Dolores Frame, MD;  [facility];  Service:  Gastroenterology;  Laterality: N/A;  10:30 am, pt can't move up due to transportation   FLEXIBLE SIGMOIDOSCOPY  08/06/2011   Procedure: FLEXIBLE SIGMOIDOSCOPY;  Surgeon: Malissa Hippo, MD;  [facility];  Service: Endoscopy;  Laterality: N/A;   HEMOSTASIS CLIP PLACEMENT  09/01/2022   Procedure: HEMOSTASIS CLIP PLACEMENT;  Surgeon: Dolores Frame, MD;  [facility];  Service: Gastroenterology;;   INGUINAL HERNIA REPAIR Left 1970s   IR ANGIO INTRA EXTRACRAN SEL COM CAROTID INNOMINATE BILAT MOD SED  11/01/2020   IR ANGIO VERTEBRAL SEL VERTEBRAL BILAT MOD SED  11/01/2020   IR RADIOLOGIST EVAL & MGMT  09/23/2020   IR US GUIDE VASC ACCESS RIGHT  11/01/2020   LARYNX SURGERY  1970s   Polyps excised   LEFT HEART CATH AND CORONARY ANGIOGRAPHY N/A 12/29/2019   Procedure: LEFT HEART CATH AND CORONARY ANGIOGRAPHY;  Surgeon: Corky Crafts, MD;  Location: [facility];  Service: Cardiovascular;  Laterality: N/A;   LEFT HEART CATH AND CORONARY ANGIOGRAPHY N/A 10/12/2022   Procedure: LEFT HEART CATH AND CORONARY ANGIOGRAPHY;  Surgeon: Corky Crafts, MD;  Location: [facility] INVASIVE CV LAB;  Service: Cardiovascular;  Laterality: N/A;   MALONEY DILATION  05/10/2019   Procedure: [name] DILATION;  Surgeon: Malissa Hippo, MD;  [facility]: [facility];  Service: Endoscopy;;   MELANOMA EXCISION Left ~ 2016   forearm   POLYPECTOMY  05/10/2019   Procedure: POLYPECTOMY;  Surgeon: Malissa Hippo, MD;  [facility]: [facility];  Service: Endoscopy;;  colon   POLYPECTOMY  09/01/2022   Procedure: POLYPECTOMY INTESTINAL;  Surgeon: Dolores Frame, MD;  [facility]: [facility];  Service: Gastroenterology;;   SPLENECTOMY, 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   spontaneous rupture   SUBMUCOSAL LIFTING INJECTION  09/01/2022   Procedure: SUBMUCOSAL LIFTING INJECTION;  Surgeon: Dolores Frame, MD;  [facility];  Service: Gastroenterology;;   SUBMUCOSAL TATTOO INJECTION   09/01/2022   Procedure: SUBMUCOSAL TATTOO INJECTION;  Surgeon: Dolores Frame, MD;  [facility]: [facility];  Service: Gastroenterology;;   TEMPORARY PACEMAKER N/A 12/29/2019   Procedure: TEMPORARY PACEMAKER;  Surgeon: Corky Crafts, MD;  Location: [facility] INVASIVE CV LAB;  Service: Cardiovascular;  Laterality: N/A;    Family History  Problem Relation Age of Onset   Heart failure Mother    Cancer Father    Heart failure  Father    Cancer Sister    Dementia Sister    Heart disease Other    Arthritis Other    Cancer Other    Diabetes Other    Kidney disease Other    Cancer Sister    Heart failure Brother     Social History   Socioeconomic History   Marital status: Divorced    Spouse name: Not on file   Number of children: 2   Years of education: Not on file   Highest education level: Not on file  Occupational History   Occupation: Disabled   Occupation: retired  Tobacco Use   Smoking status: Every Day    Current packs/day: 0.50    Average packs/day: 0.5 packs/day for 40.0 years (20.0 ttl pk-yrs)    Types: Cigarettes    Passive exposure: Current   Smokeless tobacco: Never  Vaping Use   Vaping status: Never Used  Substance and Sexual Activity   Alcohol use: No    Alcohol/week: 0.0 standard drinks of alcohol   Drug use: No   Sexual activity: Not Currently  Other Topics Concern   Not on file  Social History Narrative   ** Merged History Encounter **       Social Determinants of Health   Financial Resource Strain: Not on file  Food Insecurity: Not on file  Transportation Needs: Not on file  Physical Activity: Not on file  Stress: Not on file  Social Connections: Not on file  Intimate Partner Violence: Not on file   Review of Systems  Constitutional:  Negative for chills and fever.  HENT:  Negative for sore throat.   Respiratory:  Negative for cough and shortness of breath.   Cardiovascular:  Negative for chest pain, palpitations and leg swelling.   Gastrointestinal:  Negative for abdominal pain, blood in stool, constipation, diarrhea, nausea and vomiting.  Genitourinary:  Negative for dysuria and hematuria.  Musculoskeletal:  Positive for back pain, joint pain and neck pain. Negative for myalgias.  Skin:  Negative for itching and rash.  Neurological:  Negative for dizziness and headaches.  Psychiatric/Behavioral:  Negative for depression and suicidal ideas.    Objective    BP 105/65 (BP Location: Left Arm, Patient Position: Sitting)   Pulse 69   Resp 16   Ht 5\' 3"  (1.6 m)   Wt 180 lb (81.6 kg)   SpO2 90%   BMI 31.89 kg/m   Physical Exam Vitals reviewed.  Constitutional:      General: She is not in acute distress.    Appearance: Normal appearance. She is obese. She is not toxic-appearing.  HENT:     Head: Normocephalic and atraumatic.     Right Ear: External ear normal.     Left Ear: External ear normal.     Nose: Nose normal. No congestion or rhinorrhea.     Mouth/Throat:     Mouth: Mucous membranes are moist.     Pharynx: Oropharynx is clear. No oropharyngeal exudate or posterior oropharyngeal erythema.  Eyes:     General: No scleral icterus.    Extraocular Movements: Extraocular movements intact.     Conjunctiva/sclera: Conjunctivae normal.     Pupils: Pupils are equal, round, and reactive to light.  Cardiovascular:     Rate and Rhythm: Normal rate and regular rhythm.     Pulses: Normal pulses.     Heart sounds: Murmur heard.     No friction rub. No gallop.  Pulmonary:     Effort:  Pulmonary effort is normal.     Breath sounds: Normal breath sounds. No wheezing, rhonchi or rales.  Abdominal:     General: Abdomen is flat. Bowel sounds are normal. There is no distension.     Palpations: Abdomen is soft.     Tenderness: There is no abdominal tenderness.  Musculoskeletal:        General: No swelling. Normal range of motion.     Cervical back: Normal range of motion.     Right lower leg: No edema.     Left  lower leg: No edema.  Lymphadenopathy:     Cervical: No cervical adenopathy.  Skin:    General: Skin is warm and dry.     Capillary Refill: Capillary refill takes less than 2 seconds.     Coloration: Skin is not jaundiced.  Neurological:     General: No focal deficit present.     Mental Status: She is alert and oriented to person, place, and time.  Psychiatric:        Mood and Affect: Mood normal.        Behavior: Behavior normal.    Assessment & Plan:   Problem List Items Addressed This Visit       Essential hypertension - Primary (Chronic)    BP today is 105/65.  She is currently prescribed losartan 25 mg daily, amlodipine 5 mg daily, and Imdur 120 mg daily.  No medication changes are indicated.      Chronic diastolic CHF (congestive heart failure) (HCC)    Euvolemic on exam today.  She is currently prescribed Jardiance 10 mg daily.  Normal EF noted on recent LHC.      CAD S/P percutaneous coronary angioplasty    History of CAD s/p PCI to OM with DES (2017) as well as RCA with DES (2021).  Recently underwent repeat LHC, which revealed mild, nonobstructive CAD.  Denies chest pain currently.  She is currently prescribed ASA/Plavix and atorvastatin.      COPD (chronic obstructive pulmonary disease) (HCC)    Previously documented history of COPD.  She is asymptomatic currently.  Not on maintenance therapy but has an albuterol inhaler for as needed use.  Pulmonary exam is unremarkable.      GERD (gastroesophageal reflux disease) (Chronic)    Symptoms are adequately controlled with Pepcid and Protonix.      Hypothyroidism (Chronic)    Previously documented history of hypothyroidism.  Etiology unclear.  She is currently prescribed levothyroxine 50 mcg daily. -Repeat thyroid studies ordered today.      Type 2 diabetes mellitus with hyperglycemia, without long-term current use of insulin (HCC)    A1c 7.3 on labs from April.  She is currently prescribed Jardiance 10 mg  daily. -Repeat A1c and urine microalbumin/creatinine ratio ordered today.      Chronic pain (Chronic)    She endorses diffuse, chronic pain, that is currently managed with hydrocodone-acetaminophen 10-325 mg.  She currently takes 2-3 tablets daily as needed.  She describes a previous intolerance to gabapentin and prednisone. -I expressed my concern for chronic opioid use for pain control, particularly when being used concomitantly with multiple benzodiazepines.  Pain management referral recommended.  She is in agreement with this plan.      Dyslipidemia, goal LDL below 70    Currently prescribed atorvastatin 80 mg daily.  Repeat lipid panel ordered today.      Tobacco use disorder    She currently smokes 0.5 packs/day of cigarettes and has been smoking  for at least 40 years. -The patient was counseled on the dangers of tobacco use, and was advised to quit.  Reviewed strategies to maximize success, including removing cigarettes and smoking materials from environment, stress management, substitution of other forms of reinforcement, support of family/friends, and written materials.       History of CVA (cerebrovascular accident)    History of CVA in 2005 secondary to right ICA dissection.  S/p stenting.      Generalized anxiety disorder    Tearful during today's encounter when discussing anxiety.  She is currently prescribed clorazepate 7.5 mg 3 times daily as well as alprazolam 1 mg nightly as needed.  She has been treated with benzodiazepines for management of anxiety for several years. -I reviewed my concern for chronic benzodiazepine use for management of anxiety with Ms. Latner, particularly given concomitant chronic opioid use.  I have recommended a referral to psychiatry today.  She is in agreement with this plan.      MDD (major depressive disorder)    Currently prescribed citalopram 10 mg daily.  Psychiatry referral placed as otherwise documented.      Return in about 3 months  (around 01/19/2023).   Billie Lade, MD

## 2022-10-20 ENCOUNTER — Telehealth: Payer: Self-pay | Admitting: Internal Medicine

## 2022-10-20 NOTE — Telephone Encounter (Signed)
Patient call about her blood work results. Asked for a return call.

## 2022-10-20 NOTE — Telephone Encounter (Signed)
lvm

## 2022-10-21 ENCOUNTER — Telehealth: Payer: Self-pay | Admitting: Internal Medicine

## 2022-10-21 NOTE — Telephone Encounter (Signed)
Spoke with patient.

## 2022-10-21 NOTE — Telephone Encounter (Signed)
Pt called in returning call for Labs. Wants a call back before 1:00pm patient has to be at work by that time.

## 2022-10-21 NOTE — Telephone Encounter (Signed)
Spoke with patient in regards to lab result from 10/19/2022.

## 2022-10-21 NOTE — Telephone Encounter (Signed)
Patient called for lab results. Asking for a call back.

## 2022-10-26 ENCOUNTER — Encounter: Payer: Self-pay | Admitting: Physical Medicine & Rehabilitation

## 2022-10-26 ENCOUNTER — Ambulatory Visit (HOSPITAL_COMMUNITY)
Admission: RE | Admit: 2022-10-26 | Discharge: 2022-10-26 | Disposition: A | Discharge status: Home / Self Care | Payer: 59 | Source: Ambulatory Visit | Attending: Internal Medicine | Admitting: Internal Medicine

## 2022-10-26 DIAGNOSIS — Z1231 Encounter for screening mammogram for malignant neoplasm of breast: Secondary | ICD-10-CM | POA: Insufficient documentation

## 2022-11-04 ENCOUNTER — Ambulatory Visit: Payer: 59 | Admitting: Cardiology

## 2022-11-05 ENCOUNTER — Ambulatory Visit (INDEPENDENT_AMBULATORY_CARE_PROVIDER_SITE_OTHER): Payer: 59 | Admitting: Family Medicine

## 2022-11-05 ENCOUNTER — Encounter: Payer: Self-pay | Admitting: Family Medicine

## 2022-11-05 VITALS — BP 138/76 | HR 96 | Ht 63.0 in | Wt 177.1 lb

## 2022-11-05 DIAGNOSIS — G500 Trigeminal neuralgia: Secondary | ICD-10-CM | POA: Diagnosis not present

## 2022-11-05 MED ORDER — CARBAMAZEPINE ER 100 MG PO CP12
100.0000 mg | ORAL_CAPSULE | Freq: Two times a day (BID) | ORAL | 0 refills | Status: DC
Start: 2022-11-05 — End: 2022-12-07

## 2022-11-05 NOTE — Assessment & Plan Note (Addendum)
Unilateral Patient reports intense shooting , jabbing pain feeling like an electric shock from the left side Jaw line that radiates to the left ear, Sudden pain is triggered by touching face or chewing,Pain lasting a several minutes. Trial on carbamazepine 100 mg two times daily Referral placed to neurology Advise patient to visit ED for worsening symptoms

## 2022-11-05 NOTE — Patient Instructions (Signed)
        Great to see you today.   - Please take medications as prescribed. - Follow up with your primary health provider if any health concerns arises. - If symptoms worsen please contact your primary care provider and/or visit the emergency department.  

## 2022-11-05 NOTE — Progress Notes (Signed)
Patient Office Visit   Subjective   Patient ID: [name], female    DOB: [birth year]  Age: 72 y.o. MRN: [id number]  CC:  Chief Complaint  Patient presents with   Jaw Pain    Left side is popping and is causing shooting pain up into the ear. Feels like a tooth ache. The noise is loud.    Immunizations    Pt request Flu vaccine and Tdap. Also is in need of Foot , eye and annual wellness along with lung cancer screening.     HPI [name] 72 year old female, presents to the clinic for worsening facial pain starting from her jaw radiating to her ear started a few days ago now worsening. She  has a past medical history of Anxiety, Arthritis, Bradycardia, Breast cancer, left breast (HCC) (1970s; 2015), CAD (coronary artery disease), Cancer of skin of leg, 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HCC), Chronic lower back pain, Chronic pain, COPD (chronic obstructive pulmonary disease) (HCC), Coronary artery disease, Depression, GERD (gastroesophageal reflux disease), Heart murmur, Hepatic cirrhosis (HCC) (12/20/2017), Hepatitis, History of blood transfusion, History of hiatal hernia, History of kidney stones, Hyperlipidemia, Hypertension, Hypothyroidism, Kidney stone, Melanoma of forearm, left (HCC), Migraine, Obesity, Stroke (HCC) (04/2003), Tobacco use disorder, and Wears glasses.  HPI    Outpatient Encounter Medications as of 11/05/2022  Medication Sig   albuterol (PROVENTIL HFA;VENTOLIN HFA) 108 (90 Base) MCG/ACT inhaler Inhale 2 puffs into the lungs every 4 (four) hours as needed for wheezing or shortness of breath.   albuterol (PROVENTIL) (2.5 MG/3ML) 0.083% nebulizer solution Take 2.5 mg by nebulization every 6 (six) hours as needed for wheezing or shortness of breath.   ALPRAZolam (XANAX) 1 MG tablet Take 0.5-1 mg by mouth at bedtime as needed for sleep.   amLODipine (NORVASC) 5 MG tablet TAKE 1 TABLET BY MOUTH ONCE DAILY.   aspirin EC 81 MG tablet Take 1 tablet (81 mg total) by  mouth daily.   atorvastatin (LIPITOR) 80 MG tablet TAKE ONE TABLET BY MOUTH DAILY AT 6 P.M.   carbamazepine (CARBATROL) 100 MG 12 hr capsule Take 1 capsule (100 mg total) by mouth 2 (two) times daily.   cetirizine (ZYRTEC) 10 MG tablet Take 10 mg by mouth daily.   Cholecalciferol (VITAMIN D3 SUPER STRENGTH) 50 MCG (2000 UT) CAPS Take 2,000 Units by mouth daily.   citalopram (CELEXA) 10 MG tablet Take 10 mg by mouth daily.   clopidogrel (PLAVIX) 75 MG tablet TAKE (1) TABLET BY MOUTH ONCE DAILY.   clorazepate (TRANXENE) 7.5 MG tablet Take 7.5 mg by mouth 3 (three) times daily as needed for anxiety.    famotidine (PEPCID) 20 MG tablet Take 1 tablet (20 mg total) by mouth at bedtime.   fluticasone (FLONASE) 50 MCG/ACT nasal spray Place 2 sprays into both nostrils as needed for allergies.   furosemide (LASIX) 40 MG tablet Take 40 mg by mouth daily as needed for fluid (leg/hand swelling.).   HYDROcodone-acetaminophen (NORCO) 10-325 MG tablet Take 1 tablet by mouth every 4 (four) hours as needed for moderate pain.   isosorbide mononitrate (IMDUR) 120 MG 24 hr tablet Take 1 tablet (120 mg total) by mouth daily.   JARDIANCE 10 MG TABS tablet Take 10 mg by mouth every morning.   Ketotifen Fumarate (ALLERGY EYE DROPS OP) Place 1 drop into both eyes daily as needed (allergies).   levothyroxine (SYNTHROID, LEVOTHROID) 50 MCG tablet Take 50 mcg by mouth daily before breakfast.    losartan (COZAAR)  25 MG tablet Take 1 tablet (25 mg total) by mouth daily.   nitroGLYCERIN (NITROSTAT) 0.4 MG SL tablet Place 0.4 mg under the tongue every 5 (five) minutes as needed for chest pain.     ondansetron (ZOFRAN) 4 MG tablet Take 1 tablet (4 mg total) by mouth 3 (three) times daily as needed.   pantoprazole (PROTONIX) 40 MG tablet TAKE 1 TABLET BY MOUTH ONCE A DAY.   potassium chloride SA (KLOR-CON M) 20 MEQ tablet Take 1 tablet (20 mEq total) by mouth daily as needed (Take only when you take Lasix).   tamsulosin (FLOMAX)  0.4 MG CAPS capsule Take 0.4 mg by mouth daily. For Kidney Stones   aspirin-acetaminophen-caffeine (EXCEDRIN MIGRAINE) 250-250-65 MG tablet Take 1 tablet by mouth daily as needed for headache. (Patient not taking: Reported on 11/05/2022)   No facility-administered encounter medications on file as of 11/05/2022.    Past Surgical History:  Procedure Laterality Date   ABDOMINAL HYSTERECTOMY  1978   APPENDECTOMY     BIOPSY  04/28/2022   Procedure: BIOPSY;  Surgeon: Marguerita Merles, Reuel Boom, MD;  [facility];  Service: Gastroenterology;;   BREAST BIOPSY Left 1970s; 2015   BREAST LUMPECTOMY Left 1970s   BREAST LUMPECTOMY WITH NEEDLE LOCALIZATION Left 07/07/2013   Procedure: BREAST LUMPECTOMY WITH NEEDLE LOCALIZATION;  Surgeon: Robyne Askew, MD;  Location: [facility];  Service: General;  Laterality: Left;   CARDIAC CATHETERIZATION N/A 01/22/2016   Procedure: Left Heart Cath and Coronary Angiography;  Surgeon: Peter M Swaziland, MD;  Location: [facility] INVASIVE CV LAB;  Service: Cardiovascular;  Laterality: N/A;   CARDIAC CATHETERIZATION N/A 01/22/2016   Procedure: Coronary Stent Intervention;  Surgeon: Peter M Swaziland, MD;  Location: [facility] INVASIVE CV LAB;  Service: Cardiovascular;  Laterality: N/A;   CAROTID STENT Left 04/2003   small left parietal and left hemispheric CVA/notes 07/29/2010   CHOLECYSTECTOMY OPEN     COLONOSCOPY  2011   COLONOSCOPY N/A 05/10/2019   Procedure: COLONOSCOPY;  Surgeon: Malissa Hippo, MD;  [facility]: [facility];  Service: Endoscopy;  Laterality: N/A;  1:45   COLONOSCOPY WITH PROPOFOL N/A 09/01/2022   Procedure: COLONOSCOPY WITH PROPOFOL;  Surgeon: Dolores Frame, MD;  [facility]: [facility];  Service: Gastroenterology;  Laterality: N/A;  1:45PM;ASA 3   CORONARY ATHERECTOMY N/A 12/29/2019   Procedure: CORONARY ATHERECTOMY;  Surgeon: Corky Crafts, MD;  Location: [facility] INVASIVE CV LAB;  Service: Cardiovascular;  Laterality: N/A;  Prox RCA    CORONARY STENT INTERVENTION N/A 12/29/2019   Procedure: CORONARY STENT INTERVENTION;  Surgeon: Corky Crafts, MD;  Location: [facility] INVASIVE CV LAB;  Service: Cardiovascular;  Laterality: N/A;  Prox RCA   CORONARY ULTRASOUND/IVUS N/A 12/29/2019   Procedure: Intravascular Ultrasound/IVUS;  Surgeon: Corky Crafts, MD;  Location: [facility] INVASIVE CV LAB;  Service: Cardiovascular;  Laterality: N/A;   ESOPHAGEAL DILATION N/A 04/28/2022   Procedure: ESOPHAGEAL DILATION;  Surgeon: Dolores Frame, MD;  [facility];  Service: Gastroenterology;  Laterality: N/A;   ESOPHAGOGASTRODUODENOSCOPY  08/06/2011   Procedure: ESOPHAGOGASTRODUODENOSCOPY (EGD);  Surgeon: Malissa Hippo, MD;  [facility];  Service: Endoscopy;  Laterality: N/A;   ESOPHAGOGASTRODUODENOSCOPY N/A 05/10/2019   Procedure: ESOPHAGOGASTRODUODENOSCOPY (EGD);  Surgeon: Malissa Hippo, MD;  [facility];  Service: Endoscopy;  Laterality: N/A;   ESOPHAGOGASTRODUODENOSCOPY (EGD) WITH PROPOFOL N/A 04/28/2022   Procedure: ESOPHAGOGASTRODUODENOSCOPY (EGD) WITH PROPOFOL;  Surgeon: Dolores Frame, MD;  [facility];  Service: Gastroenterology;  Laterality: N/A;  10:30 am, pt can't move up due to transportation   FLEXIBLE SIGMOIDOSCOPY  08/06/2011   Procedure: FLEXIBLE SIGMOIDOSCOPY;  Surgeon: Malissa Hippo, MD;  [facility];  Service: Endoscopy;  Laterality: N/A;   HEMOSTASIS CLIP PLACEMENT  09/01/2022   Procedure: HEMOSTASIS CLIP PLACEMENT;  Surgeon: Dolores Frame, MD;  [facility];  Service: Gastroenterology;;   INGUINAL HERNIA REPAIR Left 1970s   IR ANGIO INTRA EXTRACRAN SEL COM CAROTID INNOMINATE BILAT MOD SED  11/01/2020   IR ANGIO VERTEBRAL SEL VERTEBRAL BILAT MOD SED  11/01/2020   IR RADIOLOGIST EVAL & MGMT  09/23/2020   IR US GUIDE VASC ACCESS RIGHT  11/01/2020   LARYNX SURGERY  1970s   Polyps excised   LEFT HEART CATH AND CORONARY  ANGIOGRAPHY N/A 12/29/2019   Procedure: LEFT HEART CATH AND CORONARY ANGIOGRAPHY;  Surgeon: Corky Crafts, MD;  Location: [facility] INVASIVE CV LAB;  Service: Cardiovascular;  Laterality: N/A;   LEFT HEART CATH AND CORONARY ANGIOGRAPHY N/A 10/12/2022   Procedure: LEFT HEART CATH AND CORONARY ANGIOGRAPHY;  Surgeon: Corky Crafts, MD;  Location: [facility] INVASIVE CV LAB;  Service: Cardiovascular;  Laterality: N/A;   MALONEY DILATION  05/10/2019   Procedure: [name] DILATION;  Surgeon: Malissa Hippo, MD;  [facility];  Service: Endoscopy;;   MELANOMA EXCISION Left ~ 2016   forearm   POLYPECTOMY  05/10/2019   Procedure: POLYPECTOMY;  Surgeon: Malissa Hippo, MD;  [facility]: [facility];  Service: Endoscopy;;  colon   POLYPECTOMY  09/01/2022   Procedure: POLYPECTOMY INTESTINAL;  Surgeon: Dolores Frame, MD;  [facility];  Service: Gastroenterology;;   SPLENECTOMY, 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   spontaneous rupture   SUBMUCOSAL LIFTING INJECTION  09/01/2022   Procedure: SUBMUCOSAL LIFTING INJECTION;  Surgeon: Dolores Frame, MD;  [facility];  Service: Gastroenterology;;   SUBMUCOSAL TATTOO INJECTION  09/01/2022   Procedure: SUBMUCOSAL TATTOO INJECTION;  Surgeon: Dolores Frame, MD;  [facility];  Service: Gastroenterology;;   TEMPORARY PACEMAKER N/A 12/29/2019   Procedure: TEMPORARY PACEMAKER;  Surgeon: Corky Crafts, MD;  Location: [facility] INVASIVE CV LAB;  Service: Cardiovascular;  Laterality: N/A;    Review of Systems  Constitutional:  Negative for chills and fever.  Eyes:  Negative for blurred vision and double vision.  Respiratory:  Negative for shortness of breath.   Cardiovascular:  Negative for chest pain.  Neurological:  Negative for dizziness and headaches.      Objective    BP 138/76 (BP Location: Left Arm, Patient Position: Sitting, Cuff Size: Normal)   Pulse 96   Ht 5\' 3"  (1.6 m)   Wt 177 lb 1.3 oz (80.3  kg)   SpO2 (!) 82%   BMI 31.37 kg/m   Physical Exam Vitals reviewed.  Constitutional:      General: She is not in acute distress.    Appearance: Normal appearance. She is not ill-appearing, toxic-appearing or diaphoretic.  HENT:     Head: Normocephalic.     Jaw: Tenderness and pain on movement present. No trismus or swelling.     Right Ear: Tympanic membrane normal.     Left Ear: Tympanic membrane normal.  Eyes:     General:        Right eye: No discharge.        Left eye: No discharge.     Conjunctiva/sclera: Conjunctivae normal.  Cardiovascular:  Rate and Rhythm: Normal rate.     Pulses: Normal pulses.     Heart sounds: Normal heart sounds.  Pulmonary:     Effort: Pulmonary effort is normal. No respiratory distress.     Breath sounds: Normal breath sounds.  Musculoskeletal:        General: Normal range of motion.     Cervical back: Normal range of motion.  Skin:    General: Skin is warm and dry.  Neurological:     General: No focal deficit present.     Mental Status: She is alert and oriented to person, place, and time.     Coordination: Coordination normal.     Gait: Gait normal.  Psychiatric:        Mood and Affect: Mood normal.       Assessment & Plan:  Trigeminal neuralgia of left side of face Assessment & Plan: Unilateral Patient reports intense shooting , jabbing pain feeling like an electric shock from the left side Jaw line that radiates to the left ear, Sudden pain is triggered by touching face or chewing,Pain lasting a several minutes. Trial on carbamazepine 100 mg two times daily Referral placed to neurology Advise patient to visit ED for worsening symptoms   Orders: -     Ambulatory referral to Neurology -     carBAMazepine ER; Take 1 capsule (100 mg total) by mouth 2 (two) times daily.  Dispense: 30 capsule; Refill: 0    Return in about 8 weeks (around 12/31/2022), or Follow up wit Dr. Durwin Nora in 8 weeks.   Cruzita Lederer Newman Nip, FNP

## 2022-11-12 ENCOUNTER — Other Ambulatory Visit: Payer: Self-pay | Admitting: Cardiology

## 2022-11-19 ENCOUNTER — Telehealth (HOSPITAL_COMMUNITY): Payer: Self-pay

## 2022-11-19 ENCOUNTER — Encounter: Payer: Self-pay | Admitting: Internal Medicine

## 2022-11-19 ENCOUNTER — Telehealth: Payer: Self-pay | Admitting: Internal Medicine

## 2022-11-19 ENCOUNTER — Other Ambulatory Visit (HOSPITAL_COMMUNITY): Payer: Self-pay | Admitting: Interventional Radiology

## 2022-11-19 DIAGNOSIS — G459 Transient cerebral ischemic attack, unspecified: Secondary | ICD-10-CM

## 2022-11-19 NOTE — Telephone Encounter (Signed)
Called to schedule cta, no answer, no vm. AB

## 2022-11-23 ENCOUNTER — Other Ambulatory Visit: Payer: Self-pay

## 2022-11-23 ENCOUNTER — Emergency Department (HOSPITAL_COMMUNITY): Payer: 59

## 2022-11-23 ENCOUNTER — Emergency Department (HOSPITAL_COMMUNITY)
Admission: EM | Admit: 2022-11-23 | Discharge: 2022-11-23 | Disposition: A | Discharge status: Home / Self Care | Payer: 59 | Attending: Emergency Medicine | Admitting: Emergency Medicine

## 2022-11-23 DIAGNOSIS — R200 Anesthesia of skin: Secondary | ICD-10-CM | POA: Diagnosis not present

## 2022-11-23 DIAGNOSIS — M26609 Unspecified temporomandibular joint disorder, unspecified side: Secondary | ICD-10-CM

## 2022-11-23 DIAGNOSIS — M792 Neuralgia and neuritis, unspecified: Secondary | ICD-10-CM | POA: Diagnosis present

## 2022-11-23 DIAGNOSIS — R531 Weakness: Secondary | ICD-10-CM | POA: Diagnosis not present

## 2022-11-23 LAB — CBC WITH DIFFERENTIAL/PLATELET
Abs Immature Granulocytes: 0.01 10*3/uL (ref 0.00–0.07)
Basophils Absolute: 0.1 10*3/uL (ref 0.0–0.1)
Basophils Relative: 1 %
Eosinophils Absolute: 0.2 10*3/uL (ref 0.0–0.5)
Eosinophils Relative: 3 %
HCT: 42.6 % (ref 36.0–46.0)
Hemoglobin: 14.1 g/dL (ref 12.0–15.0)
Immature Granulocytes: 0 %
Lymphocytes Relative: 29 %
Lymphs Abs: 1.9 10*3/uL (ref 0.7–4.0)
MCH: 29.9 pg (ref 26.0–34.0)
MCHC: 33.1 g/dL (ref 30.0–36.0)
MCV: 90.3 fL (ref 80.0–100.0)
Monocytes Absolute: 0.8 10*3/uL (ref 0.1–1.0)
Monocytes Relative: 13 %
Neutro Abs: 3.4 10*3/uL (ref 1.7–7.7)
Neutrophils Relative %: 54 %
Platelets: 355 10*3/uL (ref 150–400)
RBC: 4.72 MIL/uL (ref 3.87–5.11)
RDW: 13.6 % (ref 11.5–15.5)
WBC: 6.3 10*3/uL (ref 4.0–10.5)
nRBC: 0 % (ref 0.0–0.2)

## 2022-11-23 LAB — COMPREHENSIVE METABOLIC PANEL
ALT: 21 U/L (ref 0–44)
AST: 27 U/L (ref 15–41)
Albumin: 3.5 g/dL (ref 3.5–5.0)
Alkaline Phosphatase: 70 U/L (ref 38–126)
Anion gap: 6 (ref 5–15)
BUN: 11 mg/dL (ref 8–23)
CO2: 29 mmol/L (ref 22–32)
Calcium: 8.6 mg/dL — ABNORMAL LOW (ref 8.9–10.3)
Chloride: 100 mmol/L (ref 98–111)
Creatinine, Ser: 0.68 mg/dL (ref 0.44–1.00)
GFR, Estimated: >60 mL/min (ref >60)
Glucose, Bld: 148 mg/dL — ABNORMAL HIGH (ref 70–99)
Potassium: 3.5 mmol/L (ref 3.5–5.1)
Sodium: 135 mmol/L (ref 135–145)
Total Bilirubin: 0.3 mg/dL (ref 0.3–1.2)
Total Protein: 6.7 g/dL (ref 6.5–8.1)

## 2022-11-23 LAB — TROPONIN I (HIGH SENSITIVITY): Troponin I (High Sensitivity): 4 ng/L (ref <18)

## 2022-11-23 LAB — TSH: TSH: 1.641 u[IU]/mL (ref 0.350–4.500)

## 2022-11-23 MED ORDER — ACETAMINOPHEN 500 MG PO TABS
1000.0000 mg | ORAL_TABLET | Freq: Once | ORAL | Status: AC
  Administered 2022-11-23: 1000 mg via ORAL
  Filled 2022-11-23: qty 2

## 2022-11-23 NOTE — ED Provider Notes (Signed)
[place] EMERGENCY DEPARTMENT AT [facility] Provider Note   CSN: [id number] Arrival date & time: 11/23/22  1437     History  Chief Complaint  Patient presents with   nerve pain    Nerve Pain    [name] is a 72 y.o. female.  This is a 72 year old female who is here today multiple chief complaints.  She says that she has been having clicking in her jaw which has been ongoing for several months.  She has also had numbness on the left side of her face for a couple of weeks.  She has had intermittent pain in her left arm, left leg.  She has also been feeling weak.  She has follow-up with her primary care doctor for this.  She came in today primarily because her blood pressure was high, she felt overall weak.  Patient says that her primary care physician had sent her for a referral for "nerve pain "in her face, but she has not yet made that appointment.  Patient had a heart cath in July that she reports was normal.  I am unable to find the cath report here.  Patient has a prior history of CVA.        Home Medications Prior to Admission medications   Medication Sig Start Date End Date Taking? Authorizing Provider  albuterol (PROVENTIL HFA;VENTOLIN HFA) 108 (90 Base) MCG/ACT inhaler Inhale 2 puffs into the lungs every 4 (four) hours as needed for wheezing or shortness of breath.    [provider]  albuterol (PROVENTIL) (2.5 MG/3ML) 0.083% nebulizer solution Take 2.5 mg by nebulization every 6 (six) hours as needed for wheezing or shortness of breath.    [provider]  ALPRAZolam Prudy Feeler) 1 MG tablet Take 0.5-1 mg by mouth at bedtime as needed for sleep. 12/04/21   [provider]  amLODipine (NORVASC) 5 MG tablet TAKE 1 TABLET BY MOUTH ONCE DAILY. 09/07/22   Antoine Poche, MD  aspirin EC 81 MG tablet Take 1 tablet (81 mg total) by mouth daily. 05/11/19   Malissa Hippo, MD  aspirin-acetaminophen-caffeine (EXCEDRIN MIGRAINE)  [phone] MG tablet Take 1 tablet by mouth daily as needed for headache. Patient not taking: Reported on 11/05/2022    [provider]  atorvastatin (LIPITOR) 80 MG tablet TAKE ONE TABLET BY MOUTH DAILY AT 6 P.M. 11/12/22   Antoine Poche, MD  carbamazepine (CARBATROL) 100 MG 12 hr capsule Take 1 capsule (100 mg total) by mouth 2 (two) times daily. 11/05/22   Del Nigel Berthold, FNP  cetirizine (ZYRTEC) 10 MG tablet Take 10 mg by mouth daily.    [provider]  Cholecalciferol (VITAMIN D3 SUPER STRENGTH) 50 MCG (2000 UT) CAPS Take 2,000 Units by mouth daily.    [provider]  citalopram (CELEXA) 10 MG tablet Take 10 mg by mouth daily. 08/21/22   [provider]  clopidogrel (PLAVIX) 75 MG tablet TAKE (1) TABLET BY MOUTH ONCE DAILY. 12/11/21   Antoine Poche, MD  clorazepate (TRANXENE) 7.5 MG tablet Take 7.5 mg by mouth 3 (three) times daily as needed for anxiety.  01/03/16   [provider]  famotidine (PEPCID) 20 MG tablet Take 1 tablet (20 mg total) by mouth at bedtime. 10/09/19   Ardelia Mems, PA-C  fluticasone (FLONASE) 50 MCG/ACT nasal spray Place 2 sprays into both nostrils as needed for allergies. 03/23/22   [provider]  furosemide (LASIX) 40 MG tablet Take  40 mg by mouth daily as needed for fluid (leg/hand swelling.).    [provider]  HYDROcodone-acetaminophen (NORCO) 10-325 MG tablet Take 1 tablet by mouth every 4 (four) hours as needed for moderate pain.    [provider]  isosorbide mononitrate (IMDUR) 120 MG 24 hr tablet Take 1 tablet (120 mg total) by mouth daily. 10/02/22   Strader, Lennart Pall, PA-C  JARDIANCE 10 MG TABS tablet Take 10 mg by mouth every morning. 05/13/21   [provider]  Ketotifen Fumarate (ALLERGY EYE DROPS OP) Place 1 drop into both eyes daily as needed (allergies).    [provider]  levothyroxine (SYNTHROID, LEVOTHROID) 50 MCG tablet Take 50 mcg by mouth  daily before breakfast.  07/20/14   [provider]  losartan (COZAAR) 25 MG tablet Take 1 tablet (25 mg total) by mouth daily. 12/12/21   Antoine Poche, MD  nitroGLYCERIN (NITROSTAT) 0.4 MG SL tablet Place 0.4 mg under the tongue every 5 (five) minutes as needed for chest pain.      [provider]  ondansetron (ZOFRAN) 4 MG tablet Take 1 tablet (4 mg total) by mouth 3 (three) times daily as needed. 10/09/19   Tawni Pummel B, PA-C  pantoprazole (PROTONIX) 40 MG tablet TAKE 1 TABLET BY MOUTH ONCE A DAY. 10/16/22   Carlan, Chelsea L, NP  potassium chloride SA (KLOR-CON M) 20 MEQ tablet Take 1 tablet (20 mEq total) by mouth daily as needed (Take only when you take Lasix). 07/14/22   Dunn, Tacey Ruiz, PA-C  tamsulosin (FLOMAX) 0.4 MG CAPS capsule Take 0.4 mg by mouth daily. For Kidney Stones 09/21/22   [provider]      Allergies    Gabapentin, Prednisone, Carvedilol, Clonidine derivatives, Diltiazem, Tape, and Latex    Review of Systems   Review of Systems  Physical Exam Updated Vital Signs BP 125/70 (BP Location: Right Arm)   Pulse 64   Temp 98.2 F (36.8 C) (Oral)   Resp 17   Ht 5\' 3"  (1.6 m)   Wt 78.5 kg   SpO2 94%   BMI 30.65 kg/m  Physical Exam Vitals reviewed.  Constitutional:      Appearance: Normal appearance.  HENT:     Head: Normocephalic and atraumatic.  Eyes:     Pupils: Pupils are equal, round, and reactive to light.  Neck:     Vascular: No carotid bruit.  Cardiovascular:     Rate and Rhythm: Normal rate and regular rhythm.     Heart sounds: No murmur heard. Pulmonary:     Effort: Pulmonary effort is normal.     Breath sounds: Normal breath sounds. No wheezing.     Comments: Diminished air entry bilaterally Abdominal:     General: There is no distension.  Musculoskeletal:        General: No swelling.     Cervical back: Normal range of motion.  Neurological:     General: No focal deficit present.     Mental Status: She is alert  and oriented to person, place, and time.     Cranial Nerves: No cranial nerve deficit.     Sensory: No sensory deficit.     Motor: No weakness.     ED Results / Procedures / Treatments   Labs (all labs ordered are listed, but only abnormal results are displayed) Labs Reviewed - No data to display  EKG None  Radiology No results found.  Procedures Procedures    Medications  Ordered in ED Medications - No data to display  ED Course/ Medical Decision Making/ A&P                                 Medical Decision Making 72 year old female here today for multiple complaints.  Plan-majority of the patient's symptoms appear to be chronic in nature.  The reported weakness, pain, and numbness that she has in her left leg, left arm and the left side of her face are less consistent with CVA, and if they were, her outside of window of meaningful intervention.  Will obtain imaging of the patient's head.  She does not have any visual disturbances, have lower suspicion for giant cell arteritis.  No temporal pain.  On exam, patient does have audible clicking with opening her mouth which is of concern to her.  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  Patient with a reported clean heart cath 1 month ago.  Lower suspicion for ACS.  Will obtain an EKG.  Patient's O2 sat hovering around 91-92%.  She does smoke half a pack per day.  She has been on this for many years.  I believe this is her baseline.  Patient does not become dyspneic with conversation.  Extremities are warm, well-perfused.  I have low suspicion for dissection, or claudication.  Discussed with the patient how many of her symptoms seemed chronic in nature, however we will try to rule out life or limb threatening problems here.  Reassessment-independent review the patient's head CT shows no intracranial hemorrhage.  My dependent review the patient's chest x-ray shows no pneumonia.  Troponin negative.  Reassuring workup.  Will discharge  home.  Amount and/or Complexity of Data Reviewed Labs: ordered. Radiology: ordered.  Risk OTC drugs.           Final Clinical Impression(s) / ED Diagnoses Final diagnoses:  None    Rx / DC Orders ED Discharge Orders     None         Arletha Pili, DO 11/23/22 1813

## 2022-11-23 NOTE — Discharge Instructions (Addendum)
While you were in the emergency department, you had a CT scan done of your head that was normal.  Your EKG and blood work today were normal.  You can try taking Tylenol and ibuprofen at home for this pain, as well as the carbamazepine that you have been prescribed.  Please follow-up with your primary care doctor.

## 2022-11-23 NOTE — ED Triage Notes (Signed)
[facility] called out for CC of nerve pain effecting Left side arm and leg. 2L O2 given via Nasal Canula  by EMS for SOB.  Patient stated she had a heart attack that started as arm pain and has hx of stroke and has 2 stents placed in head and heart. Dx with nerve damage to Left side of face a month ago.

## 2022-11-23 NOTE — ED Notes (Signed)
Pt ambulated to the bathroom and back without assistance. Pt on 90 RA coming back from restroom. Pt is back on monitor and O2.

## 2022-11-27 ENCOUNTER — Encounter: Payer: Self-pay | Admitting: Cardiology

## 2022-11-27 ENCOUNTER — Ambulatory Visit: Payer: 59 | Attending: Cardiology | Admitting: Cardiology

## 2022-11-27 VITALS — BP 134/68 | HR 66 | Ht 63.0 in | Wt 179.0 lb

## 2022-11-27 DIAGNOSIS — I5032 Chronic diastolic (congestive) heart failure: Secondary | ICD-10-CM

## 2022-11-27 DIAGNOSIS — I10 Essential (primary) hypertension: Secondary | ICD-10-CM | POA: Diagnosis not present

## 2022-11-27 DIAGNOSIS — E782 Mixed hyperlipidemia: Secondary | ICD-10-CM | POA: Diagnosis not present

## 2022-11-27 DIAGNOSIS — I25118 Atherosclerotic heart disease of native coronary artery with other forms of angina pectoris: Secondary | ICD-10-CM | POA: Diagnosis not present

## 2022-11-27 MED ORDER — ROSUVASTATIN CALCIUM 40 MG PO TABS: 40.0000 mg | ORAL_TABLET | Freq: Every day | ORAL | 3 refills | Status: DC

## 2022-11-27 MED ORDER — LOSARTAN POTASSIUM 25 MG PO TABS: 25.0000 mg | ORAL_TABLET | Freq: Every day | ORAL | 3 refills | Status: DC

## 2022-11-27 MED ORDER — AMLODIPINE BESYLATE 5 MG PO TABS: 5.0000 mg | ORAL_TABLET | Freq: Every day | ORAL | 0 refills | Status: DC

## 2022-11-27 MED ORDER — FUROSEMIDE 40 MG PO TABS: 40.0000 mg | ORAL_TABLET | Freq: Every day | ORAL | 3 refills | Status: DC | PRN

## 2022-11-27 MED ORDER — CLOPIDOGREL BISULFATE 75 MG PO TABS: 75.0000 mg | ORAL_TABLET | Freq: Every day | ORAL | 3 refills | Status: DC

## 2022-11-27 MED ORDER — ISOSORBIDE MONONITRATE ER 60 MG PO TB24: ORAL_TABLET | ORAL | 3 refills | Status: DC

## 2022-11-27 NOTE — Progress Notes (Signed)
Clinical Summary Ms. Cougill is a 72 y.o.female seen today for follow up of the following medical problems.    1. CAD - admit 01/2016 with NSTEMI. Full cath report below. Received DES to OM. Unsuccesful PCI to RCA with wire induced dissection, managed medically. The OM was thought to be the culprit. If recurring symptoms can consider repeat RCA intervention. .      12/2019 cath DES to RCA. RCA was prone to vasospasm during procedure   08/2020 nuclear stress: breast attenuatin vs mild anterior ischemia, low risk    10/02/22 seen in clinic with accelerating chest pains - 09/2022 cath: LAD 20%, ramus 50%< D1 50%, patent RCA and OM stent, normal LVEDP - intermittent chest tightness, left arm ache. Resolves 5-10 minutes after NG.     2. COPD - 04/2018 admission with COPD exacerbation       3. Chronic diastolic HF - no recent edema - no SOB/DOE.     4. HTN - had some bilateral LE edema 12/2019, norvasc was lowered to 5mg  daily.  -compliant with meds   4. Bradycardia - admit 08/2016 with bradycardia - resolved off coreg   5. History of CVA - 2005 at [facility] - she reports history of stents at the time . Followed by Dr Corliss Skains, 10/2020 monitoring cerebral angiogram.      6. Hyperlipedemia 10/2022 TC 132 TG 104 HDL 42 LDL 71   7. 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 Son passed Jul 23 2021 Past Medical History:  Diagnosis Date   Anxiety    Arthritis    "hands, arms, back, neck, knees, fingers" (01/21/2016)   Bradycardia    Breast cancer, left breast (HCC) 1970s; 2015   CAD (coronary artery disease)    a. s/p NSTEMI in 2017 with DES to OM and unsuccessful PCI to RCA with wire dissection b. DES to RCA in 2021   Cancer of skin of leg    BLE   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HCC)    left   Chronic lower back pain    Chronic pain    COPD  (chronic obstructive pulmonary disease) (HCC)    Coronary artery disease    Depression    GERD (gastroesophageal reflux disease)    Heart murmur    Hepatic cirrhosis (HCC) 12/20/2017   Hepatitis    History of blood transfusion    "related to spleen"   History of hiatal hernia    History of kidney stones    Hyperlipidemia    Hypertension    Hypothyroidism    Kidney stone    Melanoma of forearm, left (HCC)    Migraine    "a few migraines/year" (01/21/2016)   Obesity    Stroke (HCC) 04/2003   2 carotid artery stents in place; small left parietal and left hemispheric CVA.[name] 01/21/2016   Tobacco use disorder    50 pack years; questionably discontinued in 2010   Wears glasses      Allergies  Allergen Reactions   Gabapentin Nausea And Vomiting   Prednisone Palpitations   Carvedilol     Stopped due to bradycardia   Clonidine Derivatives     Stopped due to bradycardia    Diltiazem     Stopped due to bradycardia    Tape Other (See Comments)    Causes blisters  to form   Latex Rash and Other (See Comments)    Blisters     Current Outpatient Medications  Medication Sig Dispense Refill   albuterol (PROVENTIL HFA;VENTOLIN HFA) 108 (90 Base) MCG/ACT inhaler Inhale 2 puffs into the lungs every 4 (four) hours as needed for wheezing or shortness of breath.     albuterol (PROVENTIL) (2.5 MG/3ML) 0.083% nebulizer solution Take 2.5 mg by nebulization every 6 (six) hours as needed for wheezing or shortness of breath.     ALPRAZolam (XANAX) 1 MG tablet Take 0.5-1 mg by mouth at bedtime as needed for sleep.     amLODipine (NORVASC) 5 MG tablet TAKE 1 TABLET BY MOUTH ONCE DAILY. 90 tablet 0   aspirin EC 81 MG tablet Take 1 tablet (81 mg total) by mouth daily.     aspirin-acetaminophen-caffeine (EXCEDRIN MIGRAINE) 250-250-65 MG tablet Take 1 tablet by mouth daily as needed for headache. (Patient not taking: Reported on 11/05/2022)     atorvastatin (LIPITOR) 80 MG tablet TAKE ONE TABLET BY  MOUTH DAILY AT 6 P.M. 90 tablet 3   carbamazepine (CARBATROL) 100 MG 12 hr capsule Take 1 capsule (100 mg total) by mouth 2 (two) times daily. 30 capsule 0   cetirizine (ZYRTEC) 10 MG tablet Take 10 mg by mouth daily.     Cholecalciferol (VITAMIN D3 SUPER STRENGTH) 50 MCG (2000 UT) CAPS Take 2,000 Units by mouth daily.     citalopram (CELEXA) 10 MG tablet Take 10 mg by mouth daily.     clopidogrel (PLAVIX) 75 MG tablet TAKE (1) TABLET BY MOUTH ONCE DAILY. 90 tablet 1   clorazepate (TRANXENE) 7.5 MG tablet Take 7.5 mg by mouth 3 (three) times daily as needed for anxiety.      famotidine (PEPCID) 20 MG tablet Take 1 tablet (20 mg total) by mouth at bedtime. 30 tablet 11   fluticasone (FLONASE) 50 MCG/ACT nasal spray Place 2 sprays into both nostrils as needed for allergies.     furosemide (LASIX) 40 MG tablet Take 40 mg by mouth daily as needed for fluid (leg/hand swelling.).     HYDROcodone-acetaminophen (NORCO) 10-325 MG tablet Take 1 tablet by mouth every 4 (four) hours as needed for moderate pain.     isosorbide mononitrate (IMDUR) 120 MG 24 hr tablet Take 1 tablet (120 mg total) by mouth daily. 90 tablet 0   JARDIANCE 10 MG TABS tablet Take 10 mg by mouth every morning.     Ketotifen Fumarate (ALLERGY EYE DROPS OP) Place 1 drop into both eyes daily as needed (allergies).     levothyroxine (SYNTHROID, LEVOTHROID) 50 MCG tablet Take 50 mcg by mouth daily before breakfast.      losartan (COZAAR) 25 MG tablet Take 1 tablet (25 mg total) by mouth daily. 30 tablet 6   nitroGLYCERIN (NITROSTAT) 0.4 MG SL tablet Place 0.4 mg under the tongue every 5 (five) minutes as needed for chest pain.       ondansetron (ZOFRAN) 4 MG tablet Take 1 tablet (4 mg total) by mouth 3 (three) times daily as needed. 30 tablet 1   pantoprazole (PROTONIX) 40 MG tablet TAKE 1 TABLET BY MOUTH ONCE A DAY. 90 tablet 0   potassium chloride SA (KLOR-CON M) 20 MEQ tablet Take 1 tablet (20 mEq total) by mouth daily as needed (Take  only when you take Lasix). 30 tablet 11   tamsulosin (FLOMAX) 0.4 MG CAPS capsule Take 0.4 mg by mouth daily. For Kidney Stones  No current facility-administered medications for this visit.     Past Surgical History:  Procedure Laterality Date   ABDOMINAL HYSTERECTOMY  1978   APPENDECTOMY     BIOPSY  04/28/2022   Procedure: BIOPSY;  Surgeon: Marguerita Merles, Reuel Boom, MD;  [facility];  Service: Gastroenterology;;   BREAST BIOPSY Left 1970s; 2015   BREAST LUMPECTOMY Left 1970s   BREAST LUMPECTOMY WITH NEEDLE LOCALIZATION Left 07/07/2013   Procedure: BREAST LUMPECTOMY WITH NEEDLE LOCALIZATION;  Surgeon: Robyne Askew, MD;  Location: [facility];  Service: General;  Laterality: Left;   CARDIAC CATHETERIZATION N/A 01/22/2016   Procedure: Left Heart Cath and Coronary Angiography;  Surgeon: Peter M Swaziland, MD;  Location: [facility] INVASIVE CV LAB;  Service: Cardiovascular;  Laterality: N/A;   CARDIAC CATHETERIZATION N/A 01/22/2016   Procedure: Coronary Stent Intervention;  Surgeon: Peter M Swaziland, MD;  Location: [facility] INVASIVE CV LAB;  Service: Cardiovascular;  Laterality: N/A;   CAROTID STENT Left 04/2003   small left parietal and left hemispheric CVA/notes 07/29/2010   CHOLECYSTECTOMY OPEN     COLONOSCOPY  2011   COLONOSCOPY N/A 05/10/2019   Procedure: COLONOSCOPY;  Surgeon: Malissa Hippo, MD;  [facility]: [facility];  Service: Endoscopy;  Laterality: N/A;  1:45   COLONOSCOPY WITH PROPOFOL N/A 09/01/2022   Procedure: COLONOSCOPY WITH PROPOFOL;  Surgeon: Dolores Frame, MD;  [facility]: [facility];  Service: Gastroenterology;  Laterality: N/A;  1:45PM;ASA 3   CORONARY ATHERECTOMY N/A 12/29/2019   Procedure: CORONARY ATHERECTOMY;  Surgeon: Corky Crafts, MD;  Location: [facility] INVASIVE CV LAB;  Service: Cardiovascular;  Laterality: N/A;  Prox RCA   CORONARY STENT INTERVENTION N/A 12/29/2019   Procedure: CORONARY STENT INTERVENTION;  Surgeon: Corky Crafts, MD;  Location: [facility] INVASIVE CV LAB;  Service: Cardiovascular;  Laterality: N/A;  Prox RCA   CORONARY ULTRASOUND/IVUS N/A 12/29/2019   Procedure: Intravascular Ultrasound/IVUS;  Surgeon: Corky Crafts, MD;  Location: [facility] INVASIVE CV LAB;  Service: Cardiovascular;  Laterality: N/A;   ESOPHAGEAL DILATION N/A 04/28/2022   Procedure: ESOPHAGEAL DILATION;  Surgeon: Dolores Frame, MD;  [facility];  Service: Gastroenterology;  Laterality: N/A;   ESOPHAGOGASTRODUODENOSCOPY  08/06/2011   Procedure: ESOPHAGOGASTRODUODENOSCOPY (EGD);  Surgeon: Malissa Hippo, MD;  [facility];  Service: Endoscopy;  Laterality: N/A;   ESOPHAGOGASTRODUODENOSCOPY N/A 05/10/2019   Procedure: ESOPHAGOGASTRODUODENOSCOPY (EGD);  Surgeon: Malissa Hippo, MD;  [facility];  Service: Endoscopy;  Laterality: N/A;   ESOPHAGOGASTRODUODENOSCOPY (EGD) WITH PROPOFOL N/A 04/28/2022   Procedure: ESOPHAGOGASTRODUODENOSCOPY (EGD) WITH PROPOFOL;  Surgeon: Dolores Frame, MD;  [facility];  Service: Gastroenterology;  Laterality: N/A;  10:30 am, pt can't move up due to transportation   FLEXIBLE SIGMOIDOSCOPY  08/06/2011   Procedure: FLEXIBLE SIGMOIDOSCOPY;  Surgeon: Malissa Hippo, MD;  [facility];  Service: Endoscopy;  Laterality: N/A;   HEMOSTASIS CLIP PLACEMENT  09/01/2022   Procedure: HEMOSTASIS CLIP PLACEMENT;  Surgeon: Dolores Frame, MD;  [facility];  Service: Gastroenterology;;   INGUINAL HERNIA REPAIR Left 1970s   IR ANGIO INTRA EXTRACRAN SEL COM CAROTID INNOMINATE BILAT MOD SED  11/01/2020   IR ANGIO VERTEBRAL SEL VERTEBRAL BILAT MOD SED  11/01/2020   IR RADIOLOGIST EVAL & MGMT  09/23/2020   IR US GUIDE VASC ACCESS RIGHT  11/01/2020   LARYNX SURGERY  1970s   Polyps excised   LEFT HEART CATH AND CORONARY ANGIOGRAPHY N/A 12/29/2019  Procedure: LEFT HEART CATH AND CORONARY ANGIOGRAPHY;  Surgeon: Corky Crafts,  MD;  Location: [facility] INVASIVE CV LAB;  Service: Cardiovascular;  Laterality: N/A;   LEFT HEART CATH AND CORONARY ANGIOGRAPHY N/A 10/12/2022   Procedure: LEFT HEART CATH AND CORONARY ANGIOGRAPHY;  Surgeon: Corky Crafts, MD;  Location: [facility] INVASIVE CV LAB;  Service: Cardiovascular;  Laterality: N/A;   MALONEY DILATION  05/10/2019   Procedure: [name] DILATION;  Surgeon: Malissa Hippo, MD;  [facility];  Service: Endoscopy;;   MELANOMA EXCISION Left ~ 2016   forearm   POLYPECTOMY  05/10/2019   Procedure: POLYPECTOMY;  Surgeon: Malissa Hippo, MD;  [facility];  Service: Endoscopy;;  colon   POLYPECTOMY  09/01/2022   Procedure: POLYPECTOMY INTESTINAL;  Surgeon: Dolores Frame, MD;  [facility];  Service: Gastroenterology;;   SPLENECTOMY, 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   spontaneous rupture   SUBMUCOSAL LIFTING INJECTION  09/01/2022   Procedure: SUBMUCOSAL LIFTING INJECTION;  Surgeon: Dolores Frame, MD;  [facility];  Service: Gastroenterology;;   SUBMUCOSAL TATTOO INJECTION  09/01/2022   Procedure: SUBMUCOSAL TATTOO INJECTION;  Surgeon: Dolores Frame, MD;  [facility];  Service: Gastroenterology;;   TEMPORARY PACEMAKER N/A 12/29/2019   Procedure: TEMPORARY PACEMAKER;  Surgeon: Corky Crafts, MD;  Location: [facility] INVASIVE CV LAB;  Service: Cardiovascular;  Laterality: N/A;     Allergies  Allergen Reactions   Gabapentin Nausea And Vomiting   Prednisone Palpitations   Carvedilol     Stopped due to bradycardia   Clonidine Derivatives     Stopped due to bradycardia    Diltiazem     Stopped due to bradycardia    Tape Other (See Comments)    Causes blisters to form   Latex Rash and Other (See Comments)    Blisters      Family History  Problem Relation Age of Onset   Heart failure Mother    Cancer Father    Heart failure Father    Cancer Sister    Dementia Sister    Heart disease Other     Arthritis Other    Cancer Other    Diabetes Other    Kidney disease Other    Cancer Sister    Heart failure Brother      Social History Ms. Ose reports that she has been smoking cigarettes. She has a 20 pack-year smoking history. She has been exposed to tobacco smoke. She has never used smokeless tobacco. Ms. Zeitlin reports no history of alcohol use.   Review of Systems CONSTITUTIONAL: No weight loss, fever, chills, weakness or fatigue.  HEENT: Eyes: No visual loss, blurred vision, double vision or yellow sclerae.No hearing loss, sneezing, congestion, runny nose or sore throat.  SKIN: No rash or itching.  CARDIOVASCULAR: per hpi RESPIRATORY: No shortness of breath, cough or sputum.  GASTROINTESTINAL: No anorexia, nausea, vomiting or diarrhea. No abdominal pain or blood.  GENITOURINARY: No burning on urination, no polyuria MUSCULOSKELETAL: No muscle, back pain, joint pain or stiffness.  LYMPHATICS: No enlarged nodes. No history of splenectomy.  PSYCHIATRIC: No history of depression or anxiety.  ENDOCRINOLOGIC: No reports of sweating, cold or heat intolerance. No polyuria or polydipsia.  Marland Kitchen   Physical Examination Today's Vitals   11/27/22 0910  BP: 134/68  Pulse: 66  SpO2: 93%  Weight: 179 lb (81.2 kg)  Height: 5\' 3"  (1.6 m)   Body mass index is 31.71 kg/m.  Gen: resting  comfortably, no acute distress HEENT: no scleral icterus, pupils equal round and reactive, no palptable cervical adenopathy,  CV: RRR, no mrg, no jvd Resp: Clear to auscultation bilaterally GI: abdomen is soft, non-tender, non-distended, normal bowel sounds, no hepatosplenomegaly MSK: extremities are warm, no edema.  Skin: warm, no rash Neuro:  no focal deficits Psych: appropriate affect   Diagnostic Studies  01/2016 cath The left ventricular systolic function is normal. LV end diastolic pressure is normal. The left ventricular ejection fraction is 55-65% by visual estimate. Prox RCA  lesion, 90 %stenosed. 1st Diag lesion, 60 %stenosed. Prox LAD to Mid LAD lesion, 20 %stenosed. 2nd Diag lesion, 40 %stenosed. Ost Ramus to Ramus lesion, 50 %stenosed. 1st Mrg lesion, 99 %stenosed. A STENT SYNERGY DES 2.75X20 drug eluting stent was successfully placed. Post intervention, there is a 0% residual stenosis. Ost RCA to Prox RCA lesion, 90 %stenosed. Post intervention, there is a 90% residual stenosis with dissection from the proximal to mid RCA.   1. Severe 2 vessel obstructive CAD 2. Normal LV function and LV EDP 3. Successful stenting of the first OM with DES. This appears to be the culprit vessel. 4. Unsuccessful PCI of the RCA due to wire induced dissection of the vessel.    Plan: DAPT for one year. Aggressive BP control. Will continue IV Ntg overnight. Despite RCA dissection patient is pain free, hemodynamically stable and has a normal Ecg. If her symptoms remain stable I would treat medically and allow the RCA to heal. If she continues to have angina attempt at PCI of the RCA could be considered once the vessel has had time to heal - 6-8 weeks.      01/2016 echo Study Conclusions   - Left ventricle: The cavity size was normal. Systolic function was   vigorous. The estimated ejection fraction was in the range of 65%   to 70%. Wall motion was normal; there were no regional wall   motion abnormalities. There was an increased relative   contribution of atrial contraction to ventricular filling.   Doppler parameters are consistent with abnormal left ventricular   relaxation (grade 1 diastolic dysfunction). Doppler parameters   are consistent with high ventricular filling pressure. - Tricuspid valve: There was trivial regurgitation. - Pulmonary arteries: PA peak pressure: 32 mm Hg (S). - Pericardium, extracardiac: A trivial, free-flowing pericardial   effusion was identified along the right ventricular free wall.   The fluid had no internal echoes.     12/2019  cath Previously placed 1st Mrg drug eluting stent is widely patent. Ost RCA to Prox RCA lesion is 90% stenosed. After orbital atherectomy drug-eluting stent was successfully placed using a STENT RESOLUTE ONYX 3.0X26, and postdilated to 3.75 mm. Stent was optimized with IVUS. Post intervention, there is a 0% residual stenosis. The left ventricular systolic function is normal. LV end diastolic pressure is normal. The left ventricular ejection fraction is 55-65% by visual estimate. There is no aortic valve stenosis. RCA was prone to vasospasm during the PCI.   Continue dual antiplatelet therapy for at least 6 months.  Consider clopidogrel monotherapy long-term given some diffuse disease in her LAD system.  Continue with attempts at aggressive secondary prevention.   Plan for same day discharge.     08/2020 nuclear stress Lexiscan stress is electrically negative for ischemia Myoview scan shows mild thinning of anterior wall (mid/distal) with decreased tracer activity consistent with mild anterior ischemia; Cannot exclude however that changes are due to shifting breast attenuation. LVEF calculated  at 80% This is a low risk study.         Assessment and Plan   1. CAD with stable angina - recent cath as reported above, no significant disease - intermittent atypical symptoms though resolves with NG and reports similar to her prior angina. Perhaps vasospasm or microvasc disease, had some vasospasm on prior cath - increase imdur to 180mg  daily and monitor symptoms - on DAPT for her cerebrovascular stents, not for cardiac indication     2.HTN - LE edema on higher norvasc dosing.  - at goal, continue current meds   4. Chronic diastolic HF - no symptoms, she is euvolemic today. COntinue current meds   5. Hyperlipidemia - LDL goal is <55, she is not at goal. D/c atorvastatin,start crestor 40mg  daily  F/u 6 months     Antoine Poche, M.D.

## 2022-11-27 NOTE — Patient Instructions (Signed)
Medication Instructions:  Your physician has recommended you make the following change in your medication:   -Stop Atorvastatin -Start Crestor 40 mg tablet once daily at bedtime -Increase Imdur to 180 mg once daily.   *If you need a refill on your cardiac medications before your next appointment, please call your pharmacy*   Lab Work: None If you have labs (blood work) drawn today and your tests are completely normal, you will receive your results only by: MyChart Message (if you have MyChart) OR A paper copy in the mail If you have any lab test that is abnormal or we need to change your treatment, we will call you to review the results.   Testing/Procedures: None   Follow-Up: At [facility], you and your health needs are our priority.  As part of our continuing mission to provide you with exceptional heart care, we have created designated Provider Care Teams.  These Care Teams include your primary Cardiologist (physician) and Advanced Practice Providers (APPs -  Physician Assistants and Nurse Practitioners) who all work together to provide you with the care you need, when you need it.  We recommend signing up for the patient portal called "MyChart".  Sign up information is provided on this After Visit Summary.  MyChart is used to connect with patients for Virtual Visits (Telemedicine).  Patients are able to view lab/test results, encounter notes, upcoming appointments, etc.  Non-urgent messages can be sent to your provider as well.   To learn more about what you can do with MyChart, go to [email or web address].    Your next appointment:   4 month(s)  Provider:   You may see Dina Rich, MD or one of the following Advanced Practice Providers on your designated Care Team:   Randall An, PA-C  Jacolyn Reedy, [place]     Other Instructions

## 2022-11-30 NOTE — Telephone Encounter (Signed)
Encounter made in error.

## 2022-12-02 ENCOUNTER — Other Ambulatory Visit (HOSPITAL_COMMUNITY): Payer: Self-pay | Admitting: Family Medicine

## 2022-12-02 DIAGNOSIS — R3129 Other microscopic hematuria: Secondary | ICD-10-CM

## 2022-12-03 ENCOUNTER — Encounter: Payer: 59 | Admitting: Physical Medicine & Rehabilitation

## 2022-12-07 ENCOUNTER — Ambulatory Visit (INDEPENDENT_AMBULATORY_CARE_PROVIDER_SITE_OTHER): Payer: 59 | Admitting: Gastroenterology

## 2022-12-07 ENCOUNTER — Encounter (INDEPENDENT_AMBULATORY_CARE_PROVIDER_SITE_OTHER): Payer: Self-pay | Admitting: Gastroenterology

## 2022-12-07 VITALS — BP 112/67 | HR 73 | Temp 98.1°F | Ht 63.0 in | Wt 177.2 lb

## 2022-12-07 DIAGNOSIS — K743 Primary biliary cirrhosis: Secondary | ICD-10-CM | POA: Diagnosis not present

## 2022-12-07 DIAGNOSIS — R112 Nausea with vomiting, unspecified: Secondary | ICD-10-CM | POA: Diagnosis not present

## 2022-12-07 DIAGNOSIS — R1319 Other dysphagia: Secondary | ICD-10-CM | POA: Diagnosis not present

## 2022-12-07 DIAGNOSIS — K754 Autoimmune hepatitis: Secondary | ICD-10-CM

## 2022-12-07 MED ORDER — URSODIOL 500 MG PO TABS
500.0000 mg | ORAL_TABLET | Freq: Two times a day (BID) | ORAL | 3 refills | Status: DC
Start: 2022-12-07 — End: 2023-04-29

## 2022-12-07 MED ORDER — AZATHIOPRINE 50 MG PO TABS
50.0000 mg | ORAL_TABLET | Freq: Every day | ORAL | 2 refills | Status: DC
Start: 2022-12-07 — End: 2023-04-29

## 2022-12-07 NOTE — Patient Instructions (Signed)
Continue azathioprine 50 mg qday Continue ursodiol 500 mg twice a day Continue implementing swallowing recommendations - take small sips of water and briefly holding the liquid in her mouth to allow adequate swallowing Continue Zofran as needed for nausea/vomiting.

## 2022-12-07 NOTE — Progress Notes (Unsigned)
[name], M.D. Gastroenterology & Hepatology [facility] Gastroenterology [address] [place], [place] [address]  Primary Care Physician: Billie Lade, MD [address] [place] [address]  I will communicate my assessment and recommendations to the referring MD via EMR.  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  The patient was last seen on 06/01/22. 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  She was continued on azathioprine 50 mg every day.  Patient reports  that recently she has been presenting some nausea and vomiting episodes, possibly twice a week. She takes Zofran when this happens but she can have somnolence due to his, so she avoids taking it if possible.  She reports that her choking episodes improved after she had the empiric dilation. Now is having this possibly once 1-2 times a week.  She has noted some fresh blood in you stool, usually when she wipes. Stool is soft now.   The patient denies having any  fever, chills,  melena, hematemesis, abdominal distention, abdominal pain, diarrhea, jaundice, pruritus or weight loss.  Most recent labs from 11/23/2022 showed a normal CMP AST 27 ALT 21 alk phos 70 TB  0.3, CBC WNL.  Last elastography on 12/29/2021 showed a low K PA of 4.5, ratio was 0.13.   Patient was seen by Tollie Pizza on 12/25/2021 for evaluation of dysphagia. She was given  directions to take small sips of water and briefly holding the liquid in her mouth to allow adequate swallowing. She was advised to follow-up with GI as the barium tablet remained in her esophagus. As above due to this, she underwent an EGD on 04/28/2022. No abnormalities were seen in the esophagus, which was empirically dilated with an 18 mm savory.Esophageal biopsies showed mild vascular congestion in the mid esophagus but otherwise were normal. There was presence of a 2 cm hiatal hernia. Normal stomach and duodenum.   Last Colonoscopy: 09/01/22 - Hemorrhoids found on perianal exam. - One 12 to 14 mm polyp in the ascending colon, removed piecemeal using a hot snare. Resected and retrieved. Injected. Clips were placed. Clip manufacturer: [employer]. Tattooed. - Three 3 to 6 mm polyps in the ascending colon, removed with a cold snare. Resected and retrieved. - One 12 mm polyp in the transverse colon, removed with a hot snare. Resected and retrieved. Injected. Clip was placed. Clip manufacturer: [employer]. - Diverticulosis in the sigmoid colon and in the descending colon. - Non- bleeding internal hemorrhoids.  Pathology showed one SSL, 4 colonic tubular adenomas, repeat colonoscopy in 2 years   Past Medical History: Past Medical History:  Diagnosis Date   Anxiety    Arthritis    "hands, arms, back, neck, knees, fingers" (01/21/2016)   Bradycardia    Breast cancer, left breast (HCC) 1970s; 2015   CAD (coronary artery disease)    a. s/p NSTEMI in 2017 with DES to OM and unsuccessful PCI to RCA with wire dissection b. DES to RCA in 2021   Cancer of skin of  leg    BLE   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HCC)    left   Chronic lower back pain    Chronic pain    COPD (chronic obstructive pulmonary disease) (HCC)    Coronary artery disease    Depression    GERD (gastroesophageal reflux disease)    Heart murmur    Hepatic cirrhosis (HCC) 12/20/2017   Hepatitis    History of blood  transfusion    "related to spleen"   History of hiatal hernia    History of kidney stones    Hyperlipidemia    Hypertension    Hypothyroidism    Kidney stone    Melanoma of forearm, left (HCC)    Migraine    "a few migraines/year" (01/21/2016)   Obesity    Stroke (HCC) 04/2003   2 carotid artery stents in place; small left parietal and left hemispheric CVA.[name] 01/21/2016   Tobacco use disorder    50 pack years; questionably discontinued in 2010   Wears glasses     Past Surgical History: Past Surgical History:  Procedure Laterality Date   ABDOMINAL HYSTERECTOMY  1978   APPENDECTOMY     BIOPSY  04/28/2022   Procedure: BIOPSY;  Surgeon: Marguerita Merles, Reuel Boom, MD;  [facility];  Service: Gastroenterology;;   BREAST BIOPSY Left 1970s; 2015   BREAST LUMPECTOMY Left 1970s   BREAST LUMPECTOMY WITH NEEDLE LOCALIZATION Left 07/07/2013   Procedure: BREAST LUMPECTOMY WITH NEEDLE LOCALIZATION;  Surgeon: Robyne Askew, MD;  Location: [facility];  Service: General;  Laterality: Left;   CARDIAC CATHETERIZATION N/A 01/22/2016   Procedure: Left Heart Cath and Coronary Angiography;  Surgeon: Peter M Swaziland, MD;  Location: [facility] INVASIVE CV LAB;  Service: Cardiovascular;  Laterality: N/A;   CARDIAC CATHETERIZATION N/A 01/22/2016   Procedure: Coronary Stent Intervention;  Surgeon: Peter M Swaziland, MD;  Location: [facility] INVASIVE CV LAB;  Service: Cardiovascular;  Laterality: N/A;   CAROTID STENT Left 04/2003   small left parietal and left hemispheric CVA/notes 07/29/2010   CHOLECYSTECTOMY OPEN     COLONOSCOPY  2011   COLONOSCOPY N/A 05/10/2019   Procedure: COLONOSCOPY;  Surgeon: Malissa Hippo, MD;  [facility]: [facility];  Service: Endoscopy;  Laterality: N/A;  1:45   COLONOSCOPY WITH PROPOFOL N/A 09/01/2022   Procedure: COLONOSCOPY WITH PROPOFOL;  Surgeon: Dolores Frame, MD;  [facility]: [facility];  Service: Gastroenterology;  Laterality: N/A;  1:45PM;ASA 3    CORONARY ATHERECTOMY N/A 12/29/2019   Procedure: CORONARY ATHERECTOMY;  Surgeon: Corky Crafts, MD;  Location: [facility] INVASIVE CV LAB;  Service: Cardiovascular;  Laterality: N/A;  Prox RCA   CORONARY STENT INTERVENTION N/A 12/29/2019   Procedure: CORONARY STENT INTERVENTION;  Surgeon: Corky Crafts, MD;  Location: [facility] INVASIVE CV LAB;  Service: Cardiovascular;  Laterality: N/A;  Prox RCA   CORONARY ULTRASOUND/IVUS N/A 12/29/2019   Procedure: Intravascular Ultrasound/IVUS;  Surgeon: Corky Crafts, MD;  Location: [facility] INVASIVE CV LAB;  Service: Cardiovascular;  Laterality: N/A;   ESOPHAGEAL DILATION N/A 04/28/2022   Procedure: ESOPHAGEAL DILATION;  Surgeon: Dolores Frame, MD;  [facility];  Service: Gastroenterology;  Laterality: N/A;   ESOPHAGOGASTRODUODENOSCOPY  08/06/2011   Procedure: ESOPHAGOGASTRODUODENOSCOPY (EGD);  Surgeon: Malissa Hippo, MD;  [facility];  Service: Endoscopy;  Laterality: N/A;   ESOPHAGOGASTRODUODENOSCOPY N/A 05/10/2019   Procedure: ESOPHAGOGASTRODUODENOSCOPY (EGD);  Surgeon: Malissa Hippo, MD;  [facility];  Service: Endoscopy;  Laterality: N/A;  ESOPHAGOGASTRODUODENOSCOPY (EGD) WITH PROPOFOL N/A 04/28/2022   Procedure: ESOPHAGOGASTRODUODENOSCOPY (EGD) WITH PROPOFOL;  Surgeon: Dolores Frame, MD;  [facility];  Service: Gastroenterology;  Laterality: N/A;  10:30 am, pt can't move up due to transportation   FLEXIBLE SIGMOIDOSCOPY  08/06/2011   Procedure: FLEXIBLE SIGMOIDOSCOPY;  Surgeon: Malissa Hippo, MD;  [facility];  Service: Endoscopy;  Laterality: N/A;   HEMOSTASIS CLIP PLACEMENT  09/01/2022   Procedure: HEMOSTASIS CLIP PLACEMENT;  Surgeon: Dolores Frame, MD;  [facility];  Service: Gastroenterology;;   INGUINAL HERNIA REPAIR Left 1970s   IR ANGIO INTRA EXTRACRAN SEL COM CAROTID INNOMINATE BILAT MOD SED  11/01/2020   IR ANGIO VERTEBRAL SEL  VERTEBRAL BILAT MOD SED  11/01/2020   IR RADIOLOGIST EVAL & MGMT  09/23/2020   IR US GUIDE VASC ACCESS RIGHT  11/01/2020   LARYNX SURGERY  1970s   Polyps excised   LEFT HEART CATH AND CORONARY ANGIOGRAPHY N/A 12/29/2019   Procedure: LEFT HEART CATH AND CORONARY ANGIOGRAPHY;  Surgeon: Corky Crafts, MD;  Location: [facility] INVASIVE CV LAB;  Service: Cardiovascular;  Laterality: N/A;   LEFT HEART CATH AND CORONARY ANGIOGRAPHY N/A 10/12/2022   Procedure: LEFT HEART CATH AND CORONARY ANGIOGRAPHY;  Surgeon: Corky Crafts, MD;  Location: [facility] INVASIVE CV LAB;  Service: Cardiovascular;  Laterality: N/A;   MALONEY DILATION  05/10/2019   Procedure: [name] DILATION;  Surgeon: Malissa Hippo, MD;  [facility];  Service: Endoscopy;;   MELANOMA EXCISION Left ~ 2016   forearm   POLYPECTOMY  05/10/2019   Procedure: POLYPECTOMY;  Surgeon: Malissa Hippo, MD;  [facility];  Service: Endoscopy;;  colon   POLYPECTOMY  09/01/2022   Procedure: POLYPECTOMY INTESTINAL;  Surgeon: Dolores Frame, MD;  [facility];  Service: Gastroenterology;;   SPLENECTOMY, 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   spontaneous rupture   SUBMUCOSAL LIFTING INJECTION  09/01/2022   Procedure: SUBMUCOSAL LIFTING INJECTION;  Surgeon: Dolores Frame, MD;  [facility];  Service: Gastroenterology;;   SUBMUCOSAL TATTOO INJECTION  09/01/2022   Procedure: SUBMUCOSAL TATTOO INJECTION;  Surgeon: Dolores Frame, MD;  [facility];  Service: Gastroenterology;;   TEMPORARY PACEMAKER N/A 12/29/2019   Procedure: TEMPORARY PACEMAKER;  Surgeon: Corky Crafts, MD;  Location: [facility] INVASIVE CV LAB;  Service: Cardiovascular;  Laterality: N/A;    Family History: Family History  Problem Relation Age of Onset   Heart failure Mother    Cancer Father    Heart failure Father    Cancer Sister    Dementia Sister    Heart disease Other    Arthritis Other    Cancer Other     Diabetes Other    Kidney disease Other    Cancer Sister    Heart failure Brother     Social History: Social History   Tobacco Use  Smoking Status Every Day   Current packs/day: 0.50   Average packs/day: 0.5 packs/day for 40.0 years (20.0 ttl pk-yrs)   Types: Cigarettes   Passive exposure: Current  Smokeless Tobacco Never   Social History   Substance and Sexual Activity  Alcohol Use No   Alcohol/week: 0.0 standard drinks of alcohol   Social History   Substance and Sexual Activity  Drug Use No    Allergies: Allergies  Allergen Reactions   Gabapentin Nausea And Vomiting   Prednisone Palpitations   Carvedilol     Stopped due to bradycardia  Clonidine Derivatives     Stopped due to bradycardia    Diltiazem     Stopped due to bradycardia    Tape Other (See Comments)    Causes blisters to form   Latex Rash and Other (See Comments)    Blisters    Medications: Current Outpatient Medications  Medication Sig Dispense Refill   albuterol (PROVENTIL HFA;VENTOLIN HFA) 108 (90 Base) MCG/ACT inhaler Inhale 2 puffs into the lungs every 4 (four) hours as needed for wheezing or shortness of breath.     albuterol (PROVENTIL) (2.5 MG/3ML) 0.083% nebulizer solution Take 2.5 mg by nebulization every 6 (six) hours as needed for wheezing or shortness of breath.     amLODipine (NORVASC) 5 MG tablet Take 1 tablet (5 mg total) by mouth daily. 90 tablet 0   aspirin EC 81 MG tablet Take 1 tablet (81 mg total) by mouth daily.     aspirin-acetaminophen-caffeine (EXCEDRIN MIGRAINE) 250-250-65 MG tablet Take 1 tablet by mouth daily as needed for headache.     cetirizine (ZYRTEC) 10 MG tablet Take 10 mg by mouth daily.     Cholecalciferol (VITAMIN D3 SUPER STRENGTH) 50 MCG (2000 UT) CAPS Take 2,000 Units by mouth daily.     citalopram (CELEXA) 10 MG tablet Take 10 mg by mouth daily.     clopidogrel (PLAVIX) 75 MG tablet Take 1 tablet (75 mg total) by mouth daily. 90 tablet 3   clorazepate  (TRANXENE) 7.5 MG tablet Take 7.5 mg by mouth 3 (three) times daily as needed for anxiety.      famotidine (PEPCID) 20 MG tablet Take 1 tablet (20 mg total) by mouth at bedtime. 30 tablet 11   fluticasone (FLONASE) 50 MCG/ACT nasal spray Place 2 sprays into both nostrils as needed for allergies.     furosemide (LASIX) 40 MG tablet Take 1 tablet (40 mg total) by mouth daily as needed for fluid (leg/hand swelling.). 90 tablet 3   HYDROcodone-acetaminophen (NORCO) 10-325 MG tablet Take 1 tablet by mouth every 4 (four) hours as needed for moderate pain.     isosorbide mononitrate (IMDUR) 120 MG 24 hr tablet Take 1 tablet (120 mg total) by mouth daily. 90 tablet 0   isosorbide mononitrate (IMDUR) 60 MG 24 hr tablet Take in addition to your 120 mg tablet for a total of 180 mg daily. 90 tablet 3   JARDIANCE 10 MG TABS tablet Take 10 mg by mouth every morning.     Ketotifen Fumarate (ALLERGY EYE DROPS OP) Place 1 drop into both eyes daily as needed (allergies).     levothyroxine (SYNTHROID, LEVOTHROID) 50 MCG tablet Take 50 mcg by mouth daily before breakfast.      losartan (COZAAR) 25 MG tablet Take 1 tablet (25 mg total) by mouth daily. 90 tablet 3   nitroGLYCERIN (NITROSTAT) 0.4 MG SL tablet Place 0.4 mg under the tongue every 5 (five) minutes as needed for chest pain.       ondansetron (ZOFRAN) 4 MG tablet Take 1 tablet (4 mg total) by mouth 3 (three) times daily as needed. 30 tablet 1   pantoprazole (PROTONIX) 40 MG tablet TAKE 1 TABLET BY MOUTH ONCE A DAY. 90 tablet 0   potassium chloride SA (KLOR-CON M) 20 MEQ tablet Take 1 tablet (20 mEq total) by mouth daily as needed (Take only when you take Lasix). 30 tablet 11   rosuvastatin (CRESTOR) 40 MG tablet Take 1 tablet (40 mg total) by mouth daily. 90 tablet 3  tamsulosin (FLOMAX) 0.4 MG CAPS capsule Take 0.4 mg by mouth daily. For Kidney Stones (Patient not taking: Reported on 12/07/2022)     No current facility-administered medications for this  visit.    Review of Systems: GENERAL: negative for malaise, night sweats HEENT: No changes in hearing or vision, no nose bleeds or other nasal problems. NECK: Negative for lumps, goiter, pain and significant neck swelling RESPIRATORY: Negative for cough, wheezing CARDIOVASCULAR: Negative for chest pain, leg swelling, palpitations, orthopnea GI: SEE HPI MUSCULOSKELETAL: Negative for joint pain or swelling, back pain, and muscle pain. SKIN: Negative for lesions, rash PSYCH: Negative for sleep disturbance, mood disorder and recent psychosocial stressors. HEMATOLOGY Negative for prolonged bleeding, bruising easily, and swollen nodes. ENDOCRINE: Negative for cold or heat intolerance, polyuria, polydipsia and goiter. NEURO: negative for tremor, gait imbalance, syncope and seizures. The remainder of the review of systems is noncontributory.   Physical Exam: BP 112/67   Pulse 73   Temp 98.1 F (36.7 C)   Ht 5\' 3"  (1.6 m)   Wt 177 lb 3.2 oz (80.4 kg)   BMI 31.39 kg/m  GENERAL: The patient is AO x3, in no acute distress. HEENT: Head is normocephalic and atraumatic. EOMI are intact. Mouth is well hydrated and without lesions. NECK: Supple. No masses LUNGS: Clear to auscultation. No presence of rhonchi/wheezing/rales. Adequate chest expansion HEART: RRR, normal s1 and s2. ABDOMEN: Soft, nontender, no guarding, no peritoneal signs, and nondistended. BS +. No masses. EXTREMITIES: Without any cyanosis, clubbing, rash, lesions or edema. NEUROLOGIC: AOx3, no focal motor deficit. SKIN: no jaundice, no rashes  Imaging/Labs: as above  I personally reviewed and interpreted the available labs, imaging and endoscopic files.  Impression and Plan: ALLEENA GANDOLFO is a 72 y.o. 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  Patient has presented adequate response with AZA and URSO for management of overlapping PBC and AIH. Will continue at same dosage these medications. Will consider repeating an elastography in 2025.  In terms of his swallowing issues, she had some improvement after having empiric dilation. Will continue with dysphagia recommendations for now, but if symptoms persist, will need to proceed with esophageal manometry.  - Continue azathioprine 50 mg qday -Continue ursodiol 500 mg twice a day -Continue implementing swallowing recommendations - take small sips of water and briefly holding the liquid in her mouth to allow adequate swallowing -Continue Zofran as needed for nausea/vomiting. - Consider repeat elastography in 12/2023  All questions were answered.      [name] Blazing, MD Gastroenterology and Hepatology [facility] Gastroenterology

## 2022-12-08 ENCOUNTER — Encounter (HOSPITAL_COMMUNITY): Payer: Self-pay

## 2022-12-08 ENCOUNTER — Telehealth (HOSPITAL_COMMUNITY): Payer: Self-pay

## 2022-12-08 NOTE — Telephone Encounter (Signed)
Called to confirm 12/10/22 appt number listed is not working, sent mychart message to confirm appt by 12:00 PM 12/09/22

## 2022-12-09 ENCOUNTER — Encounter: Payer: Self-pay | Admitting: Psychiatry

## 2022-12-09 ENCOUNTER — Ambulatory Visit (INDEPENDENT_AMBULATORY_CARE_PROVIDER_SITE_OTHER): Payer: 59 | Admitting: Psychiatry

## 2022-12-09 VITALS — BP 124/82 | HR 64 | Ht 63.0 in | Wt 178.8 lb

## 2022-12-09 DIAGNOSIS — R531 Weakness: Secondary | ICD-10-CM

## 2022-12-09 DIAGNOSIS — M26622 Arthralgia of left temporomandibular joint: Secondary | ICD-10-CM | POA: Diagnosis not present

## 2022-12-09 MED ORDER — NORTRIPTYLINE HCL 10 MG PO CAPS: 10.0000 mg | ORAL_CAPSULE | Freq: Every day | ORAL | 6 refills | Status: DC

## 2022-12-09 MED ORDER — CYCLOBENZAPRINE HCL 5 MG PO TABS: 5.0000 mg | ORAL_TABLET | Freq: Every evening | ORAL | 6 refills | Status: DC | PRN

## 2022-12-09 MED ORDER — LORAZEPAM 0.5 MG PO TABS: ORAL_TABLET | ORAL | 0 refills | Status: DC

## 2022-12-09 NOTE — Progress Notes (Signed)
[facility]  PATIENT: [name] DOB: 01/30/1951  REFERRING CLINICIAN: Del York Pellant* HISTORY FROM: self REASON FOR VISIT: facial pain   HISTORICAL  CHIEF COMPLAINT:  Chief Complaint  Patient presents with   New Patient (Initial Visit)    Pt in room 16.Michelle case worker in room. New patient here for Trigeminal neuralgia of left side of face. Pt  states symptoms have been going on for 3 months, reports face feel numb, pt said right side of face cracks.     HISTORY OF PRESENT ILLNESS:  The patient presents for evaluation of left-sided facial pain for the past 3 months. Her left jaw will pop and send shooting pain up to her ear. It is worse with eating, talking, and touching her face. Pain is constant but fluctuates in severity. Has had a couple of episodes where she felt electrical, shocking pain which lasted for one second. Face feels numb on the left. She saw her PCP for this who started her on carbamazepine 100 mg BID but she couldn't tolerate this because it made her "feel drunk".   Of note, she was recently seen in the ED for new left sided weakness and numbness. CTH in the ED was unremarkable. Symptoms have improved somewhat, but she continues to have mild weakness and numbness on that side.   FACIAL PAIN FEATURES  Side: left Distribution: V2-3 Any pain on side or back of head: no Character: aching "like a tooth ache", rare shocking and electrical pain Duration: constant aching, rare electrical pains Pain-free between episodes: no Triggers: chewing Sensory abnormalities: numbness on left side of face Tried Tegretol/Trileptal: yes, couldn't tolerate carbamazepine Prior procedures/outcome: none History of MS, Lyme's disease, facial rash: none History of dental/oral surgery, facial/plastic surgery: none   OTHER MEDICAL CONDITIONS: CVA in 2005, internal carotid dissection s/p aneurysm rupture, CAD c/b NSTEMI, CHF, HTN, HLD, autoimmune  hepatitis, COPD   REVIEW OF SYSTEMS: Full 14 system review of systems performed and negative with exception of: facial pain  ALLERGIES: Allergies  Allergen Reactions   Gabapentin Nausea And Vomiting   Prednisone Palpitations   Carvedilol     Stopped due to bradycardia   Clonidine Derivatives     Stopped due to bradycardia    Diltiazem     Stopped due to bradycardia    Tape Other (See Comments)    Causes blisters to form   Latex Rash and Other (See Comments)    Blisters    HOME MEDICATIONS: Outpatient Medications Prior to Visit  Medication Sig Dispense Refill   albuterol (PROVENTIL HFA;VENTOLIN HFA) 108 (90 Base) MCG/ACT inhaler Inhale 2 puffs into the lungs every 4 (four) hours as needed for wheezing or shortness of breath.     albuterol (PROVENTIL) (2.5 MG/3ML) 0.083% nebulizer solution Take 2.5 mg by nebulization every 6 (six) hours as needed for wheezing or shortness of breath.     amLODipine (NORVASC) 5 MG tablet Take 1 tablet (5 mg total) by mouth daily. 90 tablet 0   aspirin EC 81 MG tablet Take 1 tablet (81 mg total) by mouth daily.     aspirin-acetaminophen-caffeine (EXCEDRIN MIGRAINE) 250-250-65 MG tablet Take 1 tablet by mouth daily as needed for headache.     azaTHIOprine (IMURAN) 50 MG tablet Take 1 tablet (50 mg total) by mouth daily. 90 tablet 2   cetirizine (ZYRTEC) 10 MG tablet Take 10 mg by mouth daily.     Cholecalciferol (VITAMIN D3 SUPER STRENGTH) 50 MCG (2000 UT)  CAPS Take 2,000 Units by mouth daily.     clopidogrel (PLAVIX) 75 MG tablet Take 1 tablet (75 mg total) by mouth daily. 90 tablet 3   clorazepate (TRANXENE) 7.5 MG tablet Take 7.5 mg by mouth 3 (three) times daily as needed for anxiety.      famotidine (PEPCID) 20 MG tablet Take 1 tablet (20 mg total) by mouth at bedtime. 30 tablet 11   fluticasone (FLONASE) 50 MCG/ACT nasal spray Place 2 sprays into both nostrils as needed for allergies.     furosemide (LASIX) 40 MG tablet Take 1 tablet (40 mg  total) by mouth daily as needed for fluid (leg/hand swelling.). 90 tablet 3   HYDROcodone-acetaminophen (NORCO) 10-325 MG tablet Take 1 tablet by mouth every 4 (four) hours as needed for moderate pain.     isosorbide mononitrate (IMDUR) 120 MG 24 hr tablet Take 1 tablet (120 mg total) by mouth daily. 90 tablet 0   isosorbide mononitrate (IMDUR) 60 MG 24 hr tablet Take in addition to your 120 mg tablet for a total of 180 mg daily. 90 tablet 3   JARDIANCE 10 MG TABS tablet Take 10 mg by mouth every morning.     Ketotifen Fumarate (ALLERGY EYE DROPS OP) Place 1 drop into both eyes daily as needed (allergies).     levothyroxine (SYNTHROID, LEVOTHROID) 50 MCG tablet Take 50 mcg by mouth daily before breakfast.      losartan (COZAAR) 25 MG tablet Take 1 tablet (25 mg total) by mouth daily. 90 tablet 3   nitroGLYCERIN (NITROSTAT) 0.4 MG SL tablet Place 0.4 mg under the tongue every 5 (five) minutes as needed for chest pain.       ondansetron (ZOFRAN) 4 MG tablet Take 1 tablet (4 mg total) by mouth 3 (three) times daily as needed. 30 tablet 1   pantoprazole (PROTONIX) 40 MG tablet TAKE 1 TABLET BY MOUTH ONCE A DAY. 90 tablet 0   potassium chloride SA (KLOR-CON M) 20 MEQ tablet Take 1 tablet (20 mEq total) by mouth daily as needed (Take only when you take Lasix). 30 tablet 11   rosuvastatin (CRESTOR) 40 MG tablet Take 1 tablet (40 mg total) by mouth daily. 90 tablet 3   tamsulosin (FLOMAX) 0.4 MG CAPS capsule Take 0.4 mg by mouth daily. For Kidney Stones     ursodiol (ACTIGALL) 500 MG tablet Take 1 tablet (500 mg total) by mouth 2 (two) times daily. 180 tablet 3   citalopram (CELEXA) 10 MG tablet Take 10 mg by mouth daily.     No facility-administered medications prior to visit.    PAST MEDICAL HISTORY: Past Medical History:  Diagnosis Date   Anxiety    Arthritis    "hands, arms, back, neck, knees, fingers" (01/21/2016)   Bradycardia    Breast cancer, left breast (HCC) 1970s; 2015   CAD (coronary  artery disease)    a. s/p NSTEMI in 2017 with DES to OM and unsuccessful PCI to RCA with wire dissection b. DES to RCA in 2021   Cancer of skin of leg    BLE   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HCC)    left   Chronic lower back pain    Chronic pain    COPD (chronic obstructive pulmonary disease) (HCC)    Coronary artery disease    Depression    GERD (gastroesophageal reflux disease)    Heart murmur    Hepatic cirrhosis (HCC) 12/20/2017   Hepatitis    History of blood  transfusion    "related to spleen"   History of hiatal hernia    History of kidney stones    Hyperlipidemia    Hypertension    Hypothyroidism    Kidney stone    Melanoma of forearm, left (HCC)    Migraine    "a few migraines/year" (01/21/2016)   Obesity    Stroke (HCC) 04/2003   2 carotid artery stents in place; small left parietal and left hemispheric CVA.[name] 01/21/2016   Tobacco use disorder    50 pack years; questionably discontinued in 2010   Wears glasses     PAST SURGICAL HISTORY: Past Surgical History:  Procedure Laterality Date   ABDOMINAL HYSTERECTOMY  1978   APPENDECTOMY     BIOPSY  04/28/2022   Procedure: BIOPSY;  Surgeon: Marguerita Merles, Reuel Boom, MD;  [facility]: [facility];  Service: Gastroenterology;;   BREAST BIOPSY Left 1970s; 2015   BREAST LUMPECTOMY Left 1970s   BREAST LUMPECTOMY WITH NEEDLE LOCALIZATION Left 07/07/2013   Procedure: BREAST LUMPECTOMY WITH NEEDLE LOCALIZATION;  Surgeon: Robyne Askew, MD;  Location: [facility];  Service: General;  Laterality: Left;   CARDIAC CATHETERIZATION N/A 01/22/2016   Procedure: Left Heart Cath and Coronary Angiography;  Surgeon: Peter M Swaziland, MD;  Location: [facility] INVASIVE CV LAB;  Service: Cardiovascular;  Laterality: N/A;   CARDIAC CATHETERIZATION N/A 01/22/2016   Procedure: Coronary Stent Intervention;  Surgeon: Peter M Swaziland, MD;  Location: [facility] INVASIVE CV LAB;  Service: Cardiovascular;  Laterality: N/A;   CAROTID STENT Left 04/2003    small left parietal and left hemispheric CVA/notes 07/29/2010   CHOLECYSTECTOMY OPEN     COLONOSCOPY  2011   COLONOSCOPY N/A 05/10/2019   Procedure: COLONOSCOPY;  Surgeon: Malissa Hippo, MD;  Location: [facility];  Service: Endoscopy;  Laterality: N/A;  1:45   COLONOSCOPY WITH PROPOFOL N/A 09/01/2022   Procedure: COLONOSCOPY WITH PROPOFOL;  Surgeon: Dolores Frame, MD;  [facility]: [facility];  Service: Gastroenterology;  Laterality: N/A;  1:45PM;ASA 3   CORONARY ATHERECTOMY N/A 12/29/2019   Procedure: CORONARY ATHERECTOMY;  Surgeon: Corky Crafts, MD;  Location: [facility] INVASIVE CV LAB;  Service: Cardiovascular;  Laterality: N/A;  Prox RCA   CORONARY STENT INTERVENTION N/A 12/29/2019   Procedure: CORONARY STENT INTERVENTION;  Surgeon: Corky Crafts, MD;  Location: [facility] INVASIVE CV LAB;  Service: Cardiovascular;  Laterality: N/A;  Prox RCA   CORONARY ULTRASOUND/IVUS N/A 12/29/2019   Procedure: Intravascular Ultrasound/IVUS;  Surgeon: Corky Crafts, MD;  Location: [facility] INVASIVE CV LAB;  Service: Cardiovascular;  Laterality: N/A;   ESOPHAGEAL DILATION N/A 04/28/2022   Procedure: ESOPHAGEAL DILATION;  Surgeon: Dolores Frame, MD;  [facility];  Service: Gastroenterology;  Laterality: N/A;   ESOPHAGOGASTRODUODENOSCOPY  08/06/2011   Procedure: ESOPHAGOGASTRODUODENOSCOPY (EGD);  Surgeon: Malissa Hippo, MD;  [facility];  Service: Endoscopy;  Laterality: N/A;   ESOPHAGOGASTRODUODENOSCOPY N/A 05/10/2019   Procedure: ESOPHAGOGASTRODUODENOSCOPY (EGD);  Surgeon: Malissa Hippo, MD;  [facility];  Service: Endoscopy;  Laterality: N/A;   ESOPHAGOGASTRODUODENOSCOPY (EGD) WITH PROPOFOL N/A 04/28/2022   Procedure: ESOPHAGOGASTRODUODENOSCOPY (EGD) WITH PROPOFOL;  Surgeon: Dolores Frame, MD;  [facility]: [facility];  Service: Gastroenterology;  Laterality: N/A;  10:30 am, pt can't move up due to transportation   FLEXIBLE  SIGMOIDOSCOPY  08/06/2011   Procedure: FLEXIBLE SIGMOIDOSCOPY;  Surgeon: Malissa Hippo, MD;  [facility];  Service: Endoscopy;  Laterality: N/A;   HEMOSTASIS  CLIP PLACEMENT  09/01/2022   Procedure: HEMOSTASIS CLIP PLACEMENT;  Surgeon: Dolores Frame, MD;  [facility];  Service: Gastroenterology;;   INGUINAL HERNIA REPAIR Left 1970s   IR ANGIO INTRA EXTRACRAN SEL COM CAROTID INNOMINATE BILAT MOD SED  11/01/2020   IR ANGIO VERTEBRAL SEL VERTEBRAL BILAT MOD SED  11/01/2020   IR RADIOLOGIST EVAL & MGMT  09/23/2020   IR US GUIDE VASC ACCESS RIGHT  11/01/2020   LARYNX SURGERY  1970s   Polyps excised   LEFT HEART CATH AND CORONARY ANGIOGRAPHY N/A 12/29/2019   Procedure: LEFT HEART CATH AND CORONARY ANGIOGRAPHY;  Surgeon: Corky Crafts, MD;  Location: [facility] INVASIVE CV LAB;  Service: Cardiovascular;  Laterality: N/A;   LEFT HEART CATH AND CORONARY ANGIOGRAPHY N/A 10/12/2022   Procedure: LEFT HEART CATH AND CORONARY ANGIOGRAPHY;  Surgeon: Corky Crafts, MD;  Location: [facility] INVASIVE CV LAB;  Service: Cardiovascular;  Laterality: N/A;   MALONEY DILATION  05/10/2019   Procedure: [name] DILATION;  Surgeon: Malissa Hippo, MD;  [facility];  Service: Endoscopy;;   MELANOMA EXCISION Left ~ 2016   forearm   POLYPECTOMY  05/10/2019   Procedure: POLYPECTOMY;  Surgeon: Malissa Hippo, MD;  [facility];  Service: Endoscopy;;  colon   POLYPECTOMY  09/01/2022   Procedure: POLYPECTOMY INTESTINAL;  Surgeon: Dolores Frame, MD;  [facility]: [facility];  Service: Gastroenterology;;   SPLENECTOMY, 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   spontaneous rupture   SUBMUCOSAL LIFTING INJECTION  09/01/2022   Procedure: SUBMUCOSAL LIFTING INJECTION;  Surgeon: Dolores Frame, MD;  [facility]: [facility];  Service: Gastroenterology;;   SUBMUCOSAL TATTOO INJECTION  09/01/2022   Procedure: SUBMUCOSAL TATTOO INJECTION;  Surgeon: Dolores Frame, MD;   [facility];  Service: Gastroenterology;;   TEMPORARY PACEMAKER N/A 12/29/2019   Procedure: TEMPORARY PACEMAKER;  Surgeon: Corky Crafts, MD;  Location: [facility] INVASIVE CV LAB;  Service: Cardiovascular;  Laterality: N/A;    FAMILY HISTORY: Family History  Problem Relation Age of Onset   Heart failure Mother    Cancer Father    Heart failure Father    Cancer Sister    Dementia Sister    Heart disease Other    Arthritis Other    Cancer Other    Diabetes Other    Kidney disease Other    Cancer Sister    Heart failure Brother     SOCIAL HISTORY: Social History   Socioeconomic History   Marital status: Divorced    Spouse name: Not on file   Number of children: 2   Years of education: Not on file   Highest education level: Not on file  Occupational History   Occupation: Disabled   Occupation: retired  Tobacco Use   Smoking status: Every Day    Current packs/day: 0.50    Average packs/day: 0.5 packs/day for 40.0 years (20.0 ttl pk-yrs)    Types: Cigarettes    Passive exposure: Current   Smokeless tobacco: Never  Vaping Use   Vaping status: Never Used  Substance and Sexual Activity   Alcohol use: No    Alcohol/week: 0.0 standard drinks of alcohol   Drug use: No   Sexual activity: Not Currently  Other Topics Concern   Not on file  Social History Narrative   ** Merged History Encounter **       Right handed   Wears glasses    Drinks 1 cup of coffee daily  Drinks sweet tea twice a week.   Social Determinants of Health   Financial Resource Strain: Not on file  Food Insecurity: Not on file  Transportation Needs: Not on file  Physical Activity: Not on file  Stress: Not on file  Social Connections: Not on file  Intimate Partner Violence: Not on file     PHYSICAL EXAM  GENERAL EXAM/CONSTITUTIONAL: Vitals:  Vitals:   12/09/22 1246 12/09/22 1251  BP: (!) 146/79 124/82  Pulse: 62 64  Weight: 178 lb 12.8 oz (81.1 kg)   Height: 5\' 3"  (1.6 m)     Body mass index is 31.67 kg/m. Wt Readings from Last 3 Encounters:  12/09/22 178 lb 12.8 oz (81.1 kg)  12/07/22 177 lb 3.2 oz (80.4 kg)  11/27/22 179 lb (81.2 kg)    NEUROLOGIC: MENTAL STATUS:  awake, alert, oriented to person, place and time recent and remote memory intact normal attention and concentration language fluent, comprehension intact, naming intact fund of knowledge appropriate  CRANIAL NERVE:  2nd, 3rd, 4th, 6th - pupils equal and reactive to light, visual fields full to confrontation, extraocular muscles intact, no nystagmus 5th - decreased sensation left V1-3 7th - facial strength symmetric 8th - hearing intact 9th - palate elevates symmetrically, uvula midline 11th - shoulder shrug symmetric 12th - tongue protrusion midline  MOTOR:  4+/5 strength LUE and 4/5 LLE, 5/5 RUE/RLE  SENSORY:  Decreased sensation to light touch LUE and LLE  COORDINATION:  finger-nose-finger, fine finger movements normal  REFLEXES:  deep tendon reflexes present and symmetric   +TMJ click L>R   DIAGNOSTIC DATA (LABS, IMAGING, TESTING) - I reviewed patient records, labs, notes, testing and imaging myself where available.  Lab Results  Component Value Date   WBC 6.3 11/23/2022   HGB 14.1 11/23/2022   HCT 42.6 11/23/2022   MCV 90.3 11/23/2022   PLT 355 11/23/2022      Component Value Date/Time   NA 135 11/23/2022 1540   NA 140 10/19/2022 1045   K 3.5 11/23/2022 1540   CL 100 11/23/2022 1540   CO2 29 11/23/2022 1540   GLUCOSE 148 (H) 11/23/2022 1540   BUN 11 11/23/2022 1540   BUN 10 10/19/2022 1045   CREATININE 0.68 11/23/2022 1540   CREATININE 0.81 11/24/2021 0831   CALCIUM 8.6 (L) 11/23/2022 1540   PROT 6.7 11/23/2022 1540   PROT 6.9 10/19/2022 1045   ALBUMIN 3.5 11/23/2022 1540   ALBUMIN 4.2 10/19/2022 1045   AST 27 11/23/2022 1540   ALT 21 11/23/2022 1540   ALKPHOS 70 11/23/2022 1540   BILITOT 0.3 11/23/2022 1540   BILITOT 0.4 10/19/2022 1045    GFRNONAA >60 11/23/2022 1540   GFRNONAA 85 10/10/2019 1031   GFRAA 99 10/10/2019 1031   Lab Results  Component Value Date   CHOL 132 10/19/2022   HDL 42 10/19/2022   LDLCALC 71 10/19/2022   TRIG 104 10/19/2022   CHOLHDL 3.1 10/19/2022   Lab Results  Component Value Date   HGBA1C 7.1 (H) 10/19/2022   Lab Results  Component Value Date   VITAMINB12 419 10/19/2022   Lab Results  Component Value Date   TSH 1.641 11/23/2022     ASSESSMENT AND PLAN 72 y.o.  female with a history of CVA in 2005, internal carotid dissection s/p aneurysm rupture, CAD c/b NSTEMI, CHF, HTN, HLD, autoimmune hepatitis, COPD who presents for evaluation of left-sided facial pain. Suspect TMJ as she has a prominent TMJ click bilaterally but worse on the  left. Exam also reveals decreased sensation and weakness of the left hemibody, which she states has been new in the past 2 weeks. She has a history of multiple stents and recently had hemostasis clips placed in her colon. Will see if we can obtain MRI of the brain and face to both assess for trigeminal nerve compression/TMJ changes, and to evaluate for new stroke. Will start low dose nortriptyline and PRN flexeril for her jaw pain. Advised her to follow up with her dentist for further TMJ treatment.   1. Arthralgia of left temporomandibular joint   2. Left-sided weakness      PLAN: -MRI brain, face/trigeminal -Start nortriptyline 10 mg at bedtime -Start flexeril 5 mg at bedtime PRN  Orders Placed This Encounter  Procedures   MR BRAIN W WO CONTRAST   MR FACE/TRIGEMINAL WO/W CM   Ambulatory referral to Physical Therapy    Meds ordered this encounter  Medications   nortriptyline (PAMELOR) 10 MG capsule    Sig: Take 1 capsule (10 mg total) by mouth at bedtime.    Dispense:  60 capsule    Refill:  6   cyclobenzaprine (FLEXERIL) 5 MG tablet    Sig: Take 1 tablet (5 mg total) by mouth at bedtime as needed for muscle spasms.    Dispense:  30 tablet     Refill:  6   LORazepam (ATIVAN) 0.5 MG tablet    Sig: Take 1-2 pills 30 minutes prior to MRI    Dispense:  2 tablet    Refill:  0    Return in about 6 months (around 06/08/2023).    Ocie Doyne, MD 12/09/22 1:38 PM  I spent an average of 49 minutes chart reviewing and counseling the patient, with at least 50% of the time face to face with the patient.   [facility] [address], [address] [place], [place] [address] [phone]

## 2022-12-10 ENCOUNTER — Telehealth: Payer: Self-pay | Admitting: Psychiatry

## 2022-12-10 ENCOUNTER — Ambulatory Visit (HOSPITAL_COMMUNITY): Payer: 59 | Admitting: Psychiatry

## 2022-12-10 ENCOUNTER — Encounter (HOSPITAL_COMMUNITY): Payer: Self-pay | Admitting: Psychiatry

## 2022-12-10 DIAGNOSIS — F410 Panic disorder [episodic paroxysmal anxiety] without agoraphobia: Secondary | ICD-10-CM | POA: Diagnosis not present

## 2022-12-10 DIAGNOSIS — Z79891 Long term (current) use of opiate analgesic: Secondary | ICD-10-CM

## 2022-12-10 DIAGNOSIS — Z79899 Other long term (current) drug therapy: Secondary | ICD-10-CM

## 2022-12-10 DIAGNOSIS — F5104 Psychophysiologic insomnia: Secondary | ICD-10-CM

## 2022-12-10 DIAGNOSIS — F331 Major depressive disorder, recurrent, moderate: Secondary | ICD-10-CM

## 2022-12-10 DIAGNOSIS — F411 Generalized anxiety disorder: Secondary | ICD-10-CM

## 2022-12-10 DIAGNOSIS — F17200 Nicotine dependence, unspecified, uncomplicated: Secondary | ICD-10-CM

## 2022-12-10 HISTORY — DX: Other long term (current) drug therapy: Z79.899

## 2022-12-10 MED ORDER — CLORAZEPATE DIPOTASSIUM 7.5 MG PO TABS
7.5000 mg | ORAL_TABLET | Freq: Two times a day (BID) | ORAL | 0 refills | Status: DC | PRN
Start: 2022-12-10 — End: 2023-01-07

## 2022-12-10 MED ORDER — SERTRALINE HCL 25 MG PO TABS
12.5000 mg | ORAL_TABLET | Freq: Every day | ORAL | 1 refills | Status: DC
Start: 2022-12-10 — End: 2023-01-07

## 2022-12-10 NOTE — Telephone Encounter (Signed)
[employer] medicare NPR sent to [employer] [phone]

## 2022-12-10 NOTE — Patient Instructions (Addendum)
We decreased the clorazepate to 7.5 mg twice daily as needed for panic.  We will plan on decreasing this by 7.5 mg every month until you are no longer on it.  We also started sertraline (Zoloft) 12.5 mg (half a tablet) once daily.  I would recommend trying to have no caffeine after 12 PM as this can impact her ability to fall asleep and stay asleep at night and will worsen your anxiety.  You can have 1 caffeinated beverage per day first thing in the morning.
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
## 2022-12-14 ENCOUNTER — Other Ambulatory Visit: Payer: Self-pay | Admitting: Cardiology

## 2022-12-15 ENCOUNTER — Other Ambulatory Visit: Payer: Self-pay | Admitting: Family Medicine

## 2022-12-15 ENCOUNTER — Ambulatory Visit (INDEPENDENT_AMBULATORY_CARE_PROVIDER_SITE_OTHER): Payer: 59

## 2022-12-15 VITALS — Ht 63.0 in | Wt 178.0 lb

## 2022-12-15 DIAGNOSIS — Z87891 Personal history of nicotine dependence: Secondary | ICD-10-CM

## 2022-12-15 DIAGNOSIS — F17200 Nicotine dependence, unspecified, uncomplicated: Secondary | ICD-10-CM | POA: Diagnosis not present

## 2022-12-15 DIAGNOSIS — Z0001 Encounter for general adult medical examination with abnormal findings: Secondary | ICD-10-CM | POA: Diagnosis not present

## 2022-12-15 DIAGNOSIS — Z0000 Encounter for general adult medical examination without abnormal findings: Secondary | ICD-10-CM

## 2022-12-15 NOTE — Patient Instructions (Addendum)
[name] , Thank you for taking time to come for your Medicare Wellness Visit. I appreciate your ongoing commitment to your health goals. Please review the following plan we discussed and let me know if I can assist you in the future.   Referrals/Orders/Follow-Ups/Clinician Recommendations:  Next Medicare Annual Wellness Visit: March 22, 2024 at 11:20am virtual visit  Lung cancer screening:  You have been referred to have a low dose lung cancer screening test. If you haven't heard from them in a week please call one of the numbers listed below.   [name] [address] [place] [address] [name] Phone: [phone] [name] Phone: [phone]  This is a list of the screening recommended for you and due dates:  Health Maintenance  Topic Date Due   Pneumonia Vaccine (1 of 2 - PCV) 01/05/1957   Complete foot exam   Never done   Eye exam for diabetics  Never done   DTaP/Tdap/Td vaccine (1 - Tdap) Never done   Zoster (Shingles) Vaccine (1 of 2) Never done   Screening for Lung Cancer  Never done   COVID-19 Vaccine (3 - Moderna risk series) 08/04/2019   Flu Shot  10/15/2022   Hemoglobin A1C  04/21/2023   DEXA scan (bone density measurement)  04/30/2023   Yearly kidney health urinalysis for diabetes  10/19/2023   Mammogram  10/26/2023   Yearly kidney function blood test for diabetes  11/23/2023   Medicare Annual Wellness Visit  12/15/2023   Colon Cancer Screening  08/31/2024   Hepatitis C Screening  Completed   HPV Vaccine  Aged Out    Advanced directives: (ACP Link)Information on Advanced Care Planning can be found at [facility] [place] Advance Health Care Directives Advance Health Care Directives ([email or web address]   Next Medicare Annual Wellness Visit scheduled for next year: Yes  Preventive Care 65 Years and Older, Female Preventive care refers to lifestyle choices and visits with your health care provider that can promote health and wellness. Preventive  care visits are also called wellness exams. What can I expect for my preventive care visit? Counseling Your health care provider may ask you questions about your: Medical history, including: Past medical problems. Family medical history. Pregnancy and menstrual history. History of falls. Current health, including: Memory and ability to understand (cognition). Emotional well-being. Home life and relationship well-being. Sexual activity and sexual health. Lifestyle, including: Alcohol, nicotine or tobacco, and drug use. Access to firearms. Diet, exercise, and sleep habits. Work and work Astronomer. Sunscreen use. Safety issues such as seatbelt and bike helmet use. Physical exam Your health care provider will check your: Height and weight. These may be used to calculate your BMI (body mass index). BMI is a measurement that tells if you are at a healthy weight. Waist circumference. This measures the distance around your waistline. This measurement also tells if you are at a healthy weight and may help predict your risk of certain diseases, such as type 2 diabetes and high blood pressure. Heart rate and blood pressure. Body temperature. Skin for abnormal spots. What immunizations do I need?  Vaccines are usually given at various ages, according to a schedule. Your health care provider will recommend vaccines for you based on your age, medical history, and lifestyle or other factors, such as travel or where you work. What tests do I need? Screening Your health care provider may recommend screening tests for certain conditions. This may include: Lipid and cholesterol levels. Hepatitis C test. Hepatitis B test.  HIV (human immunodeficiency virus) test. STI (sexually transmitted infection) testing, if you are at risk. Lung cancer screening. Colorectal cancer screening. Diabetes screening. This is done by checking your blood sugar (glucose) after you have not eaten for a while  (fasting). Mammogram. Talk with your health care provider about how often you should have regular mammograms. BRCA-related cancer screening. This may be done if you have a family history of breast, ovarian, tubal, or peritoneal cancers. Bone density scan. This is done to screen for osteoporosis. Talk with your health care provider about your test results, treatment options, and if necessary, the need for more tests. Follow these instructions at home: Eating and drinking  Eat a diet that includes fresh fruits and vegetables, whole grains, lean protein, and low-fat dairy products. Limit your intake of foods with high amounts of sugar, saturated fats, and salt. Take vitamin and mineral supplements as recommended by your health care provider. Do not drink alcohol if your health care provider tells you not to drink. If you drink alcohol: Limit how much you have to 0-1 drink a day. Know how much alcohol is in your drink. In the [place] one drink equals one 12 oz bottle of beer (355 mL), one 5 oz glass of wine (148 mL), or one 1 oz glass of hard liquor (44 mL). Lifestyle Brush your teeth every morning and night with fluoride toothpaste. Floss one time each day. Exercise for at least 30 minutes 5 or more days each week. Do not use any products that contain nicotine or tobacco. These products include cigarettes, chewing tobacco, and vaping devices, such as e-cigarettes. If you need help quitting, ask your health care provider. Do not use drugs. If you are sexually active, practice safe sex. Use a condom or other form of protection in order to prevent STIs. Take aspirin only as told by your health care provider. Make sure that you understand how much to take and what form to take. Work with your health care provider to find out whether it is safe and beneficial for you to take aspirin daily. Ask your health care provider if you need to take a cholesterol-lowering medicine (statin). Find healthy ways to  manage stress, such as: Meditation, yoga, or listening to music. Journaling. Talking to a trusted person. Spending time with friends and family. Minimize exposure to UV radiation to reduce your risk of skin cancer. Safety Always wear your seat belt while driving or riding in a vehicle. Do not drive: If you have been drinking alcohol. Do not ride with someone who has been drinking. When you are tired or distracted. While texting. If you have been using any mind-altering substances or drugs. Wear a helmet and other protective equipment during sports activities. If you have firearms in your house, make sure you follow all gun safety procedures. What's next? Visit your health care provider once a year for an annual wellness visit. Ask your health care provider how often you should have your eyes and teeth checked. Stay up to date on all vaccines. This information is not intended to replace advice given to you by your health care provider. Make sure you discuss any questions you have with your health care provider. Document Revised: 08/28/2020 Document Reviewed: 08/28/2020 Elsevier Patient Education  2024 [employer]. Understanding Your Risk for Falls Millions of people have serious injuries from falls each year. It is important to understand your risk of falling. Talk with your health care provider about your risk and what you can  do to lower it. If you do have a serious fall, make sure to tell your provider. Falling once raises your risk of falling again. How can falls affect me? Serious injuries from falls are common. These include: Broken bones, such as hip fractures. Head injuries, such as traumatic brain injuries (TBI) or concussions. A fear of falling can cause you to avoid activities and stay at home. This can make your muscles weaker and raise your risk for a fall. What can increase my risk? There are a number of risk factors that increase your risk for falling. The more risk  factors you have, the higher your risk of falling. Serious injuries from a fall happen most often to people who are older than 73 years old. Teenagers and young adults ages 71-29 are also at higher risk. Common risk factors include: Weakness in the lower body. Being generally weak or confused due to long-term (chronic) illness. Dizziness or balance problems. Poor vision. Medicines that cause dizziness or drowsiness. These may include: Medicines for your blood pressure, heart, anxiety, insomnia, or swelling (edema). Pain medicines. Muscle relaxants. Other risk factors include: Drinking alcohol. Having had a fall in the past. Having foot pain or wearing improper footwear. Working at a dangerous job. Having any of the following in your home: Tripping hazards, such as floor clutter or loose rugs. Poor lighting. Pets. Having dementia or memory loss. What actions can I take to lower my risk of falling?     Physical activity Stay physically fit. Do strength and balance exercises. Consider taking a regular class to build strength and balance. Yoga and tai chi are good options. Vision Have your eyes checked every year and your prescription for glasses or contacts updated as needed. Shoes and walking aids Wear non-skid shoes. Wear shoes that have rubber soles and low heels. Do not wear high heels. Do not walk around the house in socks or slippers. Use a cane or walker as told by your provider. Home safety Attach secure railings on both sides of your stairs. Install grab bars for your bathtub, shower, and toilet. Use a non-skid mat in your bathtub or shower. Attach bath mats securely with double-sided, non-slip rug tape. Use good lighting in all rooms. Keep a flashlight near your bed. Make sure there is a clear path from your bed to the bathroom. Use night-lights. Do not use throw rugs. Make sure all carpeting is taped or tacked down securely. Remove all clutter from walkways and  stairways, including extension cords. Repair uneven or broken steps and floors. Avoid walking on icy or slippery surfaces. Walk on the grass instead of on icy or slick sidewalks. Use ice melter to get rid of ice on walkways in the winter. Use a cordless phone. Questions to ask your health care provider Can you help me check my risk for a fall? Do any of my medicines make me more likely to fall? Should I take a vitamin D supplement? What exercises can I do to improve my strength and balance? Should I make an appointment to have my vision checked? Do I need a bone density test to check for weak bones (osteoporosis)? Would it help to use a cane or a walker? Where to find more information Centers for Disease Control and Prevention, [employer]: TonerPromos.no Community-Based Fall Prevention Programs: TonerPromos.no [employer] on Aging: BaseRingTones.pl Contact a health care provider if: You fall at home. You are afraid of falling at home. You feel weak, drowsy, or dizzy. This information is  not intended to replace advice given to you by your health care provider. Make sure you discuss any questions you have with your health care provider. Document Revised: 11/03/2021 Document Reviewed: 11/03/2021 Elsevier Patient Education  2024 [employer].

## 2022-12-15 NOTE — Progress Notes (Signed)
Because this visit was a virtual/telehealth visit,  certain criteria was not obtained, such a blood pressure, CBG if applicable, and timed get up and go. Any medications not marked as "taking" were not mentioned during the medication reconciliation part of the visit. Any vitals not documented were not able to be obtained due to this being a telehealth visit or patient was unable to self-report a recent blood pressure reading due to a lack of equipment at home via telehealth. Vitals that have been documented are verbally provided by the patient.   Subjective:   [name] is a 72 y.o. female who presents for Medicare Annual (Subsequent) preventive examination.  Visit Complete: Virtual  I connected with  Antony Odea on 12/15/22 by a audio enabled telemedicine application and verified that I am speaking with the correct person using two identifiers.  Patient Location: Home  Provider Location: Home Office  I discussed the limitations of evaluation and management by telemedicine. The patient expressed understanding and agreed to proceed.  Patient Medicare AWV questionnaire was completed by the patient on n/a; I have confirmed that all information answered by patient is correct and no changes since this date.  Cardiac Risk Factors include: advanced age (>39men, >67 women);diabetes mellitus;dyslipidemia;hypertension;obesity (BMI >30kg/m2);sedentary lifestyle;Other (see comment), Risk factor comments: history of stroke and 2 heart attacks     Objective:    Today's Vitals   12/15/22 1600 12/15/22 1617  Weight: 178 lb (80.7 kg)   Height: 5\' 3"  (1.6 m)   PainSc:  8    Body mass index is 31.53 kg/m.     12/15/2022    2:49 PM 11/23/2022    2:46 PM 09/01/2022   12:03 PM 08/27/2022    1:26 PM 05/26/2022    9:40 AM 04/28/2022    9:05 AM 02/25/2022   12:53 PM  Advanced Directives  Does Patient Have a Medical Advance Directive? No Yes No No No No No  Type of Advance Directive  Living  will       Does patient want to make changes to medical advance directive?  No - Patient declined       Would patient like information on creating a medical advance directive? No - Patient declined  No - Patient declined No - Patient declined No - Patient declined No - Patient declined No - Patient declined    Current Medications (verified) Outpatient Encounter Medications as of 12/15/2022  Medication Sig   albuterol (PROVENTIL HFA;VENTOLIN HFA) 108 (90 Base) MCG/ACT inhaler Inhale 2 puffs into the lungs every 4 (four) hours as needed for wheezing or shortness of breath.   albuterol (PROVENTIL) (2.5 MG/3ML) 0.083% nebulizer solution Take 2.5 mg by nebulization every 6 (six) hours as needed for wheezing or shortness of breath.   amLODipine (NORVASC) 5 MG tablet Take 1 tablet (5 mg total) by mouth daily.   aspirin EC 81 MG tablet Take 1 tablet (81 mg total) by mouth daily.   aspirin-acetaminophen-caffeine (EXCEDRIN MIGRAINE) 250-250-65 MG tablet Take 1 tablet by mouth daily as needed for headache.   azaTHIOprine (IMURAN) 50 MG tablet Take 1 tablet (50 mg total) by mouth daily.   carbamazepine (CARBATROL) 100 MG 12 hr capsule TAKE ONE CAPSULE BY MOUTH 2 TIMES A DAY   cetirizine (ZYRTEC) 10 MG tablet Take 10 mg by mouth daily.   Cholecalciferol (VITAMIN D3 SUPER STRENGTH) 50 MCG (2000 UT) CAPS Take 2,000 Units by mouth daily.   clopidogrel (PLAVIX) 75 MG tablet Take 1 tablet (  75 mg total) by mouth daily.   clorazepate (TRANXENE) 7.5 MG tablet Take 1 tablet (7.5 mg total) by mouth 2 (two) times daily as needed for anxiety.   cyclobenzaprine (FLEXERIL) 5 MG tablet Take 1 tablet (5 mg total) by mouth at bedtime as needed for muscle spasms.   famotidine (PEPCID) 20 MG tablet Take 1 tablet (20 mg total) by mouth at bedtime.   fluticasone (FLONASE) 50 MCG/ACT nasal spray Place 2 sprays into both nostrils as needed for allergies.   furosemide (LASIX) 40 MG tablet Take 1 tablet (40 mg total) by mouth daily  as needed for fluid (leg/hand swelling.).   HYDROcodone-acetaminophen (NORCO) 10-325 MG tablet Take 1 tablet by mouth every 4 (four) hours as needed for moderate pain.   isosorbide mononitrate (IMDUR) 120 MG 24 hr tablet Take 1 tablet (120 mg total) by mouth daily.   isosorbide mononitrate (IMDUR) 60 MG 24 hr tablet Take in addition to your 120 mg tablet for a total of 180 mg daily.   JARDIANCE 10 MG TABS tablet Take 10 mg by mouth every morning.   Ketotifen Fumarate (ALLERGY EYE DROPS OP) Place 1 drop into both eyes daily as needed (allergies).   levothyroxine (SYNTHROID, LEVOTHROID) 50 MCG tablet Take 50 mcg by mouth daily before breakfast.    LORazepam (ATIVAN) 0.5 MG tablet Take 1-2 pills 30 minutes prior to MRI   losartan (COZAAR) 25 MG tablet Take 1 tablet (25 mg total) by mouth daily.   nitroGLYCERIN (NITROSTAT) 0.4 MG SL tablet Place 0.4 mg under the tongue every 5 (five) minutes as needed for chest pain.     nortriptyline (PAMELOR) 10 MG capsule Take 1 capsule (10 mg total) by mouth at bedtime.   ondansetron (ZOFRAN) 4 MG tablet Take 1 tablet (4 mg total) by mouth 3 (three) times daily as needed.   pantoprazole (PROTONIX) 40 MG tablet TAKE 1 TABLET BY MOUTH ONCE A DAY.   potassium chloride SA (KLOR-CON M) 20 MEQ tablet Take 1 tablet (20 mEq total) by mouth daily as needed (Take only when you take Lasix).   rosuvastatin (CRESTOR) 40 MG tablet Take 1 tablet (40 mg total) by mouth daily.   sertraline (ZOLOFT) 25 MG tablet Take 0.5 tablets (12.5 mg total) by mouth daily.   tamsulosin (FLOMAX) 0.4 MG CAPS capsule Take 0.4 mg by mouth daily. For Kidney Stones   ursodiol (ACTIGALL) 500 MG tablet Take 1 tablet (500 mg total) by mouth 2 (two) times daily.   No facility-administered encounter medications on file as of 12/15/2022.    Allergies (verified) Gabapentin, Prednisone, Carvedilol, Clonidine derivatives, Diltiazem, Tape, and Latex   History: Past Medical History:  Diagnosis Date    Anxiety    Arthritis    "hands, arms, back, neck, knees, fingers" (01/21/2016)   Bradycardia    Breast cancer, left breast (HCC) 1970s; 2015   CAD (coronary artery disease)    a. s/p NSTEMI in 2017 with DES to OM and unsuccessful PCI to RCA with wire dissection b. DES to RCA in 2021   Cancer of skin of leg    BLE   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HCC)    left   Chronic lower back pain    Chronic pain    COPD (chronic obstructive pulmonary disease) (HCC)    Coronary artery disease    Depression    GERD (gastroesophageal reflux disease)    Heart murmur    Hepatic cirrhosis (HCC) 12/20/2017   Hepatitis  History of blood transfusion    "related to spleen"   History of hiatal hernia    History of kidney stones    Hyperlipidemia    Hypertension    Hypothyroidism    Kidney stone    Melanoma of forearm, left (HCC)    Migraine    "a few migraines/year" (01/21/2016)   Obesity    Stroke (HCC) 04/2003   2 carotid artery stents in place; small left parietal and left hemispheric CVA.[name] 01/21/2016   Tobacco use disorder    50 pack years; questionably discontinued in 2010   Wears glasses    Past Surgical History:  Procedure Laterality Date   ABDOMINAL HYSTERECTOMY  1978   APPENDECTOMY     BIOPSY  04/28/2022   Procedure: BIOPSY;  Surgeon: Marguerita Merles, Reuel Boom, MD;  [facility];  Service: Gastroenterology;;   BREAST BIOPSY Left 1970s; 2015   BREAST LUMPECTOMY Left 1970s   BREAST LUMPECTOMY WITH NEEDLE LOCALIZATION Left 07/07/2013   Procedure: BREAST LUMPECTOMY WITH NEEDLE LOCALIZATION;  Surgeon: Robyne Askew, MD;  Location: [facility];  Service: General;  Laterality: Left;   CARDIAC CATHETERIZATION N/A 01/22/2016   Procedure: Left Heart Cath and Coronary Angiography;  Surgeon: Peter M Swaziland, MD;  Location: [facility] INVASIVE CV LAB;  Service: Cardiovascular;  Laterality: N/A;   CARDIAC CATHETERIZATION N/A 01/22/2016   Procedure: Coronary Stent Intervention;   Surgeon: Peter M Swaziland, MD;  Location: [facility]AS[facility] CV LAB;  Service: Cardiovascular;  Laterality: N/A;   CAROTID STENT Left 04/2003   small left parietal and left hemispheric CVA/notes 07/29/2010   CHOLECYSTECTOMY OPEN     COLONOSCOPY  2011   COLONOSCOPY N/A 05/10/2019   Procedure: COLONOSCOPY;  Surgeon: Malissa Hippo, MD;  [facility];  Service: Endoscopy;  Laterality: N/A;  1:45   COLONOSCOPY WITH PROPOFOL N/A 09/01/2022   Procedure: COLONOSCOPY WITH PROPOFOL;  Surgeon: Dolores Frame, MD;  [facility];  Service: Gastroenterology;  Laterality: N/A;  1:45PM;ASA 3   CORONARY ATHERECTOMY N/A 12/29/2019   Procedure: CORONARY ATHERECTOMY;  Surgeon: Corky Crafts, MD;  Location: [facility] INVASIVE CV LAB;  Service: Cardiovascular;  Laterality: N/A;  Prox RCA   CORONARY STENT INTERVENTION N/A 12/29/2019   Procedure: CORONARY STENT INTERVENTION;  Surgeon: Corky Crafts, MD;  Location: [facility] INVASIVE CV LAB;  Service: Cardiovascular;  Laterality: N/A;  Prox RCA   CORONARY ULTRASOUND/IVUS N/A 12/29/2019   Procedure: Intravascular Ultrasound/IVUS;  Surgeon: Corky Crafts, MD;  Location: [facility] INVASIVE CV LAB;  Service: Cardiovascular;  Laterality: N/A;   ESOPHAGEAL DILATION N/A 04/28/2022   Procedure: ESOPHAGEAL DILATION;  Surgeon: Dolores Frame, MD;  [facility];  Service: Gastroenterology;  Laterality: N/A;   ESOPHAGOGASTRODUODENOSCOPY  08/06/2011   Procedure: ESOPHAGOGASTRODUODENOSCOPY (EGD);  Surgeon: Malissa Hippo, MD;  [facility];  Service: Endoscopy;  Laterality: N/A;   ESOPHAGOGASTRODUODENOSCOPY N/A 05/10/2019   Procedure: ESOPHAGOGASTRODUODENOSCOPY (EGD);  Surgeon: Malissa Hippo, MD;  [facility];  Service: Endoscopy;  Laterality: N/A;   ESOPHAGOGASTRODUODENOSCOPY (EGD) WITH PROPOFOL N/A 04/28/2022   Procedure: ESOPHAGOGASTRODUODENOSCOPY (EGD) WITH PROPOFOL;  Surgeon: Dolores Frame, MD;   [facility];  Service: Gastroenterology;  Laterality: N/A;  10:30 am, pt can't move up due to transportation   FLEXIBLE SIGMOIDOSCOPY  08/06/2011   Procedure: FLEXIBLE SIGMOIDOSCOPY;  Surgeon: Malissa Hippo, MD;  [facility];  Service: Endoscopy;  Laterality: N/A;   HEMOSTASIS CLIP  PLACEMENT  09/01/2022   Procedure: HEMOSTASIS CLIP PLACEMENT;  Surgeon: Dolores Frame, MD;  [facility];  Service: Gastroenterology;;   INGUINAL HERNIA REPAIR Left 1970s   IR ANGIO INTRA EXTRACRAN SEL COM CAROTID INNOMINATE BILAT MOD SED  11/01/2020   IR ANGIO VERTEBRAL SEL VERTEBRAL BILAT MOD SED  11/01/2020   IR RADIOLOGIST EVAL & MGMT  09/23/2020   IR US GUIDE VASC ACCESS RIGHT  11/01/2020   LARYNX SURGERY  1970s   Polyps excised   LEFT HEART CATH AND CORONARY ANGIOGRAPHY N/A 12/29/2019   Procedure: LEFT HEART CATH AND CORONARY ANGIOGRAPHY;  Surgeon: Corky Crafts, MD;  Location: [facility]VASIVE CV LAB;  Service: Cardiovascular;  Laterality: N/A;   LEFT HEART CATH AND CORONARY ANGIOGRAPHY N/A 10/12/2022   Procedure: LEFT HEART CATH AND CORONARY ANGIOGRAPHY;  Surgeon: Corky Crafts, MD;  Location: [facility] INVASIVE CV LAB;  Service: Cardiovascular;  Laterality: N/A;   MALONEY DILATION  05/10/2019   Procedure: [name] DILATION;  Surgeon: Malissa Hippo, MD;  [facility];  Service: Endoscopy;;   MELANOMA EXCISION Left ~ 2016   forearm   POLYPECTOMY  05/10/2019   Procedure: POLYPECTOMY;  Surgeon: Malissa Hippo, MD;  [facility];  Service: Endoscopy;;  colon   POLYPECTOMY  09/01/2022   Procedure: POLYPECTOMY INTESTINAL;  Surgeon: Dolores Frame, MD;  [facility];  Service: Gastroenterology;;   SPLENECTOMY, 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   spontaneous rupture   SUBMUCOSAL LIFTING INJECTION  09/01/2022   Procedure: SUBMUCOSAL LIFTING INJECTION;  Surgeon: Dolores Frame, MD;  [facility];  Service: Gastroenterology;;    SUBMUCOSAL TATTOO INJECTION  09/01/2022   Procedure: SUBMUCOSAL TATTOO INJECTION;  Surgeon: Dolores Frame, MD;  [facility];  Service: Gastroenterology;;   TEMPORARY PACEMAKER N/A 12/29/2019   Procedure: TEMPORARY PACEMAKER;  Surgeon: Corky Crafts, MD;  Location: [facility] INVASIVE CV LAB;  Service: Cardiovascular;  Laterality: N/A;   Family History  Problem Relation Age of Onset   Heart failure Mother    Cancer Father    Heart failure Father    Cancer Sister    Dementia Sister    Heart disease Other    Arthritis Other    Cancer Other    Diabetes Other    Kidney disease Other    Cancer Sister    Heart failure Brother    Social History   Socioeconomic History   Marital status: Divorced    Spouse name: Not on file   Number of children: 2   Years of education: Not on file   Highest education level: Not on file  Occupational History   Occupation: Disabled   Occupation: retired  Tobacco Use   Smoking status: Every Day    Current packs/day: 0.50    Average packs/day: 0.5 packs/day for 40.0 years (20.0 ttl pk-yrs)    Types: Cigarettes    Passive exposure: Current   Smokeless tobacco: Never  Vaping Use   Vaping status: Never Used  Substance and Sexual Activity   Alcohol use: Not Currently   Drug use: Never   Sexual activity: Not Currently  Other Topics Concern   Not on file  Social History Narrative   ** Merged History Encounter **       Right handed   Wears glasses    Drinks 1 cup of coffee daily   Drinks sweet tea twice a week.   Social Determinants of Health   Financial  Resource Strain: Low Risk  (12/15/2022)   Overall Financial Resource Strain (CARDIA)    Difficulty of Paying Living Expenses: Not hard at all  Food Insecurity: No Food Insecurity (12/15/2022)   Hunger Vital Sign    Worried About Running Out of Food in the Last Year: Never true    Ran Out of Food in the Last Year: Never true  Transportation Needs: No Transportation  Needs (12/15/2022)   PRAPARE - Administrator, Civil Service (Medical): No    Lack of Transportation (Non-Medical): No  Physical Activity: Sufficiently Active (12/15/2022)   Exercise Vital Sign    Days of Exercise per Week: 7 days    Minutes of Exercise per Session: 30 min  Stress: Stress Concern Present (12/15/2022)   [employer] of Occupational Health - Occupational Stress Questionnaire    Feeling of Stress : To some extent  Social Connections: Moderately Isolated (12/15/2022)   Social Connection and Isolation Panel [NHANES]    Frequency of Communication with Friends and Family: More than three times a week    Frequency of Social Gatherings with Friends and Family: More than three times a week    Attends Religious Services: More than 4 times per year    Active Member of [employer] or Organizations: No    Attends Engineer, structural: Never    Marital Status: Divorced    Tobacco Counseling Ready to quit: Yes Counseling given: Yes   Clinical Intake:  Pre-visit preparation completed: Yes  Pain : 0-10 Pain Score: 8  Pain Type: Chronic pain Pain Location: Back Pain Orientation: Lower Pain Descriptors / Indicators: Constant Pain Onset: More than a month ago Pain Frequency: Constant     BMI - recorded: 31.53 Nutritional Status: BMI > 30  Obese Nutritional Risks: None Diabetes: Yes CBG done?: No (telehealth visit) Did pt. bring in CBG monitor from home?: No  How often do you need to have someone help you when you read instructions, pamphlets, or other written materials from your doctor or pharmacy?: 1 - Never  Interpreter Needed?: No  Information entered by :: Abby Keats Kingry, CMA   Activities of Daily Living    12/15/2022    3:04 PM 08/27/2022    1:29 PM  In your present state of health, do you have any difficulty performing the following activities:  Hearing? 0   Vision? 0   Difficulty concentrating or making decisions? 0   Walking or climbing  stairs? 0   Dressing or bathing? 0   Doing errands, shopping? 1 0  Comment if the appt's are outside of [place] someone else drives her   Quarry manager and eating ? N   Using the Toilet? N   In the past six months, have you accidently leaked urine? N   Do you have problems with loss of bowel control? N   Managing your Medications? N   Managing your Finances? Y   Comment IT consultant or managing your Housekeeping? N     Patient Care Team: Billie Lade, MD as PCP - General (Internal Medicine) Wyline Mood Dorothe Pea, MD as PCP - Cardiology (Cardiology) Julieanne Cotton, MD (Radiology) Gareth Morgan, MD (Family Medicine) [facility], [facility]  Indicate any recent Medical Services you may have received from other than [facility] providers in the past year (date may be approximate).     Assessment:   This is a routine wellness examination for [place].  Hearing/Vision screen Hearing  Screening - Comments:: Patient denies any hearing difficulties.   Vision Screening - Comments:: Wears rx glasses - up to date with routine eye exams [facility]    Goals Addressed             This Visit's Progress    Patient Stated       To improve health and "get back to myself and feel normal". Lost son, dog, niece all within a few month of each other       Depression Screen    12/15/2022    2:52 PM 12/10/2022   12:29 PM 11/05/2022    2:02 PM 10/19/2022    9:58 AM  PHQ 2/9 Scores  PHQ - 2 Score 2  1 6   PHQ- 9 Score 6  4 16      Information is confidential and restricted. Go to Review Flowsheets to unlock data.    Fall Risk    12/15/2022    3:04 PM 11/05/2022    2:02 PM 10/19/2022    9:58 AM  Fall Risk   Falls in the past year? 0 0 0  Number falls in past yr: 0  0  Injury with Fall? 0  0  Risk for fall due to : No Fall Risks    Follow up Falls prevention discussed      MEDICARE RISK AT HOME: Medicare Risk at  Home Any stairs in or around the home?: No If so, are there any without handrails?: No Home free of loose throw rugs in walkways, pet beds, electrical cords, etc?: Yes Adequate lighting in your home to reduce risk of falls?: Yes Life alert?: No Use of a cane, walker or w/c?: No Grab bars in the bathroom?: Yes Shower chair or bench in shower?: Yes Elevated toilet seat or a handicapped toilet?: No  TIMED UP AND GO:  Was the test performed?  No    Cognitive Function:        12/15/2022    2:52 PM  6CIT Screen  What Year? 0 points  What month? 0 points  What time? 0 points  Count back from 20 0 points  Months in reverse 0 points  Repeat phrase 0 points  Total Score 0 points    Immunizations Immunization History  Administered Date(s) Administered   Influenza-Unspecified 01/15/2016   Moderna Sars-Covid-2 Vaccination 06/09/2019, 07/07/2019   Pneumococcal-Unspecified 01/15/2016    TDAP status: Due, Education has been provided regarding the importance of this vaccine. Advised may receive this vaccine at local pharmacy or Health Dept. Aware to provide a copy of the vaccination record if obtained from local pharmacy or Health Dept. Verbalized acceptance and understanding.  Flu Vaccine status: Due, Education has been provided regarding the importance of this vaccine. Advised may receive this vaccine at local pharmacy or Health Dept. Aware to provide a copy of the vaccination record if obtained from local pharmacy or Health Dept. Verbalized acceptance and understanding.  Pneumococcal vaccine status: Due, Education has been provided regarding the importance of this vaccine. Advised may receive this vaccine at local pharmacy or Health Dept. Aware to provide a copy of the vaccination record if obtained from local pharmacy or Health Dept. Verbalized acceptance and understanding.  Covid-19 vaccine status: Information provided on how to obtain vaccines.   Qualifies for Shingles Vaccine? Yes    Zostavax completed No   Shingrix Completed?: No.    Education has been provided regarding the importance of this vaccine. Patient has been advised to call  insurance company to determine out of pocket expense if they have not yet received this vaccine. Advised may also receive vaccine at local pharmacy or Health Dept. Verbalized acceptance and understanding.  Screening Tests Health Maintenance  Topic Date Due   Pneumonia Vaccine 42+ Years old (1 of 2 - PCV) 01/05/1957   FOOT EXAM  Never done   OPHTHALMOLOGY EXAM  Never done   DTaP/Tdap/Td (1 - Tdap) Never done   Zoster Vaccines- Shingrix (1 of 2) Never done   Lung Cancer Screening  Never done   COVID-19 Vaccine (3 - Moderna risk series) 08/04/2019   INFLUENZA VACCINE  10/15/2022   HEMOGLOBIN A1C  04/21/2023   DEXA SCAN  04/30/2023   Diabetic kidney evaluation - Urine ACR  10/19/2023   MAMMOGRAM  10/26/2023   Diabetic kidney evaluation - eGFR measurement  11/23/2023   Medicare Annual Wellness (AWV)  12/15/2023   Colonoscopy  08/31/2024   Hepatitis C Screening  Completed   HPV VACCINES  Aged Out    Health Maintenance  Health Maintenance Due  Topic Date Due   Pneumonia Vaccine 51+ Years old (1 of 2 - PCV) 01/05/1957   FOOT EXAM  Never done   OPHTHALMOLOGY EXAM  Never done   DTaP/Tdap/Td (1 - Tdap) Never done   Zoster Vaccines- Shingrix (1 of 2) Never done   Lung Cancer Screening  Never done   COVID-19 Vaccine (3 - Moderna risk series) 08/04/2019   INFLUENZA VACCINE  10/15/2022    Colorectal cancer screening: Type of screening: Colonoscopy. Completed 09/01/2022. Repeat every 2 years  Mammogram status: Completed 10/26/2022. Repeat every year  Bone Density status: Completed 04/29/2021. Results reflect: Bone density results: OSTEOPENIA. Repeat every 2 years.  Lung Cancer Screening: (Low Dose CT Chest recommended if Age 76-80 years, 20 pack-year currently smoking OR have quit w/in 15years.) does qualify.   Lung Cancer Screening  Referral: referral placed  Additional Screening:  Hepatitis C Screening: does not qualify; Completed 12/21/2017  Vision Screening: Recommended annual ophthalmology exams for early detection of glaucoma and other disorders of the eye. Is the patient up to date with their annual eye exam?  Yes  Who is the provider or what is the name of the office in which the patient attends annual eye exams? [facility] If pt is not established with a provider, would they like to be referred to a provider to establish care? No .   Dental Screening: Recommended annual dental exams for proper oral hygiene  Diabetic Foot Exam: Diabetic Foot Exam: Overdue, Pt has been advised about the importance in completing this exam. Pt is scheduled for diabetic foot exam on 01/21/2023.  Community Resource Referral / Chronic Care Management: CRR required this visit?  No   CCM required this visit?  No     Plan:     I have personally reviewed and noted the following in the patient's chart:   Medical and social history Use of alcohol, tobacco or illicit drugs  Current medications and supplements including opioid prescriptions. Patient is currently taking opioid prescriptions. Information provided to patient regarding non-opioid alternatives. Patient advised to discuss non-opioid treatment plan with their provider. Functional ability and status Nutritional status Physical activity Advanced directives List of other physicians Hospitalizations, surgeries, and ER visits in previous 12 months Vitals Screenings to include cognitive, depression, and falls Referrals and appointments  In addition, I have reviewed and discussed with patient certain preventive protocols, quality metrics, and best practice recommendations. A written  personalized care plan for preventive services as well as general preventive health recommendations were provided to patient.     Jordan Hawks Theodoros Stjames, CMA   12/15/2022   After Visit  Summary: (Mail) Due to this being a telephonic visit, the after visit summary with patients personalized plan was offered to patient via mail

## 2022-12-18 ENCOUNTER — Telehealth: Payer: Self-pay | Admitting: Internal Medicine

## 2022-12-18 NOTE — Telephone Encounter (Signed)
Pt called back in regard , wants a cll back

## 2022-12-18 NOTE — Telephone Encounter (Signed)
Patient called asked what is this medicine for she does not have seizures. carbamazepine (CARBATROL) 100 MG 12 hr capsule

## 2022-12-21 ENCOUNTER — Telehealth: Payer: Self-pay | Admitting: Internal Medicine

## 2022-12-21 ENCOUNTER — Other Ambulatory Visit: Payer: Self-pay | Admitting: Internal Medicine

## 2022-12-21 NOTE — Telephone Encounter (Signed)
Pt. Called in with question about recent medication prescribed. (Carbamazepine) She is not sure why she was prescribed the medication and is asking if it is related to Gabapentin as she is very allergic.

## 2022-12-21 NOTE — Telephone Encounter (Signed)
Called pt. Had previously obtained a message to lvm. Gave physicians instructions on vm.

## 2022-12-22 NOTE — Telephone Encounter (Signed)
Patient advised.

## 2022-12-23 ENCOUNTER — Ambulatory Visit (HOSPITAL_COMMUNITY)
Admission: RE | Admit: 2022-12-23 | Discharge: 2022-12-23 | Disposition: A | Discharge status: Home / Self Care | Payer: 59 | Source: Ambulatory Visit | Attending: Psychiatry | Admitting: Psychiatry

## 2022-12-23 ENCOUNTER — Ambulatory Visit (HOSPITAL_COMMUNITY)
Admission: RE | Admit: 2022-12-23 | Discharge: 2022-12-23 | Disposition: A | Discharge status: Home / Self Care | Payer: 59 | Source: Ambulatory Visit | Attending: Psychiatry

## 2022-12-23 DIAGNOSIS — M26622 Arthralgia of left temporomandibular joint: Secondary | ICD-10-CM

## 2022-12-23 DIAGNOSIS — M2669 Other specified disorders of temporomandibular joint: Secondary | ICD-10-CM | POA: Diagnosis not present

## 2022-12-23 DIAGNOSIS — R29818 Other symptoms and signs involving the nervous system: Secondary | ICD-10-CM | POA: Diagnosis not present

## 2022-12-23 DIAGNOSIS — R531 Weakness: Secondary | ICD-10-CM | POA: Insufficient documentation

## 2022-12-23 DIAGNOSIS — Z9689 Presence of other specified functional implants: Secondary | ICD-10-CM | POA: Insufficient documentation

## 2022-12-23 MED ORDER — GADOBUTROL 1 MMOL/ML IV SOLN
8.0000 mL | Freq: Once | INTRAVENOUS | Status: AC | PRN
  Administered 2022-12-23: 8 mL via INTRAVENOUS

## 2022-12-28 ENCOUNTER — Telehealth: Payer: Self-pay | Admitting: Internal Medicine

## 2022-12-28 DIAGNOSIS — G894 Chronic pain syndrome: Secondary | ICD-10-CM

## 2022-12-28 MED ORDER — HYDROCODONE-ACETAMINOPHEN 10-325 MG PO TABS: 1.0000 | ORAL_TABLET | Freq: Four times a day (QID) | ORAL | 0 refills | Status: DC | PRN

## 2022-12-28 NOTE — Telephone Encounter (Signed)
Patient is requesting refill on hydrocodone send to [facility]

## 2022-12-29 ENCOUNTER — Encounter: Payer: Self-pay | Admitting: Neurology

## 2022-12-29 NOTE — Telephone Encounter (Signed)
Patient advised.

## 2022-12-30 ENCOUNTER — Ambulatory Visit (HOSPITAL_COMMUNITY): Payer: 59 | Attending: Internal Medicine

## 2022-12-30 DIAGNOSIS — R202 Paresthesia of skin: Secondary | ICD-10-CM | POA: Diagnosis present

## 2022-12-30 DIAGNOSIS — I8511 Secondary esophageal varices with bleeding: Secondary | ICD-10-CM | POA: Insufficient documentation

## 2022-12-30 DIAGNOSIS — M26622 Arthralgia of left temporomandibular joint: Secondary | ICD-10-CM | POA: Insufficient documentation

## 2022-12-30 DIAGNOSIS — K743 Primary biliary cirrhosis: Secondary | ICD-10-CM | POA: Insufficient documentation

## 2022-12-30 DIAGNOSIS — R1312 Dysphagia, oropharyngeal phase: Secondary | ICD-10-CM | POA: Insufficient documentation

## 2022-12-30 NOTE — Therapy (Signed)
OUTPATIENT PHYSICAL THERAPY EVALUATION   Patient Name: [name] MRN: [id number] DOB:[birth year], 72 y.o., female Today's Date: 12/30/2022  END OF SESSION:  PT End of Session - 12/30/22 1332     Visit Number 1    Number of Visits 6    Date for PT Re-Evaluation 02/10/23    Authorization Type [employer] Dual Complete    Authorization Time Period 12/30/22-02/10/23    Progress Note Due on Visit 6    PT Start Time 1100    PT Stop Time 1140    PT Time Calculation (min) 40 min    Activity Tolerance Patient tolerated treatment well;Patient limited by pain    Behavior During Therapy [facility] for tasks assessed/performed             Past Medical History:  Diagnosis Date   Anxiety    Arthritis    "hands, arms, back, neck, knees, fingers" (01/21/2016)   Bradycardia    Breast cancer, left breast (HCC) 1970s; 2015   CAD (coronary artery disease)    a. s/p NSTEMI in 2017 with DES to OM and unsuccessful PCI to RCA with wire dissection b. DES to RCA in 2021   Cancer of skin of leg    BLE   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HCC)    left   Chronic lower back pain    Chronic pain    COPD (chronic obstructive pulmonary disease) (HCC)    Coronary artery disease    Depression    GERD (gastroesophageal reflux disease)    Heart murmur    Hepatic cirrhosis (HCC) 12/20/2017   Hepatitis    History of blood transfusion    "related to spleen"   History of hiatal hernia    History of kidney stones    Hyperlipidemia    Hypertension    Hypothyroidism    Kidney stone    Melanoma of forearm, left (HCC)    Migraine    "a few migraines/year" (01/21/2016)   Obesity    Stroke (HCC) 04/2003   2 carotid artery stents in place; small left parietal and left hemispheric CVA.[name] 01/21/2016   Tobacco use disorder    50 pack years; questionably discontinued in 2010   Wears glasses    Past Surgical History:  Procedure Laterality Date   ABDOMINAL HYSTERECTOMY  1978   APPENDECTOMY     BIOPSY  04/28/2022    Procedure: BIOPSY;  Surgeon: Marguerita Merles, Reuel Boom, MD;  [facility];  Service: Gastroenterology;;   BREAST BIOPSY Left 1970s; 2015   BREAST LUMPECTOMY Left 1970s   BREAST LUMPECTOMY WITH NEEDLE LOCALIZATION Left 07/07/2013   Procedure: BREAST LUMPECTOMY WITH NEEDLE LOCALIZATION;  Surgeon: Robyne Askew, MD;  Location:  SURGERY CENTER;  Service: General;  Laterality: Left;   CARDIAC CATHETERIZATION N/A 01/22/2016   Procedure: Left Heart Cath and Coronary Angiography;  Surgeon: Peter M Swaziland, MD;  Location: [facility] INVASIVE CV LAB;  Service: Cardiovascular;  Laterality: N/A;   CARDIAC CATHETERIZATION N/A 01/22/2016   Procedure: Coronary Stent Intervention;  Surgeon: Peter M Swaziland, MD;  Location: [facility] INVASIVE CV LAB;  Service: Cardiovascular;  Laterality: N/A;   CAROTID STENT Left 04/2003   small left parietal and left hemispheric CVA/notes 07/29/2010   CHOLECYSTECTOMY OPEN     COLONOSCOPY  2011   COLONOSCOPY N/A 05/10/2019   Procedure: COLONOSCOPY;  Surgeon: Malissa Hippo, MD;  [facility];  Service: Endoscopy;  Laterality: N/A;  1:45   COLONOSCOPY WITH PROPOFOL N/A  09/01/2022   Procedure: COLONOSCOPY WITH PROPOFOL;  Surgeon: Dolores Frame, MD;  [facility];  Service: Gastroenterology;  Laterality: N/A;  1:45PM;ASA 3   CORONARY ATHERECTOMY N/A 12/29/2019   Procedure: CORONARY ATHERECTOMY;  Surgeon: Corky Crafts, MD;  Location: [facility] INVASIVE CV LAB;  Service: Cardiovascular;  Laterality: N/A;  Prox RCA   CORONARY STENT INTERVENTION N/A 12/29/2019   Procedure: CORONARY STENT INTERVENTION;  Surgeon: Corky Crafts, MD;  Location: [facility] INVASIVE CV LAB;  Service: Cardiovascular;  Laterality: N/A;  Prox RCA   CORONARY ULTRASOUND/IVUS N/A 12/29/2019   Procedure: Intravascular Ultrasound/IVUS;  Surgeon: Corky Crafts, MD;  Location: [facility] INVASIVE CV LAB;  Service: Cardiovascular;  Laterality: N/A;   ESOPHAGEAL DILATION N/A  04/28/2022   Procedure: ESOPHAGEAL DILATION;  Surgeon: Dolores Frame, MD;  [facility];  Service: Gastroenterology;  Laterality: N/A;   ESOPHAGOGASTRODUODENOSCOPY  08/06/2011   Procedure: ESOPHAGOGASTRODUODENOSCOPY (EGD);  Surgeon: Malissa Hippo, MD;  [facility];  Service: Endoscopy;  Laterality: N/A;   ESOPHAGOGASTRODUODENOSCOPY N/A 05/10/2019   Procedure: ESOPHAGOGASTRODUODENOSCOPY (EGD);  Surgeon: Malissa Hippo, MD;  [facility];  Service: Endoscopy;  Laterality: N/A;   ESOPHAGOGASTRODUODENOSCOPY (EGD) WITH PROPOFOL N/A 04/28/2022   Procedure: ESOPHAGOGASTRODUODENOSCOPY (EGD) WITH PROPOFOL;  Surgeon: Dolores Frame, MD;  [facility];  Service: Gastroenterology;  Laterality: N/A;  10:30 am, pt can't move up due to transportation   FLEXIBLE SIGMOIDOSCOPY  08/06/2011   Procedure: FLEXIBLE SIGMOIDOSCOPY;  Surgeon: Malissa Hippo, MD;  [facility];  Service: Endoscopy;  Laterality: N/A;   HEMOSTASIS CLIP PLACEMENT  09/01/2022   Procedure: HEMOSTASIS CLIP PLACEMENT;  Surgeon: Dolores Frame, MD;  [facility];  Service: Gastroenterology;;   INGUINAL HERNIA REPAIR Left 1970s   IR ANGIO INTRA EXTRACRAN SEL COM CAROTID INNOMINATE BILAT MOD SED  11/01/2020   IR ANGIO VERTEBRAL SEL VERTEBRAL BILAT MOD SED  11/01/2020   IR RADIOLOGIST EVAL & MGMT  09/23/2020   IR US GUIDE VASC ACCESS RIGHT  11/01/2020   LARYNX SURGERY  1970s   Polyps excised   LEFT HEART CATH AND CORONARY ANGIOGRAPHY N/A 12/29/2019   Procedure: LEFT HEART CATH AND CORONARY ANGIOGRAPHY;  Surgeon: Corky Crafts, MD;  Location: [facility] INVASIVE CV LAB;  Service: Cardiovascular;  Laterality: N/A;   LEFT HEART CATH AND CORONARY ANGIOGRAPHY N/A 10/12/2022   Procedure: LEFT HEART CATH AND CORONARY ANGIOGRAPHY;  Surgeon: Corky Crafts, MD;  Location: [facility] INVASIVE CV LAB;  Service: Cardiovascular;  Laterality: N/A;   MALONEY DILATION   05/10/2019   Procedure: [name] DILATION;  Surgeon: Malissa Hippo, MD;  [facility];  Service: Endoscopy;;   MELANOMA EXCISION Left ~ 2016   forearm   POLYPECTOMY  05/10/2019   Procedure: POLYPECTOMY;  Surgeon: Malissa Hippo, MD;  [facility];  Service: Endoscopy;;  colon   POLYPECTOMY  09/01/2022   Procedure: POLYPECTOMY INTESTINAL;  Surgeon: Dolores Frame, MD;  [facility];  Service: Gastroenterology;;   SPLENECTOMY, 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   spontaneous rupture   SUBMUCOSAL LIFTING INJECTION  09/01/2022   Procedure: SUBMUCOSAL LIFTING INJECTION;  Surgeon: Dolores Frame, MD;  [facility];  Service: Gastroenterology;;   SUBMUCOSAL TATTOO INJECTION  09/01/2022   Procedure: SUBMUCOSAL TATTOO INJECTION;  Surgeon: Dolores Frame, MD;  [facility];  Service: Gastroenterology;;   TEMPORARY PACEMAKER N/A 12/29/2019   Procedure: TEMPORARY PACEMAKER;  Surgeon: Corky Crafts, MD;  Location: [facility] INVASIVE CV LAB;  Service: Cardiovascular;  Laterality: N/A;     PCP: Christel Mormon, MD  REFERRING PROVIDER: Nita Sells, MD   REFERRING DIAG: Left temporomandibular joint dysfunction  THERAPY DIAG:  Arthralgia of left temporomandibular joint  Paresthesia of skin  Rationale for Evaluation and Treatment: Rehabilitation  ONSET DATE: Early September 2024 for rapid onset left lower lateral facial numbness; Left TMJ clicking/pain ~5-6 months ago (April 2024)   SUBJECTIVE:                                                                                                                                                                                                         SUBJECTIVE STATEMENT: Pt referred here after ED visit wth acute left sided facial numbness and TMJ pain.   PERTINENT HISTORY:  Ugonna Storti is a 71yoF who is referred to OPPT with CC left TMJ clicking/pain has been ongoing and stable for about  5-6 months, but then early September had a rapid progression in numbness about the ipsilateral face below the cheek bone, along the jaw line. Pt went to ED in fear of a CVA (prior history). CVA ruled out at that time, DC to home. Additional MRI orders post DC for TMJ specifically, results pending as of 10/16. Pt reports clicking to be consistent, but not locking, not restricting any specific foods, only aggravated with repitition required for eating. Pt reports her numbness of face has a rapid progressive onset rather than sudden acute onset, has not changed much since onset. ED doc also recommending self referral to dentistry for evaluation.   PAIN:  Are you having pain? Yes 9/10   PRECAUTIONS: None  WEIGHT BEARING RESTRICTIONS: None  FALLS:  Has patient fallen in last 6 months? No  PATIENT GOALS: resolve pain, clicking, find out why her face is numb   NEXT MD VISIT: unknown, asked to set up a dentistry appointment, but recommended she wait until MRI results available.   OBJECTIVE:  Note: Objective measures were completed at Evaluation unless otherwise noted.  DIAGNOSTIC FINDINGS:    Has a trigeminal MRI and brain MRI done, read is still pending.   Head CT on 11/23/22 "FINDINGS: Brain: Normal anatomic configuration. No abnormal intra or extra-axial mass lesion or fluid collection. No abnormal mass effect or midline shift. No evidence of acute intracranial hemorrhage or infarct. Ventricular size is normal. Cerebellum unremarkable.  Vascular: Unremarkable  Skull: Intact  Sinuses/Orbits: Paranasal sinuses are clear. Orbits are unremarkable.  Other: Mastoid air cells and middle ear  cavities are clear.  IMPRESSION: 1. No acute intracranial hemorrhage or infarct.   Electronically Signed   By: Helyn Numbers M.D.   On: 11/23/2022 17:58"  PATIENT SURVEYS:  None  COGNITION: Overall cognitive status: Within functional limits for tasks assessed  SENSATION: Light touch: Impaired  Left lateral inferior face along the angle of the mandible, stocking paresthesia, no sensory changes above the cheek bone, ear light touch sensation intact, still feels pressure, touch senation/proprio intact.  POSTURE: forward head From GHJ, Left ear canal 35 degrees  PALPATION: Normal tone, tenderness on Right side; left side feels some what edematous, allodynia, lumpy, does not tolerate palpation enough to assess trigger points specifically. Also tender/painful in left medial pterygoid.    CERVICAL ROM:   Active ROM A/PROM (deg) eval  Flexion 35 degrees  Right rotation 60 degrees  Left rotation 45 degrees  Jaw opening  40mm  Rt lateral jaw glide  5mm  Lt lateral jaw glide  0mm  Protrusion  5mm   1. Jaw opening is devoid of secondary protrusion movement   TODAY'S TREATMENT:                                                                                                                              DATE:   12/31/22 -Rt lateral glide grade 4 mobilization 1x30sec, then pt performs on self as directed for HEP education -seated jaw protrusion A/ROM x10 for HEP education  -seated cervical rotation x5 bilat -seated cervical extension x5       PATIENT EDUCATION:  Education details:  HEP and what PT can and cannot help with; recs to get scheduled with a dentist  Person educated: Patient and DSS rep Education method: explanation, expressive gesturing Education comprehension: affirms understanding   HOME EXERCISE PROGRAM: Access Code: [email or web address] URL: [email or web address] Date: 12/31/2022 Prepared by: Alvera Novel  Exercises - Jaw Glide Right  - 3 x daily - 1 sets - 15 reps - Jaw Protraction  - 3 x daily - 1 sets - 15 reps - Seated Cervical Rotation AROM  - 3 x daily - 1 sets - 15 reps - Seated Cervical Retraction and Extension  - 3 x daily - 1 sets - 15 reps  ASSESSMENT:  CLINICAL IMPRESSION:  Pt referred to OPPT due to ongoing left TMJ clicking and pain,  new left facial paresthesia of unknown etiology. Pt examination revealing of allodynia, boggy left masseter, tender TMJ to palpation, intermittent clicking with attempts of end-range opening of mouth, reduced left TMJ arthrokinematics that is restricting Left sided jaw protrusion at end-phase opening, can be seen with lack of Rt lateral glide. Pt educated on need to schedule with dentistry. 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 Pt encouraged to avoid unnecessary provocation of jaw movements, avoid smoking, gum chewing, and transition some calories to soft or liquid foods  as needed. Explained that PT is iunlikely to change clicking phenomenon, but that these typically resolve on their own over 1-2 years. PT may be helpful in reducing myofacials pain in left jaw muscles. will help pt achieve LT goals of care, reduce pain, return to baseline.      OBJECTIVE IMPAIRMENTS: increased edema, increased fascial restrictions, and increased muscle spasms.   ACTIVITY LIMITATIONS: self feeding  PARTICIPATION LIMITATIONS: medication management  PERSONAL FACTORS: Age, Education, and Time since onset of injury/illness/exacerbation are also affecting patient's functional outcome.   REHAB POTENTIAL: Fair  CLINICAL DECISION MAKING: Evolving/moderate complexity  EVALUATION COMPLEXITY: Moderate   GOALS: Goals reviewed with patient? Yes  SHORT TERM GOALS: Target date: 01/30/23  Pt to report reduced worst pain in left jaw by >2/10 points on NPRS.  Baseline: 10/10 Goal status: INITIAL  2.  Pt to report improved pleasure in eating her meals and reduced anxiety regarding triggering of symptoms by 50%.  Baseline:  Goal status: INITIAL  3.  Pt to show jaw opening of >61mm Baseline: 40mm without any protrusion  Goal status: INITIAL  4.  Pt to show >82mm lateral jaw  glide bilaterally to imply improved Left TMJ arthrokinematics for opening.   Baseline: 0 degrees Rt glide  Goal status: INITIAL   LONG TERM GOALS: Target date: 02/13/23  Pt to report no greater than 4/10 pain within given week and 2-3 ways to manage pain flares at home.  Baseline:  Goal status: INITIAL    PLAN:  PT FREQUENCY: 2x/week  PT DURATION: 6 weeks  PLANNED INTERVENTIONS: 97110-Therapeutic exercises, 97530- Therapeutic activity, [id number]- Neuromuscular re-education, 97535- Self Care, and [id number]- Manual therapy  PLAN FOR NEXT SESSION: Review HEP, review MRI findings if relevant, FU on dentist appointment   Jamyla Ard C, PT 12/30/2022, 1:35 PM  Rosamaria Lints, PT Physical Therapist [name] [place]  [phone]

## 2023-01-01 ENCOUNTER — Encounter: Payer: Self-pay | Admitting: Physical Medicine & Rehabilitation

## 2023-01-01 ENCOUNTER — Encounter: Payer: 59 | Attending: Physical Medicine & Rehabilitation | Admitting: Physical Medicine & Rehabilitation

## 2023-01-01 VITALS — BP 122/81 | HR 71 | Ht 63.0 in | Wt 183.0 lb

## 2023-01-01 DIAGNOSIS — Z79891 Long term (current) use of opiate analgesic: Secondary | ICD-10-CM | POA: Diagnosis present

## 2023-01-01 DIAGNOSIS — M542 Cervicalgia: Secondary | ICD-10-CM

## 2023-01-01 DIAGNOSIS — G8929 Other chronic pain: Secondary | ICD-10-CM

## 2023-01-01 DIAGNOSIS — Z5181 Encounter for therapeutic drug level monitoring: Secondary | ICD-10-CM

## 2023-01-01 DIAGNOSIS — G894 Chronic pain syndrome: Secondary | ICD-10-CM | POA: Diagnosis present

## 2023-01-01 DIAGNOSIS — M79645 Pain in left finger(s): Secondary | ICD-10-CM | POA: Diagnosis present

## 2023-01-01 DIAGNOSIS — M79644 Pain in right finger(s): Secondary | ICD-10-CM | POA: Diagnosis not present

## 2023-01-01 MED ORDER — NORTRIPTYLINE HCL 25 MG PO CAPS: 25.0000 mg | ORAL_CAPSULE | Freq: Every day | ORAL | 3 refills | Status: DC

## 2023-01-01 NOTE — Progress Notes (Addendum)
Subjective:    Patient ID: [name], female    DOB: 1950-06-10, 72 y.o.   MRN: [id number]  HPI    HPI  [name] is a 72 y.o. year old female  who  has a past medical history of Anxiety, Arthritis, Bradycardia, Breast cancer, left breast (HCC) (1970s; 2015), CAD (coronary artery disease), Cancer of skin of leg, 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HCC), Chronic lower back pain, Chronic pain, COPD (chronic obstructive pulmonary disease) (HCC), Coronary artery disease, Depression, GERD (gastroesophageal reflux disease), Heart murmur, Hepatic cirrhosis (HCC) (12/20/2017), Hepatitis, History of blood transfusion, History of hiatal hernia, History of kidney stones, Hyperlipidemia, Hypertension, Hypothyroidism, Kidney stone, Melanoma of forearm, left (HCC), Migraine, Obesity, Stroke (HCC) (04/2003), Tobacco use disorder, and Wears glasses.   They are presenting to PM&R clinic as a new patient for pain management evaluation. They were referred by Dr. Durwin Nora for treatment of chronic pain.  Patient reports she has had pain in multiple areas for many years.  Reports she had pain in her neck due to arthritis for many years.  Reports she had previous injections in her neck ESI?  X 3 about 10 years ago that did not significantly reduce her overall pain but did help the burning pain she was having in the area.  She said she was seeing a "bone doctor" and Dr. Romeo Apple.  She reports she previously had cortisone injections in her shoulders more on the left side.  Shoulder pain is doing better recently.  She reports severe swelling and pain around her first [facility] joint.  She also has pain throughout other digits of her bilateral hands due to arthritis.  Additionally she has pain in her knees primarily on the left side.  This pain is worse with ambulation.  For several months she has been having pain around her left jaw.  Reports some shock type pain over her left face.  She has a lot of popping when she opens and  closes her jaw.  Recently had MRI of her face ordered by neurology, results pending.  She also had an MRI of her brain with results pending by neurology due to left-sided weakness rule out additional CVA.  He has a history of depression and anxiety.  Denies HI or SI.  Reports that she lost her son, dog and niece about 1 and half years ago.  Red flag symptoms: No red flags for back pain endorsed in Hx or ROS  Medications tried: Topical medications - denies  Nsaids - Excedrin used in the past Tylenol  Helps for a short time  Opiates   Hydrocodone 10mg  for [employer], retired in July , now Dr. Durwin Nora Gabapentin- Allergy to this medication TCAs  nortriptyline - helping jaw pain  SNRIs  Duloxetine?- not sure    Other treatments: PT-  PT for TMJ jaw, years ago it made the pain worse   TENs unit - denies  Injections- Shoulder injections Surgery - denies  Other  heating pad helps  Prior UDS results:     Component Value Date/Time   LABOPIA POSITIVE (A) 07/01/2021 1000   COCAINSCRNUR NONE DETECTED 07/01/2021 1000   LABBENZ POSITIVE (A) 07/01/2021 1000   AMPHETMU NONE DETECTED 07/01/2021 1000   THCU NONE DETECTED 07/01/2021 1000   LABBARB NONE DETECTED 07/01/2021 1000      Pain Inventory Average Pain 10 Pain Right Now 9 My pain is constant, sharp, and stabbing  In the last 24 hours, has pain interfered with the following?  General activity 9 Relation with others 8 Enjoyment of life 10 What TIME of day is your pain at its worst? morning  and night Sleep (in general) Poor  Pain is worse with: sitting and some activites Pain improves with: rest, heat/ice, and medication Relief from Meds: 10  walk without assistance ability to climb steps?  yes do you drive?  yes Do you have any goals in this area?  yes  retired  weakness numbness tingling depression anxiety  Any changes since last visit?  no  Any changes since last visit?  no    Family History  Problem  Relation Age of Onset   Heart failure Mother    Cancer Father    Heart failure Father    Cancer Sister    Dementia Sister    Heart disease Other    Arthritis Other    Cancer Other    Diabetes Other    Kidney disease Other    Cancer Sister    Heart failure Brother    Social History   Socioeconomic History   Marital status: Divorced    Spouse name: Not on file   Number of children: 2   Years of education: Not on file   Highest education level: Not on file  Occupational History   Occupation: Disabled   Occupation: retired  Tobacco Use   Smoking status: Every Day    Current packs/day: 0.50    Average packs/day: 0.5 packs/day for 40.0 years (20.0 ttl pk-yrs)    Types: Cigarettes    Passive exposure: Current   Smokeless tobacco: Never  Vaping Use   Vaping status: Never Used  Substance and Sexual Activity   Alcohol use: Not Currently   Drug use: Never   Sexual activity: Not Currently  Other Topics Concern   Not on file  Social History Narrative   ** Merged History Encounter **       Right handed   Wears glasses    Drinks 1 cup of coffee daily   Drinks sweet tea twice a week.   Social Determinants of Health   Financial Resource Strain: Low Risk  (12/15/2022)   Overall Financial Resource Strain (CARDIA)    Difficulty of Paying Living Expenses: Not hard at all  Food Insecurity: No Food Insecurity (12/15/2022)   Hunger Vital Sign    Worried About Running Out of Food in the Last Year: Never true    Ran Out of Food in the Last Year: Never true  Transportation Needs: No Transportation Needs (12/15/2022)   PRAPARE - Administrator, Civil Service (Medical): No    Lack of Transportation (Non-Medical): No  Physical Activity: Sufficiently Active (12/15/2022)   Exercise Vital Sign    Days of Exercise per Week: 7 days    Minutes of Exercise per Session: 30 min  Stress: Stress Concern Present (12/15/2022)   [employer] of Occupational Health - Occupational  Stress Questionnaire    Feeling of Stress : To some extent  Social Connections: Moderately Isolated (12/15/2022)   Social Connection and Isolation Panel [NHANES]    Frequency of Communication with Friends and Family: More than three times a week    Frequency of Social Gatherings with Friends and Family: More than three times a week    Attends Religious Services: More than 4 times per year    Active Member of [employer] or Organizations: No    Attends Banker Meetings: Never    Marital Status: Divorced  Past Surgical History:  Procedure Laterality Date   ABDOMINAL HYSTERECTOMY  1978   APPENDECTOMY     BIOPSY  04/28/2022   Procedure: BIOPSY;  Surgeon: Marguerita Merles, Reuel Boom, MD;  [facility];  Service: Gastroenterology;;   BREAST BIOPSY Left 1970s; 2015   BREAST LUMPECTOMY Left 1970s   BREAST LUMPECTOMY WITH NEEDLE LOCALIZATION Left 07/07/2013   Procedure: BREAST LUMPECTOMY WITH NEEDLE LOCALIZATION;  Surgeon: Robyne Askew, MD;  Location: [facility];  Service: General;  Laterality: Left;   CARDIAC CATHETERIZATION N/A 01/22/2016   Procedure: Left Heart Cath and Coronary Angiography;  Surgeon: Peter M Swaziland, MD;  Location: [facility] INVASIVE CV LAB;  Service: Cardiovascular;  Laterality: N/A;   CARDIAC CATHETERIZATION N/A 01/22/2016   Procedure: Coronary Stent Intervention;  Surgeon: Peter M Swaziland, MD;  Location: [facility] INVASIVE CV LAB;  Service: Cardiovascular;  Laterality: N/A;   CAROTID STENT Left 04/2003   small left parietal and left hemispheric CVA/notes 07/29/2010   CHOLECYSTECTOMY OPEN     COLONOSCOPY  2011   COLONOSCOPY N/A 05/10/2019   Procedure: COLONOSCOPY;  Surgeon: Malissa Hippo, MD;  [facility]: [facility];  Service: Endoscopy;  Laterality: N/A;  1:45   COLONOSCOPY WITH PROPOFOL N/A 09/01/2022   Procedure: COLONOSCOPY WITH PROPOFOL;  Surgeon: Dolores Frame, MD;  [facility];  Service: Gastroenterology;  Laterality: N/A;   1:45PM;ASA 3   CORONARY ATHERECTOMY N/A 12/29/2019   Procedure: CORONARY ATHERECTOMY;  Surgeon: Corky Crafts, MD;  Location: [facility] INVASIVE CV LAB;  Service: Cardiovascular;  Laterality: N/A;  Prox RCA   CORONARY STENT INTERVENTION N/A 12/29/2019   Procedure: CORONARY STENT INTERVENTION;  Surgeon: Corky Crafts, MD;  Location: [facility] INVASIVE CV LAB;  Service: Cardiovascular;  Laterality: N/A;  Prox RCA   CORONARY ULTRASOUND/IVUS N/A 12/29/2019   Procedure: Intravascular Ultrasound/IVUS;  Surgeon: Corky Crafts, MD;  Location: [facility] INVASIVE CV LAB;  Service: Cardiovascular;  Laterality: N/A;   ESOPHAGEAL DILATION N/A 04/28/2022   Procedure: ESOPHAGEAL DILATION;  Surgeon: Dolores Frame, MD;  [facility];  Service: Gastroenterology;  Laterality: N/A;   ESOPHAGOGASTRODUODENOSCOPY  08/06/2011   Procedure: ESOPHAGOGASTRODUODENOSCOPY (EGD);  Surgeon: Malissa Hippo, MD;  [facility];  Service: Endoscopy;  Laterality: N/A;   ESOPHAGOGASTRODUODENOSCOPY N/A 05/10/2019   Procedure: ESOPHAGOGASTRODUODENOSCOPY (EGD);  Surgeon: Malissa Hippo, MD;  [facility];  Service: Endoscopy;  Laterality: N/A;   ESOPHAGOGASTRODUODENOSCOPY (EGD) WITH PROPOFOL N/A 04/28/2022   Procedure: ESOPHAGOGASTRODUODENOSCOPY (EGD) WITH PROPOFOL;  Surgeon: Dolores Frame, MD;  [facility];  Service: Gastroenterology;  Laterality: N/A;  10:30 am, pt can't move up due to transportation   FLEXIBLE SIGMOIDOSCOPY  08/06/2011   Procedure: FLEXIBLE SIGMOIDOSCOPY;  Surgeon: Malissa Hippo, MD;  [facility];  Service: Endoscopy;  Laterality: N/A;   HEMOSTASIS CLIP PLACEMENT  09/01/2022   Procedure: HEMOSTASIS CLIP PLACEMENT;  Surgeon: Dolores Frame, MD;  [facility];  Service: Gastroenterology;;   INGUINAL HERNIA REPAIR Left 1970s   IR ANGIO INTRA EXTRACRAN SEL COM CAROTID INNOMINATE BILAT MOD SED  11/01/2020   IR ANGIO  VERTEBRAL SEL VERTEBRAL BILAT MOD SED  11/01/2020   IR RADIOLOGIST EVAL & MGMT  09/23/2020   IR US GUIDE VASC ACCESS RIGHT  11/01/2020   LARYNX SURGERY  1970s   Polyps excised   LEFT HEART CATH AND CORONARY ANGIOGRAPHY N/A 12/29/2019   Procedure: LEFT HEART CATH AND CORONARY ANGIOGRAPHY;  Surgeon: Corky Crafts, MD;  Location: [facility] INVASIVE CV LAB;  Service: Cardiovascular;  Laterality: N/A;   LEFT HEART CATH AND CORONARY ANGIOGRAPHY N/A 10/12/2022   Procedure: LEFT HEART CATH AND CORONARY ANGIOGRAPHY;  Surgeon: Corky Crafts, MD;  Location: [facility] INVASIVE CV LAB;  Service: Cardiovascular;  Laterality: N/A;   MALONEY DILATION  05/10/2019   Procedure: [name] DILATION;  Surgeon: Malissa Hippo, MD;  [facility];  Service: Endoscopy;;   MELANOMA EXCISION Left ~ 2016   forearm   POLYPECTOMY  05/10/2019   Procedure: POLYPECTOMY;  Surgeon: Malissa Hippo, MD;  [facility];  Service: Endoscopy;;  colon   POLYPECTOMY  09/01/2022   Procedure: POLYPECTOMY INTESTINAL;  Surgeon: Dolores Frame, MD;  [facility];  Service: Gastroenterology;;   SPLENECTOMY, 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   spontaneous rupture   SUBMUCOSAL LIFTING INJECTION  09/01/2022   Procedure: SUBMUCOSAL LIFTING INJECTION;  Surgeon: Dolores Frame, MD;  [facility];  Service: Gastroenterology;;   SUBMUCOSAL TATTOO INJECTION  09/01/2022   Procedure: SUBMUCOSAL TATTOO INJECTION;  Surgeon: Dolores Frame, MD;  [facility];  Service: Gastroenterology;;   TEMPORARY PACEMAKER N/A 12/29/2019   Procedure: TEMPORARY PACEMAKER;  Surgeon: Corky Crafts, MD;  Location: [facility] INVASIVE CV LAB;  Service: Cardiovascular;  Laterality: N/A;   Past Medical History:  Diagnosis Date   Anxiety    Arthritis    "hands, arms, back, neck, knees, fingers" (01/21/2016)   Bradycardia    Breast cancer, left breast (HCC) 1970s; 2015   CAD (coronary artery disease)    a. s/p  NSTEMI in 2017 with DES to OM and unsuccessful PCI to RCA with wire dissection b. DES to RCA in 2021   Cancer of skin of leg    BLE   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HCC)    left   Chronic lower back pain    Chronic pain    COPD (chronic obstructive pulmonary disease) (HCC)    Coronary artery disease    Depression    GERD (gastroesophageal reflux disease)    Heart murmur    Hepatic cirrhosis (HCC) 12/20/2017   Hepatitis    History of blood transfusion    "related to spleen"   History of hiatal hernia    History of kidney stones    Hyperlipidemia    Hypertension    Hypothyroidism    Kidney stone    Melanoma of forearm, left (HCC)    Migraine    "a few migraines/year" (01/21/2016)   Obesity    Stroke (HCC) 04/2003   2 carotid artery stents in place; small left parietal and left hemispheric CVA.[name] 01/21/2016   Tobacco use disorder    50 pack years; questionably discontinued in 2010   Wears glasses    BP 122/81   Pulse 71   Ht 5\' 3"  (1.6 m)   Wt 183 lb (83 kg)   SpO2 95%   BMI 32.42 kg/m   Opioid Risk Score:   Fall Risk Score:  `1  Depression screen [facility] 2/9     01/01/2023   11:22 AM 12/15/2022    2:52 PM 12/10/2022   12:29 PM 11/05/2022    2:02 PM 10/19/2022    9:58 AM  Depression screen PHQ 2/9  Decreased Interest 0 1  1 3   Down, Depressed, Hopeless 2 1  0 3  PHQ - 2 Score 2 2  1 6   Altered sleeping 2 0  1 3  Tired, decreased energy 3 2  1 2   Change in appetite 0 2  1 0  Feeling bad or failure about yourself  1 0  0 2  Trouble concentrating 0 0  0 3  Moving slowly or fidgety/restless 0 0  0 0  Suicidal thoughts 0 0  0 0  PHQ-9 Score 8 6  4 16   Difficult doing work/chores  Somewhat difficult  Not difficult at all Somewhat difficult     Information is confidential and restricted. Go to Review Flowsheets to unlock data.    Review of Systems  Constitutional:        Weight gain  Respiratory:  Positive for shortness of breath and wheezing.   Cardiovascular:   Positive for leg swelling.  Gastrointestinal:  Positive for nausea and vomiting.  Hematological:  Bruises/bleeds easily.  All other systems reviewed and are negative.      Objective:   Physical Exam   Gen: no distress, normal appearing HEENT: oral mucosa pink and moist, NCAT Chest: normal effort, normal rate of breathing Abd: soft, non-distended Ext: no edema Psych: pleasant, normal affect Skin: intact Neuro: Alert and oriented x3, follows simple commands, fluent speech, comprehension intact Poor memory regarding medication use Decreased/alerted sensation L face Shoulder shrug intact Tongue protrudes to L Facial strength intact Strength 5/5 RUE and RLE Strength 4/5 LUE and LLE DTR normal and symmetric  Musculoskeletal:  Arthritic changes in b/l hands noted L knee tenderness to palpation Mild B/L hand tenderness  L 1st CMC pain with palpation and PROM + L TMJ tenderness and clicking  + neck paraspinal tenderness to palpation + trapezius tenderness to palpation, Mild L shoulder tenderness Spurling's- neck pain resulted  SLR negative   Xray R hand  2020 Right hand x-rays   Right wrist pain   No fracture dislocation or malalignment joint spaces seem preserved   Impression normal x-ray of the right hand  Xray L hand  2020  Left hand pain swelling numbness tingling   AP lateral oblique of the left hand no fracture dislocation or bony arthritis is seen.  Alignment looks normal.   Impression normal left hand x-ray  L knee Xray  12/25/21 On my read appears to show evidence  mild OA   C spine xary 2017 FINDINGS: Bones are osteopenic. Advanced spondylosis and degenerative disc disease at C5-6 with disc space loss, sclerosis and large osteophytes mild degenerative changes at other levels. Mild diffuse facet arthropathy. Facets appear aligned. Foraminal encroachment suspected of the left C5-6 foramen related to bony spurring. Trachea is midline. Odontoid is  intact.   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   No acute osseous finding by plain radiography.     CT C spine 2016 IMPRESSION: 1. Cervical spine degenerative changes. 2. Straightening of the normal cervical lordosis. 3. 9 mm right lobe thyroid nodule, too small to characterize, but most likely benign in the absence of known clinical risk factors for thyroid carcinoma.    Assessment & Plan:  1) Chronic neck pain with prior imaging L spine spondylosis 2) Polyarthralgia with L knee pain, L shoulder pain, b/l hand pain  3) L sided TMJ pain vs trigeminal N compression   -MRI brace and face pending  4) Prior CVA 5) L 1st CMC pain  -Order L thumb xray -Order TENS, Nexwave -Chronic use of Norco 10 for years by prior PCP who has retired -UDS consider  continuation of Norco 10mg  QID PRN, will attempt other treatments to help reduce pain and potentially wean down -Discussed risks of opiod medications, increased with benzodiazapine use. She will try to wean  -Increased Nortriptyline to 25mg , she reports this has been helping her facial pain    01/12/23 Medication ordered as above, called pt and reviewed UDS

## 2023-01-04 ENCOUNTER — Other Ambulatory Visit (INDEPENDENT_AMBULATORY_CARE_PROVIDER_SITE_OTHER): Payer: Self-pay | Admitting: Gastroenterology

## 2023-01-04 DIAGNOSIS — R1319 Other dysphagia: Secondary | ICD-10-CM

## 2023-01-04 DIAGNOSIS — K219 Gastro-esophageal reflux disease without esophagitis: Secondary | ICD-10-CM

## 2023-01-04 LAB — TOXASSURE SELECT,+ANTIDEPR,UR

## 2023-01-06 ENCOUNTER — Ambulatory Visit (HOSPITAL_COMMUNITY): Payer: 59

## 2023-01-06 DIAGNOSIS — M26622 Arthralgia of left temporomandibular joint: Secondary | ICD-10-CM

## 2023-01-06 DIAGNOSIS — R202 Paresthesia of skin: Secondary | ICD-10-CM

## 2023-01-06 DIAGNOSIS — R1312 Dysphagia, oropharyngeal phase: Secondary | ICD-10-CM

## 2023-01-06 DIAGNOSIS — I8511 Secondary esophageal varices with bleeding: Secondary | ICD-10-CM

## 2023-01-06 NOTE — Therapy (Signed)
OUTPATIENT PHYSICAL THERAPY TREATMENT   Patient Name: [name] MRN: [id number] DOB:1950/10/11, 72 y.o., female Today's Date: 01/06/2023  END OF SESSION:  PT End of Session - 01/06/23 1155     Visit Number 2    Number of Visits 6    Date for PT Re-Evaluation 02/10/23    Authorization Type [employer] Dual Complete    Authorization Time Period 12/30/22-02/10/23    Progress Note Due on Visit 6    PT Start Time 1145    PT Stop Time 1215    PT Time Calculation (min) 30 min    Activity Tolerance Patient tolerated treatment well;Patient limited by pain    Behavior During Therapy [facility] for tasks assessed/performed             Past Medical History:  Diagnosis Date   Anxiety    Arthritis    "hands, arms, back, neck, knees, fingers" (01/21/2016)   Bradycardia    Breast cancer, left breast (HCC) 1970s; 2015   CAD (coronary artery disease)    a. s/p NSTEMI in 2017 with DES to OM and unsuccessful PCI to RCA with wire dissection b. DES to RCA in 2021   Cancer of skin of leg    BLE   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HCC)    left   Chronic lower back pain    Chronic pain    COPD (chronic obstructive pulmonary disease) (HCC)    Coronary artery disease    Depression    GERD (gastroesophageal reflux disease)    Heart murmur    Hepatic cirrhosis (HCC) 12/20/2017   Hepatitis    History of blood transfusion    "related to spleen"   History of hiatal hernia    History of kidney stones    Hyperlipidemia    Hypertension    Hypothyroidism    Kidney stone    Melanoma of forearm, left (HCC)    Migraine    "a few migraines/year" (01/21/2016)   Obesity    Stroke (HCC) 04/2003   2 carotid artery stents in place; small left parietal and left hemispheric CVA.[name] 01/21/2016   Tobacco use disorder    50 pack years; questionably discontinued in 2010   Wears glasses    Past Surgical History:  Procedure Laterality Date   ABDOMINAL HYSTERECTOMY  1978   APPENDECTOMY     BIOPSY  04/28/2022    Procedure: BIOPSY;  Surgeon: Marguerita Merles, Reuel Boom, MD;  [facility];  Service: Gastroenterology;;   BREAST BIOPSY Left 1970s; 2015   BREAST LUMPECTOMY Left 1970s   BREAST LUMPECTOMY WITH NEEDLE LOCALIZATION Left 07/07/2013   Procedure: BREAST LUMPECTOMY WITH NEEDLE LOCALIZATION;  Surgeon: Robyne Askew, MD;  Location: [facility];  Service: General;  Laterality: Left;   CARDIAC CATHETERIZATION N/A 01/22/2016   Procedure: Left Heart Cath and Coronary Angiography;  Surgeon: Peter M Swaziland, MD;  Location: [facility] INVASIVE CV LAB;  Service: Cardiovascular;  Laterality: N/A;   CARDIAC CATHETERIZATION N/A 01/22/2016   Procedure: Coronary Stent Intervention;  Surgeon: Peter M Swaziland, MD;  Location: [facility] INVASIVE CV LAB;  Service: Cardiovascular;  Laterality: N/A;   CAROTID STENT Left 04/2003   small left parietal and left hemispheric CVA/notes 07/29/2010   CHOLECYSTECTOMY OPEN     COLONOSCOPY  2011   COLONOSCOPY N/A 05/10/2019   Procedure: COLONOSCOPY;  Surgeon: Malissa Hippo, MD;  [facility];  Service: Endoscopy;  Laterality: N/A;  1:45   COLONOSCOPY WITH PROPOFOL  N/A 09/01/2022   Procedure: COLONOSCOPY WITH PROPOFOL;  Surgeon: Dolores Frame, MD;  [facility];  Service: Gastroenterology;  Laterality: N/A;  1:45PM;ASA 3   CORONARY ATHERECTOMY N/A 12/29/2019   Procedure: CORONARY ATHERECTOMY;  Surgeon: Corky Crafts, MD;  Location: [facility] INVASIVE CV LAB;  Service: Cardiovascular;  Laterality: N/A;  Prox RCA   CORONARY STENT INTERVENTION N/A 12/29/2019   Procedure: CORONARY STENT INTERVENTION;  Surgeon: Corky Crafts, MD;  Location: [facility] INVASIVE CV LAB;  Service: Cardiovascular;  Laterality: N/A;  Prox RCA   CORONARY ULTRASOUND/IVUS N/A 12/29/2019   Procedure: Intravascular Ultrasound/IVUS;  Surgeon: Corky Crafts, MD;  Location: [facility] INVASIVE CV LAB;  Service: Cardiovascular;  Laterality: N/A;   ESOPHAGEAL DILATION N/A  04/28/2022   Procedure: ESOPHAGEAL DILATION;  Surgeon: Dolores Frame, MD;  [facility];  Service: Gastroenterology;  Laterality: N/A;   ESOPHAGOGASTRODUODENOSCOPY  08/06/2011   Procedure: ESOPHAGOGASTRODUODENOSCOPY (EGD);  Surgeon: Malissa Hippo, MD;  [facility];  Service: Endoscopy;  Laterality: N/A;   ESOPHAGOGASTRODUODENOSCOPY N/A 05/10/2019   Procedure: ESOPHAGOGASTRODUODENOSCOPY (EGD);  Surgeon: Malissa Hippo, MD;  [facility];  Service: Endoscopy;  Laterality: N/A;   ESOPHAGOGASTRODUODENOSCOPY (EGD) WITH PROPOFOL N/A 04/28/2022   Procedure: ESOPHAGOGASTRODUODENOSCOPY (EGD) WITH PROPOFOL;  Surgeon: Dolores Frame, MD;  [facility];  Service: Gastroenterology;  Laterality: N/A;  10:30 am, pt can't move up due to transportation   FLEXIBLE SIGMOIDOSCOPY  08/06/2011   Procedure: FLEXIBLE SIGMOIDOSCOPY;  Surgeon: Malissa Hippo, MD;  [facility];  Service: Endoscopy;  Laterality: N/A;   HEMOSTASIS CLIP PLACEMENT  09/01/2022   Procedure: HEMOSTASIS CLIP PLACEMENT;  Surgeon: Dolores Frame, MD;  [facility];  Service: Gastroenterology;;   INGUINAL HERNIA REPAIR Left 1970s   IR ANGIO INTRA EXTRACRAN SEL COM CAROTID INNOMINATE BILAT MOD SED  11/01/2020   IR ANGIO VERTEBRAL SEL VERTEBRAL BILAT MOD SED  11/01/2020   IR RADIOLOGIST EVAL & MGMT  09/23/2020   IR US GUIDE VASC ACCESS RIGHT  11/01/2020   LARYNX SURGERY  1970s   Polyps excised   LEFT HEART CATH AND CORONARY ANGIOGRAPHY N/A 12/29/2019   Procedure: LEFT HEART CATH AND CORONARY ANGIOGRAPHY;  Surgeon: Corky Crafts, MD;  Location: [facility] INVASIVE CV LAB;  Service: Cardiovascular;  Laterality: N/A;   LEFT HEART CATH AND CORONARY ANGIOGRAPHY N/A 10/12/2022   Procedure: LEFT HEART CATH AND CORONARY ANGIOGRAPHY;  Surgeon: Corky Crafts, MD;  Location: [facility] INVASIVE CV LAB;  Service: Cardiovascular;  Laterality: N/A;   MALONEY DILATION   05/10/2019   Procedure: [name] DILATION;  Surgeon: Malissa Hippo, MD;  [facility];  Service: Endoscopy;;   MELANOMA EXCISION Left ~ 2016   forearm   POLYPECTOMY  05/10/2019   Procedure: POLYPECTOMY;  Surgeon: Malissa Hippo, MD;  [facility];  Service: Endoscopy;;  colon   POLYPECTOMY  09/01/2022   Procedure: POLYPECTOMY INTESTINAL;  Surgeon: Dolores Frame, MD;  [facility];  Service: Gastroenterology;;   SPLENECTOMY, 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   spontaneous rupture   SUBMUCOSAL LIFTING INJECTION  09/01/2022   Procedure: SUBMUCOSAL LIFTING INJECTION;  Surgeon: Dolores Frame, MD;  [facility];  Service: Gastroenterology;;   SUBMUCOSAL TATTOO INJECTION  09/01/2022   Procedure: SUBMUCOSAL TATTOO INJECTION;  Surgeon: Dolores Frame, MD;  [facility];  Service: Gastroenterology;;   TEMPORARY PACEMAKER N/A 12/29/2019   Procedure: TEMPORARY PACEMAKER;  Surgeon: Corky Crafts, MD;  Location: [facility] INVASIVE CV LAB;  Service: Cardiovascular;  Laterality: N/A;     PCP: Christel Mormon, MD  REFERRING PROVIDER: Nita Sells, MD   REFERRING DIAG: Left temporomandibular joint dysfunction  THERAPY DIAG:  Arthralgia of left temporomandibular joint  Paresthesia of skin  Oropharyngeal dysphagia  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 She says visit with physiatry went well. MRI reports still not back. Pt says symptoms of pan problem are  improving and frequency of clicking is reducing as well. She reports being able to avoid most clicking during talking now, which is big change.   HISTORY OF PRESENT ILLNESS:   Naydeline Nicholas is a 71yoF who is referred to OPPT with CC left TMJ clicking/pain has been ongoing and stable for about 5-6 months, but then early September had a rapid progression in numbness about the ipsilateral face below the cheek bone, along the jaw line. Pt went to ED in fear of a CVA (prior history). CVA ruled out at that time, DC to home. Additional MRI orders post DC for TMJ specifically, results pending as of 10/16. Pt reports clicking to be consistent, but not locking, not restricting any specific foods, only aggravated with repitition required for eating. Pt reports her numbness of face has a rapid progressive onset rather than sudden acute onset, has not changed much since onset. ED doc also recommending self referral to dentistry for evaluation.   PAIN:  Are you having pain? Yes 7/10, LEFT LOWER FACE/JAW  PRECAUTIONS: None  WEIGHT BEARING RESTRICTIONS: None  FALLS:  Has patient fallen in last 6 months? No  PATIENT GOALS: resolve pain, clicking, find out why her face is numb   NEXT MD VISIT: unknown, asked to set up a dentistry appointment, but recommended she wait until MRI results available.   OBJECTIVE:  Note: Objective measures were completed at Evaluation unless otherwise noted.  DIAGNOSTIC FINDINGS:    Has a trigeminal MRI and brain MRI done, read is still pending.   Head CT on 11/23/22 "FINDINGS: Brain: Normal anatomic configuration. No abnormal intra  or extra-axial mass lesion or fluid collection. No abnormal mass effect or midline shift. No evidence of acute intracranial hemorrhage or infarct. Ventricular size is normal. Cerebellum unremarkable.  Vascular: Unremarkable  Skull: Intact  Sinuses/Orbits: Paranasal sinuses are clear. Orbits are unremarkable.  Other: Mastoid air cells and  middle ear cavities are clear.  IMPRESSION: 1. No acute intracranial hemorrhage or infarct.   Electronically Signed   By: Helyn Numbers M.D.   On: 11/23/2022 17:58"  PATIENT SURVEYS:  None  COGNITION: Overall cognitive status: Within functional limits for tasks assessed  SENSATION: Light touch: Impaired Left lateral inferior face along the angle of the mandible, stocking paresthesia, no sensory changes above the cheek bone, ear light touch sensation intact, still feels pressure, touch senation/proprio intact.  POSTURE: forward head From GHJ, Left ear canal 35 degrees  PALPATION: Normal tone, tenderness on Right side; left side feels some what edematous, allodynia, lumpy, does not tolerate palpation enough to assess trigger points specifically. Also tender/painful in left medial pterygoid.    CERVICAL ROM:   Active ROM 12/31/22 A/PROM (deg) eval  Flexion 35 degrees  Right rotation 60 degrees  Left rotation 45 degrees  Jaw opening  40mm  Rt lateral jaw glide  5mm  Lt lateral jaw glide  0mm  Protrusion  5mm   1. Jaw opening is devoid of secondary protrusion movement   TODAY'S TREATMENT:                                                                                                                              DATE:   01/06/23: -review of interval symptoms and performance of HEP -video used to educate about TMJ arthrokinematics and relate to pt which parts are aberrant at present and where the clicking in coming from  -cervical rotation 1x15 bilat  -cervical extension 1x15 -seated protrusion of jaw 1x15 -seated jaw left lateral glide P/ROM with hand 1x5 (now causes clicking and is moving)  *pt unable to find a pain/click-free range in which to perform *modified to a sustained hold/stretch 2x30sec which is pain free in session today -scapular retraction 1x15x3secH (added to HEP)    12/31/22 -Rt lateral glide grade 4 mobilization 1x30sec, then pt performs on self as  directed for HEP education -seated jaw protrusion A/ROM x10 for HEP education  -seated cervical rotation x5 bilat -seated cervical extension x5       PATIENT EDUCATION:  Education details:  HEP and what PT can and cannot help with; recs to get scheduled with a dentist  Person educated: Patient and DSS rep Education method: explanation, expressive gesturing Education comprehension: affirms understanding   HOME EXERCISE PROGRAM: Access Code: [email or web address] URL: [email or web address] Date: 01/06/2023 Prepared by: Alvera Novel  Exercises - Jaw Glide Right  - 1 x daily - 2 reps - 30 hold - Jaw Protraction  - 3 x daily - 1 sets - 15 reps - Seated Cervical Rotation AROM  - 3 x  daily - 1 sets - 15 reps - Seated Cervical Retraction and Extension  - 3 x daily - 1 sets - 15 reps - Seated Scapular Retraction  - 3 x daily - 15 reps - 3 hold  ASSESSMENT:  CLINICAL IMPRESSION:  Pt back for visit 2. Has been diligent with HEP. Pt resting and daily exacerbation both improved. Pt has still not scheduled with dentist, but Thereasa Parkin explained what benefits are to be expected from being seen. Pt now has left jaw lateral gliding ROM~5 mm, previously zero, but now is clicking- asked to modify to a sustained P/ROM stretch at end range.  PT may be helpful in reducing myofacials pain in left jaw muscles. will help pt achieve LT goals of care, reduce pain, return to baseline.      OBJECTIVE IMPAIRMENTS: increased edema, increased fascial restrictions, and increased muscle spasms.   ACTIVITY LIMITATIONS: self feeding  PARTICIPATION LIMITATIONS: medication management  PERSONAL FACTORS: Age, Education, and Time since onset of injury/illness/exacerbation are also affecting patient's functional outcome.   REHAB POTENTIAL: Fair  CLINICAL DECISION MAKING: Evolving/moderate complexity  EVALUATION COMPLEXITY: Moderate   GOALS: Goals reviewed with patient? Yes  SHORT TERM GOALS: Target date:  01/30/23  Pt to report reduced worst pain in left jaw by >2/10 points on NPRS.  Baseline: 10/10 Goal status: INITIAL  2.  Pt to report improved pleasure in eating her meals and reduced anxiety regarding triggering of symptoms by 50%.  Baseline:  Goal status: INITIAL  3.  Pt to show jaw opening of >52mm Baseline: 40mm without any protrusion  Goal status: INITIAL  4.  Pt to show >11mm lateral jaw glide bilaterally to imply improved Left TMJ arthrokinematics for opening.   Baseline: 0 degrees Rt glide  Goal status: INITIAL   LONG TERM GOALS: Target date: 02/13/23  Pt to report no greater than 4/10 pain within given week and 2-3 ways to manage pain flares at home.  Baseline:  Goal status: INITIAL    PLAN:  PT FREQUENCY: 2x/week  PT DURATION: 6 weeks  PLANNED INTERVENTIONS: 97110-Therapeutic exercises, 97530- Therapeutic activity, 97112- Neuromuscular re-education, 97535- Self Care, and 16109- Manual therapy  PLAN FOR NEXT SESSION: feedback on HEP updates HEP, review MRI findings if relevant, FU on dentist appointment   Leiam Hopwood C, PT 01/06/2023, 12:28 PM  Rosamaria Lints, PT Physical Therapist [name] [place]  [phone]

## 2023-01-07 ENCOUNTER — Telehealth (HOSPITAL_COMMUNITY): Payer: Self-pay

## 2023-01-07 ENCOUNTER — Telehealth (HOSPITAL_COMMUNITY): Payer: 59 | Admitting: Psychiatry

## 2023-01-07 ENCOUNTER — Encounter (HOSPITAL_COMMUNITY): Payer: Self-pay | Admitting: Psychiatry

## 2023-01-07 DIAGNOSIS — F17210 Nicotine dependence, cigarettes, uncomplicated: Secondary | ICD-10-CM

## 2023-01-07 DIAGNOSIS — F410 Panic disorder [episodic paroxysmal anxiety] without agoraphobia: Secondary | ICD-10-CM

## 2023-01-07 DIAGNOSIS — Z79899 Other long term (current) drug therapy: Secondary | ICD-10-CM

## 2023-01-07 DIAGNOSIS — F411 Generalized anxiety disorder: Secondary | ICD-10-CM | POA: Diagnosis not present

## 2023-01-07 DIAGNOSIS — Z79891 Long term (current) use of opiate analgesic: Secondary | ICD-10-CM

## 2023-01-07 DIAGNOSIS — F331 Major depressive disorder, recurrent, moderate: Secondary | ICD-10-CM | POA: Diagnosis not present

## 2023-01-07 DIAGNOSIS — F17200 Nicotine dependence, unspecified, uncomplicated: Secondary | ICD-10-CM

## 2023-01-07 MED ORDER — CLORAZEPATE DIPOTASSIUM 3.75 MG PO TABS
3.7500 mg | ORAL_TABLET | Freq: Two times a day (BID) | ORAL | 0 refills | Status: DC | PRN
Start: 2023-01-07 — End: 2023-02-18

## 2023-01-07 MED ORDER — SERTRALINE HCL 25 MG PO TABS
25.0000 mg | ORAL_TABLET | Freq: Every day | ORAL | 1 refills | Status: DC
Start: 2023-01-07 — End: 2023-02-18

## 2023-01-07 NOTE — Patient Instructions (Signed)
We decreased the clorazepate to 3.75 mg twice daily as needed for panic today.  We will continue the decrease next month as well.  We also increased the sertraline (Zoloft) to 25 mg once daily today.  We will plan on increasing this next month as well.

## 2023-01-07 NOTE — Telephone Encounter (Signed)
Author reached out to patient's dentist on her behalf, multiple recommendations from this Thereasa Parkin, ED MD, recent physiatry visit, all have recommended FU with dentistry regarding her TMJ issue and new left facial pain/paresthesia issue. Per office staff today, they report that a general FU  appointment would be of value simply given how long since previous visit, however, they recommended pt be seen by orofacial surgeon for evaluation of her specific issue at present. Provider recommended available persons in [place] and [place] that could fulfil this. Author reported to pass this information along to patient.   Rosamaria Lints, PT Physical Therapist [name] [place]  [phone]

## 2023-01-07 NOTE — Progress Notes (Signed)
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 call back or seek an in-person evaluation if the symptoms worsen or if the condition fails to improve as anticipated.  I provided 30 minutes dedicated to the care of this patient via video on the date of this encounter to include chart review, face-to-face time with the patient, medication management/counseling, coordination of care with primary care provider.  Elsie Lincoln,  MD 10/24/202412:40 PM

## 2023-01-11 ENCOUNTER — Telehealth: Payer: Self-pay | Admitting: *Deleted

## 2023-01-11 ENCOUNTER — Telehealth: Payer: Self-pay

## 2023-01-11 DIAGNOSIS — G894 Chronic pain syndrome: Secondary | ICD-10-CM

## 2023-01-11 NOTE — Telephone Encounter (Signed)
Called pt. Relayed results per Dr. Frances Furbish note. She verbalized understanding. She is still experiencing pain. Confirmed she is taking nortriptyline 10 mg at bedtime and flexeril 5 mg at bedtime PRN. Helps some. Wondering if there is something else that can help pain more. She can only sleep for an hour at a time/hurts to yawn.  PT made sx worse, she stopped this. Aware I will route back to Dr. Frances Furbish for recommendations.  She will f/u with dentist about mouth guard as well.

## 2023-01-11 NOTE — Telephone Encounter (Signed)
Best next option is to see dentist.

## 2023-01-11 NOTE — Telephone Encounter (Signed)
Patient called back about pain medication.

## 2023-01-11 NOTE — Telephone Encounter (Signed)
-----   Message from Huston Foley sent at 01/11/2023  2:56 PM EDT ----- Please advise patient that her brain and facial MRI did not show any acute findings. 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 I recommend she FU with her dentist to discuss a bite guard; this was also recommended by Dr. Delena Bali in Sept.

## 2023-01-11 NOTE — Telephone Encounter (Signed)
Patient is calling asking about pain medication. New patient on 01/01/23, UDS has resulting. Please advise.

## 2023-01-11 NOTE — Telephone Encounter (Signed)
Patient informed, will follow up dentist.

## 2023-01-12 MED ORDER — HYDROCODONE-ACETAMINOPHEN 10-325 MG PO TABS: 1.0000 | ORAL_TABLET | Freq: Four times a day (QID) | ORAL | 0 refills | Status: DC | PRN

## 2023-01-12 MED ORDER — NALOXONE HCL 4 MG/0.1ML NA LIQD: NASAL | 0 refills | Status: DC

## 2023-01-12 NOTE — Telephone Encounter (Signed)
Patient is calling again about her pain medication.It looks like UDS was appropriate. Please address.

## 2023-01-12 NOTE — Addendum Note (Signed)
Addended by: Fanny Dance on: 01/12/2023 01:29 PM   Modules accepted: Orders

## 2023-01-13 ENCOUNTER — Encounter (HOSPITAL_COMMUNITY): Payer: 59

## 2023-01-13 NOTE — Telephone Encounter (Signed)
Pt aware.

## 2023-01-20 ENCOUNTER — Encounter (HOSPITAL_COMMUNITY): Payer: 59 | Admitting: Physical Therapy

## 2023-01-21 ENCOUNTER — Ambulatory Visit: Payer: 59 | Admitting: Family Medicine

## 2023-01-21 ENCOUNTER — Ambulatory Visit: Payer: 59 | Admitting: Internal Medicine

## 2023-01-21 ENCOUNTER — Encounter: Payer: Self-pay | Admitting: *Deleted

## 2023-01-21 ENCOUNTER — Encounter: Payer: Self-pay | Admitting: Family Medicine

## 2023-01-21 VITALS — BP 144/89 | HR 83 | Ht 63.0 in | Wt 180.1 lb

## 2023-01-21 DIAGNOSIS — N939 Abnormal uterine and vaginal bleeding, unspecified: Secondary | ICD-10-CM

## 2023-01-21 NOTE — Progress Notes (Signed)
Established Patient Office Visit   Subjective  Patient ID: [name], female    DOB: 1950-09-13  Age: 72 y.o. MRN: [id number]  Chief Complaint  Patient presents with   Follow-up    [facility] pt. Has had spotting x 2 months, shaking- cold, headache x 2 days, pressure when she urinates.     She  has a past medical history of Anxiety, Arthritis, Bradycardia, Breast cancer, left breast (HCC) (1970s; 2015), CAD (coronary artery disease), Cancer of skin of leg, 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HCC), Chronic lower back pain, Chronic pain, COPD (chronic obstructive pulmonary disease) (HCC), Coronary artery disease, Depression, GERD (gastroesophageal reflux disease), Heart murmur, Hepatic cirrhosis (HCC) (12/20/2017), Hepatitis, History of blood transfusion, History of hiatal hernia, History of kidney stones, Hyperlipidemia, Hypertension, Hypothyroidism, Kidney stone, Melanoma of forearm, left (HCC), Migraine, Obesity, Stroke (HCC) (04/2003), Tobacco use disorder, and Wears glasses.  Vaginal Pain Report  Primary Symptoms: The patient reports experiencing pelvic pain and vaginal bleeding. Chronicity: Symptoms have persisted for two weeks, with intermittent spotting over the past two months. The patient requires medium-absorbency maxi pads, changing them twice daily. Symptom Characteristics: The pain is constant and has been progressively worsening, accompanied by pressure during urination. Associated Symptoms: Additional symptoms include discolored urine, increased frequency of urination, and hematuria. There is no flank pain reported. Vaginal discharge is described as bloody, with no presence of clots. Aggravating Factors: Symptoms are exacerbated by physical activity. Management Attempts: The patient has tried using heating pads, acetaminophen, and oral narcotics, along with increased fluid intake, to manage the symptoms. Sexual and Reproductive History: The patient is not sexually active. She has undergone  a hysterectomy several years ago Menopausal Status: The patient is postmenopausal     Review of Systems  Constitutional:  Negative for chills and fever.  Eyes:  Negative for blurred vision.  Respiratory:  Negative for shortness of breath.   Cardiovascular:  Negative for chest pain.  Gastrointestinal:  Negative for abdominal pain.  Genitourinary:  Positive for dysuria, frequency, hematuria and urgency. Negative for flank pain.       Vaginal pain   Neurological:  Negative for dizziness and headaches.      Objective:     BP (!) 144/89   Pulse 83   Ht 5\' 3"  (1.6 m)   Wt 180 lb 1.9 oz (81.7 kg)   SpO2 93%   BMI 31.91 kg/m  BP Readings from Last 3 Encounters:  01/21/23 (!) 144/89  01/01/23 122/81  12/09/22 124/82      Physical Exam Vitals reviewed.  Constitutional:      General: She is not in acute distress.    Appearance: Normal appearance. She is not ill-appearing, toxic-appearing or diaphoretic.  HENT:     Head: Normocephalic.  Eyes:     General:        Right eye: No discharge.        Left eye: No discharge.     Conjunctiva/sclera: Conjunctivae normal.  Cardiovascular:     Rate and Rhythm: Normal rate.     Pulses: Normal pulses.     Heart sounds: Normal heart sounds.  Pulmonary:     Effort: Pulmonary effort is normal. No respiratory distress.     Breath sounds: Normal breath sounds.  Abdominal:     General: Bowel sounds are normal.     Palpations: Abdomen is soft.     Tenderness: There is abdominal tenderness. There is guarding.  Skin:    General: Skin is warm and  dry.     Capillary Refill: Capillary refill takes less than 2 seconds.  Neurological:     Mental Status: She is alert.  Psychiatric:        Mood and Affect: Mood normal.        Behavior: Behavior normal.      No results found for any visits on 01/21/23.  The ASCVD Risk score (Arnett DK, et al., 2019) failed to calculate for the following reasons:   The patient has a prior MI or stroke  diagnosis    Assessment & Plan:  Vaginal spotting Assessment & Plan: Possible due Vaginal Atrophy or UTI Labs ordered in today's visit , CBC, estrogen, NuSwab, urinalysis, urine culture Advise try using non-scented panty liners to manage spotting and avoid irritation. Keep the area clean with mild, unscented soap, increase fluid intake. wear breathable cotton underwear to reduce moisture buildup. Rest with legs elevated when possible to help circulation, and monitor the spotting. Referral placed to GYN for further evaluation      Orders: -     Urine Culture -     Urinalysis -     Estrogens, total -     Ambulatory referral to Obstetrics / Gynecology -     CBC with Differential/Platelet    Return if symptoms worsen or fail to improve.   Cruzita Lederer Newman Nip, FNP

## 2023-01-21 NOTE — Patient Instructions (Signed)

## 2023-01-21 NOTE — Assessment & Plan Note (Addendum)
Possible due Vaginal Atrophy or UTI Labs ordered in today's visit , CBC, estrogen, NuSwab, urinalysis, urine culture Advise try using non-scented panty liners to manage spotting and avoid irritation. Keep the area clean with mild, unscented soap, increase fluid intake. wear breathable cotton underwear to reduce moisture buildup. Rest with legs elevated when possible to help circulation, and monitor the spotting. Referral placed to GYN for further evaluation

## 2023-01-21 NOTE — Addendum Note (Signed)
Addended by: Ricardo Jericho R on: 01/21/2023 10:25 AM   Modules accepted: Orders

## 2023-01-22 LAB — CBC WITH DIFFERENTIAL/PLATELET
Basophils Absolute: 0.1 10*3/uL (ref 0.0–0.2)
Basos: 1 %
EOS (ABSOLUTE): 0.2 10*3/uL (ref 0.0–0.4)
Eos: 2 %
Hematocrit: 48 % — ABNORMAL HIGH (ref 34.0–46.6)
Hemoglobin: 15.6 g/dL (ref 11.1–15.9)
Immature Grans (Abs): 0 10*3/uL (ref 0.0–0.1)
Immature Granulocytes: 0 %
Lymphocytes Absolute: 2.8 10*3/uL (ref 0.7–3.1)
Lymphs: 31 %
MCH: 29.2 pg (ref 26.6–33.0)
MCHC: 32.5 g/dL (ref 31.5–35.7)
MCV: 90 fL (ref 79–97)
Monocytes Absolute: 1.1 10*3/uL — ABNORMAL HIGH (ref 0.1–0.9)
Monocytes: 12 %
Neutrophils Absolute: 4.7 10*3/uL (ref 1.4–7.0)
Neutrophils: 54 %
Platelets: 411 10*3/uL (ref 150–450)
RBC: 5.34 x10E6/uL — ABNORMAL HIGH (ref 3.77–5.28)
RDW: 13.1 % (ref 11.7–15.4)
WBC: 8.9 10*3/uL (ref 3.4–10.8)

## 2023-01-23 LAB — URINALYSIS
Glucose, UA: NEGATIVE
Nitrite, UA: NEGATIVE
Specific Gravity, UA: 1.03 (ref 1.005–1.030)
Urobilinogen, Ur: 1 mg/dL (ref 0.2–1.0)
pH, UA: 5.5 (ref 5.0–7.5)

## 2023-01-25 ENCOUNTER — Telehealth: Payer: Self-pay

## 2023-01-25 ENCOUNTER — Telehealth: Payer: Self-pay | Admitting: Internal Medicine

## 2023-01-25 ENCOUNTER — Ambulatory Visit: Payer: Self-pay | Admitting: Internal Medicine

## 2023-01-25 NOTE — Telephone Encounter (Signed)
Copied from CRM [id number]. Topic: Clinical - Lab/Test Results >> Jan 25, 2023 10:59 AM Lorin Glass B wrote: Reason for CRM: Patient requested lab results for urine test on 01/21/23 but labs not completed. Made patient aware of this, and that she will receive callback once results are completed.

## 2023-01-25 NOTE — Telephone Encounter (Signed)
Awaiting results

## 2023-01-25 NOTE — Telephone Encounter (Signed)
Copied from CRM [id number]. Topic: Clinical - Lab/Test Results >> Jan 25, 2023  9:25 AM [name] H wrote: Reason for CRM: PT is wanting for someone to reach out to her with her lab results.

## 2023-01-25 NOTE — Telephone Encounter (Signed)
Copied from CRM [id number]. Topic: Clinical - Lab/Test Results >> Jan 25, 2023 10:51 AM Lorin Glass B wrote: Reason for CRM: Urine test lab results   Chief Complaint: UC/UA Lab results.   Notes: Warm transfer from D. Willeen Cass, PCA Agent, with patient on the back line, requesting lab results, after chart review, results are not reported or resulted as of yet. Informed the agent of this, and the patient is aware, routed to the office and pool for follow up.    Reason for Disposition . [1] Other NON-URGENT information for PCP AND [2] does not require PCP response  Answer Assessment - Initial Assessment Questions 2. CALLER: Document the source of call. (e.g., laboratory, patient).     Warm Transfer, Patient on backline, called requesting the result of the pending urine testing. (UC/UA), says it has been 7 days.  Protocols used: PCP Call - No Triage-A-AH

## 2023-01-26 ENCOUNTER — Telehealth: Payer: Self-pay

## 2023-01-26 LAB — URINE CULTURE

## 2023-01-26 NOTE — Telephone Encounter (Signed)
Copied from CRM [id number]. Topic: Clinical - Lab/Test Results >> Jan 26, 2023  9:13 AM Desma Mcgregor wrote: Reason for CRM: Pt calling in to get lab results reviewed. CB# [phone]

## 2023-01-27 ENCOUNTER — Other Ambulatory Visit: Payer: Self-pay | Admitting: Family Medicine

## 2023-01-27 ENCOUNTER — Encounter (HOSPITAL_COMMUNITY): Payer: Self-pay | Admitting: Physical Therapy

## 2023-01-27 ENCOUNTER — Encounter (HOSPITAL_COMMUNITY): Payer: 59 | Admitting: Physical Therapy

## 2023-01-27 MED ORDER — PENICILLIN V POTASSIUM 250 MG PO TABS: 250.0000 mg | ORAL_TABLET | Freq: Two times a day (BID) | ORAL | 0 refills | Status: AC

## 2023-01-27 NOTE — Telephone Encounter (Signed)
lmtrc

## 2023-01-27 NOTE — Progress Notes (Signed)
Your urine culture came back positive, which means you have a bacterial infection. We'll be treating it with penicillin, which is an antibiotic that will help clear the infection.  Here are some self-care tips to help you feel better and prevent future urinary tract infections (UTIs):  Stay Hydrated: Drink plenty of water to help flush out bacteria from your urinary tract. Go When You Need To: Don't hold in your urine. Go to the bathroom as soon as you feel the need. Practice Good Hygiene: After using the bathroom, always wipe from front to back to prevent bacteria from spreading. Avoid Irritants: Avoid using strong soaps, douches, or powders around your genital area, as these can irritate and increase the risk of infection.

## 2023-01-27 NOTE — Therapy (Signed)
PHYSICAL THERAPY DISCHARGE SUMMARY  Visits from Start of Care: 2  Current functional level related to goals / functional outcomes: Pt cancelled as she does not feel treatment is benefical    Remaining deficits: Pain, decreased ROm   Education / Equipment: HEP   Patient agrees to discharge. Patient goals were not met. Patient is being discharged due to not returning since the last visit.  Virgina Organ, PT CLT [phone]

## 2023-01-27 NOTE — Telephone Encounter (Signed)
Your urine culture came back positive, which means you have a bacterial infection. We'll be treating it with penicillin, which is an antibiotic that will help clear the infection.  Here are some self-care tips to help you feel better and prevent future urinary tract infections (UTIs):  Stay Hydrated: Drink plenty of water to help flush out bacteria from your urinary tract. Go When You Need To: Don't hold in your urine. Go to the bathroom as soon as you feel the need. Practice Good Hygiene: After using the bathroom, always wipe from front to back to prevent bacteria from spreading. Avoid Irritants: Avoid using strong soaps, douches, or powders around your genital area, as these can irritate and increase the risk of infection.

## 2023-01-31 ENCOUNTER — Emergency Department (HOSPITAL_COMMUNITY)
Admission: EM | Admit: 2023-01-31 | Discharge: 2023-01-31 | Disposition: A | Discharge status: Home / Self Care | Payer: 59 | Attending: Emergency Medicine | Admitting: Emergency Medicine

## 2023-01-31 ENCOUNTER — Other Ambulatory Visit: Payer: Self-pay

## 2023-01-31 ENCOUNTER — Encounter (HOSPITAL_COMMUNITY): Payer: Self-pay

## 2023-01-31 ENCOUNTER — Emergency Department (HOSPITAL_COMMUNITY): Payer: 59

## 2023-01-31 DIAGNOSIS — R310 Gross hematuria: Secondary | ICD-10-CM | POA: Insufficient documentation

## 2023-01-31 DIAGNOSIS — Z853 Personal history of malignant neoplasm of breast: Secondary | ICD-10-CM | POA: Insufficient documentation

## 2023-01-31 DIAGNOSIS — Z91040 Latex allergy status: Secondary | ICD-10-CM | POA: Diagnosis not present

## 2023-01-31 DIAGNOSIS — Z7901 Long term (current) use of anticoagulants: Secondary | ICD-10-CM | POA: Insufficient documentation

## 2023-01-31 DIAGNOSIS — Z7982 Long term (current) use of aspirin: Secondary | ICD-10-CM | POA: Diagnosis not present

## 2023-01-31 DIAGNOSIS — R319 Hematuria, unspecified: Secondary | ICD-10-CM | POA: Diagnosis present

## 2023-01-31 DIAGNOSIS — R911 Solitary pulmonary nodule: Secondary | ICD-10-CM | POA: Diagnosis not present

## 2023-01-31 LAB — HEPATIC FUNCTION PANEL
ALT: 23 U/L (ref 0–44)
AST: 27 U/L (ref 15–41)
Albumin: 4 g/dL (ref 3.5–5.0)
Alkaline Phosphatase: 75 U/L (ref 38–126)
Bilirubin, Direct: 0.1 mg/dL (ref 0.0–0.2)
Indirect Bilirubin: 0.4 mg/dL (ref 0.3–0.9)
Total Bilirubin: 0.5 mg/dL (ref <1.2)
Total Protein: 7.9 g/dL (ref 6.5–8.1)

## 2023-01-31 LAB — CBC WITH DIFFERENTIAL/PLATELET
Abs Immature Granulocytes: 0.01 10*3/uL (ref 0.00–0.07)
Basophils Absolute: 0.1 10*3/uL (ref 0.0–0.1)
Basophils Relative: 1 %
Eosinophils Absolute: 0.2 10*3/uL (ref 0.0–0.5)
Eosinophils Relative: 2 %
HCT: 47.4 % — ABNORMAL HIGH (ref 36.0–46.0)
Hemoglobin: 15.7 g/dL — ABNORMAL HIGH (ref 12.0–15.0)
Immature Granulocytes: 0 %
Lymphocytes Relative: 28 %
Lymphs Abs: 2.5 10*3/uL (ref 0.7–4.0)
MCH: 29.3 pg (ref 26.0–34.0)
MCHC: 33.1 g/dL (ref 30.0–36.0)
MCV: 88.6 fL (ref 80.0–100.0)
Monocytes Absolute: 1.1 10*3/uL — ABNORMAL HIGH (ref 0.1–1.0)
Monocytes Relative: 12 %
Neutro Abs: 5.1 10*3/uL (ref 1.7–7.7)
Neutrophils Relative %: 57 %
Platelets: 336 10*3/uL (ref 150–400)
RBC: 5.35 MIL/uL — ABNORMAL HIGH (ref 3.87–5.11)
RDW: 13.2 % (ref 11.5–15.5)
WBC: 9 10*3/uL (ref 4.0–10.5)
nRBC: 0 % (ref 0.0–0.2)

## 2023-01-31 LAB — URINALYSIS, ROUTINE W REFLEX MICROSCOPIC
Bacteria, UA: NONE SEEN
Bilirubin Urine: NEGATIVE
Glucose, UA: NEGATIVE mg/dL
Ketones, ur: NEGATIVE mg/dL
Nitrite: NEGATIVE
Protein, ur: 100 mg/dL — AB
RBC / HPF: >50 RBC/hpf (ref 0–5)
Specific Gravity, Urine: 1.009 (ref 1.005–1.030)
pH: 6 (ref 5.0–8.0)

## 2023-01-31 LAB — BASIC METABOLIC PANEL
Anion gap: 9 (ref 5–15)
BUN: 9 mg/dL (ref 8–23)
CO2: 28 mmol/L (ref 22–32)
Calcium: 9.5 mg/dL (ref 8.9–10.3)
Chloride: 100 mmol/L (ref 98–111)
Creatinine, Ser: 0.7 mg/dL (ref 0.44–1.00)
GFR, Estimated: >60 mL/min (ref >60)
Glucose, Bld: 114 mg/dL — ABNORMAL HIGH (ref 70–99)
Potassium: 4.2 mmol/L (ref 3.5–5.1)
Sodium: 137 mmol/L (ref 135–145)

## 2023-01-31 LAB — PROTIME-INR
INR: 1 (ref 0.8–1.2)
Prothrombin Time: 13.8 s (ref 11.4–15.2)

## 2023-01-31 MED ORDER — CEPHALEXIN 500 MG PO CAPS: 500.0000 mg | ORAL_CAPSULE | Freq: Three times a day (TID) | ORAL | 0 refills | Status: AC

## 2023-01-31 NOTE — Discharge Instructions (Addendum)
You will need to see your family doctor to have a CT scan of your chest to look for the cause of the nodule that was seen on the CT scan.  Your blood work and urine sample were okay, there was no signs of infections, I do want you to be on an antibiotic because of the blood in your urine to see if it helps but if it does not he will definitely need to see a urologist, [place] the phone number above to follow-up with them.  Drink plenty of clear liquids to help keep your kidneys flushed out  Thank you for allowing [name] to treat you in the emergency department today.  After reviewing your examination and potential testing that was done it appears that you are safe to go home.  I would like for you to follow-up with your doctor within the next several days, have them obtain your records and follow-up with them to review all potential tests and results from your visit.  If you should develop severe or worsening symptoms return to the emergency department immediately

## 2023-01-31 NOTE — ED Triage Notes (Signed)
Pt c/o blood in her urine a large amount this morning. Pt states that she has had previous episode before of small amounts and urine was checked and nothing came back abnormal but this morning "I filled the commode up with blood."

## 2023-01-31 NOTE — ED Provider Triage Note (Signed)
Emergency Medicine Provider Triage Evaluation Note  [name] , a 72 y.o. female  was evaluated in triage.  Pt complains of hematuria that started this morning.  States she has "filled the toilet" 3 times.  Only blood with urination, no vaginal or rectal bleeding.  She is not on blood thinners, has never had this in the past.  Denies dysuria, fever, abdominal pain, back or flank pain.  Nausea or vomiting.  Review of Systems  Positive: hematuria Negative: As above  Physical Exam  BP (!) 159/85 (BP Location: Right Arm)   Pulse 75   Temp 98.3 F (36.8 C) (Oral)   Resp 18   Ht 5\' 3"  (1.6 m)   Wt 81.6 kg   SpO2 95%   BMI 31.89 kg/m  Gen:   Awake, no distress   Resp:  Normal effort  MSK:   Moves extremities without difficulty  Other:    Medical Decision Making  Medically screening exam initiated at 2:59 PM.  Appropriate orders placed.  Antony Odea was informed that the remainder of the evaluation will be completed by another provider, this initial triage assessment does not replace that evaluation, and the importance of remaining in the ED until their evaluation is complete.     Carmel Sacramento A, PA-C 01/31/23 1500

## 2023-01-31 NOTE — ED Provider Notes (Signed)
[name] Provider Note   CSN: [id number] Arrival date & time: 01/31/23  1259     History  Chief Complaint  Patient presents with   Hematuria    [name] is a 72 y.o. female.   Hematuria   Patient is a 72 year old female, she is on a baby aspirin but takes no other anticoagulants, she presents to the hospital with a complaint of bleeding in her urine which started yesterday, she is not having any abdominal pain back pain headaches or fevers chills nausea or vomiting.  She has no dysuria no urinary frequency or urgency but because she has had multiple episodes of blood in her urine over the last 24 hours she wanted to be checked out.  She does have a history of kidney stones and passed them about 9 months ago but has not had any kidney stone symptoms since that time.    Home Medications Prior to Admission medications   Medication Sig Start Date End Date Taking? Authorizing Provider  cephALEXin (KEFLEX) 500 MG capsule Take 1 capsule (500 mg total) by mouth 3 (three) times daily for 7 days. 01/31/23 02/07/23 Yes Eber Hong, MD  albuterol (PROVENTIL HFA;VENTOLIN HFA) 108 (90 Base) MCG/ACT inhaler Inhale 2 puffs into the lungs every 4 (four) hours as needed for wheezing or shortness of breath.    [provider]  albuterol (PROVENTIL) (2.5 MG/3ML) 0.083% nebulizer solution Take 2.5 mg by nebulization every 6 (six) hours as needed for wheezing or shortness of breath.    [provider]  amLODipine (NORVASC) 5 MG tablet Take 1 tablet (5 mg total) by mouth daily. 11/27/22   Antoine Poche, MD  aspirin EC 81 MG tablet Take 1 tablet (81 mg total) by mouth daily. 05/11/19   Malissa Hippo, MD  aspirin-acetaminophen-caffeine (EXCEDRIN MIGRAINE) [phone] MG tablet Take 1 tablet by mouth daily as needed for headache.    [provider]  azaTHIOprine (IMURAN) 50 MG tablet Take 1 tablet (50 mg total) by  mouth daily. 12/07/22   Dolores Frame, MD  carbamazepine (CARBATROL) 100 MG 12 hr capsule TAKE ONE CAPSULE BY MOUTH 2 TIMES A DAY 12/15/22   Billie Lade, MD  cetirizine (ZYRTEC) 10 MG tablet Take 10 mg by mouth daily.    [provider]  Cholecalciferol (VITAMIN D3 SUPER STRENGTH) 50 MCG (2000 UT) CAPS Take 2,000 Units by mouth daily.    [provider]  clopidogrel (PLAVIX) 75 MG tablet Take 1 tablet (75 mg total) by mouth daily. 11/27/22   Antoine Poche, MD  clorazepate (TRANXENE) 3.75 MG tablet Take 1 tablet (3.75 mg total) by mouth 2 (two) times daily as needed for anxiety. 01/07/23   Elsie Lincoln, MD  cyclobenzaprine (FLEXERIL) 5 MG tablet Take 1 tablet (5 mg total) by mouth at bedtime as needed for muscle spasms. 12/09/22   Ocie Doyne, MD  famotidine (PEPCID) 20 MG tablet Take 1 tablet (20 mg total) by mouth at bedtime. 10/09/19   Ardelia Mems, PA-C  fluticasone (FLONASE) 50 MCG/ACT nasal spray Place 2 sprays into both nostrils as needed for allergies. 03/23/22   [provider]  furosemide (LASIX) 40 MG tablet Take 1 tablet (40 mg total) by mouth daily as needed for fluid (leg/hand swelling.). 11/27/22   Antoine Poche, MD  HYDROcodone-acetaminophen (NORCO) 10-325 MG tablet Take 1 tablet by mouth every 6 (six) hours as needed for moderate pain (pain  score 4-6). 01/12/23   Fanny Dance, MD  isosorbide mononitrate (IMDUR) 120 MG 24 hr tablet Take 1 tablet (120 mg total) by mouth daily. 10/02/22   Strader, Lennart Pall, PA-C  JARDIANCE 10 MG TABS tablet Take 10 mg by mouth every morning. 05/13/21   [provider]  Ketotifen Fumarate (ALLERGY EYE DROPS OP) Place 1 drop into both eyes daily as needed (allergies).    [provider]  levothyroxine (SYNTHROID, LEVOTHROID) 50 MCG tablet Take 50 mcg by mouth daily before breakfast.  07/20/14   [provider]  LORazepam (ATIVAN) 0.5 MG tablet Take 1-2 pills 30 minutes  prior to MRI 12/09/22   Ocie Doyne, MD  losartan (COZAAR) 25 MG tablet Take 1 tablet (25 mg total) by mouth daily. 11/27/22   Antoine Poche, MD  naloxone [facility]) nasal spray 4 mg/0.1 mL In case of opiod overdose 01/12/23   Fanny Dance, MD  nitroGLYCERIN (NITROSTAT) 0.4 MG SL tablet Place 0.4 mg under the tongue every 5 (five) minutes as needed for chest pain.      [provider]  nortriptyline (PAMELOR) 25 MG capsule Take 1 capsule (25 mg total) by mouth at bedtime. 01/01/23   Fanny Dance, MD  ondansetron (ZOFRAN) 4 MG tablet Take 1 tablet (4 mg total) by mouth 3 (three) times daily as needed. 10/09/19   Tawni Pummel B, PA-C  pantoprazole (PROTONIX) 40 MG tablet TAKE 1 TABLET BY MOUTH ONCE A DAY. 01/04/23   Dolores Frame, MD  penicillin v potassium (VEETID) 250 MG tablet Take 1 tablet (250 mg total) by mouth in the morning and at bedtime for 7 days. 01/27/23 02/03/23  Del Nigel Berthold, FNP  potassium chloride SA (KLOR-CON M) 20 MEQ tablet Take 1 tablet (20 mEq total) by mouth daily as needed (Take only when you take Lasix). 07/14/22   Dunn, Tacey Ruiz, PA-C  rosuvastatin (CRESTOR) 40 MG tablet Take 1 tablet (40 mg total) by mouth daily. 11/27/22 02/25/23  Antoine Poche, MD  sertraline (ZOLOFT) 25 MG tablet Take 1 tablet (25 mg total) by mouth daily. 01/07/23   Elsie Lincoln, MD  tamsulosin (FLOMAX) 0.4 MG CAPS capsule Take 0.4 mg by mouth daily. For Kidney Stones 09/21/22   [provider]  ursodiol (ACTIGALL) 500 MG tablet Take 1 tablet (500 mg total) by mouth 2 (two) times daily. 12/07/22   Dolores Frame, MD      Allergies    Gabapentin, Prednisone, Carvedilol, Clonidine derivatives, Diltiazem, Tape, and Latex    Review of Systems   Review of Systems  Genitourinary:  Positive for hematuria.  All other systems reviewed and are negative.   Physical Exam Updated Vital Signs BP (!) 163/81   Pulse 79   Temp 98.4 F  (36.9 C) (Oral)   Resp 17   Ht 1.6 m (5\' 3" )   Wt 81.6 kg   SpO2 92%   BMI 31.89 kg/m  Physical Exam Vitals and nursing note reviewed.  Constitutional:      General: She is not in acute distress.    Appearance: She is well-developed.  HENT:     Head: Normocephalic and atraumatic.     Mouth/Throat:     Pharynx: No oropharyngeal exudate.  Eyes:     General: No scleral icterus.       Right eye: No discharge.        Left eye: No discharge.     Conjunctiva/sclera: Conjunctivae normal.  Pupils: Pupils are equal, round, and reactive to light.  Neck:     Thyroid: No thyromegaly.     Vascular: No JVD.  Cardiovascular:     Rate and Rhythm: Normal rate and regular rhythm.     Heart sounds: Normal heart sounds. No murmur heard.    No friction rub. No gallop.  Pulmonary:     Effort: Pulmonary effort is normal. No respiratory distress.     Breath sounds: Normal breath sounds. No wheezing or rales.  Abdominal:     General: Bowel sounds are normal. There is no distension.     Palpations: Abdomen is soft. There is no mass.     Tenderness: There is no abdominal tenderness.  Musculoskeletal:        General: No tenderness. Normal range of motion.     Cervical back: Normal range of motion and neck supple.  Lymphadenopathy:     Cervical: No cervical adenopathy.  Skin:    General: Skin is warm and dry.     Findings: No erythema or rash.  Neurological:     Mental Status: She is alert.     Coordination: Coordination normal.  Psychiatric:        Behavior: Behavior normal.     ED Results / Procedures / Treatments   Labs (all labs ordered are listed, but only abnormal results are displayed) Labs Reviewed  URINALYSIS, ROUTINE W REFLEX MICROSCOPIC - Abnormal; Notable for the following components:      Result Value   Color, Urine AMBER (*)    APPearance HAZY (*)    Hgb urine dipstick LARGE (*)    Protein, ur 100 (*)    Leukocytes,Ua SMALL (*)    All other components within normal  limits  BASIC METABOLIC PANEL - Abnormal; Notable for the following components:   Glucose, Bld 114 (*)    All other components within normal limits  CBC WITH DIFFERENTIAL/PLATELET - Abnormal; Notable for the following components:   RBC 5.35 (*)    Hemoglobin 15.7 (*)    HCT 47.4 (*)    Monocytes Absolute 1.1 (*)    All other components within normal limits  URINE CULTURE  PROTIME-INR  HEPATIC FUNCTION PANEL    EKG None  Radiology CT Renal Stone Study  Result Date: 01/31/2023 CLINICAL DATA:  Lower abdominal and flank pain.  Gross hematuria. EXAM: CT ABDOMEN AND PELVIS WITHOUT CONTRAST TECHNIQUE: Multidetector CT imaging of the abdomen and pelvis was performed following the standard protocol without IV contrast. RADIATION DOSE REDUCTION: This exam was performed according to the departmental dose-optimization program which includes automated exposure control, adjustment of the mA and/or kV according to patient size and/or use of iterative reconstruction technique. COMPARISON:  08/04/2022 FINDINGS: Lower chest: Pulmonary nodule is seen in the medial right lower lobe, which is incompletely visualized but measures at least 13 mm in diameter. A few other tiny less than 5 mm pulmonary nodules also seen in the right lower lobe. Hepatobiliary: No mass visualized on this unenhanced exam. Prior cholecystectomy again noted. Dilated common bile duct is unchanged, and without significant intrahepatic biliary ductal dilatation. Pancreas: No mass or inflammatory process visualized on this unenhanced exam. 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 Adrenals/Urinary tract: No evidence of urolithiasis or hydronephrosis. Unremarkable unopacified urinary bladder. Stomach/Bowel: No evidence of obstruction, inflammatory process, or abnormal fluid collections. Diverticulosis is seen mainly involving the sigmoid colon, however there is no evidence of diverticulitis.  Vascular/Lymphatic: No  pathologically enlarged lymph nodes identified. No evidence of abdominal aortic aneurysm. Reproductive: Prior hysterectomy noted. Adnexal regions are unremarkable in appearance. Other:  None. Musculoskeletal:  No suspicious bone lesions identified. IMPRESSION: No evidence of urolithiasis, hydronephrosis, or other acute findings. Colonic diverticulosis, without radiographic evidence of diverticulitis. Several right lower lobe nodules, largest is incompletely visualized but measures at least 13 mm. Chest CT is recommended for further evaluation. Electronically Signed   By: Danae Orleans M.D.   On: 01/31/2023 17:03    Procedures Procedures    Medications Ordered in ED Medications - No data to display  ED Course/ Medical Decision Making/ A&P                                 Medical Decision Making Amount and/or Complexity of Data Reviewed Labs: ordered. Radiology: ordered.  Risk Prescription drug management.    This patient presents to the ED for concern of hematuria, this involves an extensive number of treatment options, and is a complaint that carries with it a high risk of complications and morbidity.  The differential diagnosis includes kidney stone, cystitis, coagulopathy, cancer   Co morbidities that complicate the patient evaluation  On aspirin   Additional history obtained:  Additional history obtained from NP notes from office fom 11/7 - had a UA with Bet Hemolytic strep - and gave pcn.   External records from outside source obtained and reviewed including  as above. 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  The pertinent results include: Hematuria present, INR of 1.0, metabolic panel is unremarkable, CBC without anemia or leukocytosis, urinalysis with some white blood cells but many red blood cells   Imaging Studies ordered:  I ordered imaging studies including CT scan of the abdomen and pelvis I independently visualized and interpreted imaging which showed no signs of intra-abdominal significant findings however there does appear to be lung nodule, the patient was informed of this result at the bedside and is agreeable to follow-up with her family doctor to get a CT scan and understands the gravity of this given that she is a smoker I agree with the radiologist interpretation   Cardiac Monitoring: / EKG:  The patient was maintained on a cardiac monitor.  I personally viewed and interpreted the cardiac monitored which showed an underlying rhythm of: Normal sinus rhythm    Problem List / ED Course / Critical interventions / Medication management  Home with cephalexin due to possible UTI I ordered medication including cephalexin for home Reevaluation of the patient after these medicines showed that the patient stable I have reviewed the patients home medicines and have made adjustments as needed   Social Determinants of Health:  Smoker   Test / Admission - Considered:  [place] to follow-up with her family doctor and urology         Final Clinical Impression(s) / ED Diagnoses Final diagnoses:  Lung nodule  Gross hematuria    Rx / DC Orders ED Discharge Orders          Ordered    cephALEXin (KEFLEX) 500 MG  capsule  3 times daily        01/31/23 1825              Eber Hong, MD 01/31/23 [phone]

## 2023-02-01 ENCOUNTER — Encounter: Payer: Self-pay | Admitting: Physical Medicine & Rehabilitation

## 2023-02-01 ENCOUNTER — Ambulatory Visit (HOSPITAL_COMMUNITY)
Admission: RE | Admit: 2023-02-01 | Discharge: 2023-02-01 | Disposition: A | Discharge status: Home / Self Care | Payer: 59 | Source: Ambulatory Visit | Attending: Physical Medicine & Rehabilitation | Admitting: Physical Medicine & Rehabilitation

## 2023-02-01 ENCOUNTER — Encounter: Payer: 59 | Attending: Physical Medicine & Rehabilitation | Admitting: Physical Medicine & Rehabilitation

## 2023-02-01 VITALS — BP 121/82 | HR 85 | Ht 63.0 in | Wt 183.0 lb

## 2023-02-01 DIAGNOSIS — F331 Major depressive disorder, recurrent, moderate: Secondary | ICD-10-CM | POA: Diagnosis present

## 2023-02-01 DIAGNOSIS — Z5181 Encounter for therapeutic drug level monitoring: Secondary | ICD-10-CM | POA: Insufficient documentation

## 2023-02-01 DIAGNOSIS — M79645 Pain in left finger(s): Secondary | ICD-10-CM | POA: Insufficient documentation

## 2023-02-01 DIAGNOSIS — M542 Cervicalgia: Secondary | ICD-10-CM | POA: Diagnosis present

## 2023-02-01 DIAGNOSIS — M2550 Pain in unspecified joint: Secondary | ICD-10-CM | POA: Insufficient documentation

## 2023-02-01 DIAGNOSIS — G8929 Other chronic pain: Secondary | ICD-10-CM | POA: Insufficient documentation

## 2023-02-01 DIAGNOSIS — G894 Chronic pain syndrome: Secondary | ICD-10-CM | POA: Insufficient documentation

## 2023-02-01 DIAGNOSIS — Z79891 Long term (current) use of opiate analgesic: Secondary | ICD-10-CM | POA: Insufficient documentation

## 2023-02-01 MED ORDER — HYDROCODONE-ACETAMINOPHEN 10-325 MG PO TABS: 1.0000 | ORAL_TABLET | Freq: Four times a day (QID) | ORAL | 0 refills | Status: DC | PRN

## 2023-02-01 NOTE — Progress Notes (Signed)
Subjective:    Patient ID: [name], female    DOB: [birth year], 72 y.o.   MRN: [id number]  HPI    HPI 01/01/23   [name] is a 72 y.o. year old female  who  has a past medical history of Anxiety, Arthritis, Bradycardia, Breast cancer, left breast (HCC) (1970s; 2015), CAD (coronary artery disease), Cancer of skin of leg, 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HCC), Chronic lower back pain, Chronic pain, COPD (chronic obstructive pulmonary disease) (HCC), Coronary artery disease, Depression, GERD (gastroesophageal reflux disease), Heart murmur, Hepatic cirrhosis (HCC) (12/20/2017), Hepatitis, History of blood transfusion, History of hiatal hernia, History of kidney stones, Hyperlipidemia, Hypertension, Hypothyroidism, Kidney stone, Melanoma of forearm, left (HCC), Migraine, Obesity, Stroke (HCC) (04/2003), Tobacco use disorder, and Wears glasses.   They are presenting to PM&R clinic as a new patient for pain management evaluation. They were referred by Dr. Durwin Nora for treatment of chronic pain.  Patient reports she has had pain in multiple areas for many years.  Reports she had pain in her neck due to arthritis for many years.  Reports she had previous injections in her neck ESI?  X 3 about 10 years ago that did not significantly reduce her overall pain but did help the burning pain she was having in the area.  She said she was seeing a "bone doctor" and Dr. Romeo Apple.  She reports she previously had cortisone injections in her shoulders more on the left side.  Shoulder pain is doing better recently.  She reports severe swelling and pain around her first [facility] joint.  She also has pain throughout other digits of her bilateral hands due to arthritis.  Additionally she has pain in her knees primarily on the left side.  This pain is worse with ambulation.  For several months she has been having pain around her left jaw.  Reports some shock type pain over her left face.  She has a lot of popping when she  opens and closes her jaw.  Recently had MRI of her face ordered by neurology, results pending.  She also had an MRI of her brain with results pending by neurology due to left-sided weakness rule out additional CVA.   He has a history of depression and anxiety.  Denies HI or SI.  Reports that she lost her son, dog and niece about 1 and half years ago.   Red flag symptoms: No red flags for back pain endorsed in Hx or ROS   Medications tried: Topical medications - denies  Nsaids - Excedrin used in the past Tylenol  Helps for a short time  Opiates   Hydrocodone 10mg  for [employer], retired in July , now Dr. Durwin Nora Gabapentin- Allergy to this medication TCAs  nortriptyline - helping jaw pain  SNRIs  Duloxetine?- not sure      Other treatments: PT-  PT for TMJ jaw, years ago it made the pain worse    TENs unit - denies  Injections- Shoulder injections Surgery - denies  Other  heating pad helps  Interval History 02/01/23  Patient is here for follow-up regarding her chronic neck pain, left-sided facial pain, polyarthralgia.  She had an MRI face completed in October which showed left TMJ degeneration.  Discontinued nortriptyline because she did not feel like it was helping.  She continues to have left facial pain however it is doing better than it was during her past visit.  She continues to have a lot of pain in her neck and  base of her left thumb.  He also has pain in her left knee and left shoulder.  She continues to use hydrocodone to manage her pain, she has used this for years.  She is not have any side effects with the medication.  She reports poor mood, did not tolerate sertraline because she felt it made her mood worse.  She has discontinued this medication.  She reports that she has follow-up with psychiatry scheduled.  Pain Inventory Average Pain 10 Pain Right Now 7 My pain is constant, sharp, and aching  In the last 24 hours, has pain interfered with the following? General  activity 9 Relation with others 9 Enjoyment of life 7 What TIME of day is your pain at its worst? night Sleep (in general) Poor  Pain is worse with: walking, sitting, standing, and some activites Pain improves with: pacing activities and medication Relief from Meds: 10  Family History  Problem Relation Age of Onset   Heart failure Mother    Cancer Father    Heart failure Father    Cancer Sister    Dementia Sister    Heart disease Other    Arthritis Other    Cancer Other    Diabetes Other    Kidney disease Other    Cancer Sister    Heart failure Brother    Social History   Socioeconomic History   Marital status: Divorced    Spouse name: Not on file   Number of children: 2   Years of education: Not on file   Highest education level: Not on file  Occupational History   Occupation: Disabled   Occupation: retired  Tobacco Use   Smoking status: Every Day    Current packs/day: 0.50    Average packs/day: 0.5 packs/day for 40.0 years (20.0 ttl pk-yrs)    Types: Cigarettes    Passive exposure: Current   Smokeless tobacco: Never  Vaping Use   Vaping status: Never Used  Substance and Sexual Activity   Alcohol use: Not Currently   Drug use: Never   Sexual activity: Not Currently  Other Topics Concern   Not on file  Social History Narrative   ** Merged History Encounter **       Right handed   Wears glasses    Drinks 1 cup of coffee daily   Drinks sweet tea twice a week.   Social Determinants of Health   Financial Resource Strain: Low Risk  (12/15/2022)   Overall Financial Resource Strain (CARDIA)    Difficulty of Paying Living Expenses: Not hard at all  Food Insecurity: No Food Insecurity (12/15/2022)   Hunger Vital Sign    Worried About Running Out of Food in the Last Year: Never true    Ran Out of Food in the Last Year: Never true  Transportation Needs: No Transportation Needs (12/15/2022)   PRAPARE - Administrator, Civil Service (Medical): No     Lack of Transportation (Non-Medical): No  Physical Activity: Sufficiently Active (12/15/2022)   Exercise Vital Sign    Days of Exercise per Week: 7 days    Minutes of Exercise per Session: 30 min  Stress: Stress Concern Present (12/15/2022)   [employer] of Occupational Health - Occupational Stress Questionnaire    Feeling of Stress : To some extent  Social Connections: Moderately Isolated (12/15/2022)   Social Connection and Isolation Panel [NHANES]    Frequency of Communication with Friends and Family: More than three times a week  Frequency of Social Gatherings with Friends and Family: More than three times a week    Attends Religious Services: More than 4 times per year    Active Member of Clubs or Organizations: No    Attends Banker Meetings: Never    Marital Status: Divorced   Past Surgical History:  Procedure Laterality Date   ABDOMINAL HYSTERECTOMY  1978   APPENDECTOMY     BIOPSY  04/28/2022   Procedure: BIOPSY;  Surgeon: Marguerita Merles, Reuel Boom, MD;  [facility];  Service: Gastroenterology;;   BREAST BIOPSY Left 1970s; 2015   BREAST LUMPECTOMY Left 1970s   BREAST LUMPECTOMY WITH NEEDLE LOCALIZATION Left 07/07/2013   Procedure: BREAST LUMPECTOMY WITH NEEDLE LOCALIZATION;  Surgeon: Robyne Askew, MD;  Location: [facility];  Service: General;  Laterality: Left;   CARDIAC CATHETERIZATION N/A 01/22/2016   Procedure: Left Heart Cath and Coronary Angiography;  Surgeon: Peter M Swaziland, MD;  Location: [facility] INVASIVE CV LAB;  Service: Cardiovascular;  Laterality: N/A;   CARDIAC CATHETERIZATION N/A 01/22/2016   Procedure: Coronary Stent Intervention;  Surgeon: Peter M Swaziland, MD;  Location: [facility] INVASIVE CV LAB;  Service: Cardiovascular;  Laterality: N/A;   CAROTID STENT Left 04/2003   small left parietal and left hemispheric CVA/notes 07/29/2010   CHOLECYSTECTOMY OPEN     COLONOSCOPY  2011   COLONOSCOPY N/A 05/10/2019   Procedure: COLONOSCOPY;   Surgeon: Malissa Hippo, MD;  [facility];  Service: Endoscopy;  Laterality: N/A;  1:45   COLONOSCOPY WITH PROPOFOL N/A 09/01/2022   Procedure: COLONOSCOPY WITH PROPOFOL;  Surgeon: Dolores Frame, MD;  [facility];  Service: Gastroenterology;  Laterality: N/A;  1:45PM;ASA 3   CORONARY ATHERECTOMY N/A 12/29/2019   Procedure: CORONARY ATHERECTOMY;  Surgeon: Corky Crafts, MD;  Location: [facility] INVASIVE CV LAB;  Service: Cardiovascular;  Laterality: N/A;  Prox RCA   CORONARY STENT INTERVENTION N/A 12/29/2019   Procedure: CORONARY STENT INTERVENTION;  Surgeon: Corky Crafts, MD;  Location: [facility] INVASIVE CV LAB;  Service: Cardiovascular;  Laterality: N/A;  Prox RCA   CORONARY ULTRASOUND/IVUS N/A 12/29/2019   Procedure: Intravascular Ultrasound/IVUS;  Surgeon: Corky Crafts, MD;  Location: [facility] INVASIVE CV LAB;  Service: Cardiovascular;  Laterality: N/A;   ESOPHAGEAL DILATION N/A 04/28/2022   Procedure: ESOPHAGEAL DILATION;  Surgeon: Dolores Frame, MD;  [facility];  Service: Gastroenterology;  Laterality: N/A;   ESOPHAGOGASTRODUODENOSCOPY  08/06/2011   Procedure: ESOPHAGOGASTRODUODENOSCOPY (EGD);  Surgeon: Malissa Hippo, MD;  [facility];  Service: Endoscopy;  Laterality: N/A;   ESOPHAGOGASTRODUODENOSCOPY N/A 05/10/2019   Procedure: ESOPHAGOGASTRODUODENOSCOPY (EGD);  Surgeon: Malissa Hippo, MD;  [facility];  Service: Endoscopy;  Laterality: N/A;   ESOPHAGOGASTRODUODENOSCOPY (EGD) WITH PROPOFOL N/A 04/28/2022   Procedure: ESOPHAGOGASTRODUODENOSCOPY (EGD) WITH PROPOFOL;  Surgeon: Dolores Frame, MD;  [facility];  Service: Gastroenterology;  Laterality: N/A;  10:30 am, pt can't move up due to transportation   FLEXIBLE SIGMOIDOSCOPY  08/06/2011   Procedure: FLEXIBLE SIGMOIDOSCOPY;  Surgeon: Malissa Hippo, MD;  [facility];  Service: Endoscopy;  Laterality: N/A;    HEMOSTASIS CLIP PLACEMENT  09/01/2022   Procedure: HEMOSTASIS CLIP PLACEMENT;  Surgeon: Dolores Frame, MD;  [facility];  Service: Gastroenterology;;   INGUINAL HERNIA REPAIR Left 1970s   IR ANGIO INTRA EXTRACRAN SEL COM CAROTID INNOMINATE BILAT MOD SED  11/01/2020   IR ANGIO VERTEBRAL SEL VERTEBRAL BILAT  MOD SED  11/01/2020   IR RADIOLOGIST EVAL & MGMT  09/23/2020   IR US GUIDE VASC ACCESS RIGHT  11/01/2020   LARYNX SURGERY  1970s   Polyps excised   LEFT HEART CATH AND CORONARY ANGIOGRAPHY N/A 12/29/2019   Procedure: LEFT HEART CATH AND CORONARY ANGIOGRAPHY;  Surgeon: Corky Crafts, MD;  Location: [facility] INVASIVE CV LAB;  Service: Cardiovascular;  Laterality: N/A;   LEFT HEART CATH AND CORONARY ANGIOGRAPHY N/A 10/12/2022   Procedure: LEFT HEART CATH AND CORONARY ANGIOGRAPHY;  Surgeon: Corky Crafts, MD;  Location: [facility] INVASIVE CV LAB;  Service: Cardiovascular;  Laterality: N/A;   MALONEY DILATION  05/10/2019   Procedure: [name] DILATION;  Surgeon: Malissa Hippo, MD;  [facility];  Service: Endoscopy;;   MELANOMA EXCISION Left ~ 2016   forearm   POLYPECTOMY  05/10/2019   Procedure: POLYPECTOMY;  Surgeon: Malissa Hippo, MD;  [facility];  Service: Endoscopy;;  colon   POLYPECTOMY  09/01/2022   Procedure: POLYPECTOMY INTESTINAL;  Surgeon: Dolores Frame, MD;  [facility];  Service: Gastroenterology;;   SPLENECTOMY, 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   spontaneous rupture   SUBMUCOSAL LIFTING INJECTION  09/01/2022   Procedure: SUBMUCOSAL LIFTING INJECTION;  Surgeon: Dolores Frame, MD;  [facility];  Service: Gastroenterology;;   SUBMUCOSAL TATTOO INJECTION  09/01/2022   Procedure: SUBMUCOSAL TATTOO INJECTION;  Surgeon: Dolores Frame, MD;  [facility];  Service: Gastroenterology;;   TEMPORARY PACEMAKER N/A 12/29/2019   Procedure: TEMPORARY PACEMAKER;  Surgeon: Corky Crafts, MD;   Location: [facility] INVASIVE CV LAB;  Service: Cardiovascular;  Laterality: N/A;   Past Surgical History:  Procedure Laterality Date   ABDOMINAL HYSTERECTOMY  1978   APPENDECTOMY     BIOPSY  04/28/2022   Procedure: BIOPSY;  Surgeon: Marguerita Merles, Reuel Boom, MD;  [facility];  Service: Gastroenterology;;   BREAST BIOPSY Left 1970s; 2015   BREAST LUMPECTOMY Left 1970s   BREAST LUMPECTOMY WITH NEEDLE LOCALIZATION Left 07/07/2013   Procedure: BREAST LUMPECTOMY WITH NEEDLE LOCALIZATION;  Surgeon: Robyne Askew, MD;  Location: [facility];  Service: General;  Laterality: Left;   CARDIAC CATHETERIZATION N/A 01/22/2016   Procedure: Left Heart Cath and Coronary Angiography;  Surgeon: Peter M Swaziland, MD;  Location: [facility] INVASIVE CV LAB;  Service: Cardiovascular;  Laterality: N/A;   CARDIAC CATHETERIZATION N/A 01/22/2016   Procedure: Coronary Stent Intervention;  Surgeon: Peter M Swaziland, MD;  Location: [facility] INVASIVE CV LAB;  Service: Cardiovascular;  Laterality: N/A;   CAROTID STENT Left 04/2003   small left parietal and left hemispheric CVA/notes 07/29/2010   CHOLECYSTECTOMY OPEN     COLONOSCOPY  2011   COLONOSCOPY N/A 05/10/2019   Procedure: COLONOSCOPY;  Surgeon: Malissa Hippo, MD;  [facility];  Service: Endoscopy;  Laterality: N/A;  1:45   COLONOSCOPY WITH PROPOFOL N/A 09/01/2022   Procedure: COLONOSCOPY WITH PROPOFOL;  Surgeon: Dolores Frame, MD;  [facility];  Service: Gastroenterology;  Laterality: N/A;  1:45PM;ASA 3   CORONARY ATHERECTOMY N/A 12/29/2019   Procedure: CORONARY ATHERECTOMY;  Surgeon: Corky Crafts, MD;  Location: [facility] INVASIVE CV LAB;  Service: Cardiovascular;  Laterality: N/A;  Prox RCA   CORONARY STENT INTERVENTION N/A 12/29/2019   Procedure: CORONARY STENT INTERVENTION;  Surgeon: Corky Crafts, MD;  Location: [facility] INVASIVE CV LAB;  Service: Cardiovascular;  Laterality: N/A;  Prox RCA   CORONARY ULTRASOUND/IVUS N/A  12/29/2019   Procedure: Intravascular Ultrasound/IVUS;  Surgeon: Corky Crafts, MD;  Location: [facility] INVASIVE CV LAB;  Service: Cardiovascular;  Laterality: N/A;   ESOPHAGEAL DILATION N/A 04/28/2022   Procedure: ESOPHAGEAL DILATION;  Surgeon: Dolores Frame, MD;  [facility];  Service: Gastroenterology;  Laterality: N/A;   ESOPHAGOGASTRODUODENOSCOPY  08/06/2011   Procedure: ESOPHAGOGASTRODUODENOSCOPY (EGD);  Surgeon: Malissa Hippo, MD;  [facility];  Service: Endoscopy;  Laterality: N/A;   ESOPHAGOGASTRODUODENOSCOPY N/A 05/10/2019   Procedure: ESOPHAGOGASTRODUODENOSCOPY (EGD);  Surgeon: Malissa Hippo, MD;  [facility];  Service: Endoscopy;  Laterality: N/A;   ESOPHAGOGASTRODUODENOSCOPY (EGD) WITH PROPOFOL N/A 04/28/2022   Procedure: ESOPHAGOGASTRODUODENOSCOPY (EGD) WITH PROPOFOL;  Surgeon: Dolores Frame, MD;  [facility];  Service: Gastroenterology;  Laterality: N/A;  10:30 am, pt can't move up due to transportation   FLEXIBLE SIGMOIDOSCOPY  08/06/2011   Procedure: FLEXIBLE SIGMOIDOSCOPY;  Surgeon: Malissa Hippo, MD;  [facility];  Service: Endoscopy;  Laterality: N/A;   HEMOSTASIS CLIP PLACEMENT  09/01/2022   Procedure: HEMOSTASIS CLIP PLACEMENT;  Surgeon: Dolores Frame, MD;  [facility];  Service: Gastroenterology;;   INGUINAL HERNIA REPAIR Left 1970s   IR ANGIO INTRA EXTRACRAN SEL COM CAROTID INNOMINATE BILAT MOD SED  11/01/2020   IR ANGIO VERTEBRAL SEL VERTEBRAL BILAT MOD SED  11/01/2020   IR RADIOLOGIST EVAL & MGMT  09/23/2020   IR US GUIDE VASC ACCESS RIGHT  11/01/2020   LARYNX SURGERY  1970s   Polyps excised   LEFT HEART CATH AND CORONARY ANGIOGRAPHY N/A 12/29/2019   Procedure: LEFT HEART CATH AND CORONARY ANGIOGRAPHY;  Surgeon: Corky Crafts, MD;  Location: [facility] INVASIVE CV LAB;  Service: Cardiovascular;  Laterality: N/A;   LEFT HEART CATH AND CORONARY ANGIOGRAPHY N/A  10/12/2022   Procedure: LEFT HEART CATH AND CORONARY ANGIOGRAPHY;  Surgeon: Corky Crafts, MD;  Location: [facility] INVASIVE CV LAB;  Service: Cardiovascular;  Laterality: N/A;   MALONEY DILATION  05/10/2019   Procedure: [name] DILATION;  Surgeon: Malissa Hippo, MD;  [facility];  Service: Endoscopy;;   MELANOMA EXCISION Left ~ 2016   forearm   POLYPECTOMY  05/10/2019   Procedure: POLYPECTOMY;  Surgeon: Malissa Hippo, MD;  [facility];  Service: Endoscopy;;  colon   POLYPECTOMY  09/01/2022   Procedure: POLYPECTOMY INTESTINAL;  Surgeon: Dolores Frame, MD;  [facility];  Service: Gastroenterology;;   SPLENECTOMY, 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   spontaneous rupture   SUBMUCOSAL LIFTING INJECTION  09/01/2022   Procedure: SUBMUCOSAL LIFTING INJECTION;  Surgeon: Dolores Frame, MD;  [facility];  Service: Gastroenterology;;   SUBMUCOSAL TATTOO INJECTION  09/01/2022   Procedure: SUBMUCOSAL TATTOO INJECTION;  Surgeon: Dolores Frame, MD;  [facility];  Service: Gastroenterology;;   TEMPORARY PACEMAKER N/A 12/29/2019   Procedure: TEMPORARY PACEMAKER;  Surgeon: Corky Crafts, MD;  Location: [facility] INVASIVE CV LAB;  Service: Cardiovascular;  Laterality: N/A;   Past Medical History:  Diagnosis Date   Anxiety    Arthritis    "hands, arms, back, neck, knees, fingers" (01/21/2016)   Bradycardia    Breast cancer, left breast (HCC) 1970s; 2015   CAD (coronary artery disease)    a. s/p NSTEMI in 2017 with DES to OM and unsuccessful PCI to RCA with wire dissection b. DES to RCA in 2021   Cancer of skin of leg    BLE  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HCC)    left   Chronic lower back pain    Chronic pain    COPD (chronic obstructive pulmonary disease) (HCC)    Coronary artery disease    Depression    GERD (gastroesophageal reflux disease)    Heart murmur    Hepatic cirrhosis (HCC) 12/20/2017   Hepatitis    History of  blood transfusion    "related to spleen"   History of hiatal hernia    History of kidney stones    Hyperlipidemia    Hypertension    Hypothyroidism    Kidney stone    Melanoma of forearm, left (HCC)    Migraine    "a few migraines/year" (01/21/2016)   Obesity    Stroke (HCC) 04/2003   2 carotid artery stents in place; small left parietal and left hemispheric CVA.[name] 01/21/2016   Tobacco use disorder    50 pack years; questionably discontinued in 2010   Wears glasses    BP 121/82   Pulse 85   Ht 5\' 3"  (1.6 m)   Wt 183 lb (83 kg)   SpO2 92%   BMI 32.42 kg/m   Opioid Risk Score:   Fall Risk Score:  `1  Depression screen [facility] 2/9     02/01/2023    9:35 AM 01/21/2023    9:33 AM 01/01/2023   11:22 AM 12/15/2022    2:52 PM 12/10/2022   12:29 PM 11/05/2022    2:02 PM 10/19/2022    9:58 AM  Depression screen PHQ 2/9  Decreased Interest 3 1 0 1  1 3   Down, Depressed, Hopeless 3 3 2 1   0 3  PHQ - 2 Score 6 4 2 2  1 6   Altered sleeping  3 2 0  1 3  Tired, decreased energy  3 3 2  1 2   Change in appetite  1 0 2  1 0  Feeling bad or failure about yourself   0 1 0  0 2  Trouble concentrating  0 0 0  0 3  Moving slowly or fidgety/restless  0 0 0  0 0  Suicidal thoughts   0 0  0 0  PHQ-9 Score  11 8 6  4 16   Difficult doing work/chores  Somewhat difficult  Somewhat difficult  Not difficult at all Somewhat difficult     Information is confidential and restricted. Go to Review Flowsheets to unlock data.     Review of Systems  Constitutional: Negative.   HENT: Negative.    Eyes: Negative.   Respiratory: Negative.    Cardiovascular: Negative.   Gastrointestinal: Negative.   Endocrine: Negative.   Genitourinary: Negative.   Musculoskeletal:  Positive for arthralgias and back pain.  Skin: Negative.   Allergic/Immunologic: Negative.   Neurological: Negative.   Hematological: Negative.   Psychiatric/Behavioral:  Positive for dysphoric mood.   All other systems reviewed and are  negative.      Objective:   Physical Exam   Gen: no distress, normal appearing HEENT: oral mucosa pink and moist, NCAT Chest: normal effort, normal rate of breathing Abd: soft, non-distended Ext: no edema Psych: pleasant, normal affect Skin: intact Neuro: Alert and oriented x3, follows simple commands, fluent speech, comprehension intact Poor memory regarding medication use Decreased/alerted sensation L face Facial movements intact b/l Strength 5/5 RUE and RLE Strength 4/5 LUE and LLE Musculoskeletal:  Arthritic changes in b/l hands noted L knee tenderness to palpation Mild B/L hand tenderness  L  1st CMC pain with palpation and PROM, mild swelling around this joint + L TMJ tenderness and clicking  + neck paraspinal tenderness to palpation + trapezius tenderness to palpation, Mild L shoulder tenderness Spurling's- neck pain resulted  Slump test negative    Xray R hand  2020 Right hand x-rays   Right wrist pain   No fracture dislocation or malalignment joint spaces seem preserved   Impression normal x-ray of the right hand   Xray L hand  2020  Left hand pain swelling numbness tingling   AP lateral oblique of the left hand no fracture dislocation or bony arthritis is seen.  Alignment looks normal.   Impression normal left hand x-ray   L knee Xray  12/25/21 On my read appears to show evidence  mild OA     C spine xary 2017 FINDINGS: Bones are osteopenic. Advanced spondylosis and degenerative disc disease at C5-6 with disc space loss, sclerosis and large osteophytes mild degenerative changes at other levels. Mild diffuse facet arthropathy. Facets appear aligned. Foraminal encroachment suspected of the left C5-6 foramen related to bony spurring. Trachea is midline. Odontoid is intact.   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   No acute osseous finding by plain radiography.        CT C spine 2016 IMPRESSION: 1. Cervical spine degenerative changes. 2. Straightening of the normal cervical lordosis. 3. 9 mm right lobe thyroid nodule, too small to characterize, but most likely benign in the absence of known clinical risk factors for thyroid carcinoma.   MRI Face and brain 12/23/22 Brain: No evidence of acute infarct, intracranial hemorrhage, mass, midline shift or extra-axial fluid collection. No pathologic enhancement. Mild for age chronic microvascular ischemic change.   Trigeminal nerves: No mass is identified along the course of the trigeminal nerves on thin-slice imaging. No evidence of neurovascular compression.   Vascular: Major arterial flow voids are maintained at the skull base.   Sinuses/Orbits: Clear sinuses. No acute or significant orbital findings.   Soft tissues: Unremarkable.  No visible edema or mass lesion.   Osseous: Normal marrow signal without focal lesion. TMJs are located. Left TMJ degenerative change.   Other: None.   IMPRESSION: 1. No evidence of acute abnormality intracranially or in the face. 2. Left TMJ degenerative change.    Assessment & Plan:   1) Chronic neck pain with prior imaging L spine spondylosis 2) Polyarthralgia with L knee pain, L shoulder pain, b/l hand pain  3) L sided facial pain likely due to TMJ pain.             -MRI brain and face with evidence of left TMJ 4) Prior CVA 5) L 1st CMC pain 6) Depression, denies SI or HI   -Order L thumb xray-advised her to complete this -Ordered TENS, Nexwave last visit-she plans to give TENS unit a try -Chronic use of Norco 10 for years by prior PCP who has retired -Continue Norco 10mg  QID PRN.  Consider weaning down at later time -Discussed risks of opiod medications, increased with benzodiazapine use. She will try to wean  -Patient discontinued nortriptyline since her last visit she did not feel like this was helping -Recheck UDS today -Continue pill counts and  PDMP monitoring -Patient reports has psychiatry scheduled for her depression, discussed trying Cymbalta.  She says she will review the this with her psychiatrist.

## 2023-02-01 NOTE — Addendum Note (Signed)
Addended by: Fanny Dance on: 02/01/2023 02:37 PM   Modules accepted: Orders

## 2023-02-02 ENCOUNTER — Encounter (HOSPITAL_COMMUNITY): Payer: Self-pay | Admitting: Emergency Medicine

## 2023-02-02 ENCOUNTER — Other Ambulatory Visit: Payer: Self-pay

## 2023-02-02 ENCOUNTER — Emergency Department (HOSPITAL_COMMUNITY)
Admission: EM | Admit: 2023-02-02 | Discharge: 2023-02-02 | Disposition: A | Discharge status: Home / Self Care | Payer: 59

## 2023-02-02 ENCOUNTER — Emergency Department (HOSPITAL_COMMUNITY): Payer: 59

## 2023-02-02 DIAGNOSIS — R310 Gross hematuria: Secondary | ICD-10-CM | POA: Diagnosis not present

## 2023-02-02 DIAGNOSIS — N939 Abnormal uterine and vaginal bleeding, unspecified: Secondary | ICD-10-CM | POA: Insufficient documentation

## 2023-02-02 DIAGNOSIS — Z7984 Long term (current) use of oral hypoglycemic drugs: Secondary | ICD-10-CM | POA: Insufficient documentation

## 2023-02-02 DIAGNOSIS — Z79899 Other long term (current) drug therapy: Secondary | ICD-10-CM | POA: Diagnosis not present

## 2023-02-02 DIAGNOSIS — I10 Essential (primary) hypertension: Secondary | ICD-10-CM | POA: Insufficient documentation

## 2023-02-02 DIAGNOSIS — E119 Type 2 diabetes mellitus without complications: Secondary | ICD-10-CM | POA: Insufficient documentation

## 2023-02-02 DIAGNOSIS — Z91040 Latex allergy status: Secondary | ICD-10-CM | POA: Insufficient documentation

## 2023-02-02 DIAGNOSIS — R319 Hematuria, unspecified: Secondary | ICD-10-CM | POA: Diagnosis present

## 2023-02-02 DIAGNOSIS — Z7982 Long term (current) use of aspirin: Secondary | ICD-10-CM | POA: Insufficient documentation

## 2023-02-02 DIAGNOSIS — J449 Chronic obstructive pulmonary disease, unspecified: Secondary | ICD-10-CM | POA: Insufficient documentation

## 2023-02-02 DIAGNOSIS — Z853 Personal history of malignant neoplasm of breast: Secondary | ICD-10-CM | POA: Insufficient documentation

## 2023-02-02 LAB — URINE CULTURE: Culture: NO GROWTH

## 2023-02-02 LAB — URINALYSIS, ROUTINE W REFLEX MICROSCOPIC

## 2023-02-02 LAB — URINALYSIS, MICROSCOPIC (REFLEX)
RBC / HPF: >50 RBC/hpf (ref 0–5)
Squamous Epithelial / HPF: NONE SEEN /[HPF] (ref 0–5)

## 2023-02-02 NOTE — ED Provider Notes (Signed)
[facility] AT [facility] Provider Note   CSN: [id number] Arrival date & time: 02/02/23  1020     History  Chief Complaint  Patient presents with   Vaginal Bleeding    LAKECIA KIRGAN is a 72 y.o. female.   Vaginal Bleeding Associated symptoms: no back pain, no dizziness, no dysuria, no fever, no nausea and no vaginal discharge         LATIQUA GRANDPRE is a 72 y.o. female past medical history of splenectomy, hypertension, type 2 diabetes, COPD and breast cancer who presents to the Emergency Department complaining of recurrent hematuria.  She was seen here 2 days ago for same.  She was started on cephalexin.  She felt as though her symptoms were improving as she only had mild spotting yesterday.  Today, she had recurrent bleeding with urination and believes that current bleeding worse than it was on Sunday.  She has also describes some mild aching pain of her suprapubic area.  She denies any difficulty voiding.  She has been taking the antibiotic as directed.  She denies any nausea vomiting fever chills.  No flank pain   Home Medications Prior to Admission medications   Medication Sig Start Date End Date Taking? Authorizing Provider  albuterol (PROVENTIL HFA;VENTOLIN HFA) 108 (90 Base) MCG/ACT inhaler Inhale 2 puffs into the lungs every 4 (four) hours as needed for wheezing or shortness of breath.    [provider]  albuterol (PROVENTIL) (2.5 MG/3ML) 0.083% nebulizer solution Take 2.5 mg by nebulization every 6 (six) hours as needed for wheezing or shortness of breath.    [provider]  amLODipine (NORVASC) 5 MG tablet Take 1 tablet (5 mg total) by mouth daily. 11/27/22   Antoine Poche, MD  aspirin EC 81 MG tablet Take 1 tablet (81 mg total) by mouth daily. 05/11/19   Malissa Hippo, MD  aspirin-acetaminophen-caffeine (EXCEDRIN MIGRAINE) [phone] MG tablet Take 1 tablet by mouth daily as needed for headache.    [provider]  azaTHIOprine (IMURAN) 50 MG tablet Take 1 tablet (50 mg total) by mouth daily. 12/07/22   Dolores Frame, MD  carbamazepine (CARBATROL) 100 MG 12 hr capsule TAKE ONE CAPSULE BY MOUTH 2 TIMES A DAY 12/15/22   Billie Lade, MD  cephALEXin (KEFLEX) 500 MG capsule Take 1 capsule (500 mg total) by mouth 3 (three) times daily for 7 days. 01/31/23 02/07/23  Eber Hong, MD  cetirizine (ZYRTEC) 10 MG tablet Take 10 mg by mouth daily.    [provider]  Cholecalciferol (VITAMIN D3 SUPER STRENGTH) 50 MCG (2000 UT) CAPS Take 2,000 Units by mouth daily.    [provider]  clopidogrel (PLAVIX) 75 MG tablet Take 1 tablet (75 mg total) by mouth daily. 11/27/22   Antoine Poche, MD  clorazepate (TRANXENE) 3.75 MG tablet Take 1 tablet (3.75 mg total) by mouth 2 (two) times daily as needed for anxiety. 01/07/23   Elsie Lincoln, MD  cyclobenzaprine (FLEXERIL) 5 MG tablet Take 1 tablet (5 mg total) by mouth at bedtime as needed for muscle spasms. 12/09/22   Ocie Doyne, MD  famotidine (PEPCID) 20 MG tablet Take 1 tablet (20 mg total) by mouth at bedtime. 10/09/19   Ardelia Mems, PA-C  fluticasone (FLONASE) 50 MCG/ACT nasal spray Place 2 sprays into both nostrils as needed for allergies. 03/23/22   [provider]  furosemide (LASIX) 40 MG tablet Take 1 tablet (40 mg  total) by mouth daily as needed for fluid (leg/hand swelling.). 11/27/22   Antoine Poche, MD  HYDROcodone-acetaminophen (NORCO) 10-325 MG tablet Take 1 tablet by mouth every 6 (six) hours as needed for moderate pain (pain score 4-6). 02/10/23   Fanny Dance, MD  isosorbide mononitrate (IMDUR) 120 MG 24 hr tablet Take 1 tablet (120 mg total) by mouth daily. 10/02/22   Strader, Lennart Pall, PA-C  JARDIANCE 10 MG TABS tablet Take 10 mg by mouth every morning. 05/13/21   [provider]  Ketotifen Fumarate (ALLERGY EYE DROPS OP) Place 1 drop into both eyes daily as needed  (allergies).    [provider]  levothyroxine (SYNTHROID, LEVOTHROID) 50 MCG tablet Take 50 mcg by mouth daily before breakfast.  07/20/14   [provider]  LORazepam (ATIVAN) 0.5 MG tablet Take 1-2 pills 30 minutes prior to MRI 12/09/22   Ocie Doyne, MD  losartan (COZAAR) 25 MG tablet Take 1 tablet (25 mg total) by mouth daily. 11/27/22   Antoine Poche, MD  naloxone [facility]) nasal spray 4 mg/0.1 mL In case of opiod overdose 01/12/23   Fanny Dance, MD  nitroGLYCERIN (NITROSTAT) 0.4 MG SL tablet Place 0.4 mg under the tongue every 5 (five) minutes as needed for chest pain.      [provider]  ondansetron (ZOFRAN) 4 MG tablet Take 1 tablet (4 mg total) by mouth 3 (three) times daily as needed. 10/09/19   Tawni Pummel B, PA-C  pantoprazole (PROTONIX) 40 MG tablet TAKE 1 TABLET BY MOUTH ONCE A DAY. 01/04/23   Dolores Frame, MD  penicillin v potassium (VEETID) 250 MG tablet Take 1 tablet (250 mg total) by mouth in the morning and at bedtime for 7 days. 01/27/23 02/03/23  Del Nigel Berthold, FNP  potassium chloride SA (KLOR-CON M) 20 MEQ tablet Take 1 tablet (20 mEq total) by mouth daily as needed (Take only when you take Lasix). 07/14/22   Dunn, Tacey Ruiz, PA-C  rosuvastatin (CRESTOR) 40 MG tablet Take 1 tablet (40 mg total) by mouth daily. 11/27/22 02/25/23  Antoine Poche, MD  sertraline (ZOLOFT) 25 MG tablet Take 1 tablet (25 mg total) by mouth daily. 01/07/23   Elsie Lincoln, MD  tamsulosin (FLOMAX) 0.4 MG CAPS capsule Take 0.4 mg by mouth daily. For Kidney Stones 09/21/22   [provider]  ursodiol (ACTIGALL) 500 MG tablet Take 1 tablet (500 mg total) by mouth 2 (two) times daily. 12/07/22   Dolores Frame, MD      Allergies    Gabapentin, Prednisone, Carvedilol, Clonidine derivatives, Diltiazem, Tape, and Latex    Review of Systems   Review of Systems  Constitutional:  Negative for appetite change, chills and  fever.  Respiratory:  Negative for cough and shortness of breath.   Cardiovascular:  Negative for chest pain.  Gastrointestinal:  Negative for abdominal distention, anal bleeding, nausea and vomiting.       Aching pain to her suprapubic area.   Genitourinary:  Positive for hematuria. Negative for difficulty urinating, dysuria, flank pain, vaginal bleeding and vaginal discharge.  Musculoskeletal:  Negative for back pain.  Neurological:  Negative for dizziness, weakness and numbness.  Psychiatric/Behavioral:  Negative for confusion.     Physical Exam Updated Vital Signs BP (!) 144/97 (BP Location: Right Arm)   Pulse 83   Temp (!) 97.3 F (36.3 C)   Resp 18   Ht 5\' 3"  (1.6 m)   Wt 83 kg  SpO2 94%   BMI 32.42 kg/m  Physical Exam Vitals and nursing note reviewed.  Constitutional:      General: She is not in acute distress.    Appearance: Normal appearance. She is not ill-appearing or toxic-appearing.  Cardiovascular:     Rate and Rhythm: Normal rate and regular rhythm.     Pulses: Normal pulses.  Pulmonary:     Effort: Pulmonary effort is normal.  Abdominal:     General: There is no distension.     Palpations: Abdomen is soft.     Tenderness: There is no abdominal tenderness. There is no right CVA tenderness or left CVA tenderness.  Musculoskeletal:        General: Normal range of motion.  Skin:    General: Skin is warm.     Capillary Refill: Capillary refill takes less than 2 seconds.  Neurological:     General: No focal deficit present.     Mental Status: She is alert.     Sensory: No sensory deficit.     Motor: No weakness.     ED Results / Procedures / Treatments   Labs (all labs ordered are listed, but only abnormal results are displayed) Labs Reviewed  URINALYSIS, ROUTINE W REFLEX MICROSCOPIC - Abnormal; Notable for the following components:      Result Value   Color, Urine RED (*)    APPearance TURBID (*)    Glucose, UA   (*)    Value: TEST NOT REPORTED  DUE TO COLOR INTERFERENCE OF URINE PIGMENT   Hgb urine dipstick   (*)    Value: TEST NOT REPORTED DUE TO COLOR INTERFERENCE OF URINE PIGMENT   Bilirubin Urine   (*)    Value: TEST NOT REPORTED DUE TO COLOR INTERFERENCE OF URINE PIGMENT   Ketones, ur   (*)    Value: TEST NOT REPORTED DUE TO COLOR INTERFERENCE OF URINE PIGMENT   Protein, ur   (*)    Value: TEST NOT REPORTED DUE TO COLOR INTERFERENCE OF URINE PIGMENT   Nitrite   (*)    Value: TEST NOT REPORTED DUE TO COLOR INTERFERENCE OF URINE PIGMENT   Leukocytes,Ua   (*)    Value: TEST NOT REPORTED DUE TO COLOR INTERFERENCE OF URINE PIGMENT   All other components within normal limits  URINALYSIS, MICROSCOPIC (REFLEX) - Abnormal; Notable for the following components:   Bacteria, UA FEW (*)    All other components within normal limits    EKG None  Radiology US Renal  Result Date: 02/02/2023 CLINICAL DATA:  Painless hematuria EXAM: RENAL / URINARY TRACT ULTRASOUND COMPLETE COMPARISON:  CT renal stone 01/31/2023 FINDINGS: Right Kidney: Renal measurements: 12.7 x 4.4 x 5.7 cm = volume: 161 mL. Echogenicity within normal limits. No mass or hydronephrosis visualized. Left Kidney: Renal measurements: 11.5 x 6.4 x 5.3 cm = volume: 205 mL. Echogenicity within normal limits. No mass or hydronephrosis visualized. Bladder: Under distended and not well evaluated. Other: None. IMPRESSION: Normal renal ultrasound. Electronically Signed   By: Darliss Cheney M.D.   On: 02/02/2023 18:00    Procedures Procedures    Medications Ordered in ED Medications - No data to display  ED Course/ Medical Decision Making/ A&P                                 Medical Decision Making Pt seen here 2 days ago for hematuria, had work including labs,  U/A and CT renal stone study.  Started on Keflex and returns today due to concern for recurrent hematuria. No pain with urination and feels she is voiding without difficulty.  Hx includes complete hysterectomy.     Differential dx includes Infection, neoplasm, hemorrhagic cystitis  Amount and/or Complexity of Data Reviewed Labs: ordered.    Details: Labs deferred today.  Labs from 2 days ago reviewed by me hemoglobin of 15, chemistries without derangement.  Urinalysis was obtained and urine culture from 2 days ago without growth  Urinalysis today showed turbid red urine test test results not reported due to pigment Radiology: ordered.    Details: Renal ultrasound ordered for further evaluation of patient's hematuria.  There was delay in reading of radiology results, but renal ultrasound without acute finding Discussion of management or test interpretation with external provider(s): Patient is ambulated in the department with steady gait.  She has urinated without difficulty.  Well-appearing. She is ready for discharge home. No weakness or dizziness and had hgb yesterday of 15.   I have explained delay in disposition due to waiting for radiology results.  Patient is come to nursing station stating that she is ready to go home.  States she has no longer having hematuria.  I have apologized for delay  Consulted with urology, Dr. Ronne Binning discussed findings and explained situation of delay in reading of ultrasound results.  He will see patient in close follow-up in clinic and recommends patient to contact his office tomorrow.  Patient is agreeable to this plan and is requesting discharge home.           Final Clinical Impression(s) / ED Diagnoses Final diagnoses:  Gross hematuria    Rx / DC Orders ED Discharge Orders     None         Pauline Aus, PA-C 02/02/23 1902    Durwin Glaze, MD 02/03/23 1901

## 2023-02-02 NOTE — ED Triage Notes (Signed)
Pt reports vaginal bleeding, was seen for same on Sunday; reports improvement but started back x 1 hour ago

## 2023-02-02 NOTE — ED Notes (Signed)
Pt. Awaiting results of urine and ultrasound. No issues at this time.

## 2023-02-02 NOTE — Discharge Instructions (Signed)
As discussed, please call the urologist listed tomorrow morning to arrange a follow-up appointment.  Continue taking your antibiotics as directed.  Return to the emergency department for any new or worsening symptoms.

## 2023-02-03 ENCOUNTER — Encounter: Payer: Self-pay | Admitting: Urology

## 2023-02-03 ENCOUNTER — Ambulatory Visit (INDEPENDENT_AMBULATORY_CARE_PROVIDER_SITE_OTHER): Payer: 59 | Admitting: Urology

## 2023-02-03 ENCOUNTER — Encounter (HOSPITAL_COMMUNITY): Payer: 59 | Admitting: Physical Therapy

## 2023-02-03 ENCOUNTER — Telehealth: Payer: Self-pay

## 2023-02-03 VITALS — BP 115/73 | HR 94

## 2023-02-03 DIAGNOSIS — R310 Gross hematuria: Secondary | ICD-10-CM

## 2023-02-03 LAB — MICROSCOPIC EXAMINATION
Bacteria, UA: NONE SEEN
RBC, Urine: >30 /[HPF] — AB (ref 0–2)

## 2023-02-03 LAB — URINALYSIS, ROUTINE W REFLEX MICROSCOPIC
Bilirubin, UA: NEGATIVE
Leukocytes,UA: NEGATIVE
Nitrite, UA: NEGATIVE
Protein,UA: NEGATIVE
Specific Gravity, UA: 1.015 (ref 1.005–1.030)
Urobilinogen, Ur: 0.2 mg/dL (ref 0.2–1.0)
pH, UA: 6 (ref 5.0–7.5)

## 2023-02-03 NOTE — Telephone Encounter (Signed)
Copied from CRM [id number]. Topic: Clinical - Lab/Test Results >> Feb 03, 2023  9:45 AM Dimitri Ped wrote: Reason for CRM: patient calling in for results concerning xray exams. Dont see any notes

## 2023-02-03 NOTE — Progress Notes (Signed)
02/03/2023 1:23 PM   [name] 1951-03-02 [id number]  Referring provider: Billie Lade, MD [address] [place],  [place] [address]  Gross hematuria   HPI: [name] is a 72yo here for evaluation of gross hematuria. She has been having intermittent gross hematuria for the past 3 months. Starting 4 days ago she developed persistent gross hematuria which then resolved today. UA shows >30 rbcs. She has a hx of hysterectomy for uterine cancer but did not receive radiation. She denies any dysuria or pelvic pain. She was on plavix but it was stopped 1 month ago. She has a 40pk year smoking. Ct stone study from 11/17 showed no GU abnormalities   PMH: Past Medical History:  Diagnosis Date   Anxiety    Arthritis    "hands, arms, back, neck, knees, fingers" (01/21/2016)   Bradycardia    Breast cancer, left breast (HCC) 1970s; 2015   CAD (coronary artery disease)    a. s/p NSTEMI in 2017 with DES to OM and unsuccessful PCI to RCA with wire dissection b. DES to RCA in 2021   Cancer of skin of leg    BLE   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HCC)    left   Chronic lower back pain    Chronic pain    COPD (chronic obstructive pulmonary disease) (HCC)    Coronary artery disease    Depression    GERD (gastroesophageal reflux disease)    Heart murmur    Hepatic cirrhosis (HCC) 12/20/2017   Hepatitis    History of blood transfusion    "related to spleen"   History of hiatal hernia    History of kidney stones    Hyperlipidemia    Hypertension    Hypothyroidism    Kidney stone    Melanoma of forearm, left (HCC)    Migraine    "a few migraines/year" (01/21/2016)   Obesity    Stroke (HCC) 04/2003   2 carotid artery stents in place; small left parietal and left hemispheric CVA.[name] 01/21/2016   Tobacco use disorder    50 pack years; questionably discontinued in 2010   Wears glasses     Surgical History: Past Surgical History:  Procedure Laterality Date   ABDOMINAL  HYSTERECTOMY  1978   APPENDECTOMY     BIOPSY  04/28/2022   Procedure: BIOPSY;  Surgeon: Marguerita Merles, Reuel Boom, MD;  [facility];  Service: Gastroenterology;;   BREAST BIOPSY Left 1970s; 2015   BREAST LUMPECTOMY Left 1970s   BREAST LUMPECTOMY WITH NEEDLE LOCALIZATION Left 07/07/2013   Procedure: BREAST LUMPECTOMY WITH NEEDLE LOCALIZATION;  Surgeon: Robyne Askew, MD;  Location: [facility];  Service: General;  Laterality: Left;   CARDIAC CATHETERIZATION N/A 01/22/2016   Procedure: Left Heart Cath and Coronary Angiography;  Surgeon: Peter M Swaziland, MD;  Location: [facility] INVASIVE CV LAB;  Service: Cardiovascular;  Laterality: N/A;   CARDIAC CATHETERIZATION N/A 01/22/2016   Procedure: Coronary Stent Intervention;  Surgeon: Peter M Swaziland, MD;  Location: [facility] INVASIVE CV LAB;  Service: Cardiovascular;  Laterality: N/A;   CAROTID STENT Left 04/2003   small left parietal and left hemispheric CVA/notes 07/29/2010   CHOLECYSTECTOMY OPEN     COLONOSCOPY  2011   COLONOSCOPY N/A 05/10/2019   Procedure: COLONOSCOPY;  Surgeon: Malissa Hippo, MD;  [facility];  Service: Endoscopy;  Laterality: N/A;  1:45   COLONOSCOPY WITH PROPOFOL N/A 09/01/2022   Procedure: COLONOSCOPY WITH PROPOFOL;  Surgeon: Marguerita Merles, Reuel Boom, MD;  [facility];  Service: Gastroenterology;  Laterality: N/A;  1:45PM;ASA 3   CORONARY ATHERECTOMY N/A 12/29/2019   Procedure: CORONARY ATHERECTOMY;  Surgeon: Corky Crafts, MD;  Location: [facility] INVASIVE CV LAB;  Service: Cardiovascular;  Laterality: N/A;  Prox RCA   CORONARY STENT INTERVENTION N/A 12/29/2019   Procedure: CORONARY STENT INTERVENTION;  Surgeon: Corky Crafts, MD;  Location: [facility] INVASIVE CV LAB;  Service: Cardiovascular;  Laterality: N/A;  Prox RCA   CORONARY ULTRASOUND/IVUS N/A 12/29/2019   Procedure: Intravascular Ultrasound/IVUS;  Surgeon: Corky Crafts, MD;  Location: [facility] INVASIVE CV LAB;  Service:  Cardiovascular;  Laterality: N/A;   ESOPHAGEAL DILATION N/A 04/28/2022   Procedure: ESOPHAGEAL DILATION;  Surgeon: Dolores Frame, MD;  [facility];  Service: Gastroenterology;  Laterality: N/A;   ESOPHAGOGASTRODUODENOSCOPY  08/06/2011   Procedure: ESOPHAGOGASTRODUODENOSCOPY (EGD);  Surgeon: Malissa Hippo, MD;  [facility];  Service: Endoscopy;  Laterality: N/A;   ESOPHAGOGASTRODUODENOSCOPY N/A 05/10/2019   Procedure: ESOPHAGOGASTRODUODENOSCOPY (EGD);  Surgeon: Malissa Hippo, MD;  [facility];  Service: Endoscopy;  Laterality: N/A;   ESOPHAGOGASTRODUODENOSCOPY (EGD) WITH PROPOFOL N/A 04/28/2022   Procedure: ESOPHAGOGASTRODUODENOSCOPY (EGD) WITH PROPOFOL;  Surgeon: Dolores Frame, MD;  [facility];  Service: Gastroenterology;  Laterality: N/A;  10:30 am, pt can't move up due to transportation   FLEXIBLE SIGMOIDOSCOPY  08/06/2011   Procedure: FLEXIBLE SIGMOIDOSCOPY;  Surgeon: Malissa Hippo, MD;  [facility];  Service: Endoscopy;  Laterality: N/A;   HEMOSTASIS CLIP PLACEMENT  09/01/2022   Procedure: HEMOSTASIS CLIP PLACEMENT;  Surgeon: Dolores Frame, MD;  [facility];  Service: Gastroenterology;;   INGUINAL HERNIA REPAIR Left 1970s   IR ANGIO INTRA EXTRACRAN SEL COM CAROTID INNOMINATE BILAT MOD SED  11/01/2020   IR ANGIO VERTEBRAL SEL VERTEBRAL BILAT MOD SED  11/01/2020   IR RADIOLOGIST EVAL & MGMT  09/23/2020   IR US GUIDE VASC ACCESS RIGHT  11/01/2020   LARYNX SURGERY  1970s   Polyps excised   LEFT HEART CATH AND CORONARY ANGIOGRAPHY N/A 12/29/2019   Procedure: LEFT HEART CATH AND CORONARY ANGIOGRAPHY;  Surgeon: Corky Crafts, MD;  Location: [facility]VASIVE CV LAB;  Service: Cardiovascular;  Laterality: N/A;   LEFT HEART CATH AND CORONARY ANGIOGRAPHY N/A 10/12/2022   Procedure: LEFT HEART CATH AND CORONARY ANGIOGRAPHY;  Surgeon: Corky Crafts, MD;  Location: [facility] INVAS[facility] CV LAB;   Service: Cardiovascular;  Laterality: N/A;   MALONEY DILATION  05/10/2019   Procedure: [name] DILATION;  Surgeon: Malissa Hippo, MD;  [facility];  Service: Endoscopy;;   MELANOMA EXCISION Left ~ 2016   forearm   POLYPECTOMY  05/10/2019   Procedure: POLYPECTOMY;  Surgeon: Malissa Hippo, MD;  [facility];  Service: Endoscopy;;  colon   POLYPECTOMY  09/01/2022   Procedure: POLYPECTOMY INTESTINAL;  Surgeon: Dolores Frame, MD;  [facility];  Service: Gastroenterology;;   SPLENECTOMY, 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   spontaneous rupture   SUBMUCOSAL LIFTING INJECTION  09/01/2022   Procedure: SUBMUCOSAL LIFTING INJECTION;  Surgeon: Dolores Frame, MD;  [facility];  Service: Gastroenterology;;   SUBMUCOSAL TATTOO INJECTION  09/01/2022   Procedure: SUBMUCOSAL TATTOO INJECTION;  Surgeon: Dolores Frame, MD;  [facility];  Service: Gastroenterology;;   TEMPORARY PACEMAKER N/A 12/29/2019   Procedure: TEMPORARY PACEMAKER;  Surgeon: Corky Crafts, MD;  Location: [facility]  INVASIVE CV LAB;  Service: Cardiovascular;  Laterality: N/A;    Home Medications:  Allergies as of 02/03/2023       Reactions   Gabapentin Nausea And Vomiting   Other Reaction(s): Not available   Prednisone Palpitations   Other Reaction(s): Not available   Carvedilol    Stopped due to bradycardia   Clonidine Derivatives    Stopped due to bradycardia   Diltiazem    Stopped due to bradycardia   Tape Other (See Comments)   Causes blisters to form   Latex Other (See Comments), Rash   Blisters Other Reaction(s): Not available        Medication List        Accurate as of February 03, 2023  1:23 PM. If you have any questions, ask your nurse or doctor.          albuterol (2.5 MG/3ML) 0.083% nebulizer solution Commonly known as: PROVENTIL Take 2.5 mg by nebulization every 6 (six) hours as needed for wheezing or shortness of breath.    albuterol 108 (90 Base) MCG/ACT inhaler Commonly known as: VENTOLIN HFA Inhale 2 puffs into the lungs every 4 (four) hours as needed for wheezing or shortness of breath.   ALLERGY EYE DROPS OP Place 1 drop into both eyes daily as needed (allergies).   amLODipine 5 MG tablet Commonly known as: NORVASC Take 1 tablet (5 mg total) by mouth daily.   aspirin EC 81 MG tablet Take 1 tablet (81 mg total) by mouth daily.   aspirin-acetaminophen-caffeine 250-250-65 MG tablet Commonly known as: EXCEDRIN MIGRAINE Take 1 tablet by mouth daily as needed for headache.   azaTHIOprine 50 MG tablet Commonly known as: IMURAN Take 1 tablet (50 mg total) by mouth daily.   carbamazepine 100 MG 12 hr capsule Commonly known as: CARBATROL TAKE ONE CAPSULE BY MOUTH 2 TIMES A DAY   cephALEXin 500 MG capsule Commonly known as: KEFLEX Take 1 capsule (500 mg total) by mouth 3 (three) times daily for 7 days.   cetirizine 10 MG tablet Commonly known as: ZYRTEC Take 10 mg by mouth daily.   clopidogrel 75 MG tablet Commonly known as: PLAVIX Take 1 tablet (75 mg total) by mouth daily.   clorazepate 3.75 MG tablet Commonly known as: TRANXENE Take 1 tablet (3.75 mg total) by mouth 2 (two) times daily as needed for anxiety.   cyclobenzaprine 5 MG tablet Commonly known as: FLEXERIL Take 1 tablet (5 mg total) by mouth at bedtime as needed for muscle spasms.   famotidine 20 MG tablet Commonly known as: PEPCID Take 1 tablet (20 mg total) by mouth at bedtime.   fluticasone 50 MCG/ACT nasal spray Commonly known as: FLONASE Place 2 sprays into both nostrils as needed for allergies.   furosemide 40 MG tablet Commonly known as: LASIX Take 1 tablet (40 mg total) by mouth daily as needed for fluid (leg/hand swelling.).   HYDROcodone-acetaminophen 10-325 MG tablet Commonly known as: NORCO Take 1 tablet by mouth every 6 (six) hours as needed for moderate pain (pain score 4-6). Start taking on: February 10, 2023   isosorbide mononitrate 120 MG 24 hr tablet Commonly known as: IMDUR Take 1 tablet (120 mg total) by mouth daily.   Jardiance 10 MG Tabs tablet Generic drug: empagliflozin Take 10 mg by mouth every morning.   levothyroxine 50 MCG tablet Commonly known as: SYNTHROID Take 50 mcg by mouth daily before breakfast.   LORazepam 0.5 MG tablet Commonly known as: Ativan Take 1-2  pills 30 minutes prior to MRI   losartan 25 MG tablet Commonly known as: COZAAR Take 1 tablet (25 mg total) by mouth daily.   naloxone 4 MG/0.1ML Liqd nasal spray kit Commonly known as: NARCAN In case of opiod overdose   nitroGLYCERIN 0.4 MG SL tablet Commonly known as: NITROSTAT Place 0.4 mg under the tongue every 5 (five) minutes as needed for chest pain.   ondansetron 4 MG tablet Commonly known as: ZOFRAN Take 1 tablet (4 mg total) by mouth 3 (three) times daily as needed.   pantoprazole 40 MG tablet Commonly known as: PROTONIX TAKE 1 TABLET BY MOUTH ONCE A DAY.   penicillin v potassium 250 MG tablet Commonly known as: VEETID Take 1 tablet (250 mg total) by mouth in the morning and at bedtime for 7 days.   potassium chloride SA 20 MEQ tablet Commonly known as: KLOR-CON M Take 1 tablet (20 mEq total) by mouth daily as needed (Take only when you take Lasix).   rosuvastatin 40 MG tablet Commonly known as: CRESTOR Take 1 tablet (40 mg total) by mouth daily.   sertraline 25 MG tablet Commonly known as: ZOLOFT Take 1 tablet (25 mg total) by mouth daily.   tamsulosin 0.4 MG Caps capsule Commonly known as: FLOMAX Take 0.4 mg by mouth daily. For Kidney Stones   ursodiol 500 MG tablet Commonly known as: ACTIGALL Take 1 tablet (500 mg total) by mouth 2 (two) times daily.   Vitamin D3 Super Strength 50 MCG (2000 UT) Caps Generic drug: Cholecalciferol Take 2,000 Units by mouth daily.        Allergies:  Allergies  Allergen Reactions   Gabapentin Nausea And Vomiting    Other  Reaction(s): Not available   Prednisone Palpitations    Other Reaction(s): Not available   Carvedilol     Stopped due to bradycardia   Clonidine Derivatives     Stopped due to bradycardia    Diltiazem     Stopped due to bradycardia    Tape Other (See Comments)    Causes blisters to form   Latex Other (See Comments) and Rash    Blisters  Other Reaction(s): Not available    Family History: Family History  Problem Relation Age of Onset   Heart failure Mother    Cancer Father    Heart failure Father    Cancer Sister    Dementia Sister    Heart disease Other    Arthritis Other    Cancer Other    Diabetes Other    Kidney disease Other    Cancer Sister    Heart failure Brother     Social History:  reports that she has been smoking cigarettes. She has a 20 pack-year smoking history. She has been exposed to tobacco smoke. She has never used smokeless tobacco. She reports that she does not currently use alcohol. She reports that she does not use drugs.  ROS: All other review of systems were reviewed and are negative except what is noted above in HPI  Physical Exam: BP 115/73   Pulse 94   Constitutional:  Alert and oriented, No acute distress. HEENT:  AT, moist mucus membranes.  Trachea midline, no masses. Cardiovascular: No clubbing, cyanosis, or edema. Respiratory: Normal respiratory effort, no increased work of breathing. GI: Abdomen is soft, nontender, nondistended, no abdominal masses GU: No CVA tenderness.  Lymph: No cervical or inguinal lymphadenopathy. Skin: No rashes, bruises or suspicious lesions. Neurologic: Grossly intact, no focal deficits, moving  all 4 extremities. Psychiatric: Normal mood and affect.  Laboratory Data: Lab Results  Component Value Date   WBC 9.0 01/31/2023   HGB 15.7 (H) 01/31/2023   HCT 47.4 (H) 01/31/2023   MCV 88.6 01/31/2023   PLT 336 01/31/2023    Lab Results  Component Value Date   CREATININE 0.70 01/31/2023    No  results found for: "PSA"  No results found for: "TESTOSTERONE"  Lab Results  Component Value Date   HGBA1C 7.1 (H) 10/19/2022    Urinalysis    Component Value Date/Time   COLORURINE RED (A) 02/02/2023 1156   APPEARANCEUR TURBID (A) 02/02/2023 1156   APPEARANCEUR Cloudy (A) 01/21/2023 1015   LABSPEC  02/02/2023 1156    TEST NOT REPORTED DUE TO COLOR INTERFERENCE OF URINE PIGMENT   PHURINE  02/02/2023 1156    TEST NOT REPORTED DUE TO COLOR INTERFERENCE OF URINE PIGMENT   GLUCOSEU (A) 02/02/2023 1156    TEST NOT REPORTED DUE TO COLOR INTERFERENCE OF URINE PIGMENT   HGBUR (A) 02/02/2023 1156    TEST NOT REPORTED DUE TO COLOR INTERFERENCE OF URINE PIGMENT   BILIRUBINUR (A) 02/02/2023 1156    TEST NOT REPORTED DUE TO COLOR INTERFERENCE OF URINE PIGMENT   BILIRUBINUR 2+ (A) 01/21/2023 1015   KETONESUR (A) 02/02/2023 1156    TEST NOT REPORTED DUE TO COLOR INTERFERENCE OF URINE PIGMENT   PROTEINUR (A) 02/02/2023 1156    TEST NOT REPORTED DUE TO COLOR INTERFERENCE OF URINE PIGMENT   UROBILINOGEN 0.2 11/03/2014 1722   NITRITE (A) 02/02/2023 1156    TEST NOT REPORTED DUE TO COLOR INTERFERENCE OF URINE PIGMENT   LEUKOCYTESUR (A) 02/02/2023 1156    TEST NOT REPORTED DUE TO COLOR INTERFERENCE OF URINE PIGMENT    Lab Results  Component Value Date   LABMICR 4.5 10/19/2022   BACTERIA FEW (A) 02/02/2023    Pertinent Imaging: CT stone study 01/31/2023: Images reviewed and discussed with the patient  No results found for this or any previous visit.  Results for orders placed during the hospital encounter of 10/05/07  US VenoUS Imaging Bilateral  Narrative Clinical Data: Left leg pain and swelling  VENOUS DUPLEX ULTRASOUND OF BILATERAL LOWER EXTREMITIES  Technique: Gray-scale sonography with graded compression, as well as color Doppler and duplex ultrasound, were performed to evaluate the deep venous system of both lower extremities from the level of the common femoral vein  through the popliteal and proximal calf veins. Spectral Doppler was utilized to evaulate flow at rest and with distal augmentation maneuvers. Confirmed with physician's office that bilateral lower extremity exam requested.  Comparison: None  Findings: Deep venous system patent and compressible from the groin through popliteal fossa bilaterally.Marland Kitchen Spontaneous venous flow present with intact augmentation and evidence of respiratory phasicity. No intraluminal thrombus identified. Visualized portions of the greater saphenous systems unremarkable.  IMPRESSION: No evidence of deep venous thrombosis.  Provider: Morley Kos  No results found for this or any previous visit.  No results found for this or any previous visit.  Results for orders placed during the hospital encounter of 02/02/23  US Renal  Narrative CLINICAL DATA:  Painless hematuria  EXAM: RENAL / URINARY TRACT ULTRASOUND COMPLETE  COMPARISON:  CT renal stone 01/31/2023  FINDINGS: Right Kidney:  Renal measurements: 12.7 x 4.4 x 5.7 cm = volume: 161 mL. Echogenicity within normal limits. No mass or hydronephrosis visualized.  Left Kidney:  Renal measurements: 11.5 x 6.4 x 5.3 cm = volume: 205 mL. Echogenicity  within normal limits. No mass or hydronephrosis visualized.  Bladder:  Under distended and not well evaluated.  Other:  None.  IMPRESSION: Normal renal ultrasound.   Electronically Signed By: Darliss Cheney M.D. On: 02/02/2023 18:00  No valid procedures specified. No results found for this or any previous visit.  Results for orders placed during the hospital encounter of 01/31/23  CT Renal Stone Study  Narrative CLINICAL DATA:  Lower abdominal and flank pain.  Gross hematuria.  EXAM: CT ABDOMEN AND PELVIS WITHOUT CONTRAST  TECHNIQUE: Multidetector CT imaging of the abdomen and pelvis was performed following the standard protocol without IV contrast.  RADIATION DOSE  REDUCTION: This exam was performed according to the departmental dose-optimization program which includes automated exposure control, adjustment of the mA and/or kV according to patient size and/or use of iterative reconstruction technique.  COMPARISON:  08/04/2022  FINDINGS: Lower chest: Pulmonary nodule is seen in the medial right lower lobe, which is incompletely visualized but measures at least 13 mm in diameter. A few other tiny less than 5 mm pulmonary nodules also seen in the right lower lobe.  Hepatobiliary: No mass visualized on this unenhanced exam. Prior cholecystectomy again noted. Dilated common bile duct is unchanged, and without significant intrahepatic biliary ductal dilatation.  Pancreas: No mass or inflammatory process visualized on this unenhanced exam.  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  Adrenals/Urinary tract: No evidence of urolithiasis or hydronephrosis. Unremarkable unopacified urinary bladder.  Stomach/Bowel: No evidence of obstruction, inflammatory process, or abnormal fluid collections. Diverticulosis is seen mainly involving the sigmoid colon, however there is no evidence of diverticulitis.  Vascular/Lymphatic: No pathologically enlarged lymph nodes identified. No evidence of abdominal aortic aneurysm.  Reproductive: Prior hysterectomy noted. Adnexal regions are unremarkable in appearance.  Other:  None.  Musculoskeletal:  No suspicious bone lesions identified.  IMPRESSION: No evidence of urolithiasis, hydronephrosis, or other acute findings.  Colonic diverticulosis, without radiographic evidence of diverticulitis.  Several right lower lobe nodules, largest is incompletely visualized but measures at least 13 mm. Chest CT is recommended for further evaluation.   Electronically Signed By: Danae Orleans M.D. On: 01/31/2023 17:03   Assessment & Plan:    1. Gross  hematuria -Urine cytology  -CT hematuria -office cystoscopy - Urinalysis, Routine w reflex microscopic   No follow-ups on file.  Wilkie Aye, MD  [facility] [place]

## 2023-02-03 NOTE — Telephone Encounter (Signed)
Spoke to patient, doctor in [place] ordered xray , and patient was advised that office would call her with the results

## 2023-02-04 ENCOUNTER — Encounter: Payer: 59 | Admitting: Obstetrics & Gynecology

## 2023-02-04 ENCOUNTER — Telehealth: Payer: Self-pay

## 2023-02-04 ENCOUNTER — Telehealth (HOSPITAL_COMMUNITY): Payer: 59 | Admitting: Psychiatry

## 2023-02-04 NOTE — Telephone Encounter (Signed)
[name] called for her hand x-ray results. Call back phone [phone].

## 2023-02-05 LAB — CYTOLOGY, URINE

## 2023-02-05 LAB — TOXASSURE SELECT,+ANTIDEPR,UR

## 2023-02-09 ENCOUNTER — Encounter (HOSPITAL_COMMUNITY): Payer: 59 | Admitting: Physical Therapy

## 2023-02-09 NOTE — Telephone Encounter (Signed)
Patient call back for hand xray results. She has been advised the report will be reviewed on her next appointment on 112/16/2024. If anything urgent you will call back ASAP. Please advise.

## 2023-02-09 NOTE — Telephone Encounter (Signed)
Patient has called several times for the results

## 2023-02-13 ENCOUNTER — Ambulatory Visit (HOSPITAL_BASED_OUTPATIENT_CLINIC_OR_DEPARTMENT_OTHER)
Admission: RE | Admit: 2023-02-13 | Discharge: 2023-02-13 | Disposition: A | Discharge status: Home / Self Care | Payer: 59 | Source: Ambulatory Visit | Attending: Urology | Admitting: Urology

## 2023-02-13 DIAGNOSIS — R310 Gross hematuria: Secondary | ICD-10-CM | POA: Insufficient documentation

## 2023-02-13 MED ORDER — IOHEXOL 300 MG/ML  SOLN
100.0000 mL | Freq: Once | INTRAMUSCULAR | Status: AC | PRN
  Administered 2023-02-13: 100 mL via INTRAVENOUS

## 2023-02-15 ENCOUNTER — Other Ambulatory Visit: Payer: Self-pay

## 2023-02-15 ENCOUNTER — Encounter (HOSPITAL_COMMUNITY): Payer: Self-pay | Admitting: *Deleted

## 2023-02-15 ENCOUNTER — Emergency Department (HOSPITAL_COMMUNITY)
Admission: EM | Admit: 2023-02-15 | Discharge: 2023-02-15 | Discharge status: Against Medical Advice | Payer: 59 | Attending: Emergency Medicine | Admitting: Emergency Medicine

## 2023-02-15 ENCOUNTER — Other Ambulatory Visit: Payer: Self-pay | Admitting: Cardiology

## 2023-02-15 ENCOUNTER — Telehealth: Payer: Self-pay | Admitting: Urology

## 2023-02-15 ENCOUNTER — Telehealth: Payer: Self-pay | Admitting: Internal Medicine

## 2023-02-15 DIAGNOSIS — R310 Gross hematuria: Secondary | ICD-10-CM

## 2023-02-15 DIAGNOSIS — N939 Abnormal uterine and vaginal bleeding, unspecified: Secondary | ICD-10-CM | POA: Insufficient documentation

## 2023-02-15 DIAGNOSIS — Z5321 Procedure and treatment not carried out due to patient leaving prior to being seen by health care provider: Secondary | ICD-10-CM | POA: Insufficient documentation

## 2023-02-15 LAB — BASIC METABOLIC PANEL
Anion gap: 10 (ref 5–15)
BUN: 11 mg/dL (ref 8–23)
CO2: 27 mmol/L (ref 22–32)
Calcium: 9.6 mg/dL (ref 8.9–10.3)
Chloride: 103 mmol/L (ref 98–111)
Creatinine, Ser: 0.66 mg/dL (ref 0.44–1.00)
GFR, Estimated: >60 mL/min (ref >60)
Glucose, Bld: 124 mg/dL — ABNORMAL HIGH (ref 70–99)
Potassium: 3.2 mmol/L — ABNORMAL LOW (ref 3.5–5.1)
Sodium: 140 mmol/L (ref 135–145)

## 2023-02-15 LAB — CBC WITH DIFFERENTIAL/PLATELET
Abs Immature Granulocytes: 0.03 10*3/uL (ref 0.00–0.07)
Basophils Absolute: 0.1 10*3/uL (ref 0.0–0.1)
Basophils Relative: 1 %
Eosinophils Absolute: 0.2 10*3/uL (ref 0.0–0.5)
Eosinophils Relative: 3 %
HCT: 45.6 % (ref 36.0–46.0)
Hemoglobin: 15.4 g/dL — ABNORMAL HIGH (ref 12.0–15.0)
Immature Granulocytes: 0 %
Lymphocytes Relative: 26 %
Lymphs Abs: 2.5 10*3/uL (ref 0.7–4.0)
MCH: 29.7 pg (ref 26.0–34.0)
MCHC: 33.8 g/dL (ref 30.0–36.0)
MCV: 88 fL (ref 80.0–100.0)
Monocytes Absolute: 1.1 10*3/uL — ABNORMAL HIGH (ref 0.1–1.0)
Monocytes Relative: 12 %
Neutro Abs: 5.8 10*3/uL (ref 1.7–7.7)
Neutrophils Relative %: 58 %
Platelets: 361 10*3/uL (ref 150–400)
RBC: 5.18 MIL/uL — ABNORMAL HIGH (ref 3.87–5.11)
RDW: 14 % (ref 11.5–15.5)
WBC: 9.7 10*3/uL (ref 4.0–10.5)
nRBC: 0 % (ref 0.0–0.2)

## 2023-02-15 LAB — CBG MONITORING, ED: Glucose-Capillary: 109 mg/dL — ABNORMAL HIGH (ref 70–99)

## 2023-02-15 NOTE — Telephone Encounter (Signed)
Pt went for MRI Saturday and she is still filling the toilet with blood. She wants a call to advise what she can do. [phone]

## 2023-02-15 NOTE — Telephone Encounter (Signed)
Patient came by the office needs a hemoglobin blood work done to check it. Was in the ER on 11.17.2024. still a lot of blood in urine.  can this be ordered call patient at [phone] to let her know either way. Tried to schedule her ER follow up she said no all I need is my hemoglobin check by doing blood work.

## 2023-02-15 NOTE — Telephone Encounter (Signed)
Pt would like an order to get hemoglobin checked. She thinks she is losing too much blood.

## 2023-02-15 NOTE — Telephone Encounter (Signed)
Patient scheduled for appt next week

## 2023-02-15 NOTE — ED Triage Notes (Signed)
Pt with continued vaginal bleeding. Pt states this started three months ago.  Pt does not have a GYN and has had hysterectomy.

## 2023-02-15 NOTE — Telephone Encounter (Signed)
Patient cysto appointment moved up to next week. Patient advised if she continues to loose large amounts of blood to go to nearest ER. Patient voiced understanding.

## 2023-02-16 NOTE — Telephone Encounter (Signed)
Patient called with no answer, message left to return call to office.

## 2023-02-17 ENCOUNTER — Telehealth: Payer: Self-pay

## 2023-02-17 NOTE — Telephone Encounter (Signed)
Patient requesting results from recent imaging. Leave a voicemail if no answer.

## 2023-02-17 NOTE — Telephone Encounter (Signed)
Patient returned call to [facility]

## 2023-02-17 NOTE — Telephone Encounter (Signed)
Patient requesting imaging results

## 2023-02-17 NOTE — Telephone Encounter (Signed)
Open in error

## 2023-02-18 ENCOUNTER — Encounter (HOSPITAL_COMMUNITY): Payer: Self-pay | Admitting: Psychiatry

## 2023-02-18 ENCOUNTER — Telehealth: Payer: Self-pay | Admitting: Urology

## 2023-02-18 ENCOUNTER — Telehealth (HOSPITAL_COMMUNITY): Payer: 59 | Admitting: Psychiatry

## 2023-02-18 DIAGNOSIS — Z79891 Long term (current) use of opiate analgesic: Secondary | ICD-10-CM

## 2023-02-18 DIAGNOSIS — F410 Panic disorder [episodic paroxysmal anxiety] without agoraphobia: Secondary | ICD-10-CM

## 2023-02-18 DIAGNOSIS — F5104 Psychophysiologic insomnia: Secondary | ICD-10-CM

## 2023-02-18 DIAGNOSIS — F411 Generalized anxiety disorder: Secondary | ICD-10-CM

## 2023-02-18 DIAGNOSIS — F331 Major depressive disorder, recurrent, moderate: Secondary | ICD-10-CM | POA: Diagnosis not present

## 2023-02-18 DIAGNOSIS — G894 Chronic pain syndrome: Secondary | ICD-10-CM

## 2023-02-18 DIAGNOSIS — Z79899 Other long term (current) drug therapy: Secondary | ICD-10-CM

## 2023-02-18 MED ORDER — CLORAZEPATE DIPOTASSIUM 3.75 MG PO TABS: 3.7500 mg | ORAL_TABLET | Freq: Every day | ORAL | 0 refills | Status: DC | PRN

## 2023-02-18 NOTE — Progress Notes (Signed)
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 patient was advised to call back or seek an in-person evaluation if the symptoms worsen or if the condition fails to improve as anticipated.  I provided 30 minutes dedicated to the care of this patient via video on the date of this encounter to include chart review,  face-to-face time with the patient, medication management/counseling, coordination of care with primary care provider.  Elsie Lincoln, MD 12/5/202411:45 AM

## 2023-02-18 NOTE — Patient Instructions (Signed)
We decreased the dose of the clorazepate to 3.75 mg daily as needed for panic today.  If you have been filling the alprazolam (Xanax) prescription I would recommend returning that medication as it is not safe to mix benzodiazepines with each other or to mix them with opiate medication like hydrocodone.

## 2023-02-18 NOTE — Telephone Encounter (Signed)
Pt left message on answering service wanting lab/test results

## 2023-02-18 NOTE — Telephone Encounter (Signed)
Patient states she is aware of her lab results and would like to know her imaging results. Patient made aware that message has been sent to provider and she voiced understanding.

## 2023-02-22 ENCOUNTER — Telehealth: Payer: Self-pay

## 2023-02-22 ENCOUNTER — Ambulatory Visit (INDEPENDENT_AMBULATORY_CARE_PROVIDER_SITE_OTHER): Payer: 59 | Admitting: Urology

## 2023-02-22 ENCOUNTER — Encounter: Payer: Self-pay | Admitting: Urology

## 2023-02-22 ENCOUNTER — Ambulatory Visit: Payer: 59 | Admitting: Neurology

## 2023-02-22 VITALS — BP 165/94 | HR 79 | Ht 63.0 in | Wt 183.0 lb

## 2023-02-22 DIAGNOSIS — R310 Gross hematuria: Secondary | ICD-10-CM | POA: Diagnosis not present

## 2023-02-22 DIAGNOSIS — D494 Neoplasm of unspecified behavior of bladder: Secondary | ICD-10-CM

## 2023-02-22 LAB — URINALYSIS, ROUTINE W REFLEX MICROSCOPIC
Bilirubin, UA: NEGATIVE
Leukocytes,UA: NEGATIVE
Nitrite, UA: NEGATIVE
Specific Gravity, UA: 1.025 (ref 1.005–1.030)
Urobilinogen, Ur: 0.2 mg/dL (ref 0.2–1.0)
pH, UA: 6 (ref 5.0–7.5)

## 2023-02-22 LAB — MICROSCOPIC EXAMINATION: RBC, Urine: >30 /[HPF] — AB (ref 0–2)

## 2023-02-22 MED ORDER — CIPROFLOXACIN HCL 500 MG PO TABS
500.0000 mg | ORAL_TABLET | Freq: Once | ORAL | Status: AC
  Administered 2023-02-22: 500 mg via ORAL

## 2023-02-22 NOTE — Telephone Encounter (Signed)
Patient called for a Rx Hydrocodone 10-325 MG refill.   Two calls have been made to reach the patient with no answer. No voice ID.  She needs to call the pharmacy. The pharmacy stated there is a refill available.

## 2023-02-22 NOTE — Patient Instructions (Signed)
Transurethral Resection of Bladder Tumor  Transurethral resection of a bladder tumor is the removal (resection) of cancerous tissue (tumor) from the inside wall of the bladder. The bladder is the organ that holds urine. The tumor is removed through the tube that carries urine out of the body (urethra). In a transurethral resection, a thin telescope with a light, a tiny camera, and an electric cutting edge (resectoscope) is passed through the urethra. In men, the opening of the urethra is at the end of the penis. In women, it is just above the opening of the vagina. Tell a health care provider about: Any allergies you have. All medicines you are taking, including vitamins, herbs, eye drops, creams, and over-the-counter medicines. Any problems you or family members have had with anesthetic medicines. Any bleeding problems you have. Any surgeries you have had. Any medical conditions you have, including recent urinary tract infections. Whether you are pregnant or may be pregnant. What are the risks? Generally, this is a safe procedure. However, problems may occur, including: Infection. Bleeding. Allergic reactions to medicines. Damage to nearby structures or organs. Difficulty urinating from blockage of the urethra or not being able to urinate (urinary retention). Deep vein thrombosis. This is a blood clot that can develop in your leg. Recurring cancer. What happens before the procedure? When to stop eating and drinking Follow instructions from your health care provider about what you may eat and drink before your procedure. These may include: 8 hours before your procedure Stop eating most foods. Do not eat meat, fried foods, or fatty foods. Eat only light foods, such as toast or crackers. All liquids are okay except energy drinks and alcohol. 6 hours before your procedure Stop eating. Drink only clear liquids, such as water, clear fruit juice, black coffee, plain tea, and sports  drinks. Do not drink energy drinks or alcohol. 2 hours before your procedure Stop drinking all liquids. You may be allowed to take medicines with small sips of water. Medicines Ask your health care provider about: Changing or stopping your regular medicines. This is especially important if you are taking diabetes medicines or blood thinners. Taking medicines such as aspirin and ibuprofen. These medicines can thin your blood. Do not take these medicines unless your health care provider tells you to take them. Taking over-the-counter medicines, vitamins, herbs, and supplements. General instructions If you will be going home right after the procedure, plan to have a responsible adult: Take you home from the hospital or clinic. You will not be allowed to drive. Care for you for the time you are told. Ask your health care provider what steps will be taken to help prevent infection. These steps may include: Washing skin with a germ-killing soap. Taking antibiotic medicine. Do not use any products that contain nicotine or tobacco for at least 4 weeks before the procedure. These products include cigarettes, chewing tobacco, and vaping devices, such as e-cigarettes. If you need help quitting, ask your health care provider. What happens during the procedure? An IV will be inserted into one of your veins. You will be given one or more of the following: A medicine to help you relax (sedative). A medicine that is injected into your spine to numb the area below and slightly above the injection site (spinal anesthetic). A medicine that is injected into an area of your body to numb everything below the injection site (regional anesthetic). A medicine to make you fall asleep (general anesthetic). Your legs will be placed  in foot rests (stirrups) to open your legs and bend your knees. The resectoscope will be passed through your urethra and into your bladder. The part of your bladder with the tumor will be  resected by the cutting edge of the resectoscope. Fluid will be passed to rinse out the cut tissues (irrigation). The resectoscope will then be taken out. A small, thin tube (catheter) will be passed through your urethra and into your bladder. The catheter will drain urine into a bag outside of your body. The procedure may vary among health care providers and hospitals. What happens after the procedure? Your blood pressure, heart rate, breathing rate, and blood oxygen level will be monitored until you leave the hospital or clinic. You may continue to receive fluids and medicines through an IV. You will be given pain medicine to relieve pain. You will have a catheter to drain your urine. The amount of urine will be measured. If you have blood in your urine, your bladder may be rinsed out by passing fluid through your catheter. You will be encouraged to walk as soon as you can. You may have to wear compression stockings. These stockings help to prevent blood clots and reduce swelling in your legs. If you were given a sedative during the procedure, it can affect you for several hours. Do not drive or operate machinery until your health care provider says that it is safe. Summary Transurethral resection of a bladder tumor is the removal (resection) of a cancerous growth (tumor) on the inside wall of the bladder. To do this procedure, your health care provider uses a thin telescope with a light, a tiny camera, and an electric cutting edge (resectoscope) that is guided to your bladder through your urethra. The part of your bladder that is affected by the tumor will be resected by the cutting edge of the resectoscope. A catheter will be passed through your urethra and into your bladder. The catheter will drain urine into a bag outside of your body. If you will be going home right after the procedure, plan to have a responsible adult take you home from the hospital or clinic. You will not be allowed to  drive. This information is not intended to replace advice given to you by your health care provider. Make sure you discuss any questions you have with your health care provider. Document Revised: 03/07/2021 Document Reviewed: 03/07/2021 Elsevier Patient Education  2024 [employer].

## 2023-02-22 NOTE — Telephone Encounter (Signed)
Patient called back and she has been notified.

## 2023-02-22 NOTE — Progress Notes (Signed)
   02/22/23  CC: gross hematuria   HPI: Ms Westlake is a 72yo here for cystoscopy for gross hematuria. Ct hematuria dhows likely right lateral wall tumor and urine cytology was positive Blood pressure (!) 165/94, pulse 79, height 5\' 3"  (1.6 m), weight 183 lb (83 kg). NED. A&Ox3.   No respiratory distress   Abd soft, NT, ND Normal external genitalia with patent urethral meatus  Cystoscopy Procedure Note  Patient identification was confirmed, informed consent was obtained, and patient was prepped using Betadine solution.  Lidocaine jelly was administered per urethral meatus.    Procedure: - Flexible cystoscope introduced, without any difficulty.   - Thorough search of the bladder revealed:    normal urethral meatus    normal urothelium    no stones    no ulcers     5cm sessile right posterior and dome bladder tumor    no urethral polyps    no trabeculation  - Ureteral orifices were normal in position and appearance.  Post-Procedure: - Patient tolerated the procedure well  Assessment/ Plan: The risks/benefits/alternatives to transurethral resection of a bladder tumor and gemcitabine instillation was explained to the patient and she understands and wishes to proceed with surgery  No follow-ups on file.  Wilkie Aye, MD

## 2023-02-23 ENCOUNTER — Telehealth: Payer: Self-pay | Admitting: Urology

## 2023-02-23 NOTE — Telephone Encounter (Signed)
Patient states she has been bleeding since she left office yesterday and she has began to feel weak. Patient informed that she needs to go to the ER. Patient voiced understanding.

## 2023-02-23 NOTE — Telephone Encounter (Signed)
Pt says she is bleeding more than she was and it is scaring her. She would like a call back asap

## 2023-02-24 ENCOUNTER — Emergency Department (HOSPITAL_COMMUNITY)
Admission: EM | Admit: 2023-02-24 | Discharge: 2023-02-24 | Disposition: A | Discharge status: Home / Self Care | Payer: 59

## 2023-02-24 ENCOUNTER — Encounter (HOSPITAL_COMMUNITY): Payer: Self-pay | Admitting: *Deleted

## 2023-02-24 ENCOUNTER — Other Ambulatory Visit: Payer: Self-pay

## 2023-02-24 DIAGNOSIS — Z8551 Personal history of malignant neoplasm of bladder: Secondary | ICD-10-CM | POA: Insufficient documentation

## 2023-02-24 DIAGNOSIS — Z7902 Long term (current) use of antithrombotics/antiplatelets: Secondary | ICD-10-CM | POA: Insufficient documentation

## 2023-02-24 DIAGNOSIS — N939 Abnormal uterine and vaginal bleeding, unspecified: Secondary | ICD-10-CM | POA: Diagnosis not present

## 2023-02-24 DIAGNOSIS — R42 Dizziness and giddiness: Secondary | ICD-10-CM | POA: Insufficient documentation

## 2023-02-24 DIAGNOSIS — Z91040 Latex allergy status: Secondary | ICD-10-CM | POA: Diagnosis not present

## 2023-02-24 DIAGNOSIS — Z7982 Long term (current) use of aspirin: Secondary | ICD-10-CM | POA: Diagnosis not present

## 2023-02-24 LAB — CBC
HCT: 41.7 % (ref 36.0–46.0)
Hemoglobin: 13.5 g/dL (ref 12.0–15.0)
MCH: 29.3 pg (ref 26.0–34.0)
MCHC: 32.4 g/dL (ref 30.0–36.0)
MCV: 90.5 fL (ref 80.0–100.0)
Platelets: 312 10*3/uL (ref 150–400)
RBC: 4.61 MIL/uL (ref 3.87–5.11)
RDW: 13.6 % (ref 11.5–15.5)
WBC: 12.8 10*3/uL — ABNORMAL HIGH (ref 4.0–10.5)
nRBC: 0 % (ref 0.0–0.2)

## 2023-02-24 LAB — BASIC METABOLIC PANEL
Anion gap: 8 (ref 5–15)
BUN: 11 mg/dL (ref 8–23)
CO2: 31 mmol/L (ref 22–32)
Calcium: 9.6 mg/dL (ref 8.9–10.3)
Chloride: 100 mmol/L (ref 98–111)
Creatinine, Ser: 0.63 mg/dL (ref 0.44–1.00)
GFR, Estimated: >60 mL/min (ref >60)
Glucose, Bld: 119 mg/dL — ABNORMAL HIGH (ref 70–99)
Potassium: 3.5 mmol/L (ref 3.5–5.1)
Sodium: 139 mmol/L (ref 135–145)

## 2023-02-24 LAB — D-DIMER, QUANTITATIVE: D-Dimer, Quant: 0.38 ug{FEU}/mL (ref 0.00–0.50)

## 2023-02-24 NOTE — ED Triage Notes (Signed)
Pt with vaginal bleeding since Monday.  Pt recently dx'd with bladder CA. C/o dizziness at work.

## 2023-02-24 NOTE — Discharge Instructions (Signed)
Your workup today was reassuring.  Your hemoglobin is stable.  The test for blood clots was also negative.  Please drink plenty of fluids.  Return to the ER if you are unable to urinate or develop any other concerning symptoms.

## 2023-02-24 NOTE — ED Notes (Signed)
Pt refusing IV at this time.

## 2023-02-24 NOTE — ED Provider Notes (Signed)
[place] EMERGENCY DEPARTMENT AT [facility] Provider Note   CSN: [id number] Arrival date & time: 02/24/23  1836     History  Chief Complaint  Patient presents with   Vaginal Bleeding    [name] is a 72 y.o. female.  72 year old female with past medical history of recently diagnosed bladder cancer who is on Plavix presenting to the emergency department today with dizziness.  The patient had an episode of dizziness at work today.  She has been having persistent hematuria over the past few weeks.  She was recently diagnosed with bladder cancer and the plan is for tumor resection in January.  She called her urologist and was told to come to the ER for further evaluation today.  She denies any associated chest pain or shortness of breath.  She is able to void fully and denies any obstruction or large clots.   Vaginal Bleeding      Home Medications Prior to Admission medications   Medication Sig Start Date End Date Taking? Authorizing Provider  albuterol (PROVENTIL HFA;VENTOLIN HFA) 108 (90 Base) MCG/ACT inhaler Inhale 2 puffs into the lungs every 4 (four) hours as needed for wheezing or shortness of breath.   Yes [provider]  amLODipine (NORVASC) 5 MG tablet TAKE ONE TABLET BY MOUTH EVERY DAY 02/15/23  Yes Branch, Dorothe Pea, MD  aspirin EC 81 MG tablet Take 1 tablet (81 mg total) by mouth daily. 05/11/19  Yes Rehman, Joline Maxcy, MD  azaTHIOprine (IMURAN) 50 MG tablet Take 1 tablet (50 mg total) by mouth daily. 12/07/22  Yes Dolores Frame, MD  cetirizine (ZYRTEC) 10 MG tablet Take 10 mg by mouth daily.   Yes [provider]  Cholecalciferol (VITAMIN D3 SUPER STRENGTH) 50 MCG (2000 UT) CAPS Take 2,000 Units by mouth daily.   Yes [provider]  clopidogrel (PLAVIX) 75 MG tablet Take 1 tablet (75 mg total) by mouth daily. 11/27/22  Yes BranchDorothe Pea, MD  clorazepate (TRANXENE) 3.75 MG tablet Take 1 tablet (3.75 mg total)  by mouth daily as needed for anxiety. 02/18/23  Yes Elsie Lincoln, MD  famotidine (PEPCID) 20 MG tablet Take 1 tablet (20 mg total) by mouth at bedtime. 10/09/19  Yes Tawni Pummel B, PA-C  furosemide (LASIX) 40 MG tablet Take 1 tablet (40 mg total) by mouth daily as needed for fluid (leg/hand swelling.). 11/27/22  Yes Branch, Dorothe Pea, MD  HYDROcodone-acetaminophen (NORCO) 10-325 MG tablet Take 1 tablet by mouth every 6 (six) hours as needed for moderate pain (pain score 4-6). 02/10/23  Yes Fanny Dance, MD  isosorbide mononitrate (IMDUR) 120 MG 24 hr tablet Take 1 tablet (120 mg total) by mouth daily. 10/02/22  Yes Strader, Lennart Pall, PA-C  JARDIANCE 10 MG TABS tablet Take 10 mg by mouth every morning. 05/13/21  Yes [provider]  Ketotifen Fumarate (ALLERGY EYE DROPS OP) Place 1 drop into both eyes daily as needed (allergies).   Yes [provider]  levothyroxine (SYNTHROID, LEVOTHROID) 50 MCG tablet Take 50 mcg by mouth daily before breakfast.  07/20/14  Yes [provider]  losartan (COZAAR) 25 MG tablet Take 1 tablet (25 mg total) by mouth daily. 11/27/22  Yes BranchDorothe Pea, MD  naloxone [facility]) nasal spray 4 mg/0.1 mL In case of opiod overdose 01/12/23  Yes Fanny Dance, MD  nitroGLYCERIN (NITROSTAT) 0.4 MG SL tablet Place 0.4 mg under the tongue every 5 (five) minutes as needed for chest pain.  Yes [provider]  pantoprazole (PROTONIX) 40 MG tablet TAKE 1 TABLET BY MOUTH ONCE A DAY. 01/04/23  Yes Dolores Frame, MD  potassium chloride SA (KLOR-CON M) 20 MEQ tablet Take 1 tablet (20 mEq total) by mouth daily as needed (Take only when you take Lasix). 07/14/22  Yes Dunn, Dayna N, PA-C  rosuvastatin (CRESTOR) 40 MG tablet Take 1 tablet (40 mg total) by mouth daily. 11/27/22 02/25/23 Yes BranchDorothe Pea, MD  ursodiol (ACTIGALL) 500 MG tablet Take 1 tablet (500 mg total) by mouth 2 (two) times daily. 12/07/22  Yes Dolores Frame, MD      Allergies    Gabapentin, Prednisone, Carvedilol, Clonidine derivatives, Diltiazem, Tape, and Latex    Review of Systems   Review of Systems  Genitourinary:  Positive for hematuria. Negative for vaginal bleeding.  Neurological:  Positive for light-headedness.  All other systems reviewed and are negative.   Physical Exam Updated Vital Signs BP 121/65   Pulse 74   Temp 98.7 F (37.1 C) (Oral)   Resp 18   Ht 5\' 3"  (1.6 m)   Wt 83 kg   SpO2 93%   BMI 32.41 kg/m  Physical Exam Vitals and nursing note reviewed.   Gen: NAD Eyes: PERRL, EOMI HEENT: no oropharyngeal swelling Neck: trachea midline Resp: clear to auscultation bilaterally Card: RRR, no murmurs, rubs, or gallops Abd: nontender, nondistended Extremities: no calf tenderness, no edema Vascular: 2+ radial pulses bilaterally, 2+ DP pulses bilaterally Neuro: Cranial nerve intact, equal strength sensation throughout bilateral upper and lower extremities, no dysmetria on finger-nose testing Skin: no rashes Psyc: acting appropriately   ED Results / Procedures / Treatments   Labs (all labs ordered are listed, but only abnormal results are displayed) Labs Reviewed  BASIC METABOLIC PANEL - Abnormal; Notable for the following components:      Result Value   Glucose, Bld 119 (*)    All other components within normal limits  CBC - Abnormal; Notable for the following components:   WBC 12.8 (*)    All other components within normal limits  D-DIMER, QUANTITATIVE    EKG EKG Interpretation Date/Time:  Wednesday February 24 2023 19:32:35 EST Ventricular Rate:  78 PR Interval:  185 QRS Duration:  104 QT Interval:  407 QTC Calculation: 464 R Axis:   52  Text Interpretation: Sinus rhythm Low voltage, precordial leads Confirmed by Beckey Downing [phone]) on 02/24/2023 7:47:12 PM  Radiology No results found.  Procedures Procedures    Medications Ordered in ED Medications - No data to  display  ED Course/ Medical Decision Making/ A&P                                 Medical Decision Making 72 year old female with recently diagnosed bladder cancer who is on Plavix presenting to the emergency department today with concern for lightheadedness.  I will further evaluate the patient here with basic labs Wels D-dimer to evaluate for anemia, electrolyte abnormalities, or pulmonary embolism.  I will keep her on the monitor to evaluate for arrhythmias.  Will reevaluate for ultimate disposition.  The patient's lab here are reassuring.  There were no significant electrolyte abnormalities.  Her hemoglobin is stable here.  D-dimer is negative.  I think that she is stable for discharge.  Her EKG does not show any conduction disturbances and she has remained stable here in the emergency department.  Amount and/or  Complexity of Data Reviewed Labs: ordered.           Final Clinical Impression(s) / ED Diagnoses Final diagnoses:  Lightheadedness    Rx / DC Orders ED Discharge Orders     None         Durwin Glaze, MD 02/24/23 2059

## 2023-02-25 ENCOUNTER — Ambulatory Visit: Payer: 59 | Admitting: Internal Medicine

## 2023-03-01 ENCOUNTER — Encounter: Payer: 59 | Admitting: Physical Medicine & Rehabilitation

## 2023-03-03 ENCOUNTER — Telehealth: Payer: Self-pay | Admitting: Urology

## 2023-03-03 NOTE — Telephone Encounter (Signed)
Wants to schedule surgery

## 2023-03-04 ENCOUNTER — Telehealth: Payer: Self-pay | Admitting: *Deleted

## 2023-03-04 ENCOUNTER — Other Ambulatory Visit: Payer: Self-pay | Admitting: Student

## 2023-03-04 NOTE — Telephone Encounter (Signed)
Pt has been added to the preop app schedule, 03/05/23 10:40, per preop app, due to date of surgery.  Consent on file / medications have been reconciled.     Patient Consent for Virtual Visit        [name] has provided verbal consent on 03/04/2023 for a virtual visit (video or telephone).   CONSENT FOR VIRTUAL VISIT FOR:  [name]  By participating in this virtual visit I agree to the following:  I hereby voluntarily request, consent and authorize [facility] and its employed or contracted physicians, physician assistants, nurse practitioners or other licensed health care professionals (the Practitioner), to provide me with telemedicine health care services (the "Services") as deemed necessary by the treating Practitioner. I acknowledge and consent to receive the Services by the Practitioner via telemedicine. I understand that the telemedicine visit will involve communicating with the Practitioner through live audiovisual communication technology and the disclosure of certain medical information by electronic transmission. I acknowledge that I have been given the opportunity to request an in-person assessment or other available alternative prior to the telemedicine visit and am voluntarily participating in the telemedicine visit.  I understand that I have the right to withhold or withdraw my consent to the use of telemedicine in the course of my care at any time, without affecting my right to future care or treatment, and that the Practitioner or I may terminate the telemedicine visit at any time. I understand that I have the right to inspect all information obtained and/or recorded in the course of the telemedicine visit and may receive copies of available information for a reasonable fee.  I understand that some of the potential risks of receiving the Services via telemedicine include:  Delay or interruption in medical evaluation due to technological equipment  failure or disruption; Information transmitted may not be sufficient (e.g. poor resolution of images) to allow for appropriate medical decision making by the Practitioner; and/or  In rare instances, security protocols could fail, causing a breach of personal health information.  Furthermore, I acknowledge that it is my responsibility to provide information about my medical history, conditions and care that is complete and accurate to the best of my ability. I acknowledge that Practitioner's advice, recommendations, and/or decision may be based on factors not within their control, such as incomplete or inaccurate data provided by me or distortions of diagnostic images or specimens that may result from electronic transmissions. I understand that the practice of medicine is not an exact science and that Practitioner makes no warranties or guarantees regarding treatment outcomes. I acknowledge that a copy of this consent can be made available to me via my patient portal [facility] MyChart), or I can request a printed copy by calling the office of [facility].    I understand that my insurance will be billed for this visit.   I have read or had this consent read to me. I understand the contents of this consent, which adequately explains the benefits and risks of the Services being provided via telemedicine.  I have been provided ample opportunity to ask questions regarding this consent and the Services and have had my questions answered to my satisfaction. I give my informed consent for the services to be provided through the use of telemedicine in my medical care

## 2023-03-04 NOTE — Telephone Encounter (Signed)
Pt has been added to the preop app schedule, 03/05/23 10:40, per preop app, due to date of surgery.  Consent on file / medications have been reconciled.

## 2023-03-04 NOTE — Telephone Encounter (Signed)
   Pre-operative Risk Assessment    Patient Name: JAQUISHA ACEVES  DOB: 11/28/1950 MRN: [id number]      Request for Surgical Clearance    Procedure:   CYSTOSCOPY WITH TRANSURETHRAL RESECTION OF BLADDER TUMOR  Date of Surgery:  Clearance 03/15/23                                 Surgeon:  DR. PATRICK MCKENZIE Surgeon's Group or Practice Name:  [facility] [place] Phone number:  [phone] Fax number:  [phone]   Type of Clearance Requested:   - Medical  - Pharmacy:  Hold Aspirin and Clopidogrel (Plavix) X'S 5 DAYS, NOT INDICATED ON ASPIRIN   Type of Anesthesia:  General    Additional requests/questions:    Signed, Elliot Cousin   03/04/2023, 7:40 AM

## 2023-03-04 NOTE — Telephone Encounter (Signed)
Contacted patient and updated I surgery workque

## 2023-03-04 NOTE — Telephone Encounter (Signed)
   Name: [name]  DOB: May 15, 1950  MRN: [id number]  Primary Cardiologist: Dina Rich, MD   Preoperative team, please contact this patient and set up a phone call appointment for further preoperative risk assessment. Please obtain consent and complete medication review. Thank you for your help.  I confirm that guidance regarding antiplatelet and oral anticoagulation therapy has been completed and, if necessary, noted below.  Per protocol patient can hold Plavix 5 days prior to procedure and should restart postprocedure when surgically safe and hemostasis is achieved.  Please continue ASA 81 mg if possible through perioperative period.  I also confirmed the patient resides in the state of [place]. As per [facility] Board telemedicine laws, the patient must reside in the state in which the provider is licensed.   Napoleon Form, Leodis Rains, NP 03/04/2023, 8:21 AM [facility]

## 2023-03-05 ENCOUNTER — Ambulatory Visit: Payer: 59 | Attending: Nurse Practitioner

## 2023-03-05 DIAGNOSIS — Z01810 Encounter for preprocedural cardiovascular examination: Secondary | ICD-10-CM

## 2023-03-05 NOTE — Progress Notes (Signed)
Virtual Visit via Telephone Note   Because of [name]'s co-morbid illnesses, she is at least at moderate risk for complications without adequate follow up.  This format is felt to be most appropriate for this patient at this time.  The patient did not have access to video technology/had technical difficulties with video requiring transitioning to audio format only (telephone).  All issues noted in this document were discussed and addressed.  No physical exam could be performed with this format.  Please refer to the patient's chart for her consent to telehealth for [facility].  Evaluation Performed:  Preoperative cardiovascular risk assessment _____________   Date:  03/05/2023   Patient ID:  [name], DOB 23-Apr-1950, MRN [id number] Patient Location:  Home Provider location:   Office  Primary Care Provider:  Billie Lade, MD Primary Cardiologist:  Dina Rich, MD  Chief Complaint / Patient Profile   72 y.o. y/o female with a h/o AD s/p NSTEMI 2017 DES to OM PCI to RCA in 2021, left carotid artery dissection, hepatitis, hepatic cirrhosis, hypothyroidism, tobacco abuse, COPD HFpEF CVA, bradycardia, HTN, HLD who is pending cystoscopy with TURBT and presents today for telephonic preoperative cardiovascular risk assessment.  History of Present Illness    [name] is a 72 y.o. female who presents via audio/video conferencing for a telehealth visit today.  Pt was last seen in cardiology clinic on 11/27/2022 by Dr. Dina Rich.  At that time [name] was doing well and endorsed intermittent chest pain that resolved with nitroglycerin.  Imdur was increased to 180 mg and blood pressure was at goal.  She underwent ischemic evaluation by [facility] on 09/2022 that showed patent RCA and OM stents with mild nonobstructive disease.  The patient is now pending procedure as outlined above. Since her last visit, she reports no reoccurrence of chest pain  with increased dose of isosorbide.  She is able to complete all of her ADLs without any difficulties.  She does note shortness of breath with some activity which is primarily related to her COPD.  She denies chest pain,  lower extremity edema, fatigue, palpitations, melena, hematuria, hemoptysis, diaphoresis, weakness, presyncope, syncope, orthopnea, and PND.    Past Medical History    Past Medical History:  Diagnosis Date   Anxiety    Arthritis    "hands, arms, back, neck, knees, fingers" (01/21/2016)   Bradycardia    Breast cancer, left breast (HCC) 1970s; 2015   CAD (coronary artery disease)    a. s/p NSTEMI in 2017 with DES to OM and unsuccessful PCI to RCA with wire dissection b. DES to RCA in 2021   Cancer of skin of leg    BLE   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HCC)    left   Chronic lower back pain    Chronic pain    COPD (chronic obstructive pulmonary disease) (HCC)    Coronary artery disease    Depression    GERD (gastroesophageal reflux disease)    Heart murmur    Hepatic cirrhosis (HCC) 12/20/2017   Hepatitis    History of blood transfusion    "related to spleen"   History of hiatal hernia    History of kidney stones    Hyperlipidemia    Hypertension    Hypothyroidism    Kidney stone    Melanoma of forearm, left (HCC)    Migraine    "a few migraines/year" (01/21/2016)   Obesity    Stroke (HCC) 04/2003  2 carotid artery stents in place; small left parietal and left hemispheric CVA.[name] 01/21/2016   Tobacco use disorder    50 pack years; questionably discontinued in 2010   Wears glasses    Past Surgical History:  Procedure Laterality Date   ABDOMINAL HYSTERECTOMY  1978   APPENDECTOMY     BIOPSY  04/28/2022   Procedure: BIOPSY;  Surgeon: Marguerita Merles, Reuel Boom, MD;  [facility];  Service: Gastroenterology;;   BREAST BIOPSY Left 1970s; 2015   BREAST LUMPECTOMY Left 1970s   BREAST LUMPECTOMY WITH NEEDLE LOCALIZATION Left 07/07/2013   Procedure:  BREAST LUMPECTOMY WITH NEEDLE LOCALIZATION;  Surgeon: Robyne Askew, MD;  Location: [facility];  Service: General;  Laterality: Left;   CARDIAC CATHETERIZATION N/A 01/22/2016   Procedure: Left Heart Cath and Coronary Angiography;  Surgeon: Peter M Swaziland, MD;  Location: [facility] INVASIVE CV LAB;  Service: Cardiovascular;  Laterality: N/A;   CARDIAC CATHETERIZATION N/A 01/22/2016   Procedure: Coronary Stent Intervention;  Surgeon: Peter M Swaziland, MD;  Location: [facility] INVASIVE CV LAB;  Service: Cardiovascular;  Laterality: N/A;   CAROTID STENT Left 04/2003   small left parietal and left hemispheric CVA/notes 07/29/2010   CHOLECYSTECTOMY OPEN     COLONOSCOPY  2011   COLONOSCOPY N/A 05/10/2019   Procedure: COLONOSCOPY;  Surgeon: Malissa Hippo, MD;  [facility];  Service: Endoscopy;  Laterality: N/A;  1:45   COLONOSCOPY WITH PROPOFOL N/A 09/01/2022   Procedure: COLONOSCOPY WITH PROPOFOL;  Surgeon: Dolores Frame, MD;  [facility];  Service: Gastroenterology;  Laterality: N/A;  1:45PM;ASA 3   CORONARY ATHERECTOMY N/A 12/29/2019   Procedure: CORONARY ATHERECTOMY;  Surgeon: Corky Crafts, MD;  Location: [facility]VAS[facility] CV LAB;  Service: Cardiovascular;  Laterality: N/A;  Prox RCA   CORONARY STENT INTERVENTION N/A 12/29/2019   Procedure: CORONARY STENT INTERVENTION;  Surgeon: Corky Crafts, MD;  Location: [facility] INVASIVE CV LAB;  Service: Cardiovascular;  Laterality: N/A;  Prox RCA   CORONARY ULTRASOUND/IVUS N/A 12/29/2019   Procedure: Intravascular Ultrasound/IVUS;  Surgeon: Corky Crafts, MD;  Location: [facility] INVASIVE CV LAB;  Service: Cardiovascular;  Laterality: N/A;   ESOPHAGEAL DILATION N/A 04/28/2022   Procedure: ESOPHAGEAL DILATION;  Surgeon: Dolores Frame, MD;  [facility];  Service: Gastroenterology;  Laterality: N/A;   ESOPHAGOGASTRODUODENOSCOPY  08/06/2011   Procedure: ESOPHAGOGASTRODUODENOSCOPY (EGD);  Surgeon: Malissa Hippo, MD;  [facility];  Service: Endoscopy;  Laterality: N/A;   ESOPHAGOGASTRODUODENOSCOPY N/A 05/10/2019   Procedure: ESOPHAGOGASTRODUODENOSCOPY (EGD);  Surgeon: Malissa Hippo, MD;  [facility];  Service: Endoscopy;  Laterality: N/A;   ESOPHAGOGASTRODUODENOSCOPY (EGD) WITH PROPOFOL N/A 04/28/2022   Procedure: ESOPHAGOGASTRODUODENOSCOPY (EGD) WITH PROPOFOL;  Surgeon: Dolores Frame, MD;  [facility];  Service: Gastroenterology;  Laterality: N/A;  10:30 am, pt can't move up due to transportation   FLEXIBLE SIGMOIDOSCOPY  08/06/2011   Procedure: FLEXIBLE SIGMOIDOSCOPY;  Surgeon: Malissa Hippo, MD;  [facility];  Service: Endoscopy;  Laterality: N/A;   HEMOSTASIS CLIP PLACEMENT  09/01/2022   Procedure: HEMOSTASIS CLIP PLACEMENT;  Surgeon: Dolores Frame, MD;  [facility];  Service: Gastroenterology;;   INGUINAL HERNIA REPAIR Left 1970s   IR ANGIO INTRA EXTRACRAN SEL COM CAROTID INNOMINATE BILAT MOD SED  11/01/2020   IR ANGIO VERTEBRAL SEL VERTEBRAL BILAT MOD SED  11/01/2020   IR RADIOLOGIST EVAL & MGMT  09/23/2020   IR US  GUIDE VASC ACCESS RIGHT  11/01/2020   LARYNX SURGERY  1970s   Polyps excised   LEFT HEART CATH AND CORONARY ANGIOGRAPHY N/A 12/29/2019   Procedure: LEFT HEART CATH AND CORONARY ANGIOGRAPHY;  Surgeon: Corky Crafts, MD;  Location: [facility] INVASIVE CV LAB;  Service: Cardiovascular;  Laterality: N/A;   LEFT HEART CATH AND CORONARY ANGIOGRAPHY N/A 10/12/2022   Procedure: LEFT HEART CATH AND CORONARY ANGIOGRAPHY;  Surgeon: Corky Crafts, MD;  Location: [facility] INVASIVE CV LAB;  Service: Cardiovascular;  Laterality: N/A;   MALONEY DILATION  05/10/2019   Procedure: [name] DILATION;  Surgeon: Malissa Hippo, MD;  [facility];  Service: Endoscopy;;   MELANOMA EXCISION Left ~ 2016   forearm   POLYPECTOMY  05/10/2019   Procedure: POLYPECTOMY;  Surgeon: Malissa Hippo, MD;  [facility];  Service: Endoscopy;;  colon   POLYPECTOMY  09/01/2022   Procedure: POLYPECTOMY INTESTINAL;  Surgeon: Dolores Frame, MD;  [facility];  Service: Gastroenterology;;   SPLENECTOMY, 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   spontaneous rupture   SUBMUCOSAL LIFTING INJECTION  09/01/2022   Procedure: SUBMUCOSAL LIFTING INJECTION;  Surgeon: Dolores Frame, MD;  [facility];  Service: Gastroenterology;;   SUBMUCOSAL TATTOO INJECTION  09/01/2022   Procedure: SUBMUCOSAL TATTOO INJECTION;  Surgeon: Dolores Frame, MD;  [facility];  Service: Gastroenterology;;   TEMPORARY PACEMAKER N/A 12/29/2019   Procedure: TEMPORARY PACEMAKER;  Surgeon: Corky Crafts, MD;  Location: [facility] INVASIVE CV LAB;  Service: Cardiovascular;  Laterality: N/A;    Allergies  Allergies  Allergen Reactions   Gabapentin Nausea And Vomiting    Other Reaction(s): Not available   Prednisone Palpitations    Other Reaction(s): Not available   Carvedilol     Stopped due to bradycardia   Clonidine Derivatives     Stopped due to bradycardia    Diltiazem     Stopped due to bradycardia    Tape Other (See Comments)    Causes blisters to form   Latex Other (See Comments) and Rash    Blisters  Other Reaction(s): Not available    Home Medications    Prior to Admission medications   Medication Sig Start Date End Date Taking? Authorizing Provider  albuterol (PROVENTIL HFA;VENTOLIN HFA) 108 (90 Base) MCG/ACT inhaler Inhale 2 puffs into the lungs every 4 (four) hours as needed for wheezing or shortness of breath.    [provider]  amLODipine (NORVASC) 5 MG tablet TAKE ONE TABLET BY MOUTH EVERY DAY 02/15/23   Antoine Poche, MD  aspirin EC 81 MG tablet Take 1 tablet (81 mg total) by mouth daily. 05/11/19   Malissa Hippo, MD  azaTHIOprine (IMURAN) 50 MG tablet Take 1 tablet (50 mg total) by mouth daily. 12/07/22   Dolores Frame, MD  cetirizine  (ZYRTEC) 10 MG tablet Take 10 mg by mouth daily.    [provider]  Cholecalciferol (VITAMIN D3 SUPER STRENGTH) 50 MCG (2000 UT) CAPS Take 2,000 Units by mouth daily.    [provider]  clopidogrel (PLAVIX) 75 MG tablet Take 1 tablet (75 mg total) by mouth daily. 11/27/22   Antoine Poche, MD  clorazepate (TRANXENE) 3.75 MG tablet Take 1 tablet (3.75 mg total) by mouth daily as needed for anxiety. 02/18/23   Elsie Lincoln, MD  famotidine (PEPCID) 20 MG tablet Take 1 tablet (20 mg total) by mouth at bedtime. 10/09/19   Ardelia Mems,  PA-C  furosemide (LASIX) 40 MG tablet Take 1 tablet (40 mg total) by mouth daily as needed for fluid (leg/hand swelling.). 11/27/22   Antoine Poche, MD  HYDROcodone-acetaminophen (NORCO) 10-325 MG tablet Take 1 tablet by mouth every 6 (six) hours as needed for moderate pain (pain score 4-6). 02/10/23   Fanny Dance, MD  isosorbide mononitrate (IMDUR) 120 MG 24 hr tablet TAKE ONE TABLET BY MOUTH EVERY DAY 03/04/23   Antoine Poche, MD  JARDIANCE 10 MG TABS tablet Take 10 mg by mouth every morning. 05/13/21   [provider]  Ketotifen Fumarate (ALLERGY EYE DROPS OP) Place 1 drop into both eyes daily as needed (allergies).    [provider]  levothyroxine (SYNTHROID, LEVOTHROID) 50 MCG tablet Take 50 mcg by mouth daily before breakfast.  07/20/14   [provider]  losartan (COZAAR) 25 MG tablet Take 1 tablet (25 mg total) by mouth daily. 11/27/22   Antoine Poche, MD  naloxone [facility]) nasal spray 4 mg/0.1 mL In case of opiod overdose 01/12/23   Fanny Dance, MD  nitroGLYCERIN (NITROSTAT) 0.4 MG SL tablet Place 0.4 mg under the tongue every 5 (five) minutes as needed for chest pain.      [provider]  pantoprazole (PROTONIX) 40 MG tablet TAKE 1 TABLET BY MOUTH ONCE A DAY. 01/04/23   Dolores Frame, MD  potassium chloride SA (KLOR-CON M) 20 MEQ tablet Take 1 tablet (20 mEq  total) by mouth daily as needed (Take only when you take Lasix). 07/14/22   Dunn, Tacey Ruiz, PA-C  rosuvastatin (CRESTOR) 40 MG tablet Take 1 tablet (40 mg total) by mouth daily. 11/27/22 02/25/23  Antoine Poche, MD  ursodiol (ACTIGALL) 500 MG tablet Take 1 tablet (500 mg total) by mouth 2 (two) times daily. 12/07/22   Dolores Frame, MD    Physical Exam    Vital Signs:  Antony Odea does not have vital signs available for review today.   Given telephonic nature of communication, physical exam is limited. AAOx3. NAD. Normal affect.  Speech and respirations are unlabored.  Accessory Clinical Findings    None  Assessment & Plan    1.  Preoperative Cardiovascular Risk Assessment: -Patient's RCRI score is 11%  The patient affirms she has been doing well without any new cardiac symptoms. They are able to achieve 6 METS without cardiac limitations. Therefore, based on ACC/AHA guidelines, the patient would be at acceptable risk for the planned procedure without further cardiovascular testing. The patient was advised that if she develops new symptoms prior to surgery to contact our office to arrange for a follow-up visit, and she verbalized understanding.   The patient was advised that if she develops new symptoms prior to surgery to contact our office to arrange for a follow-up visit, and she verbalized understanding.  Patient can hold Plavix 5 days prior to procedure and should continue ASA 81 mg through perioperative period  A copy of this note will be routed to requesting surgeon.  Time:   Today, I have spent 7 minutes with the patient with telehealth technology discussing medical history, symptoms, and management plan.     Napoleon Form, Leodis Rains, NP  03/05/2023, 7:22 AM

## 2023-03-11 ENCOUNTER — Encounter (HOSPITAL_COMMUNITY): Payer: Self-pay

## 2023-03-11 ENCOUNTER — Encounter (HOSPITAL_COMMUNITY)
Admission: RE | Admit: 2023-03-11 | Discharge: 2023-03-11 | Disposition: A | Discharge status: Home / Self Care | Payer: 59 | Source: Ambulatory Visit | Attending: Urology | Admitting: Urology

## 2023-03-15 ENCOUNTER — Encounter (HOSPITAL_COMMUNITY)
Admission: RE | Disposition: A | Discharge status: Home / Self Care | Payer: Self-pay | Source: Ambulatory Visit | Attending: Urology

## 2023-03-15 ENCOUNTER — Encounter (HOSPITAL_COMMUNITY): Payer: Self-pay | Admitting: Urology

## 2023-03-15 ENCOUNTER — Ambulatory Visit (HOSPITAL_COMMUNITY): Payer: 59 | Admitting: Anesthesiology

## 2023-03-15 ENCOUNTER — Other Ambulatory Visit: Payer: Self-pay

## 2023-03-15 ENCOUNTER — Ambulatory Visit (HOSPITAL_COMMUNITY)
Admission: RE | Admit: 2023-03-15 | Discharge: 2023-03-15 | Disposition: A | Discharge status: Home / Self Care | Payer: 59 | Source: Ambulatory Visit | Attending: Urology | Admitting: Urology

## 2023-03-15 DIAGNOSIS — K219 Gastro-esophageal reflux disease without esophagitis: Secondary | ICD-10-CM | POA: Insufficient documentation

## 2023-03-15 DIAGNOSIS — I509 Heart failure, unspecified: Secondary | ICD-10-CM | POA: Diagnosis not present

## 2023-03-15 DIAGNOSIS — G894 Chronic pain syndrome: Secondary | ICD-10-CM

## 2023-03-15 DIAGNOSIS — C678 Malignant neoplasm of overlapping sites of bladder: Secondary | ICD-10-CM | POA: Diagnosis not present

## 2023-03-15 DIAGNOSIS — I110 Hypertensive heart disease with heart failure: Secondary | ICD-10-CM

## 2023-03-15 DIAGNOSIS — Z90710 Acquired absence of both cervix and uterus: Secondary | ICD-10-CM | POA: Insufficient documentation

## 2023-03-15 DIAGNOSIS — C679 Malignant neoplasm of bladder, unspecified: Secondary | ICD-10-CM

## 2023-03-15 DIAGNOSIS — I2510 Atherosclerotic heart disease of native coronary artery without angina pectoris: Secondary | ICD-10-CM

## 2023-03-15 DIAGNOSIS — Z8542 Personal history of malignant neoplasm of other parts of uterus: Secondary | ICD-10-CM | POA: Insufficient documentation

## 2023-03-15 DIAGNOSIS — F17210 Nicotine dependence, cigarettes, uncomplicated: Secondary | ICD-10-CM | POA: Insufficient documentation

## 2023-03-15 DIAGNOSIS — Z7902 Long term (current) use of antithrombotics/antiplatelets: Secondary | ICD-10-CM | POA: Diagnosis not present

## 2023-03-15 DIAGNOSIS — F32A Depression, unspecified: Secondary | ICD-10-CM | POA: Diagnosis not present

## 2023-03-15 DIAGNOSIS — Z79890 Hormone replacement therapy (postmenopausal): Secondary | ICD-10-CM | POA: Diagnosis not present

## 2023-03-15 DIAGNOSIS — Z8673 Personal history of transient ischemic attack (TIA), and cerebral infarction without residual deficits: Secondary | ICD-10-CM | POA: Diagnosis not present

## 2023-03-15 DIAGNOSIS — F419 Anxiety disorder, unspecified: Secondary | ICD-10-CM | POA: Insufficient documentation

## 2023-03-15 DIAGNOSIS — I252 Old myocardial infarction: Secondary | ICD-10-CM | POA: Diagnosis not present

## 2023-03-15 DIAGNOSIS — E119 Type 2 diabetes mellitus without complications: Secondary | ICD-10-CM | POA: Diagnosis not present

## 2023-03-15 DIAGNOSIS — E039 Hypothyroidism, unspecified: Secondary | ICD-10-CM | POA: Diagnosis not present

## 2023-03-15 DIAGNOSIS — J449 Chronic obstructive pulmonary disease, unspecified: Secondary | ICD-10-CM | POA: Diagnosis not present

## 2023-03-15 DIAGNOSIS — C672 Malignant neoplasm of lateral wall of bladder: Secondary | ICD-10-CM

## 2023-03-15 DIAGNOSIS — D494 Neoplasm of unspecified behavior of bladder: Secondary | ICD-10-CM

## 2023-03-15 DIAGNOSIS — Z7984 Long term (current) use of oral hypoglycemic drugs: Secondary | ICD-10-CM | POA: Insufficient documentation

## 2023-03-15 HISTORY — PX: TRANSURETHRAL RESECTION OF BLADDER TUMOR: SHX2575

## 2023-03-15 LAB — GLUCOSE, CAPILLARY
Glucose-Capillary: 102 mg/dL — ABNORMAL HIGH (ref 70–99)
Glucose-Capillary: 95 mg/dL (ref 70–99)

## 2023-03-15 SURGERY — TURBT (TRANSURETHRAL RESECTION OF BLADDER TUMOR)
Anesthesia: General | Site: Urethra

## 2023-03-15 MED ORDER — SODIUM CHLORIDE 0.9 % IR SOLN
Status: DC | PRN
  Administered 2023-03-15: 3000 mL

## 2023-03-15 MED ORDER — FENTANYL CITRATE (PF) 100 MCG/2ML IJ SOLN
INTRAMUSCULAR | Status: DC | PRN
  Administered 2023-03-15 (×2): 25 ug via INTRAVENOUS
  Administered 2023-03-15: 50 ug via INTRAVENOUS

## 2023-03-15 MED ORDER — CHLORHEXIDINE GLUCONATE 0.12 % MT SOLN
15.0000 mL | Freq: Once | OROMUCOSAL | Status: AC
Start: 2023-03-15 — End: 2023-03-15
  Administered 2023-03-15: 15 mL via OROMUCOSAL
  Filled 2023-03-15: qty 15

## 2023-03-15 MED ORDER — OXYCODONE HCL 5 MG PO TABS: 5.0000 mg | ORAL_TABLET | Freq: Once | ORAL | Status: DC | PRN

## 2023-03-15 MED ORDER — ONDANSETRON HCL 4 MG/2ML IJ SOLN: 4.0000 mg | Freq: Once | INTRAMUSCULAR | Status: DC | PRN

## 2023-03-15 MED ORDER — OXYCODONE-ACETAMINOPHEN 5-325 MG PO TABS: 1.0000 | ORAL_TABLET | ORAL | 0 refills | Status: DC | PRN

## 2023-03-15 MED ORDER — MIDAZOLAM HCL 2 MG/2ML IJ SOLN
INTRAMUSCULAR | Status: AC
Start: 2023-03-15 — End: ?
  Filled 2023-03-15: qty 2

## 2023-03-15 MED ORDER — ROCURONIUM BROMIDE 100 MG/10ML IV SOLN
INTRAVENOUS | Status: DC | PRN
  Administered 2023-03-15: 50 mg via INTRAVENOUS

## 2023-03-15 MED ORDER — FENTANYL CITRATE PF 50 MCG/ML IJ SOSY: 25.0000 ug | PREFILLED_SYRINGE | INTRAMUSCULAR | Status: DC | PRN

## 2023-03-15 MED ORDER — PHENYLEPHRINE 80 MCG/ML (10ML) SYRINGE FOR IV PUSH (FOR BLOOD PRESSURE SUPPORT)
PREFILLED_SYRINGE | INTRAVENOUS | Status: DC | PRN
  Administered 2023-03-15: 80 ug via INTRAVENOUS

## 2023-03-15 MED ORDER — OXYCODONE HCL 5 MG/5ML PO SOLN: 5.0000 mg | Freq: Once | ORAL | Status: DC | PRN

## 2023-03-15 MED ORDER — LIDOCAINE HCL (CARDIAC) PF 100 MG/5ML IV SOSY
PREFILLED_SYRINGE | INTRAVENOUS | Status: DC | PRN
  Administered 2023-03-15: 100 mg via INTRATRACHEAL

## 2023-03-15 MED ORDER — GEMCITABINE CHEMO FOR BLADDER INSTILLATION 2000 MG
2000.0000 mg | Freq: Once | INTRAVENOUS | Status: DC
  Filled 2023-03-15: qty 52.6

## 2023-03-15 MED ORDER — ONDANSETRON HCL 4 MG/2ML IJ SOLN
4.0000 mg | Freq: Once | INTRAMUSCULAR | Status: DC | PRN
Start: 2023-03-15 — End: 2023-03-15

## 2023-03-15 MED ORDER — ORAL CARE MOUTH RINSE: 15.0000 mL | Freq: Once | OROMUCOSAL | Status: AC

## 2023-03-15 MED ORDER — FENTANYL CITRATE PF 50 MCG/ML IJ SOSY
25.0000 ug | PREFILLED_SYRINGE | INTRAMUSCULAR | Status: DC | PRN
Start: 2023-03-15 — End: 2023-03-15
  Administered 2023-03-15: 50 ug via INTRAVENOUS
  Filled 2023-03-15: qty 1

## 2023-03-15 MED ORDER — LACTATED RINGERS IV SOLN: INTRAVENOUS | Status: DC

## 2023-03-15 MED ORDER — STERILE WATER FOR IRRIGATION IR SOLN
Status: DC | PRN
  Administered 2023-03-15: 500 mL

## 2023-03-15 MED ORDER — ONDANSETRON HCL 4 MG/2ML IJ SOLN
INTRAMUSCULAR | Status: AC
  Filled 2023-03-15: qty 2

## 2023-03-15 MED ORDER — FENTANYL CITRATE (PF) 100 MCG/2ML IJ SOLN
INTRAMUSCULAR | Status: AC
  Filled 2023-03-15: qty 2

## 2023-03-15 MED ORDER — PROPOFOL 10 MG/ML IV BOLUS
INTRAVENOUS | Status: DC | PRN
  Administered 2023-03-15: 160 mg via INTRAVENOUS

## 2023-03-15 MED ORDER — ONDANSETRON HCL 4 MG/2ML IJ SOLN
INTRAMUSCULAR | Status: DC | PRN
  Administered 2023-03-15: 4 mg via INTRAVENOUS

## 2023-03-15 MED ORDER — GEMCITABINE CHEMO FOR BLADDER INSTILLATION 2000 MG
INTRAVENOUS | Status: DC | PRN
  Administered 2023-03-15: 2000 mg via INTRAVESICAL

## 2023-03-15 MED ORDER — CEFAZOLIN SODIUM-DEXTROSE 2-4 GM/100ML-% IV SOLN
2.0000 g | INTRAVENOUS | Status: AC
Start: 2023-03-15 — End: 2023-03-15
  Administered 2023-03-15: 2 g via INTRAVENOUS
  Filled 2023-03-15: qty 100

## 2023-03-15 MED ORDER — SUGAMMADEX SODIUM 200 MG/2ML IV SOLN
INTRAVENOUS | Status: DC | PRN
  Administered 2023-03-15: 200 mg via INTRAVENOUS

## 2023-03-15 SURGICAL SUPPLY — 22 items
BAG DRAIN URO TABLE W/ADPT NS (BAG) ×1 IMPLANT
BAG HAMPER (MISCELLANEOUS) ×1 IMPLANT
BAG URINE DRAIN 2000ML AR STRL (UROLOGICAL SUPPLIES) ×1 IMPLANT
CATH FOLEY LF 20FR (CATHETERS) IMPLANT
CLOTH BEACON ORANGE TIMEOUT ST (SAFETY) ×1 IMPLANT
ELECT LOOP 22F BIPOLAR SML (ELECTROSURGICAL) ×1
ELECTRODE LOOP 22F BIPOLAR SML (ELECTROSURGICAL) ×1 IMPLANT
GLOVE BIO SURGEON STRL SZ8 (GLOVE) ×1 IMPLANT
GLOVE BIOGEL PI IND STRL 7.0 (GLOVE) ×2 IMPLANT
GOWN STRL REUS W/TWL LRG LVL3 (GOWN DISPOSABLE) ×1 IMPLANT
GOWN STRL REUS W/TWL XL LVL3 (GOWN DISPOSABLE) ×1 IMPLANT
IV NS IRRIG 3000ML ARTHROMATIC (IV SOLUTION) ×2 IMPLANT
KIT CHEMO SPILL (MISCELLANEOUS) ×1 IMPLANT
KIT TURNOVER CYSTO (KITS) ×1 IMPLANT
PACK CYSTO (CUSTOM PROCEDURE TRAY) ×1 IMPLANT
PAD ARMBOARD 7.5X6 YLW CONV (MISCELLANEOUS) ×1 IMPLANT
PLUG CATH AND CAP STER (CATHETERS) IMPLANT
POSITIONER HEAD 8X9X4 ADT (SOFTGOODS) ×1 IMPLANT
SYR 30ML LL (SYRINGE) ×1 IMPLANT
SYR TOOMEY IRRIG 70ML (MISCELLANEOUS) ×1
SYRINGE TOOMEY IRRIG 70ML (MISCELLANEOUS) ×1 IMPLANT
TOWEL OR 17X26 4PK STRL BLUE (TOWEL DISPOSABLE) ×1 IMPLANT

## 2023-03-15 NOTE — Op Note (Signed)
.  Preoperative diagnosis: bladder tumor  Postoperative diagnosis: Same  Procedure: 1 cystoscopy 2. Transurethral resection of bladder tumor, large Instillation of bladder chemotherapy agent  Attending: Cleda Mccreedy  Anesthesia: General  Estimated blood loss: Minimal  Drains: 20 French foley  Specimens: bladder tumor  Antibiotics: ancef  Findings: 6cm sessile right lateral wall and posterior bladder tumor.  Ureteral orifices in normal anatomic location.   Indications: Patient is a 72 year old female with a history of bladder tumor and gross hematuria.  After discussing treatment options, they decided proceed with transurethral resection of a bladder tumor.  Procedure in detail: The patient was brought to the operating room and a brief timeout was done to ensure correct patient, correct procedure, correct site.  General anesthesia was administered patient was placed in dorsal lithotomy position.  Their genitalia was then prepped and draped in usual sterile fashion.  A rigid 22 French cystoscope was passed in the urethra and the bladder.  Bladder was inspected and we noted a 6cm bladder tumor.  the ureteral orifices were in the normal orthotopic locations.  We then removed the cystoscope and placed a resectoscope into the bladder. We proceeded to remove the large clot burden from the bladder. Once this was complete we turned our attention to the bladder tumor. Using the bipolar resectoscope we removed the bladder tumor down to the base. A subsequent muscle deep biopsy was then taken. Hemostasis was then obtained with electrocautery. We then removed the bladder tumor chips and sent them for pathology. We then re-inspected the bladder and found no residula bleeding.  the bladder was then drained, a 20 French foley was placed and 2g of gemcitabine was instilled in to the bladder. This concluded the procedure which was well tolerated by patient.  Complications: None  Condition: Stable,  extubated, transferred to PACU  Plan: Patient is to have the foley drained in 1 hour and be discharged home with the foley in place. She will followup in 1 week for foley removal and pathology discussion

## 2023-03-15 NOTE — H&P (Signed)
HPI: [name] is a 72yo here for bladder tumor resection. She has been having intermittent gross hematuria for the past 3 months. Starting 4 days ago she developed persistent gross hematuria which then resolved today. UA shows >30 rbcs. She has a hx of hysterectomy for uterine cancer but did not receive radiation. She denies any dysuria or pelvic pain. She was on plavix but it was stopped 1 month ago. She has a 40pk year smoking. Ct stone study from 11/17 showed no GU abnormalities     PMH:     Past Medical History:  Diagnosis Date   Anxiety     Arthritis      "hands, arms, back, neck, knees, fingers" (01/21/2016)   Bradycardia     Breast cancer, left breast (HCC) 1970s; 2015   CAD (coronary artery disease)      a. s/p NSTEMI in 2017 with DES to OM and unsuccessful PCI to RCA with wire dissection b. DES to RCA in 2021   Cancer of skin of leg      BLE   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HCC)      left   Chronic lower back pain     Chronic pain     COPD (chronic obstructive pulmonary disease) (HCC)     Coronary artery disease     Depression     GERD (gastroesophageal reflux disease)     Heart murmur     Hepatic cirrhosis (HCC) 12/20/2017   Hepatitis     History of blood transfusion      "related to spleen"   History of hiatal hernia     History of kidney stones     Hyperlipidemia     Hypertension     Hypothyroidism     Kidney stone     Melanoma of forearm, left (HCC)     Migraine      "a few migraines/year" (01/21/2016)   Obesity     Stroke (HCC) 04/2003    2 carotid artery stents in place; small left parietal and left hemispheric CVA.[name] 01/21/2016   Tobacco use disorder      50 pack years; questionably discontinued in 2010   Wears glasses            Surgical History:      Past Surgical History:  Procedure Laterality Date   ABDOMINAL HYSTERECTOMY   1978   APPENDECTOMY       BIOPSY   04/28/2022    Procedure: BIOPSY;  Surgeon: Marguerita Merles, Reuel Boom, MD;  [facility]: [facility];  Service: Gastroenterology;;   BREAST BIOPSY Left 1970s; 2015   BREAST LUMPECTOMY Left 1970s   BREAST LUMPECTOMY WITH NEEDLE LOCALIZATION Left 07/07/2013    Procedure: BREAST LUMPECTOMY WITH NEEDLE LOCALIZATION;  Surgeon: Robyne Askew, MD;  Location: [facility];  Service: General;  Laterality: Left;   CARDIAC CATHETERIZATION N/A 01/22/2016    Procedure: Left Heart Cath and Coronary Angiography;  Surgeon: Peter M Swaziland, MD;  Location: [facility] INVASIVE CV LAB;  Service: Cardiovascular;  Laterality: N/A;   CARDIAC CATHETERIZATION N/A 01/22/2016    Procedure: Coronary Stent Intervention;  Surgeon: Peter M Swaziland, MD;  Location: [facility] INVASIVE CV LAB;  Service: Cardiovascular;  Laterality: N/A;   CAROTID STENT Left 04/2003    small left parietal and left hemispheric CVA/notes 07/29/2010   CHOLECYSTECTOMY OPEN       COLONOSCOPY   2011   COLONOSCOPY N/A 05/10/2019    Procedure: COLONOSCOPY;  Surgeon: Karilyn Cota,  Joline Maxcy, MD;  [facility]: [facility];  Service: Endoscopy;  Laterality: N/A;  1:45   COLONOSCOPY WITH PROPOFOL N/A 09/01/2022    Procedure: COLONOSCOPY WITH PROPOFOL;  Surgeon: Dolores Frame, MD;  [facility]: [facility];  Service: Gastroenterology;  Laterality: N/A;  1:45PM;ASA 3   CORONARY ATHERECTOMY N/A 12/29/2019    Procedure: CORONARY ATHERECTOMY;  Surgeon: Corky Crafts, MD;  Location: [facility] INVASIVE CV LAB;  Service: Cardiovascular;  Laterality: N/A;  Prox RCA   CORONARY STENT INTERVENTION N/A 12/29/2019    Procedure: CORONARY STENT INTERVENTION;  Surgeon: Corky Crafts, MD;  Location: [facility] INVASIVE CV LAB;  Service: Cardiovascular;  Laterality: N/A;  Prox RCA   CORONARY ULTRASOUND/IVUS N/A 12/29/2019    Procedure: Intravascular Ultrasound/IVUS;  Surgeon: Corky Crafts, MD;  Location: [facility]VAS[facility] CV LAB;  Service: Cardiovascular;  Laterality: N/A;   ESOPHAGEAL DILATION N/A 04/28/2022    Procedure: ESOPHAGEAL DILATION;  Surgeon: Dolores Frame, MD;  [facility];  Service: Gastroenterology;  Laterality: N/A;   ESOPHAGOGASTRODUODENOSCOPY   08/06/2011    Procedure: ESOPHAGOGASTRODUODENOSCOPY (EGD);  Surgeon: Malissa Hippo, MD;  [facility];  Service: Endoscopy;  Laterality: N/A;   ESOPHAGOGASTRODUODENOSCOPY N/A 05/10/2019    Procedure: ESOPHAGOGASTRODUODENOSCOPY (EGD);  Surgeon: Malissa Hippo, MD;  [facility];  Service: Endoscopy;  Laterality: N/A;   ESOPHAGOGASTRODUODENOSCOPY (EGD) WITH PROPOFOL N/A 04/28/2022    Procedure: ESOPHAGOGASTRODUODENOSCOPY (EGD) WITH PROPOFOL;  Surgeon: Dolores Frame, MD;  [facility];  Service: Gastroenterology;  Laterality: N/A;  10:30 am, pt can't move up due to transportation   FLEXIBLE SIGMOIDOSCOPY   08/06/2011    Procedure: FLEXIBLE SIGMOIDOSCOPY;  Surgeon: Malissa Hippo, MD;  [facility];  Service: Endoscopy;  Laterality: N/A;   HEMOSTASIS CLIP PLACEMENT   09/01/2022    Procedure: HEMOSTASIS CLIP PLACEMENT;  Surgeon: Dolores Frame, MD;  [facility];  Service: Gastroenterology;;   INGUINAL HERNIA REPAIR Left 1970s   IR ANGIO INTRA EXTRACRAN SEL COM CAROTID INNOMINATE BILAT MOD SED   11/01/2020   IR ANGIO VERTEBRAL SEL VERTEBRAL BILAT MOD SED   11/01/2020   IR RADIOLOGIST EVAL & MGMT   09/23/2020   IR US GUIDE VASC ACCESS RIGHT   11/01/2020   LARYNX SURGERY   1970s    Polyps excised   LEFT HEART CATH AND CORONARY ANGIOGRAPHY N/A 12/29/2019    Procedure: LEFT HEART CATH AND CORONARY ANGIOGRAPHY;  Surgeon: Corky Crafts, MD;  Location: [facility] INVASIVE CV LAB;  Service: Cardiovascular;  Laterality: N/A;   LEFT HEART CATH AND CORONARY ANGIOGRAPHY N/A 10/12/2022    Procedure: LEFT HEART CATH AND CORONARY ANGIOGRAPHY;  Surgeon: Corky Crafts, MD;  Location: [facility] INVASIVE CV LAB;  Service: Cardiovascular;  Laterality: N/A;   MALONEY DILATION   05/10/2019    Procedure: [name] DILATION;   Surgeon: Malissa Hippo, MD;  [facility];  Service: Endoscopy;;   MELANOMA EXCISION Left ~ 2016    forearm   POLYPECTOMY   05/10/2019    Procedure: POLYPECTOMY;  Surgeon: Malissa Hippo, MD;  [facility];  Service: Endoscopy;;  colon   POLYPECTOMY   09/01/2022    Procedure: POLYPECTOMY INTESTINAL;  Surgeon: Dolores Frame, MD;  [facility];  Service: Gastroenterology;;   SPLENECTOMY, TOTAL   1990s?    spontaneous rupture   SUBMUCOSAL LIFTING INJECTION   09/01/2022    Procedure: SUBMUCOSAL LIFTING  INJECTION;  Surgeon: Marguerita Merles, Reuel Boom, MD;  [facility];  Service: Gastroenterology;;   SUBMUCOSAL TATTOO INJECTION   09/01/2022    Procedure: SUBMUCOSAL TATTOO INJECTION;  Surgeon: Dolores Frame, MD;  [facility];  Service: Gastroenterology;;   TEMPORARY PACEMAKER N/A 12/29/2019    Procedure: TEMPORARY PACEMAKER;  Surgeon: Corky Crafts, MD;  Location: [facility] INVASIVE CV LAB;  Service: Cardiovascular;  Laterality: N/A;          Home Medications:  Allergies as of 02/03/2023         Reactions    Gabapentin Nausea And Vomiting    Other Reaction(s): Not available    Prednisone Palpitations    Other Reaction(s): Not available    Carvedilol      Stopped due to bradycardia    Clonidine Derivatives      Stopped due to bradycardia    Diltiazem      Stopped due to bradycardia    Tape Other (See Comments)    Causes blisters to form    Latex Other (See Comments), Rash    Blisters Other Reaction(s): Not available            Medication List           Accurate as of February 03, 2023  1:23 PM. If you have any questions, ask your nurse or doctor.              albuterol (2.5 MG/3ML) 0.083% nebulizer solution Commonly known as: PROVENTIL Take 2.5 mg by nebulization every 6 (six) hours as needed for wheezing or shortness of breath.    albuterol 108 (90 Base) MCG/ACT inhaler Commonly known  as: VENTOLIN HFA Inhale 2 puffs into the lungs every 4 (four) hours as needed for wheezing or shortness of breath.    ALLERGY EYE DROPS OP Place 1 drop into both eyes daily as needed (allergies).    amLODipine 5 MG tablet Commonly known as: NORVASC Take 1 tablet (5 mg total) by mouth daily.    aspirin EC 81 MG tablet Take 1 tablet (81 mg total) by mouth daily.    aspirin-acetaminophen-caffeine 250-250-65 MG tablet Commonly known as: EXCEDRIN MIGRAINE Take 1 tablet by mouth daily as needed for headache.    azaTHIOprine 50 MG tablet Commonly known as: IMURAN Take 1 tablet (50 mg total) by mouth daily.    carbamazepine 100 MG 12 hr capsule Commonly known as: CARBATROL TAKE ONE CAPSULE BY MOUTH 2 TIMES A DAY    cephALEXin 500 MG capsule Commonly known as: KEFLEX Take 1 capsule (500 mg total) by mouth 3 (three) times daily for 7 days.    cetirizine 10 MG tablet Commonly known as: ZYRTEC Take 10 mg by mouth daily.    clopidogrel 75 MG tablet Commonly known as: PLAVIX Take 1 tablet (75 mg total) by mouth daily.    clorazepate 3.75 MG tablet Commonly known as: TRANXENE Take 1 tablet (3.75 mg total) by mouth 2 (two) times daily as needed for anxiety.    cyclobenzaprine 5 MG tablet Commonly known as: FLEXERIL Take 1 tablet (5 mg total) by mouth at bedtime as needed for muscle spasms.    famotidine 20 MG tablet Commonly known as: PEPCID Take 1 tablet (20 mg total) by mouth at bedtime.    fluticasone 50 MCG/ACT nasal spray Commonly known as: FLONASE Place 2 sprays into both nostrils as needed for allergies.    furosemide 40 MG tablet Commonly known as: LASIX Take 1  tablet (40 mg total) by mouth daily as needed for fluid (leg/hand swelling.).    HYDROcodone-acetaminophen 10-325 MG tablet Commonly known as: NORCO Take 1 tablet by mouth every 6 (six) hours as needed for moderate pain (pain score 4-6). Start taking on: February 10, 2023    isosorbide mononitrate 120 MG  24 hr tablet Commonly known as: IMDUR Take 1 tablet (120 mg total) by mouth daily.    Jardiance 10 MG Tabs tablet Generic drug: empagliflozin Take 10 mg by mouth every morning.    levothyroxine 50 MCG tablet Commonly known as: SYNTHROID Take 50 mcg by mouth daily before breakfast.    LORazepam 0.5 MG tablet Commonly known as: Ativan Take 1-2 pills 30 minutes prior to MRI    losartan 25 MG tablet Commonly known as: COZAAR Take 1 tablet (25 mg total) by mouth daily.    naloxone 4 MG/0.1ML Liqd nasal spray kit Commonly known as: NARCAN In case of opiod overdose    nitroGLYCERIN 0.4 MG SL tablet Commonly known as: NITROSTAT Place 0.4 mg under the tongue every 5 (five) minutes as needed for chest pain.    ondansetron 4 MG tablet Commonly known as: ZOFRAN Take 1 tablet (4 mg total) by mouth 3 (three) times daily as needed.    pantoprazole 40 MG tablet Commonly known as: PROTONIX TAKE 1 TABLET BY MOUTH ONCE A DAY.    penicillin v potassium 250 MG tablet Commonly known as: VEETID Take 1 tablet (250 mg total) by mouth in the morning and at bedtime for 7 days.    potassium chloride SA 20 MEQ tablet Commonly known as: KLOR-CON M Take 1 tablet (20 mEq total) by mouth daily as needed (Take only when you take Lasix).    rosuvastatin 40 MG tablet Commonly known as: CRESTOR Take 1 tablet (40 mg total) by mouth daily.    sertraline 25 MG tablet Commonly known as: ZOLOFT Take 1 tablet (25 mg total) by mouth daily.    tamsulosin 0.4 MG Caps capsule Commonly known as: FLOMAX Take 0.4 mg by mouth daily. For Kidney Stones    ursodiol 500 MG tablet Commonly known as: ACTIGALL Take 1 tablet (500 mg total) by mouth 2 (two) times daily.    Vitamin D3 Super Strength 50 MCG (2000 UT) Caps Generic drug: Cholecalciferol Take 2,000 Units by mouth daily.             Allergies:  Allergies       Allergies  Allergen Reactions   Gabapentin Nausea And Vomiting      Other  Reaction(s): Not available   Prednisone Palpitations      Other Reaction(s): Not available   Carvedilol        Stopped due to bradycardia   Clonidine Derivatives        Stopped due to bradycardia     Diltiazem        Stopped due to bradycardia     Tape Other (See Comments)      Causes blisters to form   Latex Other (See Comments) and Rash      Blisters   Other Reaction(s): Not available        Family History:      Family History  Problem Relation Age of Onset   Heart failure Mother     Cancer Father     Heart failure Father     Cancer Sister     Dementia Sister     Heart disease Other  Arthritis Other     Cancer Other     Diabetes Other     Kidney disease Other     Cancer Sister     Heart failure Brother            Social History:  reports that she has been smoking cigarettes. She has a 20 pack-year smoking history. She has been exposed to tobacco smoke. She has never used smokeless tobacco. She reports that she does not currently use alcohol. She reports that she does not use drugs.   ROS: All other review of systems were reviewed and are negative except what is noted above in HPI   Physical Exam: BP 115/73   Pulse 94   Constitutional:  Alert and oriented, No acute distress. HEENT: [place] AT, moist mucus membranes.  Trachea midline, no masses. Cardiovascular: No clubbing, cyanosis, or edema. Respiratory: Normal respiratory effort, no increased work of breathing. GI: Abdomen is soft, nontender, nondistended, no abdominal masses GU: No CVA tenderness.  Lymph: No cervical or inguinal lymphadenopathy. Skin: No rashes, bruises or suspicious lesions. Neurologic: Grossly intact, no focal deficits, moving all 4 extremities. Psychiatric: Normal mood and affect.   Laboratory Data: Recent Labs       Lab Results  Component Value Date    WBC 9.0 01/31/2023    HGB 15.7 (H) 01/31/2023    HCT 47.4 (H) 01/31/2023    MCV 88.6 01/31/2023    PLT 336 01/31/2023         Recent Labs       Lab Results  Component Value Date    CREATININE 0.70 01/31/2023        Recent Labs  No results found for: "PSA"     Recent Labs  No results found for: "TESTOSTERONE"     Recent Labs       Lab Results  Component Value Date    HGBA1C 7.1 (H) 10/19/2022        Urinalysis Labs (Brief)           Component Value Date/Time    COLORURINE RED (A) 02/02/2023 1156    APPEARANCEUR TURBID (A) 02/02/2023 1156    APPEARANCEUR Cloudy (A) 01/21/2023 1015    LABSPEC   02/02/2023 1156      TEST NOT REPORTED DUE TO COLOR INTERFERENCE OF URINE PIGMENT    PHURINE   02/02/2023 1156      TEST NOT REPORTED DUE TO COLOR INTERFERENCE OF URINE PIGMENT    GLUCOSEU (A) 02/02/2023 1156      TEST NOT REPORTED DUE TO COLOR INTERFERENCE OF URINE PIGMENT    HGBUR (A) 02/02/2023 1156      TEST NOT REPORTED DUE TO COLOR INTERFERENCE OF URINE PIGMENT    BILIRUBINUR (A) 02/02/2023 1156      TEST NOT REPORTED DUE TO COLOR INTERFERENCE OF URINE PIGMENT    BILIRUBINUR 2+ (A) 01/21/2023 1015    KETONESUR (A) 02/02/2023 1156      TEST NOT REPORTED DUE TO COLOR INTERFERENCE OF URINE PIGMENT    PROTEINUR (A) 02/02/2023 1156      TEST NOT REPORTED DUE TO COLOR INTERFERENCE OF URINE PIGMENT    UROBILINOGEN 0.2 11/03/2014 1722    NITRITE (A) 02/02/2023 1156      TEST NOT REPORTED DUE TO COLOR INTERFERENCE OF URINE PIGMENT    LEUKOCYTESUR (A) 02/02/2023 1156      TEST NOT REPORTED DUE TO COLOR INTERFERENCE OF URINE PIGMENT        Recent  Labs       Lab Results  Component Value Date    LABMICR 4.5 10/19/2022    BACTERIA FEW (A) 02/02/2023        Pertinent Imaging: CT stone study 01/31/2023: Images reviewed and discussed with the patient  No results found for this or any previous visit.   Results for orders placed during the hospital encounter of 10/05/07   US VenoUS Imaging Bilateral   Narrative Clinical Data: Left leg pain and swelling   VENOUS DUPLEX ULTRASOUND OF  BILATERAL LOWER EXTREMITIES   Technique: Gray-scale sonography with graded compression, as well as color Doppler and duplex ultrasound, were performed to evaluate the deep venous system of both lower extremities from the level of the common femoral vein through the popliteal and proximal calf veins. Spectral Doppler was utilized to evaulate flow at rest and with distal augmentation maneuvers. Confirmed with physician's office that bilateral lower extremity exam requested.   Comparison: None   Findings: Deep venous system patent and compressible from the groin through popliteal fossa bilaterally.Marland Kitchen Spontaneous venous flow present with intact augmentation and evidence of respiratory phasicity. No intraluminal thrombus identified. Visualized portions of the greater saphenous systems unremarkable.   IMPRESSION: No evidence of deep venous thrombosis.   Provider: Morley Kos   No results found for this or any previous visit.   No results found for this or any previous visit.   Results for orders placed during the hospital encounter of 02/02/23   US Renal   Narrative CLINICAL DATA:  Painless hematuria   EXAM: RENAL / URINARY TRACT ULTRASOUND COMPLETE   COMPARISON:  CT renal stone 01/31/2023   FINDINGS: Right Kidney:   Renal measurements: 12.7 x 4.4 x 5.7 cm = volume: 161 mL. Echogenicity within normal limits. No mass or hydronephrosis visualized.   Left Kidney:   Renal measurements: 11.5 x 6.4 x 5.3 cm = volume: 205 mL. Echogenicity within normal limits. No mass or hydronephrosis visualized.   Bladder:   Under distended and not well evaluated.   Other:   None.   IMPRESSION: Normal renal ultrasound.     Electronically Signed By: Darliss Cheney M.D. On: 02/02/2023 18:00   No valid procedures specified. No results found for this or any previous visit.   Results for orders placed during the hospital encounter of 01/31/23   CT Renal Stone Study    Narrative CLINICAL DATA:  Lower abdominal and flank pain.  Gross hematuria.   EXAM: CT ABDOMEN AND PELVIS WITHOUT CONTRAST   TECHNIQUE: Multidetector CT imaging of the abdomen and pelvis was performed following the standard protocol without IV contrast.   RADIATION DOSE REDUCTION: This exam was performed according to the departmental dose-optimization program which includes automated exposure control, adjustment of the mA and/or kV according to patient size and/or use of iterative reconstruction technique.   COMPARISON:  08/04/2022   FINDINGS: Lower chest: Pulmonary nodule is seen in the medial right lower lobe, which is incompletely visualized but measures at least 13 mm in diameter. A few other tiny less than 5 mm pulmonary nodules also seen in the right lower lobe.   Hepatobiliary: No mass visualized on this unenhanced exam. Prior cholecystectomy again noted. Dilated common bile duct is unchanged, and without significant intrahepatic biliary ductal dilatation.   Pancreas: No mass or inflammatory process visualized on this unenhanced exam.   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   Adrenals/Urinary tract: No evidence of urolithiasis or  hydronephrosis. Unremarkable unopacified urinary bladder.   Stomach/Bowel: No evidence of obstruction, inflammatory process, or abnormal fluid collections. Diverticulosis is seen mainly involving the sigmoid colon, however there is no evidence of diverticulitis.   Vascular/Lymphatic: No pathologically enlarged lymph nodes identified. No evidence of abdominal aortic aneurysm.   Reproductive: Prior hysterectomy noted. Adnexal regions are unremarkable in appearance.   Other:  None.   Musculoskeletal:  No suspicious bone lesions identified.   IMPRESSION: No evidence of urolithiasis, hydronephrosis, or other acute findings.   Colonic diverticulosis, without radiographic  evidence of diverticulitis.   Several right lower lobe nodules, largest is incompletely visualized but measures at least 13 mm. Chest CT is recommended for further evaluation.     Electronically Signed By: Danae Orleans M.D. On: 01/31/2023 17:03     Assessment & Plan:     1. Bladder tumor The risks/benefits/alternatives to transurethral resection of a bladder tumor with gemcitibine instillation was explained to the patient and she understands and wishes to proceed with surgery

## 2023-03-15 NOTE — Transfer of Care (Signed)
Immediate Anesthesia Transfer of Care Note  Patient: [name]  Procedure(s) Performed: TRANSURETHRAL RESECTION OF BLADDER TUMOR (TURBT)with Gemcitibane (Urethra)  Patient Location: PACU  Anesthesia Type:General  Level of Consciousness: awake  Airway & Oxygen Therapy: Patient Spontanous Breathing and Patient connected to face mask oxygen  Post-op Assessment: Report given to RN and Post -op Vital signs reviewed and stable  Post vital signs: Reviewed and stable  Last Vitals:  Vitals Value Taken Time  BP 162/85   Temp 98.7   Pulse 84 03/15/23 1002  Resp 11 03/15/23 1002  SpO2 100 % 03/15/23 1002  Vitals shown include unfiled device data.  Last Pain:  Vitals:   03/15/23 0825  TempSrc: Oral  PainSc: 10-Worst pain ever      Patients Stated Pain Goal: 9 (03/15/23 0825)  Complications: No notable events documented.

## 2023-03-15 NOTE — Anesthesia Preprocedure Evaluation (Signed)
Anesthesia Evaluation  Patient identified by MRN, date of birth, ID band Patient awake    Reviewed: Allergy & Precautions, H&P , NPO status , Patient's Chart, lab work & pertinent test results, reviewed documented beta blocker date and time   Airway Mallampati: II  TM Distance: >3 FB Neck ROM: full    Dental no notable dental hx.    Pulmonary COPD, Current Smoker and Patient abstained from smoking.   Pulmonary exam normal breath sounds clear to auscultation       Cardiovascular Exercise Tolerance: Good hypertension, + CAD, + Past MI, + Cardiac Stents and +CHF  + dysrhythmias + Valvular Problems/Murmurs  Rhythm:regular Rate:Normal     Neuro/Psych  Headaches PSYCHIATRIC DISORDERS Anxiety Depression     Neuromuscular disease CVA    GI/Hepatic hiatal hernia,GERD  ,,(+) Hepatitis -  Endo/Other  diabetesHypothyroidism    Renal/GU negative Renal ROS  negative genitourinary   Musculoskeletal   Abdominal   Peds  Hematology negative hematology ROS (+)   Anesthesia Other Findings   Reproductive/Obstetrics negative OB ROS                             Anesthesia Physical Anesthesia Plan  ASA: 2  Anesthesia Plan: General and General LMA   Post-op Pain Management:    Induction:   PONV Risk Score and Plan: Ondansetron  Airway Management Planned:   Additional Equipment:   Intra-op Plan:   Post-operative Plan:   Informed Consent: I have reviewed the patients History and Physical, chart, labs and discussed the procedure including the risks, benefits and alternatives for the proposed anesthesia with the patient or authorized representative who has indicated his/her understanding and acceptance.     Dental Advisory Given  Plan Discussed with: CRNA  Anesthesia Plan Comments:        Anesthesia Quick Evaluation

## 2023-03-16 LAB — SURGICAL PATHOLOGY

## 2023-03-16 NOTE — Anesthesia Postprocedure Evaluation (Signed)
 Anesthesia Post Note  Patient: [name]  Procedure(s) Performed: TRANSURETHRAL RESECTION OF BLADDER TUMOR (TURBT)with Gemcitibane (Urethra)  Patient location during evaluation: Phase II Anesthesia Type: General Level of consciousness: awake Pain management: pain level controlled Vital Signs Assessment: post-procedure vital signs reviewed and stable Respiratory status: spontaneous breathing and respiratory function stable Cardiovascular status: blood pressure returned to baseline and stable Postop Assessment: no headache and no apparent nausea or vomiting Anesthetic complications: no Comments: Late entry   No notable events documented.   Last Vitals:  Vitals:   03/15/23 1056 03/15/23 1109  BP:  (!) 143/66  Pulse: 73 80  Resp: 20 20  Temp:  36.7 C  SpO2: 93% 93%    Last Pain:  Vitals:   03/15/23 1109  TempSrc: Oral  PainSc: 4                  Yvonna JINNY [name]

## 2023-03-17 ENCOUNTER — Encounter (HOSPITAL_COMMUNITY): Payer: Self-pay | Admitting: Urology

## 2023-03-19 ENCOUNTER — Other Ambulatory Visit (HOSPITAL_COMMUNITY): Payer: Self-pay | Admitting: Psychiatry

## 2023-03-19 DIAGNOSIS — F410 Panic disorder [episodic paroxysmal anxiety] without agoraphobia: Secondary | ICD-10-CM

## 2023-03-19 DIAGNOSIS — Z79899 Other long term (current) drug therapy: Secondary | ICD-10-CM

## 2023-03-22 ENCOUNTER — Telehealth: Payer: Self-pay

## 2023-03-22 ENCOUNTER — Ambulatory Visit: Payer: 59

## 2023-03-22 ENCOUNTER — Ambulatory Visit (INDEPENDENT_AMBULATORY_CARE_PROVIDER_SITE_OTHER): Payer: 59

## 2023-03-22 DIAGNOSIS — G894 Chronic pain syndrome: Secondary | ICD-10-CM

## 2023-03-22 DIAGNOSIS — R310 Gross hematuria: Secondary | ICD-10-CM

## 2023-03-22 LAB — BLADDER SCAN AMB NON-IMAGING: Scan Result: 0

## 2023-03-22 NOTE — Addendum Note (Signed)
 Addended by: Christoper Fabian R on: 03/22/2023 01:30 PM   Modules accepted: Orders

## 2023-03-22 NOTE — Progress Notes (Addendum)
 Fill and Pull Catheter Removal  Patient is present today for a catheter removal.  Patient was cleaned and prepped in a sterile fashion 100 ml of sterile water / saline was instilled into the bladder when the patient felt the urge to urinate. 20 ml of water  was then drained from the balloon.  A 20 FR  Silicone foley cath was removed from the bladder no complications were noted .  Patient as then given some time to void on their own.  Patient cannot void  0ml on their own after some time.  Patient had multiple bladder spasm and Per Lauraine, NP okay to pull patient catheter and have patient return at 130 pm for PVR check Patient tolerated well.  Performed by: Carlos, CMA  Follow up/ Additional notes: Patient is return @ 1:30 pm for PVR   Pt here today for bladder scan. Bladder was scanned and 0 was visualized.    Performed by Carlos, CMA  Additional follow up keep f/u 01/13

## 2023-03-22 NOTE — Telephone Encounter (Signed)
 Patient requesting refill on Hydrocodone Last fill per pmp  02/22/23 02/22/2023 02/01/2023 1  Hydrocodone-Acetamin 10-325 Mg 120.00 30 Yu Sht [id number] Car ([id number]) 0/0 40.00 [facility] [employer] [place]

## 2023-03-23 ENCOUNTER — Telehealth (HOSPITAL_COMMUNITY): Payer: 59 | Admitting: Psychiatry

## 2023-03-23 ENCOUNTER — Telehealth: Payer: Self-pay

## 2023-03-23 ENCOUNTER — Telehealth (HOSPITAL_COMMUNITY): Payer: Self-pay | Admitting: *Deleted

## 2023-03-23 NOTE — Telephone Encounter (Signed)
 Patient came into office stating she do not have refills for clorazepate (TRANXENE) 3.75 MG tablet. Per pt the current dose is working for her. She would like her medication to be sent to [facility].

## 2023-03-23 NOTE — Telephone Encounter (Signed)
 Left a voicemail requesting a call back to get more information on cream she is requesting.  Waiting for call back.

## 2023-03-23 NOTE — Telephone Encounter (Addendum)
 Patient left a voice message.  Needing "cream" called in to pharmacy as discussed at last visit.  [address] - [place], [place] - [address] Phone: [phone]  Fax: [phone]

## 2023-03-24 ENCOUNTER — Telehealth: Payer: Self-pay | Admitting: Urology

## 2023-03-24 ENCOUNTER — Encounter: Payer: 59 | Admitting: Obstetrics and Gynecology

## 2023-03-24 ENCOUNTER — Other Ambulatory Visit: Payer: Self-pay | Admitting: Urology

## 2023-03-24 DIAGNOSIS — B3731 Acute candidiasis of vulva and vagina: Secondary | ICD-10-CM

## 2023-03-24 MED ORDER — HYDROCODONE-ACETAMINOPHEN 10-325 MG PO TABS: 1.0000 | ORAL_TABLET | Freq: Four times a day (QID) | ORAL | 0 refills | Status: DC | PRN

## 2023-03-24 MED ORDER — CLOTRIMAZOLE 1 % EX CREA: TOPICAL_CREAM | CUTANEOUS | 0 refills | Status: DC

## 2023-03-24 NOTE — Telephone Encounter (Signed)
 Hey can you send in her Rx (Pt said it was a cream)

## 2023-03-24 NOTE — Telephone Encounter (Signed)
 Called Pt to let her know NP sent in her Rx

## 2023-03-24 NOTE — Telephone Encounter (Signed)
 Patient stated she is on auto fill with her pharmacy and they went and filled it. Per pt but she is not taking the Xanax. Per patient she called pharmacy and informed them that she do not use the Xanax anymore. Per pt she is willing to do a drug test if provider wants her to but she have not had them since provider and her agreed to stop them.

## 2023-03-24 NOTE — Telephone Encounter (Signed)
-----   Message from Lauraine JAYSON Oz sent at 03/24/2023 11:23 AM EST ----- I remember now - Dyjwpvlj asked me to see patient quickly when she came in for postop cath care. Patient had what appeared to be a candidal rash in genital area. I have just sent in Clotrimazole  topical cream. Instructions: Apply to genital rash twice per day for 7 days. If rash not resolved after 7 days she should follow up with her PCP.

## 2023-03-24 NOTE — Telephone Encounter (Signed)
 Needs cream sent to pharmacy that was supposed to be sent after visit this week

## 2023-03-24 NOTE — Telephone Encounter (Signed)
 Cream sent to pharmacy and pt notified.

## 2023-03-28 DIAGNOSIS — G894 Chronic pain syndrome: Secondary | ICD-10-CM | POA: Insufficient documentation

## 2023-03-28 DIAGNOSIS — I252 Old myocardial infarction: Secondary | ICD-10-CM | POA: Insufficient documentation

## 2023-03-28 DIAGNOSIS — K589 Irritable bowel syndrome without diarrhea: Secondary | ICD-10-CM | POA: Insufficient documentation

## 2023-03-28 DIAGNOSIS — I509 Heart failure, unspecified: Secondary | ICD-10-CM | POA: Insufficient documentation

## 2023-03-29 ENCOUNTER — Ambulatory Visit: Payer: 59 | Admitting: Urology

## 2023-03-29 ENCOUNTER — Encounter: Payer: Self-pay | Admitting: Urology

## 2023-03-29 VITALS — BP 134/71 | HR 99

## 2023-03-29 DIAGNOSIS — R310 Gross hematuria: Secondary | ICD-10-CM

## 2023-03-29 DIAGNOSIS — C672 Malignant neoplasm of lateral wall of bladder: Secondary | ICD-10-CM

## 2023-03-29 LAB — MICROSCOPIC EXAMINATION

## 2023-03-29 LAB — URINALYSIS, ROUTINE W REFLEX MICROSCOPIC
Bilirubin, UA: NEGATIVE
Ketones, UA: NEGATIVE
Nitrite, UA: NEGATIVE
Protein,UA: NEGATIVE
Specific Gravity, UA: 1.015 (ref 1.005–1.030)
Urobilinogen, Ur: 0.2 mg/dL (ref 0.2–1.0)
pH, UA: 6.5 (ref 5.0–7.5)

## 2023-03-29 NOTE — Progress Notes (Signed)
 03/29/2023 9:19 AM   [name] 1950-08-27 [id number]  Referring provider: Shona Norleen PEDLAR, MD [address] [name],  [place] [address]  Followup bladder tumor resection   HPI: [name] is a 73yo here for followup after bladder tumor resection. Pathology T1G3 without muscle involvement. Voiding trial passed. No pelvis pain   PMH: Past Medical History:  Diagnosis Date   Anxiety    Arthritis    hands, arms, back, neck, knees, fingers (01/21/2016)   Bradycardia    Breast cancer, left breast (HCC) 1970s; 2015   CAD (coronary artery disease)    a. s/p NSTEMI in 2017 with DES to OM and unsuccessful PCI to RCA with wire dissection b. 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   History of hiatal hernia    History of kidney stones    Hyperlipidemia    Hypertension    Hypothyroidism    Melanoma of forearm, left (HCC)    Migraine    a few migraines/year (01/21/2016)   Obesity    Stroke (HCC) 04/2003   2 carotid artery stents in place; small left parietal and left hemispheric CVA.hazeline 01/21/2016   Tobacco use disorder    50 pack years; questionably discontinued in 2010   Wears glasses     Surgical History: Past Surgical History:  Procedure Laterality Date   ABDOMINAL HYSTERECTOMY  1978   APPENDECTOMY     BIOPSY  04/28/2022   Procedure: BIOPSY;  Surgeon: Eartha Flavors, Toribio, MD;  [facility]: [facility];  Service: Gastroenterology;;   BREAST BIOPSY Left 1970s; 2015   BREAST LUMPECTOMY Left 1970s   BREAST LUMPECTOMY WITH NEEDLE LOCALIZATION Left 07/07/2013   Procedure: BREAST LUMPECTOMY WITH NEEDLE  LOCALIZATION;  Surgeon: Deward GORMAN Curvin DOUGLAS, MD;  Location: [facility];  Service: General;  Laterality: Left;   CARDIAC CATHETERIZATION N/A 01/22/2016   Procedure: Left Heart Cath and Coronary Angiography;  Surgeon: Peter M Jordan, MD;  Location: [facility] INVASIVE CV LAB;  Service: Cardiovascular;  Laterality: N/A;   CARDIAC CATHETERIZATION N/A 01/22/2016   Procedure: Coronary Stent Intervention;  Surgeon: Peter M Jordan, MD;  Location: [facility] INVASIVE CV LAB;  Service: Cardiovascular;  Laterality: N/A;   CAROTID STENT Left 04/2003   small left parietal and left hemispheric CVA/notes 07/29/2010   CHOLECYSTECTOMY OPEN     COLONOSCOPY  2011   COLONOSCOPY N/A 05/10/2019   Procedure: COLONOSCOPY;  Surgeon: Golda Claudis PENNER, MD;  [facility]: [facility];  Service: Endoscopy;  Laterality: N/A;  1:45   COLONOSCOPY WITH PROPOFOL  N/A 09/01/2022   Procedure: COLONOSCOPY WITH PROPOFOL ;  Surgeon: Eartha Flavors Toribio, MD;  [facility]: [facility];  Service: Gastroenterology;  Laterality: N/A;  1:45PM;ASA 3   CORONARY ATHERECTOMY N/A 12/29/2019   Procedure: CORONARY ATHERECTOMY;  Surgeon: Dann Candyce GORMAN, MD;  Location: [facility] INVASIVE CV LAB;  Service: Cardiovascular;  Laterality: N/A;  Prox RCA   CORONARY STENT INTERVENTION N/A 12/29/2019   Procedure: CORONARY STENT INTERVENTION;  Surgeon: Dann Candyce  S, MD;  Location: [facility] INVASIVE CV LAB;  Service: Cardiovascular;  Laterality: N/A;  Prox RCA   CORONARY ULTRASOUND/IVUS N/A 12/29/2019   Procedure: Intravascular Ultrasound/IVUS;  Surgeon: Dann Candyce RAMAN, MD;  Location: [facility] INVASIVE CV LAB;  Service: Cardiovascular;  Laterality: N/A;   ESOPHAGEAL DILATION N/A 04/28/2022   Procedure: ESOPHAGEAL DILATION;  Surgeon: Eartha Angelia Sieving, MD;  [facility];  Service: Gastroenterology;  Laterality: N/A;   ESOPHAGOGASTRODUODENOSCOPY  08/06/2011   Procedure: ESOPHAGOGASTRODUODENOSCOPY (EGD);  Surgeon: Claudis RAYMOND Rivet, MD;  [facility];  Service: Endoscopy;  Laterality: N/A;   ESOPHAGOGASTRODUODENOSCOPY N/A 05/10/2019   Procedure: ESOPHAGOGASTRODUODENOSCOPY (EGD);  Surgeon: Rivet Claudis RAYMOND, MD;  [facility];  Service: Endoscopy;  Laterality: N/A;   ESOPHAGOGASTRODUODENOSCOPY (EGD) WITH PROPOFOL  N/A 04/28/2022   Procedure: ESOPHAGOGASTRODUODENOSCOPY (EGD) WITH PROPOFOL ;  Surgeon: Eartha Angelia Sieving, MD;  [facility];  Service: Gastroenterology;  Laterality: N/A;  10:30 am, pt can't move up due to transportation   FLEXIBLE SIGMOIDOSCOPY  08/06/2011   Procedure: FLEXIBLE SIGMOIDOSCOPY;  Surgeon: Claudis RAYMOND Rivet, MD;  [facility]: [facility];  Service: Endoscopy;  Laterality: N/A;   HEMOSTASIS CLIP PLACEMENT  09/01/2022   Procedure: HEMOSTASIS CLIP PLACEMENT;  Surgeon: Eartha Angelia Sieving, MD;  [facility];  Service: Gastroenterology;;   INGUINAL HERNIA REPAIR Left 1970s   IR ANGIO INTRA EXTRACRAN SEL COM CAROTID INNOMINATE BILAT MOD SED  11/01/2020   IR ANGIO VERTEBRAL SEL VERTEBRAL BILAT MOD SED  11/01/2020   IR RADIOLOGIST EVAL & MGMT  09/23/2020   IR US  GUIDE VASC ACCESS RIGHT  11/01/2020   LARYNX SURGERY  1970s   Polyps excised   LEFT HEART CATH AND CORONARY ANGIOGRAPHY N/A 12/29/2019   Procedure: LEFT HEART CATH AND CORONARY ANGIOGRAPHY;  Surgeon: Dann Candyce RAMAN, MD;  Location: [facility]VAS[facility] CV LAB;  Service: Cardiovascular;  Laterality: N/A;   LEFT HEART CATH AND CORONARY ANGIOGRAPHY N/A 10/12/2022   Procedure: LEFT HEART CATH AND CORONARY ANGIOGRAPHY;  Surgeon: Dann Candyce RAMAN, MD;  Location: [facility] INVASIVE CV LAB;  Service: Cardiovascular;  Laterality: N/A;   MALONEY DILATION  05/10/2019   Procedure: AGAPITO DILATION;  Surgeon: Rivet Claudis RAYMOND, MD;  [facility];  Service: Endoscopy;;   MELANOMA EXCISION Left ~ 2016   forearm   POLYPECTOMY  05/10/2019   Procedure: POLYPECTOMY;  Surgeon: Rivet Claudis RAYMOND, MD;  [facility];   Service: Endoscopy;;  colon   POLYPECTOMY  09/01/2022   Procedure: POLYPECTOMY INTESTINAL;  Surgeon: Eartha Angelia Sieving, MD;  [facility];  Service: Gastroenterology;;   SPLENECTOMY, 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   spontaneous rupture   SPLENECTOMY, TOTAL     SUBMUCOSAL LIFTING INJECTION  09/01/2022   Procedure: SUBMUCOSAL LIFTING INJECTION;  Surgeon: Eartha Angelia Sieving, MD;  [facility];  Service: Gastroenterology;;   SUBMUCOSAL TATTOO INJECTION  09/01/2022   Procedure: SUBMUCOSAL TATTOO INJECTION;  Surgeon: Eartha Angelia Sieving, MD;  [facility];  Service: Gastroenterology;;   TEMPORARY PACEMAKER N/A 12/29/2019   Procedure: TEMPORARY PACEMAKER;  Surgeon: Dann Candyce RAMAN, MD;  Location: [facility] INVASIVE CV LAB;  Service: Cardiovascular;  Laterality: N/A;   TRANSURETHRAL RESECTION OF BLADDER TUMOR N/A 03/15/2023   Procedure: TRANSURETHRAL RESECTION OF BLADDER TUMOR (TURBT)with Gemcitibane;  Surgeon: Sherrilee Belvie CROME, MD;  Location: [facility];  Service: Urology;  Laterality: N/A;    Home Medications:  Allergies as of 03/29/2023       Reactions  Gabapentin  Nausea And Vomiting   Other Reaction(s): Not available   Prednisone  Palpitations   Other Reaction(s): Not available   Carvedilol     Stopped due to bradycardia   Clonidine Derivatives    Stopped due to bradycardia   Diltiazem     Stopped due to bradycardia   Tape Other (See Comments)   Causes blisters to form   Latex Other (See Comments), Rash   Blisters Other Reaction(s): Not available        Medication List        Accurate as of March 29, 2023  9:19 AM. If you have any questions, ask your nurse or doctor.          albuterol  108 (90 Base) MCG/ACT inhaler Commonly known as: VENTOLIN  HFA Inhale 2 puffs into the lungs every 4 (four) hours as needed for wheezing or shortness of breath.   ALLERGY EYE DROPS OP Place 1 drop into both eyes daily as needed (allergies).    amLODipine  5 MG tablet Commonly known as: NORVASC  TAKE ONE TABLET BY MOUTH EVERY DAY   aspirin  EC 81 MG tablet Take 1 tablet (81 mg total) by mouth daily.   azaTHIOprine  50 MG tablet Commonly known as: IMURAN  Take 1 tablet (50 mg total) by mouth daily.   cetirizine 10 MG tablet Commonly known as: ZYRTEC Take 10 mg by mouth daily.   clopidogrel  75 MG tablet Commonly known as: PLAVIX  Take 1 tablet (75 mg total) by mouth daily.   clorazepate  3.75 MG tablet Commonly known as: TRANXENE  Take 1 tablet (3.75 mg total) by mouth daily as needed for anxiety.   clotrimazole  1 % cream Commonly known as: Clotrimazole  Anti-Fungal Apply to genital rash twice per day for 7 days. If rash not resolved after 7 days please follow up with your primary care provider.   famotidine  20 MG tablet Commonly known as: PEPCID  Take 1 tablet (20 mg total) by mouth at bedtime.   furosemide  40 MG tablet Commonly known as: LASIX  Take 1 tablet (40 mg total) by mouth daily as needed for fluid (leg/hand swelling.).   HYDROcodone -acetaminophen  10-325 MG tablet Commonly known as: NORCO Take 1 tablet by mouth every 6 (six) hours as needed for moderate pain (pain score 4-6).   isosorbide  mononitrate 120 MG 24 hr tablet Commonly known as: IMDUR  TAKE ONE TABLET BY MOUTH EVERY DAY   Jardiance 10 MG Tabs tablet Generic drug: empagliflozin Take 10 mg by mouth every morning.   levothyroxine  50 MCG tablet Commonly known as: SYNTHROID  Take 50 mcg by mouth daily before breakfast.   losartan  25 MG tablet Commonly known as: COZAAR  Take 1 tablet (25 mg total) by mouth daily.   naloxone  4 MG/0.1ML Liqd nasal spray kit Commonly known as: NARCAN  In case of opiod overdose   nitroGLYCERIN  0.4 MG SL tablet Commonly known as: NITROSTAT  Place 0.4 mg under the tongue every 5 (five) minutes as needed for chest pain.   oxyCODONE -acetaminophen  5-325 MG tablet Commonly known as: Percocet Take 1 tablet by mouth every  4 (four) hours as needed for severe pain (pain score 7-10).   pantoprazole  40 MG tablet Commonly known as: PROTONIX  TAKE 1 TABLET BY MOUTH ONCE A DAY.   potassium chloride  SA 20 MEQ tablet Commonly known as: KLOR-CON  M Take 1 tablet (20 mEq total) by mouth daily as needed (Take only when you take Lasix ).   rosuvastatin  40 MG tablet Commonly known as: CRESTOR  Take 1 tablet (40 mg total) by mouth daily.  ursodiol  500 MG tablet Commonly known as: ACTIGALL  Take 1 tablet (500 mg total) by mouth 2 (two) times daily.   Vitamin D3 Super Strength 50 MCG (2000 UT) Caps Generic drug: Cholecalciferol Take 2,000 Units by mouth daily.        Allergies:  Allergies  Allergen Reactions   Gabapentin  Nausea And Vomiting    Other Reaction(s): Not available   Prednisone  Palpitations    Other Reaction(s): Not available   Carvedilol      Stopped due to bradycardia   Clonidine Derivatives     Stopped due to bradycardia    Diltiazem      Stopped due to bradycardia    Tape Other (See Comments)    Causes blisters to form   Latex Other (See Comments) and Rash    Blisters  Other Reaction(s): Not available    Family History: Family History  Problem Relation Age of Onset   Heart failure Mother    Cancer Father    Heart failure Father    Cancer Sister    Dementia Sister    Heart disease Other    Arthritis Other    Cancer Other    Diabetes Other    Kidney disease Other    Cancer Sister    Heart failure Brother     Social History:  reports that she has been smoking cigarettes. She has a 20 pack-year smoking history. She has been exposed to tobacco smoke. She has never used smokeless tobacco. She reports that she does not currently use alcohol. She reports that she does not use drugs.  ROS: All other review of systems were reviewed and are negative except what is noted above in HPI  Physical Exam: BP 134/71   Pulse 99   Constitutional:  Alert and oriented, No acute  distress. HEENT: [place] AT, moist mucus membranes.  Trachea midline, no masses. Cardiovascular: No clubbing, cyanosis, or edema. Respiratory: Normal respiratory effort, no increased work of breathing. GI: Abdomen is soft, nontender, nondistended, no abdominal masses GU: No CVA tenderness.  Lymph: No cervical or inguinal lymphadenopathy. Skin: No rashes, bruises or suspicious lesions. Neurologic: Grossly intact, no focal deficits, moving all 4 extremities. Psychiatric: Normal mood and affect.  Laboratory Data: Lab Results  Component Value Date   WBC 12.8 (H) 02/24/2023   HGB 13.5 02/24/2023   HCT 41.7 02/24/2023   MCV 90.5 02/24/2023   PLT 312 02/24/2023    Lab Results  Component Value Date   CREATININE 0.63 02/24/2023    No results found for: PSA  No results found for: TESTOSTERONE  Lab Results  Component Value Date   HGBA1C 7.1 (H) 10/19/2022    Urinalysis    Component Value Date/Time   COLORURINE RED (A) 02/02/2023 1156   APPEARANCEUR Cloudy (A) 02/22/2023 0853   LABSPEC  02/02/2023 1156    TEST NOT REPORTED DUE TO COLOR INTERFERENCE OF URINE PIGMENT   PHURINE  02/02/2023 1156    TEST NOT REPORTED DUE TO COLOR INTERFERENCE OF URINE PIGMENT   GLUCOSEU 3+ (A) 02/22/2023 0853   HGBUR (A) 02/02/2023 1156    TEST NOT REPORTED DUE TO COLOR INTERFERENCE OF URINE PIGMENT   BILIRUBINUR Negative 02/22/2023 0853   KETONESUR (A) 02/02/2023 1156    TEST NOT REPORTED DUE TO COLOR INTERFERENCE OF URINE PIGMENT   PROTEINUR Trace 02/22/2023 0853   PROTEINUR (A) 02/02/2023 1156    TEST NOT REPORTED DUE TO COLOR INTERFERENCE OF URINE PIGMENT   UROBILINOGEN 0.2 11/03/2014 1722  NITRITE Negative 02/22/2023 0853   NITRITE (A) 02/02/2023 1156    TEST NOT REPORTED DUE TO COLOR INTERFERENCE OF URINE PIGMENT   LEUKOCYTESUR Negative 02/22/2023 0853   LEUKOCYTESUR (A) 02/02/2023 1156    TEST NOT REPORTED DUE TO COLOR INTERFERENCE OF URINE PIGMENT    Lab Results  Component Value  Date   LABMICR See below: 02/22/2023   WBCUA 0-5 02/22/2023   LABEPIT 0-10 02/22/2023   BACTERIA Few (A) 02/22/2023    Pertinent Imaging:  No results found for this or any previous visit.  Results for orders placed during the hospital encounter of 10/05/07  US  VenoUS Imaging Bilateral  Narrative Clinical Data: Left leg pain and swelling  VENOUS DUPLEX ULTRASOUND OF BILATERAL LOWER EXTREMITIES  Technique: Gray-scale sonography with graded compression, as well as color Doppler and duplex ultrasound, were performed to evaluate the deep venous system of both lower extremities from the level of the common femoral vein through the popliteal and proximal calf veins. Spectral Doppler was utilized to evaulate flow at rest and with distal augmentation maneuvers. Confirmed with physician's office that bilateral lower extremity exam requested.  Comparison: None  Findings: Deep venous system patent and compressible from the groin through popliteal fossa bilaterally.SABRA Spontaneous venous flow present with intact augmentation and evidence of respiratory phasicity. No intraluminal thrombus identified. Visualized portions of the greater saphenous systems unremarkable.  IMPRESSION: No evidence of deep venous thrombosis.  Provider: Montie Axe  No results found for this or any previous visit.  No results found for this or any previous visit.  Results for orders placed during the hospital encounter of 02/02/23  US  Renal  Narrative CLINICAL DATA:  Painless hematuria  EXAM: RENAL / URINARY TRACT ULTRASOUND COMPLETE  COMPARISON:  CT renal stone 01/31/2023  FINDINGS: Right Kidney:  Renal measurements: 12.7 x 4.4 x 5.7 cm = volume: 161 mL. Echogenicity within normal limits. No mass or hydronephrosis visualized.  Left Kidney:  Renal measurements: 11.5 x 6.4 x 5.3 cm = volume: 205 mL. Echogenicity within normal limits. No mass or  hydronephrosis visualized.  Bladder:  Under distended and not well evaluated.  Other:  None.  IMPRESSION: Normal renal ultrasound.   Electronically Signed By: Greig Pique M.D. On: 02/02/2023 18:00  No results found for this or any previous visit.  Results for orders placed in visit on 02/03/23  CT HEMATURIA WORKUP  Narrative CLINICAL DATA:  Gross hematuria.  EXAM: CT ABDOMEN AND PELVIS WITHOUT AND WITH CONTRAST  TECHNIQUE: Multidetector CT imaging of the abdomen and pelvis was performed following the standard protocol before and following the bolus administration of intravenous contrast.  RADIATION DOSE REDUCTION: This exam was performed according to the departmental dose-optimization program which includes automated exposure control, adjustment of the mA and/or kV according to patient size and/or use of iterative reconstruction technique.  CONTRAST:  OMNIPAQUE  IOHEXOL  300 MG/ML  SOLN  COMPARISON:  CT renal stone protocol from 01/31/2023.  FINDINGS: Lower chest: There is a spiculated marginated 1.6 x 2.1 cm nodule in the right lung lower lobe, which was present but partially imaged on the recent prior exam. There is also a new pleural-based 8 x 14 mm nodule in the right lung lower lobe. Findings are concerning for neoplastic process. Further evaluation with dedicated contrast-enhanced chest CT scan is recommended. The lung bases are otherwise clear. No pleural effusion or consolidation. The heart is normal in size. No pericardial effusion.  Hepatobiliary: The liver is normal in size. Non-cirrhotic configuration. No  suspicious mass. No intrahepatic bile duct dilation. There is moderate dilation of the extrahepatic bile duct, most likely due to post cholecystectomy status. Gallbladder is surgically absent.  Pancreas: Unremarkable. No pancreatic ductal dilatation or surrounding inflammatory changes.  Spleen: Surgically absent. There is an accessory  splenule in the left upper quadrant. There are also multiple smaller hyperattenuating nodules along the left diaphragmatic undersurface, also favored to represent splenules.  Adrenals/Urinary Tract: Adrenal glands are unremarkable.  Non-contrast images: No radiopaque urinary tract calculi.  Kidneys: Symmetric enhancement. No suspicious renal mass.  Urinary Tract Opacification: Adequate.  Collecting Systems and Ureters: No filling defects, masses, strictures, or areas of abnormal dilatation.  Urinary Bladder: Is decompressed precluding optimal assessment. However, note is made of mild bladder wall mucosal hyperenhancement and subtle perivesical fat stranding, concerning for cystitis. Correlate clinically and with urinalysis. No focal mass or bladder calculi.  Stomach/Bowel: No disproportionate dilation of the small or large bowel loops. No evidence of abnormal bowel wall thickening or inflammatory changes. The appendix was not visualized; however there is no acute inflammatory process in the right lower quadrant.  Vascular/Lymphatic: No ascites or pneumoperitoneum. No abdominal or pelvic lymphadenopathy, by size criteria. No aneurysmal dilation of the major abdominal arteries. There are moderate peripheral atherosclerotic vascular calcifications of the aorta and its major branches.  Reproductive: The uterus is surgically absent. No large adnexal mass.  Other: There is a tiny fat containing umbilical hernia. The soft tissues and abdominal wall are otherwise unremarkable.  Musculoskeletal: No suspicious osseous lesions. There are mild multilevel degenerative changes in the visualized spine.  IMPRESSION: *No nephroureterolithiasis or obstructive uropathy. No suspicious renal, ureteric or urinary bladder mass. Mild urinary bladder wall mucosal hyperenhancement and subtle perivesical fat stranding, concerning for cystitis. Correlate clinically and with urinalysis. *There is a  spiculated marginated 1.6 x 2.1 cm nodule in the right lung lower lobe and an additional pleural-based 8 x 14 mm nodule in the right lung lower lobe, which is new since the recent prior exam. Findings are concerning for neoplastic process. Further evaluation with dedicated contrast-enhanced chest CT scan is recommended, unless recently performed. *Multiple other nonacute observations, as described above.  Aortic Atherosclerosis (ICD10-I70.0).   Electronically Signed By: Ree Molt M.D. On: 02/23/2023 14:38  Results for orders placed during the hospital encounter of 01/31/23  CT Renal Stone Study  Narrative CLINICAL DATA:  Lower abdominal and flank pain.  Gross hematuria.  EXAM: CT ABDOMEN AND PELVIS WITHOUT CONTRAST  TECHNIQUE: Multidetector CT imaging of the abdomen and pelvis was performed following the standard protocol without IV contrast.  RADIATION DOSE REDUCTION: This exam was performed according to the departmental dose-optimization program which includes automated exposure control, adjustment of the mA and/or kV according to patient size and/or use of iterative reconstruction technique.  COMPARISON:  08/04/2022  FINDINGS: Lower chest: Pulmonary nodule is seen in the medial right lower lobe, which is incompletely visualized but measures at least 13 mm in diameter. A few other tiny less than 5 mm pulmonary nodules also seen in the right lower lobe.  Hepatobiliary: No mass visualized on this unenhanced exam. Prior cholecystectomy again noted. Dilated common bile duct is unchanged, and without significant intrahepatic biliary ductal dilatation.  Pancreas: No mass or inflammatory process visualized on this unenhanced exam.  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  Adrenals/Urinary tract: No evidence of urolithiasis or hydronephrosis. Unremarkable unopacified urinary bladder.  Stomach/Bowel: No  evidence of obstruction, inflammatory process, or  abnormal fluid collections. Diverticulosis is seen mainly involving the sigmoid colon, however there is no evidence of diverticulitis.  Vascular/Lymphatic: No pathologically enlarged lymph nodes identified. No evidence of abdominal aortic aneurysm.  Reproductive: Prior hysterectomy noted. Adnexal regions are unremarkable in appearance.  Other:  None.  Musculoskeletal:  No suspicious bone lesions identified.  IMPRESSION: No evidence of urolithiasis, hydronephrosis, or other acute findings.  Colonic diverticulosis, without radiographic evidence of diverticulitis.  Several right lower lobe nodules, largest is incompletely visualized but measures at least 13 mm. Chest CT is recommended for further evaluation.   Electronically Signed By: Norleen DELENA Kil M.D. On: 01/31/2023 17:03   Assessment & Plan:    1. Bladder cancer We discussed the natural history of high grade bladder cancer and the treatment options including surveillance versus repeat resection versusu repeat resection and BCG therapy. After discussing the options the patient elects for repeat resection and BCG therapy - Urinalysis, Routine w reflex microscopic   No follow-ups on file.  Belvie Clara, MD  [facility] [place]

## 2023-03-29 NOTE — Patient Instructions (Signed)

## 2023-04-01 ENCOUNTER — Encounter: Payer: Self-pay | Admitting: Urology

## 2023-04-01 ENCOUNTER — Encounter: Payer: 59 | Attending: Physical Medicine & Rehabilitation | Admitting: Physical Medicine & Rehabilitation

## 2023-04-01 ENCOUNTER — Encounter: Payer: Self-pay | Admitting: Physical Medicine & Rehabilitation

## 2023-04-01 VITALS — BP 133/80 | HR 97 | Ht 62.0 in | Wt 174.0 lb

## 2023-04-01 DIAGNOSIS — M542 Cervicalgia: Secondary | ICD-10-CM | POA: Insufficient documentation

## 2023-04-01 DIAGNOSIS — M2550 Pain in unspecified joint: Secondary | ICD-10-CM | POA: Insufficient documentation

## 2023-04-01 DIAGNOSIS — G8929 Other chronic pain: Secondary | ICD-10-CM | POA: Diagnosis present

## 2023-04-01 DIAGNOSIS — F331 Major depressive disorder, recurrent, moderate: Secondary | ICD-10-CM | POA: Insufficient documentation

## 2023-04-01 DIAGNOSIS — Z79891 Long term (current) use of opiate analgesic: Secondary | ICD-10-CM | POA: Insufficient documentation

## 2023-04-01 DIAGNOSIS — G894 Chronic pain syndrome: Secondary | ICD-10-CM | POA: Insufficient documentation

## 2023-04-01 MED ORDER — HYDROCODONE-ACETAMINOPHEN 10-325 MG PO TABS: 1.0000 | ORAL_TABLET | Freq: Four times a day (QID) | ORAL | 0 refills | Status: DC | PRN

## 2023-04-01 NOTE — Progress Notes (Signed)
Subjective:    Patient ID: [name], female    DOB: 11-18-1950, 73 y.o.   MRN: [id number]  HPI    HPI 01/01/23   [name] is a 73 y.o. year old female  who  has a past medical history of Anxiety, Arthritis, Bradycardia, Breast cancer, left breast (HCC) (1970s; 2015), CAD (coronary artery disease), Cancer of skin of leg, 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HCC), Chronic lower back pain, Chronic pain, COPD (chronic obstructive pulmonary disease) (HCC), Coronary artery disease, Depression, GERD (gastroesophageal reflux disease), Heart murmur, Hepatic cirrhosis (HCC) (12/20/2017), Hepatitis, History of blood transfusion, History of hiatal hernia, History of kidney stones, Hyperlipidemia, Hypertension, Hypothyroidism, Kidney stone, Melanoma of forearm, left (HCC), Migraine, Obesity, Stroke (HCC) (04/2003), Tobacco use disorder, and Wears glasses.   They are presenting to PM&R clinic as a new patient for pain management evaluation. They were referred by Dr. Durwin Nora for treatment of chronic pain.  Patient reports she has had pain in multiple areas for many years.  Reports she had pain in her neck due to arthritis for many years.  Reports she had previous injections in her neck ESI?  X 3 about 10 years ago that did not significantly reduce her overall pain but did help the burning pain she was having in the area.  She said she was seeing a "bone doctor" and Dr. Romeo Apple.  She reports she previously had cortisone injections in her shoulders more on the left side.  Shoulder pain is doing better recently.  She reports severe swelling and pain around her first [facility] joint.  She also has pain throughout other digits of her bilateral hands due to arthritis.  Additionally she has pain in her knees primarily on the left side.  This pain is worse with ambulation.  For several months she has been having pain around her left jaw.  Reports some shock type pain over her left face.  She has a lot of popping when she  opens and closes her jaw.  Recently had MRI of her face ordered by neurology, results pending.  She also had an MRI of her brain with results pending by neurology due to left-sided weakness rule out additional CVA.   He has a history of depression and anxiety.  Denies HI or SI.  Reports that she lost her son, dog and niece about 74 and half years ago.   Red flag symptoms: No red flags for back pain endorsed in Hx or ROS   Medications tried: Topical medications - denies  Nsaids - Excedrin used in the past Tylenol  Helps for a short time  Opiates   Hydrocodone 10mg  for [employer], retired in July , now Dr. Durwin Nora Gabapentin- Allergy to this medication TCAs  nortriptyline - helping jaw pain  SNRIs  Duloxetine?- not sure      Other treatments: PT-  PT for TMJ jaw, years ago it made the pain worse    TENs unit - denies  Injections- Shoulder injections Surgery - denies  Other  heating pad helps  Interval History 02/01/23  Patient is here for follow-up regarding her chronic neck pain, left-sided facial pain, polyarthralgia.  She had an MRI face completed in October which showed left TMJ degeneration.  Discontinued nortriptyline because she did not feel like it was helping.  She continues to have left facial pain however it is doing better than it was during her past visit.  She continues to have a lot of pain in her neck and  base of her left thumb.  He also has pain in her left knee and left shoulder.  She continues to use hydrocodone to manage her pain, she has used this for years.  She is not have any side effects with the medication.  She reports poor mood, did not tolerate sertraline because she felt it made her mood worse.  She has discontinued this medication.  She reports that she has follow-up with psychiatry scheduled.  Interval history 04/01/2023 Patient is here for follow-up regarding her chronic neck, lower back, left-sided facial pain and polyarthralgia. Worst pain is in her  lower back and neck.   Since her last visit she has had treatment for bladder cancer. She was ordered oxycodone after the procedure, however only used half a pill.  She has continued using her hydrocodone 10 mg which has been helping to manage her pain.  She does feel that the hydrocodone she recently picked up from the pharmacy has a different pill shape than previously.  She feels like this version does not work as well as the prior version.  She denies any side effects to the medication.  She reports she is following up with psychiatry regarding her mood.  Denies SI or HI.  Pain Inventory Average Pain 10 Pain Right Now 7 My pain is constant, sharp, and aching  In the last 24 hours, has pain interfered with the following? General activity 9 Relation with others 9 Enjoyment of life 7 What TIME of day is your pain at its worst? night Sleep (in general) Poor  Pain is worse with: walking, sitting, standing, and some activites Pain improves with: pacing activities and medication Relief from Meds: 10  Family History  Problem Relation Age of Onset   Heart failure Mother    Cancer Father    Heart failure Father    Cancer Sister    Dementia Sister    Heart disease Other    Arthritis Other    Cancer Other    Diabetes Other    Kidney disease Other    Cancer Sister    Heart failure Brother    Social History   Socioeconomic History   Marital status: Divorced    Spouse name: Not on file   Number of children: 2   Years of education: Not on file   Highest education level: Not on file  Occupational History   Occupation: Disabled   Occupation: retired  Tobacco Use   Smoking status: Every Day    Current packs/day: 0.50    Average packs/day: 0.5 packs/day for 40.0 years (20.0 ttl pk-yrs)    Types: Cigarettes    Passive exposure: Current   Smokeless tobacco: Never  Vaping Use   Vaping status: Never Used  Substance and Sexual Activity   Alcohol use: Not Currently   Drug use:  Never   Sexual activity: Not Currently  Other Topics Concern   Not on file  Social History Narrative   ** Merged History Encounter **       Right handed   Wears glasses    Drinks 1 cup of coffee daily   Drinks sweet tea twice a week.   Social Drivers of Corporate investment banker Strain: Low Risk  (12/15/2022)   Overall Financial Resource Strain (CARDIA)    Difficulty of Paying Living Expenses: Not hard at all  Food Insecurity: No Food Insecurity (12/15/2022)   Hunger Vital Sign    Worried About Running Out of Food in the Last  Year: Never true    Ran Out of Food in the Last Year: Never true  Transportation Needs: No Transportation Needs (12/15/2022)   PRAPARE - Administrator, Civil Service (Medical): No    Lack of Transportation (Non-Medical): No  Physical Activity: Sufficiently Active (12/15/2022)   Exercise Vital Sign    Days of Exercise per Week: 7 days    Minutes of Exercise per Session: 30 min  Stress: Stress Concern Present (12/15/2022)   [employer] of Occupational Health - Occupational Stress Questionnaire    Feeling of Stress : To some extent  Social Connections: Moderately Isolated (12/15/2022)   Social Connection and Isolation Panel [NHANES]    Frequency of Communication with Friends and Family: More than three times a week    Frequency of Social Gatherings with Friends and Family: More than three times a week    Attends Religious Services: More than 4 times per year    Active Member of Clubs or Organizations: No    Attends Banker Meetings: Never    Marital Status: Divorced   Past Surgical History:  Procedure Laterality Date   ABDOMINAL HYSTERECTOMY  1978   APPENDECTOMY     BIOPSY  04/28/2022   Procedure: BIOPSY;  Surgeon: Marguerita Merles, Reuel Boom, MD;  [facility];  Service: Gastroenterology;;   BREAST BIOPSY Left 1970s; 2015   BREAST LUMPECTOMY Left 1970s   BREAST LUMPECTOMY WITH NEEDLE LOCALIZATION Left 07/07/2013    Procedure: BREAST LUMPECTOMY WITH NEEDLE LOCALIZATION;  Surgeon: Robyne Askew, MD;  Location: [facility];  Service: General;  Laterality: Left;   CARDIAC CATHETERIZATION N/A 01/22/2016   Procedure: Left Heart Cath and Coronary Angiography;  Surgeon: Peter M Swaziland, MD;  Location: [facility] INVASIVE CV LAB;  Service: Cardiovascular;  Laterality: N/A;   CARDIAC CATHETERIZATION N/A 01/22/2016   Procedure: Coronary Stent Intervention;  Surgeon: Peter M Swaziland, MD;  Location: [facility] INVASIVE CV LAB;  Service: Cardiovascular;  Laterality: N/A;   CAROTID STENT Left 04/2003   small left parietal and left hemispheric CVA/notes 07/29/2010   CHOLECYSTECTOMY OPEN     COLONOSCOPY  2011   COLONOSCOPY N/A 05/10/2019   Procedure: COLONOSCOPY;  Surgeon: Malissa Hippo, MD;  [facility];  Service: Endoscopy;  Laterality: N/A;  1:45   COLONOSCOPY WITH PROPOFOL N/A 09/01/2022   Procedure: COLONOSCOPY WITH PROPOFOL;  Surgeon: Dolores Frame, MD;  [facility];  Service: Gastroenterology;  Laterality: N/A;  1:45PM;ASA 3   CORONARY ATHERECTOMY N/A 12/29/2019   Procedure: CORONARY ATHERECTOMY;  Surgeon: Corky Crafts, MD;  Location: [facility] INVASIVE CV LAB;  Service: Cardiovascular;  Laterality: N/A;  Prox RCA   CORONARY STENT INTERVENTION N/A 12/29/2019   Procedure: CORONARY STENT INTERVENTION;  Surgeon: Corky Crafts, MD;  Location: [facility] INVASIVE CV LAB;  Service: Cardiovascular;  Laterality: N/A;  Prox RCA   CORONARY ULTRASOUND/IVUS N/A 12/29/2019   Procedure: Intravascular Ultrasound/IVUS;  Surgeon: Corky Crafts, MD;  Location: [facility] INVASIVE CV LAB;  Service: Cardiovascular;  Laterality: N/A;   ESOPHAGEAL DILATION N/A 04/28/2022   Procedure: ESOPHAGEAL DILATION;  Surgeon: Dolores Frame, MD;  [facility];  Service: Gastroenterology;  Laterality: N/A;   ESOPHAGOGASTRODUODENOSCOPY  08/06/2011   Procedure: ESOPHAGOGASTRODUODENOSCOPY  (EGD);  Surgeon: Malissa Hippo, MD;  [facility];  Service: Endoscopy;  Laterality: N/A;   ESOPHAGOGASTRODUODENOSCOPY N/A 05/10/2019   Procedure: ESOPHAGOGASTRODUODENOSCOPY (EGD);  Surgeon: Malissa Hippo, MD;  [facility];  Service: Endoscopy;  Laterality: N/A;   ESOPHAGOGASTRODUODENOSCOPY (EGD) WITH PROPOFOL N/A 04/28/2022   Procedure: ESOPHAGOGASTRODUODENOSCOPY (EGD) WITH PROPOFOL;  Surgeon: Dolores Frame, MD;  [facility];  Service: Gastroenterology;  Laterality: N/A;  10:30 am, pt can't move up due to transportation   FLEXIBLE SIGMOIDOSCOPY  08/06/2011   Procedure: FLEXIBLE SIGMOIDOSCOPY;  Surgeon: Malissa Hippo, MD;  [facility];  Service: Endoscopy;  Laterality: N/A;   HEMOSTASIS CLIP PLACEMENT  09/01/2022   Procedure: HEMOSTASIS CLIP PLACEMENT;  Surgeon: Dolores Frame, MD;  [facility];  Service: Gastroenterology;;   INGUINAL HERNIA REPAIR Left 1970s   IR ANGIO INTRA EXTRACRAN SEL COM CAROTID INNOMINATE BILAT MOD SED  11/01/2020   IR ANGIO VERTEBRAL SEL VERTEBRAL BILAT MOD SED  11/01/2020   IR RADIOLOGIST EVAL & MGMT  09/23/2020   IR US GUIDE VASC ACCESS RIGHT  11/01/2020   LARYNX SURGERY  1970s   Polyps excised   LEFT HEART CATH AND CORONARY ANGIOGRAPHY N/A 12/29/2019   Procedure: LEFT HEART CATH AND CORONARY ANGIOGRAPHY;  Surgeon: Corky Crafts, MD;  Location: [facility] INVASIVE CV LAB;  Service: Cardiovascular;  Laterality: N/A;   LEFT HEART CATH AND CORONARY ANGIOGRAPHY N/A 10/12/2022   Procedure: LEFT HEART CATH AND CORONARY ANGIOGRAPHY;  Surgeon: Corky Crafts, MD;  Location: [facility] INVASIVE CV LAB;  Service: Cardiovascular;  Laterality: N/A;   MALONEY DILATION  05/10/2019   Procedure: [name] DILATION;  Surgeon: Malissa Hippo, MD;  [facility];  Service: Endoscopy;;   MELANOMA EXCISION Left ~ 2016   forearm   POLYPECTOMY  05/10/2019   Procedure: POLYPECTOMY;  Surgeon:  Malissa Hippo, MD;  [facility];  Service: Endoscopy;;  colon   POLYPECTOMY  09/01/2022   Procedure: POLYPECTOMY INTESTINAL;  Surgeon: Dolores Frame, MD;  [facility];  Service: Gastroenterology;;   SPLENECTOMY, 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   spontaneous rupture   SPLENECTOMY, TOTAL     SUBMUCOSAL LIFTING INJECTION  09/01/2022   Procedure: SUBMUCOSAL LIFTING INJECTION;  Surgeon: Dolores Frame, MD;  [facility];  Service: Gastroenterology;;   SUBMUCOSAL TATTOO INJECTION  09/01/2022   Procedure: SUBMUCOSAL TATTOO INJECTION;  Surgeon: Dolores Frame, MD;  [facility];  Service: Gastroenterology;;   TEMPORARY PACEMAKER N/A 12/29/2019   Procedure: TEMPORARY PACEMAKER;  Surgeon: Corky Crafts, MD;  Location: [facility] INVASIVE CV LAB;  Service: Cardiovascular;  Laterality: N/A;   TRANSURETHRAL RESECTION OF BLADDER TUMOR N/A 03/15/2023   Procedure: TRANSURETHRAL RESECTION OF BLADDER TUMOR (TURBT)with Gemcitibane;  Surgeon: Malen Gauze, MD;  Location: [facility];  Service: Urology;  Laterality: N/A;   Past Surgical History:  Procedure Laterality Date   ABDOMINAL HYSTERECTOMY  1978   APPENDECTOMY     BIOPSY  04/28/2022   Procedure: BIOPSY;  Surgeon: Marguerita Merles, Reuel Boom, MD;  [facility];  Service: Gastroenterology;;   BREAST BIOPSY Left 1970s; 2015   BREAST LUMPECTOMY Left 1970s   BREAST LUMPECTOMY WITH NEEDLE LOCALIZATION Left 07/07/2013   Procedure: BREAST LUMPECTOMY WITH NEEDLE LOCALIZATION;  Surgeon: Robyne Askew, MD;  Location: [facility];  Service: General;  Laterality: Left;   CARDIAC CATHETERIZATION N/A 01/22/2016   Procedure: Left Heart Cath and Coronary Angiography;  Surgeon: Peter M Swaziland, MD;  Location: [facility] INVASIVE CV LAB;  Service: Cardiovascular;  Laterality: N/A;   CARDIAC CATHETERIZATION N/A 01/22/2016   Procedure: Coronary Stent Intervention;  Surgeon:  Peter M Swaziland,  MD;  Location: [facility] INVASIVE CV LAB;  Service: Cardiovascular;  Laterality: N/A;   CAROTID STENT Left 04/2003   small left parietal and left hemispheric CVA/notes 07/29/2010   CHOLECYSTECTOMY OPEN     COLONOSCOPY  2011   COLONOSCOPY N/A 05/10/2019   Procedure: COLONOSCOPY;  Surgeon: Malissa Hippo, MD;  [facility];  Service: Endoscopy;  Laterality: N/A;  1:45   COLONOSCOPY WITH PROPOFOL N/A 09/01/2022   Procedure: COLONOSCOPY WITH PROPOFOL;  Surgeon: Dolores Frame, MD;  [facility];  Service: Gastroenterology;  Laterality: N/A;  1:45PM;ASA 3   CORONARY ATHERECTOMY N/A 12/29/2019   Procedure: CORONARY ATHERECTOMY;  Surgeon: Corky Crafts, MD;  Location: [facility] INVASIVE CV LAB;  Service: Cardiovascular;  Laterality: N/A;  Prox RCA   CORONARY STENT INTERVENTION N/A 12/29/2019   Procedure: CORONARY STENT INTERVENTION;  Surgeon: Corky Crafts, MD;  Location: [facility] INVASIVE CV LAB;  Service: Cardiovascular;  Laterality: N/A;  Prox RCA   CORONARY ULTRASOUND/IVUS N/A 12/29/2019   Procedure: Intravascular Ultrasound/IVUS;  Surgeon: Corky Crafts, MD;  Location: [facility] INVASIVE CV LAB;  Service: Cardiovascular;  Laterality: N/A;   ESOPHAGEAL DILATION N/A 04/28/2022   Procedure: ESOPHAGEAL DILATION;  Surgeon: Dolores Frame, MD;  [facility];  Service: Gastroenterology;  Laterality: N/A;   ESOPHAGOGASTRODUODENOSCOPY  08/06/2011   Procedure: ESOPHAGOGASTRODUODENOSCOPY (EGD);  Surgeon: Malissa Hippo, MD;  [facility];  Service: Endoscopy;  Laterality: N/A;   ESOPHAGOGASTRODUODENOSCOPY N/A 05/10/2019   Procedure: ESOPHAGOGASTRODUODENOSCOPY (EGD);  Surgeon: Malissa Hippo, MD;  [facility];  Service: Endoscopy;  Laterality: N/A;   ESOPHAGOGASTRODUODENOSCOPY (EGD) WITH PROPOFOL N/A 04/28/2022   Procedure: ESOPHAGOGASTRODUODENOSCOPY (EGD) WITH PROPOFOL;  Surgeon: Dolores Frame, MD;  [facility];  Service: Gastroenterology;  Laterality: N/A;  10:30 am, pt can't move up due to transportation   FLEXIBLE SIGMOIDOSCOPY  08/06/2011   Procedure: FLEXIBLE SIGMOIDOSCOPY;  Surgeon: Malissa Hippo, MD;  [facility];  Service: Endoscopy;  Laterality: N/A;   HEMOSTASIS CLIP PLACEMENT  09/01/2022   Procedure: HEMOSTASIS CLIP PLACEMENT;  Surgeon: Dolores Frame, MD;  [facility];  Service: Gastroenterology;;   INGUINAL HERNIA REPAIR Left 1970s   IR ANGIO INTRA EXTRACRAN SEL COM CAROTID INNOMINATE BILAT MOD SED  11/01/2020   IR ANGIO VERTEBRAL SEL VERTEBRAL BILAT MOD SED  11/01/2020   IR RADIOLOGIST EVAL & MGMT  09/23/2020   IR US GUIDE VASC ACCESS RIGHT  11/01/2020   LARYNX SURGERY  1970s   Polyps excised   LEFT HEART CATH AND CORONARY ANGIOGRAPHY N/A 12/29/2019   Procedure: LEFT HEART CATH AND CORONARY ANGIOGRAPHY;  Surgeon: Corky Crafts, MD;  Location: [facility];  Service: Cardiovascular;  Laterality: N/A;   LEFT HEART CATH AND CORONARY ANGIOGRAPHY N/A 10/12/2022   Procedure: LEFT HEART CATH AND CORONARY ANGIOGRAPHY;  Surgeon: Corky Crafts, MD;  Location: [facility] INVASIVE CV LAB;  Service: Cardiovascular;  Laterality: N/A;   MALONEY DILATION  05/10/2019   Procedure: [name] DILATION;  Surgeon: Malissa Hippo, MD;  [facility];  Service: Endoscopy;;   MELANOMA EXCISION Left ~ 2016   forearm   POLYPECTOMY  05/10/2019   Procedure: POLYPECTOMY;  Surgeon: Malissa Hippo, MD;  [facility];  Service: Endoscopy;;  colon   POLYPECTOMY  09/01/2022   Procedure: POLYPECTOMY INTESTINAL;  Surgeon: Dolores Frame, MD;  [facility];  Service:  Gastroenterology;;   SPLENECTOMY, 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   spontaneous rupture   SPLENECTOMY, TOTAL     SUBMUCOSAL LIFTING INJECTION  09/01/2022   Procedure: SUBMUCOSAL LIFTING INJECTION;  Surgeon: Dolores Frame, MD;  [facility];  Service:  Gastroenterology;;   SUBMUCOSAL TATTOO INJECTION  09/01/2022   Procedure: SUBMUCOSAL TATTOO INJECTION;  Surgeon: Dolores Frame, MD;  [facility];  Service: Gastroenterology;;   TEMPORARY PACEMAKER N/A 12/29/2019   Procedure: TEMPORARY PACEMAKER;  Surgeon: Corky Crafts, MD;  Location: [facility] INVASIVE CV LAB;  Service: Cardiovascular;  Laterality: N/A;   TRANSURETHRAL RESECTION OF BLADDER TUMOR N/A 03/15/2023   Procedure: TRANSURETHRAL RESECTION OF BLADDER TUMOR (TURBT)with Gemcitibane;  Surgeon: Malen Gauze, MD;  Location: [facility];  Service: Urology;  Laterality: N/A;   Past Medical History:  Diagnosis Date   Anxiety    Arthritis    "hands, arms, back, neck, knees, fingers" (01/21/2016)   Bradycardia    Breast cancer, left breast (HCC) 1970s; 2015   CAD (coronary artery disease)    a. s/p NSTEMI in 2017 with DES to OM and unsuccessful PCI to RCA with wire dissection b. DES to RCA in 2021   Cancer of skin of leg    BLE   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HCC)    left   Chronic lower back pain    Chronic pain    COPD (chronic obstructive pulmonary disease) (HCC)    Coronary artery disease    Depression    GERD (gastroesophageal reflux disease)    Heart murmur    Hepatic cirrhosis (HCC) 12/20/2017   Hepatitis    History of blood transfusion    "related to spleen"   History of hiatal hernia    History of kidney stones    Hyperlipidemia    Hypertension    Hypothyroidism    Melanoma of forearm, left (HCC)    Migraine    "a few migraines/year" (01/21/2016)   Obesity    Stroke (HCC) 04/2003   2 carotid artery stents in place; small left parietal and left hemispheric CVA.[name] 01/21/2016   Tobacco use disorder    50 pack years; questionably discontinued in 2010   Wears glasses    BP 133/80   Pulse 97   Ht 5\' 2"  (1.575 m)   Wt 174 lb (78.9 kg)   PF 89 L/min   BMI 31.83 kg/m   Opioid Risk Score:   Fall Risk Score:  `1  Depression screen [facility]  2/9     04/01/2023   12:48 PM 02/01/2023    9:35 AM 01/21/2023    9:33 AM 01/01/2023   11:22 AM 12/15/2022    2:52 PM 12/10/2022   12:29 PM 11/05/2022    2:02 PM  Depression screen PHQ 2/9  Decreased Interest 0 3 1 0 1  1  Down, Depressed, Hopeless 0 3 3 2 1   0  PHQ - 2 Score 0 6 4 2 2  1   Altered sleeping   3 2 0  1  Tired, decreased energy   3 3 2  1   Change in appetite   1 0 2  1  Feeling bad or failure about yourself    0 1 0  0  Trouble concentrating   0 0 0  0  Moving slowly or fidgety/restless   0 0 0  0  Suicidal thoughts    0 0  0  PHQ-9 Score   11 8 6  4  Difficult doing work/chores   Somewhat difficult  Somewhat difficult  Not difficult at all     Information is confidential and restricted. Go to Review Flowsheets to unlock data.     Review of Systems  Constitutional: Negative.   HENT: Negative.    Eyes: Negative.   Respiratory: Negative.    Cardiovascular: Negative.   Gastrointestinal: Negative.   Endocrine: Negative.   Genitourinary: Negative.   Musculoskeletal:  Positive for arthralgias and back pain.  Skin: Negative.   Allergic/Immunologic: Negative.   Neurological: Negative.   Hematological: Negative.   Psychiatric/Behavioral:  Positive for dysphoric mood.   All other systems reviewed and are negative.      Objective:   Physical Exam   Gen: no distress, normal appearing HEENT: oral mucosa pink and moist, NCAT Chest: normal effort, normal rate of breathing Abd: soft, non-distended Ext: no edema Psych: pleasant, normal affect Skin: intact Neuro: Alert and oriented x3, follows simple commands, fluent speech, comprehension intact Poor memory regarding medication use Decreased/alerted sensation L face Facial movements intact b/l Strength 5/5 RUE and RLE Strength 4/5 LUE and LLE Musculoskeletal:  Arthritic changes in b/l hands noted L knee tenderness to palpation Mild B/L hand tenderness  L 1st CMC pain with palpation and PROM, mild swelling  around this joint + L TMJ tenderness and clicking  + TTP L spine and C spine paraspinal muscles + trapezius tenderness to palpation, Mild L shoulder tenderness Spurling's- neck pain resulted  Slump test negative    Xray R hand  2020 Right hand x-rays   Right wrist pain   No fracture dislocation or malalignment joint spaces seem preserved   Impression normal x-ray of the right hand   Xray L hand  2020  Left hand pain swelling numbness tingling   AP lateral oblique of the left hand no fracture dislocation or bony arthritis is seen.  Alignment looks normal.   Impression normal left hand x-ray   L knee Xray  12/25/21 On my read appears to show evidence  mild OA     C spine xary 2017 FINDINGS: Bones are osteopenic. Advanced spondylosis and degenerative disc disease at C5-6 with disc space loss, sclerosis and large osteophytes mild degenerative changes at other levels. Mild diffuse facet arthropathy. Facets appear aligned. Foraminal encroachment suspected of the left C5-6 foramen related to bony spurring. Trachea is midline. Odontoid is intact.   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   No acute osseous finding by plain radiography.       CT C spine 2016 IMPRESSION: 1. Cervical spine degenerative changes. 2. Straightening of the normal cervical lordosis. 3. 9 mm right lobe thyroid nodule, too small to characterize, but most likely benign in the absence of known clinical risk factors for thyroid carcinoma.   MRI Face and brain 12/23/22 Brain: No evidence of acute infarct, intracranial hemorrhage, mass, midline shift or extra-axial fluid collection. No pathologic enhancement. Mild for age chronic microvascular ischemic change.   Trigeminal nerves: No mass is identified along the course of the trigeminal nerves on thin-slice imaging. No evidence of neurovascular compression.    Vascular: Major arterial flow voids are maintained at the skull base.   Sinuses/Orbits: Clear sinuses. No acute or significant orbital findings.   Soft tissues: Unremarkable.  No visible edema or mass lesion.   Osseous: Normal marrow signal without focal lesion. TMJs are located. Left TMJ degenerative change.   Other: None.  IMPRESSION: 1. No evidence of acute abnormality intracranially or in the face. 2. Left TMJ degenerative change.   '  02/01/23 Xray thumb left FINDINGS: Bones are osteopenic. Normal alignment without acute osseous finding or fracture. Moderate degenerative arthropathy of the left first CMC joint, first MCP joint, and the thumb interphalangeal joint. Soft tissues unremarkable.   IMPRESSION: Osteopenia and degenerative changes as above. No acute finding by plain radiography.   Assessment & Plan:   1) Chronic neck pain with prior imaging L spine spondylosis 2) Polyarthralgia with L knee pain, L shoulder pain, b/l hand pain  3) L sided facial pain likely due to TMJ pain.             -MRI brain and face with evidence of left TMJ 4) Prior CVA 5) L 1st digit OA 6) Depression, denies SI or HI 7) Bladder cancer  8) Chronic lower back pain    -Order L thumb xray-advised her to complete this -Ordered TENS unit, Nexwave last visit- hold due to bladder cancer  -Chronic use of Norco 10 for years by prior PCP who has retired -Continue Norco 10mg  QID PRN.  Consider weaning down at later time -Discussed risks of opiod medications, increased with benzodiazapine use. She is weaning down on this with her MH provider -Patient discontinued nortriptyline since her last visit she did not feel like this was helping -Continue UDS and pill counts.  Continue PDMP monitoring.  Pain contract completed prior visit. -Discussed bringing pill bottle with any medications even if empty to all appointments -Pill counts consistent  -Patient reports she is seeing psychiatry- weaning  clorazepate -Consider Cymbalta -She reports trouble with distance to [facility], She may try to find clinic closer to home -Will have clinic dispose of oxycodone since she is continuing on hydrocodone  -Discussed calling our clinic before any surgeries if any different pain medications might be needed -Consider imaging if LBP does not improved

## 2023-04-07 ENCOUNTER — Telehealth: Payer: Self-pay | Admitting: Psychiatry

## 2023-04-07 NOTE — Telephone Encounter (Signed)
[name] with [facility] calling to confirm fax number

## 2023-04-08 ENCOUNTER — Ambulatory Visit: Payer: 59 | Admitting: Cardiology

## 2023-04-09 ENCOUNTER — Encounter: Payer: Self-pay | Admitting: Cardiology

## 2023-04-09 ENCOUNTER — Ambulatory Visit: Payer: 59 | Attending: Cardiology | Admitting: Cardiology

## 2023-04-09 VITALS — BP 138/82 | HR 76 | Ht 63.0 in | Wt 177.0 lb

## 2023-04-09 DIAGNOSIS — I25118 Atherosclerotic heart disease of native coronary artery with other forms of angina pectoris: Secondary | ICD-10-CM | POA: Diagnosis not present

## 2023-04-09 DIAGNOSIS — E782 Mixed hyperlipidemia: Secondary | ICD-10-CM | POA: Diagnosis not present

## 2023-04-09 DIAGNOSIS — I10 Essential (primary) hypertension: Secondary | ICD-10-CM | POA: Diagnosis not present

## 2023-04-09 DIAGNOSIS — I5032 Chronic diastolic (congestive) heart failure: Secondary | ICD-10-CM

## 2023-04-09 NOTE — Patient Instructions (Signed)
Medication Instructions:  Continue all current medications.   Labwork: none  Testing/Procedures: none  Follow-Up: 6 months   Any Other Special Instructions Will Be Listed Below (If Applicable).   If you need a refill on your cardiac medications before your next appointment, please call your pharmacy.

## 2023-04-09 NOTE — Progress Notes (Signed)
Clinical Summary [name] is a 73 y.o.female seen today for follow up of the following medical problems.    1. CAD with stable angina - admit 01/2016 with NSTEMI. Full cath report below. Received DES to OM. Unsuccesful PCI to RCA with wire induced dissection, managed medically. The OM was thought to be the culprit. If recurring symptoms can consider repeat RCA intervention. .      12/2019 cath DES to RCA. RCA was prone to vasospasm during procedure   08/2020 nuclear stress: breast attenuatin vs mild anterior ischemia, low risk     10/02/22 seen in clinic with accelerating chest pains - 09/2022 cath: LAD 20%, ramus 50%, D1 50%, patent RCA and OM stent, normal LVEDP - intermittent chest tightness, left arm ache. Resolves 5-10 minutes after NG.    - no recent chest pains    2. COPD - 04/2018 admission with COPD exacerbation       3. Chronic diastolic HF - some recent edema, R>L. Prior pattern, she had [name] 2020 right leg no DVT - fluid improved with diuretic. Taking lasix prn, takes about 3 times a week.      4. HTN - had some bilateral LE edema 12/2019, norvasc was lowered to 5mg  daily.  -compliant with meds  - home bp's 120s/80s   4. Bradycardia - admit 08/2016 with bradycardia - resolved off coreg   5. History of CVA - 2005 at [facility] - she reports history of stents at the time . Followed by Dr Corliss Skains, 10/2020 monitoring cerebral angiogram.      6. Hyperlipedemia 10/2022 TC 132 TG 104 HDL 42 LDL 71   7. 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 Bladder cancer - followed by urology   SH: Son is on dialysis and has had other health issues, has been very stressful. Son passed Jul 23 2021 Past Medical History:  Diagnosis Date   Anxiety    Arthritis    "hands, arms, back, neck, knees, fingers" (01/21/2016)   Bradycardia    Breast cancer, left breast (HCC) 1970s; 2015   CAD (coronary artery disease)    a. s/p  NSTEMI in 2017 with DES to OM and unsuccessful PCI to RCA with wire dissection b. DES to RCA in 2021   Cancer of skin of leg    BLE   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HCC)    left   Chronic lower back pain    Chronic pain    COPD (chronic obstructive pulmonary disease) (HCC)    Coronary artery disease    Depression    GERD (gastroesophageal reflux disease)    Heart murmur    Hepatic cirrhosis (HCC) 12/20/2017   Hepatitis    History of blood transfusion    "related to spleen"   History of hiatal hernia    History of kidney stones    Hyperlipidemia    Hypertension    Hypothyroidism    Melanoma of forearm, left (HCC)    Migraine    "a few migraines/year" (01/21/2016)   Obesity    Stroke (HCC) 04/2003   2 carotid artery stents in place; small left parietal and left hemispheric CVA.[name] 01/21/2016   Tobacco use disorder    50 pack years; questionably discontinued in 2010   Wears glasses      Allergies  Allergen Reactions   Gabapentin Nausea And Vomiting    Other Reaction(s): Not available  Prednisone Palpitations    Other Reaction(s): Not available   Carvedilol     Stopped due to bradycardia   Clonidine Derivatives     Stopped due to bradycardia    Diltiazem     Stopped due to bradycardia    Tape Other (See Comments)    Causes blisters to form   Latex Other (See Comments) and Rash    Blisters  Other Reaction(s): Not available     Current Outpatient Medications  Medication Sig Dispense Refill   albuterol (PROVENTIL HFA;VENTOLIN HFA) 108 (90 Base) MCG/ACT inhaler Inhale 2 puffs into the lungs every 4 (four) hours as needed for wheezing or shortness of breath.     amLODipine (NORVASC) 5 MG tablet TAKE ONE TABLET BY MOUTH EVERY DAY 90 tablet 3   aspirin EC 81 MG tablet Take 1 tablet (81 mg total) by mouth daily.     azaTHIOprine (IMURAN) 50 MG tablet Take 1 tablet (50 mg total) by mouth daily. 90 tablet 2   cetirizine (ZYRTEC) 10 MG tablet Take 10 mg by mouth daily.      Cholecalciferol (VITAMIN D3 SUPER STRENGTH) 50 MCG (2000 UT) CAPS Take 2,000 Units by mouth daily.     clopidogrel (PLAVIX) 75 MG tablet Take 1 tablet (75 mg total) by mouth daily. 90 tablet 3   clorazepate (TRANXENE) 3.75 MG tablet Take 1 tablet (3.75 mg total) by mouth daily as needed for anxiety. 30 tablet 0   clotrimazole (CLOTRIMAZOLE ANTI-FUNGAL) 1 % cream Apply to genital rash twice per day for 7 days. If rash not resolved after 7 days please follow up with your primary care provider. 30 g 0   famotidine (PEPCID) 20 MG tablet Take 1 tablet (20 mg total) by mouth at bedtime. 30 tablet 11   furosemide (LASIX) 40 MG tablet Take 1 tablet (40 mg total) by mouth daily as needed for fluid (leg/hand swelling.). 90 tablet 3   HYDROcodone-acetaminophen (NORCO) 10-325 MG tablet Take 1 tablet by mouth every 6 (six) hours as needed for moderate pain (pain score 4-6). 120 tablet 0   isosorbide mononitrate (IMDUR) 120 MG 24 hr tablet TAKE ONE TABLET BY MOUTH EVERY DAY 90 tablet 0   JARDIANCE 10 MG TABS tablet Take 10 mg by mouth every morning.     Ketotifen Fumarate (ALLERGY EYE DROPS OP) Place 1 drop into both eyes daily as needed (allergies).     levothyroxine (SYNTHROID, LEVOTHROID) 50 MCG tablet Take 50 mcg by mouth daily before breakfast.      losartan (COZAAR) 25 MG tablet Take 1 tablet (25 mg total) by mouth daily. 90 tablet 3   naloxone (NARCAN) nasal spray 4 mg/0.1 mL In case of opiod overdose 1 each 0   nitroGLYCERIN (NITROSTAT) 0.4 MG SL tablet Place 0.4 mg under the tongue every 5 (five) minutes as needed for chest pain.       oxyCODONE-acetaminophen (PERCOCET) 5-325 MG tablet Take 1 tablet by mouth every 4 (four) hours as needed for severe pain (pain score 7-10). 30 tablet 0   pantoprazole (PROTONIX) 40 MG tablet TAKE 1 TABLET BY MOUTH ONCE A DAY. 90 tablet 1   potassium chloride SA (KLOR-CON M) 20 MEQ tablet Take 1 tablet (20 mEq total) by mouth daily as needed (Take only when you take  Lasix). 30 tablet 11   rosuvastatin (CRESTOR) 40 MG tablet Take 1 tablet (40 mg total) by mouth daily. 90 tablet 3   ursodiol (ACTIGALL) 500 MG tablet Take 1  tablet (500 mg total) by mouth 2 (two) times daily. 180 tablet 3   No current facility-administered medications for this visit.     Past Surgical History:  Procedure Laterality Date   ABDOMINAL HYSTERECTOMY  1978   APPENDECTOMY     BIOPSY  04/28/2022   Procedure: BIOPSY;  Surgeon: Marguerita Merles, Reuel Boom, MD;  [facility];  Service: Gastroenterology;;   BREAST BIOPSY Left 1970s; 2015   BREAST LUMPECTOMY Left 1970s   BREAST LUMPECTOMY WITH NEEDLE LOCALIZATION Left 07/07/2013   Procedure: BREAST LUMPECTOMY WITH NEEDLE LOCALIZATION;  Surgeon: Robyne Askew, MD;  Location: [facility];  Service: General;  Laterality: Left;   CARDIAC CATHETERIZATION N/A 01/22/2016   Procedure: Left Heart Cath and Coronary Angiography;  Surgeon: Peter M Swaziland, MD;  Location: [facility] INVASIVE CV LAB;  Service: Cardiovascular;  Laterality: N/A;   CARDIAC CATHETERIZATION N/A 01/22/2016   Procedure: Coronary Stent Intervention;  Surgeon: Peter M Swaziland, MD;  Location: [facility] INVASIVE CV LAB;  Service: Cardiovascular;  Laterality: N/A;   CAROTID STENT Left 04/2003   small left parietal and left hemispheric CVA/notes 07/29/2010   CHOLECYSTECTOMY OPEN     COLONOSCOPY  2011   COLONOSCOPY N/A 05/10/2019   Procedure: COLONOSCOPY;  Surgeon: Malissa Hippo, MD;  [facility];  Service: Endoscopy;  Laterality: N/A;  1:45   COLONOSCOPY WITH PROPOFOL N/A 09/01/2022   Procedure: COLONOSCOPY WITH PROPOFOL;  Surgeon: Dolores Frame, MD;  [facility];  Service: Gastroenterology;  Laterality: N/A;  1:45PM;ASA 3   CORONARY ATHERECTOMY N/A 12/29/2019   Procedure: CORONARY ATHERECTOMY;  Surgeon: Corky Crafts, MD;  Location: [facility] INVASIVE CV LAB;  Service: Cardiovascular;  Laterality: N/A;  Prox RCA   CORONARY  STENT INTERVENTION N/A 12/29/2019   Procedure: CORONARY STENT INTERVENTION;  Surgeon: Corky Crafts, MD;  Location: [facility] INVASIVE CV LAB;  Service: Cardiovascular;  Laterality: N/A;  Prox RCA   CORONARY ULTRASOUND/IVUS N/A 12/29/2019   Procedure: Intravascular Ultrasound/IVUS;  Surgeon: Corky Crafts, MD;  Location: [facility] INVASIVE CV LAB;  Service: Cardiovascular;  Laterality: N/A;   ESOPHAGEAL DILATION N/A 04/28/2022   Procedure: ESOPHAGEAL DILATION;  Surgeon: Dolores Frame, MD;  [facility];  Service: Gastroenterology;  Laterality: N/A;   ESOPHAGOGASTRODUODENOSCOPY  08/06/2011   Procedure: ESOPHAGOGASTRODUODENOSCOPY (EGD);  Surgeon: Malissa Hippo, MD;  [facility];  Service: Endoscopy;  Laterality: N/A;   ESOPHAGOGASTRODUODENOSCOPY N/A 05/10/2019   Procedure: ESOPHAGOGASTRODUODENOSCOPY (EGD);  Surgeon: Malissa Hippo, MD;  [facility];  Service: Endoscopy;  Laterality: N/A;   ESOPHAGOGASTRODUODENOSCOPY (EGD) WITH PROPOFOL N/A 04/28/2022   Procedure: ESOPHAGOGASTRODUODENOSCOPY (EGD) WITH PROPOFOL;  Surgeon: Dolores Frame, MD;  [facility];  Service: Gastroenterology;  Laterality: N/A;  10:30 am, pt can't move up due to transportation   FLEXIBLE SIGMOIDOSCOPY  08/06/2011   Procedure: FLEXIBLE SIGMOIDOSCOPY;  Surgeon: Malissa Hippo, MD;  [facility];  Service: Endoscopy;  Laterality: N/A;   HEMOSTASIS CLIP PLACEMENT  09/01/2022   Procedure: HEMOSTASIS CLIP PLACEMENT;  Surgeon: Dolores Frame, MD;  [facility];  Service: Gastroenterology;;   INGUINAL HERNIA REPAIR Left 1970s   IR ANGIO INTRA EXTRACRAN SEL COM CAROTID INNOMINATE BILAT MOD SED  11/01/2020   IR ANGIO VERTEBRAL SEL VERTEBRAL BILAT MOD SED  11/01/2020   IR RADIOLOGIST EVAL & MGMT  09/23/2020   IR US GUIDE VASC ACCESS RIGHT  11/01/2020   LARYNX SURGERY  1970s   Polyps excised   LEFT HEART CATH AND CORONARY  ANGIOGRAPHY N/A 12/29/2019   Procedure: LEFT HEART CATH AND CORONARY ANGIOGRAPHY;  Surgeon: Corky Crafts, MD;  Location: [facility] INVASIVE CV LAB;  Service: Cardiovascular;  Laterality: N/A;   LEFT HEART CATH AND CORONARY ANGIOGRAPHY N/A 10/12/2022   Procedure: LEFT HEART CATH AND CORONARY ANGIOGRAPHY;  Surgeon: Corky Crafts, MD;  Location: [facility] INVASIVE CV LAB;  Service: Cardiovascular;  Laterality: N/A;   MALONEY DILATION  05/10/2019   Procedure: [name] DILATION;  Surgeon: Malissa Hippo, MD;  [facility];  Service: Endoscopy;;   MELANOMA EXCISION Left ~ 2016   forearm   POLYPECTOMY  05/10/2019   Procedure: POLYPECTOMY;  Surgeon: Malissa Hippo, MD;  [facility];  Service: Endoscopy;;  colon   POLYPECTOMY  09/01/2022   Procedure: POLYPECTOMY INTESTINAL;  Surgeon: Dolores Frame, MD;  [facility];  Service: Gastroenterology;;   SPLENECTOMY, 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   spontaneous rupture   SPLENECTOMY, TOTAL     SUBMUCOSAL LIFTING INJECTION  09/01/2022   Procedure: SUBMUCOSAL LIFTING INJECTION;  Surgeon: Dolores Frame, MD;  [facility];  Service: Gastroenterology;;   SUBMUCOSAL TATTOO INJECTION  09/01/2022   Procedure: SUBMUCOSAL TATTOO INJECTION;  Surgeon: Dolores Frame, MD;  [facility];  Service: Gastroenterology;;   TEMPORARY PACEMAKER N/A 12/29/2019   Procedure: TEMPORARY PACEMAKER;  Surgeon: Corky Crafts, MD;  Location: [facility] INVASIVE CV LAB;  Service: Cardiovascular;  Laterality: N/A;   TRANSURETHRAL RESECTION OF BLADDER TUMOR N/A 03/15/2023   Procedure: TRANSURETHRAL RESECTION OF BLADDER TUMOR (TURBT)with Gemcitibane;  Surgeon: Malen Gauze, MD;  Location: [facility];  Service: Urology;  Laterality: N/A;     Allergies  Allergen Reactions   Gabapentin Nausea And Vomiting    Other Reaction(s): Not available   Prednisone Palpitations    Other Reaction(s): Not available    Carvedilol     Stopped due to bradycardia   Clonidine Derivatives     Stopped due to bradycardia    Diltiazem     Stopped due to bradycardia    Tape Other (See Comments)    Causes blisters to form   Latex Other (See Comments) and Rash    Blisters  Other Reaction(s): Not available      Family History  Problem Relation Age of Onset   Heart failure Mother    Cancer Father    Heart failure Father    Cancer Sister    Dementia Sister    Heart disease Other    Arthritis Other    Cancer Other    Diabetes Other    Kidney disease Other    Cancer Sister    Heart failure Brother      Social History [name] reports that she has been smoking cigarettes. She has a 20 pack-year smoking history. She has been exposed to tobacco smoke. She has never used smokeless tobacco. [name] reports that she does not currently use alcohol.   Review of Systems CONSTITUTIONAL: No weight loss, fever, chills, weakness or fatigue.  HEENT: Eyes: No visual loss, blurred vision, double vision or yellow sclerae.No hearing loss, sneezing, congestion, runny nose or sore throat.  SKIN: No rash or itching.  CARDIOVASCULAR: per hpi RESPIRATORY: No shortness of breath, cough or sputum.  GASTROINTESTINAL: No anorexia, nausea, vomiting or diarrhea. No abdominal pain or blood.  GENITOURINARY: No burning on urination, no polyuria NEUROLOGICAL: No headache, dizziness, syncope,  paralysis, ataxia, numbness or tingling in the extremities. No change in bowel or bladder control.  MUSCULOSKELETAL: No muscle, back pain, joint pain or stiffness.  LYMPHATICS: No enlarged nodes. No history of splenectomy.  PSYCHIATRIC: No history of depression or anxiety.  ENDOCRINOLOGIC: No reports of sweating, cold or heat intolerance. No polyuria or polydipsia.  Marland Kitchen   Physical Examination Today's Vitals   04/09/23 1021  BP: (!) 144/80  Pulse: 76  SpO2: 97%  Weight: 177 lb (80.3 kg)  Height: 5\' 3"  (1.6 m)   Body mass  index is 31.35 kg/m.  Gen: resting comfortably, no acute distress HEENT: no scleral icterus, pupils equal round and reactive, no palptable cervical adenopathy,  CV: RRR, no m/rg, no jvd Resp: Clear to auscultation bilaterally GI: abdomen is soft, non-tender, non-distended, normal bowel sounds, no hepatosplenomegaly MSK: extremities are warm, 1+ RLE edema Skin: warm, no rash Neuro:  no focal deficits Psych: appropriate affect   Diagnostic Studies  01/2016 cath The left ventricular systolic function is normal. LV end diastolic pressure is normal. The left ventricular ejection fraction is 55-65% by visual estimate. Prox RCA lesion, 90 %stenosed. 1st Diag lesion, 60 %stenosed. Prox LAD to Mid LAD lesion, 20 %stenosed. 2nd Diag lesion, 40 %stenosed. Ost Ramus to Ramus lesion, 50 %stenosed. 1st Mrg lesion, 99 %stenosed. A STENT SYNERGY DES 2.75X20 drug eluting stent was successfully placed. Post intervention, there is a 0% residual stenosis. Ost RCA to Prox RCA lesion, 90 %stenosed. Post intervention, there is a 90% residual stenosis with dissection from the proximal to mid RCA.   1. Severe 2 vessel obstructive CAD 2. Normal LV function and LV EDP 3. Successful stenting of the first OM with DES. This appears to be the culprit vessel. 4. Unsuccessful PCI of the RCA due to wire induced dissection of the vessel.    Plan: DAPT for one year. Aggressive BP control. Will continue IV Ntg overnight. Despite RCA dissection patient is pain free, hemodynamically stable and has a normal Ecg. If her symptoms remain stable I would treat medically and allow the RCA to heal. If she continues to have angina attempt at PCI of the RCA could be considered once the vessel has had time to heal - 6-8 weeks.      01/2016 echo Study Conclusions   - Left ventricle: The cavity size was normal. Systolic function was   vigorous. The estimated ejection fraction was in the range of 65%   to 70%. Wall motion  was normal; there were no regional wall   motion abnormalities. There was an increased relative   contribution of atrial contraction to ventricular filling.   Doppler parameters are consistent with abnormal left ventricular   relaxation (grade 1 diastolic dysfunction). Doppler parameters   are consistent with high ventricular filling pressure. - Tricuspid valve: There was trivial regurgitation. - Pulmonary arteries: PA peak pressure: 32 mm Hg (S). - Pericardium, extracardiac: A trivial, free-flowing pericardial   effusion was identified along the right ventricular free wall.   The fluid had no internal echoes.     12/2019 cath Previously placed 1st Mrg drug eluting stent is widely patent. Ost RCA to Prox RCA lesion is 90% stenosed. After orbital atherectomy drug-eluting stent was successfully placed using a STENT RESOLUTE ONYX 3.0X26, and postdilated to 3.75 mm. Stent was optimized with IVUS. Post intervention, there is a 0% residual stenosis. The left ventricular systolic function is normal. LV end diastolic pressure is normal. The left ventricular ejection fraction is 55-65%  by visual estimate. There is no aortic valve stenosis. RCA was prone to vasospasm during the PCI.   Continue dual antiplatelet therapy for at least 6 months.  Consider clopidogrel monotherapy long-term given some diffuse disease in her LAD system.  Continue with attempts at aggressive secondary prevention.   Plan for same day discharge.     08/2020 nuclear stress Lexiscan stress is electrically negative for ischemia Myoview scan shows mild thinning of anterior wall (mid/distal) with decreased tracer activity consistent with mild anterior ischemia; Cannot exclude however that changes are due to shifting breast attenuation. LVEF calculated at 80% This is a low risk study.         Assessment and Plan   1. CAD with stable angina - recent cath as reported above, no significant disease -no recent symptoms,  continue current meds     2.HTN - LE edema on higher norvasc dosing.  - bp mildly elevated today but home numbers and bp's at other provider visits has been at goal - continue current meds   4. Chronic diastolic HF - doing well without symptoms, continue diuretic.    5. Hyperlipidemia - lipids at goal since change to crestor, continue current meds  F/u 6 months     Antoine Poche, M.D.

## 2023-04-12 ENCOUNTER — Telehealth: Payer: Self-pay

## 2023-04-12 ENCOUNTER — Other Ambulatory Visit: Payer: 59 | Admitting: Urology

## 2023-04-12 NOTE — Telephone Encounter (Signed)
Pt called to confirm Surgery time and to ask if there's any pre op work that she needs to do, we let Pt know [facility] will call to give the accurate time and all paperwork and prep will be done at the hospital

## 2023-04-13 ENCOUNTER — Encounter (HOSPITAL_COMMUNITY): Payer: Self-pay | Admitting: Psychiatry

## 2023-04-13 ENCOUNTER — Telehealth (HOSPITAL_COMMUNITY): Payer: 59 | Admitting: Psychiatry

## 2023-04-13 DIAGNOSIS — F5104 Psychophysiologic insomnia: Secondary | ICD-10-CM

## 2023-04-13 DIAGNOSIS — F411 Generalized anxiety disorder: Secondary | ICD-10-CM | POA: Diagnosis not present

## 2023-04-13 DIAGNOSIS — G8929 Other chronic pain: Secondary | ICD-10-CM | POA: Diagnosis not present

## 2023-04-13 DIAGNOSIS — F331 Major depressive disorder, recurrent, moderate: Secondary | ICD-10-CM

## 2023-04-13 DIAGNOSIS — Z79899 Other long term (current) drug therapy: Secondary | ICD-10-CM

## 2023-04-13 DIAGNOSIS — F410 Panic disorder [episodic paroxysmal anxiety] without agoraphobia: Secondary | ICD-10-CM

## 2023-04-13 MED ORDER — ESCITALOPRAM OXALATE 5 MG PO TABS: 5.0000 mg | ORAL_TABLET | Freq: Every day | ORAL | 2 refills | Status: DC

## 2023-04-13 NOTE — Patient Instructions (Addendum)
We started Lexapro (escitalopram) 5 mg nightly today.  This should not make you excessively sedated but should begin to calm down the racing thoughts you have when trying to sleep.  If you run into any problems when starting this medication please let me know.  Keep up the good work with quitting smoking.
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
## 2023-04-14 ENCOUNTER — Telehealth: Payer: Self-pay

## 2023-04-14 NOTE — Telephone Encounter (Signed)
Patient needing to discuss why she is having the upcoming surgery.  Also asking if all the cancer was removed in past surgery.  Please advise.  Call:   [phone]

## 2023-04-16 NOTE — Telephone Encounter (Signed)
After addressing patient questions regarding her upcoming surgery with Dr. Ronne Binning I informed patient of MD's response.  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  Patient also requested more Gemtesa samples, samples were provided at front desk.  Patient will pick up on Monday.  Verbal from Dr. Ronne Binning that he will send in a rx for pain.

## 2023-04-16 NOTE — Telephone Encounter (Signed)
Patient is noticing gross hematuria and is having some pain, she is asking if MD can send a pain medication to [place].

## 2023-04-19 ENCOUNTER — Other Ambulatory Visit: Payer: Self-pay | Admitting: Urology

## 2023-04-19 MED ORDER — OXYCODONE-ACETAMINOPHEN 5-325 MG PO TABS: 1.0000 | ORAL_TABLET | ORAL | 0 refills | Status: DC | PRN

## 2023-04-20 NOTE — Telephone Encounter (Signed)
Information given to patient and she verbalized understanding.

## 2023-04-22 ENCOUNTER — Telehealth: Payer: Self-pay | Admitting: Physical Medicine & Rehabilitation

## 2023-04-22 ENCOUNTER — Telehealth: Payer: Self-pay

## 2023-04-22 DIAGNOSIS — G894 Chronic pain syndrome: Secondary | ICD-10-CM

## 2023-04-22 NOTE — Telephone Encounter (Signed)
 Pharmacy called stating patient does not want the oxycodone  written by Dr. Sherrilee and patient is requesting to have the hydrocodone  filled written by another provider.  Pharmacist made aware that  Dr. Sherrilee would be made aware and that they need to contact the ordering provider concerning the hydrocodone . Pharmacist voiced understanding.

## 2023-04-22 NOTE — Telephone Encounter (Signed)
 Pharmacy wanted to inform physician patient is receiving Oxycodone  from oncology physician. They will fill our script for hydrocodone  and cancel oxycodone 

## 2023-04-27 ENCOUNTER — Encounter (HOSPITAL_COMMUNITY)
Admission: RE | Admit: 2023-04-27 | Discharge: 2023-04-27 | Disposition: A | Discharge status: Home / Self Care | Payer: 59 | Source: Ambulatory Visit | Attending: Urology | Admitting: Urology

## 2023-04-27 NOTE — Patient Instructions (Addendum)
[name]  04/27/2023     @PREFPERIOPPHARMACY @   Your procedure is scheduled on 04/29/2023.   Report to [name] at 7:30 A.M.   Call this number if you have problems the morning of surgery:  [phone]  If you experience any cold or flu symptoms such as cough, fever, chills, shortness of breath, etc. between now and your scheduled surgery, please notify us at the above number.   Remember:   Do not eat after midnight.   You may drink clear liquids until 0530 .  Clear liquids allowed are:                    Water, Carbonated beverages (diabetics please choose diet or no sugar options), Clear Tea (No creamer, milk, or cream, including half & half and powdered creamer), and Clear Sports drink (No red color; diabetics please choose diet or no sugar options)    Take these medicines the morning of surgery with A SIP OF WATER : Amlodipine Zyrtec Lexapro Norco Isosorbide Levothyroxine Pantoprazole and Vibegron  Transurethral Resection of Bladder Tumor  Transurethral resection of a bladder tumor is the removal (resection) of cancerous tissue (tumor) from the inside wall of the bladder. The bladder is the organ that holds urine. The tumor is removed through the tube that carries urine out of the body (urethra). In a transurethral resection, a thin telescope with a light, a tiny camera, and an electric cutting edge (resectoscope) is passed through the urethra. In men, the opening of the urethra is at the end of the penis. In women, it is just above the opening of the vagina. Tell a health care provider about: Any allergies you have. All medicines you are taking, including vitamins, herbs, eye drops, creams, and over-the-counter medicines. Any problems you or family members have had with anesthetic medicines. Any bleeding problems you have. Any surgeries you have had. Any medical conditions you have, including recent urinary tract infections. Whether you are pregnant or may be  pregnant. What are the risks? Generally, this is a safe procedure. However, problems may occur, including: Infection. Bleeding. Allergic reactions to medicines. Damage to nearby structures or organs. Difficulty urinating from blockage of the urethra or not being able to urinate (urinary retention). Deep vein thrombosis. This is a blood clot that can develop in your leg. Recurring cancer. What happens before the procedure? When to stop eating and drinking Follow instructions from your health care provider about what you may eat and drink before your procedure. These may include: 8 hours before your procedure Stop eating most foods. Do not eat meat, fried foods, or fatty foods. Eat only light foods, such as toast or crackers. All liquids are okay except energy drinks and alcohol. 6 hours before your procedure Stop eating. Drink only clear liquids, such as water, clear fruit juice, black coffee, plain tea, and sports drinks. Do not drink energy drinks or alcohol. 2 hours before your procedure Stop drinking all liquids. You may be allowed to take medicines with small sips of water. Medicines Ask your health care provider about: Changing or stopping your regular medicines. This is especially important if you are taking diabetes medicines or blood thinners. Taking medicines such as aspirin and ibuprofen. These medicines can thin your blood. Do not take these medicines unless your health care provider tells you to take them. Taking over-the-counter medicines, vitamins, herbs, and supplements. General instructions If you will be going home right after the procedure, plan to  have a responsible adult: Take you home from the hospital or clinic. You will not be allowed to drive. Care for you for the time you are told. Ask your health care provider what steps will be taken to help prevent infection. These steps may include: Washing skin with a germ-killing soap. Taking antibiotic medicine. Do  not use any products that contain nicotine or tobacco for at least 4 weeks before the procedure. These products include cigarettes, chewing tobacco, and vaping devices, such as e-cigarettes. If you need help quitting, ask your health care provider. What happens during the procedure? An IV will be inserted into one of your veins. You will be given one or more of the following: A medicine to help you relax (sedative). A medicine that is injected into your spine to numb the area below and slightly above the injection site (spinal anesthetic). A medicine that is injected into an area of your body to numb everything below the injection site (regional anesthetic). A medicine to make you fall asleep (general anesthetic). Your legs will be placed in foot rests (stirrups) to open your legs and bend your knees. The resectoscope will be passed through your urethra and into your bladder. The part of your bladder with the tumor will be resected by the cutting edge of the resectoscope. Fluid will be passed to rinse out the cut tissues (irrigation). The resectoscope will then be taken out. A small, thin tube (catheter) will be passed through your urethra and into your bladder. The catheter will drain urine into a bag outside of your body. The procedure may vary among health care providers and hospitals. What happens after the procedure? Your blood pressure, heart rate, breathing rate, and blood oxygen level will be monitored until you leave the hospital or clinic. You may continue to receive fluids and medicines through an IV. You will be given pain medicine to relieve pain. You will have a catheter to drain your urine. The amount of urine will be measured. If you have blood in your urine, your bladder may be rinsed out by passing fluid through your catheter. You will be encouraged to walk as soon as you can. You may have to wear compression stockings. These stockings help to prevent blood clots and reduce  swelling in your legs. If you were given a sedative during the procedure, it can affect you for several hours. Do not drive or operate machinery until your health care provider says that it is safe. Summary Transurethral resection of a bladder tumor is the removal (resection) of a cancerous growth (tumor) on the inside wall of the bladder. To do this procedure, your health care provider uses a thin telescope with a light, a tiny camera, and an electric cutting edge (resectoscope) that is guided to your bladder through your urethra. The part of your bladder that is affected by the tumor will be resected by the cutting edge of the resectoscope. A catheter will be passed through your urethra and into your bladder. The catheter will drain urine into a bag outside of your body. If you will be going home right after the procedure, plan to have a responsible adult take you home from the hospital or clinic. You will not be allowed to drive. This information is not intended to replace advice given to you by your health care provider. Make sure you discuss any questions you have with your health care provider. Document Revised: 03/07/2021 Document Reviewed: 03/07/2021 Elsevier Patient Education  2024 [employer].  Do not wear jewelry, make-up or nail polish, including gel polish,  artificial nails, or any other type of covering on natural nails (fingers and  toes).  Do not wear lotions, powders, or perfumes, or deodorant.  Do not shave 48 hours prior to surgery.  Men may shave face and neck.  Do not bring valuables to the hospital.  [facility] is not responsible for any belongings or valuables.  General Anesthesia, Adult General anesthesia is the use of medicine to make you fall asleep (unconscious) for a medical procedure. General anesthesia must be used for certain procedures. It is often recommended for surgery or procedures that: Last a long time. Require you to be still or in an unusual  position. Are major and can cause blood loss. Affect your breathing. The medicines used for general anesthesia are called general anesthetics. During general anesthesia, these medicines are given along with medicines that: Prevent pain. Control your blood pressure. Relax your muscles. Prevent nausea and vomiting after the procedure. Tell a health care provider about: Any allergies you have. All medicines you are taking, including vitamins, herbs, eye drops, creams, and over-the-counter medicines. Your history of any: Medical conditions you have, including: High blood pressure. Bleeding problems. Diabetes. Heart or lung conditions, such as: Heart failure. Sleep apnea. Asthma. Chronic obstructive pulmonary disease (COPD). Current or recent illnesses, such as: Upper respiratory, chest, or ear infections. Cough or fever. Tobacco or drug use, including marijuana or alcohol use. Depression or anxiety. Surgeries and types of anesthetics you have had. Problems you or family members have had with anesthetic medicines. Whether you are pregnant or may be pregnant. Whether you have any chipped or loose teeth, dentures, caps, bridgework, or issues with your mouth, swallowing, or choking. What are the risks? Your health care provider will talk with you about risks. These may include: Allergic reaction to the medicines. Lung and heart problems. Inhaling food or liquid from the stomach into the lungs (aspiration). Nerve injury. Injury to the lips, mouth, teeth, or gums. Stroke. Waking up during your procedure and being unable to move. This is rare. These problems are more likely to develop if you are having a major surgery or if you have an advanced or serious medical condition. You can prevent some of these complications by answering all of your health care provider's questions thoroughly and by following all instructions before your procedure. General anesthesia can cause side effects,  including: Nausea or vomiting. A sore throat or hoarseness from the breathing tube. Wheezing or coughing. Shaking chills or feeling cold. Body aches. Sleepiness. Confusion, agitation (delirium), or anxiety. What happens before the procedure? When to stop eating and drinking Follow instructions from your health care provider about what you may eat and drink before your procedure. If you do not follow your health care provider's instructions, your procedure may be delayed or canceled. Medicines Ask your health care provider about: Changing or stopping your regular medicines. These include any diabetes medicines or blood thinners you take. Taking medicines such as aspirin and ibuprofen. These medicines can thin your blood. Do not take them unless your health care provider tells you to. Taking over-the-counter medicines, vitamins, herbs, and supplements. General instructions Do not use any products that contain nicotine or tobacco for at least 4 weeks before the procedure. These products include cigarettes, chewing tobacco, and vaping devices, such as e-cigarettes. If you need help quitting, ask your health care provider. If you brush your teeth on the morning of the procedure, make sure  to spit out all of the water and toothpaste. If told by your health care provider, bring your sleep apnea device with you to surgery (if applicable). If you will be going home right after the procedure, plan to have a responsible adult: Take you home from the hospital or clinic. You will not be allowed to drive. Care for you for the time you are told. What happens during the procedure?  An IV will be inserted into one of your veins. You will be given one or more of the following through a face mask or IV: A sedative. This helps you relax. Anesthesia. This will: Numb certain areas of your body. Make you fall asleep for surgery. After you are unconscious, a breathing tube may be inserted down your throat to  help you breathe. This will be removed before you wake up. An anesthesia provider, such as an anesthesiologist, will stay with you throughout your procedure. The anesthesia provider will: Keep you comfortable and safe by continuing to give you medicines and adjusting the amount of medicine that you get. Monitor your blood pressure, heart rate, and oxygen levels to make sure that the anesthetics do not cause any problems. The procedure may vary among health care providers and hospitals. What happens after the procedure? Your blood pressure, temperature, heart rate, breathing rate, and blood oxygen level will be monitored until you leave the hospital or clinic. You will wake up in a recovery area. You may wake up slowly. You may be given medicine to help you with pain, nausea, or any other side effects from the anesthesia. Summary General anesthesia is the use of medicine to make you fall asleep (unconscious) for a medical procedure. Follow your health care provider's instructions about when to stop eating, drinking, or taking certain medicines before your procedure. Plan to have a responsible adult take you home from the hospital or clinic. This information is not intended to replace advice given to you by your health care provider. Make sure you discuss any questions you have with your health care provider. Document Revised: 05/29/2021 Document Reviewed: 05/29/2021 Elsevier Patient Education  2024 [employer]  How to Use Chlorhexidine at Home in the Shower Chlorhexidine gluconate (CHG) is a germ-killing (antiseptic) wash that's used to clean the skin. It can get rid of the germs that normally live on the skin and can keep them away for about 24 hours. If you're having surgery, you may be told to shower with CHG at home the night before surgery. This can help lower your risk for infection. To use CHG wash in the shower, follow the steps below. Supplies needed: CHG body wash. Clean  washcloth. Clean towel. How to use CHG in the shower Follow these steps unless you're told to use CHG in a different way: Start the shower. Use your normal soap and shampoo to wash your face and hair. Turn off the shower or move out of the shower stream. Pour CHG onto a clean washcloth. Do not use any type of brush or rough sponge. Start at your neck, washing your body down to your toes. Make sure you: Wash the part of your body where the surgery will be done for at least 1 minute. Do not scrub. Do not use CHG on your head or face unless your health care provider tells you to. If it gets into your ears or eyes, rinse them well with water. Do not wash your genitals with CHG. Wash your back and under your arms. Make  sure to wash skin folds. Let the CHG sit on your skin for 1-2 minutes or as long as told. Rinse your entire body in the shower, including all body creases and folds. Turn off the shower. Dry off with a clean towel. Do not put anything on your skin afterward, such as powder, lotion, or perfume. Put on clean clothes or pajamas. If it's the night before surgery, sleep in clean sheets. General tips Use CHG only as told, and follow the instructions on the label. Use the full amount of CHG as told. This is often one bottle. Do not smoke and stay away from flames after using CHG. Your skin may feel sticky after using CHG. This is normal. The sticky feeling will go away as the CHG dries. Do not use CHG: If you have a chlorhexidine allergy or have reacted to chlorhexidine in the past. On open wounds or areas of skin that have broken skin, cuts, or scrapes. On babies younger than 21 months of age. Contact a health care provider if: You have questions about using CHG. Your skin gets irritated or itchy. You have a rash after using CHG. You swallow any CHG. Call your local poison control center [phone] in the [place] Your eyes itch badly, or they become very red or swollen. Your  hearing changes. You have trouble seeing. If you can't reach your provider, go to an urgent care or emergency room. Do not drive yourself. Get help right away if: You have swelling or tingling in your mouth or throat. You make high-pitched whistling sounds when you breathe, most often when you breathe out (wheeze). You have trouble breathing. These symptoms may be an emergency. Call 911 right away. Do not wait to see if the symptoms will go away. Do not drive yourself to the hospital. This information is not intended to replace advice given to you by your health care provider. Make sure you discuss any questions you have with your health care provider. Document Revised: 09/15/2022 Document Reviewed: 09/11/2021 Elsevier Patient Education  2024 [employer]  Contacts, dentures or bridgework may not be worn into surgery.  Leave your suitcase in the car.  After surgery it may be brought to your room.  For patients admitted to the hospital, discharge time will be determined by your treatment team.  Patients discharged the day of surgery will not be allowed to drive home.   Name and phone number of your driver:   Family Special instructions:  N/A  Please read over the following fact sheets that you were given. Care and Recovery After Surgery

## 2023-04-28 MED ORDER — GEMCITABINE CHEMO FOR BLADDER INSTILLATION 2000 MG
2000.0000 mg | Freq: Once | INTRAVENOUS | Status: DC
  Filled 2023-04-28: qty 52.6

## 2023-04-29 ENCOUNTER — Encounter (HOSPITAL_COMMUNITY)
Admission: RE | Disposition: A | Discharge status: Home / Self Care | Payer: Self-pay | Source: Ambulatory Visit | Attending: Urology

## 2023-04-29 ENCOUNTER — Encounter (HOSPITAL_COMMUNITY): Payer: Self-pay | Admitting: Urology

## 2023-04-29 ENCOUNTER — Other Ambulatory Visit: Payer: Self-pay

## 2023-04-29 ENCOUNTER — Ambulatory Visit (HOSPITAL_BASED_OUTPATIENT_CLINIC_OR_DEPARTMENT_OTHER): Payer: 59 | Admitting: Anesthesiology

## 2023-04-29 ENCOUNTER — Ambulatory Visit (HOSPITAL_COMMUNITY)
Admission: RE | Admit: 2023-04-29 | Discharge: 2023-04-29 | Disposition: A | Discharge status: Home / Self Care | Payer: 59 | Source: Ambulatory Visit | Attending: Urology | Admitting: Urology

## 2023-04-29 ENCOUNTER — Ambulatory Visit (HOSPITAL_COMMUNITY): Payer: 59 | Admitting: Anesthesiology

## 2023-04-29 DIAGNOSIS — F418 Other specified anxiety disorders: Secondary | ICD-10-CM | POA: Insufficient documentation

## 2023-04-29 DIAGNOSIS — J449 Chronic obstructive pulmonary disease, unspecified: Secondary | ICD-10-CM | POA: Diagnosis not present

## 2023-04-29 DIAGNOSIS — Z7984 Long term (current) use of oral hypoglycemic drugs: Secondary | ICD-10-CM | POA: Insufficient documentation

## 2023-04-29 DIAGNOSIS — K219 Gastro-esophageal reflux disease without esophagitis: Secondary | ICD-10-CM | POA: Insufficient documentation

## 2023-04-29 DIAGNOSIS — E039 Hypothyroidism, unspecified: Secondary | ICD-10-CM | POA: Diagnosis not present

## 2023-04-29 DIAGNOSIS — E119 Type 2 diabetes mellitus without complications: Secondary | ICD-10-CM | POA: Insufficient documentation

## 2023-04-29 DIAGNOSIS — I110 Hypertensive heart disease with heart failure: Secondary | ICD-10-CM | POA: Insufficient documentation

## 2023-04-29 DIAGNOSIS — D494 Neoplasm of unspecified behavior of bladder: Secondary | ICD-10-CM

## 2023-04-29 DIAGNOSIS — Z79899 Other long term (current) drug therapy: Secondary | ICD-10-CM | POA: Diagnosis not present

## 2023-04-29 DIAGNOSIS — C679 Malignant neoplasm of bladder, unspecified: Secondary | ICD-10-CM | POA: Diagnosis present

## 2023-04-29 DIAGNOSIS — K759 Inflammatory liver disease, unspecified: Secondary | ICD-10-CM | POA: Diagnosis not present

## 2023-04-29 DIAGNOSIS — I2510 Atherosclerotic heart disease of native coronary artery without angina pectoris: Secondary | ICD-10-CM | POA: Diagnosis not present

## 2023-04-29 DIAGNOSIS — I252 Old myocardial infarction: Secondary | ICD-10-CM | POA: Diagnosis not present

## 2023-04-29 DIAGNOSIS — Z8249 Family history of ischemic heart disease and other diseases of the circulatory system: Secondary | ICD-10-CM | POA: Diagnosis not present

## 2023-04-29 DIAGNOSIS — K449 Diaphragmatic hernia without obstruction or gangrene: Secondary | ICD-10-CM | POA: Insufficient documentation

## 2023-04-29 DIAGNOSIS — C672 Malignant neoplasm of lateral wall of bladder: Secondary | ICD-10-CM | POA: Diagnosis not present

## 2023-04-29 DIAGNOSIS — Z833 Family history of diabetes mellitus: Secondary | ICD-10-CM | POA: Insufficient documentation

## 2023-04-29 DIAGNOSIS — F17210 Nicotine dependence, cigarettes, uncomplicated: Secondary | ICD-10-CM | POA: Diagnosis not present

## 2023-04-29 DIAGNOSIS — D090 Carcinoma in situ of bladder: Secondary | ICD-10-CM | POA: Insufficient documentation

## 2023-04-29 DIAGNOSIS — I509 Heart failure, unspecified: Secondary | ICD-10-CM | POA: Insufficient documentation

## 2023-04-29 DIAGNOSIS — M1990 Unspecified osteoarthritis, unspecified site: Secondary | ICD-10-CM | POA: Diagnosis not present

## 2023-04-29 DIAGNOSIS — I69351 Hemiplegia and hemiparesis following cerebral infarction affecting right dominant side: Secondary | ICD-10-CM | POA: Insufficient documentation

## 2023-04-29 DIAGNOSIS — I5032 Chronic diastolic (congestive) heart failure: Secondary | ICD-10-CM | POA: Diagnosis not present

## 2023-04-29 HISTORY — PX: TRANSURETHRAL RESECTION OF BLADDER TUMOR: SHX2575

## 2023-04-29 HISTORY — PX: CYSTOSCOPY WITH BIOPSY: SHX5122

## 2023-04-29 HISTORY — PX: BLADDER INSTILLATION: SHX6893

## 2023-04-29 SURGERY — CYSTOSCOPY, WITH BIOPSY
Anesthesia: General

## 2023-04-29 MED ORDER — IPRATROPIUM-ALBUTEROL 0.5-2.5 (3) MG/3ML IN SOLN
RESPIRATORY_TRACT | Status: AC
  Filled 2023-04-29: qty 3

## 2023-04-29 MED ORDER — CHLORHEXIDINE GLUCONATE 0.12 % MT SOLN
15.0000 mL | Freq: Once | OROMUCOSAL | Status: AC
  Administered 2023-04-29: 15 mL via OROMUCOSAL

## 2023-04-29 MED ORDER — PROPOFOL 10 MG/ML IV BOLUS
INTRAVENOUS | Status: DC | PRN
  Administered 2023-04-29: 50 mg via INTRAVENOUS
  Administered 2023-04-29: 120 mg via INTRAVENOUS

## 2023-04-29 MED ORDER — ORAL CARE MOUTH RINSE: 15.0000 mL | Freq: Once | OROMUCOSAL | Status: AC

## 2023-04-29 MED ORDER — OXYCODONE HCL 5 MG PO TABS
5.0000 mg | ORAL_TABLET | Freq: Once | ORAL | Status: DC
  Filled 2023-04-29: qty 1

## 2023-04-29 MED ORDER — FENTANYL CITRATE PF 50 MCG/ML IJ SOSY
25.0000 ug | PREFILLED_SYRINGE | INTRAMUSCULAR | Status: DC | PRN
  Administered 2023-04-29: 50 ug via INTRAVENOUS
  Filled 2023-04-29: qty 1

## 2023-04-29 MED ORDER — FENTANYL CITRATE (PF) 100 MCG/2ML IJ SOLN
INTRAMUSCULAR | Status: DC | PRN
  Administered 2023-04-29 (×2): 50 ug via INTRAVENOUS

## 2023-04-29 MED ORDER — SUGAMMADEX SODIUM 200 MG/2ML IV SOLN
INTRAVENOUS | Status: DC | PRN
  Administered 2023-04-29: 320 mg via INTRAVENOUS

## 2023-04-29 MED ORDER — GEMCITABINE CHEMO FOR BLADDER INSTILLATION 2000 MG
INTRAVENOUS | Status: DC | PRN
  Administered 2023-04-29: 2000 mg via INTRAVESICAL

## 2023-04-29 MED ORDER — METOCLOPRAMIDE HCL 5 MG/ML IJ SOLN: 10.0000 mg | Freq: Once | INTRAMUSCULAR | Status: DC | PRN

## 2023-04-29 MED ORDER — SODIUM CHLORIDE 0.9 % IR SOLN
Status: DC | PRN
  Administered 2023-04-29: 3000 mL

## 2023-04-29 MED ORDER — OXYCODONE-ACETAMINOPHEN 5-325 MG PO TABS
1.0000 | ORAL_TABLET | ORAL | 0 refills | Status: DC | PRN
Start: 2023-04-29 — End: 2023-05-12

## 2023-04-29 MED ORDER — FENTANYL CITRATE (PF) 100 MCG/2ML IJ SOLN
INTRAMUSCULAR | Status: AC
  Filled 2023-04-29: qty 2

## 2023-04-29 MED ORDER — CEFAZOLIN SODIUM-DEXTROSE 2-4 GM/100ML-% IV SOLN
INTRAVENOUS | Status: AC
Start: 2023-04-29 — End: 2023-04-29
  Filled 2023-04-29: qty 100

## 2023-04-29 MED ORDER — MIDAZOLAM HCL 2 MG/2ML IJ SOLN
INTRAMUSCULAR | Status: AC
  Filled 2023-04-29: qty 2

## 2023-04-29 MED ORDER — STERILE WATER FOR IRRIGATION IR SOLN
Status: DC | PRN
  Administered 2023-04-29: 1000 mL

## 2023-04-29 MED ORDER — MIDAZOLAM HCL 2 MG/2ML IJ SOLN
INTRAMUSCULAR | Status: DC | PRN
  Administered 2023-04-29: 2 mg via INTRAVENOUS

## 2023-04-29 MED ORDER — MEPERIDINE HCL 50 MG/ML IJ SOLN
6.2500 mg | INTRAMUSCULAR | Status: DC | PRN
Start: 2023-04-29 — End: 2023-04-29

## 2023-04-29 MED ORDER — DEXAMETHASONE SODIUM PHOSPHATE 10 MG/ML IJ SOLN
INTRAMUSCULAR | Status: DC | PRN
  Administered 2023-04-29: 5 mg via INTRAVENOUS

## 2023-04-29 MED ORDER — LACTATED RINGERS IV SOLN: INTRAVENOUS | Status: DC

## 2023-04-29 MED ORDER — ORAL CARE MOUTH RINSE: 15.0000 mL | Freq: Once | OROMUCOSAL | Status: DC

## 2023-04-29 MED ORDER — ROCURONIUM BROMIDE 10 MG/ML (PF) SYRINGE
PREFILLED_SYRINGE | INTRAVENOUS | Status: DC | PRN
  Administered 2023-04-29: 50 mg via INTRAVENOUS

## 2023-04-29 MED ORDER — CEFAZOLIN SODIUM-DEXTROSE 2-4 GM/100ML-% IV SOLN
2.0000 g | INTRAVENOUS | Status: AC
  Administered 2023-04-29: 2 g via INTRAVENOUS

## 2023-04-29 MED ORDER — LIDOCAINE HCL (PF) 2 % IJ SOLN
INTRAMUSCULAR | Status: DC | PRN
  Administered 2023-04-29: 80 mg via INTRADERMAL

## 2023-04-29 MED ORDER — ONDANSETRON HCL 4 MG/2ML IJ SOLN
INTRAMUSCULAR | Status: DC | PRN
  Administered 2023-04-29: 4 mg via INTRAVENOUS

## 2023-04-29 MED ORDER — CHLORHEXIDINE GLUCONATE 0.12 % MT SOLN: 15.0000 mL | Freq: Once | OROMUCOSAL | Status: DC

## 2023-04-29 MED ORDER — IPRATROPIUM-ALBUTEROL 0.5-2.5 (3) MG/3ML IN SOLN
3.0000 mL | Freq: Once | RESPIRATORY_TRACT | Status: AC
  Administered 2023-04-29: 3 mL via RESPIRATORY_TRACT

## 2023-04-29 SURGICAL SUPPLY — 31 items
ADAPTER CATH SYR TO TUBING 38M (ADAPTER) IMPLANT
BAG DRAIN URO TABLE W/ADPT NS (BAG) ×1 IMPLANT
BAG HAMPER (MISCELLANEOUS) ×1 IMPLANT
BAG URINE DRAIN 2000ML AR STRL (UROLOGICAL SUPPLIES) ×1 IMPLANT
CATH FOLEY 2W 5CC 18FR LF (CATHETERS) IMPLANT
CLOTH BEACON ORANGE TIMEOUT ST (SAFETY) ×1 IMPLANT
ELECT LOOP 22F BIPOLAR SML (ELECTROSURGICAL) ×1 IMPLANT
ELECT REM PT RETURN 9FT ADLT (ELECTROSURGICAL) ×1 IMPLANT
ELECTRODE LOOP 22F BIPOLAR SML (ELECTROSURGICAL) ×1 IMPLANT
ELECTRODE REM PT RTRN 9FT ADLT (ELECTROSURGICAL) ×1 IMPLANT
GLOVE BIO SURGEON STRL SZ8 (GLOVE) ×1 IMPLANT
GLOVE BIOGEL PI IND STRL 7.0 (GLOVE) ×2 IMPLANT
GOWN STRL REUS W/TWL LRG LVL3 (GOWN DISPOSABLE) ×1 IMPLANT
GOWN STRL REUS W/TWL XL LVL3 (GOWN DISPOSABLE) ×1 IMPLANT
IV NS IRRIG 3000ML ARTHROMATIC (IV SOLUTION) ×2 IMPLANT
KIT CHEMO SPILL (MISCELLANEOUS) ×1 IMPLANT
KIT TURNOVER CYSTO (KITS) ×1 IMPLANT
MANIFOLD NEPTUNE II (INSTRUMENTS) ×1 IMPLANT
NDL HYPO 18GX1.5 BLUNT FILL (NEEDLE) ×1 IMPLANT
NEEDLE HYPO 18GX1.5 BLUNT FILL (NEEDLE) ×1 IMPLANT
PACK CYSTO (CUSTOM PROCEDURE TRAY) ×1 IMPLANT
PAD ARMBOARD 7.5X6 YLW CONV (MISCELLANEOUS) ×1 IMPLANT
PAD TELFA 3X4 1S STER (GAUZE/BANDAGES/DRESSINGS) ×1 IMPLANT
PLUG CATH AND CAP STRL 200 (CATHETERS) IMPLANT
POSITIONER HEAD 8X9X4 ADT (SOFTGOODS) ×1 IMPLANT
SYR 30ML LL (SYRINGE) ×1 IMPLANT
SYR TOOMEY IRRIG 70ML (MISCELLANEOUS) ×1 IMPLANT
SYRINGE TOOMEY IRRIG 70ML (MISCELLANEOUS) ×1 IMPLANT
TOWEL OR 17X26 4PK STRL BLUE (TOWEL DISPOSABLE) ×1 IMPLANT
WATER STERILE IRR 3000ML UROMA (IV SOLUTION) ×1 IMPLANT
WATER STERILE IRR 500ML POUR (IV SOLUTION) ×1 IMPLANT

## 2023-04-29 NOTE — H&P (Signed)
HPI: [name] is a 73yo here for repeat bladder tumor resection. Pathology T1G3 without muscle involvement.     PMH:     Past Medical History:  Diagnosis Date   Anxiety     Arthritis      "hands, arms, back, neck, knees, fingers" (01/21/2016)   Bradycardia     Breast cancer, left breast (HCC) 1970s; 2015   CAD (coronary artery disease)      a. s/p NSTEMI in 2017 with DES to OM and unsuccessful PCI to RCA with wire dissection b. DES to RCA in 2021   Cancer of skin of leg      BLE   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HCC)      left   Chronic lower back pain     Chronic pain     COPD (chronic obstructive pulmonary disease) (HCC)     Coronary artery disease     Depression     GERD (gastroesophageal reflux disease)     Heart murmur     Hepatic cirrhosis (HCC) 12/20/2017   Hepatitis     History of blood transfusion      "related to spleen"   History of hiatal hernia     History of kidney stones     Hyperlipidemia     Hypertension     Hypothyroidism     Melanoma of forearm, left (HCC)     Migraine      "a few migraines/year" (01/21/2016)   Obesity     Stroke (HCC) 04/2003    2 carotid artery stents in place; small left parietal and left hemispheric CVA.[name] 01/21/2016   Tobacco use disorder      50 pack years; questionably discontinued in 2010   Wears glasses            Surgical History:      Past Surgical History:  Procedure Laterality Date   ABDOMINAL HYSTERECTOMY   1978   APPENDECTOMY       BIOPSY   04/28/2022    Procedure: BIOPSY;  Surgeon: Marguerita Merles, Reuel Boom, MD;  Location: [facility];  Service: Gastroenterology;;   BREAST BIOPSY Left 1970s; 2015   BREAST LUMPECTOMY Left 1970s   BREAST LUMPECTOMY WITH NEEDLE LOCALIZATION Left 07/07/2013    Procedure: BREAST LUMPECTOMY WITH NEEDLE LOCALIZATION;  Surgeon: Robyne Askew, MD;  Location: [facility];  Service: General;  Laterality: Left;   CARDIAC CATHETERIZATION N/A 01/22/2016    Procedure: Left  Heart Cath and Coronary Angiography;  Surgeon: Peter M Swaziland, MD;  Location: [facility] INVASIVE CV LAB;  Service: Cardiovascular;  Laterality: N/A;   CARDIAC CATHETERIZATION N/A 01/22/2016    Procedure: Coronary Stent Intervention;  Surgeon: Peter M Swaziland, MD;  Location: [facility] INVASIVE CV LAB;  Service: Cardiovascular;  Laterality: N/A;   CAROTID STENT Left 04/2003    small left parietal and left hemispheric CVA/notes 07/29/2010   CHOLECYSTECTOMY OPEN       COLONOSCOPY   2011   COLONOSCOPY N/A 05/10/2019    Procedure: COLONOSCOPY;  Surgeon: Malissa Hippo, MD;  Location: [facility];  Service: Endoscopy;  Laterality: N/A;  1:45   COLONOSCOPY WITH PROPOFOL N/A 09/01/2022    Procedure: COLONOSCOPY WITH PROPOFOL;  Surgeon: Dolores Frame, MD;  Location: [facility];  Service: Gastroenterology;  Laterality: N/A;  1:45PM;ASA 3   CORONARY ATHERECTOMY N/A 12/29/2019    Procedure: CORONARY ATHERECTOMY;  Surgeon: Corky Crafts, MD;  Location: [facility] INVASIVE CV LAB;  Service:  Cardiovascular;  Laterality: N/A;  Prox RCA   CORONARY STENT INTERVENTION N/A 12/29/2019    Procedure: CORONARY STENT INTERVENTION;  Surgeon: Corky Crafts, MD;  Location: [facility] INVASIVE CV LAB;  Service: Cardiovascular;  Laterality: N/A;  Prox RCA   CORONARY ULTRASOUND/IVUS N/A 12/29/2019    Procedure: Intravascular Ultrasound/IVUS;  Surgeon: Corky Crafts, MD;  Location: [facility] INVASIVE CV LAB;  Service: Cardiovascular;  Laterality: N/A;   ESOPHAGEAL DILATION N/A 04/28/2022    Procedure: ESOPHAGEAL DILATION;  Surgeon: Dolores Frame, MD;  [facility]: [facility];  Service: Gastroenterology;  Laterality: N/A;   ESOPHAGOGASTRODUODENOSCOPY   08/06/2011    Procedure: ESOPHAGOGASTRODUODENOSCOPY (EGD);  Surgeon: Malissa Hippo, MD;  [facility]: [facility];  Service: Endoscopy;  Laterality: N/A;   ESOPHAGOGASTRODUODENOSCOPY N/A 05/10/2019    Procedure: ESOPHAGOGASTRODUODENOSCOPY (EGD);  Surgeon: Malissa Hippo, MD;  [facility];  Service: Endoscopy;  Laterality: N/A;   ESOPHAGOGASTRODUODENOSCOPY (EGD) WITH PROPOFOL N/A 04/28/2022    Procedure: ESOPHAGOGASTRODUODENOSCOPY (EGD) WITH PROPOFOL;  Surgeon: Dolores Frame, MD;  [facility]: [facility];  Service: Gastroenterology;  Laterality: N/A;  10:30 am, pt can't move up due to transportation   FLEXIBLE SIGMOIDOSCOPY   08/06/2011    Procedure: FLEXIBLE SIGMOIDOSCOPY;  Surgeon: Malissa Hippo, MD;  [facility]: [facility];  Service: Endoscopy;  Laterality: N/A;   HEMOSTASIS CLIP PLACEMENT   09/01/2022    Procedure: HEMOSTASIS CLIP PLACEMENT;  Surgeon: Dolores Frame, MD;  Location: [facility];  Service: Gastroenterology;;   INGUINAL HERNIA REPAIR Left 1970s   IR ANGIO INTRA EXTRACRAN SEL COM CAROTID INNOMINATE BILAT MOD SED   11/01/2020   IR ANGIO VERTEBRAL SEL VERTEBRAL BILAT MOD SED   11/01/2020   IR RADIOLOGIST EVAL & MGMT   09/23/2020   IR US GUIDE VASC ACCESS RIGHT   11/01/2020   LARYNX SURGERY   1970s    Polyps excised   LEFT HEART CATH AND CORONARY ANGIOGRAPHY N/A 12/29/2019    Procedure: LEFT HEART CATH AND CORONARY ANGIOGRAPHY;  Surgeon: Corky Crafts, MD;  Location: [facility]VAS[facility] CV LAB;  Service: Cardiovascular;  Laterality: N/A;   LEFT HEART CATH AND CORONARY ANGIOGRAPHY N/A 10/12/2022    Procedure: LEFT HEART CATH AND CORONARY ANGIOGRAPHY;  Surgeon: Corky Crafts, MD;  Location: [facility] INVASIVE CV LAB;  Service: Cardiovascular;  Laterality: N/A;   MALONEY DILATION   05/10/2019    Procedure: [name] DILATION;  Surgeon: Malissa Hippo, MD;  Location: [facility];  Service: Endoscopy;;   MELANOMA EXCISION Left ~ 2016    forearm   POLYPECTOMY   05/10/2019    Procedure: POLYPECTOMY;  Surgeon: Malissa Hippo, MD;  Location: [facility];  Service: Endoscopy;;  colon   POLYPECTOMY   09/01/2022    Procedure: POLYPECTOMY INTESTINAL;  Surgeon: Dolores Frame, MD;  [facility];  Service: Gastroenterology;;   SPLENECTOMY, TOTAL   1990s?    spontaneous rupture   SPLENECTOMY, TOTAL       SUBMUCOSAL LIFTING INJECTION   09/01/2022    Procedure: SUBMUCOSAL LIFTING INJECTION;  Surgeon: Dolores Frame, MD;  [facility]: [facility];  Service: Gastroenterology;;   SUBMUCOSAL TATTOO INJECTION   09/01/2022    Procedure: SUBMUCOSAL TATTOO INJECTION;  Surgeon: Dolores Frame, MD;  [facility];  Service: Gastroenterology;;   TEMPORARY PACEMAKER N/A 12/29/2019    Procedure: TEMPORARY PACEMAKER;  Surgeon: Corky Crafts, MD;  Location: [facility] INVASIVE CV LAB;  Service: Cardiovascular;  Laterality: N/A;   TRANSURETHRAL RESECTION OF BLADDER TUMOR N/A 03/15/2023    Procedure: TRANSURETHRAL RESECTION OF BLADDER TUMOR (TURBT)with Gemcitibane;  Surgeon: Malen Gauze, MD;  Location: [facility];  Service: Urology;  Laterality: N/A;          Home Medications:  Allergies as of 03/29/2023         Reactions    Gabapentin Nausea And Vomiting    Other Reaction(s): Not available    Prednisone Palpitations    Other Reaction(s): Not available    Carvedilol      Stopped due to bradycardia    Clonidine Derivatives      Stopped due to bradycardia    Diltiazem      Stopped due to bradycardia    Tape Other (See Comments)    Causes blisters to form    Latex Other (See Comments), Rash    Blisters Other Reaction(s): Not available            Medication List           Accurate as of March 29, 2023  9:19 AM. If you have any questions, ask your nurse or doctor.              albuterol 108 (90 Base) MCG/ACT inhaler Commonly known as: VENTOLIN HFA Inhale 2 puffs into the lungs every 4 (four) hours as needed for wheezing or shortness of breath.    ALLERGY EYE DROPS OP Place 1 drop into both eyes daily as needed (allergies).    amLODipine 5 MG tablet Commonly known as: NORVASC TAKE ONE TABLET BY MOUTH EVERY DAY    aspirin EC 81  MG tablet Take 1 tablet (81 mg total) by mouth daily.    azaTHIOprine 50 MG tablet Commonly known as: IMURAN Take 1 tablet (50 mg total) by mouth daily.    cetirizine 10 MG tablet Commonly known as: ZYRTEC Take 10 mg by mouth daily.    clopidogrel 75 MG tablet Commonly known as: PLAVIX Take 1 tablet (75 mg total) by mouth daily.    clorazepate 3.75 MG tablet Commonly known as: TRANXENE Take 1 tablet (3.75 mg total) by mouth daily as needed for anxiety.    clotrimazole 1 % cream Commonly known as: Clotrimazole Anti-Fungal Apply to genital rash twice per day for 7 days. If rash not resolved after 7 days please follow up with your primary care provider.    famotidine 20 MG tablet Commonly known as: PEPCID Take 1 tablet (20 mg total) by mouth at bedtime.    furosemide 40 MG tablet Commonly known as: LASIX Take 1 tablet (40 mg total) by mouth daily as needed for fluid (leg/hand swelling.).    HYDROcodone-acetaminophen 10-325 MG tablet Commonly known as: NORCO Take 1 tablet by mouth every 6 (six) hours as needed for moderate pain (pain score 4-6).    isosorbide mononitrate 120 MG 24 hr tablet Commonly known as: IMDUR TAKE ONE TABLET BY MOUTH EVERY DAY    Jardiance 10 MG Tabs tablet Generic drug: empagliflozin Take 10 mg by mouth every morning.    levothyroxine 50 MCG tablet Commonly known as: SYNTHROID Take 50 mcg by mouth daily before breakfast.    losartan 25 MG tablet Commonly known as: COZAAR Take 1 tablet (25 mg total) by mouth daily.    naloxone 4 MG/0.1ML Liqd nasal spray kit Commonly known as: NARCAN In case of opiod overdose    nitroGLYCERIN 0.4 MG SL tablet Commonly known as:  NITROSTAT Place 0.4 mg under the tongue every 5 (five) minutes as needed for chest pain.    oxyCODONE-acetaminophen 5-325 MG tablet Commonly known as: Percocet Take 1 tablet by mouth every 4 (four) hours as needed for severe pain (pain score 7-10).    pantoprazole 40 MG  tablet Commonly known as: PROTONIX TAKE 1 TABLET BY MOUTH ONCE A DAY.    potassium chloride SA 20 MEQ tablet Commonly known as: KLOR-CON M Take 1 tablet (20 mEq total) by mouth daily as needed (Take only when you take Lasix).    rosuvastatin 40 MG tablet Commonly known as: CRESTOR Take 1 tablet (40 mg total) by mouth daily.    ursodiol 500 MG tablet Commonly known as: ACTIGALL Take 1 tablet (500 mg total) by mouth 2 (two) times daily.    Vitamin D3 Super Strength 50 MCG (2000 UT) Caps Generic drug: Cholecalciferol Take 2,000 Units by mouth daily.             Allergies:  Allergies       Allergies  Allergen Reactions   Gabapentin Nausea And Vomiting      Other Reaction(s): Not available   Prednisone Palpitations      Other Reaction(s): Not available   Carvedilol        Stopped due to bradycardia   Clonidine Derivatives        Stopped due to bradycardia     Diltiazem        Stopped due to bradycardia     Tape Other (See Comments)      Causes blisters to form   Latex Other (See Comments) and Rash      Blisters   Other Reaction(s): Not available        Family History:      Family History  Problem Relation Age of Onset   Heart failure Mother     Cancer Father     Heart failure Father     Cancer Sister     Dementia Sister     Heart disease Other     Arthritis Other     Cancer Other     Diabetes Other     Kidney disease Other     Cancer Sister     Heart failure Brother            Social History:  reports that she has been smoking cigarettes. She has a 20 pack-year smoking history. She has been exposed to tobacco smoke. She has never used smokeless tobacco. She reports that she does not currently use alcohol. She reports that she does not use drugs.   ROS: All other review of systems were reviewed and are negative except what is noted above in HPI   Physical Exam: BP 134/71   Pulse 99   Constitutional:  Alert and oriented, No acute  distress. HEENT: Leary AT, moist mucus membranes.  Trachea midline, no masses. Cardiovascular: No clubbing, cyanosis, or edema. Respiratory: Normal respiratory effort, no increased work of breathing. GI: Abdomen is soft, nontender, nondistended, no abdominal masses GU: No CVA tenderness.  Lymph: No cervical or inguinal lymphadenopathy. Skin: No rashes, bruises or suspicious lesions. Neurologic: Grossly intact, no focal deficits, moving all 4 extremities. Psychiatric: Normal mood and affect.   Laboratory Data: Recent Labs       Lab Results  Component Value Date    WBC 12.8 (H) 02/24/2023    HGB 13.5 02/24/2023    HCT 41.7 02/24/2023    MCV 90.5  02/24/2023    PLT 312 02/24/2023        Recent Labs       Lab Results  Component Value Date    CREATININE 0.63 02/24/2023        Recent Labs  No results found for: "PSA"     Recent Labs  No results found for: "TESTOSTERONE"     Recent Labs       Lab Results  Component Value Date    HGBA1C 7.1 (H) 10/19/2022        Urinalysis Labs (Brief)           Component Value Date/Time    COLORURINE RED (A) 02/02/2023 1156    APPEARANCEUR Cloudy (A) 02/22/2023 0853    LABSPEC   02/02/2023 1156      TEST NOT REPORTED DUE TO COLOR INTERFERENCE OF URINE PIGMENT    PHURINE   02/02/2023 1156      TEST NOT REPORTED DUE TO COLOR INTERFERENCE OF URINE PIGMENT    GLUCOSEU 3+ (A) 02/22/2023 0853    HGBUR (A) 02/02/2023 1156      TEST NOT REPORTED DUE TO COLOR INTERFERENCE OF URINE PIGMENT    BILIRUBINUR Negative 02/22/2023 0853    KETONESUR (A) 02/02/2023 1156      TEST NOT REPORTED DUE TO COLOR INTERFERENCE OF URINE PIGMENT    PROTEINUR Trace 02/22/2023 0853    PROTEINUR (A) 02/02/2023 1156      TEST NOT REPORTED DUE TO COLOR INTERFERENCE OF URINE PIGMENT    UROBILINOGEN 0.2 11/03/2014 1722    NITRITE Negative 02/22/2023 0853    NITRITE (A) 02/02/2023 1156      TEST NOT REPORTED DUE TO COLOR INTERFERENCE OF URINE PIGMENT     LEUKOCYTESUR Negative 02/22/2023 0853    LEUKOCYTESUR (A) 02/02/2023 1156      TEST NOT REPORTED DUE TO COLOR INTERFERENCE OF URINE PIGMENT        Recent Labs       Lab Results  Component Value Date    LABMICR See below: 02/22/2023    WBCUA 0-5 02/22/2023    LABEPIT 0-10 02/22/2023    BACTERIA Few (A) 02/22/2023        Pertinent Imaging:   No results found for this or any previous visit.   Results for orders placed during the hospital encounter of 10/05/07   US VenoUS Imaging Bilateral   Narrative Clinical Data: Left leg pain and swelling   VENOUS DUPLEX ULTRASOUND OF BILATERAL LOWER EXTREMITIES   Technique: Gray-scale sonography with graded compression, as well as color Doppler and duplex ultrasound, were performed to evaluate the deep venous system of both lower extremities from the level of the common femoral vein through the popliteal and proximal calf veins. Spectral Doppler was utilized to evaulate flow at rest and with distal augmentation maneuvers. Confirmed with physician's office that bilateral lower extremity exam requested.   Comparison: None   Findings: Deep venous system patent and compressible from the groin through popliteal fossa bilaterally.Marland Kitchen Spontaneous venous flow present with intact augmentation and evidence of respiratory phasicity. No intraluminal thrombus identified. Visualized portions of the greater saphenous systems unremarkable.   IMPRESSION: No evidence of deep venous thrombosis.   Provider: Morley Kos   No results found for this or any previous visit.   No results found for this or any previous visit.   Results for orders placed during the hospital encounter of 02/02/23   US Renal   Narrative CLINICAL DATA:  Painless hematuria  EXAM: RENAL / URINARY TRACT ULTRASOUND COMPLETE   COMPARISON:  CT renal stone 01/31/2023   FINDINGS: Right Kidney:   Renal measurements: 12.7 x 4.4 x 5.7 cm = volume: 161  mL. Echogenicity within normal limits. No mass or hydronephrosis visualized.   Left Kidney:   Renal measurements: 11.5 x 6.4 x 5.3 cm = volume: 205 mL. Echogenicity within normal limits. No mass or hydronephrosis visualized.   Bladder:   Under distended and not well evaluated.   Other:   None.   IMPRESSION: Normal renal ultrasound.     Electronically Signed By: Darliss Cheney M.D. On: 02/02/2023 18:00   No results found for this or any previous visit.   Results for orders placed in visit on 02/03/23   CT HEMATURIA WORKUP   Narrative CLINICAL DATA:  Gross hematuria.   EXAM: CT ABDOMEN AND PELVIS WITHOUT AND WITH CONTRAST   TECHNIQUE: Multidetector CT imaging of the abdomen and pelvis was performed following the standard protocol before and following the bolus administration of intravenous contrast.   RADIATION DOSE REDUCTION: This exam was performed according to the departmental dose-optimization program which includes automated exposure control, adjustment of the mA and/or kV according to patient size and/or use of iterative reconstruction technique.   CONTRAST:  OMNIPAQUE IOHEXOL 300 MG/ML  SOLN   COMPARISON:  CT renal stone protocol from 01/31/2023.   FINDINGS: Lower chest: There is a spiculated marginated 1.6 x 2.1 cm nodule in the right lung lower lobe, which was present but partially imaged on the recent prior exam. There is also a new pleural-based 8 x 14 mm nodule in the right lung lower lobe. Findings are concerning for neoplastic process. Further evaluation with dedicated contrast-enhanced chest CT scan is recommended. The lung bases are otherwise clear. No pleural effusion or consolidation. The heart is normal in size. No pericardial effusion.   Hepatobiliary: The liver is normal in size. Non-cirrhotic configuration. No suspicious mass. No intrahepatic bile duct dilation. There is moderate dilation of the extrahepatic bile duct, most  likely due to post cholecystectomy status. Gallbladder is surgically absent.   Pancreas: Unremarkable. No pancreatic ductal dilatation or surrounding inflammatory changes.   Spleen: Surgically absent. There is an accessory splenule in the left upper quadrant. There are also multiple smaller hyperattenuating nodules along the left diaphragmatic undersurface, also favored to represent splenules.   Adrenals/Urinary Tract: Adrenal glands are unremarkable.   Non-contrast images: No radiopaque urinary tract calculi.   Kidneys: Symmetric enhancement. No suspicious renal mass.   Urinary Tract Opacification: Adequate.   Collecting Systems and Ureters: No filling defects, masses, strictures, or areas of abnormal dilatation.   Urinary Bladder: Is decompressed precluding optimal assessment. However, note is made of mild bladder wall mucosal hyperenhancement and subtle perivesical fat stranding, concerning for cystitis. Correlate clinically and with urinalysis. No focal mass or bladder calculi.   Stomach/Bowel: No disproportionate dilation of the small or large bowel loops. No evidence of abnormal bowel wall thickening or inflammatory changes. The appendix was not visualized; however there is no acute inflammatory process in the right lower quadrant.   Vascular/Lymphatic: No ascites or pneumoperitoneum. No abdominal or pelvic lymphadenopathy, by size criteria. No aneurysmal dilation of the major abdominal arteries. There are moderate peripheral atherosclerotic vascular calcifications of the aorta and its major branches.   Reproductive: The uterus is surgically absent. No large adnexal mass.   Other: There is a tiny fat containing umbilical hernia. The soft tissues and abdominal wall are otherwise unremarkable.  Musculoskeletal: No suspicious osseous lesions. There are mild multilevel degenerative changes in the visualized spine.   IMPRESSION: *No nephroureterolithiasis or  obstructive uropathy. No suspicious renal, ureteric or urinary bladder mass. Mild urinary bladder wall mucosal hyperenhancement and subtle perivesical fat stranding, concerning for cystitis. Correlate clinically and with urinalysis. *There is a spiculated marginated 1.6 x 2.1 cm nodule in the right lung lower lobe and an additional pleural-based 8 x 14 mm nodule in the right lung lower lobe, which is new since the recent prior exam. Findings are concerning for neoplastic process. Further evaluation with dedicated contrast-enhanced chest CT scan is recommended, unless recently performed. *Multiple other nonacute observations, as described above.   Aortic Atherosclerosis (ICD10-I70.0).     Electronically Signed By: Jules Schick M.D. On: 02/23/2023 14:38   Results for orders placed during the hospital encounter of 01/31/23   CT Renal Stone Study   Narrative CLINICAL DATA:  Lower abdominal and flank pain.  Gross hematuria.   EXAM: CT ABDOMEN AND PELVIS WITHOUT CONTRAST   TECHNIQUE: Multidetector CT imaging of the abdomen and pelvis was performed following the standard protocol without IV contrast.   RADIATION DOSE REDUCTION: This exam was performed according to the departmental dose-optimization program which includes automated exposure control, adjustment of the mA and/or kV according to patient size and/or use of iterative reconstruction technique.   COMPARISON:  08/04/2022   FINDINGS: Lower chest: Pulmonary nodule is seen in the medial right lower lobe, which is incompletely visualized but measures at least 13 mm in diameter. A few other tiny less than 5 mm pulmonary nodules also seen in the right lower lobe.   Hepatobiliary: No mass visualized on this unenhanced exam. Prior cholecystectomy again noted. Dilated common bile duct is unchanged, and without significant intrahepatic biliary ductal dilatation.   Pancreas: No mass or inflammatory process visualized on  this unenhanced exam.   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   Adrenals/Urinary tract: No evidence of urolithiasis or hydronephrosis. Unremarkable unopacified urinary bladder.   Stomach/Bowel: No evidence of obstruction, inflammatory process, or abnormal fluid collections. Diverticulosis is seen mainly involving the sigmoid colon, however there is no evidence of diverticulitis.   Vascular/Lymphatic: No pathologically enlarged lymph nodes identified. No evidence of abdominal aortic aneurysm.   Reproductive: Prior hysterectomy noted. Adnexal regions are unremarkable in appearance.   Other:  None.   Musculoskeletal:  No suspicious bone lesions identified.   IMPRESSION: No evidence of urolithiasis, hydronephrosis, or other acute findings.   Colonic diverticulosis, without radiographic evidence of diverticulitis.   Several right lower lobe nodules, largest is incompletely visualized but measures at least 13 mm. Chest CT is recommended for further evaluation.     Electronically Signed By: Danae Orleans M.D. On: 01/31/2023 17:03     Assessment & Plan:     1. Bladder cancer We discussed the natural history of high grade bladder cancer and the treatment options including surveillance versus repeat resection versusu repeat resection and BCG therapy. After discussing the options the patient elects for repeat resection and BCG therapy

## 2023-04-29 NOTE — Anesthesia Preprocedure Evaluation (Signed)
Anesthesia Evaluation  Patient identified by MRN, date of birth, ID band Patient awake    Reviewed: Allergy & Precautions, H&P , NPO status , Patient's Chart, lab work & pertinent test results  Airway Mallampati: II  TM Distance: >3 FB Neck ROM: Full    Dental no notable dental hx.    Pulmonary COPD, Current Smoker (smoked 1 day ago) and Patient abstained from smoking., former smoker   Pulmonary exam normal breath sounds clear to auscultation       Cardiovascular Exercise Tolerance: Poor hypertension, + CAD, + Past MI, + Cardiac Stents and +CHF  Normal cardiovascular exam+ dysrhythmias (-) pacemaker (.)+ Valvular Problems/Murmurs  Rhythm:Regular Rate:Normal     Neuro/Psych  Headaches PSYCHIATRIC DISORDERS Anxiety Depression     Neuromuscular disease CVA (left face, right side of body), Residual Symptoms    GI/Hepatic hiatal hernia,GERD  Medicated and Controlled,,(+) Hepatitis -  Endo/Other  diabetesHypothyroidism    Renal/GU negative Renal ROS  negative genitourinary   Musculoskeletal  (+) Arthritis ,    Abdominal  (+) + obese  Peds negative pediatric ROS (+)  Hematology negative hematology ROS (+)   Anesthesia Other Findings   Reproductive/Obstetrics negative OB ROS                             Anesthesia Physical Anesthesia Plan  ASA: 3  Anesthesia Plan: General   Post-op Pain Management: Minimal or no pain anticipated   Induction: Intravenous  PONV Risk Score and Plan: 1 and Ondansetron  Airway Management Planned: LMA  Additional Equipment:   Intra-op Plan:   Post-operative Plan: Extubation in OR  Informed Consent: I have reviewed the patients History and Physical, chart, labs and discussed the procedure including the risks, benefits and alternatives for the proposed anesthesia with the patient or authorized representative who has indicated his/her understanding and  acceptance.     Dental advisory given  Plan Discussed with: CRNA  Anesthesia Plan Comments:        Anesthesia Quick Evaluation

## 2023-04-29 NOTE — Op Note (Addendum)
Preoperative diagnosis: Bladder cancer   Postoperative diagnosis: Same   Procedure: 1 cystoscopy 2. Transurethral resection of bladder tumor, medium 3. Instillation of bladder chemotherapy agent   Attending: Cleda Mccreedy   Anesthesia: General   Estimated blood loss: Minimal   Drains: 18 French foley   Specimens: bladder tumor   Antibiotics: ancef   Findings: 3cm sessile right lateral wall tumor at previous resection site.  Ureteral orifices in normal anatomic location.    Indications: Patient is a 74 year old female with T1G3 bladder cancer here for repeat resection.  After discussing treatment options, they decided proceed with transurethral resection of a bladder tumor.   Procedure in detail: The patient was brought to the operating room and a brief timeout was done to ensure correct patient, correct procedure, correct site.  General anesthesia was administered patient was placed in dorsal lithotomy position.  Their genitalia was then prepped and draped in usual sterile fashion.  A rigid 22 French cystoscope was passed in the urethra and the bladder.  Bladder was inspected and we noted a 3cm bladder tumor.  the ureteral orifices were in the normal orthotopic locations.  We then removed the cystoscope and placed a resectoscope into the bladder. We proceeded to remove the large clot burden from the bladder. Once this was complete we turned our attention to the bladder tumor. Using the bipolar resectoscope we removed the bladder tumor down to the base. Hemostasis was then obtained with electrocautery. We then removed the bladder tumor chips and sent them for pathology. We then re-inspected the bladder and found no residula bleeding.  the bladder was then drained, a 18 French foley was placed and 2g of gemcitabine was instilled in to the bladder. This concluded the procedure which was well tolerated by patient.   Complications: None   Condition: Stable, extubated, transferred to PACU    Plan: Patient is to have the foley drained in 1 hour and be discharged home with the foley in place. She will followup in 1 week for foley removal and pathology discussion

## 2023-04-29 NOTE — Anesthesia Procedure Notes (Addendum)
 Procedure Name: Intubation Date/Time: 04/29/2023 9:44 AM  Performed by: Anda Latina, RNPre-anesthesia Checklist: Patient identified, Emergency Drugs available, Suction available, Patient being monitored and Timeout performed Patient Re-evaluated:Patient Re-evaluated prior to induction Oxygen Delivery Method: Circle system utilized Preoxygenation: Pre-oxygenation with 100% oxygen Induction Type: IV induction Ventilation: Mask ventilation without difficulty Laryngoscope Size: Glidescope and 3 Grade View: Grade I Tube type: Oral Tube size: 7.0 mm Number of attempts: 3 Airway Equipment and Method: Rigid stylet and Video-laryngoscopy Placement Confirmation: ETT inserted through vocal cords under direct vision, positive ETCO2 and breath sounds checked- equal and bilateral Secured at: 21 cm Tube secured with: Tape Dental Injury: Teeth and Oropharynx as per pre-operative assessment  Comments: DL x 2 by SRNA and CRNA with Hyacinth Meeker 3 poor visualization. DL with Glidescope gr 1 view.

## 2023-04-29 NOTE — Transfer of Care (Signed)
Immediate Anesthesia Transfer of Care Note  Patient: [name]  Procedure(s) Performed: CYSTOSCOPY WITH BIOPSY TRANSURETHRAL RESECTION OF BLADDER TUMOR (TURBT) BLADDER INSTILLATION- gemcitabine  Patient Location: PACU  Anesthesia Type:General  Level of Consciousness: awake  Airway & Oxygen Therapy: Patient Spontanous Breathing  Post-op Assessment: Report given to RN and Post -op Vital signs reviewed and stable  Post vital signs: Reviewed and stable  Last Vitals:  Vitals Value Taken Time  BP 155/85 04/29/23 1021  Temp    Pulse 90 04/29/23 1022  Resp 20 04/29/23 1022  SpO2 94 % 04/29/23 1022  Vitals shown include unfiled device data.  Last Pain:  Vitals:   04/29/23 0801  PainSc: 9       Patients Stated Pain Goal: 6 (04/29/23 0801)  Complications: No notable events documented.

## 2023-04-29 NOTE — Anesthesia Postprocedure Evaluation (Signed)
Anesthesia Post Note  Patient: [name]  Procedure(s) Performed: CYSTOSCOPY WITH BIOPSY TRANSURETHRAL RESECTION OF BLADDER TUMOR (TURBT) BLADDER INSTILLATION- gemcitabine  Patient location during evaluation: PACU Anesthesia Type: General Level of consciousness: awake and alert Pain management: pain level controlled Vital Signs Assessment: post-procedure vital signs reviewed and stable Respiratory status: spontaneous breathing, nonlabored ventilation and respiratory function stable Cardiovascular status: blood pressure returned to baseline and stable Postop Assessment: no apparent nausea or vomiting Anesthetic complications: no   No notable events documented.   Last Vitals:  Vitals:   04/29/23 1115 04/29/23 1134  BP: (!) 176/82 (!) 171/84  Pulse: 88 90  Resp: 19 18  Temp:  36.4 C  SpO2: 92% 93%    Last Pain:  Vitals:   04/29/23 1134  TempSrc: Oral  PainSc: 9                  Roslynn Amble

## 2023-04-30 ENCOUNTER — Encounter (HOSPITAL_COMMUNITY): Payer: Self-pay | Admitting: Urology

## 2023-05-03 NOTE — Progress Notes (Deleted)
 Name: [name] DOB: 1950/07/14 MRN: [id number]  Diagnoses: Post-operative state  HPI: [name] presents post-operatively.  ***She is accompanied by ***. GU History: 1. Bladder cancer.  Today She presents s/p the following procedures by Dr. Ronne Binning on 04/29/2023:  Preoperative diagnosis: Bladder cancer   Postoperative diagnosis: Same   Procedure:  1. cystoscopy 2. Transurethral resection of bladder tumor, medium 3. Instillation of bladder chemotherapy agent  Pathology: ***  Postop course: Today She reports ***  She reports the catheter is draining ***.  She {Actions; denies-reports:120008} gross hematuria.  She {Actions; denies-reports:120008} acute flank pain, abdominal pain, fevers, nausea, or vomiting.   Fall Screening: Do you usually have a device to assist in your mobility? {yes/no:20286} ***cane / ***walker / ***wheelchair   Medications: Current Outpatient Medications  Medication Sig Dispense Refill   oxyCODONE-acetaminophen (PERCOCET) 5-325 MG tablet Take 1 tablet by mouth every 4 (four) hours as needed for severe pain (pain score 7-10). 30 tablet 0   No current facility-administered medications for this visit.    Allergies: Allergies  Allergen Reactions   Gabapentin Nausea And Vomiting    Other Reaction(s): Not available   Prednisone Palpitations    Other Reaction(s): Not available   Carvedilol     Stopped due to bradycardia   Clonidine Derivatives     Stopped due to bradycardia    Diltiazem     Stopped due to bradycardia    Tape Other (See Comments)    Causes blisters to form   Latex Other (See Comments) and Rash    Blisters  Other Reaction(s): Not available    Past Medical History:  Diagnosis Date   Anxiety    Arthritis    "hands, arms, back, neck, knees, fingers" (01/21/2016)   Bradycardia    Breast cancer, left breast (HCC) 1970s; 2015   CAD (coronary artery disease)    a. s/p NSTEMI in 2017 with DES to OM and  unsuccessful PCI to RCA with wire dissection b. 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   Melanoma of forearm, left (HCC)    Migraine    "a few migraines/year" (01/21/2016)   Obesity    Stroke (HCC) 04/2003   2 carotid artery stents in place; small left parietal and left hemispheric CVA.[name] 01/21/2016   Tobacco use disorder    50 pack years; questionably discontinued in 2010   Wears glasses    Past Surgical History:  Procedure Laterality Date   ABDOMINAL HYSTERECTOMY  1978   APPENDECTOMY     BIOPSY  04/28/2022   Procedure: BIOPSY;  Surgeon: Dolores Frame, MD;  [facility];  Service: Gastroenterology;;   BLADDER INSTILLATION N/A 04/29/2023   Procedure: BLADDER INSTILLATION- gemcitabine;  Surgeon: Malen Gauze, MD;  Location: [facility];  Service: Urology;  Laterality: N/A;   BREAST BIOPSY Left 1970s; 2015   BREAST LUMPECTOMY Left 1970s   BREAST LUMPECTOMY WITH NEEDLE LOCALIZATION Left 07/07/2013   Procedure: BREAST LUMPECTOMY WITH NEEDLE LOCALIZATION;  Surgeon: Robyne Askew, MD;  Location: [facility];  Service: General;  Laterality: Left;   CARDIAC CATHETERIZATION N/A 01/22/2016   Procedure: Left Heart Cath and Coronary Angiography;  Surgeon: Peter M Swaziland, MD;  Location: [facility] INVAS[facility] CV LAB;  Service: Cardiovascular;  Laterality: N/A;   CARDIAC CATHETERIZATION N/A 01/22/2016   Procedure: Coronary Stent Intervention;   Surgeon: Peter M Swaziland, MD;  Location: [facility] INVASIVE CV LAB;  Service: Cardiovascular;  Laterality: N/A;   CAROTID STENT Left 04/2003   small left parietal and left hemispheric CVA/notes 07/29/2010   CHOLECYSTECTOMY OPEN     COLONOSCOPY  2011   COLONOSCOPY N/A 05/10/2019   Procedure: COLONOSCOPY;  Surgeon: Malissa Hippo, MD;  [facility];  Service: Endoscopy;  Laterality: N/A;  1:45   COLONOSCOPY WITH PROPOFOL N/A 09/01/2022   Procedure: COLONOSCOPY WITH PROPOFOL;  Surgeon: Dolores Frame, MD;  [facility];  Service: Gastroenterology;  Laterality: N/A;  1:45PM;ASA 3   CORONARY ATHERECTOMY N/A 12/29/2019   Procedure: CORONARY ATHERECTOMY;  Surgeon: Corky Crafts, MD;  Location: [facility] INVASIVE CV LAB;  Service: Cardiovascular;  Laterality: N/A;  Prox RCA   CORONARY STENT INTERVENTION N/A 12/29/2019   Procedure: CORONARY STENT INTERVENTION;  Surgeon: Corky Crafts, MD;  Location: [facility] INVASIVE CV LAB;  Service: Cardiovascular;  Laterality: N/A;  Prox RCA   CORONARY ULTRASOUND/IVUS N/A 12/29/2019   Procedure: Intravascular Ultrasound/IVUS;  Surgeon: Corky Crafts, MD;  Location: [facility] INVASIVE CV LAB;  Service: Cardiovascular;  Laterality: N/A;   CYSTOSCOPY WITH BIOPSY N/A 04/29/2023   Procedure: CYSTOSCOPY WITH BIOPSY;  Surgeon: Malen Gauze, MD;  Location: [facility];  Service: Urology;  Laterality: N/A;   ESOPHAGEAL DILATION N/A 04/28/2022   Procedure: ESOPHAGEAL DILATION;  Surgeon: Dolores Frame, MD;  [facility];  Service: Gastroenterology;  Laterality: N/A;   ESOPHAGOGASTRODUODENOSCOPY  08/06/2011   Procedure: ESOPHAGOGASTRODUODENOSCOPY (EGD);  Surgeon: Malissa Hippo, MD;  [facility];  Service: Endoscopy;  Laterality: N/A;   ESOPHAGOGASTRODUODENOSCOPY N/A 05/10/2019   Procedure: ESOPHAGOGASTRODUODENOSCOPY (EGD);  Surgeon: Malissa Hippo, MD;  [facility];  Service: Endoscopy;  Laterality:  N/A;   ESOPHAGOGASTRODUODENOSCOPY (EGD) WITH PROPOFOL N/A 04/28/2022   Procedure: ESOPHAGOGASTRODUODENOSCOPY (EGD) WITH PROPOFOL;  Surgeon: Dolores Frame, MD;  [facility];  Service: Gastroenterology;  Laterality: N/A;  10:30 am, pt can't move up due to transportation   FLEXIBLE SIGMOIDOSCOPY  08/06/2011   Procedure: FLEXIBLE SIGMOIDOSCOPY;  Surgeon: Malissa Hippo, MD;  [facility];  Service: Endoscopy;  Laterality: N/A;   HEMOSTASIS CLIP PLACEMENT  09/01/2022   Procedure: HEMOSTASIS CLIP PLACEMENT;  Surgeon: Dolores Frame, MD;  [facility];  Service: Gastroenterology;;   INGUINAL HERNIA REPAIR Left 1970s   IR ANGIO INTRA EXTRACRAN SEL COM CAROTID INNOMINATE BILAT MOD SED  11/01/2020   IR ANGIO VERTEBRAL SEL VERTEBRAL BILAT MOD SED  11/01/2020   IR RADIOLOGIST EVAL & MGMT  09/23/2020   IR US GUIDE VASC ACCESS RIGHT  11/01/2020   LARYNX SURGERY  1970s   Polyps excised   LEFT HEART CATH AND CORONARY ANGIOGRAPHY N/A 12/29/2019   Procedure: LEFT HEART CATH AND CORONARY ANGIOGRAPHY;  Surgeon: Corky Crafts, MD;  Location: [facility] INVASIVE CV  LAB;  Service: Cardiovascular;  Laterality: N/A;   LEFT HEART CATH AND CORONARY ANGIOGRAPHY N/A 10/12/2022   Procedure: LEFT HEART CATH AND CORONARY ANGIOGRAPHY;  Surgeon: Corky Crafts, MD;  Location: [facility] INVASIVE CV LAB;  Service: Cardiovascular;  Laterality: N/A;   MALONEY DILATION  05/10/2019   Procedure: [name] DILATION;  Surgeon: Malissa Hippo, MD;  [facility];  Service: Endoscopy;;   MELANOMA EXCISION Left ~ 2016   forearm   POLYPECTOMY  05/10/2019   Procedure: POLYPECTOMY;  Surgeon: Malissa Hippo, MD;  [facility];  Service: Endoscopy;;  colon   POLYPECTOMY  09/01/2022   Procedure: POLYPECTOMY INTESTINAL;  Surgeon: Dolores Frame, MD;  [facility];  Service: Gastroenterology;;   SPLENECTOMY, 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   spontaneous rupture    SPLENECTOMY, TOTAL     SUBMUCOSAL LIFTING INJECTION  09/01/2022   Procedure: SUBMUCOSAL LIFTING INJECTION;  Surgeon: Dolores Frame, MD;  [facility];  Service: Gastroenterology;;   SUBMUCOSAL TATTOO INJECTION  09/01/2022   Procedure: SUBMUCOSAL TATTOO INJECTION;  Surgeon: Dolores Frame, MD;  [facility];  Service: Gastroenterology;;   TEMPORARY PACEMAKER N/A 12/29/2019   Procedure: TEMPORARY PACEMAKER;  Surgeon: Corky Crafts, MD;  Location: [facility] INVASIVE CV LAB;  Service: Cardiovascular;  Laterality: N/A;   TRANSURETHRAL RESECTION OF BLADDER TUMOR N/A 03/15/2023   Procedure: TRANSURETHRAL RESECTION OF BLADDER TUMOR (TURBT)with Gemcitibane;  Surgeon: Malen Gauze, MD;  Location: [facility];  Service: Urology;  Laterality: N/A;   TRANSURETHRAL RESECTION OF BLADDER TUMOR N/A 04/29/2023   Procedure: TRANSURETHRAL RESECTION OF BLADDER TUMOR (TURBT);  Surgeon: Malen Gauze, MD;  Location: [facility];  Service: Urology;  Laterality: N/A;   Family History  Problem Relation Age of Onset   Heart failure Mother    Cancer Father    Heart failure Father    Cancer Sister    Dementia Sister    Heart disease Other    Arthritis Other    Cancer Other    Diabetes Other    Kidney disease Other    Cancer Sister    Heart failure Brother    Social History   Socioeconomic History   Marital status: Divorced    Spouse name: Not on file   Number of children: 2   Years of education: Not on file   Highest education level: Not on file  Occupational History   Occupation: Disabled   Occupation: retired  Tobacco Use   Smoking status: Former    Current packs/day: 0.50    Average packs/day: 0.5 packs/day for 40.0 years (20.0 ttl pk-yrs)    Types: Cigarettes    Passive exposure: Current   Smokeless tobacco: Never   Tobacco comments:    Trying to quit with use of Chantix and calm as of 04/13/2023  Vaping Use   Vaping status: Never Used   Substance and Sexual Activity   Alcohol use: Not Currently   Drug use: Never   Sexual activity: Not Currently  Other Topics Concern   Not on file  Social History Narrative   ** Merged History Encounter **       Right handed   Wears glasses    Drinks 1 cup of coffee daily   Drinks sweet tea twice a week.   Social Drivers of Corporate investment banker Strain: Low Risk  (12/15/2022)   Overall Financial Resource Strain (CARDIA)    Difficulty of Paying Living Expenses: Not  hard at all  Food Insecurity: No Food Insecurity (12/15/2022)   Hunger Vital Sign    Worried About Running Out of Food in the Last Year: Never true    Ran Out of Food in the Last Year: Never true  Transportation Needs: No Transportation Needs (12/15/2022)   PRAPARE - Administrator, Civil Service (Medical): No    Lack of Transportation (Non-Medical): No  Physical Activity: Sufficiently Active (12/15/2022)   Exercise Vital Sign    Days of Exercise per Week: 7 days    Minutes of Exercise per Session: 30 min  Stress: Stress Concern Present (12/15/2022)   [employer] of Occupational Health - Occupational Stress Questionnaire    Feeling of Stress : To some extent  Social Connections: Moderately Isolated (12/15/2022)   Social Connection and Isolation Panel [NHANES]    Frequency of Communication with Friends and Family: More than three times a week    Frequency of Social Gatherings with Friends and Family: More than three times a week    Attends Religious Services: More than 4 times per year    Active Member of [employer] or Organizations: No    Attends Banker Meetings: Never    Marital Status: Divorced  Catering manager Violence: Not At Risk (12/15/2022)   Humiliation, Afraid, Rape, and Kick questionnaire    Fear of Current or Ex-Partner: No    Emotionally Abused: No    Physically Abused: No    Sexually Abused: No    SUBJECTIVE  Review of Systems Constitutional: Patient denies any  unintentional weight loss or change in strength lntegumentary: Patient denies any rashes or pruritus Cardiovascular: Patient denies chest pain or syncope Respiratory: Patient denies shortness of breath Gastrointestinal: Patient ***denies nausea, vomiting, constipation, or diarrhea Musculoskeletal: Patient denies muscle cramps or weakness Neurologic: Patient denies convulsions or seizures Allergic/Immunologic: Patient denies recent allergic reaction(s) Hematologic/Lymphatic: Patient denies bleeding tendencies Endocrine: Patient denies heat/cold intolerance  GU: As per HPI.  OBJECTIVE There were no vitals filed for this visit. There is no height or weight on file to calculate BMI.  Physical Examination Constitutional: No obvious distress; patient is non-toxic appearing  Cardiovascular: No visible lower extremity edema.  Respiratory: The patient does not have audible wheezing/stridor; respirations do not appear labored  Gastrointestinal: Abdomen non-distended Musculoskeletal: Normal ROM of UEs  Skin: No obvious rashes/open sores  Neurologic: CN 2-12 grossly intact Psychiatric: Answered questions appropriately with normal affect  Hematologic/Lymphatic/Immunologic: No obvious bruises or sites of spontaneous bleeding  ***GU: Catheter draining ***clear yellow urine.  UA:  ***positive for *** leukocytes, *** blood, ***nitrites ***Urine microscopy:  ***negative  *** WBC/hpf, *** RBC/hpf, *** bacteria ***with no evidence of UTI ***with no evidence of microscopic hematuria ***otherwise unremarkable ***glucosuria (secondary to ***Jardiance ***Farxiga use)  PVR: *** ml  ASSESSMENT No diagnosis found. *** We reviewed the operative procedures and findings.  Pre-operative symptoms are *** since the procedure.   ***voiding trial?  We agreed to plan for follow up in *** months or sooner if needed. Patient verbalized understanding of and agreement with current plan. All questions were  answered.  PLAN Advised the following: Foley catheter ***discontinued. ***No follow-ups on file.  ***No global period (charge regular E&M) - Cryotherapy - Cystoscopy w/ bladder Botox - Cystoscopy w/ bladder biopsy - Cystoscopy w/ retrograde pyelogram - Cystoscopy w/ hydrodistention - Cystoscopy w/ ureteral stent placement - Cystolitholapaxy - Ureteroscopy w/ ureteral stent placement - Ureteroscopy w/ stone removal - Urolift - TURBT - Prostate biopsy  ***  10 day global period: - Circumcision  ***Everything else pretty much is 90 day global period (including TURP)  No orders of the defined types were placed in this encounter.   It has been explained that the patient is to follow regularly with their PCP in addition to all other providers involved in their care and to follow instructions provided by these respective offices. Patient advised to contact urology clinic if any urologic-pertaining questions, concerns, new symptoms or problems arise in the interim period.  There are no Patient Instructions on file for this visit.  Electronically signed by:  Donnita Falls, MSN, FNP-C, CUNP 05/03/2023 9:00 AM

## 2023-05-04 ENCOUNTER — Telehealth: Payer: Self-pay | Admitting: Urology

## 2023-05-04 ENCOUNTER — Ambulatory Visit (INDEPENDENT_AMBULATORY_CARE_PROVIDER_SITE_OTHER): Payer: 59

## 2023-05-04 DIAGNOSIS — C672 Malignant neoplasm of lateral wall of bladder: Secondary | ICD-10-CM

## 2023-05-04 DIAGNOSIS — R310 Gross hematuria: Secondary | ICD-10-CM

## 2023-05-04 MED ORDER — CIPROFLOXACIN HCL 500 MG PO TABS
500.0000 mg | ORAL_TABLET | Freq: Once | ORAL | Status: AC
  Administered 2023-05-04: 500 mg via ORAL

## 2023-05-04 NOTE — Telephone Encounter (Signed)
Per MD ok to place patient on NV schedule this morning for voiding trial and patient will keep scheduled post op with provider on Thursday.  Patient made aware and voiced understanding.

## 2023-05-04 NOTE — Telephone Encounter (Signed)
Patient called this morning said her cath is bothering her and she wants to either come in today to have it taken out or go to the ED to have it removed. Please call

## 2023-05-04 NOTE — Progress Notes (Addendum)
Fill and Pull Catheter Removal  Patient is present today for a catheter removal.  100 ml of sterile water was instilled into the bladder , due to patient having multiple bladder spasm catheter was removed and advised to return at 3 pm for PVR. 10 ml of water was then drained from the balloon.  A 16 FR foley cath was removed from the bladder no complications were noted .  Foley catheter intact and time of removal. Patient as then given some time to void on their own.  Patient cannot void  0 ml on their own after some time.  Patient tolerated well.  One oral prophylactic antibiotic given per MD orders  Catheter Removal  Patient is present today for a catheter removal per MD order  10 ml of water was drained from the balloon. A 16 FR foley cath was removed from the bladder, no complications were noted. Patient tolerated well.  Bladder Scan completed today.  Patient can void prior to the bladder scan. Bladder scan result: 27 ML  Additional notes-   Performed by: Kennyth Lose, CMA  Follow up/ Additional notes: Return at 3 pm for PVR

## 2023-05-06 ENCOUNTER — Encounter: Payer: 59 | Admitting: Urology

## 2023-05-06 DIAGNOSIS — C672 Malignant neoplasm of lateral wall of bladder: Secondary | ICD-10-CM

## 2023-05-06 DIAGNOSIS — Z09 Encounter for follow-up examination after completed treatment for conditions other than malignant neoplasm: Secondary | ICD-10-CM

## 2023-05-06 DIAGNOSIS — D494 Neoplasm of unspecified behavior of bladder: Secondary | ICD-10-CM

## 2023-05-07 ENCOUNTER — Ambulatory Visit: Payer: 59 | Admitting: Urology

## 2023-05-07 VITALS — BP 131/82 | HR 86

## 2023-05-07 DIAGNOSIS — C679 Malignant neoplasm of bladder, unspecified: Secondary | ICD-10-CM

## 2023-05-07 DIAGNOSIS — D494 Neoplasm of unspecified behavior of bladder: Secondary | ICD-10-CM

## 2023-05-07 LAB — POCT URINALYSIS DIPSTICK
Bilirubin, UA: NEGATIVE
Glucose, UA: POSITIVE — AB
Ketones, UA: NEGATIVE
Leukocytes, UA: NEGATIVE
Nitrite, UA: NEGATIVE
Protein, UA: POSITIVE — AB
Spec Grav, UA: 1.025 (ref 1.010–1.025)
Urobilinogen, UA: 0.2 U/dL
pH, UA: 6 (ref 5.0–8.0)

## 2023-05-07 NOTE — Progress Notes (Signed)
Bladder Scan completed today.  Patient can void prior to the bladder scan. Bladder scan result: 4  Performed By: [facility] LPN

## 2023-05-07 NOTE — Progress Notes (Signed)
 05/07/2023 3:34 PM   [name] [birth year] [id number]  Referring provider: Benita Stabile, MD [address] [name],  [place] [address]  Followup bladder cancer   HPI: [name] is a 73yo here for followup for bladder cancer. She underwent repeat resection and pathology pending   PMH: Past Medical History:  Diagnosis Date   Anxiety    Arthritis    "hands, arms, back, neck, knees, fingers" (01/21/2016)   Bradycardia    Breast cancer, left breast (HCC) 1970s; 2015   CAD (coronary artery disease)    a. s/p NSTEMI in 2017 with DES to OM and unsuccessful PCI to RCA with wire dissection b. 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   Melanoma of forearm, left (HCC)    Migraine    "a few migraines/year" (01/21/2016)   Obesity    Stroke (HCC) 04/2003   2 carotid artery stents in place; small left parietal and left hemispheric CVA.[name] 01/21/2016   Tobacco use disorder    50 pack years; questionably discontinued in 2010   Wears glasses     Surgical History: Past Surgical History:  Procedure Laterality Date   ABDOMINAL HYSTERECTOMY  1978   APPENDECTOMY     BIOPSY  04/28/2022   Procedure: BIOPSY;  Surgeon: Dolores Frame, MD;  [facility];  Service: Gastroenterology;;   BLADDER INSTILLATION N/A 04/29/2023   Procedure: BLADDER INSTILLATION- gemcitabine;  Surgeon: Malen Gauze, MD;  Location: [facility];  Service: Urology;   Laterality: N/A;   BREAST BIOPSY Left 1970s; 2015   BREAST LUMPECTOMY Left 1970s   BREAST LUMPECTOMY WITH NEEDLE LOCALIZATION Left 07/07/2013   Procedure: BREAST LUMPECTOMY WITH NEEDLE LOCALIZATION;  Surgeon: Robyne Askew, MD;  Location: [facility];  Service: General;  Laterality: Left;   CARDIAC CATHETERIZATION N/A 01/22/2016   Procedure: Left Heart Cath and Coronary Angiography;  Surgeon: Peter M Swaziland, MD;  Location: [facility] INVASIVE CV LAB;  Service: Cardiovascular;  Laterality: N/A;   CARDIAC CATHETERIZATION N/A 01/22/2016   Procedure: Coronary Stent Intervention;  Surgeon: Peter M Swaziland, MD;  Location: [facility] INVASIVE CV LAB;  Service: Cardiovascular;  Laterality: N/A;   CAROTID STENT Left 04/2003   small left parietal and left hemispheric CVA/notes 07/29/2010   CHOLECYSTECTOMY OPEN     COLONOSCOPY  2011   COLONOSCOPY N/A 05/10/2019   Procedure: COLONOSCOPY;  Surgeon: Malissa Hippo, MD;  [facility];  Service: Endoscopy;  Laterality: N/A;  1:45   COLONOSCOPY WITH PROPOFOL N/A 09/01/2022   Procedure: COLONOSCOPY WITH PROPOFOL;  Surgeon: Dolores Frame, MD;  [facility];  Service: Gastroenterology;  Laterality: N/A;  1:45PM;ASA 3   CORONARY ATHERECTOMY N/A 12/29/2019   Procedure: CORONARY ATHERECTOMY;  Surgeon: Corky Crafts, MD;  [facility];  Service: Cardiovascular;  Laterality: N/A;  Prox RCA   CORONARY STENT INTERVENTION N/A 12/29/2019   Procedure: CORONARY STENT INTERVENTION;  Surgeon: Corky Crafts, MD;  Location: [facility] INVASIVE CV LAB;  Service: Cardiovascular;  Laterality: N/A;  Prox RCA   CORONARY ULTRASOUND/IVUS N/A 12/29/2019   Procedure: Intravascular Ultrasound/IVUS;  Surgeon: Corky Crafts, MD;  Location: [facility] INVASIVE CV LAB;  Service: Cardiovascular;  Laterality: N/A;   CYSTOSCOPY WITH BIOPSY N/A 04/29/2023   Procedure: CYSTOSCOPY WITH BIOPSY;  Surgeon: Malen Gauze, MD;  [facility];  Service: Urology;  Laterality: N/A;   ESOPHAGEAL DILATION N/A 04/28/2022   Procedure: ESOPHAGEAL DILATION;  Surgeon: Dolores Frame, MD;  [facility];  Service: Gastroenterology;  Laterality: N/A;   ESOPHAGOGASTRODUODENOSCOPY  08/06/2011   Procedure: ESOPHAGOGASTRODUODENOSCOPY (EGD);  Surgeon: Malissa Hippo, MD;  [facility];  Service: Endoscopy;  Laterality: N/A;   ESOPHAGOGASTRODUODENOSCOPY N/A 05/10/2019   Procedure: ESOPHAGOGASTRODUODENOSCOPY (EGD);  Surgeon: Malissa Hippo, MD;  [facility];  Service: Endoscopy;  Laterality: N/A;   ESOPHAGOGASTRODUODENOSCOPY (EGD) WITH PROPOFOL N/A 04/28/2022   Procedure: ESOPHAGOGASTRODUODENOSCOPY (EGD) WITH PROPOFOL;  Surgeon: Dolores Frame, MD;  [facility];  Service: Gastroenterology;  Laterality: N/A;  10:30 am, pt can't move up due to transportation   FLEXIBLE SIGMOIDOSCOPY  08/06/2011   Procedure: FLEXIBLE SIGMOIDOSCOPY;  Surgeon: Malissa Hippo, MD;  [facility];  Service: Endoscopy;  Laterality: N/A;   HEMOSTASIS CLIP PLACEMENT  09/01/2022   Procedure: HEMOSTASIS CLIP PLACEMENT;  Surgeon: Dolores Frame, MD;  [facility];  Service: Gastroenterology;;   INGUINAL HERNIA REPAIR Left 1970s   IR ANGIO INTRA EXTRACRAN SEL COM CAROTID INNOMINATE BILAT MOD SED  11/01/2020   IR ANGIO VERTEBRAL SEL VERTEBRAL BILAT MOD SED  11/01/2020   IR RADIOLOGIST EVAL & MGMT  09/23/2020   IR US GUIDE VASC ACCESS RIGHT  11/01/2020   LARYNX SURGERY  1970s   Polyps excised   LEFT HEART CATH AND CORONARY ANGIOGRAPHY N/A 12/29/2019   Procedure: LEFT HEART CATH AND CORONARY ANGIOGRAPHY;  Surgeon: Corky Crafts, MD;  [facility];  Service: Cardiovascular;  Laterality: N/A;   LEFT HEART CATH AND CORONARY ANGIOGRAPHY N/A 10/12/2022   Procedure: LEFT HEART CATH AND CORONARY ANGIOGRAPHY;  Surgeon: Corky Crafts, MD;  Location: [facility]  INVASIVE CV LAB;  Service: Cardiovascular;  Laterality: N/A;   MALONEY DILATION  05/10/2019   Procedure: [name] DILATION;  Surgeon: Malissa Hippo, MD;  [facility];  Service: Endoscopy;;   MELANOMA EXCISION Left ~ 2016   forearm   POLYPECTOMY  05/10/2019   Procedure: POLYPECTOMY;  Surgeon: Malissa Hippo, MD;  [facility];  Service: Endoscopy;;  colon   POLYPECTOMY  09/01/2022   Procedure: POLYPECTOMY INTESTINAL;  Surgeon: Dolores Frame, MD;  [facility];  Service: Gastroenterology;;   SPLENECTOMY, 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   spontaneous rupture   SPLENECTOMY, TOTAL     SUBMUCOSAL LIFTING INJECTION  09/01/2022   Procedure: SUBMUCOSAL LIFTING INJECTION;  Surgeon: Dolores Frame, MD;  [facility];  Service: Gastroenterology;;   SUBMUCOSAL TATTOO INJECTION  09/01/2022   Procedure: SUBMUCOSAL TATTOO INJECTION;  Surgeon: Dolores Frame, MD;  [facility];  Service: Gastroenterology;;   TEMPORARY PACEMAKER N/A 12/29/2019   Procedure: TEMPORARY PACEMAKER;  Surgeon: Corky Crafts, MD;  Location: [facility] INVASIVE CV LAB;  Service: Cardiovascular;  Laterality: N/A;   TRANSURETHRAL RESECTION OF BLADDER TUMOR N/A 03/15/2023   Procedure: TRANSURETHRAL RESECTION OF BLADDER TUMOR (TURBT)with Gemcitibane;  Surgeon: Malen Gauze, MD;  [facility];  Service: Urology;  Laterality: N/A;   TRANSURETHRAL RESECTION OF BLADDER TUMOR N/A 04/29/2023   Procedure: TRANSURETHRAL RESECTION OF BLADDER TUMOR (TURBT);  Surgeon: Malen Gauze, MD;  Location: [facility];  Service: Urology;  Laterality: N/A;    Home Medications:  Allergies as of 05/07/2023       Reactions   Gabapentin Nausea And Vomiting   Other Reaction(s): Not available   Prednisone Palpitations   Other Reaction(s): Not available   Carvedilol    Stopped due to bradycardia   Clonidine Derivatives    Stopped due to bradycardia   Diltiazem    Stopped  due to bradycardia   Tape Other (See Comments)   Causes blisters to form   Latex Other (See Comments), Rash   Blisters Other Reaction(s): Not available        Medication List        Accurate as of May 07, 2023  3:34 PM. If you have any questions, ask your nurse or doctor.          oxyCODONE-acetaminophen 5-325 MG tablet Commonly known as: Percocet Take 1 tablet by mouth every 4 (four) hours as needed for severe pain (pain score 7-10).        Allergies:  Allergies  Allergen Reactions   Gabapentin Nausea And Vomiting    Other Reaction(s): Not available   Prednisone Palpitations    Other Reaction(s): Not available   Carvedilol     Stopped due to bradycardia   Clonidine Derivatives     Stopped due to bradycardia    Diltiazem     Stopped due to bradycardia    Tape Other (See Comments)    Causes blisters to form   Latex Other (See Comments) and Rash    Blisters  Other Reaction(s): Not available    Family History: Family History  Problem Relation Age of Onset   Heart failure Mother    Cancer Father    Heart failure Father    Cancer Sister    Dementia Sister    Heart disease Other    Arthritis Other    Cancer Other    Diabetes Other    Kidney disease Other    Cancer Sister    Heart failure Brother     Social History:  reports that she has quit smoking. Her smoking use included cigarettes. She has a 20 pack-year smoking history. She has been exposed to tobacco smoke. She has never used smokeless tobacco. She reports that she does not currently use alcohol. She reports that she does not use drugs.  ROS: All other review of systems were reviewed and are negative except what is noted above in HPI  Physical Exam: BP 131/82   Pulse 86   Constitutional:  Alert and oriented, No acute distress. HEENT: [place] AT, moist mucus membranes.  Trachea midline, no masses. Cardiovascular: No clubbing, cyanosis, or edema. Respiratory: Normal respiratory effort, no  increased work of breathing. GI: Abdomen is soft, nontender, nondistended, no abdominal masses GU: No CVA tenderness.  Lymph: No cervical or inguinal lymphadenopathy. Skin: No rashes, bruises or suspicious lesions. Neurologic: Grossly intact, no focal deficits, moving all 4 extremities. Psychiatric: Normal mood and affect.  Laboratory Data: Lab Results  Component Value Date   WBC 12.8 (H) 02/24/2023   HGB  13.5 02/24/2023   HCT 41.7 02/24/2023   MCV 90.5 02/24/2023   PLT 312 02/24/2023    Lab Results  Component Value Date   CREATININE 0.63 02/24/2023    No results found for: "PSA"  No results found for: "TESTOSTERONE"  Lab Results  Component Value Date   HGBA1C 7.1 (H) 10/19/2022    Urinalysis    Component Value Date/Time   COLORURINE RED (A) 02/02/2023 1156   APPEARANCEUR Cloudy (A) 03/29/2023 0920   LABSPEC  02/02/2023 1156    TEST NOT REPORTED DUE TO COLOR INTERFERENCE OF URINE PIGMENT   PHURINE  02/02/2023 1156    TEST NOT REPORTED DUE TO COLOR INTERFERENCE OF URINE PIGMENT   GLUCOSEU 3+ (A) 03/29/2023 0920   HGBUR (A) 02/02/2023 1156    TEST NOT REPORTED DUE TO COLOR INTERFERENCE OF URINE PIGMENT   BILIRUBINUR negative 05/07/2023 1511   BILIRUBINUR Negative 03/29/2023 0920   KETONESUR (A) 02/02/2023 1156    TEST NOT REPORTED DUE TO COLOR INTERFERENCE OF URINE PIGMENT   PROTEINUR Positive (A) 05/07/2023 1511   PROTEINUR Negative 03/29/2023 0920   PROTEINUR (A) 02/02/2023 1156    TEST NOT REPORTED DUE TO COLOR INTERFERENCE OF URINE PIGMENT   UROBILINOGEN 0.2 05/07/2023 1511   UROBILINOGEN 0.2 11/03/2014 1722   NITRITE negative 05/07/2023 1511   NITRITE Negative 03/29/2023 0920   NITRITE (A) 02/02/2023 1156    TEST NOT REPORTED DUE TO COLOR INTERFERENCE OF URINE PIGMENT   LEUKOCYTESUR Negative 05/07/2023 1511   LEUKOCYTESUR Trace (A) 03/29/2023 0920   LEUKOCYTESUR (A) 02/02/2023 1156    TEST NOT REPORTED DUE TO COLOR INTERFERENCE OF URINE PIGMENT     Lab Results  Component Value Date   LABMICR See below: 03/29/2023   WBCUA 6-10 (A) 03/29/2023   LABEPIT 0-10 03/29/2023   BACTERIA Moderate (A) 03/29/2023    Pertinent Imaging:  No results found for this or any previous visit.  Results for orders placed during the hospital encounter of 10/05/07  US VenoUS Imaging Bilateral  Narrative Clinical Data: Left leg pain and swelling  VENOUS DUPLEX ULTRASOUND OF BILATERAL LOWER EXTREMITIES  Technique: Gray-scale sonography with graded compression, as well as color Doppler and duplex ultrasound, were performed to evaluate the deep venous system of both lower extremities from the level of the common femoral vein through the popliteal and proximal calf veins. Spectral Doppler was utilized to evaulate flow at rest and with distal augmentation maneuvers. Confirmed with physician's office that bilateral lower extremity exam requested.  Comparison: None  Findings: Deep venous system patent and compressible from the groin through popliteal fossa bilaterally.Marland Kitchen Spontaneous venous flow present with intact augmentation and evidence of respiratory phasicity. No intraluminal thrombus identified. Visualized portions of the greater saphenous systems unremarkable.  IMPRESSION: No evidence of deep venous thrombosis.  Provider: Morley Kos  No results found for this or any previous visit.  No results found for this or any previous visit.  Results for orders placed during the hospital encounter of 02/02/23  US Renal  Narrative CLINICAL DATA:  Painless hematuria  EXAM: RENAL / URINARY TRACT ULTRASOUND COMPLETE  COMPARISON:  CT renal stone 01/31/2023  FINDINGS: Right Kidney:  Renal measurements: 12.7 x 4.4 x 5.7 cm = volume: 161 mL. Echogenicity within normal limits. No mass or hydronephrosis visualized.  Left Kidney:  Renal measurements: 11.5 x 6.4 x 5.3 cm = volume: 205 mL. Echogenicity within normal limits. No  mass or hydronephrosis visualized.  Bladder:  Under distended and not  well evaluated.  Other:  None.  IMPRESSION: Normal renal ultrasound.   Electronically Signed By: Darliss Cheney M.D. On: 02/02/2023 18:00  No results found for this or any previous visit.  Results for orders placed in visit on 02/03/23  CT HEMATURIA WORKUP  Narrative CLINICAL DATA:  Gross hematuria.  EXAM: CT ABDOMEN AND PELVIS WITHOUT AND WITH CONTRAST  TECHNIQUE: Multidetector CT imaging of the abdomen and pelvis was performed following the standard protocol before and following the bolus administration of intravenous contrast.  RADIATION DOSE REDUCTION: This exam was performed according to the departmental dose-optimization program which includes automated exposure control, adjustment of the mA and/or kV according to patient size and/or use of iterative reconstruction technique.  CONTRAST:  OMNIPAQUE IOHEXOL 300 MG/ML  SOLN  COMPARISON:  CT renal stone protocol from 01/31/2023.  FINDINGS: Lower chest: There is a spiculated marginated 1.6 x 2.1 cm nodule in the right lung lower lobe, which was present but partially imaged on the recent prior exam. There is also a new pleural-based 8 x 14 mm nodule in the right lung lower lobe. Findings are concerning for neoplastic process. Further evaluation with dedicated contrast-enhanced chest CT scan is recommended. The lung bases are otherwise clear. No pleural effusion or consolidation. The heart is normal in size. No pericardial effusion.  Hepatobiliary: The liver is normal in size. Non-cirrhotic configuration. No suspicious mass. No intrahepatic bile duct dilation. There is moderate dilation of the extrahepatic bile duct, most likely due to post cholecystectomy status. Gallbladder is surgically absent.  Pancreas: Unremarkable. No pancreatic ductal dilatation or surrounding inflammatory changes.  Spleen: Surgically absent. There is an  accessory splenule in the left upper quadrant. There are also multiple smaller hyperattenuating nodules along the left diaphragmatic undersurface, also favored to represent splenules.  Adrenals/Urinary Tract: Adrenal glands are unremarkable.  Non-contrast images: No radiopaque urinary tract calculi.  Kidneys: Symmetric enhancement. No suspicious renal mass.  Urinary Tract Opacification: Adequate.  Collecting Systems and Ureters: No filling defects, masses, strictures, or areas of abnormal dilatation.  Urinary Bladder: Is decompressed precluding optimal assessment. However, note is made of mild bladder wall mucosal hyperenhancement and subtle perivesical fat stranding, concerning for cystitis. Correlate clinically and with urinalysis. No focal mass or bladder calculi.  Stomach/Bowel: No disproportionate dilation of the small or large bowel loops. No evidence of abnormal bowel wall thickening or inflammatory changes. The appendix was not visualized; however there is no acute inflammatory process in the right lower quadrant.  Vascular/Lymphatic: No ascites or pneumoperitoneum. No abdominal or pelvic lymphadenopathy, by size criteria. No aneurysmal dilation of the major abdominal arteries. There are moderate peripheral atherosclerotic vascular calcifications of the aorta and its major branches.  Reproductive: The uterus is surgically absent. No large adnexal mass.  Other: There is a tiny fat containing umbilical hernia. The soft tissues and abdominal wall are otherwise unremarkable.  Musculoskeletal: No suspicious osseous lesions. There are mild multilevel degenerative changes in the visualized spine.  IMPRESSION: *No nephroureterolithiasis or obstructive uropathy. No suspicious renal, ureteric or urinary bladder mass. Mild urinary bladder wall mucosal hyperenhancement and subtle perivesical fat stranding, concerning for cystitis. Correlate clinically and with  urinalysis. *There is a spiculated marginated 1.6 x 2.1 cm nodule in the right lung lower lobe and an additional pleural-based 8 x 14 mm nodule in the right lung lower lobe, which is new since the recent prior exam. Findings are concerning for neoplastic process. Further evaluation with dedicated contrast-enhanced chest CT scan is recommended, unless recently  performed. *Multiple other nonacute observations, as described above.  Aortic Atherosclerosis (ICD10-I70.0).   Electronically Signed By: Jules Schick M.D. On: 02/23/2023 14:38  Results for orders placed during the hospital encounter of 01/31/23  CT Renal Stone Study  Narrative CLINICAL DATA:  Lower abdominal and flank pain.  Gross hematuria.  EXAM: CT ABDOMEN AND PELVIS WITHOUT CONTRAST  TECHNIQUE: Multidetector CT imaging of the abdomen and pelvis was performed following the standard protocol without IV contrast.  RADIATION DOSE REDUCTION: This exam was performed according to the departmental dose-optimization program which includes automated exposure control, adjustment of the mA and/or kV according to patient size and/or use of iterative reconstruction technique.  COMPARISON:  08/04/2022  FINDINGS: Lower chest: Pulmonary nodule is seen in the medial right lower lobe, which is incompletely visualized but measures at least 13 mm in diameter. A few other tiny less than 5 mm pulmonary nodules also seen in the right lower lobe.  Hepatobiliary: No mass visualized on this unenhanced exam. Prior cholecystectomy again noted. Dilated common bile duct is unchanged, and without significant intrahepatic biliary ductal dilatation.  Pancreas: No mass or inflammatory process visualized on this unenhanced exam.  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  Adrenals/Urinary tract: No evidence of urolithiasis or hydronephrosis. Unremarkable unopacified urinary  bladder.  Stomach/Bowel: No evidence of obstruction, inflammatory process, or abnormal fluid collections. Diverticulosis is seen mainly involving the sigmoid colon, however there is no evidence of diverticulitis.  Vascular/Lymphatic: No pathologically enlarged lymph nodes identified. No evidence of abdominal aortic aneurysm.  Reproductive: Prior hysterectomy noted. Adnexal regions are unremarkable in appearance.  Other:  None.  Musculoskeletal:  No suspicious bone lesions identified.  IMPRESSION: No evidence of urolithiasis, hydronephrosis, or other acute findings.  Colonic diverticulosis, without radiographic evidence of diverticulitis.  Several right lower lobe nodules, largest is incompletely visualized but measures at least 13 mm. Chest CT is recommended for further evaluation.   Electronically Signed By: Danae Orleans M.D. On: 01/31/2023 17:03   Assessment & Plan:    1. High grade bladder cancer We have agreed to proceed with 6 week course of BCG - POCT urinalysis dipstick   No follow-ups on file.  Wilkie Aye, MD  [facility]rology [place]

## 2023-05-10 ENCOUNTER — Telehealth: Payer: Self-pay

## 2023-05-10 NOTE — Telephone Encounter (Signed)
 Patient called about her biopsy results. Patient is made aware once MD gets results someone will reach out with MD recommendation. Patient voiced understanding.

## 2023-05-10 NOTE — Telephone Encounter (Signed)
 Faxed Medication Therapy to [facility] [phone] on 05/10/2023.

## 2023-05-11 ENCOUNTER — Encounter: Payer: Self-pay | Admitting: Urology

## 2023-05-11 LAB — CALCULI, WITH PHOTOGRAPH (CLINICAL LAB): Weight Calculi: 22 mg

## 2023-05-11 NOTE — Patient Instructions (Signed)

## 2023-05-12 ENCOUNTER — Telehealth: Payer: Self-pay | Admitting: Physical Medicine & Rehabilitation

## 2023-05-12 MED ORDER — HYDROCODONE-ACETAMINOPHEN 10-325 MG PO TABS: 1.0000 | ORAL_TABLET | Freq: Four times a day (QID) | ORAL | 0 refills | Status: DC | PRN

## 2023-05-12 NOTE — Telephone Encounter (Signed)
 Patient called in requesting status of medication sent to pharmacy , patient states she was told rx for Hydrocodone was going to be sent it for her by 3/6 to [facility]

## 2023-05-14 ENCOUNTER — Telehealth: Payer: Self-pay | Admitting: Urology

## 2023-05-14 ENCOUNTER — Other Ambulatory Visit: Payer: Self-pay | Admitting: Urology

## 2023-05-14 ENCOUNTER — Telehealth (HOSPITAL_COMMUNITY): Payer: 59 | Admitting: Psychiatry

## 2023-05-14 ENCOUNTER — Other Ambulatory Visit: Payer: 59

## 2023-05-14 DIAGNOSIS — R8290 Unspecified abnormal findings in urine: Secondary | ICD-10-CM

## 2023-05-14 DIAGNOSIS — R310 Gross hematuria: Secondary | ICD-10-CM

## 2023-05-14 DIAGNOSIS — R399 Unspecified symptoms and signs involving the genitourinary system: Secondary | ICD-10-CM

## 2023-05-14 LAB — URINALYSIS, ROUTINE W REFLEX MICROSCOPIC
Bilirubin, UA: NEGATIVE
Nitrite, UA: NEGATIVE
Specific Gravity, UA: 1.03 (ref 1.005–1.030)
Urobilinogen, Ur: 1 mg/dL (ref 0.2–1.0)
pH, UA: 6 (ref 5.0–7.5)

## 2023-05-14 LAB — MICROSCOPIC EXAMINATION: RBC, Urine: >30 /[HPF] — AB (ref 0–2)

## 2023-05-14 MED ORDER — NITROFURANTOIN MONOHYD MACRO 100 MG PO CAPS: 100.0000 mg | ORAL_CAPSULE | Freq: Two times a day (BID) | ORAL | 0 refills | Status: AC

## 2023-05-14 NOTE — Telephone Encounter (Signed)
 Dysuria  Patient called with c/o dysuria x 1 days.  Pain:  Pressure after voiding    Severity:5/10  Associated Signs and Symptoms:  Fever: noTemp.0 Chills: yes Hematuria: yes Urgency: yes Frequency: yes Hesitancy:no Incontinence: yes Nausea: no Vomiting: no  Urologic History:  Any Recent Urologic Surgeries or Procedures:yes  Bladder Cancer and. Biopsy of the bladder Recurrent UTI's:no Cystitis: no  Prostatitis:no Kidney or Bladder Stones: no Plan: Walk-in Clinic: no Appointment w/Physician: [no Lab visit scheduled for urine drop off: Yes  Advice given:  Do you take on daily medications for UTI suppression No

## 2023-05-14 NOTE — Telephone Encounter (Signed)
 Patient states she is bleeding bad again and needs to talk to a nurse. She also wants results from biopsy.

## 2023-05-14 NOTE — Telephone Encounter (Signed)
 Patient was made aware and verbalized understanding "Please let pt know I have sent prescription for Macrobid for empiric treatment of suspected UTI while awaiting urine culture. Thanks."

## 2023-05-16 LAB — URINE CULTURE

## 2023-05-17 NOTE — Telephone Encounter (Signed)
Return call to patient with no answer, left vm for return call

## 2023-05-17 NOTE — Telephone Encounter (Signed)
 Would like results from surgery Feb 13

## 2023-05-18 ENCOUNTER — Telehealth: Payer: Self-pay

## 2023-05-18 NOTE — Telephone Encounter (Signed)
 [name] with the [facility] lab called to let you know the lab never received the pathology after her surgery.  It looks like the wrong order was put in. She provided her cell [phone], she states she will be in between labs today.  She doesn't need a call back unless you have any questions for her.

## 2023-05-19 NOTE — Telephone Encounter (Signed)
 Patient calling back to find out if results has been located and results from biopsy.  Concerned if all cancer was removed.  Please advise as soon as possible.

## 2023-05-19 NOTE — Telephone Encounter (Signed)
 MD called and spoke to Ms. Cirilo personally and addressed questions and concerns.

## 2023-05-25 ENCOUNTER — Telehealth (INDEPENDENT_AMBULATORY_CARE_PROVIDER_SITE_OTHER): Payer: 59 | Admitting: Psychiatry

## 2023-05-25 ENCOUNTER — Encounter (HOSPITAL_COMMUNITY): Payer: Self-pay | Admitting: Psychiatry

## 2023-05-25 DIAGNOSIS — F331 Major depressive disorder, recurrent, moderate: Secondary | ICD-10-CM | POA: Diagnosis not present

## 2023-05-25 DIAGNOSIS — F411 Generalized anxiety disorder: Secondary | ICD-10-CM | POA: Diagnosis not present

## 2023-05-25 DIAGNOSIS — F17210 Nicotine dependence, cigarettes, uncomplicated: Secondary | ICD-10-CM

## 2023-05-25 DIAGNOSIS — F5104 Psychophysiologic insomnia: Secondary | ICD-10-CM

## 2023-05-25 DIAGNOSIS — F410 Panic disorder [episodic paroxysmal anxiety] without agoraphobia: Secondary | ICD-10-CM

## 2023-05-25 DIAGNOSIS — F17200 Nicotine dependence, unspecified, uncomplicated: Secondary | ICD-10-CM

## 2023-05-25 MED ORDER — ESCITALOPRAM OXALATE 5 MG PO TABS: 5.0000 mg | ORAL_TABLET | Freq: Every day | ORAL | 2 refills | Status: DC
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
## 2023-05-27 ENCOUNTER — Other Ambulatory Visit: Payer: Self-pay

## 2023-05-27 ENCOUNTER — Ambulatory Visit (INDEPENDENT_AMBULATORY_CARE_PROVIDER_SITE_OTHER): Admitting: Urology

## 2023-05-27 ENCOUNTER — Encounter: Payer: Self-pay | Admitting: Urology

## 2023-05-27 VITALS — BP 170/82 | HR 88 | Temp 98.0°F

## 2023-05-27 DIAGNOSIS — C672 Malignant neoplasm of lateral wall of bladder: Secondary | ICD-10-CM | POA: Diagnosis not present

## 2023-05-27 LAB — URINALYSIS, ROUTINE W REFLEX MICROSCOPIC
Bilirubin, UA: NEGATIVE
Glucose, UA: NEGATIVE
Nitrite, UA: NEGATIVE
Specific Gravity, UA: 1.03 (ref 1.005–1.030)
Urobilinogen, Ur: 1 mg/dL (ref 0.2–1.0)
pH, UA: 6 (ref 5.0–7.5)

## 2023-05-27 MED ORDER — GEMTESA 75 MG PO TABS
75.0000 mg | ORAL_TABLET | Freq: Every day | ORAL | Status: AC
Start: 2023-05-27 — End: ?

## 2023-05-27 MED ORDER — BCG LIVE 50 MG IS SUSR
3.2400 mL | Freq: Once | INTRAVESICAL | Status: AC
  Administered 2023-05-27: 81 mg via INTRAVESICAL

## 2023-05-27 NOTE — Progress Notes (Addendum)
 BCG Bladder Instillation  BCG # 1 of 6  Due to Bladder Cancer, patient is present today for BCG treatment. The patient was cleaned and prepped in a sterile fashion with Betadinex3 A 14FR catheter was inserted, urine return was noted 50ml, urine was Clear yellowin color.  50ml of reconstituted BCG was then instilled into the bladder. The catheter was then removed. Patient tolerated well, no complications were noted The patient given and discussed with BCG education attached to the AVS.   Performed by: Kennyth Lose, CMA  Follow up-1week with nurse visited  Additional comments- nurse visited     Patient was given 6 weeks of sample of Gemtesa in office, per Bernadene Bell

## 2023-05-27 NOTE — Progress Notes (Signed)
 Name: [name] DOB: 1950-05-30 MRN: [id number]  History of Present Illness: [name] is a 73 y.o. female who presents today for follow up visit at [facility] [place].  - GU history: 1. Bladder cancer. - 04/29/2023: Underwent TURBT by Dr. Ronne Binning. 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  Today: BCG Bladder Instillation #1 of 6 She presents today for a BCG treatment for bladder cancer management.   She denies acute urinary symptoms. She denies fevers. She denies flank pain or abdominal pain.  Medications: Current Outpatient Medications  Medication Sig Dispense Refill   aspirin 81 MG chewable tablet Chew 81 mg by mouth daily.     escitalopram (LEXAPRO) 5 MG tablet Take 1 tablet (5 mg total) by mouth at bedtime. 30 tablet 2   furosemide (LASIX) 40 MG tablet Take 40 mg by mouth daily as needed for edema.     HYDROcodone-acetaminophen (NORCO) 10-325 MG tablet Take 1 tablet by mouth every 6 (six) hours as needed for moderate pain (pain score 4-6). 120 tablet 0   lisinopril (ZESTRIL) 5 MG tablet Take 5 mg by mouth daily.     Current Facility-Administered Medications  Medication Dose Route Frequency Provider Last Rate Last Admin   bcg vaccine injection 81 mg  3.24 mL Bladder Instillation Once Donnita Falls, FNP        Allergies: Allergies  Allergen Reactions   Gabapentin Nausea And Vomiting    Other Reaction(s): Not available   Prednisone Palpitations    Other Reaction(s): Not available   Carvedilol     Stopped due to bradycardia   Clonidine Derivatives     Stopped due to bradycardia    Diltiazem     Stopped due to bradycardia    Tape Other (See Comments)    Causes blisters to form   Latex Other (See Comments) and Rash    Blisters  Other Reaction(s): Not available    Past Medical History:  Diagnosis Date   Anxiety    Arthritis    "hands, arms, back, neck, knees, fingers" (01/21/2016)   Bradycardia     Breast cancer, left breast (HCC) 1970s; 2015   CAD (coronary artery disease)    a. s/p NSTEMI in 2017 with DES to OM and unsuccessful PCI to RCA with wire dissection b. 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   Melanoma of forearm, left (HCC)    Migraine    "a few migraines/year" (01/21/2016)   Obesity    Polypharmacy 12/10/2022   Stroke (HCC) 04/2003   2 carotid artery stents in place; small left parietal and left hemispheric CVA.[name] 01/21/2016   Tobacco use disorder    50 pack years; questionably discontinued in 2010   Wears glasses    Past Surgical History:  Procedure Laterality Date  ABDOMINAL HYSTERECTOMY  1978   APPENDECTOMY     BIOPSY  04/28/2022   Procedure: BIOPSY;  Surgeon: Dolores Frame, MD;  [facility];  Service: Gastroenterology;;   BLADDER INSTILLATION N/A 04/29/2023   Procedure: BLADDER INSTILLATION- gemcitabine;  Surgeon: Malen Gauze, MD;  Location: [facility];  Service: Urology;  Laterality: N/A;   BREAST BIOPSY Left 1970s; 2015   BREAST LUMPECTOMY Left 1970s   BREAST LUMPECTOMY WITH NEEDLE LOCALIZATION Left 07/07/2013   Procedure: BREAST LUMPECTOMY WITH NEEDLE LOCALIZATION;  Surgeon: Robyne Askew, MD;  Location: [facility];  Service: General;  Laterality: Left;   CARDIAC CATHETERIZATION N/A 01/22/2016   Procedure: Left Heart Cath and Coronary Angiography;  Surgeon: Peter M Swaziland, MD;  Location: [facility]  INVASIVE CV LAB;  Service: Cardiovascular;  Laterality: N/A;   CARDIAC CATHETERIZATION N/A 01/22/2016   Procedure: Coronary Stent Intervention;  Surgeon: Peter M Swaziland, MD;  Location: [facility] INVASIVE CV LAB;  Service: Cardiovascular;  Laterality: N/A;   CAROTID STENT Left 04/2003   small left parietal and left hemispheric CVA/notes 07/29/2010   CHOLECYSTECTOMY OPEN     COLONOSCOPY  2011   COLONOSCOPY N/A 05/10/2019   Procedure: COLONOSCOPY;  Surgeon: Malissa Hippo, MD;  [facility];  Service: Endoscopy;  Laterality: N/A;  1:45   COLONOSCOPY WITH PROPOFOL N/A 09/01/2022   Procedure: COLONOSCOPY WITH PROPOFOL;  Surgeon: Dolores Frame, MD;  [facility];  Service: Gastroenterology;  Laterality: N/A;  1:45PM;ASA 3   CORONARY ATHERECTOMY N/A 12/29/2019   Procedure: CORONARY ATHERECTOMY;  Surgeon: Corky Crafts, MD;  Location: [facility] INVASIVE CV LAB;  Service: Cardiovascular;  Laterality: N/A;  Prox RCA   CORONARY STENT INTERVENTION N/A 12/29/2019   Procedure: CORONARY STENT INTERVENTION;  Surgeon: Corky Crafts, MD;  Location: [facility] INVASIVE CV LAB;  Service: Cardiovascular;  Laterality: N/A;  Prox RCA   CORONARY ULTRASOUND/IVUS N/A 12/29/2019   Procedure: Intravascular Ultrasound/IVUS;  Surgeon: Corky Crafts, MD;  Location: [facility] INVASIVE CV LAB;  Service: Cardiovascular;  Laterality: N/A;   CYSTOSCOPY WITH BIOPSY N/A 04/29/2023   Procedure: CYSTOSCOPY WITH BIOPSY;  Surgeon: Malen Gauze, MD;  Location: [facility];  Service: Urology;  Laterality: N/A;   ESOPHAGEAL DILATION N/A 04/28/2022   Procedure: ESOPHAGEAL DILATION;  Surgeon: Dolores Frame, MD;  [facility];  Service: Gastroenterology;  Laterality: N/A;   ESOPHAGOGASTRODUODENOSCOPY  08/06/2011   Procedure: ESOPHAGOGASTRODUODENOSCOPY (EGD);  Surgeon: Malissa Hippo, MD;  [facility];  Service: Endoscopy;  Laterality: N/A;   ESOPHAGOGASTRODUODENOSCOPY N/A  05/10/2019   Procedure: ESOPHAGOGASTRODUODENOSCOPY (EGD);  Surgeon: Malissa Hippo, MD;  [facility];  Service: Endoscopy;  Laterality: N/A;   ESOPHAGOGASTRODUODENOSCOPY (EGD) WITH PROPOFOL N/A 04/28/2022   Procedure: ESOPHAGOGASTRODUODENOSCOPY (EGD) WITH PROPOFOL;  Surgeon: Dolores Frame, MD;  [facility];  Service: Gastroenterology;  Laterality: N/A;  10:30 am, pt can't move up due to transportation   FLEXIBLE SIGMOIDOSCOPY  08/06/2011   Procedure: FLEXIBLE SIGMOIDOSCOPY;  Surgeon: Malissa Hippo, MD;  [facility];  Service: Endoscopy;  Laterality: N/A;   HEMOSTASIS CLIP PLACEMENT  09/01/2022   Procedure: HEMOSTASIS CLIP PLACEMENT;  Surgeon: Dolores Frame, MD;  [facility];  Service: Gastroenterology;;   INGUINAL HERNIA REPAIR Left 1970s   IR ANGIO INTRA EXTRACRAN SEL COM CAROTID INNOMINATE BILAT MOD SED  11/01/2020   IR ANGIO VERTEBRAL SEL VERTEBRAL BILAT MOD SED  11/01/2020  IR RADIOLOGIST EVAL & MGMT  09/23/2020   IR US GUIDE VASC ACCESS RIGHT  11/01/2020   LARYNX SURGERY  1970s   Polyps excised   LEFT HEART CATH AND CORONARY ANGIOGRAPHY N/A 12/29/2019   Procedure: LEFT HEART CATH AND CORONARY ANGIOGRAPHY;  Surgeon: Corky Crafts, MD;  Location: [facility] INVASIVE CV LAB;  Service: Cardiovascular;  Laterality: N/A;   LEFT HEART CATH AND CORONARY ANGIOGRAPHY N/A 10/12/2022   Procedure: LEFT HEART CATH AND CORONARY ANGIOGRAPHY;  Surgeon: Corky Crafts, MD;  Location: [facility] INVASIVE CV LAB;  Service: Cardiovascular;  Laterality: N/A;   MALONEY DILATION  05/10/2019   Procedure: [name] DILATION;  Surgeon: Malissa Hippo, MD;  [facility];  Service: Endoscopy;;   MELANOMA EXCISION Left ~ 2016   forearm   POLYPECTOMY  05/10/2019   Procedure: POLYPECTOMY;  Surgeon: Malissa Hippo, MD;  [facility];  Service: Endoscopy;;  colon   POLYPECTOMY  09/01/2022   Procedure: POLYPECTOMY  INTESTINAL;  Surgeon: Dolores Frame, MD;  [facility];  Service: Gastroenterology;;   SPLENECTOMY, 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   spontaneous rupture   SPLENECTOMY, TOTAL     SUBMUCOSAL LIFTING INJECTION  09/01/2022   Procedure: SUBMUCOSAL LIFTING INJECTION;  Surgeon: Dolores Frame, MD;  [facility];  Service: Gastroenterology;;   SUBMUCOSAL TATTOO INJECTION  09/01/2022   Procedure: SUBMUCOSAL TATTOO INJECTION;  Surgeon: Dolores Frame, MD;  [facility];  Service: Gastroenterology;;   TEMPORARY PACEMAKER N/A 12/29/2019   Procedure: TEMPORARY PACEMAKER;  Surgeon: Corky Crafts, MD;  Location: [facility] INVASIVE CV LAB;  Service: Cardiovascular;  Laterality: N/A;   TRANSURETHRAL RESECTION OF BLADDER TUMOR N/A 03/15/2023   Procedure: TRANSURETHRAL RESECTION OF BLADDER TUMOR (TURBT)with Gemcitibane;  Surgeon: Malen Gauze, MD;  [facility];  Service: Urology;  Laterality: N/A;   TRANSURETHRAL RESECTION OF BLADDER TUMOR N/A 04/29/2023   Procedure: TRANSURETHRAL RESECTION OF BLADDER TUMOR (TURBT);  Surgeon: Malen Gauze, MD;  Location: [facility];  Service: Urology;  Laterality: N/A;   Family History  Problem Relation Age of Onset   Heart failure Mother    Cancer Father    Heart failure Father    Cancer Sister    Dementia Sister    Heart disease Other    Arthritis Other    Cancer Other    Diabetes Other    Kidney disease Other    Cancer Sister    Heart failure Brother    Social History   Socioeconomic History   Marital status: Divorced    Spouse name: Not on file   Number of children: 2   Years of education: Not on file   Highest education level: Not on file  Occupational History   Occupation: Disabled   Occupation: retired  Tobacco Use   Smoking status: Some Days    Current packs/day: 0.50    Average packs/day: 0.5 packs/day for 40.0 years (20.0 ttl pk-yrs)    Types: Cigarettes    Passive exposure: Current    Smokeless tobacco: Never   Tobacco comments:    Trying to quit with use of Chantix and calm as of 04/13/2023 but restarted in February with 2 cigarettes per week.   Vaping Use   Vaping status: Never Used  Substance and Sexual Activity   Alcohol use: Not Currently   Drug use: Never   Sexual activity: Not Currently  Other Topics Concern   Not on file  Social History  Narrative   ** Merged History Encounter **       Right handed   Wears glasses    Drinks 1 cup of coffee daily   Drinks sweet tea twice a week.   Social Drivers of Corporate investment banker Strain: Low Risk  (12/15/2022)   Overall Financial Resource Strain (CARDIA)    Difficulty of Paying Living Expenses: Not hard at all  Food Insecurity: No Food Insecurity (12/15/2022)   Hunger Vital Sign    Worried About Running Out of Food in the Last Year: Never true    Ran Out of Food in the Last Year: Never true  Transportation Needs: No Transportation Needs (12/15/2022)   PRAPARE - Administrator, Civil Service (Medical): No    Lack of Transportation (Non-Medical): No  Physical Activity: Sufficiently Active (12/15/2022)   Exercise Vital Sign    Days of Exercise per Week: 7 days    Minutes of Exercise per Session: 30 min  Stress: Stress Concern Present (12/15/2022)   [employer] of Occupational Health - Occupational Stress Questionnaire    Feeling of Stress : To some extent  Social Connections: Moderately Isolated (12/15/2022)   Social Connection and Isolation Panel [NHANES]    Frequency of Communication with Friends and Family: More than three times a week    Frequency of Social Gatherings with Friends and Family: More than three times a week    Attends Religious Services: More than 4 times per year    Active Member of [employer] or Organizations: No    Attends Banker Meetings: Never    Marital Status: Divorced  Catering manager Violence: Not At Risk (12/15/2022)   Humiliation, Afraid, Rape, and  Kick questionnaire    Fear of Current or Ex-Partner: No    Emotionally Abused: No    Physically Abused: No    Sexually Abused: No    Review of Systems Constitutional: Patient denies any unintentional weight loss or change in strength lntegumentary: Patient denies any rashes or pruritus Cardiovascular: Patient denies chest pain or syncope Respiratory: Patient denies shortness of breath Musculoskeletal: Patient denies muscle cramps or weakness Neurologic: Patient denies convulsions or seizures  GU: As per HPI.  OBJECTIVE Vitals:   05/27/23 1325  BP: (!) 170/82  Pulse: 88  Temp: 98 F (36.7 C)   There is no height or weight on file to calculate BMI.  Physical Examination Constitutional: No obvious distress; patient is non-toxic appearing  Cardiovascular: No visible lower extremity edema.  Respiratory: The patient does not have audible wheezing/stridor; respirations do not appear labored  Gastrointestinal: Abdomen non-distended Musculoskeletal: Normal ROM of UEs  Skin: No obvious rashes/open sores  Neurologic: CN 2-12 grossly intact Psychiatric: Answered questions appropriately with normal affect  Hematologic/Lymphatic/Immunologic: No obvious bruises or sites of spontaneous bleeding  UA: no evidence of UTI  ASSESSMENT Malignant neoplasm of lateral wall of urinary bladder (HCC) - Plan: bcg vaccine injection 81 mg, In and Out Cath, Urinalysis, Routine w reflex microscopic  We reviewed her pathology results from 04/29/2023.  She underwent BCG instillation #1 of 6 today; tolerated well.  Patient remained in clinic for 30 minutes following instillation today for monitoring of hypersensitivity reaction; none noted.   Post-instillation instructions were reviewed; written instructions provided.  Next surveillance cystoscopy is scheduled on 11/03/2023 with Dr. Ronne Binning.  Will plan for follow up in one week for next BCG instillation. Patient verbalized understanding of and  agreement with current plan. All questions were answered.  PLAN Advised the following: 1. Return in about 1 week (around 06/03/2023) for f/u with BCG instillation.  Orders Placed This Encounter  Procedures   Urinalysis, Routine w reflex microscopic   In and Out Cath    It has been explained that the patient is to follow regularly with their PCP in addition to all other providers involved in their care and to follow instructions provided by these respective offices. Patient advised to contact urology clinic if any urologic-pertaining questions, concerns, new symptoms or problems arise in the interim period.  Patient Instructions    Patient Education: Bacillus Calmette-Guerin (BCG) Bladder Instillations for Bladder Cancer  What it is: BCG is a vaccine which is used to prevent tuberculosis (TB).  But it's also a helpful treatment for some early bladder cancers.  When BCG goes directly into the bladder the treatment is described as intravesical. BCG is a type of immunotherapy.  Immunotherapy stimulates the body's immune system to destroy cancer cells.  BCG treatment is administered in an outpatient setting.  It takes a few minutes to administer and you can go home as soon as it's finished.  It might be a good idea to ask someone to bring you, particularly the fist time.  Before Instillation Please be on time for your instillation treatment and prepared to provide a urine specimen to check for infection prior to treatment. Your treatment plan will be modified if you are unwell or have an infection in your urine on a scheduled BCG instillation day. Inform your doctor if you have felt feverish, tired or had chills since your last treatment or if you have been urinating any bright red blood before your instillation. Empty your bladder just before the instillation and give urine specimen to the nurse to check.  During Instillation The liquid medication will be instilled into your bladder through  a catheter (a small tube). In most cases, the catheter will be removed from the bladder after the instillation is completed. The medication should be retained (held) in your bladder for 2 hours to obtain the best results.  After Instillation  Immediately after:  - Go straight home after procedure to avoid possibly having to use a public restroom.  - Hold your urine for two hours after the BCG instillation. You should change positions several times once you get home. Ideally, you should reposition from left side to right side and also lie upon your back and your abdomen. Change these positions every 15 minutes to maximize bladder exposure to the medication for one hour. This can seem strange or difficult, however it is necessary to give the treatment adequate time and contact with all bladder surfaces to work effectively.  After 2 hours:  - Use the toilet to urinate and empty your bladder as completely as possible. - Avoid urine splashing on the toilet seat or getting on your hands. It might be easer for men to sit down if they typically stand when urinating at an ordinary toilet. - After urinating, pour two (2) cups of household bleach (Clorox or equivalent) into the toilet and let sit for 15-20 minutes before flushing. - Wash your hands and genital area thoroughly after you urinate. - Drink plenty of fluids after your instillation to flush your bladder.  Repeat the above process each time you urinate for six (6) hours after each BCG treatment. This is necessary because BCG is a live vaccine and other people shouldn't be exposed to it.  For 2 days (48 hours) after treatment:  A condom should be used on males during sex for the first 48 hours after they or their partner under BCG treatment to prevent unintentional contact with any BCG which may be present in the patient's semen or vaginal fluid.  Side effects (common): - Needing to pass urine often - Pain when you pass urine - Blood in  urine - Flu-like symptoms (tiredness, general aching and raised temperature)  These side effects should improve within 1-2 days. Drinking lots of water can help flush the drug out of your bladder and reduce some of these effects. Taking Ibuprofen or Aleve is encouraged unless you have a condition that would make these medications unsafe to take (such as kidney disease, diabetes, etc.).  Side effects which warrant urgent evaluation - Call the Urology office or go to ER if the following occur: New onset of cough, chills, night sweats, fever over 100.19F, joint pain Heavy persistent blood in the urine (not improving over time) Severe bladder spasms Inability to urinate  Follow up plan: Bladder cancer has a strong tendency to come back throughout your lifetime. It is extremely important that you keep your BCG appointment and have lifelong follow up with your urologist to look in the bladder periodically and check the urine for cancer cells.  Contraception / Water quality scientist / Unintended pregnancy prevention: It is unknown if or how BCG may affect a developing fetus so it is not advisable to become pregnant or to father a child while undergoing BCG instillations.  It is important to use effective contraception during BCG treatment and for six weeks afterwards.   [facility] [place] [address] [place], [place] [address] Phone: [phone]   Electronically signed by:  Donnita Falls, FNP   05/27/23    2:00 PM

## 2023-05-27 NOTE — Patient Instructions (Signed)
 Patient Education: Bacillus Calmette-Guerin (BCG) Bladder Instillations for Bladder Cancer  What it is: BCG is a vaccine which is used to prevent tuberculosis (TB).  But it's also a helpful treatment for some early bladder cancers.  When BCG goes directly into the bladder the treatment is described as intravesical. BCG is a type of immunotherapy.  Immunotherapy stimulates the body's immune system to destroy cancer cells.  BCG treatment is administered in an outpatient setting.  It takes a few minutes to administer and you can go home as soon as it's finished.  It might be a good idea to ask someone to bring you, particularly the fist time.  Before Instillation Please be on time for your instillation treatment and prepared to provide a urine specimen to check for infection prior to treatment. Your treatment plan will be modified if you are unwell or have an infection in your urine on a scheduled BCG instillation day. Inform your doctor if you have felt feverish, tired or had chills since your last treatment or if you have been urinating any bright red blood before your instillation. Empty your bladder just before the instillation and give urine specimen to the nurse to check.  During Instillation The liquid medication will be instilled into your bladder through a catheter (a small tube). In most cases, the catheter will be removed from the bladder after the instillation is completed. The medication should be retained (held) in your bladder for 2 hours to obtain the best results.  After Instillation  Immediately after:  - Go straight home after procedure to avoid possibly having to use a public restroom.  - Hold your urine for two hours after the BCG instillation. You should change positions several times once you get home. Ideally, you should reposition from left side to right side and also lie upon your back and your abdomen. Change these positions every 15 minutes to maximize bladder  exposure to the medication for one hour. This can seem strange or difficult, however it is necessary to give the treatment adequate time and contact with all bladder surfaces to work effectively.  After 2 hours:  - Use the toilet to urinate and empty your bladder as completely as possible. - Avoid urine splashing on the toilet seat or getting on your hands. It might be easer for men to sit down if they typically stand when urinating at an ordinary toilet. - After urinating, pour two (2) cups of household bleach (Clorox or equivalent) into the toilet and let sit for 15-20 minutes before flushing. - Wash your hands and genital area thoroughly after you urinate. - Drink plenty of fluids after your instillation to flush your bladder.  Repeat the above process each time you urinate for six (6) hours after each BCG treatment. This is necessary because BCG is a live vaccine and other people shouldn't be exposed to it.  For 2 days (48 hours) after treatment: A condom should be used on males during sex for the first 48 hours after they or their partner under BCG treatment to prevent unintentional contact with any BCG which may be present in the patient's semen or vaginal fluid.  Side effects (common): - Needing to pass urine often - Pain when you pass urine - Blood in urine - Flu-like symptoms (tiredness, general aching and raised temperature)  These side effects should improve within 1-2 days. Drinking lots of water can help flush the drug out of your bladder and reduce some of these effects. Taking  Ibuprofen or Aleve is encouraged unless you have a condition that would make these medications unsafe to take (such as kidney disease, diabetes, etc.).  Side effects which warrant urgent evaluation - Call the Urology office or go to ER if the following occur: New onset of cough, chills, night sweats, fever over 100.64F, joint pain Heavy persistent blood in the urine (not improving over time) Severe  bladder spasms Inability to urinate  Follow up plan: Bladder cancer has a strong tendency to come back throughout your lifetime. It is extremely important that you keep your BCG appointment and have lifelong follow up with your urologist to look in the bladder periodically and check the urine for cancer cells.  Contraception / Water quality scientist / Unintended pregnancy prevention: It is unknown if or how BCG may affect a developing fetus so it is not advisable to become pregnant or to father a child while undergoing BCG instillations.  It is important to use effective contraception during BCG treatment and for six weeks afterwards.   [facility]  [address] [place], [place] [address] Phone: [phone]

## 2023-05-28 LAB — SURGICAL PATHOLOGY

## 2023-05-31 ENCOUNTER — Encounter: Payer: 59 | Attending: Physical Medicine & Rehabilitation | Admitting: Physical Medicine & Rehabilitation

## 2023-05-31 ENCOUNTER — Encounter: Payer: Self-pay | Admitting: Physical Medicine & Rehabilitation

## 2023-05-31 VITALS — BP 134/81 | HR 81 | Ht 63.0 in | Wt 179.8 lb

## 2023-05-31 DIAGNOSIS — Z79891 Long term (current) use of opiate analgesic: Secondary | ICD-10-CM | POA: Insufficient documentation

## 2023-05-31 DIAGNOSIS — F331 Major depressive disorder, recurrent, moderate: Secondary | ICD-10-CM | POA: Diagnosis present

## 2023-05-31 DIAGNOSIS — G8929 Other chronic pain: Secondary | ICD-10-CM | POA: Diagnosis present

## 2023-05-31 DIAGNOSIS — M79645 Pain in left finger(s): Secondary | ICD-10-CM | POA: Insufficient documentation

## 2023-05-31 DIAGNOSIS — M542 Cervicalgia: Secondary | ICD-10-CM | POA: Diagnosis not present

## 2023-05-31 DIAGNOSIS — G894 Chronic pain syndrome: Secondary | ICD-10-CM | POA: Diagnosis present

## 2023-05-31 DIAGNOSIS — M2550 Pain in unspecified joint: Secondary | ICD-10-CM | POA: Diagnosis present

## 2023-05-31 DIAGNOSIS — Z5181 Encounter for therapeutic drug level monitoring: Secondary | ICD-10-CM | POA: Diagnosis not present

## 2023-05-31 MED ORDER — HYDROCODONE-ACETAMINOPHEN 10-325 MG PO TABS: 1.0000 | ORAL_TABLET | Freq: Four times a day (QID) | ORAL | 0 refills | Status: DC | PRN

## 2023-05-31 NOTE — Progress Notes (Signed)
 Subjective:    Patient ID: [name], female    DOB: [birth year], 73 y.o.   MRN: [id number]  HPI    HPI 01/01/23   [name] is a 73 y.o. year old female  who  has a past medical history of Anxiety, Arthritis, Bradycardia, Breast cancer, left breast (HCC) (1970s; 2015), CAD (coronary artery disease), Cancer of skin of leg, 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HCC), Chronic lower back pain, Chronic pain, COPD (chronic obstructive pulmonary disease) (HCC), Coronary artery disease, Depression, GERD (gastroesophageal reflux disease), Heart murmur, Hepatic cirrhosis (HCC) (12/20/2017), Hepatitis, History of blood transfusion, History of hiatal hernia, History of kidney stones, Hyperlipidemia, Hypertension, Hypothyroidism, Kidney stone, Melanoma of forearm, left (HCC), Migraine, Obesity, Stroke (HCC) (04/2003), Tobacco use disorder, and Wears glasses.   They are presenting to PM&R clinic as a new patient for pain management evaluation. They were referred by [name] for treatment of chronic pain.  Patient reports she has had pain in multiple areas for many years.  Reports she had pain in her neck due to arthritis for many years.  Reports she had previous injections in her neck ESI?  X 3 about 10 years ago that did not significantly reduce her overall pain but did help the burning pain she was having in the area.  She said she was seeing a "bone doctor" and [name].  She reports she previously had cortisone injections in her shoulders more on the left side.  Shoulder pain is doing better recently.  She reports severe swelling and pain around her first [name] joint.  She also has pain throughout other digits of her bilateral hands due to arthritis.  Additionally she has pain in her knees primarily on the left side.  This pain is worse with ambulation.  For several months she has been having pain around her left jaw.  Reports some shock type pain over her left face.  She has a lot of popping when she  opens and closes her jaw.  Recently had MRI of her face ordered by neurology, results pending.  She also had an MRI of her brain with results pending by neurology due to left-sided weakness rule out additional CVA.   He has a history of depression and anxiety.  Denies HI or SI.  Reports that she lost her son, dog and niece about 1 and half years ago.   Red flag symptoms: No red flags for back pain endorsed in Hx or ROS   Medications tried: Topical medications - denies  Nsaids - Excedrin used in the past Tylenol  Helps for a short time  Opiates   Hydrocodone 10mg  for [employer], retired in July , now [name] Gabapentin- Allergy to this medication TCAs  nortriptyline - helping jaw pain  SNRIs  Duloxetine?- not sure      Other treatments: PT-  PT for TMJ jaw, years ago it made the pain worse    TENs unit - denies  Injections- Shoulder injections Surgery - denies  Other  heating pad helps  Interval History 02/01/23  Patient is here for follow-up regarding her chronic neck pain, left-sided facial pain, polyarthralgia.  She had an MRI face completed in October which showed left TMJ degeneration.  Discontinued nortriptyline because she did not feel like it was helping.  She continues to have left facial pain however it is doing better than it was during her past visit.  She continues to have a lot of pain in her neck and  base of her left thumb.  He also has pain in her left knee and left shoulder.  She continues to use hydrocodone to manage her pain, she has used this for years.  She is not have any side effects with the medication.  She reports poor mood, did not tolerate sertraline because she felt it made her mood worse.  She has discontinued this medication.  She reports that she has follow-up with psychiatry scheduled.  Interval history 04/01/2023 Patient is here for follow-up regarding her chronic neck, lower back, left-sided facial pain and polyarthralgia. Worst pain is in her  lower back and neck.   Since her last visit she has had treatment for bladder cancer. She was ordered oxycodone after the procedure, however only used half a pill.  She has continued using her hydrocodone 10 mg which has been helping to manage her pain.  She does feel that the hydrocodone she recently picked up from the pharmacy has a different pill shape than previously.  She feels like this version does not work as well as the prior version.  She denies any side effects to the medication.  She reports she is following up with psychiatry regarding her mood.  Denies SI or HI.  Interval history 05/31/2023 Ms. Berte is here regarding her chronic pain.  Chronic neck, lower back, facial pain and pain in multiple joints is largely unchanged from prior visit. She is also having some suprapubic pain related to bladder cancer.  She is currently being treated for her bladder cancer.  She had TURBT by Dr. 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  She is having treatments with BCG bladder instillation. Reports Norco 10 is helping keep her pain under control.  She had been ordered oxycodone after procedure says she did not use this.  Pain Inventory Average Pain 10 Pain Right Now 9 My pain is constant, sharp, burning, and aching  In the last 24 hours, has pain interfered with the following? General activity 5 Relation with others 9 Enjoyment of life 8 What TIME of day is your pain at its worst? evening and night Sleep (in general) Poor  Pain is worse with: walking, bending, sitting, standing, and some activites Pain improves with: rest, heat/ice, pacing activities, and medication Relief from Meds: 10  Family History  Problem Relation Age of Onset   Heart failure Mother    Cancer Father    Heart failure Father    Cancer Sister    Dementia Sister    Heart disease Other    Arthritis Other    Cancer Other    Diabetes Other    Kidney disease Other     Cancer Sister    Heart failure Brother    Social History   Socioeconomic History   Marital status: Divorced    Spouse name: Not on file   Number of children: 2   Years of education: Not on file   Highest education level: Not on file  Occupational History   Occupation: Disabled   Occupation: retired  Tobacco Use   Smoking status: Some Days    Current packs/day: 0.50    Average packs/day: 0.5 packs/day for 40.0 years (20.0 ttl pk-yrs)    Types: Cigarettes    Passive exposure: Current   Smokeless tobacco: Never   Tobacco comments:    Trying to quit with use of Chantix and calm as of 04/13/2023 but restarted in February with 2 cigarettes per week.   Vaping  Use   Vaping status: Never Used  Substance and Sexual Activity   Alcohol use: Not Currently   Drug use: Never   Sexual activity: Not Currently  Other Topics Concern   Not on file  Social History Narrative   ** Merged History Encounter **       Right handed   Wears glasses    Drinks 1 cup of coffee daily   Drinks sweet tea twice a week.   Social Drivers of Corporate investment banker Strain: Low Risk  (12/15/2022)   Overall Financial Resource Strain (CARDIA)    Difficulty of Paying Living Expenses: Not hard at all  Food Insecurity: No Food Insecurity (12/15/2022)   Hunger Vital Sign    Worried About Running Out of Food in the Last Year: Never true    Ran Out of Food in the Last Year: Never true  Transportation Needs: No Transportation Needs (12/15/2022)   PRAPARE - Administrator, Civil Service (Medical): No    Lack of Transportation (Non-Medical): No  Physical Activity: Sufficiently Active (12/15/2022)   Exercise Vital Sign    Days of Exercise per Week: 7 days    Minutes of Exercise per Session: 30 min  Stress: Stress Concern Present (12/15/2022)   [employer] of Occupational Health - Occupational Stress Questionnaire    Feeling of Stress : To some extent  Social Connections: Moderately Isolated  (12/15/2022)   Social Connection and Isolation Panel [NHANES]    Frequency of Communication with Friends and Family: More than three times a week    Frequency of Social Gatherings with Friends and Family: More than three times a week    Attends Religious Services: More than 4 times per year    Active Member of Clubs or Organizations: No    Attends Banker Meetings: Never    Marital Status: Divorced   Past Surgical History:  Procedure Laterality Date   ABDOMINAL HYSTERECTOMY  1978   APPENDECTOMY     BIOPSY  04/28/2022   Procedure: BIOPSY;  Surgeon: Dolores Frame, MD;  [facility];  Service: Gastroenterology;;   BLADDER INSTILLATION N/A 04/29/2023   Procedure: BLADDER INSTILLATION- gemcitabine;  Surgeon: Malen Gauze, MD;  [facility]: [facility];  Service: Urology;  Laterality: N/A;   BREAST BIOPSY Left 1970s; 2015   BREAST LUMPECTOMY Left 1970s   BREAST LUMPECTOMY WITH NEEDLE LOCALIZATION Left 07/07/2013   Procedure: BREAST LUMPECTOMY WITH NEEDLE LOCALIZATION;  Surgeon: Robyne Askew, MD;  Location: [facility];  Service: General;  Laterality: Left;   CARDIAC CATHETERIZATION N/A 01/22/2016   Procedure: Left Heart Cath and Coronary Angiography;  Surgeon: Peter M Swaziland, MD;  Location: [facility] INVAS[facility] CV LAB;  Service: Cardiovascular;  Laterality: N/A;   CARDIAC CATHETERIZATION N/A 01/22/2016   Procedure: Coronary Stent Intervention;  Surgeon: Peter M Swaziland, MD;  Location: [facility] INVASIVE CV LAB;  Service: Cardiovascular;  Laterality: N/A;   CAROTID STENT Left 04/2003   small left parietal and left hemispheric CVA/notes 07/29/2010   CHOLECYSTECTOMY OPEN     COLONOSCOPY  2011   COLONOSCOPY N/A 05/10/2019   Procedure: COLONOSCOPY;  Surgeon: Malissa Hippo, MD;  [facility];  Service: Endoscopy;  Laterality: N/A;  1:45   COLONOSCOPY WITH PROPOFOL N/A 09/01/2022   Procedure: COLONOSCOPY WITH PROPOFOL;  Surgeon: Dolores Frame, MD;  [facility];  Service: Gastroenterology;  Laterality: N/A;  1:45PM;ASA 3   CORONARY ATHERECTOMY N/A  12/29/2019   Procedure: CORONARY ATHERECTOMY;  Surgeon: Corky Crafts, MD;  Location: [facility] INVASIVE CV LAB;  Service: Cardiovascular;  Laterality: N/A;  Prox RCA   CORONARY STENT INTERVENTION N/A 12/29/2019   Procedure: CORONARY STENT INTERVENTION;  Surgeon: Corky Crafts, MD;  Location: [facility] INVASIVE CV LAB;  Service: Cardiovascular;  Laterality: N/A;  Prox RCA   CORONARY ULTRASOUND/IVUS N/A 12/29/2019   Procedure: Intravascular Ultrasound/IVUS;  Surgeon: Corky Crafts, MD;  Location: [name] INVASIVE CV LAB;  Service: Cardiovascular;  Laterality: N/A;   CYSTOSCOPY WITH BIOPSY N/A 04/29/2023   Procedure: CYSTOSCOPY WITH BIOPSY;  Surgeon: Malen Gauze, MD;  [facility];  Service: Urology;  Laterality: N/A;   ESOPHAGEAL DILATION N/A 04/28/2022   Procedure: ESOPHAGEAL DILATION;  Surgeon: Dolores Frame, MD;  [facility];  Service: Gastroenterology;  Laterality: N/A;   ESOPHAGOGASTRODUODENOSCOPY  08/06/2011   Procedure: ESOPHAGOGASTRODUODENOSCOPY (EGD);  Surgeon: Malissa Hippo, MD;  [facility];  Service: Endoscopy;  Laterality: N/A;   ESOPHAGOGASTRODUODENOSCOPY N/A 05/10/2019   Procedure: ESOPHAGOGASTRODUODENOSCOPY (EGD);  Surgeon: Malissa Hippo, MD;  [facility];  Service: Endoscopy;  Laterality: N/A;   ESOPHAGOGASTRODUODENOSCOPY (EGD) WITH PROPOFOL N/A 04/28/2022   Procedure: ESOPHAGOGASTRODUODENOSCOPY (EGD) WITH PROPOFOL;  Surgeon: Dolores Frame, MD;  [facility];  Service: Gastroenterology;  Laterality: N/A;  10:30 am, pt can't move up due to transportation   FLEXIBLE SIGMOIDOSCOPY  08/06/2011   Procedure: FLEXIBLE SIGMOIDOSCOPY;  Surgeon: Malissa Hippo, MD;  [facility];  Service: Endoscopy;  Laterality: N/A;   HEMOSTASIS CLIP PLACEMENT  09/01/2022    Procedure: HEMOSTASIS CLIP PLACEMENT;  Surgeon: Dolores Frame, MD;  [facility];  Service: Gastroenterology;;   INGUINAL HERNIA REPAIR Left 1970s   IR ANGIO INTRA EXTRACRAN SEL COM CAROTID INNOMINATE BILAT MOD SED  11/01/2020   IR ANGIO VERTEBRAL SEL VERTEBRAL BILAT MOD SED  11/01/2020   IR RADIOLOGIST EVAL & MGMT  09/23/2020   IR US GUIDE VASC ACCESS RIGHT  11/01/2020   LARYNX SURGERY  1970s   Polyps excised   LEFT HEART CATH AND CORONARY ANGIOGRAPHY N/A 12/29/2019   Procedure: LEFT HEART CATH AND CORONARY ANGIOGRAPHY;  Surgeon: Corky Crafts, MD;  Location: [facility] CV LAB;  Service: Cardiovascular;  Laterality: N/A;   LEFT HEART CATH AND CORONARY ANGIOGRAPHY N/A 10/12/2022   Procedure: LEFT HEART CATH AND CORONARY ANGIOGRAPHY;  Surgeon: Corky Crafts, MD;  Location: [facility] INVASIVE CV LAB;  Service: Cardiovascular;  Laterality: N/A;   MALONEY DILATION  05/10/2019   Procedure: [name] DILATION;  Surgeon: Malissa Hippo, MD;  [facility];  Service: Endoscopy;;   MELANOMA EXCISION Left ~ 2016   forearm   POLYPECTOMY  05/10/2019   Procedure: POLYPECTOMY;  Surgeon: Malissa Hippo, MD;  [facility];  Service: Endoscopy;;  colon   POLYPECTOMY  09/01/2022   Procedure: POLYPECTOMY INTESTINAL;  Surgeon: Dolores Frame, MD;  [facility];  Service: Gastroenterology;;   SPLENECTOMY, 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   spontaneous rupture   SPLENECTOMY, TOTAL     SUBMUCOSAL LIFTING INJECTION  09/01/2022   Procedure: SUBMUCOSAL LIFTING INJECTION;  Surgeon: Dolores Frame, MD;  [facility];  Service: Gastroenterology;;   SUBMUCOSAL TATTOO INJECTION  09/01/2022   Procedure: SUBMUCOSAL TATTOO INJECTION;  Surgeon: Dolores Frame, MD;  [facility];  Service: Gastroenterology;;   TEMPORARY PACEMAKER N/A 12/29/2019   Procedure: TEMPORARY  PACEMAKER;  Surgeon: Corky Crafts, MD;  Location:  [facility] INVASIVE CV LAB;  Service: Cardiovascular;  Laterality: N/A;   TRANSURETHRAL RESECTION OF BLADDER TUMOR N/A 03/15/2023   Procedure: TRANSURETHRAL RESECTION OF BLADDER TUMOR (TURBT)with Gemcitibane;  Surgeon: Malen Gauze, MD;  [facility];  Service: Urology;  Laterality: N/A;   TRANSURETHRAL RESECTION OF BLADDER TUMOR N/A 04/29/2023   Procedure: TRANSURETHRAL RESECTION OF BLADDER TUMOR (TURBT);  Surgeon: Malen Gauze, MD;  [facility];  Service: Urology;  Laterality: N/A;   Past Surgical History:  Procedure Laterality Date   ABDOMINAL HYSTERECTOMY  1978   APPENDECTOMY     BIOPSY  04/28/2022   Procedure: BIOPSY;  Surgeon: Dolores Frame, MD;  [facility];  Service: Gastroenterology;;   BLADDER INSTILLATION N/A 04/29/2023   Procedure: BLADDER INSTILLATION- gemcitabine;  Surgeon: Malen Gauze, MD;  [facility];  Service: Urology;  Laterality: N/A;   BREAST BIOPSY Left 1970s; 2015   BREAST LUMPECTOMY Left 1970s   BREAST LUMPECTOMY WITH NEEDLE LOCALIZATION Left 07/07/2013   Procedure: BREAST LUMPECTOMY WITH NEEDLE LOCALIZATION;  Surgeon: Robyne Askew, MD;  Location: [facility];  Service: General;  Laterality: Left;   CARDIAC CATHETERIZATION N/A 01/22/2016   Procedure: Left Heart Cath and Coronary Angiography;  Surgeon: Peter M Swaziland, MD;  Location: [facility] INVASIVE CV LAB;  Service: Cardiovascular;  Laterality: N/A;   CARDIAC CATHETERIZATION N/A 01/22/2016   Procedure: Coronary Stent Intervention;  Surgeon: Peter M Swaziland, MD;  Location: [facility] INVASIVE CV LAB;  Service: Cardiovascular;  Laterality: N/A;   CAROTID STENT Left 04/2003   small left parietal and left hemispheric CVA/notes 07/29/2010   CHOLECYSTECTOMY OPEN     COLONOSCOPY  2011   COLONOSCOPY N/A 05/10/2019   Procedure: COLONOSCOPY;  Surgeon: Malissa Hippo, MD;  [facility];  Service: Endoscopy;  Laterality: N/A;  1:45   COLONOSCOPY WITH PROPOFOL N/A  09/01/2022   Procedure: COLONOSCOPY WITH PROPOFOL;  Surgeon: Dolores Frame, MD;  [facility];  Service: Gastroenterology;  Laterality: N/A;  1:45PM;ASA 3   CORONARY ATHERECTOMY N/A 12/29/2019   Procedure: CORONARY ATHERECTOMY;  Surgeon: Corky Crafts, MD;  Location: [facility] INVASIVE CV LAB;  Service: Cardiovascular;  Laterality: N/A;  Prox RCA   CORONARY STENT INTERVENTION N/A 12/29/2019   Procedure: CORONARY STENT INTERVENTION;  Surgeon: Corky Crafts, MD;  Location: [facility] INVASIVE CV LAB;  Service: Cardiovascular;  Laterality: N/A;  Prox RCA   CORONARY ULTRASOUND/IVUS N/A 12/29/2019   Procedure: Intravascular Ultrasound/IVUS;  Surgeon: Corky Crafts, MD;  Location: [facility] INVASIVE CV LAB;  Service: Cardiovascular;  Laterality: N/A;   CYSTOSCOPY WITH BIOPSY N/A 04/29/2023   Procedure: CYSTOSCOPY WITH BIOPSY;  Surgeon: Malen Gauze, MD;  [facility];  Service: Urology;  Laterality: N/A;   ESOPHAGEAL DILATION N/A 04/28/2022   Procedure: ESOPHAGEAL DILATION;  Surgeon: Dolores Frame, MD;  [facility];  Service: Gastroenterology;  Laterality: N/A;   ESOPHAGOGASTRODUODENOSCOPY  08/06/2011   Procedure: ESOPHAGOGASTRODUODENOSCOPY (EGD);  Surgeon: Malissa Hippo, MD;  [facility];  Service: Endoscopy;  Laterality: N/A;   ESOPHAGOGASTRODUODENOSCOPY N/A 05/10/2019   Procedure: ESOPHAGOGASTRODUODENOSCOPY (EGD);  Surgeon: Malissa Hippo, MD;  [facility];  Service: Endoscopy;  Laterality: N/A;   ESOPHAGOGASTRODUODENOSCOPY (EGD) WITH PROPOFOL N/A 04/28/2022   Procedure: ESOPHAGOGASTRODUODENOSCOPY (EGD) WITH PROPOFOL;  Surgeon: Dolores Frame, MD;  [facility];  Service: Gastroenterology;  Laterality: N/A;  10:30 am, pt can't move up due to transportation   FLEXIBLE SIGMOIDOSCOPY  08/06/2011   Procedure: FLEXIBLE SIGMOIDOSCOPY;  Surgeon: Malissa Hippo, MD;  [facility];  Service:  Endoscopy;  Laterality: N/A;   HEMOSTASIS CLIP PLACEMENT  09/01/2022   Procedure: HEMOSTASIS CLIP PLACEMENT;  Surgeon: Dolores Frame, MD;  [facility];  Service: Gastroenterology;;   INGUINAL HERNIA REPAIR Left 1970s   IR ANGIO INTRA EXTRACRAN SEL COM CAROTID INNOMINATE BILAT MOD SED  11/01/2020   IR ANGIO VERTEBRAL SEL VERTEBRAL BILAT MOD SED  11/01/2020   IR RADIOLOGIST EVAL & MGMT  09/23/2020   IR US GUIDE VASC ACCESS RIGHT  11/01/2020   LARYNX SURGERY  1970s   Polyps excised   LEFT HEART CATH AND CORONARY ANGIOGRAPHY N/A 12/29/2019   Procedure: LEFT HEART CATH AND CORONARY ANGIOGRAPHY;  Surgeon: Corky Crafts, MD;  Location: [facility];  Service: Cardiovascular;  Laterality: N/A;   LEFT HEART CATH AND CORONARY ANGIOGRAPHY N/A 10/12/2022   Procedure: LEFT HEART CATH AND CORONARY ANGIOGRAPHY;  Surgeon: Corky Crafts, MD;  Location: [name]VASIVE CV LAB;  Service: Cardiovascular;  Laterality: N/A;   MALONEY DILATION  05/10/2019   Procedure: [name] DILATION;  Surgeon: Malissa Hippo, MD;  [facility];  Service: Endoscopy;;   MELANOMA EXCISION Left ~ 2016   forearm   POLYPECTOMY  05/10/2019   Procedure: POLYPECTOMY;  Surgeon: Malissa Hippo, MD;  [facility];  Service: Endoscopy;;  colon   POLYPECTOMY  09/01/2022   Procedure: POLYPECTOMY INTESTINAL;  Surgeon: Dolores Frame, MD;  [facility];  Service: Gastroenterology;;   SPLENECTOMY, 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   spontaneous rupture   SPLENECTOMY, TOTAL     SUBMUCOSAL LIFTING INJECTION  09/01/2022   Procedure: SUBMUCOSAL LIFTING INJECTION;  Surgeon: Dolores Frame, MD;  [facility];  Service: Gastroenterology;;   SUBMUCOSAL TATTOO INJECTION  09/01/2022   Procedure: SUBMUCOSAL TATTOO INJECTION;  Surgeon: Dolores Frame, MD;  [facility];  Service: Gastroenterology;;   TEMPORARY PACEMAKER N/A 12/29/2019    Procedure: TEMPORARY PACEMAKER;  Surgeon: Corky Crafts, MD;  Location: [facility] INVASIVE CV LAB;  Service: Cardiovascular;  Laterality: N/A;   TRANSURETHRAL RESECTION OF BLADDER TUMOR N/A 03/15/2023   Procedure: TRANSURETHRAL RESECTION OF BLADDER TUMOR (TURBT)with Gemcitibane;  Surgeon: Malen Gauze, MD;  [facility];  Service: Urology;  Laterality: N/A;   TRANSURETHRAL RESECTION OF BLADDER TUMOR N/A 04/29/2023   Procedure: TRANSURETHRAL RESECTION OF BLADDER TUMOR (TURBT);  Surgeon: Malen Gauze, MD;  [facility];  Service: Urology;  Laterality: N/A;   Past Medical History:  Diagnosis Date   Anxiety    Arthritis    "hands, arms, back, neck, knees, fingers" (01/21/2016)   Bradycardia    Breast cancer, left breast (HCC) 1970s; 2015   CAD (coronary artery disease)    a. s/p NSTEMI in 2017 with DES to OM and unsuccessful PCI to RCA with wire dissection b. DES to RCA in 2021   Cancer of skin of leg    BLE   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HCC)    left   Chronic lower back pain    Chronic pain    COPD (chronic obstructive pulmonary disease) (HCC)    Coronary artery disease    Depression    GERD (gastroesophageal reflux disease)    Heart murmur    Hepatic cirrhosis (HCC) 12/20/2017   Hepatitis  History of blood transfusion    "related to spleen"   History of hiatal hernia    History of kidney stones    Hyperlipidemia    Hypertension    Hypothyroidism    Long term prescription benzodiazepine use 12/10/2022   Melanoma of forearm, left (HCC)    Migraine    "a few migraines/year" (01/21/2016)   Obesity    Polypharmacy 12/10/2022   Stroke (HCC) 04/2003   2 carotid artery stents in place; small left parietal and left hemispheric CVA.[name] 01/21/2016   Tobacco use disorder    50 pack years; questionably discontinued in 2010   Wears glasses    BP 134/81   Pulse 81   Ht 5\' 3"  (1.6 m)   Wt 179 lb 12.8 oz (81.6 kg)   SpO2 93%   BMI 31.85 kg/m   Opioid Risk  Score:   Fall Risk Score:  `1  Depression screen [facility] 2/9     05/31/2023   10:17 AM 04/01/2023   12:48 PM 02/01/2023    9:35 AM 01/21/2023    9:33 AM 01/01/2023   11:22 AM 12/15/2022    2:52 PM 12/10/2022   12:29 PM  Depression screen PHQ 2/9  Decreased Interest 0 0 3 1 0 1   Down, Depressed, Hopeless 0 0 3 3 2 1    PHQ - 2 Score 0 0 6 4 2 2    Altered sleeping    3 2 0   Tired, decreased energy    3 3 2    Change in appetite    1 0 2   Feeling bad or failure about yourself     0 1 0   Trouble concentrating    0 0 0   Moving slowly or fidgety/restless    0 0 0   Suicidal thoughts     0 0   PHQ-9 Score    11 8 6    Difficult doing work/chores    Somewhat difficult  Somewhat difficult      Information is confidential and restricted. Go to Review Flowsheets to unlock data.     Review of Systems  Constitutional: Negative.   HENT: Negative.    Eyes: Negative.   Respiratory: Negative.    Cardiovascular: Negative.   Gastrointestinal: Negative.   Endocrine: Negative.   Genitourinary: Negative.   Musculoskeletal:  Positive for arthralgias and back pain.  Skin: Negative.   Allergic/Immunologic: Negative.   Neurological: Negative.   Hematological: Negative.   Psychiatric/Behavioral:  Positive for dysphoric mood.   All other systems reviewed and are negative.      Objective:   Physical Exam   Gen: no distress, normal appearing HEENT: oral mucosa pink and moist, NCAT Chest: normal effort, normal rate of breathing Abd: soft, non-distended Ext: no edema Psych: pleasant, normal affect Skin: intact Neuro: Alert and oriented x3, follows simple commands, fluent speech, comprehension intact Strength 5 out of 5 in right upper and right lower extremity Strength 4 out of 5 in left upper extremity and left lower extremity TTP L-spine and C-spine Left first CMC TTP She appears to occasionally push over her suprapubic region due to discomfort  Prior exam Decreased/alerted sensation L  face Facial movements intact b/l Strength 5/5 RUE and RLE Strength 4/5 LUE and LLE Musculoskeletal:  Arthritic changes in b/l hands noted L knee tenderness to palpation Mild B/L hand tenderness  L 1st CMC pain with palpation and PROM, mild swelling around this joint + L TMJ tenderness and  clicking  + TTP L spine and C spine paraspinal muscles + trapezius tenderness to palpation, Mild L shoulder tenderness Spurling's- neck pain resulted  Slump test negative    Xray R hand  2020 Right hand x-rays   Right wrist pain   No fracture dislocation or malalignment joint spaces seem preserved   Impression normal x-ray of the right hand   Xray L hand  2020  Left hand pain swelling numbness tingling   AP lateral oblique of the left hand no fracture dislocation or bony arthritis is seen.  Alignment looks normal.   Impression normal left hand x-ray   L knee Xray  12/25/21 On my read appears to show evidence  mild OA     C spine xary 2017 FINDINGS: Bones are osteopenic. Advanced spondylosis and degenerative disc disease at C5-6 with disc space loss, sclerosis and large osteophytes mild degenerative changes at other levels. Mild diffuse facet arthropathy. Facets appear aligned. Foraminal encroachment suspected of the left C5-6 foramen related to bony spurring. Trachea is midline. Odontoid is intact.   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   No acute osseous finding by plain radiography.       CT C spine 2016 IMPRESSION: 1. Cervical spine degenerative changes. 2. Straightening of the normal cervical lordosis. 3. 9 mm right lobe thyroid nodule, too small to characterize, but most likely benign in the absence of known clinical risk factors for thyroid carcinoma.   MRI Face and brain 12/23/22 Brain: No evidence of acute infarct, intracranial hemorrhage, mass, midline shift or extra-axial fluid  collection. No pathologic enhancement. Mild for age chronic microvascular ischemic change.   Trigeminal nerves: No mass is identified along the course of the trigeminal nerves on thin-slice imaging. No evidence of neurovascular compression.   Vascular: Major arterial flow voids are maintained at the skull base.   Sinuses/Orbits: Clear sinuses. No acute or significant orbital findings.   Soft tissues: Unremarkable.  No visible edema or mass lesion.   Osseous: Normal marrow signal without focal lesion. TMJs are located. Left TMJ degenerative change.   Other: None.   IMPRESSION: 1. No evidence of acute abnormality intracranially or in the face. 2. Left TMJ degenerative change.   '  02/01/23 Xray thumb left FINDINGS: Bones are osteopenic. Normal alignment without acute osseous finding or fracture. Moderate degenerative arthropathy of the left first CMC joint, first MCP joint, and the thumb interphalangeal joint. Soft tissues unremarkable.   IMPRESSION: Osteopenia and degenerative changes as above. No acute finding by plain radiography.   Assessment & Plan:   1) Chronic neck pain with prior imaging L spine spondylosis 2) Polyarthralgia with L knee pain, L shoulder pain, b/l hand pain  3) L sided facial pain likely due to TMJ pain.             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   -Discussed risks of opiod medications, increased with benzodiazapine use. She is weaning down on this with her MH provider -Patient discontinued nortriptyline since her last visit she did not feel like this was helping -Continue  UDS and pill counts.  Continue PDMP monitoring.  Pain contract completed prior  visit. -Pill counts consistent  -Patient reports she is seeing psychiatry-has been weaning clorazepate, last prescription was 04/14/2023 by PDMP.  She reports nonbenzodiazepine medications are helping such as Lexapro -Consider Cymbalta -She reports trouble with distance to [facility], she is able to continue regular appointments at this time -Discussed calling our clinic before any surgeries if any different pain medications might be needed -Consider imaging if LBP does not improve-will hold off for now as she is undergoing treatment for her bladder cancer

## 2023-06-01 NOTE — Progress Notes (Unsigned)
 Name: [name] DOB: 05-18-1950 MRN: [id number]  History of Present Illness: [name] is a 73 y.o. female who presents today for follow up visit at [facility] [place].  - GU history: 1. Bladder cancer. - 04/29/2023: Underwent TURBT by Dr. Ronne Binning. 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   Today: BCG Bladder Instillation #2 of 6 She presents today for a BCG treatment for bladder cancer management.   She {Actions; denies-reports:120008} acute urinary symptoms. ***She {Actions; denies-reports:120008} increased urinary urgency, frequency, nocturia, dysuria, gross hematuria, hesitancy, straining to void, or sensations of incomplete emptying. She {Actions; denies-reports:120008} fevers. She {Actions; denies-reports:120008} flank pain or abdominal pain.  Medications: Current Outpatient Medications  Medication Sig Dispense Refill   aspirin 81 MG chewable tablet Chew 81 mg by mouth daily.     escitalopram (LEXAPRO) 5 MG tablet Take 1 tablet (5 mg total) by mouth at bedtime. 30 tablet 2   furosemide (LASIX) 40 MG tablet Take 40 mg by mouth daily as needed for edema.     HYDROcodone-acetaminophen (NORCO) 10-325 MG tablet Take 1 tablet by mouth every 6 (six) hours as needed for moderate pain (pain score 4-6). 120 tablet 0   lisinopril (ZESTRIL) 5 MG tablet Take 5 mg by mouth daily.     Vibegron (GEMTESA) 75 MG TABS Take 1 tablet (75 mg total) by mouth daily.     No current facility-administered medications for this visit.    Allergies: Allergies  Allergen Reactions   Gabapentin Nausea And Vomiting    Other Reaction(s): Not available   Prednisone Palpitations    Other Reaction(s): Not available   Carvedilol     Stopped due to bradycardia   Clonidine Derivatives     Stopped due to bradycardia    Diltiazem     Stopped due to bradycardia    Tape Other (See Comments)    Causes blisters to form   Latex Other (See Comments)  and Rash    Blisters  Other Reaction(s): Not available    Past Medical History:  Diagnosis Date   Anxiety    Arthritis    "hands, arms, back, neck, knees, fingers" (01/21/2016)   Bradycardia    Breast cancer, left breast (HCC) 1970s; 2015   CAD (coronary artery disease)    a. s/p NSTEMI in 2017 with DES to OM and unsuccessful PCI to RCA with wire dissection b. 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   Melanoma of forearm, left (HCC)    Migraine    "a few migraines/year" (01/21/2016)   Obesity    Polypharmacy 12/10/2022   Stroke (HCC) 04/2003   2 carotid artery stents in place; small left parietal and left hemispheric CVA.[name] 01/21/2016   Tobacco use disorder    50 pack years; questionably discontinued in  2010   Wears glasses    Past Surgical History:  Procedure Laterality Date   ABDOMINAL HYSTERECTOMY  1978   APPENDECTOMY     BIOPSY  04/28/2022   Procedure: BIOPSY;  Surgeon: Dolores Frame, MD;  [facility];  Service: Gastroenterology;;   BLADDER INSTILLATION N/A 04/29/2023   Procedure: BLADDER INSTILLATION- gemcitabine;  Surgeon: Malen Gauze, MD;  Location: [facility];  Service: Urology;  Laterality: N/A;   BREAST BIOPSY Left 1970s; 2015   BREAST LUMPECTOMY Left 1970s   BREAST LUMPECTOMY WITH NEEDLE LOCALIZATION Left 07/07/2013   Procedure: BREAST LUMPECTOMY WITH NEEDLE LOCALIZATION;  Surgeon: Robyne Askew, MD;  Location:  [facility];  Service: General;  Laterality: Left;   CARDIAC CATHETERIZATION N/A 01/22/2016   Procedure: Left Heart Cath and Coronary Angiography;  Surgeon: Peter M Swaziland, MD;  Location: [facility] INVASIVE CV LAB;  Service: Cardiovascular;  Laterality: N/A;   CARDIAC CATHETERIZATION N/A 01/22/2016   Procedure: Coronary Stent Intervention;  Surgeon: Peter M Swaziland, MD;  Location: [facility] INVASIVE CV LAB;  Service: Cardiovascular;  Laterality: N/A;   CAROTID STENT Left 04/2003   small left parietal and left hemispheric CVA/notes 07/29/2010   CHOLECYSTECTOMY OPEN     COLONOSCOPY  2011   COLONOSCOPY N/A 05/10/2019   Procedure: COLONOSCOPY;  Surgeon: Malissa Hippo, MD;  [facility];  Service: Endoscopy;  Laterality: N/A;  1:45   COLONOSCOPY WITH PROPOFOL N/A 09/01/2022   Procedure: COLONOSCOPY WITH PROPOFOL;  Surgeon: Dolores Frame, MD;  [facility];  Service: Gastroenterology;  Laterality: N/A;  1:45PM;ASA 3   CORONARY ATHERECTOMY N/A 12/29/2019   Procedure: CORONARY ATHERECTOMY;  Surgeon: Corky Crafts, MD;  Location: [facility] INVASIVE CV LAB;  Service: Cardiovascular;  Laterality: N/A;  Prox RCA   CORONARY STENT INTERVENTION N/A 12/29/2019   Procedure: CORONARY STENT INTERVENTION;  Surgeon: Corky Crafts, MD;  Location: [facility] INVASIVE CV LAB;  Service: Cardiovascular;  Laterality: N/A;  Prox RCA   CORONARY ULTRASOUND/IVUS N/A 12/29/2019   Procedure: Intravascular Ultrasound/IVUS;  Surgeon: Corky Crafts, MD;  Location: [facility] INVASIVE CV LAB;  Service: Cardiovascular;  Laterality: N/A;   CYSTOSCOPY WITH BIOPSY N/A 04/29/2023   Procedure: CYSTOSCOPY WITH BIOPSY;  Surgeon: Malen Gauze, MD;  Location: [facility];  Service: Urology;  Laterality: N/A;   ESOPHAGEAL DILATION N/A 04/28/2022   Procedure: ESOPHAGEAL DILATION;  Surgeon: Dolores Frame, MD;  [facility];  Service: Gastroenterology;  Laterality: N/A;    ESOPHAGOGASTRODUODENOSCOPY  08/06/2011   Procedure: ESOPHAGOGASTRODUODENOSCOPY (EGD);  Surgeon: Malissa Hippo, MD;  [facility];  Service: Endoscopy;  Laterality: N/A;   ESOPHAGOGASTRODUODENOSCOPY N/A 05/10/2019   Procedure: ESOPHAGOGASTRODUODENOSCOPY (EGD);  Surgeon: Malissa Hippo, MD;  [facility];  Service: Endoscopy;  Laterality: N/A;   ESOPHAGOGASTRODUODENOSCOPY (EGD) WITH PROPOFOL N/A 04/28/2022   Procedure: ESOPHAGOGASTRODUODENOSCOPY (EGD) WITH PROPOFOL;  Surgeon: Dolores Frame, MD;  [facility];  Service: Gastroenterology;  Laterality: N/A;  10:30 am, pt can't move up due to transportation   FLEXIBLE SIGMOIDOSCOPY  08/06/2011   Procedure: FLEXIBLE SIGMOIDOSCOPY;  Surgeon: Malissa Hippo, MD;  [facility];  Service: Endoscopy;  Laterality: N/A;   HEMOSTASIS CLIP PLACEMENT  09/01/2022   Procedure: HEMOSTASIS CLIP PLACEMENT;  Surgeon: Dolores Frame, MD;  [facility];  Service: Gastroenterology;;   INGUINAL HERNIA REPAIR Left 1970s   IR ANGIO INTRA EXTRACRAN SEL COM CAROTID INNOMINATE BILAT  MOD SED  11/01/2020   IR ANGIO VERTEBRAL SEL VERTEBRAL BILAT MOD SED  11/01/2020   IR RADIOLOGIST EVAL & MGMT  09/23/2020   IR US GUIDE VASC ACCESS RIGHT  11/01/2020   LARYNX SURGERY  1970s   Polyps excised   LEFT HEART CATH AND CORONARY ANGIOGRAPHY N/A 12/29/2019   Procedure: LEFT HEART CATH AND CORONARY ANGIOGRAPHY;  Surgeon: Corky Crafts, MD;  Location: [facility] INVASIVE CV LAB;  Service: Cardiovascular;  Laterality: N/A;   LEFT HEART CATH AND CORONARY ANGIOGRAPHY N/A 10/12/2022   Procedure: LEFT HEART CATH AND CORONARY ANGIOGRAPHY;  Surgeon: Corky Crafts, MD;  Location: [facility] INVASIVE CV LAB;  Service: Cardiovascular;  Laterality: N/A;   MALONEY DILATION  05/10/2019   Procedure: [name] DILATION;  Surgeon: Malissa Hippo, MD;  [facility];  Service: Endoscopy;;   MELANOMA EXCISION Left ~  2016   forearm   POLYPECTOMY  05/10/2019   Procedure: POLYPECTOMY;  Surgeon: Malissa Hippo, MD;  [facility];  Service: Endoscopy;;  colon   POLYPECTOMY  09/01/2022   Procedure: POLYPECTOMY INTESTINAL;  Surgeon: Dolores Frame, MD;  [facility];  Service: Gastroenterology;;   SPLENECTOMY, 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   spontaneous rupture   SPLENECTOMY, TOTAL     SUBMUCOSAL LIFTING INJECTION  09/01/2022   Procedure: SUBMUCOSAL LIFTING INJECTION;  Surgeon: Dolores Frame, MD;  [facility];  Service: Gastroenterology;;   SUBMUCOSAL TATTOO INJECTION  09/01/2022   Procedure: SUBMUCOSAL TATTOO INJECTION;  Surgeon: Dolores Frame, MD;  [facility];  Service: Gastroenterology;;   TEMPORARY PACEMAKER N/A 12/29/2019   Procedure: TEMPORARY PACEMAKER;  Surgeon: Corky Crafts, MD;  Location: [facility] INVASIVE CV LAB;  Service: Cardiovascular;  Laterality: N/A;   TRANSURETHRAL RESECTION OF BLADDER TUMOR N/A 03/15/2023   Procedure: TRANSURETHRAL RESECTION OF BLADDER TUMOR (TURBT)with Gemcitibane;  Surgeon: Malen Gauze, MD;  Location: [facility];  Service: Urology;  Laterality: N/A;   TRANSURETHRAL RESECTION OF BLADDER TUMOR N/A 04/29/2023   Procedure: TRANSURETHRAL RESECTION OF BLADDER TUMOR (TURBT);  Surgeon: Malen Gauze, MD;  Location: [facility];  Service: Urology;  Laterality: N/A;   Family History  Problem Relation Age of Onset   Heart failure Mother    Cancer Father    Heart failure Father    Cancer Sister    Dementia Sister    Heart disease Other    Arthritis Other    Cancer Other    Diabetes Other    Kidney disease Other    Cancer Sister    Heart failure Brother    Social History   Socioeconomic History   Marital status: Divorced    Spouse name: Not on file   Number of children: 2   Years of education: Not on file   Highest education level: Not on file  Occupational History   Occupation: Disabled    Occupation: retired  Tobacco Use   Smoking status: Some Days    Current packs/day: 0.50    Average packs/day: 0.5 packs/day for 40.0 years (20.0 ttl pk-yrs)    Types: Cigarettes    Passive exposure: Current   Smokeless tobacco: Never   Tobacco comments:    Trying to quit with use of Chantix and calm as of 04/13/2023 but restarted in February with 2 cigarettes per week.   Vaping Use   Vaping status: Never Used  Substance and Sexual Activity   Alcohol use: Not Currently   Drug use: Never  Sexual activity: Not Currently  Other Topics Concern   Not on file  Social History Narrative   ** Merged History Encounter **       Right handed   Wears glasses    Drinks 1 cup of coffee daily   Drinks sweet tea twice a week.   Social Drivers of Corporate investment banker Strain: Low Risk  (12/15/2022)   Overall Financial Resource Strain (CARDIA)    Difficulty of Paying Living Expenses: Not hard at all  Food Insecurity: No Food Insecurity (12/15/2022)   Hunger Vital Sign    Worried About Running Out of Food in the Last Year: Never true    Ran Out of Food in the Last Year: Never true  Transportation Needs: No Transportation Needs (12/15/2022)   PRAPARE - Administrator, Civil Service (Medical): No    Lack of Transportation (Non-Medical): No  Physical Activity: Sufficiently Active (12/15/2022)   Exercise Vital Sign    Days of Exercise per Week: 7 days    Minutes of Exercise per Session: 30 min  Stress: Stress Concern Present (12/15/2022)   [employer] of Occupational Health - Occupational Stress Questionnaire    Feeling of Stress : To some extent  Social Connections: Moderately Isolated (12/15/2022)   Social Connection and Isolation Panel [NHANES]    Frequency of Communication with Friends and Family: More than three times a week    Frequency of Social Gatherings with Friends and Family: More than three times a week    Attends Religious Services: More than 4 times per year     Active Member of [employer] or Organizations: No    Attends Banker Meetings: Never    Marital Status: Divorced  Catering manager Violence: Not At Risk (12/15/2022)   Humiliation, Afraid, Rape, and Kick questionnaire    Fear of Current or Ex-Partner: No    Emotionally Abused: No    Physically Abused: No    Sexually Abused: No    Review of Systems Constitutional: Patient denies any unintentional weight loss or change in strength lntegumentary: Patient denies any rashes or pruritus Cardiovascular: Patient denies chest pain or syncope Respiratory: Patient denies shortness of breath Gastrointestinal: ***Patient {Actions; denies-reports:120008} ***nausea, ***vomiting, ***constipation, ***diarrhea ***As per HPI Musculoskeletal: Patient denies muscle cramps or weakness Neurologic: Patient denies convulsions or seizures  GU: As per HPI.  OBJECTIVE There were no vitals filed for this visit. There is no height or weight on file to calculate BMI.  Physical Examination Constitutional: No obvious distress; patient is non-toxic appearing  Cardiovascular: No visible lower extremity edema.  Respiratory: The patient does not have audible wheezing/stridor; respirations do not appear labored  Gastrointestinal: Abdomen non-distended Musculoskeletal: Normal ROM of UEs  Skin: No obvious rashes/open sores  Neurologic: CN 2-12 grossly intact Psychiatric: Answered questions appropriately with normal affect  Hematologic/Lymphatic/Immunologic: No obvious bruises or sites of spontaneous bleeding  UA:  ***no evidence of UTI ***appears infected   ASSESSMENT No diagnosis found.  She underwent BCG instillation #2 of 6 today; tolerated ***well.  Patient remained in clinic for ***30 minutes following instillation today for monitoring of hypersensitivity reaction; ***none noted.   Post-instillation instructions were reviewed; written instructions ***provided.  Next surveillance cystoscopy  is scheduled on 11/03/2023 with Dr. Ronne Binning.  ***UTI: Patient presented to the clinic today for a scheduled BCG instillation, however treatment deferred due to suspected UTI based on abnormal UA. Will send urine for culture and  ***treat as indicated based on results.  ***  treat empirically with *** while awaiting culture results and sensitivities. Patient to return to clinic next week for next scheduled BCG treatment.  We will ***add an additional treatment to the scheduled BCG series to make up for today's missed instillation.  Will plan for follow up in one week for next BCG instillation. Patient verbalized understanding of and agreement with current plan. All questions were answered.  PLAN Advised the following: 1. ***No follow-ups on file.  No orders of the defined types were placed in this encounter.   It has been explained that the patient is to follow regularly with their PCP in addition to all other providers involved in their care and to follow instructions provided by these respective offices. Patient advised to contact urology clinic if any urologic-pertaining questions, concerns, new symptoms or problems arise in the interim period.  There are no Patient Instructions on file for this visit.  Electronically signed by:  Donnita Falls, FNP   06/01/23    12:47 PM

## 2023-06-03 ENCOUNTER — Ambulatory Visit (INDEPENDENT_AMBULATORY_CARE_PROVIDER_SITE_OTHER): Admitting: Urology

## 2023-06-03 ENCOUNTER — Encounter: Payer: Self-pay | Admitting: Urology

## 2023-06-03 VITALS — BP 149/83 | HR 92

## 2023-06-03 DIAGNOSIS — C672 Malignant neoplasm of lateral wall of bladder: Secondary | ICD-10-CM | POA: Diagnosis not present

## 2023-06-03 LAB — URINALYSIS, ROUTINE W REFLEX MICROSCOPIC
Bilirubin, UA: NEGATIVE
Ketones, UA: NEGATIVE
Nitrite, UA: NEGATIVE
Specific Gravity, UA: 1.03 (ref 1.005–1.030)
Urobilinogen, Ur: 2 mg/dL — ABNORMAL HIGH (ref 0.2–1.0)
pH, UA: 6 (ref 5.0–7.5)

## 2023-06-03 MED ORDER — BCG LIVE 50 MG IS SUSR
3.2400 mL | Freq: Once | INTRAVESICAL | Status: AC
  Administered 2023-06-03: 81 mg via INTRAVESICAL

## 2023-06-03 NOTE — Patient Instructions (Signed)
 Patient Education: Bacillus Calmette-Guerin (BCG) Bladder Instillations for Bladder Cancer  What it is: BCG is a vaccine which is used to prevent tuberculosis (TB).  But it's also a helpful treatment for some early bladder cancers.  When BCG goes directly into the bladder the treatment is described as intravesical. BCG is a type of immunotherapy.  Immunotherapy stimulates the body's immune system to destroy cancer cells.  BCG treatment is administered in an outpatient setting.  It takes a few minutes to administer and you can go home as soon as it's finished.  It might be a good idea to ask someone to bring you, particularly the fist time.  Before Instillation Please be on time for your instillation treatment and prepared to provide a urine specimen to check for infection prior to treatment. Your treatment plan will be modified if you are unwell or have an infection in your urine on a scheduled BCG instillation day. Inform your doctor if you have felt feverish, tired or had chills since your last treatment or if you have been urinating any bright red blood before your instillation. Empty your bladder just before the instillation and give urine specimen to the nurse to check.  During Instillation The liquid medication will be instilled into your bladder through a catheter (a small tube). In most cases, the catheter will be removed from the bladder after the instillation is completed. The medication should be retained (held) in your bladder for 2 hours to obtain the best results.  After Instillation  Immediately after:  - Go straight home after procedure to avoid possibly having to use a public restroom.  - Hold your urine for two hours after the BCG instillation. You should change positions several times once you get home. Ideally, you should reposition from left side to right side and also lie upon your back and your abdomen. Change these positions every 15 minutes to maximize bladder  exposure to the medication for one hour. This can seem strange or difficult, however it is necessary to give the treatment adequate time and contact with all bladder surfaces to work effectively.  After 2 hours:  - Use the toilet to urinate and empty your bladder as completely as possible. - Avoid urine splashing on the toilet seat or getting on your hands. It might be easer for men to sit down if they typically stand when urinating at an ordinary toilet. - After urinating, pour two (2) cups of household bleach (Clorox or equivalent) into the toilet and let sit for 15-20 minutes before flushing. - Wash your hands and genital area thoroughly after you urinate. - Drink plenty of fluids after your instillation to flush your bladder.  Repeat the above process each time you urinate for six (6) hours after each BCG treatment. This is necessary because BCG is a live vaccine and other people shouldn't be exposed to it.  For 2 days (48 hours) after treatment: A condom should be used on males during sex for the first 48 hours after they or their partner under BCG treatment to prevent unintentional contact with any BCG which may be present in the patient's semen or vaginal fluid.  Side effects (common): - Needing to pass urine often - Pain when you pass urine - Blood in urine - Flu-like symptoms (tiredness, general aching and raised temperature)  These side effects should improve within 1-2 days. Drinking lots of water can help flush the drug out of your bladder and reduce some of these effects. Taking  Ibuprofen or Aleve is encouraged unless you have a condition that would make these medications unsafe to take (such as kidney disease, diabetes, etc.).  Side effects which warrant urgent evaluation - Call the Urology office or go to ER if the following occur: New onset of cough, chills, night sweats, fever over 100.64F, joint pain Heavy persistent blood in the urine (not improving over time) Severe  bladder spasms Inability to urinate  Follow up plan: Bladder cancer has a strong tendency to come back throughout your lifetime. It is extremely important that you keep your BCG appointment and have lifelong follow up with your urologist to look in the bladder periodically and check the urine for cancer cells.  Contraception / Water quality scientist / Unintended pregnancy prevention: It is unknown if or how BCG may affect a developing fetus so it is not advisable to become pregnant or to father a child while undergoing BCG instillations.  It is important to use effective contraception during BCG treatment and for six weeks afterwards.   [facility]  [address] [place], [place] [address] Phone: [phone]

## 2023-06-04 LAB — TOXASSURE SELECT,+ANTIDEPR,UR

## 2023-06-07 ENCOUNTER — Ambulatory Visit (INDEPENDENT_AMBULATORY_CARE_PROVIDER_SITE_OTHER): Payer: 59 | Admitting: Gastroenterology

## 2023-06-07 ENCOUNTER — Other Ambulatory Visit: Payer: Self-pay | Admitting: *Deleted

## 2023-06-07 ENCOUNTER — Encounter (INDEPENDENT_AMBULATORY_CARE_PROVIDER_SITE_OTHER): Payer: Self-pay | Admitting: Gastroenterology

## 2023-06-07 VITALS — BP 146/80 | HR 77 | Temp 97.3°F | Ht 63.0 in | Wt 185.7 lb

## 2023-06-07 DIAGNOSIS — F17210 Nicotine dependence, cigarettes, uncomplicated: Secondary | ICD-10-CM

## 2023-06-07 DIAGNOSIS — K754 Autoimmune hepatitis: Secondary | ICD-10-CM

## 2023-06-07 DIAGNOSIS — K743 Primary biliary cirrhosis: Secondary | ICD-10-CM

## 2023-06-07 DIAGNOSIS — Z8551 Personal history of malignant neoplasm of bladder: Secondary | ICD-10-CM

## 2023-06-07 DIAGNOSIS — K219 Gastro-esophageal reflux disease without esophagitis: Secondary | ICD-10-CM

## 2023-06-07 MED ORDER — AZATHIOPRINE 50 MG PO TABS: 50.0000 mg | ORAL_TABLET | Freq: Every day | ORAL | 3 refills | Status: AC

## 2023-06-07 MED ORDER — PANTOPRAZOLE SODIUM 40 MG PO TBEC: 40.0000 mg | DELAYED_RELEASE_TABLET | Freq: Every day | ORAL | 3 refills | Status: AC

## 2023-06-07 MED ORDER — URSODIOL 500 MG PO TABS: 500.0000 mg | ORAL_TABLET | Freq: Two times a day (BID) | ORAL | 3 refills | Status: AC

## 2023-06-07 NOTE — Patient Instructions (Signed)
 We will update some labs in regards to your liver I have sent azathioprine 50mg  to be taken once daily I have sent urosdiol 500mg  to be taken twice daily I have also sent protonix 40mg  to be taken daily prior to breakfast for you reflux  Make sure you are taking all of these medications as prescribed  Follow up 6 months

## 2023-06-07 NOTE — Telephone Encounter (Signed)
Last seen on 12/09/22 No follow up scheduled

## 2023-06-07 NOTE — Progress Notes (Unsigned)
 Referring Provider: Billie Lade, MD Primary Care Physician:  Benita Stabile, MD Primary GI Physician: Dr. Levon Hedger  Chief Complaint  Patient presents with   Follow-up    Patient here today for a follow up on her Autoimmune hepatitis.   HPI:   [name] is a 73 y.o. 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 PSC, GERD  Last seen September 2024, at that time reported some nausea and vomiting, taking zofran PRN. Having some fresh blood in stools when wiping.   Patient recommended to continue AZA 50mg  daily, continue urso 500mg  BID, continue with SLP recommendations, continue zofran PRN, repeat Liver elastography 12/2023.  Last labs in janaury with normal LFTs, CBC WNL other than neutrophils of 9   Present: Patient states that she was diagnosed with bladder cancer in December, she had 2 rounds of radiation and then she started chemotherapy.  GERD has been well controlled, occasionally has an issue with dysphagia, usually she notes worsening swallowing issues when her allergies flare up. She is not currently taking PPI as she states she had to get a new PCP and no refills were ever given. She has occasional GERD symptoms but feels she did better on regularly dosed PPI and wants to go back on this.   She is taking an "egg shaped" pill (dicylomine?) that she states is for her diverticulitis. She takes this every morning.  She does not think she is taking urso or AZA as she states she had to switch PCPs and she is not sure if she had refills on these or not. They are not listed in EPIC but are listed as active under recent PCP visit on  3/20.   She does endorse some bloating to her abdomen that occurred maybe one time since her bladder cancer surgery. No ongoing swelling to her abdomen. She does endorse some swelling to her legs, takes lasix as needed.   Denies episodes of confusion, jaundice or pruritus.   Liver elastography on 12/29/2021 showed a low K PA of 4.5, ratio was 0.13.  Patient was seen by Havery Moros on 12/25/2021 for evaluation of dysphagia. She was given directions to take small sips of water and briefly holding the liquid in her mouth to allow adequate swallowing. She was advised to follow-up with GI as the barium tablet remained in her esophagus.  EGD on 04/28/2022. No abnormalities were seen in the esophagus, which was empirically dilated with an 18 mm savory.Esophageal biopsies showed mild vascular congestion in the mid esophagus but otherwise were normal. There was presence of a 2 cm hiatal hernia. Normal stomach and duodenum.   Last Colonoscopy: 09/01/22 - Hemorrhoids found on perianal exam. - One 12 to 14 mm polyp in the ascending colon, removed piecemeal using a hot snare. Resected and retrieved. Injected. Clips were placed. Clip manufacturer: [employer]. Tattooed. - Three 3 to 6 mm polyps in the ascending colon, removed with a cold snare. Resected and retrieved. - One 12 mm polyp in the transverse colon, removed with a hot snare. Resected and retrieved. Injected. Clip was placed. Clip manufacturer: [employer]. - Diverticulosis in the sigmoid colon and in the descending colon. - Non- bleeding internal hemorrhoids.   Pathology showed one  SSL, 4 colonic tubular adenomas, repeat colonoscopy in 2 years  Filed Weights   06/07/23 1336  Weight: 185 lb 11.2 oz (84.2 kg)     Past Medical History:  Diagnosis Date   Anxiety    Arthritis    "hands, arms, back, neck, knees, fingers" (01/21/2016)   Bradycardia    Breast cancer, left breast (HCC) 1970s; 2015   CAD (coronary artery disease)    a. s/p  NSTEMI in 2017 with DES to OM and unsuccessful PCI to RCA with wire dissection b. 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   Melanoma of forearm, left (HCC)    Migraine    "a few migraines/year" (01/21/2016)   Obesity    Polypharmacy 12/10/2022   Stroke (HCC) 04/2003   2 carotid artery stents in place; small left parietal and left hemispheric CVA.[name] 01/21/2016   Tobacco use disorder    50 pack years; questionably discontinued in 2010   Wears glasses     Past Surgical History:  Procedure Laterality Date   ABDOMINAL HYSTERECTOMY  1978   APPENDECTOMY     BIOPSY  04/28/2022   Procedure: BIOPSY;  Surgeon: Dolores Frame, MD;  [facility];  Service: Gastroenterology;;   BLADDER INSTILLATION N/A 04/29/2023   Procedure: BLADDER INSTILLATION- gemcitabine;  Surgeon: Malen Gauze, MD;  Location: [facility];  Service: Urology;  Laterality: N/A;   BREAST BIOPSY Left 1970s; 2015   BREAST LUMPECTOMY Left 1970s   BREAST LUMPECTOMY WITH NEEDLE LOCALIZATION Left 07/07/2013   Procedure: BREAST LUMPECTOMY WITH NEEDLE LOCALIZATION;  Surgeon: Robyne Askew, MD;  Location: [facility];  Service: General;  Laterality: Left;   CARDIAC CATHETERIZATION N/A 01/22/2016   Procedure: Left Heart Cath and Coronary Angiography;  Surgeon: Peter M Swaziland, MD;  Location: [facility] INVAS[facility] CV LAB;  Service: Cardiovascular;  Laterality: N/A;   CARDIAC CATHETERIZATION  N/A 01/22/2016   Procedure: Coronary Stent Intervention;  Surgeon: Peter M Swaziland, MD;  Location: [facility] INVASIVE CV LAB;  Service: Cardiovascular;  Laterality: N/A;   CAROTID STENT Left 04/2003   small left parietal and left hemispheric CVA/notes 07/29/2010   CHOLECYSTECTOMY OPEN     COLONOSCOPY  2011   COLONOSCOPY N/A 05/10/2019   Procedure: COLONOSCOPY;  Surgeon: Malissa Hippo, MD;  [facility];  Service: Endoscopy;  Laterality: N/A;  1:45   COLONOSCOPY WITH PROPOFOL N/A 09/01/2022   Procedure: COLONOSCOPY WITH PROPOFOL;  Surgeon: Dolores Frame, MD;  [facility];  Service: Gastroenterology;  Laterality: N/A;  1:45PM;ASA 3   CORONARY ATHERECTOMY N/A 12/29/2019   Procedure: CORONARY ATHERECTOMY;  Surgeon: Corky Crafts, MD;  Location: [facility] INVASIVE CV LAB;  Service: Cardiovascular;  Laterality: N/A;  Prox RCA   CORONARY STENT INTERVENTION N/A 12/29/2019   Procedure:  CORONARY STENT INTERVENTION;  Surgeon: Corky Crafts, MD;  Location: [facility]VAS[facility] CV LAB;  Service: Cardiovascular;  Laterality: N/A;  Prox RCA   CORONARY ULTRASOUND/IVUS N/A 12/29/2019   Procedure: Intravascular Ultrasound/IVUS;  Surgeon: Corky Crafts, MD;  Location: [facility] INVASIVE CV LAB;  Service: Cardiovascular;  Laterality: N/A;   CYSTOSCOPY WITH BIOPSY N/A 04/29/2023   Procedure: CYSTOSCOPY WITH BIOPSY;  Surgeon: Malen Gauze, MD;  Location: [facility];  Service: Urology;  Laterality: N/A;   ESOPHAGEAL DILATION N/A 04/28/2022   Procedure: ESOPHAGEAL DILATION;  Surgeon: Dolores Frame, MD;  [facility];  Service: Gastroenterology;  Laterality: N/A;   ESOPHAGOGASTRODUODENOSCOPY  08/06/2011   Procedure: ESOPHAGOGASTRODUODENOSCOPY (EGD);  Surgeon: Malissa Hippo, MD;  [facility];  Service: Endoscopy;  Laterality: N/A;   ESOPHAGOGASTRODUODENOSCOPY N/A 05/10/2019   Procedure: ESOPHAGOGASTRODUODENOSCOPY (EGD);  Surgeon: Malissa Hippo, MD;   [facility];  Service: Endoscopy;  Laterality: N/A;   ESOPHAGOGASTRODUODENOSCOPY (EGD) WITH PROPOFOL N/A 04/28/2022   Procedure: ESOPHAGOGASTRODUODENOSCOPY (EGD) WITH PROPOFOL;  Surgeon: Dolores Frame, MD;  [facility];  Service: Gastroenterology;  Laterality: N/A;  10:30 am, pt can't move up due to transportation   FLEXIBLE SIGMOIDOSCOPY  08/06/2011   Procedure: FLEXIBLE SIGMOIDOSCOPY;  Surgeon: Malissa Hippo, MD;  [facility];  Service: Endoscopy;  Laterality: N/A;   HEMOSTASIS CLIP PLACEMENT  09/01/2022   Procedure: HEMOSTASIS CLIP PLACEMENT;  Surgeon: Dolores Frame, MD;  [facility];  Service: Gastroenterology;;   INGUINAL HERNIA REPAIR Left 1970s   IR ANGIO INTRA EXTRACRAN SEL COM CAROTID INNOMINATE BILAT MOD SED  11/01/2020   IR ANGIO VERTEBRAL SEL VERTEBRAL BILAT MOD SED  11/01/2020   IR RADIOLOGIST EVAL & MGMT  09/23/2020   IR US GUIDE VASC ACCESS RIGHT  11/01/2020   LARYNX SURGERY  1970s   Polyps excised   LEFT HEART CATH AND CORONARY ANGIOGRAPHY N/A 12/29/2019   Procedure: LEFT HEART CATH AND CORONARY ANGIOGRAPHY;  Surgeon: Corky Crafts, MD;  Location: [facility] CV LAB;  Service: Cardiovascular;  Laterality: N/A;   LEFT HEART CATH AND CORONARY ANGIOGRAPHY N/A 10/12/2022   Procedure: LEFT HEART CATH AND CORONARY ANGIOGRAPHY;  Surgeon: Corky Crafts, MD;  Location: [facility] INVASIVE CV LAB;  Service: Cardiovascular;  Laterality: N/A;   MALONEY DILATION  05/10/2019   Procedure: [name] DILATION;  Surgeon: Malissa Hippo, MD;  [facility];  Service: Endoscopy;;   MELANOMA EXCISION Left ~ 2016   forearm   POLYPECTOMY  05/10/2019   Procedure: POLYPECTOMY;  Surgeon: Malissa Hippo, MD;  [facility];  Service: Endoscopy;;  colon   POLYPECTOMY  09/01/2022   Procedure: POLYPECTOMY INTESTINAL;  Surgeon: Dolores Frame, MD;  [facility];  Service:  Gastroenterology;;   SPLENECTOMY, 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   spontaneous rupture   SPLENECTOMY, TOTAL     SUBMUCOSAL LIFTING INJECTION  09/01/2022   Procedure: SUBMUCOSAL LIFTING INJECTION;  Surgeon: Dolores Frame, MD;  [facility];  Service: Gastroenterology;;   SUBMUCOSAL TATTOO INJECTION  09/01/2022   Procedure: SUBMUCOSAL TATTOO INJECTION;  Surgeon: Dolores Frame, MD;  [facility];  Service: Gastroenterology;;   TEMPORARY PACEMAKER N/A 12/29/2019   Procedure: TEMPORARY PACEMAKER;  Surgeon: Corky Crafts, MD;  Location: [facility] INVASIVE CV LAB;  Service: Cardiovascular;  Laterality: N/A;   TRANSURETHRAL RESECTION OF BLADDER TUMOR N/A 03/15/2023   Procedure: TRANSURETHRAL RESECTION OF BLADDER TUMOR (  TURBT)with Gemcitibane;  Surgeon: Malen Gauze, MD;  Location: [facility];  Service: Urology;  Laterality: N/A;   TRANSURETHRAL RESECTION OF BLADDER TUMOR N/A 04/29/2023   Procedure: TRANSURETHRAL RESECTION OF BLADDER TUMOR (TURBT);  Surgeon: Malen Gauze, MD;  Location: [facility];  Service: Urology;  Laterality: N/A;    Current Outpatient Medications  Medication Sig Dispense Refill   aspirin 81 MG chewable tablet Chew 81 mg by mouth daily.     escitalopram (LEXAPRO) 5 MG tablet Take 1 tablet (5 mg total) by mouth at bedtime. 30 tablet 2   furosemide (LASIX) 40 MG tablet Take 40 mg by mouth daily as needed for edema.     HYDROcodone-acetaminophen (NORCO) 10-325 MG tablet Take 1 tablet by mouth every 6 (six) hours as needed for moderate pain (pain score 4-6). 120 tablet 0   lisinopril (ZESTRIL) 5 MG tablet Take 5 mg by mouth daily.     OVER THE COUNTER MEDICATION Vit D 3 2000 daily.     Vibegron (GEMTESA) 75 MG TABS Take 1 tablet (75 mg total) by mouth daily.     No current facility-administered medications for this visit.    Allergies as of 06/07/2023 - Review Complete 06/07/2023  Allergen Reaction Noted   Gabapentin Nausea And Vomiting  03/22/2019   Prednisone Palpitations 03/22/2019   Carvedilol  07/09/2022   Clonidine derivatives  07/09/2022   Diltiazem  07/09/2022   Tape Other (See Comments) 12/12/2010   Latex Other (See Comments) and Rash 07/26/2012    Social History   Socioeconomic History   Marital status: Divorced    Spouse name: Not on file   Number of children: 2   Years of education: Not on file   Highest education level: Not on file  Occupational History   Occupation: Disabled   Occupation: retired  Tobacco Use   Smoking status: Some Days    Current packs/day: 0.50    Average packs/day: 0.5 packs/day for 40.0 years (20.0 ttl pk-yrs)    Types: Cigarettes    Passive exposure: Current   Smokeless tobacco: Never   Tobacco comments:    Trying to quit with use of Chantix and calm as of 04/13/2023 but restarted in February with 2 cigarettes per week.   Vaping Use   Vaping status: Never Used  Substance and Sexual Activity   Alcohol use: Not Currently   Drug use: Never   Sexual activity: Not Currently  Other Topics Concern   Not on file  Social History Narrative   ** Merged History Encounter **       Right handed   Wears glasses    Drinks 1 cup of coffee daily   Drinks sweet tea twice a week.   Social Drivers of Corporate investment banker Strain: Low Risk  (12/15/2022)   Overall Financial Resource Strain (CARDIA)    Difficulty of Paying Living Expenses: Not hard at all  Food Insecurity: No Food Insecurity (12/15/2022)   Hunger Vital Sign    Worried About Running Out of Food in the Last Year: Never true    Ran Out of Food in the Last Year: Never true  Transportation Needs: No Transportation Needs (12/15/2022)   PRAPARE - Administrator, Civil Service (Medical): No    Lack of Transportation (Non-Medical): No  Physical Activity: Sufficiently Active (12/15/2022)   Exercise Vital Sign    Days of Exercise per Week: 7 days    Minutes of Exercise per Session: 30 min  Stress: Stress  Concern Present (12/15/2022)   [employer] of Occupational Health - Occupational Stress Questionnaire    Feeling of Stress : To some extent  Social Connections: Moderately Isolated (12/15/2022)   Social Connection and Isolation Panel [NHANES]    Frequency of Communication with Friends and Family: More than three times a week    Frequency of Social Gatherings with Friends and Family: More than three times a week    Attends Religious Services: More than 4 times per year    Active Member of [employer] or Organizations: No    Attends Engineer, structural: Never    Marital Status: Divorced    Review of systems General: negative for malaise, night sweats, fever, chills, weight loss Neck: Negative for lumps, goiter, pain and significant neck swelling Resp: Negative for cough, wheezing, dyspnea at rest CV: Negative for chest pain, leg swelling, palpitations, orthopnea GI: denies melena, hematochezia, nausea, vomiting, diarrhea, constipation, dysphagia, odyonophagia, early satiety or unintentional weight loss.  Derm: Negative for itching or rash Psych: Denies depression, anxiety, memory loss, confusion. No homicidal or suicidal ideation.  Heme: Negative for prolonged bleeding, bruising easily, and swollen nodes. The remainder of the review of systems is noncontributory.  Physical Exam: BP (!) 146/80 (BP Location: Left Arm, Patient Position: Sitting, Cuff Size: Normal)   Pulse 77   Temp (!) 97.3 F (36.3 C) (Temporal)   Ht 5\' 3"  (1.6 m)   Wt 185 lb 11.2 oz (84.2 kg)   BMI 32.90 kg/m  General:   Alert and oriented. No distress noted. Pleasant and cooperative.  Head:  Normocephalic and atraumatic. Eyes:  Conjuctiva clear without scleral icterus. Mouth:  Oral mucosa pink and moist. Good dentition. No lesions. Heart: Normal rate and rhythm, s1 and s2 heart sounds present.  Lungs: Clear lung sounds in all lobes. Respirations equal and unlabored. Abdomen:  +BS, soft, non-tender and  non-distended. No rebound or guarding. No HSM or masses noted. Derm: No palmar erythema or jaundice Extremities:  Without edema. Neurologic:  Alert and  oriented x4 Psych:  Alert and cooperative. Normal mood and affect.  Invalid input(s): "6 MONTHS"   ASSESSMENT: [name] is a 73 y.o. 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 She notes recently being off of her URSO and AZA as she thinks she tried to get a refill but was told by her pharmacy they were unable to provide these. Our office has not denied any refill requests for her. I am recommending we restart AZA 50mg  daily and Urso 500mg  BID at this time. Will update HFP and IgG given she has been off of her medications for an unknown amount of time. Will plan to repeat liver US with elastography in October given history of bridging fibrosis. Of note she was recently diagnosed with bladder cancer which she seems to be recovering from well, Will need to monitor for recurrence of malignancy in setting of AZA use. CBC with diff in December without any evidence of myelosuppression.   Her GERD was well managed on Protonix 40mg  daily though she notes being off of her PPI for an unknown amount of time as well. She notes some occasional GERD symptoms, occasional Dysphagia, felt she did better on PPI and is requesting resuming this.  PLAN:  -restart AZA 50mg  daily -restart Urso 500mg  BID -check IgG, HFP -restart protonix 40mg  daily -repeat liver elastography 12/2023  All questions were answered, patient verbalized understanding and is in agreement with plan as outlined above.    Follow Up: 6 months   Kalman Nylen L. Jeanmarie Hubert, MSN, APRN, AGNP-C Adult-Gerontology Nurse Practitioner [facility]  I have  reviewed the note and agree with the APP's assessment as described in this progress note  Katrinka Blazing, MD Gastroenterology and Hepatology [facility]

## 2023-06-08 ENCOUNTER — Ambulatory Visit: Payer: 59 | Admitting: Neurology

## 2023-06-08 ENCOUNTER — Encounter (INDEPENDENT_AMBULATORY_CARE_PROVIDER_SITE_OTHER): Payer: Self-pay

## 2023-06-08 LAB — IGG: IgG (Immunoglobin G), Serum: 1443 mg/dL (ref 600–1540)

## 2023-06-08 LAB — HEPATIC FUNCTION PANEL
AG Ratio: 1.2 (calc) (ref 1.0–2.5)
ALT: 27 U/L (ref 6–29)
AST: 24 U/L (ref 10–35)
Albumin: 4.1 g/dL (ref 3.6–5.1)
Alkaline phosphatase (APISO): 98 U/L (ref 37–153)
Bilirubin, Direct: 0.1 mg/dL (ref 0.0–0.2)
Globulin: 3.3 g/dL (ref 1.9–3.7)
Indirect Bilirubin: 0.2 mg/dL (ref 0.2–1.2)
Total Bilirubin: 0.3 mg/dL (ref 0.2–1.2)
Total Protein: 7.4 g/dL (ref 6.1–8.1)

## 2023-06-09 NOTE — Progress Notes (Unsigned)
 Name: [name] DOB: 1950/10/29 MRN: [id number]  History of Present Illness: [name] is a 73 y.o. female who presents today for follow up visit at [facility].  GU History includes: 1. Bladder cancer. - 04/29/2023: Underwent TURBT by Dr. Ronne Binning. 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   Today: BCG Bladder Instillation #3 of 6 She presents today for a BCG treatment for bladder cancer management.   She {Actions; denies-reports:120008} acute urinary symptoms. ***She {Actions; denies-reports:120008} increased urinary urgency, frequency, nocturia, dysuria, gross hematuria, hesitancy, straining to void, or sensations of incomplete emptying. She {Actions; denies-reports:120008} fevers. She {Actions; denies-reports:120008} flank pain or abdominal pain.  Medications: Current Outpatient Medications  Medication Sig Dispense Refill   aspirin 81 MG chewable tablet Chew 81 mg by mouth daily.     azaTHIOprine (IMURAN) 50 MG tablet Take 1 tablet (50 mg total) by mouth daily. 90 tablet 3   escitalopram (LEXAPRO) 5 MG tablet Take 1 tablet (5 mg total) by mouth at bedtime. 30 tablet 2   furosemide (LASIX) 40 MG tablet Take 40 mg by mouth daily as needed for edema.     HYDROcodone-acetaminophen (NORCO) 10-325 MG tablet Take 1 tablet by mouth every 6 (six) hours as needed for moderate pain (pain score 4-6). 120 tablet 0   lisinopril (ZESTRIL) 5 MG tablet Take 5 mg by mouth daily.     OVER THE COUNTER MEDICATION Vit D 3 2000 daily.     pantoprazole (PROTONIX) 40 MG tablet Take 1 tablet (40 mg total) by mouth daily before breakfast. 90 tablet 3   ursodiol (URSO FORTE) 500 MG tablet Take 1 tablet (500 mg total) by mouth 2 (two) times daily. 180 tablet 3   Vibegron (GEMTESA) 75 MG TABS Take 1 tablet (75 mg total) by mouth daily.     No current facility-administered medications for this visit.    Allergies: Allergies  Allergen  Reactions   Gabapentin Nausea And Vomiting    Other Reaction(s): Not available   Prednisone Palpitations    Other Reaction(s): Not available   Carvedilol     Stopped due to bradycardia   Clonidine Derivatives     Stopped due to bradycardia    Diltiazem     Stopped due to bradycardia    Tape Other (See Comments)    Causes blisters to form   Latex Other (See Comments) and Rash    Blisters  Other Reaction(s): Not available    Past Medical History:  Diagnosis Date   Anxiety    Arthritis    "hands, arms, back, neck, knees, fingers" (01/21/2016)   Bradycardia    Breast cancer, left breast (HCC) 1970s; 2015   CAD (coronary artery disease)    a. s/p NSTEMI in 2017 with DES to OM and unsuccessful PCI to RCA with wire dissection b. DES to RCA in 2021   Cancer of skin of leg    BLE   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HCC)    left   Chronic lower back pain    Chronic pain    COPD (chronic obstructive pulmonary disease) (HCC)    Coronary artery disease    Depression    GERD (gastroesophageal reflux disease)    Heart murmur    Hepatic cirrhosis (HCC) 12/20/2017   Hepatitis    History of blood transfusion    "related to spleen"   History of hiatal hernia    History of kidney stones    Hyperlipidemia  Hypertension    Hypothyroidism    Long term prescription benzodiazepine use 12/10/2022   Melanoma of forearm, left (HCC)    Migraine    "a few migraines/year" (01/21/2016)   Obesity    Polypharmacy 12/10/2022   Stroke (HCC) 04/2003   2 carotid artery stents in place; small left parietal and left hemispheric CVA.[name] 01/21/2016   Tobacco use disorder    50 pack years; questionably discontinued in 2010   Wears glasses    Past Surgical History:  Procedure Laterality Date   ABDOMINAL HYSTERECTOMY  1978   APPENDECTOMY     BIOPSY  04/28/2022   Procedure: BIOPSY;  Surgeon: Dolores Frame, MD;  [facility];  Service: Gastroenterology;;   BLADDER INSTILLATION  N/A 04/29/2023   Procedure: BLADDER INSTILLATION- gemcitabine;  Surgeon: Malen Gauze, MD;  Location: [facility];  Service: Urology;  Laterality: N/A;   BREAST BIOPSY Left 1970s; 2015   BREAST LUMPECTOMY Left 1970s   BREAST LUMPECTOMY WITH NEEDLE LOCALIZATION Left 07/07/2013   Procedure: BREAST LUMPECTOMY WITH NEEDLE LOCALIZATION;  Surgeon: Robyne Askew, MD;  Location: [facility];  Service: General;  Laterality: Left;   CARDIAC CATHETERIZATION N/A 01/22/2016   Procedure: Left Heart Cath and Coronary Angiography;  Surgeon: Peter M Swaziland, MD;  Location: [facility] INVASIVE CV LAB;  Service: Cardiovascular;  Laterality: N/A;   CARDIAC CATHETERIZATION N/A 01/22/2016   Procedure: Coronary Stent Intervention;  Surgeon: Peter M Swaziland, MD;  Location: [facility] INVASIVE CV LAB;  Service: Cardiovascular;  Laterality: N/A;   CAROTID STENT Left 04/2003   small left parietal and left hemispheric CVA/notes 07/29/2010   CHOLECYSTECTOMY OPEN     COLONOSCOPY  2011   COLONOSCOPY N/A 05/10/2019   Procedure: COLONOSCOPY;  Surgeon: Malissa Hippo, MD;  [facility];  Service: Endoscopy;  Laterality: N/A;  1:45   COLONOSCOPY WITH PROPOFOL N/A 09/01/2022   Procedure: COLONOSCOPY WITH PROPOFOL;  Surgeon: Dolores Frame, MD;  [facility];  Service: Gastroenterology;  Laterality: N/A;  1:45PM;ASA 3   CORONARY ATHERECTOMY N/A 12/29/2019   Procedure: CORONARY ATHERECTOMY;  Surgeon: Corky Crafts, MD;  Location: [facility] INVASIVE CV LAB;  Service: Cardiovascular;  Laterality: N/A;  Prox RCA   CORONARY STENT INTERVENTION N/A 12/29/2019   Procedure: CORONARY STENT INTERVENTION;  Surgeon: Corky Crafts, MD;  Location: [facility] INVASIVE CV LAB;  Service: Cardiovascular;  Laterality: N/A;  Prox RCA   CORONARY ULTRASOUND/IVUS N/A 12/29/2019   Procedure: Intravascular Ultrasound/IVUS;  Surgeon: Corky Crafts, MD;  Location: [facility] INVASIVE CV LAB;  Service: Cardiovascular;   Laterality: N/A;   CYSTOSCOPY WITH BIOPSY N/A 04/29/2023   Procedure: CYSTOSCOPY WITH BIOPSY;  Surgeon: Malen Gauze, MD;  Location: [facility];  Service: Urology;  Laterality: N/A;   ESOPHAGEAL DILATION N/A 04/28/2022   Procedure: ESOPHAGEAL DILATION;  Surgeon: Dolores Frame, MD;  [facility];  Service: Gastroenterology;  Laterality: N/A;   ESOPHAGOGASTRODUODENOSCOPY  08/06/2011   Procedure: ESOPHAGOGASTRODUODENOSCOPY (EGD);  Surgeon: Malissa Hippo, MD;  [facility];  Service: Endoscopy;  Laterality: N/A;   ESOPHAGOGASTRODUODENOSCOPY N/A 05/10/2019   Procedure: ESOPHAGOGASTRODUODENOSCOPY (EGD);  Surgeon: Malissa Hippo, MD;  [facility];  Service: Endoscopy;  Laterality: N/A;   ESOPHAGOGASTRODUODENOSCOPY (EGD) WITH PROPOFOL N/A 04/28/2022   Procedure: ESOPHAGOGASTRODUODENOSCOPY (EGD) WITH PROPOFOL;  Surgeon: Dolores Frame, MD;  [facility];  Service: Gastroenterology;  Laterality: N/A;  10:30 am, pt can't move up due  to transportation   FLEXIBLE SIGMOIDOSCOPY  08/06/2011   Procedure: FLEXIBLE SIGMOIDOSCOPY;  Surgeon: Malissa Hippo, MD;  [facility];  Service: Endoscopy;  Laterality: N/A;   HEMOSTASIS CLIP PLACEMENT  09/01/2022   Procedure: HEMOSTASIS CLIP PLACEMENT;  Surgeon: Dolores Frame, MD;  [facility];  Service: Gastroenterology;;   INGUINAL HERNIA REPAIR Left 1970s   IR ANGIO INTRA EXTRACRAN SEL COM CAROTID INNOMINATE BILAT MOD SED  11/01/2020   IR ANGIO VERTEBRAL SEL VERTEBRAL BILAT MOD SED  11/01/2020   IR RADIOLOGIST EVAL & MGMT  09/23/2020   IR US GUIDE VASC ACCESS RIGHT  11/01/2020   LARYNX SURGERY  1970s   Polyps excised   LEFT HEART CATH AND CORONARY ANGIOGRAPHY N/A 12/29/2019   Procedure: LEFT HEART CATH AND CORONARY ANGIOGRAPHY;  Surgeon: Corky Crafts, MD;  Location: [facility] INVASIVE CV LAB;  Service: Cardiovascular;  Laterality: N/A;   LEFT HEART CATH AND  CORONARY ANGIOGRAPHY N/A 10/12/2022   Procedure: LEFT HEART CATH AND CORONARY ANGIOGRAPHY;  Surgeon: Corky Crafts, MD;  Location: [facility] INVASIVE CV LAB;  Service: Cardiovascular;  Laterality: N/A;   MALONEY DILATION  05/10/2019   Procedure: [name] DILATION;  Surgeon: Malissa Hippo, MD;  [facility];  Service: Endoscopy;;   MELANOMA EXCISION Left ~ 2016   forearm   POLYPECTOMY  05/10/2019   Procedure: POLYPECTOMY;  Surgeon: Malissa Hippo, MD;  [facility];  Service: Endoscopy;;  colon   POLYPECTOMY  09/01/2022   Procedure: POLYPECTOMY INTESTINAL;  Surgeon: Dolores Frame, MD;  [facility];  Service: Gastroenterology;;   SPLENECTOMY, 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   spontaneous rupture   SPLENECTOMY, TOTAL     SUBMUCOSAL LIFTING INJECTION  09/01/2022   Procedure: SUBMUCOSAL LIFTING INJECTION;  Surgeon: Dolores Frame, MD;  [facility];  Service: Gastroenterology;;   SUBMUCOSAL TATTOO INJECTION  09/01/2022   Procedure: SUBMUCOSAL TATTOO INJECTION;  Surgeon: Dolores Frame, MD;  [facility];  Service: Gastroenterology;;   TEMPORARY PACEMAKER N/A 12/29/2019   Procedure: TEMPORARY PACEMAKER;  Surgeon: Corky Crafts, MD;  Location: [facility] INVASIVE CV LAB;  Service: Cardiovascular;  Laterality: N/A;   TRANSURETHRAL RESECTION OF BLADDER TUMOR N/A 03/15/2023   Procedure: TRANSURETHRAL RESECTION OF BLADDER TUMOR (TURBT)with Gemcitibane;  Surgeon: Malen Gauze, MD;  Location: [facility];  Service: Urology;  Laterality: N/A;   TRANSURETHRAL RESECTION OF BLADDER TUMOR N/A 04/29/2023   Procedure: TRANSURETHRAL RESECTION OF BLADDER TUMOR (TURBT);  Surgeon: Malen Gauze, MD;  Location: [facility];  Service: Urology;  Laterality: N/A;   Family History  Problem Relation Age of Onset   Heart failure Mother    Cancer Father    Heart failure Father    Cancer Sister    Dementia Sister    Heart disease Other     Arthritis Other    Cancer Other    Diabetes Other    Kidney disease Other    Cancer Sister    Heart failure Brother    Social History   Socioeconomic History   Marital status: Divorced    Spouse name: Not on file   Number of children: 2   Years of education: Not on file   Highest education level: Not on file  Occupational History   Occupation: Disabled   Occupation: retired  Tobacco Use   Smoking status: Some Days    Current packs/day: 0.50    Average packs/day: 0.5 packs/day for  40.0 years (20.0 ttl pk-yrs)    Types: Cigarettes    Passive exposure: Current   Smokeless tobacco: Never   Tobacco comments:    Trying to quit with use of Chantix and calm as of 04/13/2023 but restarted in February with 2 cigarettes per week.   Vaping Use   Vaping status: Never Used  Substance and Sexual Activity   Alcohol use: Not Currently   Drug use: Never   Sexual activity: Not Currently  Other Topics Concern   Not on file  Social History Narrative   ** Merged History Encounter **       Right handed   Wears glasses    Drinks 1 cup of coffee daily   Drinks sweet tea twice a week.   Social Drivers of Corporate investment banker Strain: Low Risk  (12/15/2022)   Overall Financial Resource Strain (CARDIA)    Difficulty of Paying Living Expenses: Not hard at all  Food Insecurity: No Food Insecurity (12/15/2022)   Hunger Vital Sign    Worried About Running Out of Food in the Last Year: Never true    Ran Out of Food in the Last Year: Never true  Transportation Needs: No Transportation Needs (12/15/2022)   PRAPARE - Administrator, Civil Service (Medical): No    Lack of Transportation (Non-Medical): No  Physical Activity: Sufficiently Active (12/15/2022)   Exercise Vital Sign    Days of Exercise per Week: 7 days    Minutes of Exercise per Session: 30 min  Stress: Stress Concern Present (12/15/2022)   [employer] of Occupational Health - Occupational Stress Questionnaire     Feeling of Stress : To some extent  Social Connections: Moderately Isolated (12/15/2022)   Social Connection and Isolation Panel [NHANES]    Frequency of Communication with Friends and Family: More than three times a week    Frequency of Social Gatherings with Friends and Family: More than three times a week    Attends Religious Services: More than 4 times per year    Active Member of [employer] or Organizations: No    Attends Banker Meetings: Never    Marital Status: Divorced  Catering manager Violence: Not At Risk (12/15/2022)   Humiliation, Afraid, Rape, and Kick questionnaire    Fear of Current or Ex-Partner: No    Emotionally Abused: No    Physically Abused: No    Sexually Abused: No    Review of Systems Constitutional: Patient denies any unintentional weight loss or change in strength lntegumentary: Patient denies any rashes or pruritus Cardiovascular: Patient denies chest pain or syncope Respiratory: Patient denies shortness of breath Gastrointestinal: ***Patient {Actions; denies-reports:120008} ***nausea, ***vomiting, ***constipation, ***diarrhea ***As per HPI Musculoskeletal: Patient denies muscle cramps or weakness Neurologic: Patient denies convulsions or seizures  GU: As per HPI.  OBJECTIVE There were no vitals filed for this visit. There is no height or weight on file to calculate BMI.  Physical Examination Constitutional: No obvious distress; patient is non-toxic appearing  Cardiovascular: No visible lower extremity edema.  Respiratory: The patient does not have audible wheezing/stridor; respirations do not appear labored  Gastrointestinal: Abdomen non-distended Musculoskeletal: Normal ROM of UEs  Skin: No obvious rashes/open sores  Neurologic: CN 2-12 grossly intact Psychiatric: Answered questions appropriately with normal affect  Hematologic/Lymphatic/Immunologic: No obvious bruises or sites of spontaneous bleeding  UA:  ***no evidence of  UTI ***appears infected   ASSESSMENT No diagnosis found.  She underwent BCG instillation #3 of 6 today;  tolerated ***well.  Patient remained in clinic for ***30 minutes following instillation today for monitoring of hypersensitivity reaction; ***none noted.   Post-instillation instructions were reviewed; written instructions ***provided.  Next surveillance cystoscopy is scheduled on 11/03/2023 with Dr. Ronne Binning.  ***UTI: Patient presented to the clinic today for a scheduled BCG instillation, however treatment deferred due to suspected UTI based on abnormal UA. Will send urine for culture and  ***treat as indicated based on results.  *** treat empirically with *** while awaiting culture results and sensitivities. Patient to return to clinic next week for next scheduled BCG treatment.  We will ***add an additional treatment to the scheduled BCG series to make up for today's missed instillation.  Will plan for follow up in one week for next BCG instillation. Patient verbalized understanding of and agreement with current plan. All questions were answered.  PLAN Advised the following: 1. ***No follow-ups on file.  No orders of the defined types were placed in this encounter.   It has been explained that the patient is to follow regularly with their PCP in addition to all other providers involved in their care and to follow instructions provided by these respective offices. Patient advised to contact urology clinic if any urologic-pertaining questions, concerns, new symptoms or problems arise in the interim period.  There are no Patient Instructions on file for this visit.  Electronically signed by:  Donnita Falls, FNP   06/09/23    2:53 PM

## 2023-06-10 ENCOUNTER — Ambulatory Visit (INDEPENDENT_AMBULATORY_CARE_PROVIDER_SITE_OTHER): Admitting: Urology

## 2023-06-10 ENCOUNTER — Encounter: Payer: Self-pay | Admitting: Urology

## 2023-06-10 ENCOUNTER — Other Ambulatory Visit: Payer: Self-pay | Admitting: Cardiology

## 2023-06-10 VITALS — BP 185/72 | HR 94 | Temp 97.9°F

## 2023-06-10 DIAGNOSIS — C672 Malignant neoplasm of lateral wall of bladder: Secondary | ICD-10-CM | POA: Diagnosis not present

## 2023-06-10 LAB — MICROSCOPIC EXAMINATION

## 2023-06-10 LAB — URINALYSIS, ROUTINE W REFLEX MICROSCOPIC
Bilirubin, UA: NEGATIVE
Glucose, UA: NEGATIVE
Leukocytes,UA: NEGATIVE
Nitrite, UA: NEGATIVE
Specific Gravity, UA: 1.03 (ref 1.005–1.030)
Urobilinogen, Ur: 1 mg/dL (ref 0.2–1.0)
pH, UA: 5.5 (ref 5.0–7.5)

## 2023-06-10 MED ORDER — BCG LIVE 50 MG IS SUSR
3.2400 mL | Freq: Once | INTRAVESICAL | Status: AC
  Administered 2023-06-10: 81 mg via INTRAVESICAL

## 2023-06-10 NOTE — Patient Instructions (Signed)
 Patient Education: Bacillus Calmette-Guerin (BCG) Bladder Instillations for Bladder Cancer  What it is: BCG is a vaccine which is used to prevent tuberculosis (TB).  But it's also a helpful treatment for some early bladder cancers.  When BCG goes directly into the bladder the treatment is described as intravesical. BCG is a type of immunotherapy.  Immunotherapy stimulates the body's immune system to destroy cancer cells.  BCG treatment is administered in an outpatient setting.  It takes a few minutes to administer and you can go home as soon as it's finished.  It might be a good idea to ask someone to bring you, particularly the fist time.  Before Instillation Please be on time for your instillation treatment and prepared to provide a urine specimen to check for infection prior to treatment. Your treatment plan will be modified if you are unwell or have an infection in your urine on a scheduled BCG instillation day. Inform your doctor if you have felt feverish, tired or had chills since your last treatment or if you have been urinating any bright red blood before your instillation. Empty your bladder just before the instillation and give urine specimen to the nurse to check.  During Instillation The liquid medication will be instilled into your bladder through a catheter (a small tube). In most cases, the catheter will be removed from the bladder after the instillation is completed. The medication should be retained (held) in your bladder for 2 hours to obtain the best results.  After Instillation  Immediately after:  - Go straight home after procedure to avoid possibly having to use a public restroom.  - Hold your urine for two hours after the BCG instillation. You should change positions several times once you get home. Ideally, you should reposition from left side to right side and also lie upon your back and your abdomen. Change these positions every 15 minutes to maximize bladder  exposure to the medication for one hour. This can seem strange or difficult, however it is necessary to give the treatment adequate time and contact with all bladder surfaces to work effectively.  After 2 hours:  - Use the toilet to urinate and empty your bladder as completely as possible. - Avoid urine splashing on the toilet seat or getting on your hands. It might be easer for men to sit down if they typically stand when urinating at an ordinary toilet. - After urinating, pour two (2) cups of household bleach (Clorox or equivalent) into the toilet and let sit for 15-20 minutes before flushing. - Wash your hands and genital area thoroughly after you urinate. - Drink plenty of fluids after your instillation to flush your bladder.  Repeat the above process each time you urinate for six (6) hours after each BCG treatment. This is necessary because BCG is a live vaccine and other people shouldn't be exposed to it.  For 2 days (48 hours) after treatment: A condom should be used on males during sex for the first 48 hours after they or their partner under BCG treatment to prevent unintentional contact with any BCG which may be present in the patient's semen or vaginal fluid.  Side effects (common): - Needing to pass urine often - Pain when you pass urine - Blood in urine - Flu-like symptoms (tiredness, general aching and raised temperature)  These side effects should improve within 1-2 days. Drinking lots of water can help flush the drug out of your bladder and reduce some of these effects. Taking  Ibuprofen or Aleve is encouraged unless you have a condition that would make these medications unsafe to take (such as kidney disease, diabetes, etc.).  Side effects which warrant urgent evaluation - Call the Urology office or go to ER if the following occur: New onset of cough, chills, night sweats, fever over 100.64F, joint pain Heavy persistent blood in the urine (not improving over time) Severe  bladder spasms Inability to urinate  Follow up plan: Bladder cancer has a strong tendency to come back throughout your lifetime. It is extremely important that you keep your BCG appointment and have lifelong follow up with your urologist to look in the bladder periodically and check the urine for cancer cells.  Contraception / Water quality scientist / Unintended pregnancy prevention: It is unknown if or how BCG may affect a developing fetus so it is not advisable to become pregnant or to father a child while undergoing BCG instillations.  It is important to use effective contraception during BCG treatment and for six weeks afterwards.   [facility]  [address] [place], [place] [address] Phone: [phone]

## 2023-06-17 ENCOUNTER — Telehealth: Payer: Self-pay

## 2023-06-17 ENCOUNTER — Other Ambulatory Visit (HOSPITAL_COMMUNITY): Payer: Self-pay

## 2023-06-17 ENCOUNTER — Telehealth: Payer: Self-pay | Admitting: Urology

## 2023-06-17 ENCOUNTER — Other Ambulatory Visit: Payer: Self-pay | Admitting: Neurology

## 2023-06-17 MED ORDER — CYCLOBENZAPRINE HCL 5 MG PO TABS: 5.0000 mg | ORAL_TABLET | Freq: Every evening | ORAL | 1 refills | Status: DC | PRN

## 2023-06-17 NOTE — Telephone Encounter (Signed)
 Let her know script placed. Flexeril can cause sedation, let her know to be careful. Also can send her the flexeril medication side effects in mychart thanks

## 2023-06-17 NOTE — Telephone Encounter (Signed)
 Calling to see when her next treatment is..she left 2 messages

## 2023-06-17 NOTE — Telephone Encounter (Signed)
 Received a refill request for PT-faxed over to [facility]

## 2023-06-17 NOTE — Telephone Encounter (Signed)
 Pt called to have appointment scheduled for BCG treatment Pt scheduled for 04/04

## 2023-06-17 NOTE — Telephone Encounter (Signed)
 Received refill request for flexeril 5mg  but its not on her med list. Will ask provider to reorder if patient was supposed to continue as the last note stated:  Former [facility] pt. Will route to work in provider  Will start low dose nortriptyline and PRN flexeril for her jaw pain.

## 2023-06-17 NOTE — Telephone Encounter (Signed)
 Pharmacy Patient Advocate Encounter   Received notification from Fax that prior authorization for Cyclobenzaprine 5mg  Tablet is required/requested.   Insurance verification completed.   The patient is insured through [place] .   Per test claim: The current 30 day co-pay is, $0.  No PA needed at this time. This test claim was processed through [facility]- copay amounts may vary at other pharmacies due to pharmacy/plan contracts, or as the patient moves through the different stages of their insurance plan.

## 2023-06-18 ENCOUNTER — Ambulatory Visit (INDEPENDENT_AMBULATORY_CARE_PROVIDER_SITE_OTHER): Admitting: Urology

## 2023-06-18 ENCOUNTER — Telehealth: Payer: Self-pay

## 2023-06-18 ENCOUNTER — Ambulatory Visit: Admitting: Urology

## 2023-06-18 VITALS — BP 145/84 | HR 91

## 2023-06-18 DIAGNOSIS — C672 Malignant neoplasm of lateral wall of bladder: Secondary | ICD-10-CM

## 2023-06-18 MED ORDER — BCG LIVE 50 MG IS SUSR
3.2400 mL | Freq: Once | INTRAVESICAL | Status: AC
  Administered 2023-06-18: 81 mg via INTRAVESICAL

## 2023-06-18 NOTE — Progress Notes (Signed)
 BCG Bladder Instillation  BCG # 4 of 6  Due to Bladder Cancer, patient is present today for BCG treatment. The patient was cleaned and prepped in a sterile fashion with Betadinex3 A 14FR catheter was inserted, urine return was noted 50ml, urine was Dark yellowin color.  50ml of reconstituted BCG was then instilled into the bladder. The catheter was then removed. Patient tolerated well, no complications were noted The patient given and discussed with BCG education attached to the AVS.   Performed by: Gwendolyn Grant T. CMA  Follow up-1week with NP [place]

## 2023-06-18 NOTE — Telephone Encounter (Signed)
 Patient called to confirmed appointment with no answer.  Message left making patient of appointment for today.

## 2023-06-22 ENCOUNTER — Telehealth: Payer: Self-pay | Admitting: Urology

## 2023-06-22 NOTE — Telephone Encounter (Signed)
 Patient called this afternoon she is congested coughing up mucus and her eyes are running.  She would like Dr Ronne Binning to call her in some antibiotics, she can't get into see Dr Margo Aye until the 16th of April.  Please advise.

## 2023-06-22 NOTE — Telephone Encounter (Signed)
 Patient called and state's she has allergy.  Patient is coughing up yellow mucus.

## 2023-06-23 NOTE — Telephone Encounter (Signed)
 Patient is made aware and voiced understanding.

## 2023-06-25 ENCOUNTER — Encounter: Payer: Self-pay | Admitting: Urology

## 2023-06-25 ENCOUNTER — Ambulatory Visit: Admitting: Urology

## 2023-06-25 VITALS — BP 139/97 | HR 86

## 2023-06-25 DIAGNOSIS — C672 Malignant neoplasm of lateral wall of bladder: Secondary | ICD-10-CM

## 2023-06-25 MED ORDER — BCG LIVE 50 MG IS SUSR
3.2400 mL | Freq: Once | INTRAVESICAL | Status: AC
  Administered 2023-06-25: 81 mg via INTRAVESICAL

## 2023-06-25 NOTE — Progress Notes (Signed)
 BCG Bladder Instillation  BCG # 5 of 6  Due to Bladder Cancer, patient is present today for BCG treatment. The patient was cleaned and prepped in a sterile fashion with Betadinex3 A 14 FR catheter was inserted, urine return was noted 10 ml, urine was Clear yellowin color.  50ml of reconstituted BCG was then instilled into the bladder. The catheter was then removed. Patient tolerated well, no complications were noted The patient given and discussed with BCG education attached to the AVS.   Performed by: Kennyth Lose, CMA  Follow up-1week with Evette Georges, FNP  Additional comments- NA

## 2023-06-25 NOTE — Patient Instructions (Signed)
 Patient Education: Bacillus Calmette-Guerin (BCG) Bladder Instillations for Bladder Cancer  What it is: BCG is a vaccine which is used to prevent tuberculosis (TB).  But it's also a helpful treatment for some early bladder cancers.  When BCG goes directly into the bladder the treatment is described as intravesical. BCG is a type of immunotherapy.  Immunotherapy stimulates the body's immune system to destroy cancer cells.  BCG treatment is administered in an outpatient setting.  It takes a few minutes to administer and you can go home as soon as it's finished.  It might be a good idea to ask someone to bring you, particularly the fist time.  Before Instillation Please be on time for your instillation treatment and prepared to provide a urine specimen to check for infection prior to treatment. Your treatment plan will be modified if you are unwell or have an infection in your urine on a scheduled BCG instillation day. Inform your doctor if you have felt feverish, tired or had chills since your last treatment or if you have been urinating any bright red blood before your instillation. Empty your bladder just before the instillation and give urine specimen to the nurse to check.  During Instillation The liquid medication will be instilled into your bladder through a catheter (a small tube). In most cases, the catheter will be removed from the bladder after the instillation is completed. The medication should be retained (held) in your bladder for 2 hours to obtain the best results.  After Instillation  Immediately after:  - Go straight home after procedure to avoid possibly having to use a public restroom.  - Hold your urine for two hours after the BCG instillation. You should change positions several times once you get home. Ideally, you should reposition from left side to right side and also lie upon your back and your abdomen. Change these positions every 15 minutes to maximize bladder  exposure to the medication for one hour. This can seem strange or difficult, however it is necessary to give the treatment adequate time and contact with all bladder surfaces to work effectively.  After 2 hours:  - Use the toilet to urinate and empty your bladder as completely as possible. - Avoid urine splashing on the toilet seat or getting on your hands. It might be easer for men to sit down if they typically stand when urinating at an ordinary toilet. - After urinating, pour two (2) cups of household bleach (Clorox or equivalent) into the toilet and let sit for 15-20 minutes before flushing. - Wash your hands and genital area thoroughly after you urinate. - Drink plenty of fluids after your instillation to flush your bladder.  Repeat the above process each time you urinate for six (6) hours after each BCG treatment. This is necessary because BCG is a live vaccine and other people shouldn't be exposed to it.  For 2 days (48 hours) after treatment: A condom should be used on males during sex for the first 48 hours after they or their partner under BCG treatment to prevent unintentional contact with any BCG which may be present in the patient's semen or vaginal fluid.  Side effects (common): - Needing to pass urine often - Pain when you pass urine - Blood in urine - Flu-like symptoms (tiredness, general aching and raised temperature)  These side effects should improve within 1-2 days. Drinking lots of water can help flush the drug out of your bladder and reduce some of these effects. Taking  Ibuprofen or Aleve is encouraged unless you have a condition that would make these medications unsafe to take (such as kidney disease, diabetes, etc.).  Side effects which warrant urgent evaluation - Call the Urology office or go to ER if the following occur: New onset of cough, chills, night sweats, fever over 100.64F, joint pain Heavy persistent blood in the urine (not improving over time) Severe  bladder spasms Inability to urinate  Follow up plan: Bladder cancer has a strong tendency to come back throughout your lifetime. It is extremely important that you keep your BCG appointment and have lifelong follow up with your urologist to look in the bladder periodically and check the urine for cancer cells.  Contraception / Water quality scientist / Unintended pregnancy prevention: It is unknown if or how BCG may affect a developing fetus so it is not advisable to become pregnant or to father a child while undergoing BCG instillations.  It is important to use effective contraception during BCG treatment and for six weeks afterwards.   [facility]  [address] [place], [place] [address] Phone: [phone]

## 2023-06-25 NOTE — Progress Notes (Unsigned)
 Name: [name] DOB: [birth year] MRN: [id number]  History of Present Illness: [name] is a 73 y.o. female who presents today for follow up visit at [facility] [place].  GU History includes: 1. Bladder cancer. - 04/29/2023: Underwent TURBT by Dr. Ronne Binning. 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   Today: BCG Bladder Instillation #5 of 6 She presents today for a BCG treatment for bladder cancer management.   She denies acute urinary symptoms. She denies fevers. She denies flank pain or abdominal pain.  Medications: Current Outpatient Medications  Medication Sig Dispense Refill   aspirin 81 MG chewable tablet Chew 81 mg by mouth daily.     azaTHIOprine (IMURAN) 50 MG tablet Take 1 tablet (50 mg total) by mouth daily. 90 tablet 3   cyclobenzaprine (FLEXERIL) 5 MG tablet Take 1 tablet (5 mg total) by mouth at bedtime as needed for muscle spasms. Or jaw pain. 30 tablet 1   escitalopram (LEXAPRO) 5 MG tablet Take 1 tablet (5 mg total) by mouth at bedtime. 30 tablet 2   furosemide (LASIX) 40 MG tablet Take 40 mg by mouth daily as needed for edema.     HYDROcodone-acetaminophen (NORCO) 10-325 MG tablet Take 1 tablet by mouth every 6 (six) hours as needed for moderate pain (pain score 4-6). 120 tablet 0   lisinopril (ZESTRIL) 5 MG tablet Take 5 mg by mouth daily.     OVER THE COUNTER MEDICATION Vit D 3 2000 daily.     pantoprazole (PROTONIX) 40 MG tablet Take 1 tablet (40 mg total) by mouth daily before breakfast. 90 tablet 3   ursodiol (URSO FORTE) 500 MG tablet Take 1 tablet (500 mg total) by mouth 2 (two) times daily. 180 tablet 3   Vibegron (GEMTESA) 75 MG TABS Take 1 tablet (75 mg total) by mouth daily.     No current facility-administered medications for this visit.    Allergies: Allergies  Allergen Reactions   Gabapentin Nausea And Vomiting    Other Reaction(s): Not available   Prednisone Palpitations    Other  Reaction(s): Not available   Carvedilol     Stopped due to bradycardia   Clonidine Derivatives     Stopped due to bradycardia    Diltiazem     Stopped due to bradycardia    Tape Other (See Comments)    Causes blisters to form   Latex Other (See Comments) and Rash    Blisters  Other Reaction(s): Not available    Past Medical History:  Diagnosis Date   Anxiety    Arthritis    "hands, arms, back, neck, knees, fingers" (01/21/2016)   Bradycardia    Breast cancer, left breast (HCC) 1970s; 2015   CAD (coronary artery disease)    a. s/p NSTEMI in 2017 with DES to OM and unsuccessful PCI to RCA with wire dissection b. DES to RCA in 2021   Cancer of skin of leg    BLE   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HCC)    left   Chronic lower back pain    Chronic pain    COPD (chronic obstructive pulmonary disease) (HCC)    Coronary artery disease    Depression    GERD (gastroesophageal reflux disease)    Heart murmur    Hepatic cirrhosis (HCC) 12/20/2017   Hepatitis    History of blood transfusion    "related to spleen"   History of hiatal hernia    History of kidney stones  Hyperlipidemia    Hypertension    Hypothyroidism    Long term prescription benzodiazepine use 12/10/2022   Melanoma of forearm, left (HCC)    Migraine    "a few migraines/year" (01/21/2016)   Obesity    Polypharmacy 12/10/2022   Stroke (HCC) 04/2003   2 carotid artery stents in place; small left parietal and left hemispheric CVA.[name] 01/21/2016   Tobacco use disorder    50 pack years; questionably discontinued in 2010   Wears glasses    Past Surgical History:  Procedure Laterality Date   ABDOMINAL HYSTERECTOMY  1978   APPENDECTOMY     BIOPSY  04/28/2022   Procedure: BIOPSY;  Surgeon: Dolores Frame, MD;  [facility]: [facility];  Service: Gastroenterology;;   BLADDER INSTILLATION N/A 04/29/2023   Procedure: BLADDER INSTILLATION- gemcitabine;  Surgeon: Malen Gauze, MD;  Location: [facility];   Service: Urology;  Laterality: N/A;   BREAST BIOPSY Left 1970s; 2015   BREAST LUMPECTOMY Left 1970s   BREAST LUMPECTOMY WITH NEEDLE LOCALIZATION Left 07/07/2013   Procedure: BREAST LUMPECTOMY WITH NEEDLE LOCALIZATION;  Surgeon: Robyne Askew, MD;  Location: [facility];  Service: General;  Laterality: Left;   CARDIAC CATHETERIZATION N/A 01/22/2016   Procedure: Left Heart Cath and Coronary Angiography;  Surgeon: Peter M Swaziland, MD;  Location: [facility] INVASIVE CV LAB;  Service: Cardiovascular;  Laterality: N/A;   CARDIAC CATHETERIZATION N/A 01/22/2016   Procedure: Coronary Stent Intervention;  Surgeon: Peter M Swaziland, MD;  Location: [facility] INVASIVE CV LAB;  Service: Cardiovascular;  Laterality: N/A;   CAROTID STENT Left 04/2003   small left parietal and left hemispheric CVA/notes 07/29/2010   CHOLECYSTECTOMY OPEN     COLONOSCOPY  2011   COLONOSCOPY N/A 05/10/2019   Procedure: COLONOSCOPY;  Surgeon: Malissa Hippo, MD;  [facility];  Service: Endoscopy;  Laterality: N/A;  1:45   COLONOSCOPY WITH PROPOFOL N/A 09/01/2022   Procedure: COLONOSCOPY WITH PROPOFOL;  Surgeon: Dolores Frame, MD;  [facility];  Service: Gastroenterology;  Laterality: N/A;  1:45PM;ASA 3   CORONARY ATHERECTOMY N/A 12/29/2019   Procedure: CORONARY ATHERECTOMY;  Surgeon: Corky Crafts, MD;  Location: [facility] INVASIVE CV LAB;  Service: Cardiovascular;  Laterality: N/A;  Prox RCA   CORONARY STENT INTERVENTION N/A 12/29/2019   Procedure: CORONARY STENT INTERVENTION;  Surgeon: Corky Crafts, MD;  Location: [facility] INVASIVE CV LAB;  Service: Cardiovascular;  Laterality: N/A;  Prox RCA   CORONARY ULTRASOUND/IVUS N/A 12/29/2019   Procedure: Intravascular Ultrasound/IVUS;  Surgeon: Corky Crafts, MD;  Location: [facility] INVASIVE CV LAB;  Service: Cardiovascular;  Laterality: N/A;   CYSTOSCOPY WITH BIOPSY N/A 04/29/2023   Procedure: CYSTOSCOPY WITH BIOPSY;  Surgeon: Malen Gauze,  MD;  Location: [facility];  Service: Urology;  Laterality: N/A;   ESOPHAGEAL DILATION N/A 04/28/2022   Procedure: ESOPHAGEAL DILATION;  Surgeon: Dolores Frame, MD;  [facility];  Service: Gastroenterology;  Laterality: N/A;   ESOPHAGOGASTRODUODENOSCOPY  08/06/2011   Procedure: ESOPHAGOGASTRODUODENOSCOPY (EGD);  Surgeon: Malissa Hippo, MD;  [facility];  Service: Endoscopy;  Laterality: N/A;   ESOPHAGOGASTRODUODENOSCOPY N/A 05/10/2019   Procedure: ESOPHAGOGASTRODUODENOSCOPY (EGD);  Surgeon: Malissa Hippo, MD;  [facility];  Service: Endoscopy;  Laterality: N/A;   ESOPHAGOGASTRODUODENOSCOPY (EGD) WITH PROPOFOL N/A 04/28/2022   Procedure: ESOPHAGOGASTRODUODENOSCOPY (EGD) WITH PROPOFOL;  Surgeon: Dolores Frame, MD;  [facility];  Service: Gastroenterology;  Laterality: N/A;  10:30 am, pt  can't move up due to transportation   FLEXIBLE SIGMOIDOSCOPY  08/06/2011   Procedure: FLEXIBLE SIGMOIDOSCOPY;  Surgeon: Malissa Hippo, MD;  [facility];  Service: Endoscopy;  Laterality: N/A;   HEMOSTASIS CLIP PLACEMENT  09/01/2022   Procedure: HEMOSTASIS CLIP PLACEMENT;  Surgeon: Dolores Frame, MD;  [facility];  Service: Gastroenterology;;   INGUINAL HERNIA REPAIR Left 1970s   IR ANGIO INTRA EXTRACRAN SEL COM CAROTID INNOMINATE BILAT MOD SED  11/01/2020   IR ANGIO VERTEBRAL SEL VERTEBRAL BILAT MOD SED  11/01/2020   IR RADIOLOGIST EVAL & MGMT  09/23/2020   IR US GUIDE VASC ACCESS RIGHT  11/01/2020   LARYNX SURGERY  1970s   Polyps excised   LEFT HEART CATH AND CORONARY ANGIOGRAPHY N/A 12/29/2019   Procedure: LEFT HEART CATH AND CORONARY ANGIOGRAPHY;  Surgeon: Corky Crafts, MD;  Location: [facility] INVASIVE CV LAB;  Service: Cardiovascular;  Laterality: N/A;   LEFT HEART CATH AND CORONARY ANGIOGRAPHY N/A 10/12/2022   Procedure: LEFT HEART CATH AND CORONARY ANGIOGRAPHY;  Surgeon: Corky Crafts, MD;   Location: [facility] INVASIVE CV LAB;  Service: Cardiovascular;  Laterality: N/A;   MALONEY DILATION  05/10/2019   Procedure: [name] DILATION;  Surgeon: Malissa Hippo, MD;  [facility];  Service: Endoscopy;;   MELANOMA EXCISION Left ~ 2016   forearm   POLYPECTOMY  05/10/2019   Procedure: POLYPECTOMY;  Surgeon: Malissa Hippo, MD;  [facility];  Service: Endoscopy;;  colon   POLYPECTOMY  09/01/2022   Procedure: POLYPECTOMY INTESTINAL;  Surgeon: Dolores Frame, MD;  [facility];  Service: Gastroenterology;;   SPLENECTOMY, 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   spontaneous rupture   SPLENECTOMY, TOTAL     SUBMUCOSAL LIFTING INJECTION  09/01/2022   Procedure: SUBMUCOSAL LIFTING INJECTION;  Surgeon: Dolores Frame, MD;  [facility];  Service: Gastroenterology;;   SUBMUCOSAL TATTOO INJECTION  09/01/2022   Procedure: SUBMUCOSAL TATTOO INJECTION;  Surgeon: Dolores Frame, MD;  [facility];  Service: Gastroenterology;;   TEMPORARY PACEMAKER N/A 12/29/2019   Procedure: TEMPORARY PACEMAKER;  Surgeon: Corky Crafts, MD;  Location: [facility] INVASIVE CV LAB;  Service: Cardiovascular;  Laterality: N/A;   TRANSURETHRAL RESECTION OF BLADDER TUMOR N/A 03/15/2023   Procedure: TRANSURETHRAL RESECTION OF BLADDER TUMOR (TURBT)with Gemcitibane;  Surgeon: Malen Gauze, MD;  Location: [facility];  Service: Urology;  Laterality: N/A;   TRANSURETHRAL RESECTION OF BLADDER TUMOR N/A 04/29/2023   Procedure: TRANSURETHRAL RESECTION OF BLADDER TUMOR (TURBT);  Surgeon: Malen Gauze, MD;  Location: [facility];  Service: Urology;  Laterality: N/A;   Family History  Problem Relation Age of Onset   Heart failure Mother    Cancer Father    Heart failure Father    Cancer Sister    Dementia Sister    Heart disease Other    Arthritis Other    Cancer Other    Diabetes Other    Kidney disease Other    Cancer Sister    Heart failure Brother     Social History   Socioeconomic History   Marital status: Divorced    Spouse name: Not on file   Number of children: 2   Years of education: Not on file   Highest education level: Not on file  Occupational History   Occupation: Disabled   Occupation: retired  Tobacco Use   Smoking status: Some Days    Current packs/day: 0.50    Average  packs/day: 0.5 packs/day for 40.0 years (20.0 ttl pk-yrs)    Types: Cigarettes    Passive exposure: Current   Smokeless tobacco: Never   Tobacco comments:    Trying to quit with use of Chantix and calm as of 04/13/2023 but restarted in February with 2 cigarettes per week.   Vaping Use   Vaping status: Never Used  Substance and Sexual Activity   Alcohol use: Not Currently   Drug use: Never   Sexual activity: Not Currently  Other Topics Concern   Not on file  Social History Narrative   ** Merged History Encounter **       Right handed   Wears glasses    Drinks 1 cup of coffee daily   Drinks sweet tea twice a week.   Social Drivers of Corporate investment banker Strain: Low Risk  (12/15/2022)   Overall Financial Resource Strain (CARDIA)    Difficulty of Paying Living Expenses: Not hard at all  Food Insecurity: No Food Insecurity (12/15/2022)   Hunger Vital Sign    Worried About Running Out of Food in the Last Year: Never true    Ran Out of Food in the Last Year: Never true  Transportation Needs: No Transportation Needs (12/15/2022)   PRAPARE - Administrator, Civil Service (Medical): No    Lack of Transportation (Non-Medical): No  Physical Activity: Sufficiently Active (12/15/2022)   Exercise Vital Sign    Days of Exercise per Week: 7 days    Minutes of Exercise per Session: 30 min  Stress: Stress Concern Present (12/15/2022)   [employer] of Occupational Health - Occupational Stress Questionnaire    Feeling of Stress : To some extent  Social Connections: Moderately Isolated (12/15/2022)   Social Connection and  Isolation Panel [NHANES]    Frequency of Communication with Friends and Family: More than three times a week    Frequency of Social Gatherings with Friends and Family: More than three times a week    Attends Religious Services: More than 4 times per year    Active Member of [employer] or Organizations: No    Attends Banker Meetings: Never    Marital Status: Divorced  Catering manager Violence: Not At Risk (12/15/2022)   Humiliation, Afraid, Rape, and Kick questionnaire    Fear of Current or Ex-Partner: No    Emotionally Abused: No    Physically Abused: No    Sexually Abused: No    Review of Systems Constitutional: Patient denies any unintentional weight loss or change in strength lntegumentary: Patient denies any rashes or pruritus Cardiovascular: Patient denies chest pain or syncope Respiratory: Patient denies shortness of breath Musculoskeletal: Patient denies muscle cramps or weakness Neurologic: Patient denies convulsions or seizures  GU: As per HPI.  OBJECTIVE There were no vitals filed for this visit. There is no height or weight on file to calculate BMI.  Physical Examination Constitutional: No obvious distress; patient is non-toxic appearing  Cardiovascular: No visible lower extremity edema.  Respiratory: The patient does not have audible wheezing/stridor; respirations do not appear labored  Gastrointestinal: Abdomen non-distended Musculoskeletal: Normal ROM of UEs  Skin: No obvious rashes/open sores  Neurologic: CN 2-12 grossly intact Psychiatric: Answered questions appropriately with normal affect  Hematologic/Lymphatic/Immunologic: No obvious bruises or sites of spontaneous bleeding  UA: no evidence of UTI  ASSESSMENT Malignant neoplasm of lateral wall of urinary bladder (HCC) - Plan: POCT urinalysis dipstick  She underwent BCG instillation #5 of 6 today; tolerated  well.  Patient remained in clinic for 30 minutes following instillation today for  monitoring of hypersensitivity reaction; none noted.   Post-instillation instructions were reviewed; written instructions provided.  Next surveillance cystoscopy is scheduled on 11/03/2023 with Dr. Ronne Binning.  Will plan for follow up in one week for next BCG instillation. Patient verbalized understanding of and agreement with current plan. All questions were answered.  PLAN Advised the following: 1. Return in about 1 week (around 07/02/2023) for f/u with BCG instillation.  Orders Placed This Encounter  Procedures   POCT urinalysis dipstick    It has been explained that the patient is to follow regularly with their PCP in addition to all other providers involved in their care and to follow instructions provided by these respective offices. Patient advised to contact urology clinic if any urologic-pertaining questions, concerns, new symptoms or problems arise in the interim period.  Patient Instructions    Patient Education: Bacillus Calmette-Guerin (BCG) Bladder Instillations for Bladder Cancer  What it is: BCG is a vaccine which is used to prevent tuberculosis (TB).  But it's also a helpful treatment for some early bladder cancers.  When BCG goes directly into the bladder the treatment is described as intravesical. BCG is a type of immunotherapy.  Immunotherapy stimulates the body's immune system to destroy cancer cells.  BCG treatment is administered in an outpatient setting.  It takes a few minutes to administer and you can go home as soon as it's finished.  It might be a good idea to ask someone to bring you, particularly the fist time.  Before Instillation Please be on time for your instillation treatment and prepared to provide a urine specimen to check for infection prior to treatment. Your treatment plan will be modified if you are unwell or have an infection in your urine on a scheduled BCG instillation day. Inform your doctor if you have felt feverish, tired or had chills since  your last treatment or if you have been urinating any bright red blood before your instillation. Empty your bladder just before the instillation and give urine specimen to the nurse to check.  During Instillation The liquid medication will be instilled into your bladder through a catheter (a small tube). In most cases, the catheter will be removed from the bladder after the instillation is completed. The medication should be retained (held) in your bladder for 2 hours to obtain the best results.  After Instillation  Immediately after:  - Go straight home after procedure to avoid possibly having to use a public restroom.  - Hold your urine for two hours after the BCG instillation. You should change positions several times once you get home. Ideally, you should reposition from left side to right side and also lie upon your back and your abdomen. Change these positions every 15 minutes to maximize bladder exposure to the medication for one hour. This can seem strange or difficult, however it is necessary to give the treatment adequate time and contact with all bladder surfaces to work effectively.  After 2 hours:  - Use the toilet to urinate and empty your bladder as completely as possible. - Avoid urine splashing on the toilet seat or getting on your hands. It might be easer for men to sit down if they typically stand when urinating at an ordinary toilet. - After urinating, pour two (2) cups of household bleach (Clorox or equivalent) into the toilet and let sit for 15-20 minutes before flushing. - Wash your hands and genital  area thoroughly after you urinate. - Drink plenty of fluids after your instillation to flush your bladder.  Repeat the above process each time you urinate for six (6) hours after each BCG treatment. This is necessary because BCG is a live vaccine and other people shouldn't be exposed to it.  For 2 days (48 hours) after treatment: A condom should be used on males during sex  for the first 48 hours after they or their partner under BCG treatment to prevent unintentional contact with any BCG which may be present in the patient's semen or vaginal fluid.  Side effects (common): - Needing to pass urine often - Pain when you pass urine - Blood in urine - Flu-like symptoms (tiredness, general aching and raised temperature)  These side effects should improve within 1-2 days. Drinking lots of water can help flush the drug out of your bladder and reduce some of these effects. Taking Ibuprofen or Aleve is encouraged unless you have a condition that would make these medications unsafe to take (such as kidney disease, diabetes, etc.).  Side effects which warrant urgent evaluation - Call the Urology office or go to ER if the following occur: New onset of cough, chills, night sweats, fever over 100.37F, joint pain Heavy persistent blood in the urine (not improving over time) Severe bladder spasms Inability to urinate  Follow up plan: Bladder cancer has a strong tendency to come back throughout your lifetime. It is extremely important that you keep your BCG appointment and have lifelong follow up with your urologist to look in the bladder periodically and check the urine for cancer cells.  Contraception / Water quality scientist / Unintended pregnancy prevention: It is unknown if or how BCG may affect a developing fetus so it is not advisable to become pregnant or to father a child while undergoing BCG instillations.  It is important to use effective contraception during BCG treatment and for six weeks afterwards.   [facility] [address] [place], [place] [address] Phone: [phone]   Electronically signed by:  Donnita Falls, FNP   06/25/23    2:06 PM

## 2023-06-29 ENCOUNTER — Telehealth (HOSPITAL_COMMUNITY): Admitting: Psychiatry

## 2023-07-01 LAB — POCT URINALYSIS DIPSTICK
Bilirubin, UA: NEGATIVE
Blood, UA: NEGATIVE
Glucose, UA: NEGATIVE
Leukocytes, UA: NEGATIVE
Nitrite, UA: NEGATIVE
Protein, UA: POSITIVE — AB
Spec Grav, UA: >=1.03 — AB (ref 1.010–1.025)
Urobilinogen, UA: 1 U/dL
pH, UA: 6 (ref 5.0–8.0)

## 2023-07-02 ENCOUNTER — Ambulatory Visit (INDEPENDENT_AMBULATORY_CARE_PROVIDER_SITE_OTHER): Admitting: Urology

## 2023-07-02 ENCOUNTER — Encounter: Payer: Self-pay | Admitting: Urology

## 2023-07-02 VITALS — BP 163/79 | HR 76 | Temp 97.6°F

## 2023-07-02 DIAGNOSIS — C672 Malignant neoplasm of lateral wall of bladder: Secondary | ICD-10-CM

## 2023-07-02 LAB — URINALYSIS, ROUTINE W REFLEX MICROSCOPIC
Bilirubin, UA: NEGATIVE
Ketones, UA: NEGATIVE
Leukocytes,UA: NEGATIVE
Nitrite, UA: NEGATIVE
Specific Gravity, UA: 1.03 (ref 1.005–1.030)
Urobilinogen, Ur: 1 mg/dL (ref 0.2–1.0)
pH, UA: 6 (ref 5.0–7.5)

## 2023-07-02 MED ORDER — BCG LIVE 50 MG IS SUSR
3.2400 mL | Freq: Once | INTRAVESICAL | Status: AC
  Administered 2023-07-02: 81 mg via INTRAVESICAL

## 2023-07-02 MED ORDER — CIPROFLOXACIN HCL 500 MG PO TABS
500.0000 mg | ORAL_TABLET | Freq: Once | ORAL | Status: AC
  Administered 2023-07-02: 500 mg via ORAL

## 2023-07-02 NOTE — Patient Instructions (Signed)
 Patient Education: Bacillus Calmette-Guerin (BCG) Bladder Instillations for Bladder Cancer  What it is: BCG is a vaccine which is used to prevent tuberculosis (TB).  But it's also a helpful treatment for some early bladder cancers.  When BCG goes directly into the bladder the treatment is described as intravesical. BCG is a type of immunotherapy.  Immunotherapy stimulates the body's immune system to destroy cancer cells.  BCG treatment is administered in an outpatient setting.  It takes a few minutes to administer and you can go home as soon as it's finished.  It might be a good idea to ask someone to bring you, particularly the fist time.  Before Instillation Please be on time for your instillation treatment and prepared to provide a urine specimen to check for infection prior to treatment. Your treatment plan will be modified if you are unwell or have an infection in your urine on a scheduled BCG instillation day. Inform your doctor if you have felt feverish, tired or had chills since your last treatment or if you have been urinating any bright red blood before your instillation. Empty your bladder just before the instillation and give urine specimen to the nurse to check.  During Instillation The liquid medication will be instilled into your bladder through a catheter (a small tube). In most cases, the catheter will be removed from the bladder after the instillation is completed. The medication should be retained (held) in your bladder for 2 hours to obtain the best results.  After Instillation  Immediately after:  - Go straight home after procedure to avoid possibly having to use a public restroom.  - Hold your urine for two hours after the BCG instillation. You should change positions several times once you get home. Ideally, you should reposition from left side to right side and also lie upon your back and your abdomen. Change these positions every 15 minutes to maximize bladder  exposure to the medication for one hour. This can seem strange or difficult, however it is necessary to give the treatment adequate time and contact with all bladder surfaces to work effectively.  After 2 hours:  - Use the toilet to urinate and empty your bladder as completely as possible. - Avoid urine splashing on the toilet seat or getting on your hands. It might be easer for men to sit down if they typically stand when urinating at an ordinary toilet. - After urinating, pour two (2) cups of household bleach (Clorox or equivalent) into the toilet and let sit for 15-20 minutes before flushing. - Wash your hands and genital area thoroughly after you urinate. - Drink plenty of fluids after your instillation to flush your bladder.  Repeat the above process each time you urinate for six (6) hours after each BCG treatment. This is necessary because BCG is a live vaccine and other people shouldn't be exposed to it.  For 2 days (48 hours) after treatment: A condom should be used on males during sex for the first 48 hours after they or their partner under BCG treatment to prevent unintentional contact with any BCG which may be present in the patient's semen or vaginal fluid.  Side effects (common): - Needing to pass urine often - Pain when you pass urine - Blood in urine - Flu-like symptoms (tiredness, general aching and raised temperature)  These side effects should improve within 1-2 days. Drinking lots of water can help flush the drug out of your bladder and reduce some of these effects. Taking  Ibuprofen or Aleve is encouraged unless you have a condition that would make these medications unsafe to take (such as kidney disease, diabetes, etc.).  Side effects which warrant urgent evaluation - Call the Urology office or go to ER if the following occur: New onset of cough, chills, night sweats, fever over 100.64F, joint pain Heavy persistent blood in the urine (not improving over time) Severe  bladder spasms Inability to urinate  Follow up plan: Bladder cancer has a strong tendency to come back throughout your lifetime. It is extremely important that you keep your BCG appointment and have lifelong follow up with your urologist to look in the bladder periodically and check the urine for cancer cells.  Contraception / Water quality scientist / Unintended pregnancy prevention: It is unknown if or how BCG may affect a developing fetus so it is not advisable to become pregnant or to father a child while undergoing BCG instillations.  It is important to use effective contraception during BCG treatment and for six weeks afterwards.   [facility]  [address] [place], [place] [address] Phone: [phone]

## 2023-07-02 NOTE — Progress Notes (Signed)
 Name: [name] DOB: 25-Oct-1950 MRN: [id number]  History of Present Illness: [name] is a 73 y.o. female who presents today for follow up visit at [facility] [place].  GU History includes: 1. Bladder cancer. - 04/29/2023: Underwent TURBT by Dr. Sherrilee. 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   Today: BCG Bladder Instillation #6 of 6 She presents today for a BCG treatment for bladder cancer management.   She denies acute urinary symptoms. She denies fevers. Does report feeling somewhat weak today. She denies flank pain or abdominal pain.  Medications: Current Outpatient Medications  Medication Sig Dispense Refill   aspirin  81 MG chewable tablet Chew 81 mg by mouth daily.     azaTHIOprine  (IMURAN ) 50 MG tablet Take 1 tablet (50 mg total) by mouth daily. 90 tablet 3   cyclobenzaprine  (FLEXERIL ) 5 MG tablet Take 1 tablet (5 mg total) by mouth at bedtime as needed for muscle spasms. Or jaw pain. 30 tablet 1   escitalopram  (LEXAPRO ) 5 MG tablet Take 1 tablet (5 mg total) by mouth at bedtime. 30 tablet 2   furosemide  (LASIX ) 40 MG tablet Take 40 mg by mouth daily as needed for edema.     HYDROcodone -acetaminophen  (NORCO) 10-325 MG tablet Take 1 tablet by mouth every 6 (six) hours as needed for moderate pain (pain score 4-6). 120 tablet 0   lisinopril  (ZESTRIL ) 5 MG tablet Take 5 mg by mouth daily.     OVER THE COUNTER MEDICATION Vit D 3 2000 daily.     pantoprazole  (PROTONIX ) 40 MG tablet Take 1 tablet (40 mg total) by mouth daily before breakfast. 90 tablet 3   ursodiol  (URSO  FORTE) 500 MG tablet Take 1 tablet (500 mg total) by mouth 2 (two) times daily. 180 tablet 3   Vibegron  (GEMTESA ) 75 MG TABS Take 1 tablet (75 mg total) by mouth daily.     No current facility-administered medications for this visit.    Allergies: Allergies  Allergen Reactions   Gabapentin  Nausea And Vomiting    Other Reaction(s): Not available    Prednisone  Palpitations    Other Reaction(s): Not available   Carvedilol      Stopped due to bradycardia   Clonidine Derivatives     Stopped due to bradycardia    Diltiazem      Stopped due to bradycardia    Tape Other (See Comments)    Causes blisters to form   Latex Other (See Comments) and Rash    Blisters  Other Reaction(s): Not available    Past Medical History:  Diagnosis Date   Anxiety    Arthritis    hands, arms, back, neck, knees, fingers (01/21/2016)   Bradycardia    Breast cancer, left breast (HCC) 1970s; 2015   CAD (coronary artery disease)    a. s/p NSTEMI in 2017 with DES to OM and unsuccessful PCI to RCA with wire dissection b. 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   History of hiatal hernia  History of kidney stones    Hyperlipidemia    Hypertension    Hypothyroidism    Long term prescription benzodiazepine use 12/10/2022   Melanoma of forearm, left (HCC)    Migraine    a few migraines/year (01/21/2016)   Obesity    Polypharmacy 12/10/2022   Stroke (HCC) 04/2003   2 carotid artery stents in place; small left parietal and left hemispheric CVA.hazeline 01/21/2016   Tobacco use disorder    50 pack years; questionably discontinued in 2010   Wears glasses    Past Surgical History:  Procedure Laterality Date   ABDOMINAL HYSTERECTOMY  1978   APPENDECTOMY     BIOPSY  04/28/2022   Procedure: BIOPSY;  Surgeon: Eartha Angelia Sieving, MD;  [facility];  Service: Gastroenterology;;   BLADDER INSTILLATION N/A 04/29/2023   Procedure: BLADDER INSTILLATION- gemcitabine ;  Surgeon: Sherrilee Belvie CROME, MD;  Location: [facility];  Service: Urology;  Laterality: N/A;   BREAST BIOPSY Left 1970s; 2015   BREAST LUMPECTOMY Left 1970s   BREAST LUMPECTOMY WITH NEEDLE LOCALIZATION Left 07/07/2013   Procedure: BREAST LUMPECTOMY WITH NEEDLE LOCALIZATION;  Surgeon: Deward GORMAN Curvin DOUGLAS, MD;  Location: [facility];  Service: General;  Laterality: Left;   CARDIAC CATHETERIZATION N/A 01/22/2016   Procedure: Left Heart Cath and Coronary Angiography;  Surgeon: Peter M Jordan, MD;  Location: [facility] INVASIVE CV LAB;  Service: Cardiovascular;  Laterality: N/A;   CARDIAC CATHETERIZATION N/A 01/22/2016   Procedure: Coronary Stent Intervention;  Surgeon: Peter M Jordan, MD;  Location: [facility] INVAS[facility] CV LAB;  Service: Cardiovascular;  Laterality: N/A;   CAROTID STENT Left 04/2003   small left parietal and left hemispheric CVA/notes 07/29/2010   CHOLECYSTECTOMY OPEN     COLONOSCOPY  2011   COLONOSCOPY N/A 05/10/2019   Procedure: COLONOSCOPY;  Surgeon: Golda Claudis PENNER, MD;  [facility];  Service: Endoscopy;  Laterality: N/A;  1:45   COLONOSCOPY WITH PROPOFOL  N/A 09/01/2022   Procedure: COLONOSCOPY WITH PROPOFOL ;  Surgeon: Eartha Angelia Sieving, MD;  [facility];  Service: Gastroenterology;  Laterality: N/A;  1:45PM;ASA 3   CORONARY ATHERECTOMY N/A 12/29/2019   Procedure: CORONARY ATHERECTOMY;  Surgeon: Dann Candyce GORMAN, MD;  Location: [facility] INVASIVE CV LAB;  Service: Cardiovascular;  Laterality: N/A;  Prox RCA   CORONARY STENT INTERVENTION N/A 12/29/2019   Procedure: CORONARY STENT INTERVENTION;  Surgeon: Dann Candyce GORMAN, MD;  Location: [facility] INVASIVE CV LAB;  Service: Cardiovascular;  Laterality: N/A;  Prox RCA   CORONARY ULTRASOUND/IVUS N/A 12/29/2019   Procedure: Intravascular Ultrasound/IVUS;  Surgeon: Dann Candyce GORMAN, MD;  Location: [facility] INVASIVE CV LAB;  Service: Cardiovascular;  Laterality: N/A;   CYSTOSCOPY WITH BIOPSY N/A 04/29/2023   Procedure: CYSTOSCOPY WITH  BIOPSY;  Surgeon: Sherrilee Belvie CROME, MD;  Location: [facility];  Service: Urology;  Laterality: N/A;   ESOPHAGEAL DILATION N/A 04/28/2022   Procedure: ESOPHAGEAL DILATION;  Surgeon: Eartha Angelia Sieving, MD;  [facility];  Service: Gastroenterology;  Laterality: N/A;   ESOPHAGOGASTRODUODENOSCOPY  08/06/2011   Procedure: ESOPHAGOGASTRODUODENOSCOPY (EGD);  Surgeon: Claudis PENNER Golda, MD;  [facility];  Service: Endoscopy;  Laterality: N/A;   ESOPHAGOGASTRODUODENOSCOPY N/A 05/10/2019   Procedure: ESOPHAGOGASTRODUODENOSCOPY (EGD);  Surgeon: Golda Claudis PENNER, MD;  [facility];  Service: Endoscopy;  Laterality: N/A;   ESOPHAGOGASTRODUODENOSCOPY (EGD) WITH PROPOFOL  N/A 04/28/2022   Procedure: ESOPHAGOGASTRODUODENOSCOPY (EGD) WITH PROPOFOL ;  Surgeon: Eartha Angelia Sieving, MD;  [facility];  Service: Gastroenterology;  Laterality: N/A;  10:30 am, pt can't move up due to transportation   FLEXIBLE SIGMOIDOSCOPY  08/06/2011   Procedure: FLEXIBLE SIGMOIDOSCOPY;  Surgeon: Claudis RAYMOND Rivet, MD;  [facility];  Service: Endoscopy;  Laterality: N/A;   HEMOSTASIS CLIP PLACEMENT  09/01/2022   Procedure: HEMOSTASIS CLIP PLACEMENT;  Surgeon: Eartha Angelia Sieving, MD;  [facility];  Service: Gastroenterology;;   INGUINAL HERNIA REPAIR Left 1970s   IR ANGIO INTRA EXTRACRAN SEL COM CAROTID INNOMINATE BILAT MOD SED  11/01/2020   IR ANGIO VERTEBRAL SEL VERTEBRAL BILAT MOD SED  11/01/2020   IR RADIOLOGIST EVAL & MGMT  09/23/2020   IR US  GUIDE VASC ACCESS RIGHT  11/01/2020   LARYNX SURGERY  1970s   Polyps excised   LEFT HEART CATH AND CORONARY ANGIOGRAPHY N/A 12/29/2019   Procedure: LEFT HEART CATH AND CORONARY ANGIOGRAPHY;  Surgeon: Dann Candyce RAMAN, MD;  Location: [facility] INVASIVE CV LAB;  Service: Cardiovascular;  Laterality: N/A;   LEFT HEART CATH AND CORONARY ANGIOGRAPHY N/A 10/12/2022   Procedure: LEFT HEART CATH AND CORONARY  ANGIOGRAPHY;  Surgeon: Dann Candyce RAMAN, MD;  Location: [facility] INVASIVE CV LAB;  Service: Cardiovascular;  Laterality: N/A;   MALONEY DILATION  05/10/2019   Procedure: AGAPITO DILATION;  Surgeon: Rivet Claudis RAYMOND, MD;  [facility];  Service: Endoscopy;;   MELANOMA EXCISION Left ~ 2016   forearm   POLYPECTOMY  05/10/2019   Procedure: POLYPECTOMY;  Surgeon: Rivet Claudis RAYMOND, MD;  [facility];  Service: Endoscopy;;  colon   POLYPECTOMY  09/01/2022   Procedure: POLYPECTOMY INTESTINAL;  Surgeon: Eartha Angelia Sieving, MD;  [facility];  Service: Gastroenterology;;   SPLENECTOMY, 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   spontaneous rupture   SPLENECTOMY, TOTAL     SUBMUCOSAL LIFTING INJECTION  09/01/2022   Procedure: SUBMUCOSAL LIFTING INJECTION;  Surgeon: Eartha Angelia Sieving, MD;  [facility];  Service: Gastroenterology;;   SUBMUCOSAL TATTOO INJECTION  09/01/2022   Procedure: SUBMUCOSAL TATTOO INJECTION;  Surgeon: Eartha Angelia Sieving, MD;  [facility];  Service: Gastroenterology;;   TEMPORARY PACEMAKER N/A 12/29/2019   Procedure: TEMPORARY PACEMAKER;  Surgeon: Dann Candyce RAMAN, MD;  Location: [facility] INVASIVE CV LAB;  Service: Cardiovascular;  Laterality: N/A;   TRANSURETHRAL RESECTION OF BLADDER TUMOR N/A 03/15/2023   Procedure: TRANSURETHRAL RESECTION OF BLADDER TUMOR (TURBT)with Gemcitibane;  Surgeon: Sherrilee Belvie CROME, MD;  Location: [facility];  Service: Urology;  Laterality: N/A;   TRANSURETHRAL RESECTION OF BLADDER TUMOR N/A 04/29/2023   Procedure: TRANSURETHRAL RESECTION OF BLADDER TUMOR (TURBT);  Surgeon: Sherrilee Belvie CROME, MD;  Location: [facility];  Service: Urology;  Laterality: N/A;   Family History  Problem Relation Age of Onset   Heart failure Mother    Cancer Father    Heart failure Father    Cancer Sister    Dementia Sister    Heart disease Other    Arthritis Other    Cancer Other    Diabetes Other    Kidney disease Other     Cancer Sister    Heart failure Brother    Social History   Socioeconomic History   Marital status: Divorced    Spouse name: Not on file   Number of children: 2   Years of education: Not on file   Highest education level: Not on file  Occupational History   Occupation: Disabled   Occupation: retired  Tobacco Use   Smoking status: Some Days    Current  packs/day: 0.50    Average packs/day: 0.5 packs/day for 40.0 years (20.0 ttl pk-yrs)    Types: Cigarettes    Passive exposure: Current   Smokeless tobacco: Never   Tobacco comments:    Trying to quit with use of Chantix and calm as of 04/13/2023 but restarted in February with 2 cigarettes per week.   Vaping Use   Vaping status: Never Used  Substance and Sexual Activity   Alcohol use: Not Currently   Drug use: Never   Sexual activity: Not Currently  Other Topics Concern   Not on file  Social History Narrative   ** Merged History Encounter **       Right handed   Wears glasses    Drinks 1 cup of coffee daily   Drinks sweet tea twice a week.   Social Drivers of Corporate Investment Banker Strain: Low Risk  (12/15/2022)   Overall Financial Resource Strain (CARDIA)    Difficulty of Paying Living Expenses: Not hard at all  Food Insecurity: No Food Insecurity (12/15/2022)   Hunger Vital Sign    Worried About Running Out of Food in the Last Year: Never true    Ran Out of Food in the Last Year: Never true  Transportation Needs: No Transportation Needs (12/15/2022)   PRAPARE - Administrator, Civil Service (Medical): No    Lack of Transportation (Non-Medical): No  Physical Activity: Sufficiently Active (12/15/2022)   Exercise Vital Sign    Days of Exercise per Week: 7 days    Minutes of Exercise per Session: 30 min  Stress: Stress Concern Present (12/15/2022)   [employer] of Occupational Health - Occupational Stress Questionnaire    Feeling of Stress : To some extent  Social Connections: Moderately Isolated  (12/15/2022)   Social Connection and Isolation Panel [NHANES]    Frequency of Communication with Friends and Family: More than three times a week    Frequency of Social Gatherings with Friends and Family: More than three times a week    Attends Religious Services: More than 4 times per year    Active Member of [employer] or Organizations: No    Attends Banker Meetings: Never    Marital Status: Divorced  Catering Manager Violence: Not At Risk (12/15/2022)   Humiliation, Afraid, Rape, and Kick questionnaire    Fear of Current or Ex-Partner: No    Emotionally Abused: No    Physically Abused: No    Sexually Abused: No    Review of Systems Constitutional: Patient denies any unintentional weight loss or change in strength lntegumentary: Patient denies any rashes or pruritus Cardiovascular: Patient denies chest pain or syncope Respiratory: Patient denies shortness of breath Musculoskeletal: Patient denies muscle cramps or weakness Neurologic: Patient reports feeling mildly dizzy  GU: As per HPI.  OBJECTIVE Vitals:   07/02/23 0938  BP: (!) 163/79  Pulse: 76  Temp: 97.6 F (36.4 C)   There is no height or weight on file to calculate BMI.  Physical Examination Constitutional: No obvious distress; patient is non-toxic appearing  Cardiovascular: No visible lower extremity edema.  Respiratory: The patient does not have audible wheezing/stridor; respirations do not appear labored  Gastrointestinal: Abdomen non-distended Musculoskeletal: Normal ROM of UEs  Skin: No obvious rashes/open sores  Neurologic: CN 2-12 grossly intact Psychiatric: Answered questions appropriately with normal affect  Hematologic/Lymphatic/Immunologic: No obvious bruises or sites of spontaneous bleeding  UA: no evidence of UTI  ASSESSMENT Malignant neoplasm of lateral wall of  urinary bladder (HCC) - Plan: Urinalysis, Routine w reflex microscopic, bcg vaccine injection 81 mg, In and Out Cath,  ciprofloxacin  (CIPRO ) tablet 500 mg  She underwent BCG instillation #6 of 6 today; tolerated well.   Patient remained in clinic for 30 minutes following instillation today for monitoring of hypersensitivity reaction; none noted. She was given a bottle of water  and encouraged to sit / rest after her visit until she felt strong enough to leave.  Post-instillation instructions were reviewed; written instructions provided.  Will plan for follow up with surveillance cystoscopy as scheduled on 11/03/2023 with Dr. Sherrilee.  Patient verbalized understanding of and agreement with current plan. All questions were answered.  PLAN Advised the following: 1. Return in 4 months (on 11/03/2023) for as previously scheduled for cystoscopy with Dr. Sherrilee.  Orders Placed This Encounter  Procedures   Urinalysis, Routine w reflex microscopic   In and Out Cath    It has been explained that the patient is to follow regularly with their PCP in addition to all other providers involved in their care and to follow instructions provided by these respective offices. Patient advised to contact urology clinic if any urologic-pertaining questions, concerns, new symptoms or problems arise in the interim period.  Patient Instructions    Patient Education: Bacillus Calmette-Guerin (BCG) Bladder Instillations for Bladder Cancer  What it is: BCG is a vaccine which is used to prevent tuberculosis (TB).  But it's also a helpful treatment for some early bladder cancers.  When BCG goes directly into the bladder the treatment is described as intravesical. BCG is a type of immunotherapy.  Immunotherapy stimulates the body's immune system to destroy cancer cells.  BCG treatment is administered in an outpatient setting.  It takes a few minutes to administer and you can go home as soon as it's finished.  It might be a good idea to ask someone to bring you, particularly the fist time.  Before Instillation Please be on time for  your instillation treatment and prepared to provide a urine specimen to check for infection prior to treatment. Your treatment plan will be modified if you are unwell or have an infection in your urine on a scheduled BCG instillation day. Inform your doctor if you have felt feverish, tired or had chills since your last treatment or if you have been urinating any bright red blood before your instillation. Empty your bladder just before the instillation and give urine specimen to the nurse to check.  During Instillation The liquid medication will be instilled into your bladder through a catheter (a small tube). In most cases, the catheter will be removed from the bladder after the instillation is completed. The medication should be retained (held) in your bladder for 2 hours to obtain the best results.  After Instillation  Immediately after:  - Go straight home after procedure to avoid possibly having to use a public restroom.  - Hold your urine for two hours after the BCG instillation. You should change positions several times once you get home. Ideally, you should reposition from left side to right side and also lie upon your back and your abdomen. Change these positions every 15 minutes to maximize bladder exposure to the medication for one hour. This can seem strange or difficult, however it is necessary to give the treatment adequate time and contact with all bladder surfaces to work effectively.  After 2 hours:  - Use the toilet to urinate and empty your bladder as completely as possible. -  Avoid urine splashing on the toilet seat or getting on your hands. It might be easer for men to sit down if they typically stand when urinating at an ordinary toilet. - After urinating, pour two (2) cups of household bleach (Clorox or equivalent) into the toilet and let sit for 15-20 minutes before flushing. - Wash your hands and genital area thoroughly after you urinate. - Drink plenty of fluids after  your instillation to flush your bladder.  Repeat the above process each time you urinate for six (6) hours after each BCG treatment. This is necessary because BCG is a live vaccine and other people shouldn't be exposed to it.  For 2 days (48 hours) after treatment: A condom should be used on males during sex for the first 48 hours after they or their partner under BCG treatment to prevent unintentional contact with any BCG which may be present in the patient's semen or vaginal fluid.  Side effects (common): - Needing to pass urine often - Pain when you pass urine - Blood in urine - Flu-like symptoms (tiredness, general aching and raised temperature)  These side effects should improve within 1-2 days. Drinking lots of water  can help flush the drug out of your bladder and reduce some of these effects. Taking Ibuprofen or Aleve is encouraged unless you have a condition that would make these medications unsafe to take (such as kidney disease, diabetes, etc.).  Side effects which warrant urgent evaluation - Call the Urology office or go to ER if the following occur: New onset of cough, chills, night sweats, fever over 100.68F, joint pain Heavy persistent blood in the urine (not improving over time) Severe bladder spasms Inability to urinate  Follow up plan: Bladder cancer has a strong tendency to come back throughout your lifetime. It is extremely important that you keep your BCG appointment and have lifelong follow up with your urologist to look in the bladder periodically and check the urine for cancer cells.  Contraception / Water Quality Scientist / Unintended pregnancy prevention: It is unknown if or how BCG may affect a developing fetus so it is not advisable to become pregnant or to father a child while undergoing BCG instillations.  It is important to use effective contraception during BCG treatment and for six weeks afterwards.   [facility] [place] [address] [place], [place] [address] Phone: [phone]   Electronically signed by:  Lauraine JAYSON Oz, FNP   07/02/23    9:58 AM

## 2023-07-02 NOTE — Progress Notes (Signed)
 BCG Bladder Instillation  BCG # 6 of 6  Due to Bladder Cancer, patient is present today for BCG treatment. The patient was cleaned and prepped in a sterile fashion with Betadinex3 A 14FR catheter was inserted, urine return was noted 10ml, urine was Dark yellowin color.  50ml of reconstituted BCG was then instilled into the bladder. The catheter was then removed. Patient tolerated well, no complications were noted The patient given and discussed with BCG education attached to the AVS.   Performed by: Gillian Lacrosse T. CMA  Follow up- As scheduled   Additional comments-

## 2023-07-04 ENCOUNTER — Encounter: Payer: Self-pay | Admitting: Urology

## 2023-07-04 NOTE — Progress Notes (Signed)
 06/18/2023 11:19 AM   PRAGYA LOFASO 08-02-1950 [id number]  Referring provider: Omie Bickers, MD [address] [place],  [place] [address]  Bladder cancer   HPI: Ms Feeley is a 73yo here for followup for bladder cancer. She is currently undergoing BCG therapy and is tolerating it well. No significant LUTS.    PMH: Past Medical History:  Diagnosis Date   Anxiety    Arthritis    "hands, arms, back, neck, knees, fingers" (01/21/2016)   Bradycardia    Breast cancer, left breast (HCC) 1970s; 2015   CAD (coronary artery disease)    a. s/p NSTEMI in 2017 with DES to OM and unsuccessful PCI to RCA with wire dissection b. 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   Melanoma of forearm, left (HCC)    Migraine    "a few migraines/year" (01/21/2016)   Obesity    Polypharmacy 12/10/2022   Stroke (HCC) 04/2003   2 carotid artery stents in place; small left parietal and left hemispheric CVA.Sam Creighton 01/21/2016   Tobacco use disorder    50 pack years; questionably discontinued in 2010   Wears glasses     Surgical History: Past Surgical History:  Procedure Laterality Date   ABDOMINAL HYSTERECTOMY  1978   APPENDECTOMY     BIOPSY  04/28/2022   Procedure: BIOPSY;  Surgeon: Urban Garden, MD;  [facility];  Service: Gastroenterology;;   BLADDER INSTILLATION N/A 04/29/2023   Procedure: BLADDER INSTILLATION- gemcitabine ;  Surgeon: Marco Severs, MD;  Location: [facility];  Service: Urology;  Laterality: N/A;   BREAST BIOPSY Left 1970s; 2015   BREAST LUMPECTOMY Left 1970s   BREAST LUMPECTOMY WITH NEEDLE LOCALIZATION Left 07/07/2013   Procedure: BREAST LUMPECTOMY WITH NEEDLE LOCALIZATION;  Surgeon: Mayme Spearman, MD;  Location: [facility];  Service: General;  Laterality: Left;   CARDIAC CATHETERIZATION N/A 01/22/2016   Procedure: Left Heart Cath and Coronary Angiography;  Surgeon: Peter M Swaziland, MD;  Location: [facility] INVASIVE CV LAB;  Service: Cardiovascular;  Laterality: N/A;   CARDIAC CATHETERIZATION N/A 01/22/2016   Procedure: Coronary Stent Intervention;  Surgeon: Peter M Swaziland, MD;  Location: [facility] INVASIVE CV LAB;  Service: Cardiovascular;  Laterality: N/A;   CAROTID STENT Left 04/2003   small left parietal and left hemispheric CVA/notes 07/29/2010   CHOLECYSTECTOMY OPEN     COLONOSCOPY  2011   COLONOSCOPY N/A 05/10/2019   Procedure: COLONOSCOPY;  Surgeon: Ruby Corporal, MD;  [facility];  Service: Endoscopy;  Laterality: N/A;  1:45   COLONOSCOPY WITH PROPOFOL  N/A 09/01/2022   Procedure: COLONOSCOPY WITH PROPOFOL ;  Surgeon: Urban Garden, MD;  [facility];  Service: Gastroenterology;  Laterality: N/A;  1:45PM;ASA 3   CORONARY ATHERECTOMY N/A 12/29/2019  Procedure: CORONARY ATHERECTOMY;  Surgeon: Lucendia Rusk, MD;  Location: [facility] INVASIVE CV LAB;  Service: Cardiovascular;  Laterality: N/A;  Prox RCA   CORONARY STENT INTERVENTION N/A 12/29/2019   Procedure: CORONARY STENT INTERVENTION;  Surgeon: Lucendia Rusk, MD;  Location: [facility] INVASIVE CV LAB;  Service: Cardiovascular;  Laterality: N/A;  Prox RCA   CORONARY ULTRASOUND/IVUS N/A 12/29/2019   Procedure: Intravascular Ultrasound/IVUS;  Surgeon: Lucendia Rusk, MD;  Location: [facility] INVASIVE CV LAB;  Service: Cardiovascular;  Laterality: N/A;   CYSTOSCOPY WITH BIOPSY N/A 04/29/2023   Procedure: CYSTOSCOPY WITH  BIOPSY;  Surgeon: Marco Severs, MD;  Location: [facility];  Service: Urology;  Laterality: N/A;   ESOPHAGEAL DILATION N/A 04/28/2022   Procedure: ESOPHAGEAL DILATION;  Surgeon: Urban Garden, MD;  [facility];  Service: Gastroenterology;  Laterality: N/A;   ESOPHAGOGASTRODUODENOSCOPY  08/06/2011   Procedure: ESOPHAGOGASTRODUODENOSCOPY (EGD);  Surgeon: Ruby Corporal, MD;  [facility];  Service: Endoscopy;  Laterality: N/A;   ESOPHAGOGASTRODUODENOSCOPY N/A 05/10/2019   Procedure: ESOPHAGOGASTRODUODENOSCOPY (EGD);  Surgeon: Ruby Corporal, MD;  [facility];  Service: Endoscopy;  Laterality: N/A;   ESOPHAGOGASTRODUODENOSCOPY (EGD) WITH PROPOFOL  N/A 04/28/2022   Procedure: ESOPHAGOGASTRODUODENOSCOPY (EGD) WITH PROPOFOL ;  Surgeon: Urban Garden, MD;  [facility];  Service: Gastroenterology;  Laterality: N/A;  10:30 am, pt can't move up due to transportation   FLEXIBLE SIGMOIDOSCOPY  08/06/2011   Procedure: FLEXIBLE SIGMOIDOSCOPY;  Surgeon: Ruby Corporal, MD;  [facility];  Service: Endoscopy;  Laterality: N/A;   HEMOSTASIS CLIP PLACEMENT  09/01/2022   Procedure: HEMOSTASIS CLIP PLACEMENT;  Surgeon: Urban Garden, MD;  [facility];  Service: Gastroenterology;;   INGUINAL HERNIA REPAIR Left 1970s   IR ANGIO INTRA EXTRACRAN SEL COM CAROTID INNOMINATE BILAT MOD SED  11/01/2020   IR ANGIO VERTEBRAL SEL VERTEBRAL BILAT MOD SED  11/01/2020   IR RADIOLOGIST EVAL & MGMT  09/23/2020   IR US  GUIDE VASC ACCESS RIGHT  11/01/2020   LARYNX SURGERY  1970s   Polyps excised   LEFT HEART CATH AND CORONARY ANGIOGRAPHY N/A 12/29/2019   Procedure: LEFT HEART CATH AND CORONARY ANGIOGRAPHY;  Surgeon: Lucendia Rusk, MD;  Location: [facility]VASIVE CV LAB;  Service: Cardiovascular;  Laterality: N/A;   LEFT HEART CATH AND CORONARY ANGIOGRAPHY N/A 10/12/2022   Procedure: LEFT HEART CATH AND CORONARY  ANGIOGRAPHY;  Surgeon: Lucendia Rusk, MD;  Location: [facility] INVASIVE CV LAB;  Service: Cardiovascular;  Laterality: N/A;   MALONEY DILATION  05/10/2019   Procedure: Londa Rival DILATION;  Surgeon: Ruby Corporal, MD;  [facility];  Service: Endoscopy;;   MELANOMA EXCISION Left ~ 2016   forearm   POLYPECTOMY  05/10/2019   Procedure: POLYPECTOMY;  Surgeon: Ruby Corporal, MD;  [facility];  Service: Endoscopy;;  colon   POLYPECTOMY  09/01/2022   Procedure: POLYPECTOMY INTESTINAL;  Surgeon: Urban Garden, MD;  [facility];  Service: Gastroenterology;;   SPLENECTOMY, 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   spontaneous rupture   SPLENECTOMY, TOTAL     SUBMUCOSAL LIFTING INJECTION  09/01/2022   Procedure: SUBMUCOSAL LIFTING INJECTION;  Surgeon: Urban Garden, MD;  [facility];  Service: Gastroenterology;;   SUBMUCOSAL TATTOO INJECTION  09/01/2022   Procedure: SUBMUCOSAL TATTOO INJECTION;  Surgeon: Urban Garden, MD;  [facility];  Service: Gastroenterology;;   TEMPORARY PACEMAKER N/A 12/29/2019   Procedure: TEMPORARY PACEMAKER;  Surgeon: Lucendia Rusk, MD;  Location: [facility] INVASIVE CV LAB;  Service: Cardiovascular;  Laterality: N/A;   TRANSURETHRAL RESECTION OF BLADDER TUMOR N/A 03/15/2023   Procedure: TRANSURETHRAL RESECTION OF BLADDER TUMOR (TURBT)with Gemcitibane;  Surgeon: Marco Severs, MD;  Location: [facility];  Service: Urology;  Laterality: N/A;   TRANSURETHRAL RESECTION OF BLADDER TUMOR N/A 04/29/2023   Procedure: TRANSURETHRAL RESECTION OF BLADDER TUMOR (TURBT);  Surgeon: Marco Severs, MD;  Location: [facility];  Service: Urology;  Laterality: N/A;    Home Medications:  Allergies as of 06/18/2023       Reactions   Gabapentin  Nausea And Vomiting   Other Reaction(s): Not available   Prednisone  Palpitations   Other Reaction(s): Not available   Carvedilol     Stopped due to bradycardia   Clonidine  Derivatives    Stopped due to bradycardia   Diltiazem     Stopped due to bradycardia   Tape Other (See Comments)   Causes blisters to form   Latex Other (See Comments), Rash   Blisters Other Reaction(s): Not available        Medication List        Accurate as of June 18, 2023 11:59 PM. If you have any questions, ask your nurse or doctor.          aspirin  81 MG chewable tablet Chew 81 mg by mouth daily.   azaTHIOprine  50 MG tablet Commonly known as: IMURAN  Take 1 tablet (50 mg total) by mouth daily.   cyclobenzaprine  5 MG tablet Commonly known as: FLEXERIL  Take 1 tablet (5 mg total) by mouth at bedtime as needed for muscle spasms. Or jaw pain.   escitalopram  5 MG tablet Commonly known as: LEXAPRO  Take 1 tablet (5 mg total) by mouth at bedtime.   furosemide  40 MG tablet Commonly known as: LASIX  Take 40 mg by mouth daily as needed for edema.   Gemtesa  75 MG Tabs Generic drug: Vibegron  Take 1 tablet (75 mg total) by mouth daily.   HYDROcodone -acetaminophen  10-325 MG tablet Commonly known as: NORCO Take 1 tablet by mouth every 6 (six) hours as needed for moderate pain (pain score 4-6).   lisinopril  5 MG tablet Commonly known as: ZESTRIL  Take 5 mg by mouth daily.   OVER THE COUNTER MEDICATION Vit D 3 2000 daily.   pantoprazole  40 MG tablet Commonly known as: PROTONIX  Take 1 tablet (40 mg total) by mouth daily before breakfast.   ursodiol  500 MG tablet Commonly known as: Urso  Forte Take 1 tablet (500 mg total) by mouth 2 (two) times daily.        Allergies:  Allergies  Allergen Reactions   Gabapentin  Nausea And Vomiting    Other Reaction(s): Not available   Prednisone  Palpitations    Other Reaction(s): Not available   Carvedilol      Stopped due to bradycardia   Clonidine Derivatives     Stopped due to bradycardia    Diltiazem      Stopped due to bradycardia    Tape Other (See Comments)    Causes blisters to form   Latex Other (See Comments)  and Rash    Blisters  Other Reaction(s): Not available    Family History: Family History  Problem Relation Age of Onset   Heart failure Mother    Cancer Father    Heart failure Father    Cancer Sister    Dementia Sister    Heart disease Other    Arthritis Other    Cancer Other  Diabetes Other    Kidney disease Other    Cancer Sister    Heart failure Brother     Social History:  reports that she has been smoking cigarettes. She has a 20 pack-year smoking history. She has been exposed to tobacco smoke. She has never used smokeless tobacco. She reports that she does not currently use alcohol. She reports that she does not use drugs.  ROS: All other review of systems were reviewed and are negative except what is noted above in HPI  Physical Exam: BP (!) 145/84   Pulse 91   Constitutional:  Alert and oriented, No acute distress. HEENT: [place] AT, moist mucus membranes.  Trachea midline, no masses. Cardiovascular: No clubbing, cyanosis, or edema. Respiratory: Normal respiratory effort, no increased work of breathing. GI: Abdomen is soft, nontender, nondistended, no abdominal masses GU: No CVA tenderness.  Lymph: No cervical or inguinal lymphadenopathy. Skin: No rashes, bruises or suspicious lesions. Neurologic: Grossly intact, no focal deficits, moving all 4 extremities. Psychiatric: Normal mood and affect.  Laboratory Data: Lab Results  Component Value Date   WBC 12.8 (H) 02/24/2023   HGB 13.5 02/24/2023   HCT 41.7 02/24/2023   MCV 90.5 02/24/2023   PLT 312 02/24/2023    Lab Results  Component Value Date   CREATININE 0.63 02/24/2023    No results found for: "PSA"  No results found for: "TESTOSTERONE"  Lab Results  Component Value Date   HGBA1C 7.1 (H) 10/19/2022    Urinalysis    Component Value Date/Time   COLORURINE RED (A) 02/02/2023 1156   APPEARANCEUR Clear 07/02/2023 0930   LABSPEC  02/02/2023 1156    TEST NOT REPORTED DUE TO COLOR INTERFERENCE OF  URINE PIGMENT   PHURINE  02/02/2023 1156    TEST NOT REPORTED DUE TO COLOR INTERFERENCE OF URINE PIGMENT   GLUCOSEU 2+ (A) 07/02/2023 0930   HGBUR (A) 02/02/2023 1156    TEST NOT REPORTED DUE TO COLOR INTERFERENCE OF URINE PIGMENT   BILIRUBINUR Negative 07/02/2023 0930   KETONESUR (A) 02/02/2023 1156    TEST NOT REPORTED DUE TO COLOR INTERFERENCE OF URINE PIGMENT   PROTEINUR 1+ (A) 07/02/2023 0930   PROTEINUR (A) 02/02/2023 1156    TEST NOT REPORTED DUE TO COLOR INTERFERENCE OF URINE PIGMENT   UROBILINOGEN 1.0 07/01/2023 1121   UROBILINOGEN 0.2 11/03/2014 1722   NITRITE Negative 07/02/2023 0930   NITRITE (A) 02/02/2023 1156    TEST NOT REPORTED DUE TO COLOR INTERFERENCE OF URINE PIGMENT   LEUKOCYTESUR Negative 07/02/2023 0930   LEUKOCYTESUR (A) 02/02/2023 1156    TEST NOT REPORTED DUE TO COLOR INTERFERENCE OF URINE PIGMENT    Lab Results  Component Value Date   LABMICR Comment 07/02/2023   WBCUA 6-10 (A) 05/14/2023   LABEPIT CANCELED 06/10/2023   BACTERIA Moderate (A) 05/14/2023    Pertinent Imaging:  No results found for this or any previous visit.  Results for orders placed during the hospital encounter of 10/05/07  US  VenoUS Imaging Bilateral  Narrative Clinical Data: Left leg pain and swelling  VENOUS DUPLEX ULTRASOUND OF BILATERAL LOWER EXTREMITIES  Technique: Gray-scale sonography with graded compression, as well as color Doppler and duplex ultrasound, were performed to evaluate the deep venous system of both lower extremities from the level of the common femoral vein through the popliteal and proximal calf veins. Spectral Doppler was utilized to evaulate flow at rest and with distal augmentation maneuvers. Confirmed with physician's office that bilateral lower extremity exam requested.  Comparison: None  Findings: Deep venous system patent and compressible from the groin through popliteal fossa bilaterally.Aaron Aas Spontaneous venous flow present with intact  augmentation and evidence of respiratory phasicity. No intraluminal thrombus identified. Visualized portions of the greater saphenous systems unremarkable.  IMPRESSION: No evidence of deep venous thrombosis.  Provider: Carolyne Citizen  No results found for this or any previous visit.  No results found for this or any previous visit.  Results for orders placed during the hospital encounter of 02/02/23  US  Renal  Narrative CLINICAL DATA:  Painless hematuria  EXAM: RENAL / URINARY TRACT ULTRASOUND COMPLETE  COMPARISON:  CT renal stone 01/31/2023  FINDINGS: Right Kidney:  Renal measurements: 12.7 x 4.4 x 5.7 cm = volume: 161 mL. Echogenicity within normal limits. No mass or hydronephrosis visualized.  Left Kidney:  Renal measurements: 11.5 x 6.4 x 5.3 cm = volume: 205 mL. Echogenicity within normal limits. No mass or hydronephrosis visualized.  Bladder:  Under distended and not well evaluated.  Other:  None.  IMPRESSION: Normal renal ultrasound.   Electronically Signed By: Tyron Gallon M.D. On: 02/02/2023 18:00  No results found for this or any previous visit.  Results for orders placed in visit on 02/03/23  CT HEMATURIA WORKUP  Narrative CLINICAL DATA:  Gross hematuria.  EXAM: CT ABDOMEN AND PELVIS WITHOUT AND WITH CONTRAST  TECHNIQUE: Multidetector CT imaging of the abdomen and pelvis was performed following the standard protocol before and following the bolus administration of intravenous contrast.  RADIATION DOSE REDUCTION: This exam was performed according to the departmental dose-optimization program which includes automated exposure control, adjustment of the mA and/or kV according to patient size and/or use of iterative reconstruction technique.  CONTRAST:  OMNIPAQUE  IOHEXOL  300 MG/ML  SOLN  COMPARISON:  CT renal stone protocol from 01/31/2023.  FINDINGS: Lower chest: There is a spiculated marginated 1.6 x 2.1 cm nodule  in the right lung lower lobe, which was present but partially imaged on the recent prior exam. There is also a new pleural-based 8 x 14 mm nodule in the right lung lower lobe. Findings are concerning for neoplastic process. Further evaluation with dedicated contrast-enhanced chest CT scan is recommended. The lung bases are otherwise clear. No pleural effusion or consolidation. The heart is normal in size. No pericardial effusion.  Hepatobiliary: The liver is normal in size. Non-cirrhotic configuration. No suspicious mass. No intrahepatic bile duct dilation. There is moderate dilation of the extrahepatic bile duct, most likely due to post cholecystectomy status. Gallbladder is surgically absent.  Pancreas: Unremarkable. No pancreatic ductal dilatation or surrounding inflammatory changes.  Spleen: Surgically absent. There is an accessory splenule in the left upper quadrant. There are also multiple smaller hyperattenuating nodules along the left diaphragmatic undersurface, also favored to represent splenules.  Adrenals/Urinary Tract: Adrenal glands are unremarkable.  Non-contrast images: No radiopaque urinary tract calculi.  Kidneys: Symmetric enhancement. No suspicious renal mass.  Urinary Tract Opacification: Adequate.  Collecting Systems and Ureters: No filling defects, masses, strictures, or areas of abnormal dilatation.  Urinary Bladder: Is decompressed precluding optimal assessment. However, note is made of mild bladder wall mucosal hyperenhancement and subtle perivesical fat stranding, concerning for cystitis. Correlate clinically and with urinalysis. No focal mass or bladder calculi.  Stomach/Bowel: No disproportionate dilation of the small or large bowel loops. No evidence of abnormal bowel wall thickening or inflammatory changes. The appendix was not visualized; however there is no acute inflammatory process in the right lower quadrant.  Vascular/Lymphatic: No  ascites or  pneumoperitoneum. No abdominal or pelvic lymphadenopathy, by size criteria. No aneurysmal dilation of the major abdominal arteries. There are moderate peripheral atherosclerotic vascular calcifications of the aorta and its major branches.  Reproductive: The uterus is surgically absent. No large adnexal mass.  Other: There is a tiny fat containing umbilical hernia. The soft tissues and abdominal wall are otherwise unremarkable.  Musculoskeletal: No suspicious osseous lesions. There are mild multilevel degenerative changes in the visualized spine.  IMPRESSION: *No nephroureterolithiasis or obstructive uropathy. No suspicious renal, ureteric or urinary bladder mass. Mild urinary bladder wall mucosal hyperenhancement and subtle perivesical fat stranding, concerning for cystitis. Correlate clinically and with urinalysis. *There is a spiculated marginated 1.6 x 2.1 cm nodule in the right lung lower lobe and an additional pleural-based 8 x 14 mm nodule in the right lung lower lobe, which is new since the recent prior exam. Findings are concerning for neoplastic process. Further evaluation with dedicated contrast-enhanced chest CT scan is recommended, unless recently performed. *Multiple other nonacute observations, as described above.  Aortic Atherosclerosis (ICD10-I70.0).   Electronically Signed By: Beula Brunswick M.D. On: 02/23/2023 14:38  Results for orders placed during the hospital encounter of 01/31/23  CT Renal Stone Study  Narrative CLINICAL DATA:  Lower abdominal and flank pain.  Gross hematuria.  EXAM: CT ABDOMEN AND PELVIS WITHOUT CONTRAST  TECHNIQUE: Multidetector CT imaging of the abdomen and pelvis was performed following the standard protocol without IV contrast.  RADIATION DOSE REDUCTION: This exam was performed according to the departmental dose-optimization program which includes automated exposure control, adjustment of the mA and/or kV  according to patient size and/or use of iterative reconstruction technique.  COMPARISON:  08/04/2022  FINDINGS: Lower chest: Pulmonary nodule is seen in the medial right lower lobe, which is incompletely visualized but measures at least 13 mm in diameter. A few other tiny less than 5 mm pulmonary nodules also seen in the right lower lobe.  Hepatobiliary: No mass visualized on this unenhanced exam. Prior cholecystectomy again noted. Dilated common bile duct is unchanged, and without significant intrahepatic biliary ductal dilatation.  Pancreas: No mass or inflammatory process visualized on this unenhanced exam.  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  Adrenals/Urinary tract: No evidence of urolithiasis or hydronephrosis. Unremarkable unopacified urinary bladder.  Stomach/Bowel: No evidence of obstruction, inflammatory process, or abnormal fluid collections. Diverticulosis is seen mainly involving the sigmoid colon, however there is no evidence of diverticulitis.  Vascular/Lymphatic: No pathologically enlarged lymph nodes identified. No evidence of abdominal aortic aneurysm.  Reproductive: Prior hysterectomy noted. Adnexal regions are unremarkable in appearance.  Other:  None.  Musculoskeletal:  No suspicious bone lesions identified.  IMPRESSION: No evidence of urolithiasis, hydronephrosis, or other acute findings.  Colonic diverticulosis, without radiographic evidence of diverticulitis.  Several right lower lobe nodules, largest is incompletely visualized but measures at least 13 mm. Chest CT is recommended for further evaluation.   Electronically Signed By: Marlyce Sine M.D. On: 01/31/2023 17:03   Assessment & Plan:    1. Malignant neoplasm of lateral wall of urinary bladder (HCC) (Primary) Continue BCG - bcg vaccine injection 81 mg - In and Out Cath   No follow-ups on file.  Johnie Nailer,  MD  [facility] [place]

## 2023-07-04 NOTE — Patient Instructions (Signed)
 Bladder Cancer  Bladder cancer is a condition where abnormal tissue (a tumor) grows in the bladder. The bladder is the organ that holds urine. Two tubes (ureters) carry urine from the kidneys to the bladder. The bladder wall is made of layers of tissue. Cancer that spreads through these layers of the bladder wall becomes more difficult to treat. What increases the risk? The following factors may make you more likely to develop this condition: Smoking. Working where there are risks (occupational exposures), such as working with Animal nutritionist, Administrator, clothing fabric, dyes, chemicals, or paint. Being 42 years of age or older. Being female. Having long-term bladder inflammation. Having a history of cancer. This includes: A family history of bladder cancer. Having had bladder cancer before. Having had certain treatments for cancer before, such as: Medicines to kill cancer cells (chemotherapy). Strong X-ray beams or high-energy capsules to kill cancer cells and shrink tumors (radiation therapy). Having been exposed to arsenic. This is a poisonous substance. What are the signs or symptoms? Early symptoms of this condition include: Blood in your urine. Pain when urinating. Infections of your urinary system (urinary tract infections or UTIs) that happen often. Having to urinate sooner or more often than normal. Late symptoms of this condition include: Not being able to urinate. Pain on one side of your lower back. Loss of appetite. Weight loss. Tiredness (fatigue). Swelling in your feet. Bone pain. How is this diagnosed? This condition is diagnosed based on: Your medical history. A physical exam. Lab tests, such as urine tests. Imaging tests. Your symptoms. You may also have other tests or procedures, such as: A cystoscopy. This involves putting a narrow tube into your urethra. The urethra is the organ that carries urine from your bladder to the outside of your body. This procedure is done to  view the lining of your bladder for tumors. A biopsy. This involves removing a tissue sample to look at under a microscope to check for cancer. Blood tests or imaging tests may be needed. These show how far into the bladder wall cancer has grown, and if cancer has spread to any other parts of your body. Tests may include: CT scan. MRI. Bone scan. X-ray. How is this treated? Your health care provider may recommend one or more types of treatment based on the stage of your cancer. The most common treatments are: Surgery to remove the cancer. Types of surgeries include: Removing a tumor on the inside wall of the bladder (transurethral resection). Removing the bladder (cystectomy). Radiation therapy. This is often combined with chemotherapy. Chemotherapy. Immunotherapy. This uses medicines to help your body's disease-fighting system (immune system) destroy cancer cells. Follow these instructions at home: Take over-the-counter and prescription medicines only as told by your health care provider. If you were prescribed an antibiotic medicine, take it as told by your health care provider. Do not stop using the antibiotic even if you start to feel better. Eat a healthy diet. Some treatments might affect your appetite. Do not use any products that contain nicotine or tobacco. These products include cigarettes, chewing tobacco, and vaping devices, such as e-cigarettes. If you need help quitting, ask your health care provider. Consider joining a support group. This may help you learn to deal with the stress of having bladder cancer. Tell your cancer care team if you develop side effects. Your team may be able to recommend ways to get relief. Keep all follow-up visits. This is important. Where to find more information American Cancer Society (ACS): cancer.org National  Cancer Institute (NCI): cancer.gov Contact a health care provider if: You have symptoms of a UTI. These  include: Fever. Chills. Weakness. Muscle aches. Pain in your abdomen. Urge to urinate that is stronger and happens more often than normal. Burning in the bladder or urethra when you urinate. Get help right away if: There is blood in your urine. You cannot urinate. You have severe pain or other symptoms that do not go away. Summary Bladder cancer is a condition where tumors grow in the bladder. Diagnosis is based on your medical history, a physical exam, lab tests, imaging tests, and your symptoms. Your health care provider may recommend one or more types of treatment based on the stage of your cancer. Consider joining a support group. This may help you learn to deal with the stress of having bladder cancer. This information is not intended to replace advice given to you by your health care provider. Make sure you discuss any questions you have with your health care provider. Document Revised: 02/10/2021 Document Reviewed: 02/10/2021 Elsevier Patient Education  2024 [employer].

## 2023-07-07 ENCOUNTER — Ambulatory Visit: Admitting: Urology

## 2023-07-08 ENCOUNTER — Other Ambulatory Visit: Payer: Self-pay | Admitting: Cardiology

## 2023-07-10 ENCOUNTER — Other Ambulatory Visit: Payer: Self-pay | Admitting: Cardiology

## 2023-07-12 ENCOUNTER — Telehealth: Payer: Self-pay | Admitting: Urology

## 2023-07-12 ENCOUNTER — Telehealth: Payer: Self-pay | Admitting: Physical Medicine & Rehabilitation

## 2023-07-12 DIAGNOSIS — G894 Chronic pain syndrome: Secondary | ICD-10-CM

## 2023-07-12 NOTE — Telephone Encounter (Signed)
 Patient calling the office for samples of medication:   1.  What medication and dosage are you requesting samples for? Gemtesa   2.  Are you currently out of this medication? yes

## 2023-07-12 NOTE — Telephone Encounter (Signed)
 Requesting medication refill for hydrocodone  please send to [facility]

## 2023-07-13 ENCOUNTER — Telehealth (HOSPITAL_COMMUNITY): Admitting: Psychiatry

## 2023-07-13 ENCOUNTER — Encounter (HOSPITAL_COMMUNITY): Payer: Self-pay

## 2023-07-13 MED ORDER — HYDROCODONE-ACETAMINOPHEN 10-325 MG PO TABS
1.0000 | ORAL_TABLET | Freq: Four times a day (QID) | ORAL | 0 refills | Status: DC | PRN
Start: 2023-07-13 — End: 2023-07-29

## 2023-07-20 ENCOUNTER — Encounter (HOSPITAL_COMMUNITY): Payer: Self-pay | Admitting: Psychiatry

## 2023-07-20 ENCOUNTER — Telehealth (INDEPENDENT_AMBULATORY_CARE_PROVIDER_SITE_OTHER): Admitting: Psychiatry

## 2023-07-20 DIAGNOSIS — F17210 Nicotine dependence, cigarettes, uncomplicated: Secondary | ICD-10-CM | POA: Diagnosis not present

## 2023-07-20 DIAGNOSIS — F410 Panic disorder [episodic paroxysmal anxiety] without agoraphobia: Secondary | ICD-10-CM

## 2023-07-20 DIAGNOSIS — F5104 Psychophysiologic insomnia: Secondary | ICD-10-CM

## 2023-07-20 DIAGNOSIS — F331 Major depressive disorder, recurrent, moderate: Secondary | ICD-10-CM | POA: Diagnosis not present

## 2023-07-20 DIAGNOSIS — F411 Generalized anxiety disorder: Secondary | ICD-10-CM | POA: Diagnosis not present

## 2023-07-20 DIAGNOSIS — F17200 Nicotine dependence, unspecified, uncomplicated: Secondary | ICD-10-CM

## 2023-07-20 MED ORDER — ESCITALOPRAM OXALATE 10 MG PO TABS: 10.0000 mg | ORAL_TABLET | Freq: Every day | ORAL | 5 refills | Status: DC
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
## 2023-07-20 NOTE — Patient Instructions (Addendum)
 We increased the lexapro  (escitalopram ) to 10mg  and switched it to daily instead of nightly. Remember to keep practicing the deep breathing and thankfulness technique as Mother's Day approaches.

## 2023-07-26 ENCOUNTER — Ambulatory Visit: Admitting: Physical Medicine & Rehabilitation

## 2023-07-29 ENCOUNTER — Encounter: Payer: Self-pay | Admitting: Physical Medicine & Rehabilitation

## 2023-07-29 ENCOUNTER — Encounter: Attending: Physical Medicine & Rehabilitation | Admitting: Physical Medicine & Rehabilitation

## 2023-07-29 VITALS — BP 136/81 | HR 78 | Ht 63.0 in | Wt 177.6 lb

## 2023-07-29 DIAGNOSIS — M2550 Pain in unspecified joint: Secondary | ICD-10-CM | POA: Diagnosis present

## 2023-07-29 DIAGNOSIS — Z79891 Long term (current) use of opiate analgesic: Secondary | ICD-10-CM | POA: Insufficient documentation

## 2023-07-29 DIAGNOSIS — M545 Low back pain, unspecified: Secondary | ICD-10-CM | POA: Insufficient documentation

## 2023-07-29 DIAGNOSIS — G894 Chronic pain syndrome: Secondary | ICD-10-CM | POA: Insufficient documentation

## 2023-07-29 DIAGNOSIS — F331 Major depressive disorder, recurrent, moderate: Secondary | ICD-10-CM | POA: Diagnosis not present

## 2023-07-29 DIAGNOSIS — G8929 Other chronic pain: Secondary | ICD-10-CM | POA: Diagnosis present

## 2023-07-29 MED ORDER — HYDROCODONE-ACETAMINOPHEN 10-325 MG PO TABS: 1.0000 | ORAL_TABLET | Freq: Four times a day (QID) | ORAL | 0 refills | Status: DC | PRN

## 2023-07-29 NOTE — Progress Notes (Signed)
 Subjective:    Patient ID: [name], female    DOB: [birth year], 73 y.o.   MRN: [id number]  HPI    HPI 01/01/23   [name] is a 73 y.o. year old female  who  has a past medical history of Anxiety, Arthritis, Bradycardia, Breast cancer, left breast (HCC) (1970s; 2015), CAD (coronary artery disease), Cancer of skin of leg, 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HCC), Chronic lower back pain, Chronic pain, COPD (chronic obstructive pulmonary disease) (HCC), Coronary artery disease, Depression, GERD (gastroesophageal reflux disease), Heart murmur, Hepatic cirrhosis (HCC) (12/20/2017), Hepatitis, History of blood transfusion, History of hiatal hernia, History of kidney stones, Hyperlipidemia, Hypertension, Hypothyroidism, Kidney stone, Melanoma of forearm, left (HCC), Migraine, Obesity, Stroke (HCC) (04/2003), Tobacco use disorder, and Wears glasses.   They are presenting to PM&R clinic as a new patient for pain management evaluation. They were referred by Dr. Kermit Ped for treatment of chronic pain.  Patient reports she has had pain in multiple areas for many years.  Reports she had pain in her neck due to arthritis for many years.  Reports she had previous injections in her neck ESI?  X 3 about 10 years ago that did not significantly reduce her overall pain but did help the burning pain she was having in the area.  She said she was seeing a "bone doctor" and Dr. Phyllis Breeze.  She reports she previously had cortisone injections in her shoulders more on the left side.  Shoulder pain is doing better recently.  She reports severe swelling and pain around her first [facility] joint.  She also has pain throughout other digits of her bilateral hands due to arthritis.  Additionally she has pain in her knees primarily on the left side.  This pain is worse with ambulation.  For several months she has been having pain around her left jaw.  Reports some shock type pain over her left face.  She has a lot of popping when she  opens and closes her jaw.  Recently had MRI of her face ordered by neurology, results pending.  She also had an MRI of her brain with results pending by neurology due to left-sided weakness rule out additional CVA.   He has a history of depression and anxiety.  Denies HI or SI.  Reports that she lost her son, dog and niece about 1 and half years ago.   Red flag symptoms: No red flags for back pain endorsed in Hx or ROS   Medications tried: Topical medications - denies  Nsaids - Excedrin  used in the past Tylenol   Helps for a short time  Opiates   Hydrocodone  10mg  for [employer], retired in July , now Dr. Kermit Ped Gabapentin - Allergy to this medication TCAs  nortriptyline  - helping jaw pain  SNRIs  Duloxetine?- not sure      Other treatments: PT-  PT for TMJ jaw, years ago it made the pain worse    TENs unit - denies  Injections- Shoulder injections Surgery - denies  Other  heating pad helps  Interval History 02/01/23  Patient is here for follow-up regarding her chronic neck pain, left-sided facial pain, polyarthralgia.  She had an MRI face completed in October which showed left TMJ degeneration.  Discontinued nortriptyline  because she did not feel like it was helping.  She continues to have left facial pain however it is doing better than it was during her past visit.  She continues to have a lot of pain in her neck and  base of her left thumb.  He also has pain in her left knee and left shoulder.  She continues to use hydrocodone  to manage her pain, she has used this for years.  She is not have any side effects with the medication.  She reports poor mood, did not tolerate sertraline  because she felt it made her mood worse.  She has discontinued this medication.  She reports that she has follow-up with psychiatry scheduled.  Interval history 04/01/2023 Patient is here for follow-up regarding her chronic neck, lower back, left-sided facial pain and polyarthralgia. Worst pain is in her  lower back and neck.   Since her last visit she has had treatment for bladder cancer. She was ordered oxycodone  after the procedure, however only used half a pill.  She has continued using her hydrocodone  10 mg which has been helping to manage her pain.  She does feel that the hydrocodone  she recently picked up from the pharmacy has a different pill shape than previously.  She feels like this version does not work as well as the prior version.  She denies any side effects to the medication.  She reports she is following up with psychiatry regarding her mood.  Denies SI or HI.  Interval history 05/31/2023 [name] is here regarding her chronic pain.  Chronic neck, lower back, facial pain and pain in multiple joints is largely unchanged from prior visit. She is also having some suprapubic pain related to bladder cancer.  She is currently being treated for her bladder cancer.  She had TURBT by Dr. 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  She is having treatments with BCG bladder instillation. Reports Norco 10 is helping keep her pain under control.  She had been ordered oxycodone  after procedure says she did not use this.  Interval history 07/29/23 Patient reports continued low back pain.  She is generally taking this medication about 3 times a day.  Pain is primarily axial.  She also has pain in her shoulders.  She has pain in her knees left greater than right.  Pain is controlled overall with current dose of Norco.  Denies any side effects with the medication.  She is not taking benzos, was weaned off this by PCP. She reports she completed treatment with chemo for her bladder cancer.  A1c has been up recently. Patient reports her depression/mood is doing better.   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    Pain Inventory Average Pain 10 Pain Right Now 7 My pain is constant and aching  In the last 24 hours, has pain interfered with  the following? General activity 7 Relation with others 0 Enjoyment of life 2 What TIME of day is your pain at its worst? morning , daytime, evening, and night Sleep (in general) Poor  Pain is worse with: bending and standing Pain improves with: rest, medication, and injections Relief from Meds: 10  Family History  Problem Relation Age of Onset   Heart failure Mother    Cancer Father    Heart failure Father    Cancer Sister    Dementia Sister    Heart disease Other    Arthritis Other    Cancer Other    Diabetes Other    Kidney disease Other    Cancer Sister    Heart failure Brother    Social History   Socioeconomic History   Marital status: Divorced    Spouse name: Not on file  Number of children: 2   Years of education: Not on file   Highest education level: Not on file  Occupational History   Occupation: Disabled   Occupation: retired  Tobacco Use   Smoking status: Some Days    Current packs/day: 0.50    Average packs/day: 0.5 packs/day for 40.0 years (20.0 ttl pk-yrs)    Types: Cigarettes    Passive exposure: Current   Smokeless tobacco: Never   Tobacco comments:    Trying to quit with use of Chantix and calm as of 04/13/2023 but restarted in February with 2 cigarettes per week.   Vaping Use   Vaping status: Never Used  Substance and Sexual Activity   Alcohol use: Not Currently   Drug use: Never   Sexual activity: Not Currently  Other Topics Concern   Not on file  Social History Narrative   ** Merged History Encounter **       Right handed   Wears glasses    Drinks 1 cup of coffee daily   Drinks sweet tea twice a week.   Social Drivers of Corporate investment banker Strain: Low Risk  (12/15/2022)   Overall Financial Resource Strain (CARDIA)    Difficulty of Paying Living Expenses: Not hard at all  Food Insecurity: No Food Insecurity (12/15/2022)   Hunger Vital Sign    Worried About Running Out of Food in the Last Year: Never true    Ran Out of  Food in the Last Year: Never true  Transportation Needs: No Transportation Needs (12/15/2022)   PRAPARE - Administrator, Civil Service (Medical): No    Lack of Transportation (Non-Medical): No  Physical Activity: Sufficiently Active (12/15/2022)   Exercise Vital Sign    Days of Exercise per Week: 7 days    Minutes of Exercise per Session: 30 min  Stress: Stress Concern Present (12/15/2022)   [employer] of Occupational Health - Occupational Stress Questionnaire    Feeling of Stress : To some extent  Social Connections: Moderately Isolated (12/15/2022)   Social Connection and Isolation Panel [NHANES]    Frequency of Communication with Friends and Family: More than three times a week    Frequency of Social Gatherings with Friends and Family: More than three times a week    Attends Religious Services: More than 4 times per year    Active Member of Clubs or Organizations: No    Attends Banker Meetings: Never    Marital Status: Divorced   Past Surgical History:  Procedure Laterality Date   ABDOMINAL HYSTERECTOMY  1978   APPENDECTOMY     BIOPSY  04/28/2022   Procedure: BIOPSY;  Surgeon: Urban Garden, MD;  [facility];  Service: Gastroenterology;;   BLADDER INSTILLATION N/A 04/29/2023   Procedure: BLADDER INSTILLATION- gemcitabine ;  Surgeon: Marco Severs, MD;  [facility]: [facility];  Service: Urology;  Laterality: N/A;   BREAST BIOPSY Left 1970s; 2015   BREAST LUMPECTOMY Left 1970s   BREAST LUMPECTOMY WITH NEEDLE LOCALIZATION Left 07/07/2013   Procedure: BREAST LUMPECTOMY WITH NEEDLE LOCALIZATION;  Surgeon: Mayme Spearman, MD;  Location: [facility];  Service: General;  Laterality: Left;   CARDIAC CATHETERIZATION N/A 01/22/2016   Procedure: Left Heart Cath and Coronary Angiography;  Surgeon: Peter M Swaziland, MD;  [facility]: [facility] INVASIVE CV LAB;  Service: Cardiovascular;  Laterality: N/A;   CARDIAC CATHETERIZATION N/A  01/22/2016   Procedure: Coronary Stent Intervention;  Surgeon: Peter M Swaziland,  MD;  Location: [facility] INVASIVE CV LAB;  Service: Cardiovascular;  Laterality: N/A;   CAROTID STENT Left 04/2003   small left parietal and left hemispheric CVA/notes 07/29/2010   CHOLECYSTECTOMY OPEN     COLONOSCOPY  2011   COLONOSCOPY N/A 05/10/2019   Procedure: COLONOSCOPY;  Surgeon: Ruby Corporal, MD;  [facility];  Service: Endoscopy;  Laterality: N/A;  1:45   COLONOSCOPY WITH PROPOFOL  N/A 09/01/2022   Procedure: COLONOSCOPY WITH PROPOFOL ;  Surgeon: Urban Garden, MD;  [facility];  Service: Gastroenterology;  Laterality: N/A;  1:45PM;ASA 3   CORONARY ATHERECTOMY N/A 12/29/2019   Procedure: CORONARY ATHERECTOMY;  Surgeon: Lucendia Rusk, MD;  Location: [facility] INVASIVE CV LAB;  Service: Cardiovascular;  Laterality: N/A;  Prox RCA   CORONARY STENT INTERVENTION N/A 12/29/2019   Procedure: CORONARY STENT INTERVENTION;  Surgeon: Lucendia Rusk, MD;  Location: [facility] INVASIVE CV LAB;  Service: Cardiovascular;  Laterality: N/A;  Prox RCA   CORONARY ULTRASOUND/IVUS N/A 12/29/2019   Procedure: Intravascular Ultrasound/IVUS;  Surgeon: Lucendia Rusk, MD;  Location: [facility] INVASIVE CV LAB;  Service: Cardiovascular;  Laterality: N/A;   CYSTOSCOPY WITH BIOPSY N/A 04/29/2023   Procedure: CYSTOSCOPY WITH BIOPSY;  Surgeon: Marco Severs, MD;  [facility];  Service: Urology;  Laterality: N/A;   ESOPHAGEAL DILATION N/A 04/28/2022   Procedure: ESOPHAGEAL DILATION;  Surgeon: Urban Garden, MD;  [facility];  Service: Gastroenterology;  Laterality: N/A;   ESOPHAGOGASTRODUODENOSCOPY  08/06/2011   Procedure: ESOPHAGOGASTRODUODENOSCOPY (EGD);  Surgeon: Ruby Corporal, MD;  [facility];  Service: Endoscopy;  Laterality: N/A;   ESOPHAGOGASTRODUODENOSCOPY N/A 05/10/2019   Procedure: ESOPHAGOGASTRODUODENOSCOPY (EGD);  Surgeon: Ruby Corporal, MD;   [facility];  Service: Endoscopy;  Laterality: N/A;   ESOPHAGOGASTRODUODENOSCOPY (EGD) WITH PROPOFOL  N/A 04/28/2022   Procedure: ESOPHAGOGASTRODUODENOSCOPY (EGD) WITH PROPOFOL ;  Surgeon: Urban Garden, MD;  [facility];  Service: Gastroenterology;  Laterality: N/A;  10:30 am, pt can't move up due to transportation   FLEXIBLE SIGMOIDOSCOPY  08/06/2011   Procedure: FLEXIBLE SIGMOIDOSCOPY;  Surgeon: Ruby Corporal, MD;  [facility];  Service: Endoscopy;  Laterality: N/A;   HEMOSTASIS CLIP PLACEMENT  09/01/2022   Procedure: HEMOSTASIS CLIP PLACEMENT;  Surgeon: Urban Garden, MD;  [facility];  Service: Gastroenterology;;   INGUINAL HERNIA REPAIR Left 1970s   IR ANGIO INTRA EXTRACRAN SEL COM CAROTID INNOMINATE BILAT MOD SED  11/01/2020   IR ANGIO VERTEBRAL SEL VERTEBRAL BILAT MOD SED  11/01/2020   IR RADIOLOGIST EVAL & MGMT  09/23/2020   IR US  GUIDE VASC ACCESS RIGHT  11/01/2020   LARYNX SURGERY  1970s   Polyps excised   LEFT HEART CATH AND CORONARY ANGIOGRAPHY N/A 12/29/2019   Procedure: LEFT HEART CATH AND CORONARY ANGIOGRAPHY;  Surgeon: Lucendia Rusk, MD;  [facility];  Service: Cardiovascular;  Laterality: N/A;   LEFT HEART CATH AND CORONARY ANGIOGRAPHY N/A 10/12/2022   Procedure: LEFT HEART CATH AND CORONARY ANGIOGRAPHY;  Surgeon: Lucendia Rusk, MD;  Location: [facility] INVASIVE CV LAB;  Service: Cardiovascular;  Laterality: N/A;   MALONEY DILATION  05/10/2019   Procedure: Londa Rival DILATION;  Surgeon: Ruby Corporal, MD;  [facility];  Service: Endoscopy;;   MELANOMA EXCISION Left ~ 2016   forearm   POLYPECTOMY  05/10/2019   Procedure: POLYPECTOMY;  Surgeon: Ruby Corporal, MD;  [facility];  Service: Endoscopy;;  colon   POLYPECTOMY  09/01/2022   Procedure: POLYPECTOMY INTESTINAL;  Surgeon: Umberto Ganong, Bearl Limes, MD;  [facility];  Service:  Gastroenterology;;   SPLENECTOMY, 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   spontaneous rupture   SPLENECTOMY, TOTAL     SUBMUCOSAL LIFTING INJECTION  09/01/2022   Procedure: SUBMUCOSAL LIFTING INJECTION;  Surgeon: Urban Garden, MD;  [facility];  Service: Gastroenterology;;   SUBMUCOSAL TATTOO INJECTION  09/01/2022   Procedure: SUBMUCOSAL TATTOO INJECTION;  Surgeon: Urban Garden, MD;  [facility];  Service: Gastroenterology;;   TEMPORARY PACEMAKER N/A 12/29/2019   Procedure: TEMPORARY PACEMAKER;  Surgeon: Lucendia Rusk, MD;  Location: [facility] INVASIVE CV LAB;  Service: Cardiovascular;  Laterality: N/A;   TRANSURETHRAL RESECTION OF BLADDER TUMOR N/A 03/15/2023   Procedure: TRANSURETHRAL RESECTION OF BLADDER TUMOR (TURBT)with Gemcitibane;  Surgeon: Marco Severs, MD;  [facility];  Service: Urology;  Laterality: N/A;   TRANSURETHRAL RESECTION OF BLADDER TUMOR N/A 04/29/2023   Procedure: TRANSURETHRAL RESECTION OF BLADDER TUMOR (TURBT);  Surgeon: Marco Severs, MD;  [facility];  Service: Urology;  Laterality: N/A;   Past Surgical History:  Procedure Laterality Date   ABDOMINAL HYSTERECTOMY  1978   APPENDECTOMY     BIOPSY  04/28/2022   Procedure: BIOPSY;  Surgeon: Urban Garden, MD;  [facility];  Service: Gastroenterology;;   BLADDER INSTILLATION N/A 04/29/2023   Procedure: BLADDER INSTILLATION- gemcitabine ;  Surgeon: Marco Severs, MD;  [facility];  Service: Urology;  Laterality: N/A;   BREAST BIOPSY Left 1970s; 2015   BREAST LUMPECTOMY Left 1970s   BREAST LUMPECTOMY WITH NEEDLE LOCALIZATION Left 07/07/2013   Procedure: BREAST LUMPECTOMY WITH NEEDLE LOCALIZATION;  Surgeon: Mayme Spearman, MD;  Location: [facility];  Service: General;  Laterality: Left;   CARDIAC CATHETERIZATION N/A 01/22/2016   Procedure: Left Heart Cath and Coronary Angiography;  Surgeon: Peter M Swaziland, MD;  Location: [facility]  INVASIVE CV LAB;  Service: Cardiovascular;  Laterality: N/A;   CARDIAC CATHETERIZATION N/A 01/22/2016   Procedure: Coronary Stent Intervention;  Surgeon: Peter M Swaziland, MD;  Location: [facility] INVASIVE CV LAB;  Service: Cardiovascular;  Laterality: N/A;   CAROTID STENT Left 04/2003   small left parietal and left hemispheric CVA/notes 07/29/2010   CHOLECYSTECTOMY OPEN     COLONOSCOPY  2011   COLONOSCOPY N/A 05/10/2019   Procedure: COLONOSCOPY;  Surgeon: Ruby Corporal, MD;  [facility];  Service: Endoscopy;  Laterality: N/A;  1:45   COLONOSCOPY WITH PROPOFOL  N/A 09/01/2022   Procedure: COLONOSCOPY WITH PROPOFOL ;  Surgeon: Urban Garden, MD;  [facility];  Service: Gastroenterology;  Laterality: N/A;  1:45PM;ASA 3   CORONARY ATHERECTOMY N/A 12/29/2019   Procedure: CORONARY ATHERECTOMY;  Surgeon: Lucendia Rusk, MD;  Location: [facility] INVASIVE CV LAB;  Service: Cardiovascular;  Laterality: N/A;  Prox RCA   CORONARY STENT INTERVENTION N/A 12/29/2019   Procedure: CORONARY STENT INTERVENTION;  Surgeon: Lucendia Rusk, MD;  Location: [facility] INVASIVE CV LAB;  Service: Cardiovascular;  Laterality: N/A;  Prox RCA   CORONARY ULTRASOUND/IVUS N/A 12/29/2019   Procedure: Intravascular Ultrasound/IVUS;  Surgeon: Lucendia Rusk, MD;  Location: [facility] INVASIVE CV LAB;  Service: Cardiovascular;  Laterality: N/A;   CYSTOSCOPY WITH BIOPSY N/A 04/29/2023   Procedure: CYSTOSCOPY WITH BIOPSY;  Surgeon: Marco Severs, MD;  [facility];  Service: Urology;  Laterality: N/A;   ESOPHAGEAL DILATION N/A 04/28/2022   Procedure: ESOPHAGEAL  DILATION;  Surgeon: Umberto Ganong, Bearl Limes, MD;  [facility];  Service: Gastroenterology;  Laterality: N/A;   ESOPHAGOGASTRODUODENOSCOPY  08/06/2011   Procedure: ESOPHAGOGASTRODUODENOSCOPY (EGD);  Surgeon: Ruby Corporal, MD;  [facility];  Service: Endoscopy;  Laterality: N/A;   ESOPHAGOGASTRODUODENOSCOPY N/A  05/10/2019   Procedure: ESOPHAGOGASTRODUODENOSCOPY (EGD);  Surgeon: Ruby Corporal, MD;  [facility];  Service: Endoscopy;  Laterality: N/A;   ESOPHAGOGASTRODUODENOSCOPY (EGD) WITH PROPOFOL  N/A 04/28/2022   Procedure: ESOPHAGOGASTRODUODENOSCOPY (EGD) WITH PROPOFOL ;  Surgeon: Urban Garden, MD;  [facility];  Service: Gastroenterology;  Laterality: N/A;  10:30 am, pt can't move up due to transportation   FLEXIBLE SIGMOIDOSCOPY  08/06/2011   Procedure: FLEXIBLE SIGMOIDOSCOPY;  Surgeon: Ruby Corporal, MD;  [facility];  Service: Endoscopy;  Laterality: N/A;   HEMOSTASIS CLIP PLACEMENT  09/01/2022   Procedure: HEMOSTASIS CLIP PLACEMENT;  Surgeon: Urban Garden, MD;  [facility];  Service: Gastroenterology;;   INGUINAL HERNIA REPAIR Left 1970s   IR ANGIO INTRA EXTRACRAN SEL COM CAROTID INNOMINATE BILAT MOD SED  11/01/2020   IR ANGIO VERTEBRAL SEL VERTEBRAL BILAT MOD SED  11/01/2020   IR RADIOLOGIST EVAL & MGMT  09/23/2020   IR US  GUIDE VASC ACCESS RIGHT  11/01/2020   LARYNX SURGERY  1970s   Polyps excised   LEFT HEART CATH AND CORONARY ANGIOGRAPHY N/A 12/29/2019   Procedure: LEFT HEART CATH AND CORONARY ANGIOGRAPHY;  Surgeon: Lucendia Rusk, MD;  Location: [facility];  Service: Cardiovascular;  Laterality: N/A;   LEFT HEART CATH AND CORONARY ANGIOGRAPHY N/A 10/12/2022   Procedure: LEFT HEART CATH AND CORONARY ANGIOGRAPHY;  Surgeon: Lucendia Rusk, MD;  Location: [facility] INVASIVE CV LAB;  Service: Cardiovascular;  Laterality: N/A;   MALONEY DILATION  05/10/2019   Procedure: Londa Rival DILATION;  Surgeon: Ruby Corporal, MD;  [facility];  Service: Endoscopy;;   MELANOMA EXCISION Left ~ 2016   forearm   POLYPECTOMY  05/10/2019   Procedure: POLYPECTOMY;  Surgeon: Ruby Corporal, MD;  [facility];  Service: Endoscopy;;  colon   POLYPECTOMY  09/01/2022   Procedure: POLYPECTOMY  INTESTINAL;  Surgeon: Urban Garden, MD;  [facility];  Service: Gastroenterology;;   SPLENECTOMY, 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   spontaneous rupture   SPLENECTOMY, TOTAL     SUBMUCOSAL LIFTING INJECTION  09/01/2022   Procedure: SUBMUCOSAL LIFTING INJECTION;  Surgeon: Urban Garden, MD;  [facility];  Service: Gastroenterology;;   SUBMUCOSAL TATTOO INJECTION  09/01/2022   Procedure: SUBMUCOSAL TATTOO INJECTION;  Surgeon: Urban Garden, MD;  [facility];  Service: Gastroenterology;;   TEMPORARY PACEMAKER N/A 12/29/2019   Procedure: TEMPORARY PACEMAKER;  Surgeon: Lucendia Rusk, MD;  Location: [facility] INVASIVE CV LAB;  Service: Cardiovascular;  Laterality: N/A;   TRANSURETHRAL RESECTION OF BLADDER TUMOR N/A 03/15/2023   Procedure: TRANSURETHRAL RESECTION OF BLADDER TUMOR (TURBT)with Gemcitibane;  Surgeon: Marco Severs, MD;  [facility];  Service: Urology;  Laterality: N/A;   TRANSURETHRAL RESECTION OF BLADDER TUMOR N/A 04/29/2023   Procedure: TRANSURETHRAL RESECTION OF BLADDER TUMOR (TURBT);  Surgeon: Marco Severs, MD;  Location[facility];  Service: Urology;  Laterality: N/A;   Past Medical History:  Diagnosis Date   Anxiety    Arthritis    "hands, arms, back, neck, knees, fingers" (01/21/2016)   Bradycardia    Breast cancer, left breast (HCC) 1970s; 2015  CAD (coronary artery disease)    a. s/p NSTEMI in 2017 with DES to OM and unsuccessful PCI to RCA with wire dissection b. DES to RCA in 2021   Cancer of skin of leg    BLE   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HCC)    left   Chronic lower back pain    Chronic pain    COPD (chronic obstructive pulmonary disease) (HCC)    Coronary artery disease    Depression    GERD (gastroesophageal reflux disease)    Heart murmur    Hepatic cirrhosis (HCC) 12/20/2017   Hepatitis    History of blood transfusion    "related to spleen"   History of hiatal hernia    History of  kidney stones    Hyperlipidemia    Hypertension    Hypothyroidism    Long term prescription benzodiazepine use 12/10/2022   Melanoma of forearm, left (HCC)    Migraine    "a few migraines/year" (01/21/2016)   Obesity    Polypharmacy 12/10/2022   Stroke (HCC) 04/2003   2 carotid artery stents in place; small left parietal and left hemispheric CVA.Sam Creighton 01/21/2016   Tobacco use disorder    50 pack years; questionably discontinued in 2010   Wears glasses    BP 136/81 (BP Location: Left Arm, Patient Position: Sitting)   Pulse 78   Ht 5\' 3"  (1.6 m)   Wt 177 lb 9.6 oz (80.6 kg)   SpO2 93%   BMI 31.46 kg/m   Opioid Risk Score:   Fall Risk Score:  `1  Depression screen [facility] 2/9     07/29/2023   10:56 AM 05/31/2023   10:17 AM 04/01/2023   12:48 PM 02/01/2023    9:35 AM 01/21/2023    9:33 AM 01/01/2023   11:22 AM 12/15/2022    2:52 PM  Depression screen PHQ 2/9  Decreased Interest 0 0 0 3 1 0 1  Down, Depressed, Hopeless 0 0 0 3 3 2 1   PHQ - 2 Score 0 0 0 6 4 2 2   Altered sleeping     3 2 0  Tired, decreased energy     3 3 2   Change in appetite     1 0 2  Feeling bad or failure about yourself      0 1 0  Trouble concentrating     0 0 0  Moving slowly or fidgety/restless     0 0 0  Suicidal thoughts      0 0  PHQ-9 Score     11 8 6   Difficult doing work/chores     Somewhat difficult  Somewhat difficult     Review of Systems  Constitutional: Negative.   HENT: Negative.    Eyes: Negative.   Respiratory: Negative.    Cardiovascular: Negative.   Gastrointestinal: Negative.   Endocrine: Negative.   Genitourinary: Negative.   Musculoskeletal:  Positive for arthralgias and back pain.  Skin: Negative.   Allergic/Immunologic: Negative.   Neurological: Negative.   Hematological: Negative.   Psychiatric/Behavioral:  Positive for dysphoric mood.   All other systems reviewed and are negative.      Objective:   Physical Exam   Gen: no distress, normal appearing HEENT: oral  mucosa pink and moist, NCAT Chest: normal effort, normal rate of breathing Abd: soft, non-distended Ext: no edema Psych: pleasant, normal affect Skin: intact Neuro: Alert and oriented x3, follows simple commands, fluent speech, comprehension intact Strength 5 out of 5  in right upper and right lower extremity Strength 4 out of 5 in left upper extremity and left lower extremity TTP L-spine and C-spine Left first CMC TTP Slump test negative bilaterally Bilateral knee with mild TTP left greater than right  Prior exam Decreased/alerted sensation L face Facial movements intact b/l Strength 5/5 RUE and RLE Strength 4/5 LUE and LLE Musculoskeletal:  Arthritic changes in b/l hands noted L knee tenderness to palpation Mild B/L hand tenderness  L 1st CMC pain with palpation and PROM, mild swelling around this joint + L TMJ tenderness and clicking  + TTP L spine and C spine paraspinal muscles + trapezius tenderness to palpation, Mild L shoulder tenderness Spurling's- neck pain resulted  Slump test negative    Xray R hand  2020 Right hand x-rays   Right wrist pain   No fracture dislocation or malalignment joint spaces seem preserved   Impression normal x-ray of the right hand   Xray L hand  2020  Left hand pain swelling numbness tingling   AP lateral oblique of the left hand no fracture dislocation or bony arthritis is seen.  Alignment looks normal.   Impression normal left hand x-ray   L knee Xray  12/25/21 On my read appears to show evidence  mild OA     C spine xary 2017 FINDINGS: Bones are osteopenic. Advanced spondylosis and degenerative disc disease at C5-6 with disc space loss, sclerosis and large osteophytes mild degenerative changes at other levels. Mild diffuse facet arthropathy. Facets appear aligned. Foraminal encroachment suspected of the left C5-6 foramen related to bony spurring. Trachea is midline. Odontoid is intact.   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   No acute osseous finding by plain radiography.       CT C spine 2016 IMPRESSION: 1. Cervical spine degenerative changes. 2. Straightening of the normal cervical lordosis. 3. 9 mm right lobe thyroid  nodule, too small to characterize, but most likely benign in the absence of known clinical risk factors for thyroid  carcinoma.   MRI Face and brain 12/23/22 Brain: No evidence of acute infarct, intracranial hemorrhage, mass, midline shift or extra-axial fluid collection. No pathologic enhancement. Mild for age chronic microvascular ischemic change.   Trigeminal nerves: No mass is identified along the course of the trigeminal nerves on thin-slice imaging. No evidence of neurovascular compression.   Vascular: Major arterial flow voids are maintained at the skull base.   Sinuses/Orbits: Clear sinuses. No acute or significant orbital findings.   Soft tissues: Unremarkable.  No visible edema or mass lesion.   Osseous: Normal marrow signal without focal lesion. TMJs are located. Left TMJ degenerative change.   Other: None.   IMPRESSION: 1. No evidence of acute abnormality intracranially or in the face. 2. Left TMJ degenerative change.   '  02/01/23 Xray thumb left FINDINGS: Bones are osteopenic. Normal alignment without acute osseous finding or fracture. Moderate degenerative arthropathy of the left first CMC joint, first MCP joint, and the thumb interphalangeal joint. Soft tissues unremarkable.   IMPRESSION: Osteopenia and degenerative changes as above. No acute finding by plain radiography.   Assessment & Plan:   1) Chronic neck pain with prior imaging L spine spondylosis 2) Polyarthralgia with L knee pain, L shoulder pain, b/l hand pain.   -She is also having pain in her right knee more recently. 3) L sided facial pain likely due to TMJ pain.             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  Next refill ordered -Patient discontinued nortriptyline  since her last visit she did not feel like this was helping -Continue UDS and pill counts.  Continue PDMP monitoring.  Pain contract completed prior visit. -Pill counts consistent  -Patient reports she is seeing psychiatry-has been weaning clorazepate , last prescription was 04/14/2023 by PDMP.  She reports nonbenzodiazepine medications are helping. -Consider Cymbalta -Discussed calling our clinic before any surgeries if any different pain medications might be needed -Consider imaging if LBP worsens-will hold off for now as pain has been overall stable/controlled.  Has had a lot going on with recent bladder cancer treatment. -Pill count consistent overall but she reports she also has some pills in her container at home.  Advised bringing all her pills to next visit.

## 2023-08-23 ENCOUNTER — Ambulatory Visit (INDEPENDENT_AMBULATORY_CARE_PROVIDER_SITE_OTHER): Admitting: Urology

## 2023-08-23 VITALS — BP 145/68 | HR 78

## 2023-08-23 DIAGNOSIS — Z8551 Personal history of malignant neoplasm of bladder: Secondary | ICD-10-CM

## 2023-08-23 DIAGNOSIS — N3289 Other specified disorders of bladder: Secondary | ICD-10-CM | POA: Diagnosis not present

## 2023-08-23 DIAGNOSIS — C672 Malignant neoplasm of lateral wall of bladder: Secondary | ICD-10-CM

## 2023-08-23 LAB — URINALYSIS, ROUTINE W REFLEX MICROSCOPIC
Bilirubin, UA: NEGATIVE
Leukocytes,UA: NEGATIVE
Nitrite, UA: NEGATIVE
Protein,UA: NEGATIVE
RBC, UA: NEGATIVE
Specific Gravity, UA: 1.02 (ref 1.005–1.030)
Urobilinogen, Ur: 0.2 mg/dL (ref 0.2–1.0)
pH, UA: 6 (ref 5.0–7.5)

## 2023-08-23 MED ORDER — CEPHALEXIN 500 MG PO CAPS
500.0000 mg | ORAL_CAPSULE | Freq: Once | ORAL | Status: AC
  Administered 2023-08-23: 500 mg via ORAL

## 2023-08-23 MED ORDER — FLUCONAZOLE 150 MG PO TABS: 150.0000 mg | ORAL_TABLET | Freq: Every day | ORAL | 0 refills | Status: DC

## 2023-08-23 NOTE — Progress Notes (Unsigned)
   08/23/23  CC: followup bladder cancer   HPI: [name] is a 72yo here for followup for high grade bladder cancer. She finished 6 weeks of BCG Blood pressure (!) 145/68, pulse 78. NED. A&Ox3.   No respiratory distress   Abd soft, NT, ND Normal external genitalia with patent urethral meatus  Cystoscopy Procedure Note  Patient identification was confirmed, informed consent was obtained, and patient was prepped using Betadine solution.  Lidocaine  jelly was administered per urethral meatus.    Procedure: - Flexible cystoscope introduced, without any difficulty.   - Thorough search of the bladder revealed:    normal urethral meatus    normal urothelium    no stones    no ulcers     3-4xm sessile erythematous lesion extending from previous right dome resection site    no urethral polyps    no trabeculation  - Ureteral orifices were normal in position and appearance.  Post-Procedure: - Patient tolerated the procedure well  Assessment/ Plan: The risks/benefits/alternatives to transurethral resection of a bladder tumor was explained to the patient and she understands and wishes to proceed with surgery   No follow-ups on file.  Johnie Nailer, MD

## 2023-08-23 NOTE — H&P (View-Only) (Signed)
   08/23/23  CC: followup bladder cancer   HPI: Ms Saine is a 72yo here for followup for high grade bladder cancer. She finished 6 weeks of BCG Blood pressure (!) 145/68, pulse 78. NED. A&Ox3.   No respiratory distress   Abd soft, NT, ND Normal external genitalia with patent urethral meatus  Cystoscopy Procedure Note  Patient identification was confirmed, informed consent was obtained, and patient was prepped using Betadine solution.  Lidocaine  jelly was administered per urethral meatus.    Procedure: - Flexible cystoscope introduced, without any difficulty.   - Thorough search of the bladder revealed:    normal urethral meatus    normal urothelium    no stones    no ulcers     3-4xm sessile erythematous lesion extending from previous right dome resection site    no urethral polyps    no trabeculation  - Ureteral orifices were normal in position and appearance.  Post-Procedure: - Patient tolerated the procedure well  Assessment/ Plan: The risks/benefits/alternatives to transurethral resection of a bladder tumor was explained to the patient and she understands and wishes to proceed with surgery   No follow-ups on file.  Johnie Nailer, MD

## 2023-08-24 ENCOUNTER — Encounter: Payer: Self-pay | Admitting: Urology

## 2023-08-24 NOTE — Patient Instructions (Signed)
 Transurethral Resection of Bladder Tumor  Transurethral resection of a bladder tumor is the removal (resection) of cancerous tissue (tumor) from the inside wall of the bladder. The bladder is the organ that holds urine. The tumor is removed through the tube that carries urine out of the body (urethra). In a transurethral resection, a thin telescope with a light, a tiny camera, and an electric cutting edge (resectoscope) is passed through the urethra. In men, the opening of the urethra is at the end of the penis. In women, it is just above the opening of the vagina. Tell a health care provider about: Any allergies you have. All medicines you are taking, including vitamins, herbs, eye drops, creams, and over-the-counter medicines. Any problems you or family members have had with anesthetic medicines. Any bleeding problems you have. Any surgeries you have had. Any medical conditions you have, including recent urinary tract infections. Whether you are pregnant or may be pregnant. What are the risks? Generally, this is a safe procedure. However, problems may occur, including: Infection. Bleeding. Allergic reactions to medicines. Damage to nearby structures or organs. Difficulty urinating from blockage of the urethra or not being able to urinate (urinary retention). Deep vein thrombosis. This is a blood clot that can develop in your leg. Recurring cancer. What happens before the procedure? When to stop eating and drinking Follow instructions from your health care provider about what you may eat and drink before your procedure. These may include: 8 hours before your procedure Stop eating most foods. Do not eat meat, fried foods, or fatty foods. Eat only light foods, such as toast or crackers. All liquids are okay except energy drinks and alcohol. 6 hours before your procedure Stop eating. Drink only clear liquids, such as water, clear fruit juice, black coffee, plain tea, and sports  drinks. Do not drink energy drinks or alcohol. 2 hours before your procedure Stop drinking all liquids. You may be allowed to take medicines with small sips of water. Medicines Ask your health care provider about: Changing or stopping your regular medicines. This is especially important if you are taking diabetes medicines or blood thinners. Taking medicines such as aspirin and ibuprofen. These medicines can thin your blood. Do not take these medicines unless your health care provider tells you to take them. Taking over-the-counter medicines, vitamins, herbs, and supplements. General instructions If you will be going home right after the procedure, plan to have a responsible adult: Take you home from the hospital or clinic. You will not be allowed to drive. Care for you for the time you are told. Ask your health care provider what steps will be taken to help prevent infection. These steps may include: Washing skin with a germ-killing soap. Taking antibiotic medicine. Do not use any products that contain nicotine or tobacco for at least 4 weeks before the procedure. These products include cigarettes, chewing tobacco, and vaping devices, such as e-cigarettes. If you need help quitting, ask your health care provider. What happens during the procedure? An IV will be inserted into one of your veins. You will be given one or more of the following: A medicine to help you relax (sedative). A medicine that is injected into your spine to numb the area below and slightly above the injection site (spinal anesthetic). A medicine that is injected into an area of your body to numb everything below the injection site (regional anesthetic). A medicine to make you fall asleep (general anesthetic). Your legs will be placed in foot rests (  stirrups) to open your legs and bend your knees. The resectoscope will be passed through your urethra and into your bladder. The part of your bladder with the tumor will be  resected by the cutting edge of the resectoscope. Fluid will be passed to rinse out the cut tissues (irrigation). The resectoscope will then be taken out. A small, thin tube (catheter) will be passed through your urethra and into your bladder. The catheter will drain urine into a bag outside of your body. The procedure may vary among health care providers and hospitals. What happens after the procedure? Your blood pressure, heart rate, breathing rate, and blood oxygen level will be monitored until you leave the hospital or clinic. You may continue to receive fluids and medicines through an IV. You will be given pain medicine to relieve pain. You will have a catheter to drain your urine. The amount of urine will be measured. If you have blood in your urine, your bladder may be rinsed out by passing fluid through your catheter. You will be encouraged to walk as soon as you can. You may have to wear compression stockings. These stockings help to prevent blood clots and reduce swelling in your legs. If you were given a sedative during the procedure, it can affect you for several hours. Do not drive or operate machinery until your health care provider says that it is safe. Summary Transurethral resection of a bladder tumor is the removal (resection) of a cancerous growth (tumor) on the inside wall of the bladder. To do this procedure, your health care provider uses a thin telescope with a light, a tiny camera, and an electric cutting edge (resectoscope) that is guided to your bladder through your urethra. The part of your bladder that is affected by the tumor will be resected by the cutting edge of the resectoscope. A catheter will be passed through your urethra and into your bladder. The catheter will drain urine into a bag outside of your body. If you will be going home right after the procedure, plan to have a responsible adult take you home from the hospital or clinic. You will not be allowed to  drive. This information is not intended to replace advice given to you by your health care provider. Make sure you discuss any questions you have with your health care provider. Document Revised: 03/07/2021 Document Reviewed: 03/07/2021 Elsevier Patient Education  2024 [employer].

## 2023-08-30 ENCOUNTER — Other Ambulatory Visit: Payer: Self-pay | Admitting: *Deleted

## 2023-08-30 ENCOUNTER — Telehealth: Payer: Self-pay | Admitting: Urology

## 2023-08-30 NOTE — Telephone Encounter (Signed)
 Patient is aware that we are not able to send in any medication for her anxiety.  I informed her that she will need to call her PCP to be evaluated.  Patient voiced understanding and will try to get a sooner appt.

## 2023-08-30 NOTE — Telephone Encounter (Signed)
 Patient called again she needs her anxiety medication called in asap, all she is doing is crying . She is upset because the cancer came back, her daughter is in the hospital in [place] and she is overwhelmed.  She also wants to know when she is going to be scheduled for the biopsy?

## 2023-09-08 ENCOUNTER — Other Ambulatory Visit: Payer: Self-pay

## 2023-09-08 ENCOUNTER — Encounter (HOSPITAL_COMMUNITY)
Admission: RE | Admit: 2023-09-08 | Discharge: 2023-09-08 | Disposition: A | Discharge status: Home / Self Care | Source: Ambulatory Visit | Attending: Urology | Admitting: Urology

## 2023-09-08 ENCOUNTER — Encounter (HOSPITAL_COMMUNITY): Payer: Self-pay

## 2023-09-08 MED ORDER — GEMCITABINE CHEMO FOR BLADDER INSTILLATION 2000 MG
2000.0000 mg | Freq: Once | INTRAVENOUS | Status: AC
  Administered 2023-09-09: 2000 mg via INTRAVESICAL
  Filled 2023-09-08: qty 52.6

## 2023-09-09 ENCOUNTER — Ambulatory Visit (HOSPITAL_COMMUNITY): Admitting: Anesthesiology

## 2023-09-09 ENCOUNTER — Other Ambulatory Visit: Payer: Self-pay | Admitting: Cardiology

## 2023-09-09 ENCOUNTER — Ambulatory Visit (HOSPITAL_COMMUNITY)
Admission: RE | Admit: 2023-09-09 | Discharge: 2023-09-09 | Disposition: A | Discharge status: Home / Self Care | Source: Ambulatory Visit | Attending: Urology | Admitting: Urology

## 2023-09-09 ENCOUNTER — Encounter (HOSPITAL_COMMUNITY): Payer: Self-pay | Admitting: Urology

## 2023-09-09 ENCOUNTER — Encounter (HOSPITAL_COMMUNITY)
Admission: RE | Disposition: A | Discharge status: Home / Self Care | Payer: Self-pay | Source: Ambulatory Visit | Attending: Urology

## 2023-09-09 DIAGNOSIS — C672 Malignant neoplasm of lateral wall of bladder: Secondary | ICD-10-CM | POA: Diagnosis not present

## 2023-09-09 DIAGNOSIS — I252 Old myocardial infarction: Secondary | ICD-10-CM | POA: Diagnosis not present

## 2023-09-09 DIAGNOSIS — D090 Carcinoma in situ of bladder: Secondary | ICD-10-CM | POA: Insufficient documentation

## 2023-09-09 DIAGNOSIS — C679 Malignant neoplasm of bladder, unspecified: Secondary | ICD-10-CM | POA: Diagnosis present

## 2023-09-09 DIAGNOSIS — I110 Hypertensive heart disease with heart failure: Secondary | ICD-10-CM | POA: Insufficient documentation

## 2023-09-09 DIAGNOSIS — I5032 Chronic diastolic (congestive) heart failure: Secondary | ICD-10-CM | POA: Diagnosis not present

## 2023-09-09 DIAGNOSIS — J449 Chronic obstructive pulmonary disease, unspecified: Secondary | ICD-10-CM | POA: Diagnosis not present

## 2023-09-09 DIAGNOSIS — Z8673 Personal history of transient ischemic attack (TIA), and cerebral infarction without residual deficits: Secondary | ICD-10-CM | POA: Insufficient documentation

## 2023-09-09 DIAGNOSIS — N329 Bladder disorder, unspecified: Secondary | ICD-10-CM | POA: Diagnosis not present

## 2023-09-09 DIAGNOSIS — I2510 Atherosclerotic heart disease of native coronary artery without angina pectoris: Secondary | ICD-10-CM | POA: Insufficient documentation

## 2023-09-09 DIAGNOSIS — I509 Heart failure, unspecified: Secondary | ICD-10-CM | POA: Insufficient documentation

## 2023-09-09 DIAGNOSIS — E119 Type 2 diabetes mellitus without complications: Secondary | ICD-10-CM | POA: Insufficient documentation

## 2023-09-09 DIAGNOSIS — G894 Chronic pain syndrome: Secondary | ICD-10-CM

## 2023-09-09 HISTORY — PX: TRANSURETHRAL RESECTION OF BLADDER TUMOR: SHX2575

## 2023-09-09 LAB — GLUCOSE, CAPILLARY
Glucose-Capillary: 96 mg/dL (ref 70–99)
Glucose-Capillary: 116 mg/dL — ABNORMAL HIGH (ref 70–99)

## 2023-09-09 SURGERY — TURBT (TRANSURETHRAL RESECTION OF BLADDER TUMOR)
Anesthesia: General | Site: Bladder

## 2023-09-09 MED ORDER — ONDANSETRON HCL 4 MG/2ML IJ SOLN: 4.0000 mg | Freq: Once | INTRAMUSCULAR | Status: DC | PRN

## 2023-09-09 MED ORDER — ONDANSETRON HCL 4 MG/2ML IJ SOLN
INTRAMUSCULAR | Status: DC | PRN
  Administered 2023-09-09: 4 mg via INTRAVENOUS

## 2023-09-09 MED ORDER — FENTANYL CITRATE (PF) 100 MCG/2ML IJ SOLN
INTRAMUSCULAR | Status: DC | PRN
  Administered 2023-09-09 (×2): 50 ug via INTRAVENOUS

## 2023-09-09 MED ORDER — PHENYLEPHRINE HCL-NACL 20-0.9 MG/250ML-% IV SOLN
INTRAVENOUS | Status: AC
  Filled 2023-09-09: qty 250

## 2023-09-09 MED ORDER — PHENYLEPHRINE 80 MCG/ML (10ML) SYRINGE FOR IV PUSH (FOR BLOOD PRESSURE SUPPORT)
PREFILLED_SYRINGE | INTRAVENOUS | Status: AC
Start: 2023-09-09 — End: 2023-09-09
  Filled 2023-09-09: qty 10

## 2023-09-09 MED ORDER — CEFAZOLIN SODIUM-DEXTROSE 2-4 GM/100ML-% IV SOLN
INTRAVENOUS | Status: AC
  Filled 2023-09-09: qty 100

## 2023-09-09 MED ORDER — ORAL CARE MOUTH RINSE: 15.0000 mL | Freq: Once | OROMUCOSAL | Status: AC

## 2023-09-09 MED ORDER — FENTANYL CITRATE PF 50 MCG/ML IJ SOSY: 25.0000 ug | PREFILLED_SYRINGE | INTRAMUSCULAR | Status: DC | PRN

## 2023-09-09 MED ORDER — ACETAMINOPHEN 10 MG/ML IV SOLN
INTRAVENOUS | Status: DC | PRN
  Administered 2023-09-09: 1000 mg via INTRAVENOUS

## 2023-09-09 MED ORDER — ROCURONIUM BROMIDE 10 MG/ML (PF) SYRINGE
PREFILLED_SYRINGE | INTRAVENOUS | Status: AC
Start: 2023-09-09 — End: 2023-09-09
  Filled 2023-09-09: qty 10

## 2023-09-09 MED ORDER — LIDOCAINE 2% (20 MG/ML) 5 ML SYRINGE
INTRAMUSCULAR | Status: DC | PRN
  Administered 2023-09-09: 80 mg via INTRAVENOUS

## 2023-09-09 MED ORDER — OXYCODONE HCL 5 MG/5ML PO SOLN: 5.0000 mg | Freq: Once | ORAL | Status: DC | PRN

## 2023-09-09 MED ORDER — STERILE WATER FOR IRRIGATION IR SOLN
Status: DC | PRN
  Administered 2023-09-09: 500 mL

## 2023-09-09 MED ORDER — LIDOCAINE 2% (20 MG/ML) 5 ML SYRINGE
INTRAMUSCULAR | Status: AC
  Filled 2023-09-09: qty 5

## 2023-09-09 MED ORDER — PROPOFOL 10 MG/ML IV BOLUS
INTRAVENOUS | Status: DC | PRN
  Administered 2023-09-09: 140 mg via INTRAVENOUS

## 2023-09-09 MED ORDER — ACETAMINOPHEN 10 MG/ML IV SOLN
INTRAVENOUS | Status: AC
  Filled 2023-09-09: qty 100

## 2023-09-09 MED ORDER — PROPOFOL 10 MG/ML IV BOLUS
INTRAVENOUS | Status: AC
  Filled 2023-09-09: qty 20

## 2023-09-09 MED ORDER — LACTATED RINGERS IV SOLN: INTRAVENOUS | Status: DC

## 2023-09-09 MED ORDER — ONDANSETRON HCL 4 MG/2ML IJ SOLN
INTRAMUSCULAR | Status: AC
  Filled 2023-09-09: qty 2

## 2023-09-09 MED ORDER — CEFAZOLIN SODIUM-DEXTROSE 2-4 GM/100ML-% IV SOLN
2.0000 g | INTRAVENOUS | Status: AC
  Administered 2023-09-09: 2 g via INTRAVENOUS

## 2023-09-09 MED ORDER — SODIUM CHLORIDE 0.9 % IR SOLN
Status: DC | PRN
  Administered 2023-09-09: 3000 mL

## 2023-09-09 MED ORDER — MIDAZOLAM HCL 2 MG/2ML IJ SOLN
INTRAMUSCULAR | Status: AC
  Administered 2023-09-09: 1 mg via INTRAVENOUS
  Filled 2023-09-09: qty 2

## 2023-09-09 MED ORDER — CHLORHEXIDINE GLUCONATE 0.12 % MT SOLN
15.0000 mL | Freq: Once | OROMUCOSAL | Status: AC
  Administered 2023-09-09: 15 mL via OROMUCOSAL

## 2023-09-09 MED ORDER — EPHEDRINE 5 MG/ML INJ
INTRAVENOUS | Status: AC
  Filled 2023-09-09: qty 5

## 2023-09-09 MED ORDER — FENTANYL CITRATE (PF) 100 MCG/2ML IJ SOLN
INTRAMUSCULAR | Status: AC
  Filled 2023-09-09: qty 2

## 2023-09-09 MED ORDER — MIDAZOLAM HCL 2 MG/2ML IJ SOLN
INTRAMUSCULAR | Status: AC
Start: 2023-09-09 — End: 2023-09-09
  Filled 2023-09-09: qty 2

## 2023-09-09 MED ORDER — MIDAZOLAM HCL 2 MG/2ML IJ SOLN
1.0000 mg | INTRAMUSCULAR | Status: AC | PRN
  Administered 2023-09-09: 1 mg via INTRAVENOUS

## 2023-09-09 MED ORDER — HYDROCODONE-ACETAMINOPHEN 10-325 MG PO TABS: 1.0000 | ORAL_TABLET | Freq: Four times a day (QID) | ORAL | 0 refills | Status: DC | PRN

## 2023-09-09 MED ORDER — ALBUTEROL SULFATE HFA 108 (90 BASE) MCG/ACT IN AERS
INHALATION_SPRAY | RESPIRATORY_TRACT | Status: AC
  Filled 2023-09-09: qty 6.7

## 2023-09-09 MED ORDER — DIPHENHYDRAMINE HCL 50 MG/ML IJ SOLN
INTRAMUSCULAR | Status: DC | PRN
  Administered 2023-09-09: 12.5 mg via INTRAVENOUS

## 2023-09-09 MED ORDER — ALBUTEROL SULFATE HFA 108 (90 BASE) MCG/ACT IN AERS
INHALATION_SPRAY | RESPIRATORY_TRACT | Status: DC | PRN
  Administered 2023-09-09: 4 via RESPIRATORY_TRACT

## 2023-09-09 MED ORDER — ROCURONIUM BROMIDE 10 MG/ML (PF) SYRINGE
PREFILLED_SYRINGE | INTRAVENOUS | Status: DC | PRN
  Administered 2023-09-09: 80 mg via INTRAVENOUS

## 2023-09-09 MED ORDER — DIPHENHYDRAMINE HCL 50 MG/ML IJ SOLN
INTRAMUSCULAR | Status: AC
Start: 2023-09-09 — End: 2023-09-09
  Filled 2023-09-09: qty 1

## 2023-09-09 MED ORDER — OXYCODONE HCL 5 MG PO TABS: 5.0000 mg | ORAL_TABLET | Freq: Once | ORAL | Status: DC | PRN

## 2023-09-09 MED ORDER — DEXMEDETOMIDINE HCL IN NACL 80 MCG/20ML IV SOLN
INTRAVENOUS | Status: DC | PRN
  Administered 2023-09-09: 12 ug via INTRAVENOUS
  Administered 2023-09-09: 8 ug via INTRAVENOUS

## 2023-09-09 MED ORDER — SUGAMMADEX SODIUM 200 MG/2ML IV SOLN
INTRAVENOUS | Status: DC | PRN
  Administered 2023-09-09: 300 mg via INTRAVENOUS

## 2023-09-09 SURGICAL SUPPLY — 20 items
BAG DRAIN URO TABLE W/ADPT NS (BAG) ×1 IMPLANT
BAG HAMPER (MISCELLANEOUS) ×1 IMPLANT
BAG URINE DRAIN 2000ML AR STRL (UROLOGICAL SUPPLIES) ×1 IMPLANT
CATH FOLEY LF 20FR (CATHETERS) IMPLANT
CLOTH BEACON ORANGE TIMEOUT ST (SAFETY) ×1 IMPLANT
ELECTRODE LOOP 22F BIPOLAR SML (ELECTROSURGICAL) ×1 IMPLANT
GLOVE BIO SURGEON STRL SZ8 (GLOVE) ×1 IMPLANT
GLOVE BIOGEL PI IND STRL 7.0 (GLOVE) ×2 IMPLANT
GOWN STRL REUS W/TWL LRG LVL3 (GOWN DISPOSABLE) ×1 IMPLANT
GOWN STRL REUS W/TWL XL LVL3 (GOWN DISPOSABLE) ×1 IMPLANT
KIT CHEMO SPILL (MISCELLANEOUS) ×1 IMPLANT
KIT TURNOVER CYSTO (KITS) ×1 IMPLANT
PACK CYSTO (CUSTOM PROCEDURE TRAY) ×1 IMPLANT
PAD ARMBOARD POSITIONER FOAM (MISCELLANEOUS) ×1 IMPLANT
PLUG CATH AND CAP STER (CATHETERS) IMPLANT
POSITIONER HEAD 8X9X4 ADT (SOFTGOODS) ×1 IMPLANT
SOL .9 NS 3000ML IRR UROMATIC (IV SOLUTION) ×2 IMPLANT
SYR 30ML LL (SYRINGE) ×1 IMPLANT
SYRINGE TOOMEY IRRIG 70ML (MISCELLANEOUS) ×1 IMPLANT
TOWEL OR 17X26 4PK STRL BLUE (TOWEL DISPOSABLE) ×1 IMPLANT

## 2023-09-09 NOTE — Anesthesia Procedure Notes (Addendum)
 Procedure Name: Intubation Date/Time: 09/09/2023 10:23 AM  Performed by: Kendell Yvonna PARAS, MDPre-anesthesia Checklist: Patient identified, Emergency Drugs available, Suction available and Patient being monitored Patient Re-evaluated:Patient Re-evaluated prior to induction Oxygen  Delivery Method: Circle system utilized Preoxygenation: Pre-oxygenation with 100% oxygen  Induction Type: Combination inhalational/ intravenous induction Ventilation: Oral airway inserted - appropriate to patient size and Mask ventilation without difficulty Laryngoscope Size: Mac and 4 Grade View: Grade II Tube type: Oral Tube size: 7.0 mm Number of attempts: 1 Airway Equipment and Method: Stylet Placement Confirmation: ETT inserted through vocal cords under direct vision, positive ETCO2, CO2 detector and breath sounds checked- equal and bilateral Secured at: 21 cm Tube secured with: Tape Dental Injury: Teeth and Oropharynx as per pre-operative assessment  Comments: Missing upper and lower dentition. Partials removed and given to family member. Atraumatic intubation. Patient with adhesive tape allergy. Strip of paper tape applied above region between upper lip and below nose. Pink adhesive tape secure OETT and anchored on top of paper tape. Lips and teeth remain in preoperative condition.

## 2023-09-09 NOTE — Progress Notes (Signed)
 No  court appointed guaardian

## 2023-09-09 NOTE — Interval H&P Note (Signed)
 History and Physical Interval Note:  09/09/2023 8:48 AM  [name]  has presented today for surgery, with the diagnosis of bladder lesion.  The various methods of treatment have been discussed with the patient and family. After consideration of risks, benefits and other options for treatment, the patient has consented to  Procedure(s) with comments: TURBT (TRANSURETHRAL RESECTION OF BLADDER TUMOR) (N/A) - with gemcitabine  instillation as a surgical intervention.  The patient's history has been reviewed, patient examined, no change in status, stable for surgery.  I have reviewed the patient's chart and labs.  Questions were answered to the patient's satisfaction.     [name]

## 2023-09-09 NOTE — Transfer of Care (Signed)
 Immediate Anesthesia Transfer of Care Note  Patient: [name]  Procedure(s) Performed: TURBT (TRANSURETHRAL RESECTION OF BLADDER TUMOR) (Bladder)  Patient Location: PACU  Anesthesia Type:General  Level of Consciousness: awake and drowsy  Airway & Oxygen  Therapy: Patient Spontanous Breathing and Patient connected to nasal cannula oxygen   Post-op Assessment: Report given to RN and Post -op Vital signs reviewed and stable  Post vital signs: Reviewed and stable  Last Vitals:  Vitals Value Taken Time  BP 149/75 09/09/23 10:53  Temp 36.5 C 09/09/23 10:53  Pulse 85 09/09/23 10:57  Resp 18 09/09/23 10:57  SpO2 98 % 09/09/23 10:57  Vitals shown include unfiled device data.  Last Pain:  Vitals:   09/09/23 0900  PainSc: 10-Worst pain ever      Patients Stated Pain Goal: 9 (09/09/23 0900)  Complications: No notable events documented.

## 2023-09-09 NOTE — Op Note (Signed)
 Preoperative diagnosis: Bladder cancer   Postoperative diagnosis: Same   Procedure: 1 cystoscopy 2. Transurethral resection of bladder tumor, small 3. Instillation of bladder chemotherapy agent   Attending: Belvie Standing   Anesthesia: General   Estimated blood loss: Minimal   Drains: 18 French foley   Specimens: bladder tumor   Antibiotics: ancef    Findings: multiple 1cm erythematous lesions at previous resection site.  Ureteral orifices in normal anatomic location.    Indications: Patient is a 73 year old female with T1G3 bladder cancer here bladder tumor resection for erythematous lesions arising from the previous resection site.  After discussing treatment options, they decided proceed with transurethral resection of a bladder tumor.   Procedure in detail: The patient was brought to the operating room and a brief timeout was done to ensure correct patient, correct procedure, correct site.  General anesthesia was administered patient was placed in dorsal lithotomy position.  Their genitalia was then prepped and draped in usual sterile fashion.  A rigid 22 French cystoscope was passed in the urethra and the bladder.  Bladder was inspected and we noted multiple 1cm erythematous lesions at her previous resection site.  the ureteral orifices were in the normal orthotopic locations.  We then removed the cystoscope and placed a resectoscope into the bladder. Using the bipolar resectoscope we removed the bladder tumor down to the base. Hemostasis was then obtained with electrocautery. We then removed the bladder tumor chips and sent them for pathology. We then re-inspected the bladder and found no residual bleeding.  the bladder was then drained, a 18 French foley was placed and 2g of gemcitabine  was instilled in to the bladder. This concluded the procedure which was well tolerated by patient.   Complications: None   Condition: Stable, extubated, transferred to PACU   Plan: Patient is to  have the foley drained in 1 hour and be discharged home after foley removal. She will followup in 1 week for pathology discussion

## 2023-09-10 ENCOUNTER — Encounter (HOSPITAL_COMMUNITY): Payer: Self-pay | Admitting: Urology

## 2023-09-10 ENCOUNTER — Telehealth: Payer: Self-pay | Admitting: Physical Medicine & Rehabilitation

## 2023-09-10 DIAGNOSIS — G894 Chronic pain syndrome: Secondary | ICD-10-CM

## 2023-09-10 LAB — SURGICAL PATHOLOGY

## 2023-09-10 NOTE — Anesthesia Preprocedure Evaluation (Signed)
 Anesthesia Evaluation  Patient identified by MRN, date of birth, ID band Patient awake    Reviewed: Allergy & Precautions, H&P , NPO status , Patient's Chart, lab work & pertinent test results, reviewed documented beta blocker date and time   Airway Mallampati: II  TM Distance: >3 FB Neck ROM: full    Dental no notable dental hx.    Pulmonary COPD, Current Smoker   Pulmonary exam normal breath sounds clear to auscultation       Cardiovascular Exercise Tolerance: Good hypertension, + CAD, + Past MI and +CHF  + dysrhythmias + Valvular Problems/Murmurs  Rhythm:regular Rate:Normal     Neuro/Psych  Headaches PSYCHIATRIC DISORDERS Anxiety Depression     Neuromuscular disease CVA    GI/Hepatic hiatal hernia,GERD  ,,(+) Hepatitis -  Endo/Other  diabetesHypothyroidism    Renal/GU negative Renal ROS  negative genitourinary   Musculoskeletal   Abdominal   Peds  Hematology negative hematology ROS (+)   Anesthesia Other Findings   Reproductive/Obstetrics negative OB ROS                             Anesthesia Physical Anesthesia Plan  ASA: 3  Anesthesia Plan: General   Post-op Pain Management:    Induction:   PONV Risk Score and Plan: Propofol  infusion  Airway Management Planned:   Additional Equipment:   Intra-op Plan:   Post-operative Plan:   Informed Consent: I have reviewed the patients History and Physical, chart, labs and discussed the procedure including the risks, benefits and alternatives for the proposed anesthesia with the patient or authorized representative who has indicated his/her understanding and acceptance.     Dental Advisory Given  Plan Discussed with: CRNA  Anesthesia Plan Comments:        Anesthesia Quick Evaluation

## 2023-09-10 NOTE — Telephone Encounter (Signed)
 P called for a med refill of hydrocodone  she stated patrick her other doctor called in refill she didn't ask for yesterday. She said she was going to call him and say she didn't need it yuri normally fills this

## 2023-09-10 NOTE — Anesthesia Postprocedure Evaluation (Signed)
 Anesthesia Post Note  Patient: ASHLI SELDERS  Procedure(s) Performed: TURBT (TRANSURETHRAL RESECTION OF BLADDER TUMOR) (Bladder)  Patient location during evaluation: Phase II Anesthesia Type: General Level of consciousness: awake Pain management: pain level controlled Vital Signs Assessment: post-procedure vital signs reviewed and stable Respiratory status: spontaneous breathing and respiratory function stable Cardiovascular status: blood pressure returned to baseline and stable Postop Assessment: no headache and no apparent nausea or vomiting Anesthetic complications: no Comments: Late entry   No notable events documented.   Last Vitals:  Vitals:   09/09/23 1145 09/09/23 1156  BP: (!) 148/71 136/74  Pulse: 61 65  Resp: (!) 0 16  Temp:  36.5 C  SpO2: 98% 94%    Last Pain:  Vitals:   09/09/23 1156  TempSrc: Oral  PainSc: 9                  Yvonna JINNY [name]

## 2023-09-11 ENCOUNTER — Encounter (HOSPITAL_COMMUNITY): Payer: Self-pay | Admitting: Interventional Radiology

## 2023-09-14 MED ORDER — HYDROCODONE-ACETAMINOPHEN 10-325 MG PO TABS: 1.0000 | ORAL_TABLET | Freq: Four times a day (QID) | ORAL | 0 refills | Status: DC | PRN

## 2023-09-20 ENCOUNTER — Telehealth: Payer: Self-pay | Admitting: Urology

## 2023-09-20 NOTE — Telephone Encounter (Signed)
 Patient called the office today in regard to surgery questions and/or concerns.  Patient surgery date? 09/09/23 Type of surgery? Bladder cancer surgery What is best contact number to reach the patient at? [phone] Were you given pain medication prescription at time of discharge? Yes  What Symptoms is patient experiencing? Fever- No Nausea- No Vomiting- No Hematuria- Yes  - started yesterday and its heavier today .  She has a lot of pressure when she is trying to urinate.   Unable to void: No

## 2023-09-21 ENCOUNTER — Other Ambulatory Visit: Payer: Self-pay

## 2023-09-21 NOTE — Telephone Encounter (Signed)
 FYI-Patient has concerns with bleeding after surgery.  She states she started her Plavix  back after surgery and was concerned with increased bleeding so she has stopped it.  I informed her that we only had asa 81 listed in her med list, she states her PCP had retired and had just got in at Dr. Lemmie office and she had not been receiving her plavix  from the pharmacy due to a mix up with her medications while she was in between primary care doctors.  I advised her to reach out to her cardiologist in regards to holding her plavix  and informed her that I would relay this information to Dr. Sherrilee and she should keep her follow up with our office as scheduled tomorrow at 8:40 am.  Patient voiced understanding.

## 2023-09-22 ENCOUNTER — Ambulatory Visit: Admitting: Urology

## 2023-09-22 ENCOUNTER — Encounter: Payer: Self-pay | Admitting: Urology

## 2023-09-22 VITALS — BP 128/74 | HR 83

## 2023-09-22 DIAGNOSIS — N390 Urinary tract infection, site not specified: Secondary | ICD-10-CM | POA: Diagnosis not present

## 2023-09-22 DIAGNOSIS — C672 Malignant neoplasm of lateral wall of bladder: Secondary | ICD-10-CM | POA: Diagnosis not present

## 2023-09-22 DIAGNOSIS — R399 Unspecified symptoms and signs involving the genitourinary system: Secondary | ICD-10-CM

## 2023-09-22 LAB — URINALYSIS, ROUTINE W REFLEX MICROSCOPIC
Bilirubin, UA: NEGATIVE
Ketones, UA: NEGATIVE
Leukocytes,UA: NEGATIVE
Nitrite, UA: NEGATIVE
Specific Gravity, UA: 1.01 (ref 1.005–1.030)
Urobilinogen, Ur: 0.2 mg/dL (ref 0.2–1.0)
pH, UA: 6 (ref 5.0–7.5)

## 2023-09-22 LAB — MICROSCOPIC EXAMINATION: RBC, Urine: >30 /HPF — AB (ref 0–2)

## 2023-09-22 MED ORDER — NITROFURANTOIN MONOHYD MACRO 100 MG PO CAPS
100.0000 mg | ORAL_CAPSULE | Freq: Two times a day (BID) | ORAL | 0 refills | Status: DC
Start: 2023-09-22 — End: 2023-12-03

## 2023-09-22 NOTE — Progress Notes (Signed)
 09/22/2023 9:06 AM   [name] 1951-01-27 [id number]  Referring provider: Shona Norleen PEDLAR, MD [address] [name],  [place] [address]  Followup bladder cancer   HPI: [name] is a 73yo here for followup for bladder cancer.  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 Starting Monday she developed gross hematuria with increased pelvic pain and pressure. UA is concerning for infection.    PMH: Past Medical History:  Diagnosis Date   Anxiety    Arthritis    hands, arms, back, neck, knees, fingers (01/21/2016)   Bradycardia    Breast cancer, left breast (HCC) 1970s; 2015   CAD (coronary artery disease)    a. s/p NSTEMI in 2017 with DES to OM and unsuccessful PCI to RCA with wire dissection b. 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   History of hiatal hernia    History of kidney stones    Hyperlipidemia    Hypertension    Hypothyroidism    Long term prescription benzodiazepine use 12/10/2022   Melanoma of forearm, left (HCC)    Migraine    a few migraines/year (01/21/2016)   Obesity    Polypharmacy 12/10/2022   Stroke (HCC) 04/2003   2 carotid artery stents in place; small left parietal and left hemispheric CVA.hazeline 01/21/2016   Tobacco use disorder    50 pack years; questionably discontinued in 2010   Wears glasses     Surgical History: Past Surgical History:  Procedure Laterality Date   ABDOMINAL HYSTERECTOMY  1978   APPENDECTOMY     BIOPSY  04/28/2022   Procedure: BIOPSY;  Surgeon: Eartha Angelia Sieving, MD;  [facility];  Service: Gastroenterology;;   BLADDER  INSTILLATION N/A 04/29/2023   Procedure: BLADDER INSTILLATION- gemcitabine ;  Surgeon: Sherrilee Belvie CROME, MD;  Location: [facility];  Service: Urology;  Laterality: N/A;   BREAST BIOPSY Left 1970s; 2015   BREAST LUMPECTOMY Left 1970s   BREAST LUMPECTOMY WITH NEEDLE LOCALIZATION Left 07/07/2013   Procedure: BREAST LUMPECTOMY WITH NEEDLE LOCALIZATION;  Surgeon: Deward GORMAN Curvin DOUGLAS, MD;  Location: [facility];  Service: General;  Laterality: Left;   CARDIAC CATHETERIZATION N/A 01/22/2016   Procedure: Left Heart Cath and Coronary Angiography;  Surgeon: Peter M Swaziland, MD;  Location: [facility] INVASIVE CV LAB;  Service: Cardiovascular;  Laterality: N/A;   CARDIAC CATHETERIZATION N/A 01/22/2016   Procedure: Coronary Stent Intervention;  Surgeon: Peter M Swaziland, MD;  Location: [facility] INVASIVE CV LAB;  Service: Cardiovascular;  Laterality: N/A;   CAROTID STENT Left 04/2003   small left parietal and left hemispheric CVA/notes 07/29/2010   CHOLECYSTECTOMY OPEN     COLONOSCOPY  2011   COLONOSCOPY N/A 05/10/2019   Procedure: COLONOSCOPY;  Surgeon: Golda Claudis PENNER, MD;  [facility];  Service: Endoscopy;  Laterality: N/A;  1:45   COLONOSCOPY WITH PROPOFOL  N/A 09/01/2022   Procedure: COLONOSCOPY WITH PROPOFOL ;  Surgeon: Eartha Angelia Sieving, MD;  [facility];  Service: Gastroenterology;  Laterality: N/A;  1:45PM;ASA 3   CORONARY ATHERECTOMY N/A 12/29/2019   Procedure: CORONARY ATHERECTOMY;  Surgeon: Dann Candyce RAMAN, MD;  Location: [facility] INVASIVE CV LAB;  Service: Cardiovascular;  Laterality: N/A;  Prox RCA   CORONARY STENT INTERVENTION N/A 12/29/2019   Procedure: CORONARY STENT INTERVENTION;  Surgeon: Dann Candyce RAMAN, MD;  Location: [facility] INVASIVE CV LAB;  Service: Cardiovascular;  Laterality: N/A;  Prox RCA   CORONARY ULTRASOUND/IVUS N/A 12/29/2019   Procedure: Intravascular Ultrasound/IVUS;  Surgeon: Dann Candyce RAMAN, MD;  Location: [facility] INVASIVE CV LAB;  Service:  Cardiovascular;  Laterality: N/A;   CYSTOSCOPY WITH BIOPSY N/A 04/29/2023   Procedure: CYSTOSCOPY WITH BIOPSY;  Surgeon: Sherrilee Belvie CROME, MD;  Location: [facility];  Service: Urology;  Laterality: N/A;   ESOPHAGEAL DILATION N/A 04/28/2022   Procedure: ESOPHAGEAL DILATION;  Surgeon: Eartha Angelia Sieving, MD;  [facility];  Service: Gastroenterology;  Laterality: N/A;   ESOPHAGOGASTRODUODENOSCOPY  08/06/2011   Procedure: ESOPHAGOGASTRODUODENOSCOPY (EGD);  Surgeon: Claudis RAYMOND Rivet, MD;  [facility];  Service: Endoscopy;  Laterality: N/A;   ESOPHAGOGASTRODUODENOSCOPY N/A 05/10/2019   Procedure: ESOPHAGOGASTRODUODENOSCOPY (EGD);  Surgeon: Rivet Claudis RAYMOND, MD;  [facility];  Service: Endoscopy;  Laterality: N/A;   ESOPHAGOGASTRODUODENOSCOPY (EGD) WITH PROPOFOL  N/A 04/28/2022   Procedure: ESOPHAGOGASTRODUODENOSCOPY (EGD) WITH PROPOFOL ;  Surgeon: Eartha Angelia Sieving, MD;  [facility];  Service: Gastroenterology;  Laterality: N/A;  10:30 am, pt can't move up due to transportation   FLEXIBLE SIGMOIDOSCOPY  08/06/2011   Procedure: FLEXIBLE SIGMOIDOSCOPY;  Surgeon: Claudis RAYMOND Rivet, MD;  [facility];  Service: Endoscopy;  Laterality: N/A;   HEMOSTASIS CLIP PLACEMENT  09/01/2022   Procedure: HEMOSTASIS CLIP PLACEMENT;  Surgeon: Eartha Angelia Sieving, MD;  [facility];  Service: Gastroenterology;;   INGUINAL HERNIA REPAIR Left 1970s   IR ANGIO INTRA EXTRACRAN SEL COM CAROTID INNOMINATE BILAT MOD SED  11/01/2020   IR ANGIO VERTEBRAL SEL VERTEBRAL BILAT MOD SED  11/01/2020   IR RADIOLOGIST EVAL & MGMT  09/23/2020   IR US  GUIDE VASC ACCESS RIGHT  11/01/2020   LARYNX SURGERY  1970s   Polyps excised   LEFT HEART CATH AND CORONARY ANGIOGRAPHY N/A 12/29/2019   Procedure: LEFT HEART CATH AND CORONARY ANGIOGRAPHY;  Surgeon: Dann Candyce RAMAN, MD;  Location: [facility] INVASIVE CV LAB;  Service: Cardiovascular;  Laterality: N/A;   LEFT  HEART CATH AND CORONARY ANGIOGRAPHY N/A 10/12/2022   Procedure: LEFT HEART CATH AND CORONARY ANGIOGRAPHY;  Surgeon: Dann Candyce RAMAN, MD;  Location: [facility] INVASIVE CV LAB;  Service: Cardiovascular;  Laterality: N/A;   MALONEY DILATION  05/10/2019   Procedure: AGAPITO DILATION;  Surgeon: Rivet Claudis RAYMOND, MD;  [facility];  Service: Endoscopy;;   MELANOMA EXCISION Left ~ 2016   forearm   POLYPECTOMY  05/10/2019   Procedure: POLYPECTOMY;  Surgeon: Rivet Claudis RAYMOND, MD;  [facility];  Service: Endoscopy;;  colon   POLYPECTOMY  09/01/2022   Procedure: POLYPECTOMY INTESTINAL;  Surgeon: Eartha Angelia Sieving, MD;  [facility];  Service: Gastroenterology;;   SPLENECTOMY, 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   spontaneous rupture   SPLENECTOMY, TOTAL     SUBMUCOSAL LIFTING INJECTION  09/01/2022   Procedure: SUBMUCOSAL LIFTING INJECTION;  Surgeon: Eartha Angelia Sieving, MD;  [facility];  Service: Gastroenterology;;   SUBMUCOSAL TATTOO INJECTION  09/01/2022   Procedure: SUBMUCOSAL TATTOO INJECTION;  Surgeon: Eartha Angelia Sieving, MD;  [facility];  Service:  Gastroenterology;;   TEMPORARY PACEMAKER N/A 12/29/2019   Procedure: TEMPORARY PACEMAKER;  Surgeon: Dann Candyce RAMAN, MD;  Location: [facility] INVASIVE CV LAB;  Service: Cardiovascular;  Laterality: N/A;   TRANSURETHRAL RESECTION OF BLADDER TUMOR N/A 03/15/2023   Procedure: TRANSURETHRAL RESECTION OF BLADDER TUMOR (TURBT)with Gemcitibane;  Surgeon: Sherrilee Belvie CROME, MD;  Location: [facility];  Service: Urology;  Laterality: N/A;   TRANSURETHRAL RESECTION OF BLADDER TUMOR N/A 04/29/2023   Procedure: TRANSURETHRAL RESECTION OF BLADDER TUMOR (TURBT);  Surgeon: Sherrilee Belvie CROME, MD;  Location: [facility];  Service: Urology;  Laterality: N/A;   TRANSURETHRAL RESECTION OF BLADDER TUMOR N/A 09/09/2023   Procedure: TURBT (TRANSURETHRAL RESECTION OF BLADDER TUMOR);  Surgeon: Sherrilee Belvie CROME, MD;  Location: [facility];  Service: Urology;  Laterality: N/A;  with gemcitabine  instillation    Home Medications:  Allergies as of 09/22/2023       Reactions   Gabapentin  Nausea And Vomiting   Other Reaction(s): Not available   Prednisone  Palpitations   Other Reaction(s): Not available   Carvedilol     Stopped due to bradycardia   Clonidine Derivatives    Stopped due to bradycardia   Diltiazem     Stopped due to bradycardia   Tape Other (See Comments)   Causes blisters to form   Latex Other (See Comments), Rash   Blisters Other Reaction(s): Not available        Medication List        Accurate as of September 22, 2023  9:06 AM. If you have any questions, ask your nurse or doctor.          albuterol  108 (90 Base) MCG/ACT inhaler Commonly known as: VENTOLIN  HFA Inhale 1-2 puffs into the lungs every 6 (six) hours as needed for wheezing or shortness of breath.   amLODipine  5 MG tablet Commonly known as: NORVASC  Take 5 mg by mouth in the morning.   aspirin  EC 81 MG tablet Take 81 mg by mouth in the morning.   azaTHIOprine  50 MG tablet Commonly known as: IMURAN  Take 1 tablet (50 mg total) by mouth daily.   cetirizine 10 MG tablet Commonly known as: ZYRTEC Take 10 mg by mouth in the morning.   clopidogrel  75 MG tablet Commonly known as: PLAVIX  Take 75 mg by mouth daily.   furosemide  40 MG tablet Commonly known as: LASIX  Take 40 mg by mouth daily as needed for edema.   Gemtesa  75 MG Tabs Generic drug: Vibegron  Take 1 tablet (75 mg total) by mouth daily.   HYDROcodone -acetaminophen  10-325 MG tablet Commonly known as: NORCO Take 1 tablet by mouth every 6 (six) hours as needed for moderate pain (pain score 4-6).   Jardiance 10 MG Tabs tablet Generic drug: empagliflozin TAKE (1) TABLET BY MOUTH EACH MORNING.   levothyroxine  50 MCG tablet Commonly known as: SYNTHROID  Take 50 mcg by mouth in the morning.   losartan  25 MG tablet Commonly known as: COZAAR  Take 25 mg by mouth in the  morning.   nitroGLYCERIN  0.4 MG SL tablet Commonly known as: NITROSTAT  Place 0.4 mg under the tongue every 5 (five) minutes x 3 doses as needed for chest pain.   ondansetron  4 MG disintegrating tablet Commonly known as: ZOFRAN -ODT Take 4 mg by mouth every 8 (eight) hours as needed for nausea or vomiting.   pantoprazole  40 MG tablet Commonly known as: PROTONIX  Take 1 tablet (40 mg total) by mouth daily before breakfast.   rosuvastatin  40 MG tablet Commonly known as: CRESTOR  Take 40  mg by mouth daily.   Spiriva  Respimat 2.5 MCG/ACT Aers Generic drug: Tiotropium Bromide  Monohydrate Inhale 2 puffs into the lungs daily as needed (respiratory issues.).   ursodiol  500 MG tablet Commonly known as: Urso  Forte Take 1 tablet (500 mg total) by mouth 2 (two) times daily.   Vitamin D3 50 MCG (2000 UT) Tabs Take 2,000 Units by mouth in the morning.        Allergies:  Allergies  Allergen Reactions   Gabapentin  Nausea And Vomiting    Other Reaction(s): Not available   Prednisone  Palpitations    Other Reaction(s): Not available   Carvedilol      Stopped due to bradycardia   Clonidine Derivatives     Stopped due to bradycardia    Diltiazem      Stopped due to bradycardia    Tape Other (See Comments)    Causes blisters to form   Latex Other (See Comments) and Rash    Blisters  Other Reaction(s): Not available    Family History: Family History  Problem Relation Age of Onset   Heart failure Mother    Cancer Father    Heart failure Father    Cancer Sister    Dementia Sister    Heart disease Other    Arthritis Other    Cancer Other    Diabetes Other    Kidney disease Other    Cancer Sister    Heart failure Brother     Social History:  reports that she has been smoking cigarettes. She has a 20 pack-year smoking history. She has been exposed to tobacco smoke. She has never used smokeless tobacco. She reports that she does not currently use alcohol. She reports that she  does not use drugs.  ROS: All other review of systems were reviewed and are negative except what is noted above in HPI  Physical Exam: BP 128/74   Pulse 83   Constitutional:  Alert and oriented, No acute distress. HEENT: [place] AT, moist mucus membranes.  Trachea midline, no masses. Cardiovascular: No clubbing, cyanosis, or edema. Respiratory: Normal respiratory effort, no increased work of breathing. GI: Abdomen is soft, nontender, nondistended, no abdominal masses GU: No CVA tenderness.  Lymph: No cervical or inguinal lymphadenopathy. Skin: No rashes, bruises or suspicious lesions. Neurologic: Grossly intact, no focal deficits, moving all 4 extremities. Psychiatric: Normal mood and affect.  Laboratory Data: Lab Results  Component Value Date   WBC 12.8 (H) 02/24/2023   HGB 13.5 02/24/2023   HCT 41.7 02/24/2023   MCV 90.5 02/24/2023   PLT 312 02/24/2023    Lab Results  Component Value Date   CREATININE 0.63 02/24/2023    No results found for: PSA  No results found for: TESTOSTERONE  Lab Results  Component Value Date   HGBA1C 7.1 (H) 10/19/2022    Urinalysis    Component Value Date/Time   COLORURINE RED (A) 02/02/2023 1156   APPEARANCEUR Clear 08/23/2023 1102   LABSPEC  02/02/2023 1156    TEST NOT REPORTED DUE TO COLOR INTERFERENCE OF URINE PIGMENT   PHURINE  02/02/2023 1156    TEST NOT REPORTED DUE TO COLOR INTERFERENCE OF URINE PIGMENT   GLUCOSEU 3+ (A) 08/23/2023 1102   HGBUR (A) 02/02/2023 1156    TEST NOT REPORTED DUE TO COLOR INTERFERENCE OF URINE PIGMENT   BILIRUBINUR Negative 08/23/2023 1102   KETONESUR (A) 02/02/2023 1156    TEST NOT REPORTED DUE TO COLOR INTERFERENCE OF URINE PIGMENT   PROTEINUR Negative 08/23/2023 1102  PROTEINUR (A) 02/02/2023 1156    TEST NOT REPORTED DUE TO COLOR INTERFERENCE OF URINE PIGMENT   UROBILINOGEN 1.0 07/01/2023 1121   UROBILINOGEN 0.2 11/03/2014 1722   NITRITE Negative 08/23/2023 1102   NITRITE (A) 02/02/2023  1156    TEST NOT REPORTED DUE TO COLOR INTERFERENCE OF URINE PIGMENT   LEUKOCYTESUR Negative 08/23/2023 1102   LEUKOCYTESUR (A) 02/02/2023 1156    TEST NOT REPORTED DUE TO COLOR INTERFERENCE OF URINE PIGMENT    Lab Results  Component Value Date   LABMICR Comment 08/23/2023   WBCUA 6-10 (A) 05/14/2023   LABEPIT CANCELED 06/10/2023   BACTERIA Moderate (A) 05/14/2023    Pertinent Imaging:  No results found for this or any previous visit.  Results for orders placed during the hospital encounter of 10/05/07  US  VenoUS Imaging Bilateral  Narrative Clinical Data: Left leg pain and swelling  VENOUS DUPLEX ULTRASOUND OF BILATERAL LOWER EXTREMITIES  Technique: Gray-scale sonography with graded compression, as well as color Doppler and duplex ultrasound, were performed to evaluate the deep venous system of both lower extremities from the level of the common femoral vein through the popliteal and proximal calf veins. Spectral Doppler was utilized to evaulate flow at rest and with distal augmentation maneuvers. Confirmed with physician's office that bilateral lower extremity exam requested.  Comparison: None  Findings: Deep venous system patent and compressible from the groin through popliteal fossa bilaterally.SABRA Spontaneous venous flow present with intact augmentation and evidence of respiratory phasicity. No intraluminal thrombus identified. Visualized portions of the greater saphenous systems unremarkable.  IMPRESSION: No evidence of deep venous thrombosis.  Provider: Montie Axe  No results found for this or any previous visit.  No results found for this or any previous visit.  Results for orders placed during the hospital encounter of 02/02/23  US  Renal  Narrative CLINICAL DATA:  Painless hematuria  EXAM: RENAL / URINARY TRACT ULTRASOUND COMPLETE  COMPARISON:  CT renal stone 01/31/2023  FINDINGS: Right Kidney:  Renal measurements: 12.7 x 4.4 x 5.7  cm = volume: 161 mL. Echogenicity within normal limits. No mass or hydronephrosis visualized.  Left Kidney:  Renal measurements: 11.5 x 6.4 x 5.3 cm = volume: 205 mL. Echogenicity within normal limits. No mass or hydronephrosis visualized.  Bladder:  Under distended and not well evaluated.  Other:  None.  IMPRESSION: Normal renal ultrasound.   Electronically Signed By: Greig Pique M.D. On: 02/02/2023 18:00  No results found for this or any previous visit.  Results for orders placed in visit on 02/03/23  CT HEMATURIA WORKUP  Narrative CLINICAL DATA:  Gross hematuria.  EXAM: CT ABDOMEN AND PELVIS WITHOUT AND WITH CONTRAST  TECHNIQUE: Multidetector CT imaging of the abdomen and pelvis was performed following the standard protocol before and following the bolus administration of intravenous contrast.  RADIATION DOSE REDUCTION: This exam was performed according to the departmental dose-optimization program which includes automated exposure control, adjustment of the mA and/or kV according to patient size and/or use of iterative reconstruction technique.  CONTRAST:  OMNIPAQUE  IOHEXOL  300 MG/ML  SOLN  COMPARISON:  CT renal stone protocol from 01/31/2023.  FINDINGS: Lower chest: There is a spiculated marginated 1.6 x 2.1 cm nodule in the right lung lower lobe, which was present but partially imaged on the recent prior exam. There is also a new pleural-based 8 x 14 mm nodule in the right lung lower lobe. Findings are concerning for neoplastic process. Further evaluation with dedicated contrast-enhanced chest CT scan is recommended.  The lung bases are otherwise clear. No pleural effusion or consolidation. The heart is normal in size. No pericardial effusion.  Hepatobiliary: The liver is normal in size. Non-cirrhotic configuration. No suspicious mass. No intrahepatic bile duct dilation. There is moderate dilation of the extrahepatic bile duct, most likely  due to post cholecystectomy status. Gallbladder is surgically absent.  Pancreas: Unremarkable. No pancreatic ductal dilatation or surrounding inflammatory changes.  Spleen: Surgically absent. There is an accessory splenule in the left upper quadrant. There are also multiple smaller hyperattenuating nodules along the left diaphragmatic undersurface, also favored to represent splenules.  Adrenals/Urinary Tract: Adrenal glands are unremarkable.  Non-contrast images: No radiopaque urinary tract calculi.  Kidneys: Symmetric enhancement. No suspicious renal mass.  Urinary Tract Opacification: Adequate.  Collecting Systems and Ureters: No filling defects, masses, strictures, or areas of abnormal dilatation.  Urinary Bladder: Is decompressed precluding optimal assessment. However, note is made of mild bladder wall mucosal hyperenhancement and subtle perivesical fat stranding, concerning for cystitis. Correlate clinically and with urinalysis. No focal mass or bladder calculi.  Stomach/Bowel: No disproportionate dilation of the small or large bowel loops. No evidence of abnormal bowel wall thickening or inflammatory changes. The appendix was not visualized; however there is no acute inflammatory process in the right lower quadrant.  Vascular/Lymphatic: No ascites or pneumoperitoneum. No abdominal or pelvic lymphadenopathy, by size criteria. No aneurysmal dilation of the major abdominal arteries. There are moderate peripheral atherosclerotic vascular calcifications of the aorta and its major branches.  Reproductive: The uterus is surgically absent. No large adnexal mass.  Other: There is a tiny fat containing umbilical hernia. The soft tissues and abdominal wall are otherwise unremarkable.  Musculoskeletal: No suspicious osseous lesions. There are mild multilevel degenerative changes in the visualized spine.  IMPRESSION: *No nephroureterolithiasis or obstructive uropathy. No  suspicious renal, ureteric or urinary bladder mass. Mild urinary bladder wall mucosal hyperenhancement and subtle perivesical fat stranding, concerning for cystitis. Correlate clinically and with urinalysis. *There is a spiculated marginated 1.6 x 2.1 cm nodule in the right lung lower lobe and an additional pleural-based 8 x 14 mm nodule in the right lung lower lobe, which is new since the recent prior exam. Findings are concerning for neoplastic process. Further evaluation with dedicated contrast-enhanced chest CT scan is recommended, unless recently performed. *Multiple other nonacute observations, as described above.  Aortic Atherosclerosis (ICD10-I70.0).   Electronically Signed By: Ree Molt M.D. On: 02/23/2023 14:38  Results for orders placed during the hospital encounter of 01/31/23  CT Renal Stone Study  Narrative CLINICAL DATA:  Lower abdominal and flank pain.  Gross hematuria.  EXAM: CT ABDOMEN AND PELVIS WITHOUT CONTRAST  TECHNIQUE: Multidetector CT imaging of the abdomen and pelvis was performed following the standard protocol without IV contrast.  RADIATION DOSE REDUCTION: This exam was performed according to the departmental dose-optimization program which includes automated exposure control, adjustment of the mA and/or kV according to patient size and/or use of iterative reconstruction technique.  COMPARISON:  08/04/2022  FINDINGS: Lower chest: Pulmonary nodule is seen in the medial right lower lobe, which is incompletely visualized but measures at least 13 mm in diameter. A few other tiny less than 5 mm pulmonary nodules also seen in the right lower lobe.  Hepatobiliary: No mass visualized on this unenhanced exam. Prior cholecystectomy again noted. Dilated common bile duct is unchanged, and without significant intrahepatic biliary ductal dilatation.  Pancreas: No mass or inflammatory process visualized on this unenhanced exam.  Spleen: Prior  splenectomy again  noted with hypertrophied accessory splenule in the surgical bed.  Adrenals/Urinary tract: No evidence of urolithiasis or hydronephrosis. Unremarkable unopacified urinary bladder.  Stomach/Bowel: No evidence of obstruction, inflammatory process, or abnormal fluid collections. Diverticulosis is seen mainly involving the sigmoid colon, however there is no evidence of diverticulitis.  Vascular/Lymphatic: No pathologically enlarged lymph nodes identified. No evidence of abdominal aortic aneurysm.  Reproductive: Prior hysterectomy noted. Adnexal regions are unremarkable in appearance.  Other:  None.  Musculoskeletal:  No suspicious bone lesions identified.  IMPRESSION: No evidence of urolithiasis, hydronephrosis, or other acute findings.  Colonic diverticulosis, without radiographic evidence of diverticulitis.  Several right lower lobe nodules, largest is incompletely visualized but measures at least 13 mm. Chest CT is recommended for further evaluation.   Electronically Signed By: Norleen DELENA Kil M.D. On: 01/31/2023 17:03   Assessment & Plan:    1. Malignant neoplasm of lateral wall of urinary bladder (HCC) (Primary) We will proceed with 6 weeks of BCG and then 3 months after BCG for cystoscopy - Urinalysis, Routine w reflex microscopic  2. UTI -urine for culture -macrobid  100mg  BID for 7 days   No follow-ups on file.  Belvie Clara, MD  [facility] [place]

## 2023-09-22 NOTE — Patient Instructions (Signed)

## 2023-09-24 ENCOUNTER — Emergency Department (HOSPITAL_COMMUNITY)
Admission: EM | Admit: 2023-09-24 | Discharge: 2023-09-25 | Disposition: A | Discharge status: Home / Self Care | Source: Ambulatory Visit | Attending: Emergency Medicine | Admitting: Emergency Medicine

## 2023-09-24 ENCOUNTER — Encounter (HOSPITAL_COMMUNITY): Payer: Self-pay

## 2023-09-24 ENCOUNTER — Emergency Department (HOSPITAL_COMMUNITY)

## 2023-09-24 ENCOUNTER — Other Ambulatory Visit: Payer: Self-pay

## 2023-09-24 DIAGNOSIS — R319 Hematuria, unspecified: Secondary | ICD-10-CM | POA: Insufficient documentation

## 2023-09-24 DIAGNOSIS — Z7982 Long term (current) use of aspirin: Secondary | ICD-10-CM | POA: Insufficient documentation

## 2023-09-24 DIAGNOSIS — Z8551 Personal history of malignant neoplasm of bladder: Secondary | ICD-10-CM | POA: Diagnosis not present

## 2023-09-24 DIAGNOSIS — Z91040 Latex allergy status: Secondary | ICD-10-CM | POA: Diagnosis not present

## 2023-09-24 DIAGNOSIS — R310 Gross hematuria: Secondary | ICD-10-CM

## 2023-09-24 LAB — CBC WITH DIFFERENTIAL/PLATELET
Abs Immature Granulocytes: 0.01 K/uL (ref 0.00–0.07)
Basophils Absolute: 0.1 K/uL (ref 0.0–0.1)
Basophils Relative: 1 %
Eosinophils Absolute: 0.2 K/uL (ref 0.0–0.5)
Eosinophils Relative: 3 %
HCT: 42.7 % (ref 36.0–46.0)
Hemoglobin: 14.1 g/dL (ref 12.0–15.0)
Immature Granulocytes: 0 %
Lymphocytes Relative: 35 %
Lymphs Abs: 2.9 K/uL (ref 0.7–4.0)
MCH: 29.8 pg (ref 26.0–34.0)
MCHC: 33 g/dL (ref 30.0–36.0)
MCV: 90.3 fL (ref 80.0–100.0)
Monocytes Absolute: 1 K/uL (ref 0.1–1.0)
Monocytes Relative: 12 %
Neutro Abs: 4.1 K/uL (ref 1.7–7.7)
Neutrophils Relative %: 49 %
Platelets: 410 K/uL — ABNORMAL HIGH (ref 150–400)
RBC: 4.73 MIL/uL (ref 3.87–5.11)
RDW: 13.8 % (ref 11.5–15.5)
WBC: 8.2 K/uL (ref 4.0–10.5)
nRBC: 0 % (ref 0.0–0.2)

## 2023-09-24 LAB — URINE CULTURE

## 2023-09-24 LAB — URINALYSIS, ROUTINE W REFLEX MICROSCOPIC: Protein, ur: NEGATIVE mg/dL

## 2023-09-24 LAB — COMPREHENSIVE METABOLIC PANEL WITH GFR
ALT: 10 U/L (ref 0–44)
AST: 18 U/L (ref 15–41)
Albumin: 3.8 g/dL (ref 3.5–5.0)
Alkaline Phosphatase: 83 U/L (ref 38–126)
Anion gap: 12 (ref 5–15)
BUN: 10 mg/dL (ref 8–23)
CO2: 28 mmol/L (ref 22–32)
Calcium: 9.5 mg/dL (ref 8.9–10.3)
Chloride: 101 mmol/L (ref 98–111)
Creatinine, Ser: 0.65 mg/dL (ref 0.44–1.00)
GFR, Estimated: >60 mL/min (ref >60)
Glucose, Bld: 129 mg/dL — ABNORMAL HIGH (ref 70–99)
Potassium: 3.5 mmol/L (ref 3.5–5.1)
Sodium: 141 mmol/L (ref 135–145)
Total Bilirubin: 0.4 mg/dL (ref 0.0–1.2)
Total Protein: 7.6 g/dL (ref 6.5–8.1)

## 2023-09-24 LAB — URINALYSIS, MICROSCOPIC (REFLEX): RBC / HPF: >50 RBC/hpf (ref 0–5)

## 2023-09-24 LAB — TYPE AND SCREEN
ABO/RH(D): A POS
Antibody Screen: NEGATIVE

## 2023-09-24 NOTE — ED Provider Notes (Signed)
 [facility] AT [facility] Provider Note   CSN: [id number] Arrival date & time: 09/24/23  1715     Patient presents with: Hematuria   [name] is a 73 y.o. female.  {Add pertinent medical, surgical, social history, OB history to YEP:[id number]} Presents to the emergency department for evaluation of hematuria.  Patient reports that she has a history of recurrent bladder cancer, had resection on June 26.  She had been doing well postoperatively but started to have pressure with urinating recently, her urologist called in a prescription for antibiotics which she started 2 days ago.  She is now passing gross blood.       Prior to Admission medications   Medication Sig Start Date End Date Taking? Authorizing Provider  albuterol  (VENTOLIN  HFA) 108 (90 Base) MCG/ACT inhaler Inhale 1-2 puffs into the lungs every 6 (six) hours as needed for wheezing or shortness of breath. 07/12/23   [provider]  amLODipine  (NORVASC ) 5 MG tablet Take 5 mg by mouth in the morning. 06/10/23   [provider]  aspirin  EC 81 MG tablet Take 81 mg by mouth in the morning.    [provider]  azaTHIOprine  (IMURAN ) 50 MG tablet Take 1 tablet (50 mg total) by mouth daily. 06/07/23   Carlan, Chelsea L, NP  cetirizine (ZYRTEC) 10 MG tablet Take 10 mg by mouth in the morning.    [provider]  Cholecalciferol (VITAMIN D3) 50 MCG (2000 UT) TABS Take 2,000 Units by mouth in the morning.    [provider]  clopidogrel  (PLAVIX ) 75 MG tablet Take 75 mg by mouth daily. 09/09/23   [provider]  furosemide  (LASIX ) 40 MG tablet Take 40 mg by mouth daily as needed for edema.    [provider]  HYDROcodone -acetaminophen  (NORCO) 10-325 MG tablet Take 1 tablet by mouth every 6 (six) hours as needed for moderate pain (pain score 4-6). 09/14/23   Urbano Albright, MD  JARDIANCE 10 MG TABS tablet TAKE (1) TABLET BY MOUTH EACH MORNING.  09/13/23   Alvan Dorn FALCON, MD  levothyroxine  (SYNTHROID ) 50 MCG tablet Take 50 mcg by mouth in the morning.    [provider]  losartan  (COZAAR ) 25 MG tablet Take 25 mg by mouth in the morning. 06/10/23   [provider]  nitrofurantoin , macrocrystal-monohydrate, (MACROBID ) 100 MG capsule Take 1 capsule (100 mg total) by mouth every 12 (twelve) hours. 09/22/23   McKenzie, Belvie CROME, MD  nitroGLYCERIN  (NITROSTAT ) 0.4 MG SL tablet Place 0.4 mg under the tongue every 5 (five) minutes x 3 doses as needed for chest pain.    [provider]  ondansetron  (ZOFRAN -ODT) 4 MG disintegrating tablet Take 4 mg by mouth every 8 (eight) hours as needed for nausea or vomiting.    [provider]  pantoprazole  (PROTONIX ) 40 MG tablet Take 1 tablet (40 mg total) by mouth daily before breakfast. 06/07/23   Carlan, Chelsea L, NP  rosuvastatin  (CRESTOR ) 40 MG tablet Take 40 mg by mouth daily. 06/10/23   [provider]  SPIRIVA  RESPIMAT 2.5 MCG/ACT AERS Inhale 2 puffs into the lungs daily as needed (respiratory issues.). 06/22/23   [provider]  ursodiol  (URSO  FORTE) 500 MG tablet Take 1 tablet (500 mg total) by mouth 2 (two) times daily. 06/07/23   Carlan, Chelsea L, NP  Vibegron  (GEMTESA ) 75 MG TABS Take 1 tablet (75 mg total) by mouth daily. 05/27/23   Larocco, Sarah C, FNP  Allergies: Gabapentin , Prednisone , Carvedilol , Clonidine derivatives, Diltiazem , Tape, and Latex    Review of Systems  Updated Vital Signs BP (!) 162/74 (BP Location: Right Arm)   Pulse 73   Temp 97.8 F (36.6 C) (Oral)   Resp 18   Ht 5' 3 (1.6 m)   Wt 78 kg   SpO2 95%   BMI 30.47 kg/m   Physical Exam Vitals and nursing note reviewed.  Constitutional:      General: She is not in acute distress.    Appearance: She is well-developed.  HENT:     Head: Normocephalic and atraumatic.     Mouth/Throat:     Mouth: Mucous membranes are moist.  Eyes:     General: Vision grossly  intact. Gaze aligned appropriately.     Extraocular Movements: Extraocular movements intact.     Conjunctiva/sclera: Conjunctivae normal.  Cardiovascular:     Rate and Rhythm: Normal rate and regular rhythm.     Pulses: Normal pulses.     Heart sounds: Normal heart sounds, S1 normal and S2 normal. No murmur heard.    No friction rub. No gallop.  Pulmonary:     Effort: Pulmonary effort is normal. No respiratory distress.     Breath sounds: Normal breath sounds.  Abdominal:     General: Bowel sounds are normal.     Palpations: Abdomen is soft.     Tenderness: There is no abdominal tenderness. There is no guarding or rebound.     Hernia: No hernia is present.  Musculoskeletal:        General: No swelling.     Cervical back: Full passive range of motion without pain, normal range of motion and neck supple. No spinous process tenderness or muscular tenderness. Normal range of motion.     Right lower leg: No edema.     Left lower leg: No edema.  Skin:    General: Skin is warm and dry.     Capillary Refill: Capillary refill takes less than 2 seconds.     Findings: No ecchymosis, erythema, rash or wound.  Neurological:     General: No focal deficit present.     Mental Status: She is alert and oriented to person, place, and time.     GCS: GCS eye subscore is 4. GCS verbal subscore is 5. GCS motor subscore is 6.     Cranial Nerves: Cranial nerves 2-12 are intact.     Sensory: Sensation is intact.     Motor: Motor function is intact.     Coordination: Coordination is intact.  Psychiatric:        Attention and Perception: Attention normal.        Mood and Affect: Mood normal.        Speech: Speech normal.        Behavior: Behavior normal.     (all labs ordered are listed, but only abnormal results are displayed) Labs Reviewed  CBC WITH DIFFERENTIAL/PLATELET - Abnormal; Notable for the following components:      Result Value   Platelets 410 (*)    All other components within normal  limits  COMPREHENSIVE METABOLIC PANEL WITH GFR - Abnormal; Notable for the following components:   Glucose, Bld 129 (*)    All other components within normal limits  URINALYSIS, ROUTINE W REFLEX MICROSCOPIC - Abnormal; Notable for the following components:   Color, Urine RED (*)    APPearance HAZY (*)    Glucose, UA   (*)  Value: TEST NOT REPORTED DUE TO COLOR INTERFERENCE OF URINE PIGMENT   Hgb urine dipstick   (*)    Value: TEST NOT REPORTED DUE TO COLOR INTERFERENCE OF URINE PIGMENT   Bilirubin Urine   (*)    Value: TEST NOT REPORTED DUE TO COLOR INTERFERENCE OF URINE PIGMENT   Ketones, ur   (*)    Value: TEST NOT REPORTED DUE TO COLOR INTERFERENCE OF URINE PIGMENT   Nitrite   (*)    Value: TEST NOT REPORTED DUE TO COLOR INTERFERENCE OF URINE PIGMENT   Leukocytes,Ua   (*)    Value: TEST NOT REPORTED DUE TO COLOR INTERFERENCE OF URINE PIGMENT   All other components within normal limits  URINALYSIS, MICROSCOPIC (REFLEX) - Abnormal; Notable for the following components:   Bacteria, UA RARE (*)    All other components within normal limits  TYPE AND SCREEN    EKG: None  Radiology: CT Renal Stone Study Result Date: 09/24/2023 CLINICAL DATA:  TURBT 09/09/2023.  Hematuria. EXAM: CT ABDOMEN AND PELVIS WITHOUT CONTRAST TECHNIQUE: Multidetector CT imaging of the abdomen and pelvis was performed following the standard protocol without IV contrast. RADIATION DOSE REDUCTION: This exam was performed according to the departmental dose-optimization program which includes automated exposure control, adjustment of the mA and/or kV according to patient size and/or use of iterative reconstruction technique. COMPARISON:  02/13/2023 FINDINGS: Lower chest: The medial right base lung nodule has resolved. The vertical level of the 2 cm right lower lobe pulmonary nodule shown on the 02/13/2023 exam is not included on today's exam, and accordingly we were not able to determine whether this has resolved or if  it represents a lung cancer. Did the patient undergo the recommended chest CT? If not, chest CT at this time is recommended to exclude lung cancer. Coronary and descending thoracic aortic atherosclerosis. Hepatobiliary: Nodularity of the liver suggests cirrhosis. Gallbladder absent. Continued mild extrahepatic biliary dilatation. Pancreas: Unremarkable Spleen: Splenectomy with regenerative splenic tissue noted. Adrenals/Urinary Tract: No hydronephrosis or hydroureter. The urinary bladder is empty. Adrenal glands unremarkable. No urinary tract calculi. Stomach/Bowel: Mild sigmoid colon diverticulosis. 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 Vascular/Lymphatic: Atherosclerosis is present, including aortoiliac atherosclerotic disease. Numerous small and upper normal size retroperitoneal lymph nodes as before. Reproductive: Uterus absent. Other: No supplemental non-categorized findings. Musculoskeletal: Mild grade 1 degenerative anterolisthesis at L3-4. Mild degenerative hip arthropathy bilaterally. IMPRESSION: 1. The more caudad medial right base lung nodule shown on prior chest CT has resolved. However, vertical level of the 2 cm right lower lobe pulmonary nodule shown on the 02/13/2023 exam is not included on today's exam, and accordingly we were not able to determine whether this has resolved or if it represents a lung cancer. Did the patient undergo the recommended chest CT? If not, chest CT at this time is recommended to exclude lung cancer. 2. Nodularity of the liver suggests cirrhosis. 3. Mild sigmoid colon diverticulosis. 4. Mild grade 1 degenerative anterolisthesis at L3-4. 5. Mild degenerative hip arthropathy bilaterally. 6.  Aortic Atherosclerosis (ICD10-I70.0). Electronically Signed   By: Ryan Salvage M.D.   On: 09/24/2023 20:48    {Document cardiac monitor, telemetry assessment procedure when appropriate:32947} Procedures   Medications Ordered in the ED - No data to display    {Click  here for ABCD2, HEART and other calculators REFRESH Note before signing:1}  Medical Decision Making  ***  {Document critical care time when appropriate  Document review of labs and clinical decision tools ie [id number], etc  Document your independent review of radiology images and any outside records  Document your discussion with family members, caretakers and with consultants  Document social determinants of health affecting pt's care  Document your decision making why or why not admission, treatments were needed:32947:::1}   Final diagnoses:  None    ED Discharge Orders     None

## 2023-09-24 NOTE — ED Triage Notes (Signed)
 Patient had transuretheral bladder resection back in June.  Went to Urology on 7/9 with gross hematuria and possible urinary tract infection Reports she is just pouring blood when she urinates.  Patient complains of being weak.  Denies pain. Anbiotics were given on her last appt on 7/9

## 2023-09-25 NOTE — ED Notes (Signed)
 Pt/family received d/c paperwork at this time. After going over the paperwork any questions, comments, or concerns were answered to the best of this nurse's knowledge. The pt/family verbally acknowledged the teachings/instructions.

## 2023-09-25 NOTE — ED Notes (Signed)
 Bladder scan completed. Bladder scan resulted 0 and provider notified.

## 2023-09-27 ENCOUNTER — Telehealth: Payer: Self-pay

## 2023-09-27 ENCOUNTER — Telehealth: Payer: Self-pay | Admitting: Urology

## 2023-09-27 NOTE — Telephone Encounter (Signed)
 Patient left message she is feeling weak and urinating a lot of blood. She did go to the ER

## 2023-09-27 NOTE — Telephone Encounter (Signed)
 error

## 2023-09-27 NOTE — Telephone Encounter (Signed)
 Pt states she has since stopped bleeding, but had previously had blood clots that she passed, stated when she went to the ER they did a CT and urine sample but was too much blood to see anything pt also states the MD at the ER wanted her to continue her abx. Pt advised per verbal from McKenzie drink plenty of fluids and its possible for the incision from the her resection was bleeding pt voiced her understanding

## 2023-09-28 ENCOUNTER — Encounter: Attending: Physical Medicine & Rehabilitation | Admitting: Physical Medicine & Rehabilitation

## 2023-09-28 ENCOUNTER — Encounter: Payer: Self-pay | Admitting: Physical Medicine & Rehabilitation

## 2023-09-28 VITALS — BP 138/82 | HR 80 | Ht 63.0 in | Wt 182.0 lb

## 2023-09-28 DIAGNOSIS — F331 Major depressive disorder, recurrent, moderate: Secondary | ICD-10-CM | POA: Diagnosis not present

## 2023-09-28 DIAGNOSIS — M545 Low back pain, unspecified: Secondary | ICD-10-CM | POA: Insufficient documentation

## 2023-09-28 DIAGNOSIS — G8929 Other chronic pain: Secondary | ICD-10-CM | POA: Diagnosis present

## 2023-09-28 DIAGNOSIS — M2550 Pain in unspecified joint: Secondary | ICD-10-CM | POA: Diagnosis present

## 2023-09-28 DIAGNOSIS — G894 Chronic pain syndrome: Secondary | ICD-10-CM | POA: Diagnosis present

## 2023-09-28 MED ORDER — HYDROCODONE-ACETAMINOPHEN 10-325 MG PO TABS: 1.0000 | ORAL_TABLET | Freq: Four times a day (QID) | ORAL | 0 refills | Status: DC | PRN

## 2023-09-28 NOTE — Progress Notes (Signed)
 Subjective:    Patient ID: [name], female    DOB: 1950/12/14, 73 y.o.   MRN: [id number]  HPI    HPI 01/01/23   [name] is a 73 y.o. year old female  who  has a past medical history of Anxiety, Arthritis, Bradycardia, Breast cancer, left breast (HCC) (1970s; 2015), CAD (coronary artery disease), Cancer of skin of leg, 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HCC), Chronic lower back pain, Chronic pain, COPD (chronic obstructive pulmonary disease) (HCC), Coronary artery disease, Depression, GERD (gastroesophageal reflux disease), Heart murmur, Hepatic cirrhosis (HCC) (12/20/2017), Hepatitis, History of blood transfusion, History of hiatal hernia, History of kidney stones, Hyperlipidemia, Hypertension, Hypothyroidism, Kidney stone, Melanoma of forearm, left (HCC), Migraine, Obesity, Stroke (HCC) (04/2003), Tobacco use disorder, and Wears glasses.   They are presenting to PM&R clinic as a new patient for pain management evaluation. They were referred by Dr. Melvenia for treatment of chronic pain.  Patient reports she has had pain in multiple areas for many years.  Reports she had pain in her neck due to arthritis for many years.  Reports she had previous injections in her neck ESI?  X 3 about 10 years ago that did not significantly reduce her overall pain but did help the burning pain she was having in the area.  She said she was seeing a bone doctor and [name].  She reports she previously had cortisone injections in her shoulders more on the left side.  Shoulder pain is doing better recently.  She reports severe swelling and pain around her first [facility] joint.  She also has pain throughout other digits of her bilateral hands due to arthritis.  Additionally she has pain in her knees primarily on the left side.  This pain is worse with ambulation.  For several months she has been having pain around her left jaw.  Reports some shock type pain over her left face.  She has a lot of popping when she  opens and closes her jaw.  Recently had MRI of her face ordered by neurology, results pending.  She also had an MRI of her brain with results pending by neurology due to left-sided weakness rule out additional CVA.   He has a history of depression and anxiety.  Denies HI or SI.  Reports that she lost her son, dog and niece about 1 and half years ago.   Red flag symptoms: No red flags for back pain endorsed in Hx or ROS   Medications tried: Topical medications - denies  Nsaids - Excedrin  used in the past Tylenol   Helps for a short time  Opiates   Hydrocodone  10mg  for [employer], retired in July , now Dr. Melvenia Gabapentin - Allergy to this medication TCAs  nortriptyline  - helping jaw pain  SNRIs  Duloxetine?- not sure      Other treatments: PT-  PT for TMJ jaw, years ago it made the pain worse    TENs unit - denies  Injections- Shoulder injections Surgery - denies  Other  heating pad helps  Interval History 02/01/23  Patient is here for follow-up regarding her chronic neck pain, left-sided facial pain, polyarthralgia.  She had an MRI face completed in October which showed left TMJ degeneration.  Discontinued nortriptyline  because she did not feel like it was helping.  She continues to have left facial pain however it is doing better than it was during her past visit.  She continues to have a lot of pain in her neck and  base of her left thumb.  He also has pain in her left knee and left shoulder.  She continues to use hydrocodone  to manage her pain, she has used this for years.  She is not have any side effects with the medication.  She reports poor mood, did not tolerate sertraline  because she felt it made her mood worse.  She has discontinued this medication.  She reports that she has follow-up with psychiatry scheduled.  Interval history 04/01/2023 Patient is here for follow-up regarding her chronic neck, lower back, left-sided facial pain and polyarthralgia. Worst pain is in her  lower back and neck.   Since her last visit she has had treatment for bladder cancer. She was ordered oxycodone  after the procedure, however only used half a pill.  She has continued using her hydrocodone  10 mg which has been helping to manage her pain.  She does feel that the hydrocodone  she recently picked up from the pharmacy has a different pill shape than previously.  She feels like this version does not work as well as the prior version.  She denies any side effects to the medication.  She reports she is following up with psychiatry regarding her mood.  Denies SI or HI.  Interval history 05/31/2023 [name] is here regarding her chronic pain.  Chronic neck, lower back, facial pain and pain in multiple joints is largely unchanged from prior visit. She is also having some suprapubic pain related to bladder cancer.  She is currently being treated for her bladder cancer.  She had TURBT by Dr. 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  She is having treatments with BCG bladder instillation. Reports Norco 10 is helping keep her pain under control.  She had been ordered oxycodone  after procedure says she did not use this.  Interval history 07/29/23 Patient reports continued low back pain.  She is generally taking this medication about 3 times a day.  Pain is primarily axial.  She also has pain in her shoulders.  She has pain in her knees left greater than right.  Pain is controlled overall with current dose of Norco.  Denies any side effects with the medication.  She is not taking benzos, was weaned off this by PCP. She reports she completed treatment with chemo for her bladder cancer.  A1c has been up recently. Patient reports her depression/mood is doing better.   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    Interval history 09/28/23 Patient reports she had some additional treatments for bladder cancer.  Last week she had to go to the  hospital for hematuria, this has resolved since yesterday.  She continues to follow-up with urology.  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  Overall she feels like back pain is under control with Norco 10 mg 3-4 times a day.  Back pain is primarily axial worse with activity.  She is not having any side effects with this medication.  Pain medication helps her be active at home and allows her to continue to work at her job.  Pain Inventory Average Pain 10 Pain Right Now 7 My pain is constant and aching  In the last 24 hours, has pain interfered with the following? General activity 7 Relation with others 7 Enjoyment of life 7 What TIME of day is your pain at its worst? morning  and night Sleep (in general) Poor  Pain is worse with: walking, bending, sitting, inactivity, standing, unsure, and  some activites Pain improves with: rest and medication Relief from Meds: 10  Family History  Problem Relation Age of Onset   Heart failure Mother    Cancer Father    Heart failure Father    Cancer Sister    Dementia Sister    Heart disease Other    Arthritis Other    Cancer Other    Diabetes Other    Kidney disease Other    Cancer Sister    Heart failure Brother    Social History   Socioeconomic History   Marital status: Divorced    Spouse name: Not on file   Number of children: 2   Years of education: Not on file   Highest education level: Not on file  Occupational History   Occupation: Disabled   Occupation: retired  Tobacco Use   Smoking status: Some Days    Current packs/day: 0.50    Average packs/day: 0.5 packs/day for 40.0 years (20.0 ttl pk-yrs)    Types: Cigarettes    Passive exposure: Current   Smokeless tobacco: Never   Tobacco comments:    Trying to quit with use of Chantix and calm as of 04/13/2023 but restarted in February with 2 cigarettes per week.   Vaping Use   Vaping status: Never Used  Substance and Sexual Activity   Alcohol use: Not  Currently   Drug use: Never   Sexual activity: Not Currently  Other Topics Concern   Not on file  Social History Narrative   ** Merged History Encounter **       Right handed   Wears glasses    Drinks 1 cup of coffee daily   Drinks sweet tea twice a week.   Social Drivers of Corporate investment banker Strain: Low Risk  (12/15/2022)   Overall Financial Resource Strain (CARDIA)    Difficulty of Paying Living Expenses: Not hard at all  Food Insecurity: No Food Insecurity (12/15/2022)   Hunger Vital Sign    Worried About Running Out of Food in the Last Year: Never true    Ran Out of Food in the Last Year: Never true  Transportation Needs: No Transportation Needs (12/15/2022)   PRAPARE - Administrator, Civil Service (Medical): No    Lack of Transportation (Non-Medical): No  Physical Activity: Sufficiently Active (12/15/2022)   Exercise Vital Sign    Days of Exercise per Week: 7 days    Minutes of Exercise per Session: 30 min  Stress: Stress Concern Present (12/15/2022)   [employer] of Occupational Health - Occupational Stress Questionnaire    Feeling of Stress : To some extent  Social Connections: Moderately Isolated (12/15/2022)   Social Connection and Isolation Panel    Frequency of Communication with Friends and Family: More than three times a week    Frequency of Social Gatherings with Friends and Family: More than three times a week    Attends Religious Services: More than 4 times per year    Active Member of Clubs or Organizations: No    Attends Banker Meetings: Never    Marital Status: Divorced   Past Surgical History:  Procedure Laterality Date   ABDOMINAL HYSTERECTOMY  1978   APPENDECTOMY     BIOPSY  04/28/2022   Procedure: BIOPSY;  Surgeon: Eartha Angelia Sieving, MD;  [facility];  Service: Gastroenterology;;   BLADDER INSTILLATION N/A 04/29/2023   Procedure: BLADDER INSTILLATION- gemcitabine ;  Surgeon: Sherrilee Belvie CROME, MD;  [facility]: [facility];  Service: Urology;  Laterality: N/A;   BREAST BIOPSY Left 1970s; 2015   BREAST LUMPECTOMY Left 1970s   BREAST LUMPECTOMY WITH NEEDLE LOCALIZATION Left 07/07/2013   Procedure: BREAST LUMPECTOMY WITH NEEDLE LOCALIZATION;  Surgeon: Deward GORMAN Curvin DOUGLAS, MD;  Location: [facility];  Service: General;  Laterality: Left;   CARDIAC CATHETERIZATION N/A 01/22/2016   Procedure: Left Heart Cath and Coronary Angiography;  Surgeon: Peter M Swaziland, MD;  Location: [facility] INVASIVE CV LAB;  Service: Cardiovascular;  Laterality: N/A;   CARDIAC CATHETERIZATION N/A 01/22/2016   Procedure: Coronary Stent Intervention;  Surgeon: Peter M Swaziland, MD;  Location: [facility] INVAS[facility] CV LAB;  Service: Cardiovascular;  Laterality: N/A;   CAROTID STENT Left 04/2003   small left parietal and left hemispheric CVA/notes 07/29/2010   CHOLECYSTECTOMY OPEN     COLONOSCOPY  2011   COLONOSCOPY N/A 05/10/2019   Procedure: COLONOSCOPY;  Surgeon: Golda Claudis PENNER, MD;  [facility];  Service: Endoscopy;  Laterality: N/A;  1:45   COLONOSCOPY WITH PROPOFOL  N/A 09/01/2022   Procedure: COLONOSCOPY WITH PROPOFOL ;  Surgeon: Eartha Angelia Sieving, MD;  [facility];  Service: Gastroenterology;  Laterality: N/A;  1:45PM;ASA 3   CORONARY ATHERECTOMY N/A 12/29/2019   Procedure: CORONARY ATHERECTOMY;  Surgeon: Dann Candyce GORMAN, MD;  Location: [facility] INVASIVE CV LAB;  Service: Cardiovascular;  Laterality: N/A;  Prox RCA   CORONARY STENT INTERVENTION N/A 12/29/2019   Procedure: CORONARY STENT INTERVENTION;  Surgeon: Dann Candyce GORMAN, MD;  Location: [facility] INVASIVE CV LAB;  Service: Cardiovascular;  Laterality: N/A;  Prox RCA   CORONARY ULTRASOUND/IVUS N/A 12/29/2019   Procedure: Intravascular Ultrasound/IVUS;  Surgeon: Dann Candyce GORMAN, MD;  Location: [facility] INVASIVE CV LAB;  Service: Cardiovascular;  Laterality: N/A;   CYSTOSCOPY WITH BIOPSY N/A 04/29/2023   Procedure: CYSTOSCOPY WITH BIOPSY;   Surgeon: Sherrilee Belvie CROME, MD;  [facility]: [facility];  Service: Urology;  Laterality: N/A;   ESOPHAGEAL DILATION N/A 04/28/2022   Procedure: ESOPHAGEAL DILATION;  Surgeon: Eartha Angelia Sieving, MD;  [facility];  Service: Gastroenterology;  Laterality: N/A;   ESOPHAGOGASTRODUODENOSCOPY  08/06/2011   Procedure: ESOPHAGOGASTRODUODENOSCOPY (EGD);  Surgeon: Claudis PENNER Golda, MD;  [facility];  Service: Endoscopy;  Laterality: N/A;   ESOPHAGOGASTRODUODENOSCOPY N/A 05/10/2019   Procedure: ESOPHAGOGASTRODUODENOSCOPY (EGD);  Surgeon: Golda Claudis PENNER, MD;  [facility];  Service: Endoscopy;  Laterality: N/A;   ESOPHAGOGASTRODUODENOSCOPY (EGD) WITH PROPOFOL  N/A 04/28/2022   Procedure: ESOPHAGOGASTRODUODENOSCOPY (EGD) WITH PROPOFOL ;  Surgeon: Eartha Angelia Sieving, MD;  [facility];  Service: Gastroenterology;  Laterality: N/A;  10:30 am, pt can't move up due to transportation   FLEXIBLE SIGMOIDOSCOPY  08/06/2011   Procedure: FLEXIBLE SIGMOIDOSCOPY;  Surgeon: Claudis PENNER Golda, MD;  [facility];  Service: Endoscopy;  Laterality: N/A;   HEMOSTASIS CLIP PLACEMENT  09/01/2022   Procedure: HEMOSTASIS CLIP PLACEMENT;  Surgeon: Eartha Angelia Sieving, MD;  [facility];  Service: Gastroenterology;;   INGUINAL HERNIA REPAIR Left 1970s   IR ANGIO INTRA EXTRACRAN SEL COM CAROTID INNOMINATE BILAT MOD SED  11/01/2020   IR ANGIO VERTEBRAL SEL VERTEBRAL BILAT MOD SED  11/01/2020   IR RADIOLOGIST EVAL & MGMT  09/23/2020   IR US  GUIDE VASC ACCESS RIGHT  11/01/2020   LARYNX SURGERY  1970s   Polyps excised   LEFT HEART CATH AND CORONARY ANGIOGRAPHY N/A 12/29/2019   Procedure: LEFT HEART CATH AND CORONARY ANGIOGRAPHY;  Surgeon: Dann Candyce GORMAN,  MD;  Location: [facility] INVASIVE CV LAB;  Service: Cardiovascular;  Laterality: N/A;   LEFT HEART CATH AND CORONARY ANGIOGRAPHY N/A 10/12/2022   Procedure: LEFT HEART CATH AND CORONARY ANGIOGRAPHY;   Surgeon: Dann Candyce RAMAN, MD;  Location: [facility] INVASIVE CV LAB;  Service: Cardiovascular;  Laterality: N/A;   MALONEY DILATION  05/10/2019   Procedure: AGAPITO DILATION;  Surgeon: Golda Claudis PENNER, MD;  [facility];  Service: Endoscopy;;   MELANOMA EXCISION Left ~ 2016   forearm   POLYPECTOMY  05/10/2019   Procedure: POLYPECTOMY;  Surgeon: Golda Claudis PENNER, MD;  [facility];  Service: Endoscopy;;  colon   POLYPECTOMY  09/01/2022   Procedure: POLYPECTOMY INTESTINAL;  Surgeon: Eartha Angelia Sieving, MD;  [facility];  Service: Gastroenterology;;   SPLENECTOMY, 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   spontaneous rupture   SPLENECTOMY, TOTAL     SUBMUCOSAL LIFTING INJECTION  09/01/2022   Procedure: SUBMUCOSAL LIFTING INJECTION;  Surgeon: Eartha Angelia Sieving, MD;  [facility];  Service: Gastroenterology;;   SUBMUCOSAL TATTOO INJECTION  09/01/2022   Procedure: SUBMUCOSAL TATTOO INJECTION;  Surgeon: Eartha Angelia Sieving, MD;  [facility];  Service: Gastroenterology;;   TEMPORARY PACEMAKER N/A 12/29/2019   Procedure: TEMPORARY PACEMAKER;  Surgeon: Dann Candyce RAMAN, MD;  Location: [facility] INVASIVE CV LAB;  Service: Cardiovascular;  Laterality: N/A;   TRANSURETHRAL RESECTION OF BLADDER TUMOR N/A 03/15/2023   Procedure: TRANSURETHRAL RESECTION OF BLADDER TUMOR (TURBT)with Gemcitibane;  Surgeon: Sherrilee Belvie CROME, MD;  [facility];  Service: Urology;  Laterality: N/A;   TRANSURETHRAL RESECTION OF BLADDER TUMOR N/A 04/29/2023   Procedure: TRANSURETHRAL RESECTION OF BLADDER TUMOR (TURBT);  Surgeon: Sherrilee Belvie CROME, MD;  [facility];  Service: Urology;  Laterality: N/A;   TRANSURETHRAL RESECTION OF BLADDER TUMOR N/A 09/09/2023   Procedure: TURBT (TRANSURETHRAL RESECTION OF BLADDER TUMOR);  Surgeon: Sherrilee Belvie CROME, MD;  [facility];  Service: Urology;  Laterality: N/A;  with gemcitabine  instillation   Past Surgical History:   Procedure Laterality Date   ABDOMINAL HYSTERECTOMY  1978   APPENDECTOMY     BIOPSY  04/28/2022   Procedure: BIOPSY;  Surgeon: Eartha Angelia Sieving, MD;  [facility];  Service: Gastroenterology;;   BLADDER INSTILLATION N/A 04/29/2023   Procedure: BLADDER INSTILLATION- gemcitabine ;  Surgeon: Sherrilee Belvie CROME, MD;  [facility];  Service: Urology;  Laterality: N/A;   BREAST BIOPSY Left 1970s; 2015   BREAST LUMPECTOMY Left 1970s   BREAST LUMPECTOMY WITH NEEDLE LOCALIZATION Left 07/07/2013   Procedure: BREAST LUMPECTOMY WITH NEEDLE LOCALIZATION;  Surgeon: Deward RAMAN Curvin DOUGLAS, MD;  Location: [facility];  Service: General;  Laterality: Left;   CARDIAC CATHETERIZATION N/A 01/22/2016   Procedure: Left Heart Cath and Coronary Angiography;  Surgeon: Peter M Swaziland, MD;  Location: [facility] INVASIVE CV LAB;  Service: Cardiovascular;  Laterality: N/A;   CARDIAC CATHETERIZATION N/A 01/22/2016   Procedure: Coronary Stent Intervention;  Surgeon: Peter M Swaziland, MD;  Location: [facility] INVASIVE CV LAB;  Service: Cardiovascular;  Laterality: N/A;   CAROTID STENT Left 04/2003   small left parietal and left hemispheric CVA/notes 07/29/2010   CHOLECYSTECTOMY OPEN     COLONOSCOPY  2011   COLONOSCOPY N/A 05/10/2019   Procedure: COLONOSCOPY;  Surgeon: Golda Claudis PENNER, MD;  [facility];  Service: Endoscopy;  Laterality: N/A;  1:45   COLONOSCOPY WITH PROPOFOL  N/A 09/01/2022   Procedure: COLONOSCOPY WITH PROPOFOL ;  Surgeon: Eartha Angelia Sieving, MD;  [facility];  Service: Gastroenterology;  Laterality: N/A;  1:45PM;ASA 3   CORONARY ATHERECTOMY N/A 12/29/2019   Procedure: CORONARY ATHERECTOMY;  Surgeon: Dann Candyce RAMAN, MD;  Location: [facility] INVASIVE CV LAB;  Service: Cardiovascular;  Laterality: N/A;  Prox RCA   CORONARY STENT INTERVENTION N/A 12/29/2019   Procedure: CORONARY STENT INTERVENTION;  Surgeon: Dann Candyce RAMAN, MD;  Location: [facility] INVASIVE CV LAB;   Service: Cardiovascular;  Laterality: N/A;  Prox RCA   CORONARY ULTRASOUND/IVUS N/A 12/29/2019   Procedure: Intravascular Ultrasound/IVUS;  Surgeon: Dann Candyce RAMAN, MD;  Location: [facility] INVASIVE CV LAB;  Service: Cardiovascular;  Laterality: N/A;   CYSTOSCOPY WITH BIOPSY N/A 04/29/2023   Procedure: CYSTOSCOPY WITH BIOPSY;  Surgeon: Sherrilee Belvie CROME, MD;  [facility];  Service: Urology;  Laterality: N/A;   ESOPHAGEAL DILATION N/A 04/28/2022   Procedure: ESOPHAGEAL DILATION;  Surgeon: Eartha Angelia Sieving, MD;  [facility];  Service: Gastroenterology;  Laterality: N/A;   ESOPHAGOGASTRODUODENOSCOPY  08/06/2011   Procedure: ESOPHAGOGASTRODUODENOSCOPY (EGD);  Surgeon: Claudis RAYMOND Rivet, MD;  [facility];  Service: Endoscopy;  Laterality: N/A;   ESOPHAGOGASTRODUODENOSCOPY N/A 05/10/2019   Procedure: ESOPHAGOGASTRODUODENOSCOPY (EGD);  Surgeon: Rivet Claudis RAYMOND, MD;  [facility];  Service: Endoscopy;  Laterality: N/A;   ESOPHAGOGASTRODUODENOSCOPY (EGD) WITH PROPOFOL  N/A 04/28/2022   Procedure: ESOPHAGOGASTRODUODENOSCOPY (EGD) WITH PROPOFOL ;  Surgeon: Eartha Angelia Sieving, MD;  [facility];  Service: Gastroenterology;  Laterality: N/A;  10:30 am, pt can't move up due to transportation   FLEXIBLE SIGMOIDOSCOPY  08/06/2011   Procedure: FLEXIBLE SIGMOIDOSCOPY;  Surgeon: Claudis RAYMOND Rivet, MD;  [facility];  Service: Endoscopy;  Laterality: N/A;   HEMOSTASIS CLIP PLACEMENT  09/01/2022   Procedure: HEMOSTASIS CLIP PLACEMENT;  Surgeon: Eartha Angelia Sieving, MD;  [facility];  Service: Gastroenterology;;   INGUINAL HERNIA REPAIR Left 1970s   IR ANGIO INTRA EXTRACRAN SEL COM CAROTID INNOMINATE BILAT MOD SED  11/01/2020   IR ANGIO VERTEBRAL SEL VERTEBRAL BILAT MOD SED  11/01/2020   IR RADIOLOGIST EVAL & MGMT  09/23/2020   IR US  GUIDE VASC ACCESS RIGHT  11/01/2020   LARYNX SURGERY  1970s   Polyps excised   LEFT HEART  CATH AND CORONARY ANGIOGRAPHY N/A 12/29/2019   Procedure: LEFT HEART CATH AND CORONARY ANGIOGRAPHY;  Surgeon: Dann Candyce RAMAN, MD;  Location: [facility];  Service: Cardiovascular;  Laterality: N/A;   LEFT HEART CATH AND CORONARY ANGIOGRAPHY N/A 10/12/2022   Procedure: LEFT HEART CATH AND CORONARY ANGIOGRAPHY;  Surgeon: Dann Candyce RAMAN, MD;  Location: [facility] INVASIVE CV LAB;  Service: Cardiovascular;  Laterality: N/A;   MALONEY DILATION  05/10/2019   Procedure: AGAPITO DILATION;  Surgeon: Rivet Claudis RAYMOND, MD;  [facility];  Service: Endoscopy;;   MELANOMA EXCISION Left ~ 2016   forearm   POLYPECTOMY  05/10/2019   Procedure: POLYPECTOMY;  Surgeon: Rivet Claudis RAYMOND, MD;  [facility];  Service: Endoscopy;;  colon   POLYPECTOMY  09/01/2022   Procedure: POLYPECTOMY INTESTINAL;  Surgeon: Eartha Angelia Sieving, MD;  [facility];  Service: Gastroenterology;;   SPLENECTOMY, 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   spontaneous rupture   SPLENECTOMY, TOTAL     SUBMUCOSAL LIFTING INJECTION  09/01/2022   Procedure: SUBMUCOSAL LIFTING INJECTION;  Surgeon: Eartha Angelia Sieving, MD;  [facility];  Service: Gastroenterology;;   SUBMUCOSAL TATTOO INJECTION  09/01/2022   Procedure: SUBMUCOSAL TATTOO INJECTION;  Surgeon: Eartha Angelia Sieving,  MD;  [facility];  Service: Gastroenterology;;   TEMPORARY PACEMAKER N/A 12/29/2019   Procedure: TEMPORARY PACEMAKER;  Surgeon: Dann Candyce RAMAN, MD;  Location: [facility] INVASIVE CV LAB;  Service: Cardiovascular;  Laterality: N/A;   TRANSURETHRAL RESECTION OF BLADDER TUMOR N/A 03/15/2023   Procedure: TRANSURETHRAL RESECTION OF BLADDER TUMOR (TURBT)with Gemcitibane;  Surgeon: Sherrilee Belvie CROME, MD;  [facility];  Service: Urology;  Laterality: N/A;   TRANSURETHRAL RESECTION OF BLADDER TUMOR N/A 04/29/2023   Procedure: TRANSURETHRAL RESECTION OF BLADDER TUMOR (TURBT);  Surgeon: Sherrilee Belvie CROME, MD;   [facility];  Service: Urology;  Laterality: N/A;   TRANSURETHRAL RESECTION OF BLADDER TUMOR N/A 09/09/2023   Procedure: TURBT (TRANSURETHRAL RESECTION OF BLADDER TUMOR);  Surgeon: Sherrilee Belvie CROME, MD;  [facility];  Service: Urology;  Laterality: N/A;  with gemcitabine  instillation   Past Medical History:  Diagnosis Date   Anxiety    Arthritis    hands, arms, back, neck, knees, fingers (01/21/2016)   Bradycardia    Breast cancer, left breast (HCC) 1970s; 2015   CAD (coronary artery disease)    a. s/p NSTEMI in 2017 with DES to OM and unsuccessful PCI to RCA with wire dissection b. 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  Opioid Risk Score:   Fall Risk Score:  `1  Depression screen [facility] 2/9     07/29/2023   10:56 AM 05/31/2023   10:17 AM 04/01/2023   12:48 PM 02/01/2023    9:35 AM 01/21/2023    9:33 AM 01/01/2023   11:22 AM 12/15/2022    2:52 PM  Depression screen [facility] 2/9  Decreased Interest 0 0 0 3 1 0 1  Down, Depressed, Hopeless 0 0 0 3 3 2 1   PHQ - 2 Score 0 0 0 6 4  2 2   Altered sleeping     3 2 0  Tired, decreased energy     3 3 2   Change in appetite     1 0 2  Feeling bad or failure about yourself      0 1 0  Trouble concentrating     0 0 0  Moving slowly or fidgety/restless     0 0 0  Suicidal thoughts      0 0  PHQ-9 Score     11 8 6  Difficult doing work/chores     Somewhat difficult  Somewhat difficult     Review of Systems  Constitutional: Negative.   HENT: Negative.    Eyes: Negative.   Respiratory: Negative.    Cardiovascular: Negative.   Gastrointestinal: Negative.   Endocrine: Negative.   Genitourinary: Negative.   Musculoskeletal:  Positive for arthralgias and back pain.       Pain in both knees, both hands  Skin: Negative.   Allergic/Immunologic: Negative.   Neurological: Negative.   Hematological: Negative.   Psychiatric/Behavioral:  Positive for dysphoric mood.   All other systems reviewed and are negative.      Objective:   Physical Exam   Gen: no distress, normal appearing HEENT: oral mucosa pink and moist, NCAT Chest: normal effort, normal rate of breathing Abd: soft, non-distended Ext: no edema Psych: pleasant, normal affect Skin: intact Neuro: Alert and oriented x3, follows simple commands, fluent speech, comprehension intact Strength 5 out of 5 in right upper and right lower extremity Strength 4 out of 5 in left upper extremity and left lower extremity Tender to palpation in lumbar spine paraspinal muscles, mild tenderness C-spine paraspinal muscles Left first CMC TTP and pain with ROM Slump test negative bilaterally Bilateral knee with minimal TTP left greater than right  Prior exam Decreased/alerted sensation L face Facial movements intact b/l Strength 5/5 RUE and RLE Strength 4/5 LUE and LLE Musculoskeletal:  Arthritic changes in b/l hands noted L knee tenderness to palpation Mild B/L hand tenderness  L 1st CMC pain with palpation and PROM, mild swelling around this joint + L TMJ tenderness and  clicking  + TTP L spine and C spine paraspinal muscles + trapezius tenderness to palpation, Mild L shoulder tenderness Spurling's- neck pain resulted  Slump test negative    Xray R hand  2020 Right hand x-rays   Right wrist pain   No fracture dislocation or malalignment joint spaces seem preserved   Impression normal x-ray of the right hand   Xray L hand  2020  Left hand pain swelling numbness tingling   AP lateral oblique of the left hand no fracture dislocation or bony arthritis is seen.  Alignment looks normal.   Impression normal left hand x-ray   L knee Xray  12/25/21 On my read appears to show evidence  mild OA     C spine xary 2017 FINDINGS: Bones are osteopenic. Advanced spondylosis and degenerative disc disease at C5-6 with disc space loss, sclerosis and large osteophytes mild degenerative changes at other levels. Mild diffuse facet arthropathy. Facets appear aligned. Foraminal encroachment suspected of the left C5-6 foramen related to bony spurring. Trachea is midline. Odontoid is intact.   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   No acute osseous finding by plain radiography.       CT C spine 2016 IMPRESSION: 1. Cervical spine degenerative changes. 2. Straightening of the normal cervical lordosis. 3. 9 mm right lobe thyroid  nodule, too small to characterize, but most likely benign in the absence of known clinical risk factors for thyroid  carcinoma.   MRI Face and brain 12/23/22 Brain: No evidence of acute infarct, intracranial hemorrhage, mass, midline shift or extra-axial fluid collection. No pathologic enhancement. Mild for age chronic microvascular ischemic change.   Trigeminal nerves: No mass is identified along the course of the trigeminal nerves on thin-slice imaging. No evidence of neurovascular compression.   Vascular: Major arterial flow voids are maintained  at the skull base.   Sinuses/Orbits: Clear sinuses. No acute or significant orbital findings.   Soft tissues: Unremarkable.  No visible edema or mass lesion.   Osseous: Normal marrow signal without focal lesion. TMJs are located. Left TMJ degenerative change.   Other: None.   IMPRESSION: 1. No evidence of acute abnormality intracranially or in the face. 2. Left TMJ degenerative change.   '  02/01/23 Xray thumb left FINDINGS: Bones are osteopenic. Normal alignment without acute osseous finding or fracture. Moderate degenerative arthropathy of the left first CMC joint, first MCP joint, and the thumb interphalangeal joint. Soft tissues unremarkable.   IMPRESSION: Osteopenia and degenerative changes as above. No acute finding by plain radiography.   Assessment & Plan:   1) Chronic neck pain with prior imaging L spine spondylosis 2) Polyarthralgia with L knee pain, L shoulder pain, b/l hand pain.   -She is also having pain in her right knee more recently. 3) L sided facial pain likely due to TMJ pain.             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  #120 ordered -Patient discontinued nortriptyline  since her last visit she did not feel like this was helping -Continue UDS and pill counts.  Continue PDMP monitoring.  Pain contract completed prior visit. -Pill counts consistent  -Patient reports she is seeing psychiatry-has been weaning clorazepate , last prescription was 04/14/2023 by PDMP.  She reports nonbenzodiazepine medications are helping.  Patient reports her mood has overall been stable. -Consider Cymbalta -Discussed calling our clinic before any surgeries if any  different pain medications might be needed-patient has been careful to do this -Consider imaging if LBP worsens-will hold off for now as pain has been overall stable/controlled.  Has continues to have a lot of going on with recent bladder cancer treatment. -Pill count consistent overall.

## 2023-09-29 NOTE — Telephone Encounter (Signed)
 Wanting to know about kidney culture

## 2023-09-29 NOTE — Telephone Encounter (Signed)
 Return call to pt, Pt wants urine culture results from 09/22/23. Pt is aware labs sent to provider to review. Pt voiced understanding.

## 2023-09-30 ENCOUNTER — Telehealth: Payer: Self-pay

## 2023-09-30 NOTE — Telephone Encounter (Signed)
-----   Message from Belvie Clara sent at 09/29/2023  2:09 PM EDT ----- negative ----- Message ----- From: Gretta Carlos SAUNDERS, CMA Sent: 09/29/2023  12:45 PM EDT To: Belvie LITTIE Clara, MD  Pt want results urine culture. Please review and advised

## 2023-09-30 NOTE — Telephone Encounter (Signed)
 Pt is made aware through mychart.

## 2023-10-19 ENCOUNTER — Ambulatory Visit: Payer: Self-pay | Admitting: Urology

## 2023-10-27 ENCOUNTER — Telehealth: Payer: Self-pay

## 2023-10-27 NOTE — Telephone Encounter (Signed)
 Pt called to check on BCG back order pt asked if there is another treatment option for her since the Rx is currently on back order pt advised that a message will be sent to the provider but he is currently out of office until the 20th pt voiced her understanding

## 2023-11-02 NOTE — Telephone Encounter (Signed)
 Pt has been scheduled for BCG treatment

## 2023-11-03 ENCOUNTER — Other Ambulatory Visit: Payer: 59 | Admitting: Urology

## 2023-11-05 ENCOUNTER — Ambulatory Visit (INDEPENDENT_AMBULATORY_CARE_PROVIDER_SITE_OTHER): Admitting: Urology

## 2023-11-05 VITALS — BP 136/78 | HR 91

## 2023-11-05 DIAGNOSIS — C672 Malignant neoplasm of lateral wall of bladder: Secondary | ICD-10-CM

## 2023-11-05 LAB — URINALYSIS, ROUTINE W REFLEX MICROSCOPIC
Bilirubin, UA: NEGATIVE
Ketones, UA: NEGATIVE
Leukocytes,UA: NEGATIVE
Nitrite, UA: NEGATIVE
Protein,UA: NEGATIVE
RBC, UA: NEGATIVE
Specific Gravity, UA: 1.015 (ref 1.005–1.030)
Urobilinogen, Ur: 0.2 mg/dL (ref 0.2–1.0)
pH, UA: 5.5 (ref 5.0–7.5)

## 2023-11-05 MED ORDER — BCG LIVE 50 MG IS SUSR
3.2400 mL | Freq: Once | INTRAVESICAL | Status: AC
Start: 2023-11-05 — End: 2023-11-05
  Administered 2023-11-05: 81 mg via INTRAVESICAL

## 2023-11-05 NOTE — Progress Notes (Addendum)
 BCG Bladder Instillation  BCG # 1 of 6  Due to Bladder Cancer, patient is present today for BCG treatment. The patient was cleaned and prepped in a sterile fashion with Betadinex3 A 14 FR catheter was inserted, urine return was noted 10 ml, urine was Clear yellowin color.  50ml of reconstituted BCG was then instilled into the bladder. The catheter was then removed. Patient tolerated well, no complications were noted The patient given and discussed with BCG education attached to the AVS.   Performed by: Carlos, CMA  Follow up-1 week with Nurse  Additional comments- N/A

## 2023-11-09 ENCOUNTER — Encounter: Payer: Self-pay | Admitting: Urology

## 2023-11-09 NOTE — Progress Notes (Signed)
 11/05/2023 8:49 AM   [name] [birth year] [id number]  Referring provider: Sherrilee Belvie CROME, MD [address]  [address] [place],  [place] [address]  Followup bladder cancer   HPI: [name] is a 73yo here for followup for bladder cancer. She is currently undergoing BCG therapy. She denies any worsening LUTS. NO hematuria or dysuria   PMH: Past Medical History:  Diagnosis Date   Anxiety    Arthritis    hands, arms, back, neck, knees, fingers (01/21/2016)   Bradycardia    Breast cancer, left breast (HCC) 1970s; 2015   CAD (coronary artery disease)    a. s/p NSTEMI in 2017 with DES to OM and unsuccessful PCI to RCA with wire dissection b. 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   History of hiatal hernia    History of kidney stones    Hyperlipidemia    Hypertension    Hypothyroidism    Long term prescription benzodiazepine use 12/10/2022   Melanoma of forearm, left (HCC)    Migraine    a few migraines/year (01/21/2016)   Obesity    Polypharmacy 12/10/2022   Stroke (HCC) 04/2003   2 carotid artery stents in place; small left parietal and left hemispheric CVA.hazeline 01/21/2016   Tobacco use disorder    50 pack years; questionably discontinued in 2010   Wears glasses     Surgical History: Past Surgical History:  Procedure Laterality Date   ABDOMINAL HYSTERECTOMY  1978   APPENDECTOMY     BIOPSY  04/28/2022   Procedure: BIOPSY;  Surgeon: Eartha Angelia Sieving, MD;  [facility];  Service: Gastroenterology;;   BLADDER INSTILLATION N/A 04/29/2023   Procedure: BLADDER INSTILLATION-  gemcitabine ;  Surgeon: Sherrilee Belvie CROME, MD;  Location: [facility];  Service: Urology;  Laterality: N/A;   BREAST BIOPSY Left 1970s; 2015   BREAST LUMPECTOMY Left 1970s   BREAST LUMPECTOMY WITH NEEDLE LOCALIZATION Left 07/07/2013   Procedure: BREAST LUMPECTOMY WITH NEEDLE LOCALIZATION;  Surgeon: Deward GORMAN Curvin DOUGLAS, MD;  Location: [facility];  Service: General;  Laterality: Left;   CARDIAC CATHETERIZATION N/A 01/22/2016   Procedure: Left Heart Cath and Coronary Angiography;  Surgeon: Peter M Swaziland, MD;  Location: [facility] INVASIVE CV LAB;  Service: Cardiovascular;  Laterality: N/A;   CARDIAC CATHETERIZATION N/A 01/22/2016   Procedure: Coronary Stent Intervention;  Surgeon: Peter M Swaziland, MD;  Location: [facility] INVASIVE CV LAB;  Service: Cardiovascular;  Laterality: N/A;   CAROTID STENT Left 04/2003   small left parietal and left hemispheric CVA/notes 07/29/2010   CHOLECYSTECTOMY OPEN     COLONOSCOPY  2011   COLONOSCOPY N/A 05/10/2019   Procedure: COLONOSCOPY;  Surgeon: Golda Claudis PENNER, MD;  [facility];  Service: Endoscopy;  Laterality: N/A;  1:45   COLONOSCOPY WITH PROPOFOL  N/A 09/01/2022   Procedure: COLONOSCOPY WITH PROPOFOL ;  Surgeon: Eartha Angelia Sieving, MD;  [facility];  Service: Gastroenterology;  Laterality: N/A;  1:45PM;ASA 3   CORONARY ATHERECTOMY N/A 12/29/2019  Procedure: CORONARY ATHERECTOMY;  Surgeon: Dann Candyce RAMAN, MD;  Location: [facility] INVASIVE CV LAB;  Service: Cardiovascular;  Laterality: N/A;  Prox RCA   CORONARY STENT INTERVENTION N/A 12/29/2019   Procedure: CORONARY STENT INTERVENTION;  Surgeon: Dann Candyce RAMAN, MD;  Location: [facility] INVASIVE CV LAB;  Service: Cardiovascular;  Laterality: N/A;  Prox RCA   CORONARY ULTRASOUND/IVUS N/A 12/29/2019   Procedure: Intravascular Ultrasound/IVUS;  Surgeon: Dann Candyce RAMAN, MD;  Location: [facility] INVASIVE CV LAB;  Service: Cardiovascular;  Laterality: N/A;   CYSTOSCOPY WITH BIOPSY N/A 04/29/2023    Procedure: CYSTOSCOPY WITH BIOPSY;  Surgeon: Sherrilee Belvie CROME, MD;  Location: [facility];  Service: Urology;  Laterality: N/A;   ESOPHAGEAL DILATION N/A 04/28/2022   Procedure: ESOPHAGEAL DILATION;  Surgeon: Eartha Angelia Sieving, MD;  [facility];  Service: Gastroenterology;  Laterality: N/A;   ESOPHAGOGASTRODUODENOSCOPY  08/06/2011   Procedure: ESOPHAGOGASTRODUODENOSCOPY (EGD);  Surgeon: Claudis RAYMOND Rivet, MD;  [facility];  Service: Endoscopy;  Laterality: N/A;   ESOPHAGOGASTRODUODENOSCOPY N/A 05/10/2019   Procedure: ESOPHAGOGASTRODUODENOSCOPY (EGD);  Surgeon: Rivet Claudis RAYMOND, MD;  [facility];  Service: Endoscopy;  Laterality: N/A;   ESOPHAGOGASTRODUODENOSCOPY (EGD) WITH PROPOFOL  N/A 04/28/2022   Procedure: ESOPHAGOGASTRODUODENOSCOPY (EGD) WITH PROPOFOL ;  Surgeon: Eartha Angelia Sieving, MD;  [facility];  Service: Gastroenterology;  Laterality: N/A;  10:30 am, pt can't move up due to transportation   FLEXIBLE SIGMOIDOSCOPY  08/06/2011   Procedure: FLEXIBLE SIGMOIDOSCOPY;  Surgeon: Claudis RAYMOND Rivet, MD;  [facility];  Service: Endoscopy;  Laterality: N/A;   HEMOSTASIS CLIP PLACEMENT  09/01/2022   Procedure: HEMOSTASIS CLIP PLACEMENT;  Surgeon: Eartha Angelia Sieving, MD;  [facility];  Service: Gastroenterology;;   INGUINAL HERNIA REPAIR Left 1970s   IR ANGIO INTRA EXTRACRAN SEL COM CAROTID INNOMINATE BILAT MOD SED  11/01/2020   IR ANGIO VERTEBRAL SEL VERTEBRAL BILAT MOD SED  11/01/2020   IR RADIOLOGIST EVAL & MGMT  09/23/2020   IR US  GUIDE VASC ACCESS RIGHT  11/01/2020   LARYNX SURGERY  1970s   Polyps excised   LEFT HEART CATH AND CORONARY ANGIOGRAPHY N/A 12/29/2019   Procedure: LEFT HEART CATH AND CORONARY ANGIOGRAPHY;  Surgeon: Dann Candyce RAMAN, MD;  Location: [facility] INVASIVE CV LAB;  Service: Cardiovascular;  Laterality: N/A;   LEFT HEART CATH AND CORONARY ANGIOGRAPHY N/A 10/12/2022   Procedure: LEFT  HEART CATH AND CORONARY ANGIOGRAPHY;  Surgeon: Dann Candyce RAMAN, MD;  Location: [facility] INVASIVE CV LAB;  Service: Cardiovascular;  Laterality: N/A;   MALONEY DILATION  05/10/2019   Procedure: AGAPITO DILATION;  Surgeon: Rivet Claudis RAYMOND, MD;  [facility];  Service: Endoscopy;;   MELANOMA EXCISION Left ~ 2016   forearm   POLYPECTOMY  05/10/2019   Procedure: POLYPECTOMY;  Surgeon: Rivet Claudis RAYMOND, MD;  [facility];  Service: Endoscopy;;  colon   POLYPECTOMY  09/01/2022   Procedure: POLYPECTOMY INTESTINAL;  Surgeon: Eartha Angelia Sieving, MD;  [facility];  Service: Gastroenterology;;   SPLENECTOMY, 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   spontaneous rupture   SPLENECTOMY, TOTAL     SUBMUCOSAL LIFTING INJECTION  09/01/2022   Procedure: SUBMUCOSAL LIFTING INJECTION;  Surgeon: Eartha Angelia Sieving, MD;  [facility];  Service: Gastroenterology;;   SUBMUCOSAL TATTOO INJECTION  09/01/2022   Procedure: SUBMUCOSAL TATTOO INJECTION;  Surgeon: Eartha Angelia Sieving, MD;  [facility];  Service: Gastroenterology;;   TEMPORARY PACEMAKER N/A 12/29/2019   Procedure: TEMPORARY PACEMAKER;  Surgeon: Dann Candyce RAMAN, MD;  Location: [facility] INVASIVE CV LAB;  Service: Cardiovascular;  Laterality: N/A;   TRANSURETHRAL RESECTION OF BLADDER TUMOR N/A 03/15/2023   Procedure: TRANSURETHRAL RESECTION OF BLADDER TUMOR (TURBT)with Gemcitibane;  Surgeon: Sherrilee Belvie CROME, MD;  Location: [facility];  Service: Urology;  Laterality: N/A;   TRANSURETHRAL RESECTION OF BLADDER TUMOR N/A 04/29/2023   Procedure: TRANSURETHRAL RESECTION OF BLADDER TUMOR (TURBT);  Surgeon: Sherrilee Belvie CROME, MD;  Location: [facility];  Service: Urology;  Laterality: N/A;   TRANSURETHRAL RESECTION OF BLADDER TUMOR N/A 09/09/2023   Procedure: TURBT (TRANSURETHRAL RESECTION OF BLADDER TUMOR);  Surgeon: Sherrilee Belvie CROME, MD;  Location: [facility];  Service: Urology;  Laterality: N/A;  with gemcitabine   instillation    Home Medications:  Allergies as of 11/05/2023       Reactions   Gabapentin  Nausea And Vomiting   Other Reaction(s): Not available   Prednisone  Palpitations   Other Reaction(s): Not available   Carvedilol     Stopped due to bradycardia   Clonidine Derivatives    Stopped due to bradycardia   Diltiazem     Stopped due to bradycardia   Tape Other (See Comments)   Causes blisters to form   Latex Other (See Comments), Rash   Blisters Other Reaction(s): Not available        Medication List        Accurate as of November 05, 2023 11:59 PM. If you have any questions, ask your nurse or doctor.          albuterol  108 (90 Base) MCG/ACT inhaler Commonly known as: VENTOLIN  HFA Inhale 1-2 puffs into the lungs every 6 (six) hours as needed for wheezing or shortness of breath.   amLODipine  5 MG tablet Commonly known as: NORVASC  Take 5 mg by mouth in the morning.   aspirin  EC 81 MG tablet Take 81 mg by mouth in the morning.   azaTHIOprine  50 MG tablet Commonly known as: IMURAN  Take 1 tablet (50 mg total) by mouth daily.   cetirizine 10 MG tablet Commonly known as: ZYRTEC Take 10 mg by mouth in the morning.   clopidogrel  75 MG tablet Commonly known as: PLAVIX  Take 75 mg by mouth daily.   furosemide  40 MG tablet Commonly known as: LASIX  Take 40 mg by mouth daily as needed for edema.   Gemtesa  75 MG Tabs Generic drug: Vibegron  Take 1 tablet (75 mg total) by mouth daily.   HYDROcodone -acetaminophen  10-325 MG tablet Commonly known as: NORCO Take 1 tablet by mouth every 6 (six) hours as needed for moderate pain (pain score 4-6).   HYDROcodone -acetaminophen  10-325 MG tablet Commonly known as: NORCO Take 1 tablet by mouth every 6 (six) hours as needed.   Jardiance 10 MG Tabs tablet Generic drug: empagliflozin TAKE (1) TABLET BY MOUTH EACH MORNING.   levothyroxine  50 MCG tablet Commonly known as: SYNTHROID  Take 50 mcg by mouth in the morning.    losartan  25 MG tablet Commonly known as: COZAAR  Take 25 mg by mouth in the morning.   nitrofurantoin  (macrocrystal-monohydrate) 100 MG capsule Commonly known as: MACROBID  Take 1 capsule (100 mg total) by mouth every 12 (twelve) hours.   nitroGLYCERIN  0.4 MG SL tablet Commonly known as: NITROSTAT  Place 0.4 mg under the tongue every 5 (five) minutes x 3 doses as needed for chest pain.   ondansetron  4 MG disintegrating tablet Commonly known as: ZOFRAN -ODT Take 4 mg by mouth every 8 (eight) hours as needed for nausea or vomiting.   pantoprazole   40 MG tablet Commonly known as: PROTONIX  Take 1 tablet (40 mg total) by mouth daily before breakfast.   rosuvastatin  40 MG tablet Commonly known as: CRESTOR  Take 40 mg by mouth daily.   Spiriva  Respimat 2.5 MCG/ACT Aers Generic drug: Tiotropium Bromide  Monohydrate Inhale 2 puffs into the lungs daily as needed (respiratory issues.).   ursodiol  500 MG tablet Commonly known as: Urso  Forte Take 1 tablet (500 mg total) by mouth 2 (two) times daily.   Vitamin D3 50 MCG (2000 UT) Tabs Take 2,000 Units by mouth in the morning.        Allergies:  Allergies  Allergen Reactions   Gabapentin  Nausea And Vomiting    Other Reaction(s): Not available   Prednisone  Palpitations    Other Reaction(s): Not available   Carvedilol      Stopped due to bradycardia   Clonidine Derivatives     Stopped due to bradycardia    Diltiazem      Stopped due to bradycardia    Tape Other (See Comments)    Causes blisters to form   Latex Other (See Comments) and Rash    Blisters  Other Reaction(s): Not available    Family History: Family History  Problem Relation Age of Onset   Heart failure Mother    Cancer Father    Heart failure Father    Cancer Sister    Dementia Sister    Heart disease Other    Arthritis Other    Cancer Other    Diabetes Other    Kidney disease Other    Cancer Sister    Heart failure Brother     Social History:   reports that she has been smoking cigarettes. She has a 20 pack-year smoking history. She has been exposed to tobacco smoke. She has never used smokeless tobacco. She reports that she does not currently use alcohol. She reports that she does not use drugs.  ROS: All other review of systems were reviewed and are negative except what is noted above in HPI  Physical Exam: BP 136/78   Pulse 91   Constitutional:  Alert and oriented, No acute distress. HEENT: [place] AT, moist mucus membranes.  Trachea midline, no masses. Cardiovascular: No clubbing, cyanosis, or edema. Respiratory: Normal respiratory effort, no increased work of breathing. GI: Abdomen is soft, nontender, nondistended, no abdominal masses GU: No CVA tenderness.  Lymph: No cervical or inguinal lymphadenopathy. Skin: No rashes, bruises or suspicious lesions. Neurologic: Grossly intact, no focal deficits, moving all 4 extremities. Psychiatric: Normal mood and affect.  Laboratory Data: Lab Results  Component Value Date   WBC 8.2 09/24/2023   HGB 14.1 09/24/2023   HCT 42.7 09/24/2023   MCV 90.3 09/24/2023   PLT 410 (H) 09/24/2023    Lab Results  Component Value Date   CREATININE 0.65 09/24/2023    No results found for: PSA  No results found for: TESTOSTERONE  Lab Results  Component Value Date   HGBA1C 7.1 (H) 10/19/2022    Urinalysis    Component Value Date/Time   COLORURINE RED (A) 09/24/2023 1739   APPEARANCEUR Clear 11/05/2023 0900   LABSPEC  09/24/2023 1739    TEST NOT REPORTED DUE TO COLOR INTERFERENCE OF URINE PIGMENT   PHURINE  09/24/2023 1739    TEST NOT REPORTED DUE TO COLOR INTERFERENCE OF URINE PIGMENT   GLUCOSEU 3+ (A) 11/05/2023 0900   HGBUR (A) 09/24/2023 1739    TEST NOT REPORTED DUE TO COLOR INTERFERENCE OF URINE PIGMENT  BILIRUBINUR Negative 11/05/2023 0900   KETONESUR (A) 09/24/2023 1739    TEST NOT REPORTED DUE TO COLOR INTERFERENCE OF URINE PIGMENT   PROTEINUR Negative 11/05/2023  0900   PROTEINUR NEGATIVE 09/24/2023 1739   UROBILINOGEN 1.0 07/01/2023 1121   UROBILINOGEN 0.2 11/03/2014 1722   NITRITE Negative 11/05/2023 0900   NITRITE (A) 09/24/2023 1739    TEST NOT REPORTED DUE TO COLOR INTERFERENCE OF URINE PIGMENT   LEUKOCYTESUR Negative 11/05/2023 0900   LEUKOCYTESUR (A) 09/24/2023 1739    TEST NOT REPORTED DUE TO COLOR INTERFERENCE OF URINE PIGMENT    Lab Results  Component Value Date   LABMICR Comment 11/05/2023   WBCUA 0-5 09/22/2023   LABEPIT 0-10 09/22/2023   BACTERIA RARE (A) 09/24/2023    Pertinent Imaging:  No results found for this or any previous visit.  Results for orders placed during the hospital encounter of 10/05/07  US  VenoUS Imaging Bilateral  Narrative Clinical Data: Left leg pain and swelling  VENOUS DUPLEX ULTRASOUND OF BILATERAL LOWER EXTREMITIES  Technique: Gray-scale sonography with graded compression, as well as color Doppler and duplex ultrasound, were performed to evaluate the deep venous system of both lower extremities from the level of the common femoral vein through the popliteal and proximal calf veins. Spectral Doppler was utilized to evaulate flow at rest and with distal augmentation maneuvers. Confirmed with physician's office that bilateral lower extremity exam requested.  Comparison: None  Findings: Deep venous system patent and compressible from the groin through popliteal fossa bilaterally.SABRA Spontaneous venous flow present with intact augmentation and evidence of respiratory phasicity. No intraluminal thrombus identified. Visualized portions of the greater saphenous systems unremarkable.  IMPRESSION: No evidence of deep venous thrombosis.  Provider: Montie Axe  No results found for this or any previous visit.  No results found for this or any previous visit.  Results for orders placed during the hospital encounter of 02/02/23  US  Renal  Narrative CLINICAL DATA:  Painless  hematuria  EXAM: RENAL / URINARY TRACT ULTRASOUND COMPLETE  COMPARISON:  CT renal stone 01/31/2023  FINDINGS: Right Kidney:  Renal measurements: 12.7 x 4.4 x 5.7 cm = volume: 161 mL. Echogenicity within normal limits. No mass or hydronephrosis visualized.  Left Kidney:  Renal measurements: 11.5 x 6.4 x 5.3 cm = volume: 205 mL. Echogenicity within normal limits. No mass or hydronephrosis visualized.  Bladder:  Under distended and not well evaluated.  Other:  None.  IMPRESSION: Normal renal ultrasound.   Electronically Signed By: Greig Pique M.D. On: 02/02/2023 18:00  No results found for this or any previous visit.  Results for orders placed in visit on 02/03/23  CT HEMATURIA WORKUP  Narrative CLINICAL DATA:  Gross hematuria.  EXAM: CT ABDOMEN AND PELVIS WITHOUT AND WITH CONTRAST  TECHNIQUE: Multidetector CT imaging of the abdomen and pelvis was performed following the standard protocol before and following the bolus administration of intravenous contrast.  RADIATION DOSE REDUCTION: This exam was performed according to the departmental dose-optimization program which includes automated exposure control, adjustment of the mA and/or kV according to patient size and/or use of iterative reconstruction technique.  CONTRAST:  OMNIPAQUE  IOHEXOL  300 MG/ML  SOLN  COMPARISON:  CT renal stone protocol from 01/31/2023.  FINDINGS: Lower chest: There is a spiculated marginated 1.6 x 2.1 cm nodule in the right lung lower lobe, which was present but partially imaged on the recent prior exam. There is also a new pleural-based 8 x 14 mm nodule in the right lung lower  lobe. Findings are concerning for neoplastic process. Further evaluation with dedicated contrast-enhanced chest CT scan is recommended. The lung bases are otherwise clear. No pleural effusion or consolidation. The heart is normal in size. No pericardial effusion.  Hepatobiliary: The liver is  normal in size. Non-cirrhotic configuration. No suspicious mass. No intrahepatic bile duct dilation. There is moderate dilation of the extrahepatic bile duct, most likely due to post cholecystectomy status. Gallbladder is surgically absent.  Pancreas: Unremarkable. No pancreatic ductal dilatation or surrounding inflammatory changes.  Spleen: Surgically absent. There is an accessory splenule in the left upper quadrant. There are also multiple smaller hyperattenuating nodules along the left diaphragmatic undersurface, also favored to represent splenules.  Adrenals/Urinary Tract: Adrenal glands are unremarkable.  Non-contrast images: No radiopaque urinary tract calculi.  Kidneys: Symmetric enhancement. No suspicious renal mass.  Urinary Tract Opacification: Adequate.  Collecting Systems and Ureters: No filling defects, masses, strictures, or areas of abnormal dilatation.  Urinary Bladder: Is decompressed precluding optimal assessment. However, note is made of mild bladder wall mucosal hyperenhancement and subtle perivesical fat stranding, concerning for cystitis. Correlate clinically and with urinalysis. No focal mass or bladder calculi.  Stomach/Bowel: No disproportionate dilation of the small or large bowel loops. No evidence of abnormal bowel wall thickening or inflammatory changes. The appendix was not visualized; however there is no acute inflammatory process in the right lower quadrant.  Vascular/Lymphatic: No ascites or pneumoperitoneum. No abdominal or pelvic lymphadenopathy, by size criteria. No aneurysmal dilation of the major abdominal arteries. There are moderate peripheral atherosclerotic vascular calcifications of the aorta and its major branches.  Reproductive: The uterus is surgically absent. No large adnexal mass.  Other: There is a tiny fat containing umbilical hernia. The soft tissues and abdominal wall are otherwise unremarkable.  Musculoskeletal: No  suspicious osseous lesions. There are mild multilevel degenerative changes in the visualized spine.  IMPRESSION: *No nephroureterolithiasis or obstructive uropathy. No suspicious renal, ureteric or urinary bladder mass. Mild urinary bladder wall mucosal hyperenhancement and subtle perivesical fat stranding, concerning for cystitis. Correlate clinically and with urinalysis. *There is a spiculated marginated 1.6 x 2.1 cm nodule in the right lung lower lobe and an additional pleural-based 8 x 14 mm nodule in the right lung lower lobe, which is new since the recent prior exam. Findings are concerning for neoplastic process. Further evaluation with dedicated contrast-enhanced chest CT scan is recommended, unless recently performed. *Multiple other nonacute observations, as described above.  Aortic Atherosclerosis (ICD10-I70.0).   Electronically Signed By: Ree Molt M.D. On: 02/23/2023 14:38  Results for orders placed during the hospital encounter of 09/24/23  CT Renal Stone Study  Narrative CLINICAL DATA:  TURBT 09/09/2023.  Hematuria.  EXAM: CT ABDOMEN AND PELVIS WITHOUT CONTRAST  TECHNIQUE: Multidetector CT imaging of the abdomen and pelvis was performed following the standard protocol without IV contrast.  RADIATION DOSE REDUCTION: This exam was performed according to the departmental dose-optimization program which includes automated exposure control, adjustment of the mA and/or kV according to patient size and/or use of iterative reconstruction technique.  COMPARISON:  02/13/2023  FINDINGS: Lower chest: The medial right base lung nodule has resolved. The vertical level of the 2 cm right lower lobe pulmonary nodule shown on the 02/13/2023 exam is not included on today's exam, and accordingly we were not able to determine whether this has resolved or if it represents a lung cancer. Did the patient undergo the recommended chest CT? If not, chest CT at this time is  recommended to exclude  lung cancer.  Coronary and descending thoracic aortic atherosclerosis.  Hepatobiliary: Nodularity of the liver suggests cirrhosis. Gallbladder absent. Continued mild extrahepatic biliary dilatation.  Pancreas: Unremarkable  Spleen: Splenectomy with regenerative splenic tissue noted.  Adrenals/Urinary Tract: No hydronephrosis or hydroureter. The urinary bladder is empty. Adrenal glands unremarkable. No urinary tract calculi.  Stomach/Bowel: Mild sigmoid colon diverticulosis. 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  Vascular/Lymphatic: Atherosclerosis is present, including aortoiliac atherosclerotic disease. Numerous small and upper normal size retroperitoneal lymph nodes as before.  Reproductive: Uterus absent.  Other: No supplemental non-categorized findings.  Musculoskeletal: Mild grade 1 degenerative anterolisthesis at L3-4. Mild degenerative hip arthropathy bilaterally.  IMPRESSION: 1. The more caudad medial right base lung nodule shown on prior chest CT has resolved. However, vertical level of the 2 cm right lower lobe pulmonary nodule shown on the 02/13/2023 exam is not included on today's exam, and accordingly we were not able to determine whether this has resolved or if it represents a lung cancer. Did the patient undergo the recommended chest CT? If not, chest CT at this time is recommended to exclude lung cancer. 2. Nodularity of the liver suggests cirrhosis. 3. Mild sigmoid colon diverticulosis. 4. Mild grade 1 degenerative anterolisthesis at L3-4. 5. Mild degenerative hip arthropathy bilaterally. 6.  Aortic Atherosclerosis (ICD10-I70.0).   Electronically Signed By: Ryan Salvage M.D. On: 09/24/2023 20:48   Assessment & Plan:    1. Malignant neoplasm of lateral wall of urinary bladder (HCC) (Primary) Continue BCG therapy - Urinalysis, Routine w reflex microscopic - bcg vaccine injection 81 mg - In and Out Cath   No  follow-ups on file.  Belvie Clara, MD  [facility] 

## 2023-11-09 NOTE — Patient Instructions (Signed)

## 2023-11-12 ENCOUNTER — Encounter: Payer: Self-pay | Admitting: Urology

## 2023-11-12 ENCOUNTER — Ambulatory Visit: Admitting: Urology

## 2023-11-12 VITALS — BP 154/75 | HR 90

## 2023-11-12 DIAGNOSIS — C672 Malignant neoplasm of lateral wall of bladder: Secondary | ICD-10-CM

## 2023-11-12 LAB — URINALYSIS, ROUTINE W REFLEX MICROSCOPIC
Bilirubin, UA: NEGATIVE
Ketones, UA: NEGATIVE
Leukocytes,UA: NEGATIVE
Nitrite, UA: NEGATIVE
Protein,UA: NEGATIVE
Specific Gravity, UA: 1.02 (ref 1.005–1.030)
Urobilinogen, Ur: 0.2 mg/dL (ref 0.2–1.0)
pH, UA: 6 (ref 5.0–7.5)

## 2023-11-12 MED ORDER — BCG LIVE 50 MG IS SUSR
3.2400 mL | Freq: Once | INTRAVESICAL | Status: AC
  Administered 2023-11-12: 81 mg via INTRAVESICAL

## 2023-11-12 NOTE — Progress Notes (Signed)
 11/12/2023 9:22 AM   [name] 08/14/1950 [id number]  Referring provider: Shona Norleen PEDLAR, MD [address] [name],  [place] [address]  Followup bladder cancer   HPI: [name] is a 73yo here for followup for bladder cancer. She is tolerating BCG well. She is getting intermittent right lower quadrant. No exacerbating events for the pain. Pain lasts a couple of seconds. NO hematuria    PMH: Past Medical History:  Diagnosis Date   Anxiety    Arthritis    hands, arms, back, neck, knees, fingers (01/21/2016)   Bradycardia    Breast cancer, left breast (HCC) 1970s; 2015   CAD (coronary artery disease)    a. s/p NSTEMI in 2017 with DES to OM and unsuccessful PCI to RCA with wire dissection b. 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   History of hiatal hernia    History of kidney stones    Hyperlipidemia    Hypertension    Hypothyroidism    Long term prescription benzodiazepine use 12/10/2022   Melanoma of forearm, left (HCC)    Migraine    a few migraines/year (01/21/2016)   Obesity    Polypharmacy 12/10/2022   Stroke (HCC) 04/2003   2 carotid artery stents in place; small left parietal and left hemispheric CVA.hazeline 01/21/2016   Tobacco use disorder    50 pack years; questionably discontinued in 2010   Wears glasses     Surgical History: Past Surgical History:  Procedure Laterality Date   ABDOMINAL HYSTERECTOMY  1978   APPENDECTOMY     BIOPSY  04/28/2022   Procedure: BIOPSY;  Surgeon: Eartha Angelia Sieving, MD;  [facility];  Service: Gastroenterology;;   BLADDER INSTILLATION N/A  04/29/2023   Procedure: BLADDER INSTILLATION- gemcitabine ;  Surgeon: Sherrilee Belvie CROME, MD;  Location: [facility];  Service: Urology;  Laterality: N/A;   BREAST BIOPSY Left 1970s; 2015   BREAST LUMPECTOMY Left 1970s   BREAST LUMPECTOMY WITH NEEDLE LOCALIZATION Left 07/07/2013   Procedure: BREAST LUMPECTOMY WITH NEEDLE LOCALIZATION;  Surgeon: Deward GORMAN Curvin DOUGLAS, MD;  Location:  SURGERY CENTER;  Service: General;  Laterality: Left;   CARDIAC CATHETERIZATION N/A 01/22/2016   Procedure: Left Heart Cath and Coronary Angiography;  Surgeon: Peter M Swaziland, MD;  Location: [facility] INVASIVE CV LAB;  Service: Cardiovascular;  Laterality: N/A;   CARDIAC CATHETERIZATION N/A 01/22/2016   Procedure: Coronary Stent Intervention;  Surgeon: Peter M Swaziland, MD;  Location: [facility] INVASIVE CV LAB;  Service: Cardiovascular;  Laterality: N/A;   CAROTID STENT Left 04/2003   small left parietal and left hemispheric CVA/notes 07/29/2010   CHOLECYSTECTOMY OPEN     COLONOSCOPY  2011   COLONOSCOPY N/A 05/10/2019   Procedure: COLONOSCOPY;  Surgeon: Golda Claudis PENNER, MD;  [facility];  Service: Endoscopy;  Laterality: N/A;  1:45   COLONOSCOPY WITH PROPOFOL  N/A 09/01/2022   Procedure: COLONOSCOPY WITH PROPOFOL ;  Surgeon: Eartha Angelia Sieving, MD;  [facility];  Service: Gastroenterology;  Laterality: N/A;  1:45PM;ASA 3   CORONARY ATHERECTOMY N/A 12/29/2019   Procedure: CORONARY ATHERECTOMY;  Surgeon: Dann Candyce RAMAN, MD;  Location: [facility] INVASIVE CV LAB;  Service: Cardiovascular;  Laterality: N/A;  Prox RCA   CORONARY STENT INTERVENTION N/A 12/29/2019   Procedure: CORONARY STENT INTERVENTION;  Surgeon: Dann Candyce RAMAN, MD;  Location: [facility] INVASIVE CV LAB;  Service: Cardiovascular;  Laterality: N/A;  Prox RCA   CORONARY ULTRASOUND/IVUS N/A 12/29/2019   Procedure: Intravascular Ultrasound/IVUS;  Surgeon: Dann Candyce RAMAN, MD;  Location: [facility] INVASIVE CV LAB;  Service: Cardiovascular;  Laterality:  N/A;   CYSTOSCOPY WITH BIOPSY N/A 04/29/2023   Procedure: CYSTOSCOPY WITH BIOPSY;  Surgeon: Sherrilee Belvie CROME, MD;  Location: [facility];  Service: Urology;  Laterality: N/A;   ESOPHAGEAL DILATION N/A 04/28/2022   Procedure: ESOPHAGEAL DILATION;  Surgeon: Eartha Angelia Sieving, MD;  [facility];  Service: Gastroenterology;  Laterality: N/A;   ESOPHAGOGASTRODUODENOSCOPY  08/06/2011   Procedure: ESOPHAGOGASTRODUODENOSCOPY (EGD);  Surgeon: Claudis RAYMOND Rivet, MD;  [facility];  Service: Endoscopy;  Laterality: N/A;   ESOPHAGOGASTRODUODENOSCOPY N/A 05/10/2019   Procedure: ESOPHAGOGASTRODUODENOSCOPY (EGD);  Surgeon: Rivet Claudis RAYMOND, MD;  [facility];  Service: Endoscopy;  Laterality: N/A;   ESOPHAGOGASTRODUODENOSCOPY (EGD) WITH PROPOFOL  N/A 04/28/2022   Procedure: ESOPHAGOGASTRODUODENOSCOPY (EGD) WITH PROPOFOL ;  Surgeon: Eartha Angelia Sieving, MD;  [facility];  Service: Gastroenterology;  Laterality: N/A;  10:30 am, pt can't move up due to transportation   FLEXIBLE SIGMOIDOSCOPY  08/06/2011   Procedure: FLEXIBLE SIGMOIDOSCOPY;  Surgeon: Claudis RAYMOND Rivet, MD;  [facility];  Service: Endoscopy;  Laterality: N/A;   HEMOSTASIS CLIP PLACEMENT  09/01/2022   Procedure: HEMOSTASIS CLIP PLACEMENT;  Surgeon: Eartha Angelia Sieving, MD;  [facility];  Service: Gastroenterology;;   INGUINAL HERNIA REPAIR Left 1970s   IR ANGIO INTRA EXTRACRAN SEL COM CAROTID INNOMINATE BILAT MOD SED  11/01/2020   IR ANGIO VERTEBRAL SEL VERTEBRAL BILAT MOD SED  11/01/2020   IR RADIOLOGIST EVAL & MGMT  09/23/2020   IR US  GUIDE VASC ACCESS RIGHT  11/01/2020   LARYNX SURGERY  1970s   Polyps excised   LEFT HEART CATH AND CORONARY ANGIOGRAPHY N/A 12/29/2019   Procedure: LEFT HEART CATH AND CORONARY ANGIOGRAPHY;  Surgeon: Dann Candyce RAMAN, MD;  Location: [facility] INVASIVE CV LAB;  Service: Cardiovascular;  Laterality: N/A;   LEFT HEART CATH AND CORONARY  ANGIOGRAPHY N/A 10/12/2022   Procedure: LEFT HEART CATH AND CORONARY ANGIOGRAPHY;  Surgeon: Dann Candyce RAMAN, MD;  Location: [facility] INVASIVE CV LAB;  Service: Cardiovascular;  Laterality: N/A;   MALONEY DILATION  05/10/2019   Procedure: AGAPITO DILATION;  Surgeon: Rivet Claudis RAYMOND, MD;  [facility];  Service: Endoscopy;;   MELANOMA EXCISION Left ~ 2016   forearm   POLYPECTOMY  05/10/2019   Procedure: POLYPECTOMY;  Surgeon: Rivet Claudis RAYMOND, MD;  [facility];  Service: Endoscopy;;  colon   POLYPECTOMY  09/01/2022   Procedure: POLYPECTOMY INTESTINAL;  Surgeon: Eartha Angelia Sieving, MD;  [facility];  Service: Gastroenterology;;   SPLENECTOMY, 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   spontaneous rupture   SPLENECTOMY, TOTAL     SUBMUCOSAL LIFTING INJECTION  09/01/2022   Procedure: SUBMUCOSAL LIFTING INJECTION;  Surgeon: Eartha Angelia Sieving, MD;  [facility];  Service: Gastroenterology;;   SUBMUCOSAL TATTOO INJECTION  09/01/2022   Procedure: SUBMUCOSAL TATTOO INJECTION;  Surgeon: Eartha Angelia Sieving, MD;  [facility];  Service:  Gastroenterology;;   TEMPORARY PACEMAKER N/A 12/29/2019   Procedure: TEMPORARY PACEMAKER;  Surgeon: Dann Candyce RAMAN, MD;  Location: [facility] INVASIVE CV LAB;  Service: Cardiovascular;  Laterality: N/A;   TRANSURETHRAL RESECTION OF BLADDER TUMOR N/A 03/15/2023   Procedure: TRANSURETHRAL RESECTION OF BLADDER TUMOR (TURBT)with Gemcitibane;  Surgeon: Sherrilee Belvie CROME, MD;  Location: [facility];  Service: Urology;  Laterality: N/A;   TRANSURETHRAL RESECTION OF BLADDER TUMOR N/A 04/29/2023   Procedure: TRANSURETHRAL RESECTION OF BLADDER TUMOR (TURBT);  Surgeon: Sherrilee Belvie CROME, MD;  Location: [facility];  Service: Urology;  Laterality: N/A;   TRANSURETHRAL RESECTION OF BLADDER TUMOR N/A 09/09/2023   Procedure: TURBT (TRANSURETHRAL RESECTION OF BLADDER TUMOR);  Surgeon: Sherrilee Belvie CROME, MD;  Location: [facility];  Service: Urology;   Laterality: N/A;  with gemcitabine  instillation    Home Medications:  Allergies as of 11/12/2023       Reactions   Gabapentin  Nausea And Vomiting   Other Reaction(s): Not available   Prednisone  Palpitations   Other Reaction(s): Not available   Carvedilol     Stopped due to bradycardia   Clonidine Derivatives    Stopped due to bradycardia   Diltiazem     Stopped due to bradycardia   Tape Other (See Comments)   Causes blisters to form   Latex Other (See Comments), Rash   Blisters Other Reaction(s): Not available        Medication List        Accurate as of November 12, 2023  9:22 AM. If you have any questions, ask your nurse or doctor.          albuterol  108 (90 Base) MCG/ACT inhaler Commonly known as: VENTOLIN  HFA Inhale 1-2 puffs into the lungs every 6 (six) hours as needed for wheezing or shortness of breath.   amLODipine  5 MG tablet Commonly known as: NORVASC  Take 5 mg by mouth in the morning.   aspirin  EC 81 MG tablet Take 81 mg by mouth in the morning.   azaTHIOprine  50 MG tablet Commonly known as: IMURAN  Take 1 tablet (50 mg total) by mouth daily.   cetirizine 10 MG tablet Commonly known as: ZYRTEC Take 10 mg by mouth in the morning.   clopidogrel  75 MG tablet Commonly known as: PLAVIX  Take 75 mg by mouth daily.   furosemide  40 MG tablet Commonly known as: LASIX  Take 40 mg by mouth daily as needed for edema.   Gemtesa  75 MG Tabs Generic drug: Vibegron  Take 1 tablet (75 mg total) by mouth daily.   HYDROcodone -acetaminophen  10-325 MG tablet Commonly known as: NORCO Take 1 tablet by mouth every 6 (six) hours as needed for moderate pain (pain score 4-6).   HYDROcodone -acetaminophen  10-325 MG tablet Commonly known as: NORCO Take 1 tablet by mouth every 6 (six) hours as needed.   Jardiance 10 MG Tabs tablet Generic drug: empagliflozin TAKE (1) TABLET BY MOUTH EACH MORNING.   levothyroxine  50 MCG tablet Commonly known as: SYNTHROID  Take 50 mcg  by mouth in the morning.   losartan  25 MG tablet Commonly known as: COZAAR  Take 25 mg by mouth in the morning.   nitrofurantoin  (macrocrystal-monohydrate) 100 MG capsule Commonly known as: MACROBID  Take 1 capsule (100 mg total) by mouth every 12 (twelve) hours.   nitroGLYCERIN  0.4 MG SL tablet Commonly known as: NITROSTAT  Place 0.4 mg under the tongue every 5 (five) minutes x 3 doses as needed for chest pain.   ondansetron  4 MG disintegrating tablet Commonly known as: ZOFRAN -ODT Take 4 mg by  mouth every 8 (eight) hours as needed for nausea or vomiting.   pantoprazole  40 MG tablet Commonly known as: PROTONIX  Take 1 tablet (40 mg total) by mouth daily before breakfast.   rosuvastatin  40 MG tablet Commonly known as: CRESTOR  Take 40 mg by mouth daily.   Spiriva  Respimat 2.5 MCG/ACT Aers Generic drug: Tiotropium Bromide  Monohydrate Inhale 2 puffs into the lungs daily as needed (respiratory issues.).   ursodiol  500 MG tablet Commonly known as: Urso  Forte Take 1 tablet (500 mg total) by mouth 2 (two) times daily.   Vitamin D3 50 MCG (2000 UT) Tabs Take 2,000 Units by mouth in the morning.        Allergies:  Allergies  Allergen Reactions   Gabapentin  Nausea And Vomiting    Other Reaction(s): Not available   Prednisone  Palpitations    Other Reaction(s): Not available   Carvedilol      Stopped due to bradycardia   Clonidine Derivatives     Stopped due to bradycardia    Diltiazem      Stopped due to bradycardia    Tape Other (See Comments)    Causes blisters to form   Latex Other (See Comments) and Rash    Blisters  Other Reaction(s): Not available    Family History: Family History  Problem Relation Age of Onset   Heart failure Mother    Cancer Father    Heart failure Father    Cancer Sister    Dementia Sister    Heart disease Other    Arthritis Other    Cancer Other    Diabetes Other    Kidney disease Other    Cancer Sister    Heart failure Brother      Social History:  reports that she has been smoking cigarettes. She has a 20 pack-year smoking history. She has been exposed to tobacco smoke. She has never used smokeless tobacco. She reports that she does not currently use alcohol. She reports that she does not use drugs.  ROS: All other review of systems were reviewed and are negative except what is noted above in HPI  Physical Exam: BP (!) 154/75   Pulse 90   Constitutional:  Alert and oriented, No acute distress. HEENT: [place] AT, moist mucus membranes.  Trachea midline, no masses. Cardiovascular: No clubbing, cyanosis, or edema. Respiratory: Normal respiratory effort, no increased work of breathing. GI: Abdomen is soft, nontender, nondistended, no abdominal masses GU: No CVA tenderness.  Lymph: No cervical or inguinal lymphadenopathy. Skin: No rashes, bruises or suspicious lesions. Neurologic: Grossly intact, no focal deficits, moving all 4 extremities. Psychiatric: Normal mood and affect.  Laboratory Data: Lab Results  Component Value Date   WBC 8.2 09/24/2023   HGB 14.1 09/24/2023   HCT 42.7 09/24/2023   MCV 90.3 09/24/2023   PLT 410 (H) 09/24/2023    Lab Results  Component Value Date   CREATININE 0.65 09/24/2023    No results found for: PSA  No results found for: TESTOSTERONE  Lab Results  Component Value Date   HGBA1C 7.1 (H) 10/19/2022    Urinalysis    Component Value Date/Time   COLORURINE RED (A) 09/24/2023 1739   APPEARANCEUR Clear 11/05/2023 0900   LABSPEC  09/24/2023 1739    TEST NOT REPORTED DUE TO COLOR INTERFERENCE OF URINE PIGMENT   PHURINE  09/24/2023 1739    TEST NOT REPORTED DUE TO COLOR INTERFERENCE OF URINE PIGMENT   GLUCOSEU 3+ (A) 11/05/2023 0900   HGBUR (A) 09/24/2023 1739  TEST NOT REPORTED DUE TO COLOR INTERFERENCE OF URINE PIGMENT   BILIRUBINUR Negative 11/05/2023 0900   KETONESUR (A) 09/24/2023 1739    TEST NOT REPORTED DUE TO COLOR INTERFERENCE OF URINE PIGMENT    PROTEINUR Negative 11/05/2023 0900   PROTEINUR NEGATIVE 09/24/2023 1739   UROBILINOGEN 1.0 07/01/2023 1121   UROBILINOGEN 0.2 11/03/2014 1722   NITRITE Negative 11/05/2023 0900   NITRITE (A) 09/24/2023 1739    TEST NOT REPORTED DUE TO COLOR INTERFERENCE OF URINE PIGMENT   LEUKOCYTESUR Negative 11/05/2023 0900   LEUKOCYTESUR (A) 09/24/2023 1739    TEST NOT REPORTED DUE TO COLOR INTERFERENCE OF URINE PIGMENT    Lab Results  Component Value Date   LABMICR Comment 11/05/2023   WBCUA 0-5 09/22/2023   LABEPIT 0-10 09/22/2023   BACTERIA RARE (A) 09/24/2023    Pertinent Imaging:  No results found for this or any previous visit.  Results for orders placed during the hospital encounter of 10/05/07  US  VenoUS Imaging Bilateral  Narrative Clinical Data: Left leg pain and swelling  VENOUS DUPLEX ULTRASOUND OF BILATERAL LOWER EXTREMITIES  Technique: Gray-scale sonography with graded compression, as well as color Doppler and duplex ultrasound, were performed to evaluate the deep venous system of both lower extremities from the level of the common femoral vein through the popliteal and proximal calf veins. Spectral Doppler was utilized to evaulate flow at rest and with distal augmentation maneuvers. Confirmed with physician's office that bilateral lower extremity exam requested.  Comparison: None  Findings: Deep venous system patent and compressible from the groin through popliteal fossa bilaterally.SABRA Spontaneous venous flow present with intact augmentation and evidence of respiratory phasicity. No intraluminal thrombus identified. Visualized portions of the greater saphenous systems unremarkable.  IMPRESSION: No evidence of deep venous thrombosis.  Provider: Montie Axe  No results found for this or any previous visit.  No results found for this or any previous visit.  Results for orders placed during the hospital encounter of 02/02/23  US   Renal  Narrative CLINICAL DATA:  Painless hematuria  EXAM: RENAL / URINARY TRACT ULTRASOUND COMPLETE  COMPARISON:  CT renal stone 01/31/2023  FINDINGS: Right Kidney:  Renal measurements: 12.7 x 4.4 x 5.7 cm = volume: 161 mL. Echogenicity within normal limits. No mass or hydronephrosis visualized.  Left Kidney:  Renal measurements: 11.5 x 6.4 x 5.3 cm = volume: 205 mL. Echogenicity within normal limits. No mass or hydronephrosis visualized.  Bladder:  Under distended and not well evaluated.  Other:  None.  IMPRESSION: Normal renal ultrasound.   Electronically Signed By: Greig Pique M.D. On: 02/02/2023 18:00  No results found for this or any previous visit.  Results for orders placed in visit on 02/03/23  CT HEMATURIA WORKUP  Narrative CLINICAL DATA:  Gross hematuria.  EXAM: CT ABDOMEN AND PELVIS WITHOUT AND WITH CONTRAST  TECHNIQUE: Multidetector CT imaging of the abdomen and pelvis was performed following the standard protocol before and following the bolus administration of intravenous contrast.  RADIATION DOSE REDUCTION: This exam was performed according to the departmental dose-optimization program which includes automated exposure control, adjustment of the mA and/or kV according to patient size and/or use of iterative reconstruction technique.  CONTRAST:  OMNIPAQUE  IOHEXOL  300 MG/ML  SOLN  COMPARISON:  CT renal stone protocol from 01/31/2023.  FINDINGS: Lower chest: There is a spiculated marginated 1.6 x 2.1 cm nodule in the right lung lower lobe, which was present but partially imaged on the recent prior exam. There is also a  new pleural-based 8 x 14 mm nodule in the right lung lower lobe. Findings are concerning for neoplastic process. Further evaluation with dedicated contrast-enhanced chest CT scan is recommended. The lung bases are otherwise clear. No pleural effusion or consolidation. The heart is normal in size. No  pericardial effusion.  Hepatobiliary: The liver is normal in size. Non-cirrhotic configuration. No suspicious mass. No intrahepatic bile duct dilation. There is moderate dilation of the extrahepatic bile duct, most likely due to post cholecystectomy status. Gallbladder is surgically absent.  Pancreas: Unremarkable. No pancreatic ductal dilatation or surrounding inflammatory changes.  Spleen: Surgically absent. There is an accessory splenule in the left upper quadrant. There are also multiple smaller hyperattenuating nodules along the left diaphragmatic undersurface, also favored to represent splenules.  Adrenals/Urinary Tract: Adrenal glands are unremarkable.  Non-contrast images: No radiopaque urinary tract calculi.  Kidneys: Symmetric enhancement. No suspicious renal mass.  Urinary Tract Opacification: Adequate.  Collecting Systems and Ureters: No filling defects, masses, strictures, or areas of abnormal dilatation.  Urinary Bladder: Is decompressed precluding optimal assessment. However, note is made of mild bladder wall mucosal hyperenhancement and subtle perivesical fat stranding, concerning for cystitis. Correlate clinically and with urinalysis. No focal mass or bladder calculi.  Stomach/Bowel: No disproportionate dilation of the small or large bowel loops. No evidence of abnormal bowel wall thickening or inflammatory changes. The appendix was not visualized; however there is no acute inflammatory process in the right lower quadrant.  Vascular/Lymphatic: No ascites or pneumoperitoneum. No abdominal or pelvic lymphadenopathy, by size criteria. No aneurysmal dilation of the major abdominal arteries. There are moderate peripheral atherosclerotic vascular calcifications of the aorta and its major branches.  Reproductive: The uterus is surgically absent. No large adnexal mass.  Other: There is a tiny fat containing umbilical hernia. The soft tissues and abdominal wall  are otherwise unremarkable.  Musculoskeletal: No suspicious osseous lesions. There are mild multilevel degenerative changes in the visualized spine.  IMPRESSION: *No nephroureterolithiasis or obstructive uropathy. No suspicious renal, ureteric or urinary bladder mass. Mild urinary bladder wall mucosal hyperenhancement and subtle perivesical fat stranding, concerning for cystitis. Correlate clinically and with urinalysis. *There is a spiculated marginated 1.6 x 2.1 cm nodule in the right lung lower lobe and an additional pleural-based 8 x 14 mm nodule in the right lung lower lobe, which is new since the recent prior exam. Findings are concerning for neoplastic process. Further evaluation with dedicated contrast-enhanced chest CT scan is recommended, unless recently performed. *Multiple other nonacute observations, as described above.  Aortic Atherosclerosis (ICD10-I70.0).   Electronically Signed By: Ree Molt M.D. On: 02/23/2023 14:38  Results for orders placed during the hospital encounter of 09/24/23  CT Renal Stone Study  Narrative CLINICAL DATA:  TURBT 09/09/2023.  Hematuria.  EXAM: CT ABDOMEN AND PELVIS WITHOUT CONTRAST  TECHNIQUE: Multidetector CT imaging of the abdomen and pelvis was performed following the standard protocol without IV contrast.  RADIATION DOSE REDUCTION: This exam was performed according to the departmental dose-optimization program which includes automated exposure control, adjustment of the mA and/or kV according to patient size and/or use of iterative reconstruction technique.  COMPARISON:  02/13/2023  FINDINGS: Lower chest: The medial right base lung nodule has resolved. The vertical level of the 2 cm right lower lobe pulmonary nodule shown on the 02/13/2023 exam is not included on today's exam, and accordingly we were not able to determine whether this has resolved or if it represents a lung cancer. Did the patient undergo  the recommended chest  CT? If not, chest CT at this time is recommended to exclude lung cancer.  Coronary and descending thoracic aortic atherosclerosis.  Hepatobiliary: Nodularity of the liver suggests cirrhosis. Gallbladder absent. Continued mild extrahepatic biliary dilatation.  Pancreas: Unremarkable  Spleen: Splenectomy with regenerative splenic tissue noted.  Adrenals/Urinary Tract: No hydronephrosis or hydroureter. The urinary bladder is empty. Adrenal glands unremarkable. No urinary tract calculi.  Stomach/Bowel: Mild sigmoid colon diverticulosis. 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  Vascular/Lymphatic: Atherosclerosis is present, including aortoiliac atherosclerotic disease. Numerous small and upper normal size retroperitoneal lymph nodes as before.  Reproductive: Uterus absent.  Other: No supplemental non-categorized findings.  Musculoskeletal: Mild grade 1 degenerative anterolisthesis at L3-4. Mild degenerative hip arthropathy bilaterally.  IMPRESSION: 1. The more caudad medial right base lung nodule shown on prior chest CT has resolved. However, vertical level of the 2 cm right lower lobe pulmonary nodule shown on the 02/13/2023 exam is not included on today's exam, and accordingly we were not able to determine whether this has resolved or if it represents a lung cancer. Did the patient undergo the recommended chest CT? If not, chest CT at this time is recommended to exclude lung cancer. 2. Nodularity of the liver suggests cirrhosis. 3. Mild sigmoid colon diverticulosis. 4. Mild grade 1 degenerative anterolisthesis at L3-4. 5. Mild degenerative hip arthropathy bilaterally. 6.  Aortic Atherosclerosis (ICD10-I70.0).   Electronically Signed By: Ryan Salvage M.D. On: 09/24/2023 20:48   Assessment & Plan:    1. Malignant neoplasm of lateral wall of urinary bladder (HCC) (Primary) Continue BCG therapy.  - Urinalysis, Routine w reflex  microscopic - In and Out Cath - bcg vaccine injection 81 mg   No follow-ups on file.  Belvie Clara, MD  [facility] [place]

## 2023-11-12 NOTE — Progress Notes (Signed)
 BCG Bladder Instillation  BCG # 2 of 6  Due to Bladder Cancer, patient is present today for BCG treatment. The patient was cleaned and prepped in a sterile fashion with Betadinex3 A 14 FR catheter was inserted, urine return was noted 25 ml, urine was Clear yellowin color.  50ml of reconstituted BCG was then instilled into the bladder. The catheter was then removed. Patient tolerated well, no complications were noted The patient given and discussed with BCG education attached to the AVS.   Performed by: Carlos, CMA  Follow up-1 week with urology  Additional comments- N/A

## 2023-11-12 NOTE — Patient Instructions (Signed)

## 2023-11-17 ENCOUNTER — Encounter (INDEPENDENT_AMBULATORY_CARE_PROVIDER_SITE_OTHER): Payer: Self-pay | Admitting: Gastroenterology

## 2023-11-17 ENCOUNTER — Ambulatory Visit: Admitting: Urology

## 2023-11-17 VITALS — BP 133/85 | HR 81

## 2023-11-17 DIAGNOSIS — C672 Malignant neoplasm of lateral wall of bladder: Secondary | ICD-10-CM | POA: Diagnosis not present

## 2023-11-17 LAB — URINALYSIS, ROUTINE W REFLEX MICROSCOPIC
Bilirubin, UA: NEGATIVE
Ketones, UA: NEGATIVE
Leukocytes,UA: NEGATIVE
Nitrite, UA: NEGATIVE
Protein,UA: NEGATIVE
Specific Gravity, UA: 1.015 (ref 1.005–1.030)
Urobilinogen, Ur: 0.2 mg/dL (ref 0.2–1.0)
pH, UA: 6 (ref 5.0–7.5)

## 2023-11-17 MED ORDER — BCG LIVE 50 MG IS SUSR
3.2400 mL | Freq: Once | INTRAVESICAL | Status: AC
Start: 2023-11-17 — End: 2023-11-17
  Administered 2023-11-17: 81 mg via INTRAVESICAL

## 2023-11-17 NOTE — Progress Notes (Signed)
 BCG Bladder Instillation  BCG # 3 of 6  Due to Bladder Cancer, patient is present today for BCG treatment. The patient was cleaned and prepped in a sterile fashion with Betadinex3 A 14 FR catheter was inserted, urine return was noted 100 ml, urine was Clear yellowin color.  50ml of reconstituted BCG was then instilled into the bladder. The catheter was then removed. Patient tolerated well, no complications were noted The patient given and discussed with BCG education attached to the AVS.   Performed by: Carlos, CMA  Follow up-1week with Urology BCG  Additional comments- Keep F/U appt BCG

## 2023-11-18 ENCOUNTER — Ambulatory Visit: Attending: Cardiology | Admitting: Cardiology

## 2023-11-18 ENCOUNTER — Encounter: Payer: Self-pay | Admitting: Cardiology

## 2023-11-18 VITALS — BP 134/78 | HR 92 | Ht 63.0 in | Wt 182.0 lb

## 2023-11-18 DIAGNOSIS — R079 Chest pain, unspecified: Secondary | ICD-10-CM

## 2023-11-18 DIAGNOSIS — I10 Essential (primary) hypertension: Secondary | ICD-10-CM

## 2023-11-18 DIAGNOSIS — E782 Mixed hyperlipidemia: Secondary | ICD-10-CM | POA: Diagnosis not present

## 2023-11-18 DIAGNOSIS — I5032 Chronic diastolic (congestive) heart failure: Secondary | ICD-10-CM

## 2023-11-18 DIAGNOSIS — I25118 Atherosclerotic heart disease of native coronary artery with other forms of angina pectoris: Secondary | ICD-10-CM | POA: Diagnosis not present

## 2023-11-18 MED ORDER — EZETIMIBE 10 MG PO TABS: 10.0000 mg | ORAL_TABLET | Freq: Every day | ORAL | 3 refills | Status: AC

## 2023-11-18 MED ORDER — LOSARTAN POTASSIUM 50 MG PO TABS: 50.0000 mg | ORAL_TABLET | Freq: Every day | ORAL | 3 refills | Status: DC

## 2023-11-18 MED ORDER — AMLODIPINE BESYLATE 10 MG PO TABS: 10.0000 mg | ORAL_TABLET | Freq: Every day | ORAL | 3 refills | Status: DC

## 2023-11-18 NOTE — Progress Notes (Signed)
 Clinical Summary [name] is a 73 y.o.female seen today for follow up of the following medical problems.    1. CAD with stable angina - admit 01/2016 with NSTEMI. Full cath report below. Received DES to OM. Unsuccesful PCI to RCA with wire induced dissection, managed medically. The OM was thought to be the culprit. If recurring symptoms can consider repeat RCA intervention. .   12/2019 cath DES to RCA. RCA was prone to vasospasm during procedure  08/2020 nuclear stress: breast attenuatin vs mild anterior ischemia, low risk  10/02/22 seen in clinic with accelerating chest pains - 09/2022 cath: LAD 20%, ramus 50%, D1 50%, patent RCA and OM stent, normal LVEDP   - 3 weeks left arm pain around elbow, sharp pain. 10/10 in severity. With some SOB, can get sweaty at times.  - reports can improve after NG. Pain is similar to her prior angina per her report.  - compliant with meds     2. COPD - 04/2018 admission with COPD exacerbation       3. Chronic diastolic HF - some recent edema, R>L. Prior pattern, she had US  2020 right leg no DVT - fluid improved with diuretic. Taking lasix  prn, takes about 3 times a week.   - some increase in leg swelling. Has lasix  40mg  prn, takes about 2 times per week and that help. Reports pcp recently increased her norvasc  to 10mg  daily, had some prior swelling on this dose in the past     4. HTN - had some bilateral LE edema 12/2019, norvasc  was lowered to 5mg  daily.  -compliant with meds - pcp increased her norvasc  to 10mg .    - home bp's 160s/80s   4. Bradycardia - admit 08/2016 with bradycardia - resolved off coreg    5. History of CVA - 2005 at [facility] - she reports history of stents at the time . Followed by Dr Dolphus, 10/2020 monitoring cerebral angiogram.      6. Hyperlipedemia 10/2022 TC 132 TG 104 HDL 42 LDL 71 - 09/2023 TC 855 TG 888 HDL 44 LDL 80   7. Cirrhosis - followed by GI - she reports history of autoimune hepatitis,       8. Bladder cancer - followed by urology - recent issues with recurrent cancer, now on chemo   SH: Son is on dialysis and has had other health issues, has been very stressful. Son passed Jul 23 2021 Past Medical History:  Diagnosis Date   Anxiety    Arthritis    hands, arms, back, neck, knees, fingers (01/21/2016)   Bradycardia    Breast cancer, left breast (HCC) 1970s; 2015   CAD (coronary artery disease)    a. s/p NSTEMI in 2017 with DES to OM and unsuccessful PCI to RCA with wire dissection b. 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   History of hiatal hernia    History of kidney stones    Hyperlipidemia    Hypertension    Hypothyroidism  Long term prescription benzodiazepine use 12/10/2022   Melanoma of forearm, left (HCC)    Migraine    a few migraines/year (01/21/2016)   Obesity    Polypharmacy 12/10/2022   Stroke (HCC) 04/2003   2 carotid artery stents in place; small left parietal and left hemispheric CVA.hazeline 01/21/2016   Tobacco use disorder    50 pack years; questionably discontinued in 2010   Wears glasses      Allergies  Allergen Reactions   Gabapentin  Nausea And Vomiting    Other Reaction(s): Not available   Prednisone  Palpitations    Other Reaction(s): Not available   Carvedilol      Stopped due to bradycardia   Clonidine Derivatives     Stopped due to bradycardia    Diltiazem      Stopped due to bradycardia    Tape Other (See Comments)    Causes blisters to form   Latex Other (See Comments) and Rash    Blisters  Other Reaction(s): Not available     Current Outpatient  Medications  Medication Sig Dispense Refill   albuterol  (VENTOLIN  HFA) 108 (90 Base) MCG/ACT inhaler Inhale 1-2 puffs into the lungs every 6 (six) hours as needed for wheezing or shortness of breath.     amLODipine  (NORVASC ) 10 MG tablet Take 1 tablet (10 mg total) by mouth daily. 90 tablet 3   aspirin  EC 81 MG tablet Take 81 mg by mouth in the morning.     azaTHIOprine  (IMURAN ) 50 MG tablet Take 1 tablet (50 mg total) by mouth daily. 90 tablet 3   busPIRone (BUSPAR) 5 MG tablet Take 5 mg by mouth 2 (two) times daily.     cetirizine (ZYRTEC) 10 MG tablet Take 10 mg by mouth in the morning.     Cholecalciferol (VITAMIN D3) 50 MCG (2000 UT) TABS Take 2,000 Units by mouth in the morning.     clopidogrel  (PLAVIX ) 75 MG tablet Take 75 mg by mouth daily.     furosemide  (LASIX ) 40 MG tablet Take 40 mg by mouth daily as needed for edema.     HYDROcodone -acetaminophen  (NORCO) 10-325 MG tablet Take 1 tablet by mouth every 6 (six) hours as needed for moderate pain (pain score 4-6). 120 tablet 0   HYDROcodone -acetaminophen  (NORCO) 10-325 MG tablet Take 1 tablet by mouth every 6 (six) hours as needed. 120 tablet 0   JARDIANCE 10 MG TABS tablet TAKE (1) TABLET BY MOUTH EACH MORNING. 90 tablet 3   levothyroxine  (SYNTHROID ) 50 MCG tablet Take 50 mcg by mouth in the morning.     losartan  (COZAAR ) 50 MG tablet Take 1 tablet (50 mg total) by mouth daily. 90 tablet 3   nitrofurantoin , macrocrystal-monohydrate, (MACROBID ) 100 MG capsule Take 1 capsule (100 mg total) by mouth every 12 (twelve) hours. 14 capsule 0   nitroGLYCERIN  (NITROSTAT ) 0.4 MG SL tablet Place 0.4 mg under the tongue every 5 (five) minutes x 3 doses as needed for chest pain.     ondansetron  (ZOFRAN -ODT) 4 MG disintegrating tablet Take 4 mg by mouth every 8 (eight) hours as needed for nausea or vomiting.     pantoprazole  (PROTONIX ) 40 MG tablet Take 1 tablet (40 mg total) by mouth daily before breakfast. 90 tablet 3   rosuvastatin  (CRESTOR ) 40 MG  tablet Take 40 mg by mouth daily.     SPIRIVA  RESPIMAT 2.5 MCG/ACT AERS Inhale 2 puffs into the lungs daily as needed (respiratory issues.).     ursodiol  (URSO  FORTE) 500 MG tablet  Take 1 tablet (500 mg total) by mouth 2 (two) times daily. 180 tablet 3   Vibegron  (GEMTESA ) 75 MG TABS Take 1 tablet (75 mg total) by mouth daily.     No current facility-administered medications for this visit.     Past Surgical History:  Procedure Laterality Date   ABDOMINAL HYSTERECTOMY  1978   APPENDECTOMY     BIOPSY  04/28/2022   Procedure: BIOPSY;  Surgeon: Eartha Angelia Sieving, MD;  [facility];  Service: Gastroenterology;;   BLADDER INSTILLATION N/A 04/29/2023   Procedure: BLADDER INSTILLATION- gemcitabine ;  Surgeon: Sherrilee Belvie CROME, MD;  Location: [facility];  Service: Urology;  Laterality: N/A;   BREAST BIOPSY Left 1970s; 2015   BREAST LUMPECTOMY Left 1970s   BREAST LUMPECTOMY WITH NEEDLE LOCALIZATION Left 07/07/2013   Procedure: BREAST LUMPECTOMY WITH NEEDLE LOCALIZATION;  Surgeon: Deward GORMAN Curvin DOUGLAS, MD;  Location: [facility];  Service: General;  Laterality: Left;   CARDIAC CATHETERIZATION N/A 01/22/2016   Procedure: Left Heart Cath and Coronary Angiography;  Surgeon: Peter M Swaziland, MD;  Location: [facility] INVASIVE CV LAB;  Service: Cardiovascular;  Laterality: N/A;   CARDIAC CATHETERIZATION N/A 01/22/2016   Procedure: Coronary Stent Intervention;  Surgeon: Peter M Swaziland, MD;  Location: [facility] INVASIVE CV LAB;  Service: Cardiovascular;  Laterality: N/A;   CAROTID STENT Left 04/2003   small left parietal and left hemispheric CVA/notes 07/29/2010   CHOLECYSTECTOMY OPEN     COLONOSCOPY  2011   COLONOSCOPY N/A 05/10/2019   Procedure: COLONOSCOPY;  Surgeon: Golda Claudis PENNER, MD;  [facility]: [facility];  Service: Endoscopy;  Laterality: N/A;  1:45   COLONOSCOPY WITH PROPOFOL  N/A 09/01/2022   Procedure: COLONOSCOPY WITH PROPOFOL ;  Surgeon: Eartha Angelia Sieving, MD;  [facility]:  [facility];  Service: Gastroenterology;  Laterality: N/A;  1:45PM;ASA 3   CORONARY ATHERECTOMY N/A 12/29/2019   Procedure: CORONARY ATHERECTOMY;  Surgeon: Dann Candyce GORMAN, MD;  Location: [facility] INVASIVE CV LAB;  Service: Cardiovascular;  Laterality: N/A;  Prox RCA   CORONARY STENT INTERVENTION N/A 12/29/2019   Procedure: CORONARY STENT INTERVENTION;  Surgeon: Dann Candyce GORMAN, MD;  Location: [facility] INVASIVE CV LAB;  Service: Cardiovascular;  Laterality: N/A;  Prox RCA   CORONARY ULTRASOUND/IVUS N/A 12/29/2019   Procedure: Intravascular Ultrasound/IVUS;  Surgeon: Dann Candyce GORMAN, MD;  Location: [facility] INVASIVE CV LAB;  Service: Cardiovascular;  Laterality: N/A;   CYSTOSCOPY WITH BIOPSY N/A 04/29/2023   Procedure: CYSTOSCOPY WITH BIOPSY;  Surgeon: Sherrilee Belvie CROME, MD;  Location: [facility];  Service: Urology;  Laterality: N/A;   ESOPHAGEAL DILATION N/A 04/28/2022   Procedure: ESOPHAGEAL DILATION;  Surgeon: Eartha Angelia Sieving, MD;  [facility];  Service: Gastroenterology;  Laterality: N/A;   ESOPHAGOGASTRODUODENOSCOPY  08/06/2011   Procedure: ESOPHAGOGASTRODUODENOSCOPY (EGD);  Surgeon: Claudis PENNER Golda, MD;  [facility];  Service: Endoscopy;  Laterality: N/A;   ESOPHAGOGASTRODUODENOSCOPY N/A 05/10/2019   Procedure: ESOPHAGOGASTRODUODENOSCOPY (EGD);  Surgeon: Golda Claudis PENNER, MD;  [facility];  Service: Endoscopy;  Laterality: N/A;   ESOPHAGOGASTRODUODENOSCOPY (EGD) WITH PROPOFOL  N/A 04/28/2022   Procedure: ESOPHAGOGASTRODUODENOSCOPY (EGD) WITH PROPOFOL ;  Surgeon: Eartha Angelia Sieving, MD;  [facility];  Service: Gastroenterology;  Laterality: N/A;  10:30 am, pt can't move up due to transportation   FLEXIBLE SIGMOIDOSCOPY  08/06/2011   Procedure: FLEXIBLE SIGMOIDOSCOPY;  Surgeon: Claudis PENNER Golda, MD;  [facility];  Service: Endoscopy;  Laterality: N/A;   HEMOSTASIS CLIP PLACEMENT  09/01/2022  Procedure: HEMOSTASIS CLIP  PLACEMENT;  Surgeon: Eartha Flavors, Toribio, MD;  [facility];  Service: Gastroenterology;;   INGUINAL HERNIA REPAIR Left 1970s   IR ANGIO INTRA EXTRACRAN SEL COM CAROTID INNOMINATE BILAT MOD SED  11/01/2020   IR ANGIO VERTEBRAL SEL VERTEBRAL BILAT MOD SED  11/01/2020   IR RADIOLOGIST EVAL & MGMT  09/23/2020   IR US  GUIDE VASC ACCESS RIGHT  11/01/2020   LARYNX SURGERY  1970s   Polyps excised   LEFT HEART CATH AND CORONARY ANGIOGRAPHY N/A 12/29/2019   Procedure: LEFT HEART CATH AND CORONARY ANGIOGRAPHY;  Surgeon: Dann Candyce RAMAN, MD;  Location: [facility] INVASIVE CV LAB;  Service: Cardiovascular;  Laterality: N/A;   LEFT HEART CATH AND CORONARY ANGIOGRAPHY N/A 10/12/2022   Procedure: LEFT HEART CATH AND CORONARY ANGIOGRAPHY;  Surgeon: Dann Candyce RAMAN, MD;  Location: [facility] INVASIVE CV LAB;  Service: Cardiovascular;  Laterality: N/A;   MALONEY DILATION  05/10/2019   Procedure: AGAPITO DILATION;  Surgeon: Golda Claudis PENNER, MD;  [facility];  Service: Endoscopy;;   MELANOMA EXCISION Left ~ 2016   forearm   POLYPECTOMY  05/10/2019   Procedure: POLYPECTOMY;  Surgeon: Golda Claudis PENNER, MD;  [facility];  Service: Endoscopy;;  colon   POLYPECTOMY  09/01/2022   Procedure: POLYPECTOMY INTESTINAL;  Surgeon: Eartha Flavors Toribio, MD;  [facility];  Service: Gastroenterology;;   SPLENECTOMY, 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   spontaneous rupture   SPLENECTOMY, TOTAL     SUBMUCOSAL LIFTING INJECTION  09/01/2022   Procedure: SUBMUCOSAL LIFTING INJECTION;  Surgeon: Eartha Flavors Toribio, MD;  [facility];  Service: Gastroenterology;;   SUBMUCOSAL TATTOO INJECTION  09/01/2022   Procedure: SUBMUCOSAL TATTOO INJECTION;  Surgeon: Eartha Flavors Toribio, MD;  [facility];  Service: Gastroenterology;;   TEMPORARY PACEMAKER N/A 12/29/2019   Procedure: TEMPORARY PACEMAKER;  Surgeon: Dann Candyce RAMAN, MD;  Location: [facility] INVASIVE CV LAB;   Service: Cardiovascular;  Laterality: N/A;   TRANSURETHRAL RESECTION OF BLADDER TUMOR N/A 03/15/2023   Procedure: TRANSURETHRAL RESECTION OF BLADDER TUMOR (TURBT)with Gemcitibane;  Surgeon: Sherrilee Belvie CROME, MD;  Location: [facility];  Service: Urology;  Laterality: N/A;   TRANSURETHRAL RESECTION OF BLADDER TUMOR N/A 04/29/2023   Procedure: TRANSURETHRAL RESECTION OF BLADDER TUMOR (TURBT);  Surgeon: Sherrilee Belvie CROME, MD;  Location: [facility];  Service: Urology;  Laterality: N/A;   TRANSURETHRAL RESECTION OF BLADDER TUMOR N/A 09/09/2023   Procedure: TURBT (TRANSURETHRAL RESECTION OF BLADDER TUMOR);  Surgeon: Sherrilee Belvie CROME, MD;  Location: [facility];  Service: Urology;  Laterality: N/A;  with gemcitabine  instillation     Allergies  Allergen Reactions   Gabapentin  Nausea And Vomiting    Other Reaction(s): Not available   Prednisone  Palpitations    Other Reaction(s): Not available   Carvedilol      Stopped due to bradycardia   Clonidine Derivatives     Stopped due to bradycardia    Diltiazem      Stopped due to bradycardia    Tape Other (See Comments)    Causes blisters to form   Latex Other (See Comments) and Rash    Blisters  Other Reaction(s): Not available      Family History  Problem Relation Age of Onset   Heart failure Mother    Cancer Father    Heart failure Father    Cancer Sister    Dementia Sister    Heart disease Other    Arthritis Other    Cancer Other  Diabetes Other    Kidney disease Other    Cancer Sister    Heart failure Brother      Social History Ms. Dolin reports that she has been smoking cigarettes. She has a 20 pack-year smoking history. She has been exposed to tobacco smoke. She has never used smokeless tobacco. Ms. Bacchi reports that she does not currently use alcohol.    Physical Examination Today's Vitals   11/18/23 1253 11/18/23 1328  BP: (!) 150/82 134/78  Pulse: 92   Weight: 182 lb (82.6 kg)   Height: 5' 3 (1.6 m)    Body  mass index is 32.24 kg/m.  Gen: resting comfortably, no acute distress HEENT: no scleral icterus, pupils equal round and reactive, no palptable cervical adenopathy,  CV: RRR, no m/rg, no jvd Resp: Clear to auscultation bilaterally GI: abdomen is soft, non-tender, non-distended, normal bowel sounds, no hepatosplenomegaly MSK: extremities are warm, no edema.  Skin: warm, no rash Neuro:  no focal deficits Psych: appropriate affect   Diagnostic Studies  01/2016 cath The left ventricular systolic function is normal. LV end diastolic pressure is normal. The left ventricular ejection fraction is 55-65% by visual estimate. Prox RCA lesion, 90 %stenosed. 1st Diag lesion, 60 %stenosed. Prox LAD to Mid LAD lesion, 20 %stenosed. 2nd Diag lesion, 40 %stenosed. Ost Ramus to Ramus lesion, 50 %stenosed. 1st Mrg lesion, 99 %stenosed. A STENT SYNERGY DES 2.75X20 drug eluting stent was successfully placed. Post intervention, there is a 0% residual stenosis. Ost RCA to Prox RCA lesion, 90 %stenosed. Post intervention, there is a 90% residual stenosis with dissection from the proximal to mid RCA.   1. Severe 2 vessel obstructive CAD 2. Normal LV function and LV EDP 3. Successful stenting of the first OM with DES. This appears to be the culprit vessel. 4. Unsuccessful PCI of the RCA due to wire induced dissection of the vessel.    Plan: DAPT for one year. Aggressive BP control. Will continue IV Ntg overnight. Despite RCA dissection patient is pain free, hemodynamically stable and has a normal Ecg. If her symptoms remain stable I would treat medically and allow the RCA to heal. If she continues to have angina attempt at PCI of the RCA could be considered once the vessel has had time to heal - 6-8 weeks.      01/2016 echo Study Conclusions   - Left ventricle: The cavity size was normal. Systolic function was   vigorous. The estimated ejection fraction was in the range of 65%   to 70%. Wall motion  was normal; there were no regional wall   motion abnormalities. There was an increased relative   contribution of atrial contraction to ventricular filling.   Doppler parameters are consistent with abnormal left ventricular   relaxation (grade 1 diastolic dysfunction). Doppler parameters   are consistent with high ventricular filling pressure. - Tricuspid valve: There was trivial regurgitation. - Pulmonary arteries: PA peak pressure: 32 mm Hg (S). - Pericardium, extracardiac: A trivial, free-flowing pericardial   effusion was identified along the right ventricular free wall.   The fluid had no internal echoes.     12/2019 cath Previously placed 1st Mrg drug eluting stent is widely patent. Ost RCA to Prox RCA lesion is 90% stenosed. After orbital atherectomy drug-eluting stent was successfully placed using a STENT RESOLUTE ONYX 3.0X26, and postdilated to 3.75 mm. Stent was optimized with IVUS. Post intervention, there is a 0% residual stenosis. The left ventricular systolic function is normal. LV end  diastolic pressure is normal. The left ventricular ejection fraction is 55-65% by visual estimate. There is no aortic valve stenosis. RCA was prone to vasospasm during the PCI.   Continue dual antiplatelet therapy for at least 6 months.  Consider clopidogrel  monotherapy long-term given some diffuse disease in her LAD system.  Continue with attempts at aggressive secondary prevention.   Plan for same day discharge.     08/2020 nuclear stress Lexiscan  stress is electrically negative for ischemia Myoview  scan shows mild thinning of anterior wall (mid/distal) with decreased tracer activity consistent with mild anterior ischemia; Cannot exclude however that changes are due to shifting breast attenuation. LVEF calculated at 80% This is a low risk study.           Assessment and Plan  1. CAD with stable angina - recent arm pains she reports similar to her prior angina, resolves with  NG - plan for lexiscan . High threshold for cath given recurrent bladder cancer with ongoing chemo.   - EKG shows NSR, no ischemic changes   2.HTN - above goal. Increased LE edema, would lower her norvasc  back to 5mg  daily. In its place increase her losartan  to 50mg  daily.    4. Chronic diastolic HF - continue prn lasix .    5. Hyperlipidemia - LDL above goal of <55, will add zetia  to her crestor  40mg  daily.      Dorn PHEBE Ross, M.D.

## 2023-11-18 NOTE — Patient Instructions (Signed)
 Medication Instructions:   DECREASE Amlodipine  to 5 mg daily  INCREASE Losartan  to 50 mg daily   START Zetia  10 mg daily    Labwork: None today  Testing/Procedures: Your physician has requested that you have a lexiscan  myoview . For further information please visit [email or web address] Please follow instruction sheet, as given.   Follow-Up: 3 months  Any Other Special Instructions Will Be Listed Below (If Applicable).  If you need a refill on your cardiac medications before your next appointment, please call your pharmacy.

## 2023-11-23 ENCOUNTER — Encounter: Payer: Self-pay | Admitting: Urology

## 2023-11-23 NOTE — Patient Instructions (Signed)

## 2023-11-23 NOTE — Progress Notes (Signed)
 11/17/2023 8:07 AM   [name] 14-Jan-1951 [id number]  Referring provider: Shona Norleen PEDLAR, MD [address] [name],  [place] [address]  Followup bladder cancer   HPI: [name] is a 72yo here for BCG therapy for high grade bladder cancer. She is tolerating BCG well. She denies nay worsening LUTS. No hematuria.    PMH: Past Medical History:  Diagnosis Date   Anxiety    Arthritis    hands, arms, back, neck, knees, fingers (01/21/2016)   Bradycardia    Breast cancer, left breast (HCC) 1970s; 2015   CAD (coronary artery disease)    a. s/p NSTEMI in 2017 with DES to OM and unsuccessful PCI to RCA with wire dissection b. 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   History of hiatal hernia    History of kidney stones    Hyperlipidemia    Hypertension    Hypothyroidism    Long term prescription benzodiazepine use 12/10/2022   Melanoma of forearm, left (HCC)    Migraine    a few migraines/year (01/21/2016)   Obesity    Polypharmacy 12/10/2022   Stroke (HCC) 04/2003   2 carotid artery stents in place; small left parietal and left hemispheric CVA.hazeline 01/21/2016   Tobacco use disorder    50 pack years; questionably discontinued in 2010   Wears glasses     Surgical History: Past Surgical History:  Procedure Laterality Date   ABDOMINAL HYSTERECTOMY  1978   APPENDECTOMY     BIOPSY  04/28/2022   Procedure: BIOPSY;  Surgeon: Eartha Angelia Sieving, MD;  [facility];  Service: Gastroenterology;;   BLADDER INSTILLATION N/A 04/29/2023   Procedure: BLADDER INSTILLATION- gemcitabine ;  Surgeon:  Sherrilee Belvie CROME, MD;  Location: [facility];  Service: Urology;  Laterality: N/A;   BREAST BIOPSY Left 1970s; 2015   BREAST LUMPECTOMY Left 1970s   BREAST LUMPECTOMY WITH NEEDLE LOCALIZATION Left 07/07/2013   Procedure: BREAST LUMPECTOMY WITH NEEDLE LOCALIZATION;  Surgeon: Deward GORMAN Curvin DOUGLAS, MD;  Location: [facility];  Service: General;  Laterality: Left;   CARDIAC CATHETERIZATION N/A 01/22/2016   Procedure: Left Heart Cath and Coronary Angiography;  Surgeon: Peter M Swaziland, MD;  Location: [facility] INVASIVE CV LAB;  Service: Cardiovascular;  Laterality: N/A;   CARDIAC CATHETERIZATION N/A 01/22/2016   Procedure: Coronary Stent Intervention;  Surgeon: Peter M Swaziland, MD;  Location: [facility] INVASIVE CV LAB;  Service: Cardiovascular;  Laterality: N/A;   CAROTID STENT Left 04/2003   small left parietal and left hemispheric CVA/notes 07/29/2010   CHOLECYSTECTOMY OPEN     COLONOSCOPY  2011   COLONOSCOPY N/A 05/10/2019   Procedure: COLONOSCOPY;  Surgeon: Golda Claudis PENNER, MD;  [facility];  Service: Endoscopy;  Laterality: N/A;  1:45   COLONOSCOPY WITH PROPOFOL  N/A 09/01/2022   Procedure: COLONOSCOPY WITH PROPOFOL ;  Surgeon: Eartha Angelia Sieving, MD;  [facility];  Service: Gastroenterology;  Laterality: N/A;  1:45PM;ASA 3   CORONARY ATHERECTOMY N/A 12/29/2019  Procedure: CORONARY ATHERECTOMY;  Surgeon: Dann Candyce RAMAN, MD;  Location: [facility] INVASIVE CV LAB;  Service: Cardiovascular;  Laterality: N/A;  Prox RCA   CORONARY STENT INTERVENTION N/A 12/29/2019   Procedure: CORONARY STENT INTERVENTION;  Surgeon: Dann Candyce RAMAN, MD;  Location: [facility] INVASIVE CV LAB;  Service: Cardiovascular;  Laterality: N/A;  Prox RCA   CORONARY ULTRASOUND/IVUS N/A 12/29/2019   Procedure: Intravascular Ultrasound/IVUS;  Surgeon: Dann Candyce RAMAN, MD;  Location: [facility] INVASIVE CV LAB;  Service: Cardiovascular;  Laterality: N/A;   CYSTOSCOPY WITH BIOPSY N/A 04/29/2023   Procedure: CYSTOSCOPY  WITH BIOPSY;  Surgeon: Sherrilee Belvie CROME, MD;  Location: [facility];  Service: Urology;  Laterality: N/A;   ESOPHAGEAL DILATION N/A 04/28/2022   Procedure: ESOPHAGEAL DILATION;  Surgeon: Eartha Angelia Sieving, MD;  [facility];  Service: Gastroenterology;  Laterality: N/A;   ESOPHAGOGASTRODUODENOSCOPY  08/06/2011   Procedure: ESOPHAGOGASTRODUODENOSCOPY (EGD);  Surgeon: Claudis RAYMOND Rivet, MD;  [facility];  Service: Endoscopy;  Laterality: N/A;   ESOPHAGOGASTRODUODENOSCOPY N/A 05/10/2019   Procedure: ESOPHAGOGASTRODUODENOSCOPY (EGD);  Surgeon: Rivet Claudis RAYMOND, MD;  [facility];  Service: Endoscopy;  Laterality: N/A;   ESOPHAGOGASTRODUODENOSCOPY (EGD) WITH PROPOFOL  N/A 04/28/2022   Procedure: ESOPHAGOGASTRODUODENOSCOPY (EGD) WITH PROPOFOL ;  Surgeon: Eartha Angelia Sieving, MD;  [facility];  Service: Gastroenterology;  Laterality: N/A;  10:30 am, pt can't move up due to transportation   FLEXIBLE SIGMOIDOSCOPY  08/06/2011   Procedure: FLEXIBLE SIGMOIDOSCOPY;  Surgeon: Claudis RAYMOND Rivet, MD;  [facility]: [facility];  Service: Endoscopy;  Laterality: N/A;   HEMOSTASIS CLIP PLACEMENT  09/01/2022   Procedure: HEMOSTASIS CLIP PLACEMENT;  Surgeon: Eartha Angelia Sieving, MD;  [facility];  Service: Gastroenterology;;   INGUINAL HERNIA REPAIR Left 1970s   IR ANGIO INTRA EXTRACRAN SEL COM CAROTID INNOMINATE BILAT MOD SED  11/01/2020   IR ANGIO VERTEBRAL SEL VERTEBRAL BILAT MOD SED  11/01/2020   IR RADIOLOGIST EVAL & MGMT  09/23/2020   IR US  GUIDE VASC ACCESS RIGHT  11/01/2020   LARYNX SURGERY  1970s   Polyps excised   LEFT HEART CATH AND CORONARY ANGIOGRAPHY N/A 12/29/2019   Procedure: LEFT HEART CATH AND CORONARY ANGIOGRAPHY;  Surgeon: Dann Candyce RAMAN, MD;  Location: [facility] INVASIVE CV LAB;  Service: Cardiovascular;  Laterality: N/A;   LEFT HEART CATH AND CORONARY ANGIOGRAPHY N/A 10/12/2022   Procedure: LEFT HEART CATH AND CORONARY  ANGIOGRAPHY;  Surgeon: Dann Candyce RAMAN, MD;  Location: [facility] INVASIVE CV LAB;  Service: Cardiovascular;  Laterality: N/A;   MALONEY DILATION  05/10/2019   Procedure: AGAPITO DILATION;  Surgeon: Rivet Claudis RAYMOND, MD;  [facility];  Service: Endoscopy;;   MELANOMA EXCISION Left ~ 2016   forearm   POLYPECTOMY  05/10/2019   Procedure: POLYPECTOMY;  Surgeon: Rivet Claudis RAYMOND, MD;  [facility];  Service: Endoscopy;;  colon   POLYPECTOMY  09/01/2022   Procedure: POLYPECTOMY INTESTINAL;  Surgeon: Eartha Angelia Sieving, MD;  [facility];  Service: Gastroenterology;;   SPLENECTOMY, 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   spontaneous rupture   SPLENECTOMY, TOTAL     SUBMUCOSAL LIFTING INJECTION  09/01/2022   Procedure: SUBMUCOSAL LIFTING INJECTION;  Surgeon: Eartha Angelia Sieving, MD;  [facility];  Service: Gastroenterology;;   SUBMUCOSAL TATTOO INJECTION  09/01/2022   Procedure: SUBMUCOSAL TATTOO INJECTION;  Surgeon: Eartha Angelia Sieving, MD;  [facility];  Service: Gastroenterology;;   TEMPORARY PACEMAKER N/A 12/29/2019   Procedure: TEMPORARY PACEMAKER;  Surgeon: Dann Candyce RAMAN, MD;  Location: [facility] INVASIVE CV LAB;  Service: Cardiovascular;  Laterality: N/A;   TRANSURETHRAL RESECTION OF BLADDER TUMOR N/A 03/15/2023   Procedure: TRANSURETHRAL RESECTION OF BLADDER TUMOR (TURBT)with Gemcitibane;  Surgeon: Sherrilee Belvie CROME, MD;  Location: [facility];  Service: Urology;  Laterality: N/A;   TRANSURETHRAL RESECTION OF BLADDER TUMOR N/A 04/29/2023   Procedure: TRANSURETHRAL RESECTION OF BLADDER TUMOR (TURBT);  Surgeon: Sherrilee Belvie CROME, MD;  Location: [facility];  Service: Urology;  Laterality: N/A;   TRANSURETHRAL RESECTION OF BLADDER TUMOR N/A 09/09/2023   Procedure: TURBT (TRANSURETHRAL RESECTION OF BLADDER TUMOR);  Surgeon: Sherrilee Belvie CROME, MD;  Location: [facility];  Service: Urology;  Laterality: N/A;  with gemcitabine  instillation    Home  Medications:  Allergies as of 11/17/2023       Reactions   Gabapentin  Nausea And Vomiting   Other Reaction(s): Not available   Prednisone  Palpitations   Other Reaction(s): Not available   Carvedilol     Stopped due to bradycardia   Clonidine Derivatives    Stopped due to bradycardia   Diltiazem     Stopped due to bradycardia   Tape Other (See Comments)   Causes blisters to form   Latex Other (See Comments), Rash   Blisters Other Reaction(s): Not available        Medication List        Accurate as of November 17, 2023 11:59 PM. If you have any questions, ask your nurse or doctor.          albuterol  108 (90 Base) MCG/ACT inhaler Commonly known as: VENTOLIN  HFA Inhale 1-2 puffs into the lungs every 6 (six) hours as needed for wheezing or shortness of breath.   amLODipine  5 MG tablet Commonly known as: NORVASC  Take 5 mg by mouth in the morning.   aspirin  EC 81 MG tablet Take 81 mg by mouth in the morning.   azaTHIOprine  50 MG tablet Commonly known as: IMURAN  Take 1 tablet (50 mg total) by mouth daily.   cetirizine 10 MG tablet Commonly known as: ZYRTEC Take 10 mg by mouth in the morning.   clopidogrel  75 MG tablet Commonly known as: PLAVIX  Take 75 mg by mouth daily.   furosemide  40 MG tablet Commonly known as: LASIX  Take 40 mg by mouth daily as needed for edema.   Gemtesa  75 MG Tabs Generic drug: Vibegron  Take 1 tablet (75 mg total) by mouth daily.   HYDROcodone -acetaminophen  10-325 MG tablet Commonly known as: NORCO Take 1 tablet by mouth every 6 (six) hours as needed for moderate pain (pain score 4-6).   HYDROcodone -acetaminophen  10-325 MG tablet Commonly known as: NORCO Take 1 tablet by mouth every 6 (six) hours as needed.   Jardiance 10 MG Tabs tablet Generic drug: empagliflozin TAKE (1) TABLET BY MOUTH EACH MORNING.   levothyroxine  50 MCG tablet Commonly known as: SYNTHROID  Take 50 mcg by mouth in the morning.   losartan  25 MG  tablet Commonly known as: COZAAR  Take 25 mg by mouth in the morning.   nitrofurantoin  (macrocrystal-monohydrate) 100 MG capsule Commonly known as: MACROBID  Take 1 capsule (100 mg total) by mouth every 12 (twelve) hours.   nitroGLYCERIN  0.4 MG SL tablet Commonly known as: NITROSTAT  Place 0.4 mg under the tongue every 5 (five) minutes x 3 doses as needed for chest pain.   ondansetron  4 MG disintegrating tablet Commonly known as: ZOFRAN -ODT Take 4 mg by mouth every 8 (eight) hours as needed for nausea or vomiting.   pantoprazole   40 MG tablet Commonly known as: PROTONIX  Take 1 tablet (40 mg total) by mouth daily before breakfast.   rosuvastatin  40 MG tablet Commonly known as: CRESTOR  Take 40 mg by mouth daily.   Spiriva  Respimat 2.5 MCG/ACT Aers Generic drug: Tiotropium Bromide  Monohydrate Inhale 2 puffs into the lungs daily as needed (respiratory issues.).   ursodiol  500 MG tablet Commonly known as: Urso  Forte Take 1 tablet (500 mg total) by mouth 2 (two) times daily.   Vitamin D3 50 MCG (2000 UT) Tabs Take 2,000 Units by mouth in the morning.        Allergies:  Allergies  Allergen Reactions   Gabapentin  Nausea And Vomiting    Other Reaction(s): Not available   Prednisone  Palpitations    Other Reaction(s): Not available   Carvedilol      Stopped due to bradycardia   Clonidine Derivatives     Stopped due to bradycardia    Diltiazem      Stopped due to bradycardia    Tape Other (See Comments)    Causes blisters to form   Latex Other (See Comments) and Rash    Blisters  Other Reaction(s): Not available    Family History: Family History  Problem Relation Age of Onset   Heart failure Mother    Cancer Father    Heart failure Father    Cancer Sister    Dementia Sister    Heart disease Other    Arthritis Other    Cancer Other    Diabetes Other    Kidney disease Other    Cancer Sister    Heart failure Brother     Social History:  reports that she has  been smoking cigarettes. She has a 20 pack-year smoking history. She has been exposed to tobacco smoke. She has never used smokeless tobacco. She reports that she does not currently use alcohol. She reports that she does not use drugs.  ROS: All other review of systems were reviewed and are negative except what is noted above in HPI  Physical Exam: BP 133/85   Pulse 81   Constitutional:  Alert and oriented, No acute distress. HEENT: [place] AT, moist mucus membranes.  Trachea midline, no masses. Cardiovascular: No clubbing, cyanosis, or edema. Respiratory: Normal respiratory effort, no increased work of breathing. GI: Abdomen is soft, nontender, nondistended, no abdominal masses GU: No CVA tenderness.  Lymph: No cervical or inguinal lymphadenopathy. Skin: No rashes, bruises or suspicious lesions. Neurologic: Grossly intact, no focal deficits, moving all 4 extremities. Psychiatric: Normal mood and affect.  Laboratory Data: Lab Results  Component Value Date   WBC 8.2 09/24/2023   HGB 14.1 09/24/2023   HCT 42.7 09/24/2023   MCV 90.3 09/24/2023   PLT 410 (H) 09/24/2023    Lab Results  Component Value Date   CREATININE 0.65 09/24/2023    No results found for: PSA  No results found for: TESTOSTERONE  Lab Results  Component Value Date   HGBA1C 7.1 (H) 10/19/2022    Urinalysis    Component Value Date/Time   COLORURINE RED (A) 09/24/2023 1739   APPEARANCEUR Clear 11/17/2023 1438   LABSPEC  09/24/2023 1739    TEST NOT REPORTED DUE TO COLOR INTERFERENCE OF URINE PIGMENT   PHURINE  09/24/2023 1739    TEST NOT REPORTED DUE TO COLOR INTERFERENCE OF URINE PIGMENT   GLUCOSEU 3+ (A) 11/17/2023 1438   HGBUR (A) 09/24/2023 1739    TEST NOT REPORTED DUE TO COLOR INTERFERENCE OF URINE PIGMENT  BILIRUBINUR Negative 11/17/2023 1438   KETONESUR (A) 09/24/2023 1739    TEST NOT REPORTED DUE TO COLOR INTERFERENCE OF URINE PIGMENT   PROTEINUR Negative 11/17/2023 1438   PROTEINUR  NEGATIVE 09/24/2023 1739   UROBILINOGEN 1.0 07/01/2023 1121   UROBILINOGEN 0.2 11/03/2014 1722   NITRITE Negative 11/17/2023 1438   NITRITE (A) 09/24/2023 1739    TEST NOT REPORTED DUE TO COLOR INTERFERENCE OF URINE PIGMENT   LEUKOCYTESUR Negative 11/17/2023 1438   LEUKOCYTESUR (A) 09/24/2023 1739    TEST NOT REPORTED DUE TO COLOR INTERFERENCE OF URINE PIGMENT    Lab Results  Component Value Date   LABMICR Comment 11/17/2023   WBCUA 0-5 09/22/2023   LABEPIT 0-10 09/22/2023   BACTERIA RARE (A) 09/24/2023    Pertinent Imaging:  No results found for this or any previous visit.  Results for orders placed during the hospital encounter of 10/05/07  US  VenoUS Imaging Bilateral  Narrative Clinical Data: Left leg pain and swelling  VENOUS DUPLEX ULTRASOUND OF BILATERAL LOWER EXTREMITIES  Technique: Gray-scale sonography with graded compression, as well as color Doppler and duplex ultrasound, were performed to evaluate the deep venous system of both lower extremities from the level of the common femoral vein through the popliteal and proximal calf veins. Spectral Doppler was utilized to evaulate flow at rest and with distal augmentation maneuvers. Confirmed with physician's office that bilateral lower extremity exam requested.  Comparison: None  Findings: Deep venous system patent and compressible from the groin through popliteal fossa bilaterally.SABRA Spontaneous venous flow present with intact augmentation and evidence of respiratory phasicity. No intraluminal thrombus identified. Visualized portions of the greater saphenous systems unremarkable.  IMPRESSION: No evidence of deep venous thrombosis.  Provider: Montie Axe  No results found for this or any previous visit.  No results found for this or any previous visit.  Results for orders placed during the hospital encounter of 02/02/23  US  Renal  Narrative CLINICAL DATA:  Painless hematuria  EXAM: RENAL /  URINARY TRACT ULTRASOUND COMPLETE  COMPARISON:  CT renal stone 01/31/2023  FINDINGS: Right Kidney:  Renal measurements: 12.7 x 4.4 x 5.7 cm = volume: 161 mL. Echogenicity within normal limits. No mass or hydronephrosis visualized.  Left Kidney:  Renal measurements: 11.5 x 6.4 x 5.3 cm = volume: 205 mL. Echogenicity within normal limits. No mass or hydronephrosis visualized.  Bladder:  Under distended and not well evaluated.  Other:  None.  IMPRESSION: Normal renal ultrasound.   Electronically Signed By: Greig Pique M.D. On: 02/02/2023 18:00  No results found for this or any previous visit.  Results for orders placed in visit on 02/03/23  CT HEMATURIA WORKUP  Narrative CLINICAL DATA:  Gross hematuria.  EXAM: CT ABDOMEN AND PELVIS WITHOUT AND WITH CONTRAST  TECHNIQUE: Multidetector CT imaging of the abdomen and pelvis was performed following the standard protocol before and following the bolus administration of intravenous contrast.  RADIATION DOSE REDUCTION: This exam was performed according to the departmental dose-optimization program which includes automated exposure control, adjustment of the mA and/or kV according to patient size and/or use of iterative reconstruction technique.  CONTRAST:  OMNIPAQUE  IOHEXOL  300 MG/ML  SOLN  COMPARISON:  CT renal stone protocol from 01/31/2023.  FINDINGS: Lower chest: There is a spiculated marginated 1.6 x 2.1 cm nodule in the right lung lower lobe, which was present but partially imaged on the recent prior exam. There is also a new pleural-based 8 x 14 mm nodule in the right lung lower  lobe. Findings are concerning for neoplastic process. Further evaluation with dedicated contrast-enhanced chest CT scan is recommended. The lung bases are otherwise clear. No pleural effusion or consolidation. The heart is normal in size. No pericardial effusion.  Hepatobiliary: The liver is normal in size.  Non-cirrhotic configuration. No suspicious mass. No intrahepatic bile duct dilation. There is moderate dilation of the extrahepatic bile duct, most likely due to post cholecystectomy status. Gallbladder is surgically absent.  Pancreas: Unremarkable. No pancreatic ductal dilatation or surrounding inflammatory changes.  Spleen: Surgically absent. There is an accessory splenule in the left upper quadrant. There are also multiple smaller hyperattenuating nodules along the left diaphragmatic undersurface, also favored to represent splenules.  Adrenals/Urinary Tract: Adrenal glands are unremarkable.  Non-contrast images: No radiopaque urinary tract calculi.  Kidneys: Symmetric enhancement. No suspicious renal mass.  Urinary Tract Opacification: Adequate.  Collecting Systems and Ureters: No filling defects, masses, strictures, or areas of abnormal dilatation.  Urinary Bladder: Is decompressed precluding optimal assessment. However, note is made of mild bladder wall mucosal hyperenhancement and subtle perivesical fat stranding, concerning for cystitis. Correlate clinically and with urinalysis. No focal mass or bladder calculi.  Stomach/Bowel: No disproportionate dilation of the small or large bowel loops. No evidence of abnormal bowel wall thickening or inflammatory changes. The appendix was not visualized; however there is no acute inflammatory process in the right lower quadrant.  Vascular/Lymphatic: No ascites or pneumoperitoneum. No abdominal or pelvic lymphadenopathy, by size criteria. No aneurysmal dilation of the major abdominal arteries. There are moderate peripheral atherosclerotic vascular calcifications of the aorta and its major branches.  Reproductive: The uterus is surgically absent. No large adnexal mass.  Other: There is a tiny fat containing umbilical hernia. The soft tissues and abdominal wall are otherwise unremarkable.  Musculoskeletal: No suspicious  osseous lesions. There are mild multilevel degenerative changes in the visualized spine.  IMPRESSION: *No nephroureterolithiasis or obstructive uropathy. No suspicious renal, ureteric or urinary bladder mass. Mild urinary bladder wall mucosal hyperenhancement and subtle perivesical fat stranding, concerning for cystitis. Correlate clinically and with urinalysis. *There is a spiculated marginated 1.6 x 2.1 cm nodule in the right lung lower lobe and an additional pleural-based 8 x 14 mm nodule in the right lung lower lobe, which is new since the recent prior exam. Findings are concerning for neoplastic process. Further evaluation with dedicated contrast-enhanced chest CT scan is recommended, unless recently performed. *Multiple other nonacute observations, as described above.  Aortic Atherosclerosis (ICD10-I70.0).   Electronically Signed By: Ree Molt M.D. On: 02/23/2023 14:38  Results for orders placed during the hospital encounter of 09/24/23  CT Renal Stone Study  Narrative CLINICAL DATA:  TURBT 09/09/2023.  Hematuria.  EXAM: CT ABDOMEN AND PELVIS WITHOUT CONTRAST  TECHNIQUE: Multidetector CT imaging of the abdomen and pelvis was performed following the standard protocol without IV contrast.  RADIATION DOSE REDUCTION: This exam was performed according to the departmental dose-optimization program which includes automated exposure control, adjustment of the mA and/or kV according to patient size and/or use of iterative reconstruction technique.  COMPARISON:  02/13/2023  FINDINGS: Lower chest: The medial right base lung nodule has resolved. The vertical level of the 2 cm right lower lobe pulmonary nodule shown on the 02/13/2023 exam is not included on today's exam, and accordingly we were not able to determine whether this has resolved or if it represents a lung cancer. Did the patient undergo the recommended chest CT? If not, chest CT at this time is  recommended to exclude  lung cancer.  Coronary and descending thoracic aortic atherosclerosis.  Hepatobiliary: Nodularity of the liver suggests cirrhosis. Gallbladder absent. Continued mild extrahepatic biliary dilatation.  Pancreas: Unremarkable  Spleen: Splenectomy with regenerative splenic tissue noted.  Adrenals/Urinary Tract: No hydronephrosis or hydroureter. The urinary bladder is empty. Adrenal glands unremarkable. No urinary tract calculi.  Stomach/Bowel: Mild sigmoid colon diverticulosis. 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  Vascular/Lymphatic: Atherosclerosis is present, including aortoiliac atherosclerotic disease. Numerous small and upper normal size retroperitoneal lymph nodes as before.  Reproductive: Uterus absent.  Other: No supplemental non-categorized findings.  Musculoskeletal: Mild grade 1 degenerative anterolisthesis at L3-4. Mild degenerative hip arthropathy bilaterally.  IMPRESSION: 1. The more caudad medial right base lung nodule shown on prior chest CT has resolved. However, vertical level of the 2 cm right lower lobe pulmonary nodule shown on the 02/13/2023 exam is not included on today's exam, and accordingly we were not able to determine whether this has resolved or if it represents a lung cancer. Did the patient undergo the recommended chest CT? If not, chest CT at this time is recommended to exclude lung cancer. 2. Nodularity of the liver suggests cirrhosis. 3. Mild sigmoid colon diverticulosis. 4. Mild grade 1 degenerative anterolisthesis at L3-4. 5. Mild degenerative hip arthropathy bilaterally. 6.  Aortic Atherosclerosis (ICD10-I70.0).   Electronically Signed By: Ryan Salvage M.D. On: 09/24/2023 20:48   Assessment & Plan:    1. Malignant neoplasm of lateral wall of urinary bladder (HCC) (Primary) Continue BCG therapy - bcg vaccine injection 81 mg - In and Out Cath - Urinalysis, Routine w reflex microscopic   No  follow-ups on file.  Belvie Clara, MD  [facility] [place]

## 2023-11-25 ENCOUNTER — Ambulatory Visit: Admitting: Cardiology

## 2023-11-26 ENCOUNTER — Encounter: Payer: Self-pay | Admitting: Urology

## 2023-11-26 ENCOUNTER — Ambulatory Visit: Admitting: Urology

## 2023-11-26 VITALS — BP 170/73 | HR 91

## 2023-11-26 DIAGNOSIS — C672 Malignant neoplasm of lateral wall of bladder: Secondary | ICD-10-CM

## 2023-11-26 LAB — URINALYSIS, ROUTINE W REFLEX MICROSCOPIC
Bilirubin, UA: NEGATIVE
Ketones, UA: NEGATIVE
Leukocytes,UA: NEGATIVE
Nitrite, UA: NEGATIVE
Protein,UA: NEGATIVE
Specific Gravity, UA: 1.015 (ref 1.005–1.030)
Urobilinogen, Ur: 0.2 mg/dL (ref 0.2–1.0)
pH, UA: 6 (ref 5.0–7.5)

## 2023-11-26 MED ORDER — FLUCONAZOLE 150 MG PO TABS: 150.0000 mg | ORAL_TABLET | Freq: Every day | ORAL | 4 refills | Status: AC

## 2023-11-26 MED ORDER — BCG LIVE 50 MG IS SUSR
3.2400 mL | Freq: Once | INTRAVESICAL | Status: AC
  Administered 2023-11-26: 81 mg via INTRAVESICAL

## 2023-11-26 NOTE — Patient Instructions (Signed)

## 2023-11-26 NOTE — Progress Notes (Signed)
 11/26/2023 9:08 AM   [name] 1950/11/04 [id number]  Referring provider: Shona Norleen PEDLAR, MD [address] [name],  [place] [address]  Followup bladder cancer   HPI: [name] is a 73yo here for followup for bladder cancer. She is doing well with the BCG therapy. She has developed a vaginal yeast infection while on jardiance. She denies nay worsening LUTS.    PMH: Past Medical History:  Diagnosis Date   Anxiety    Arthritis    hands, arms, back, neck, knees, fingers (01/21/2016)   Bradycardia    Breast cancer, left breast (HCC) 1970s; 2015   CAD (coronary artery disease)    a. s/p NSTEMI in 2017 with DES to OM and unsuccessful PCI to RCA with wire dissection b. 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   History of hiatal hernia    History of kidney stones    Hyperlipidemia    Hypertension    Hypothyroidism    Long term prescription benzodiazepine use 12/10/2022   Melanoma of forearm, left (HCC)    Migraine    a few migraines/year (01/21/2016)   Obesity    Polypharmacy 12/10/2022   Stroke (HCC) 04/2003   2 carotid artery stents in place; small left parietal and left hemispheric CVA.hazeline 01/21/2016   Tobacco use disorder    50 pack years; questionably discontinued in 2010   Wears glasses     Surgical History: Past Surgical History:  Procedure Laterality Date   ABDOMINAL HYSTERECTOMY  1978   APPENDECTOMY     BIOPSY  04/28/2022   Procedure: BIOPSY;  Surgeon: Eartha Angelia Sieving, MD;  [facility];  Service: Gastroenterology;;   BLADDER INSTILLATION N/A 04/29/2023   Procedure:  BLADDER INSTILLATION- gemcitabine ;  Surgeon: Sherrilee Belvie CROME, MD;  Location: [facility];  Service: Urology;  Laterality: N/A;   BREAST BIOPSY Left 1970s; 2015   BREAST LUMPECTOMY Left 1970s   BREAST LUMPECTOMY WITH NEEDLE LOCALIZATION Left 07/07/2013   Procedure: BREAST LUMPECTOMY WITH NEEDLE LOCALIZATION;  Surgeon: Deward GORMAN Curvin DOUGLAS, MD;  Location: [facility];  Service: General;  Laterality: Left;   CARDIAC CATHETERIZATION N/A 01/22/2016   Procedure: Left Heart Cath and Coronary Angiography;  Surgeon: Peter M Swaziland, MD;  Location: [facility] INVASIVE CV LAB;  Service: Cardiovascular;  Laterality: N/A;   CARDIAC CATHETERIZATION N/A 01/22/2016   Procedure: Coronary Stent Intervention;  Surgeon: Peter M Swaziland, MD;  Location: [facility] INVASIVE CV LAB;  Service: Cardiovascular;  Laterality: N/A;   CAROTID STENT Left 04/2003   small left parietal and left hemispheric CVA/notes 07/29/2010   CHOLECYSTECTOMY OPEN     COLONOSCOPY  2011   COLONOSCOPY N/A 05/10/2019   Procedure: COLONOSCOPY;  Surgeon: Golda Claudis PENNER, MD;  [facility];  Service: Endoscopy;  Laterality: N/A;  1:45   COLONOSCOPY WITH PROPOFOL  N/A 09/01/2022   Procedure: COLONOSCOPY WITH PROPOFOL ;  Surgeon: Eartha Angelia Sieving, MD;  [facility];  Service: Gastroenterology;  Laterality: N/A;  1:45PM;ASA 3   CORONARY ATHERECTOMY N/A 12/29/2019   Procedure: CORONARY ATHERECTOMY;  Surgeon: Dann Candyce RAMAN, MD;  Location: [facility] INVASIVE CV LAB;  Service: Cardiovascular;  Laterality: N/A;  Prox RCA   CORONARY STENT INTERVENTION N/A 12/29/2019   Procedure: CORONARY STENT INTERVENTION;  Surgeon: Dann Candyce RAMAN, MD;  Location: [facility] INVASIVE CV LAB;  Service: Cardiovascular;  Laterality: N/A;  Prox RCA   CORONARY ULTRASOUND/IVUS N/A 12/29/2019   Procedure: Intravascular Ultrasound/IVUS;  Surgeon: Dann Candyce RAMAN, MD;  Location: [facility] INVASIVE CV LAB;  Service: Cardiovascular;  Laterality: N/A;   CYSTOSCOPY WITH  BIOPSY N/A 04/29/2023   Procedure: CYSTOSCOPY WITH BIOPSY;  Surgeon: Sherrilee Belvie CROME, MD;  Location: [facility];  Service: Urology;  Laterality: N/A;   ESOPHAGEAL DILATION N/A 04/28/2022   Procedure: ESOPHAGEAL DILATION;  Surgeon: Eartha Angelia Sieving, MD;  [facility];  Service: Gastroenterology;  Laterality: N/A;   ESOPHAGOGASTRODUODENOSCOPY  08/06/2011   Procedure: ESOPHAGOGASTRODUODENOSCOPY (EGD);  Surgeon: Claudis RAYMOND Rivet, MD;  [facility];  Service: Endoscopy;  Laterality: N/A;   ESOPHAGOGASTRODUODENOSCOPY N/A 05/10/2019   Procedure: ESOPHAGOGASTRODUODENOSCOPY (EGD);  Surgeon: Rivet Claudis RAYMOND, MD;  [facility];  Service: Endoscopy;  Laterality: N/A;   ESOPHAGOGASTRODUODENOSCOPY (EGD) WITH PROPOFOL  N/A 04/28/2022   Procedure: ESOPHAGOGASTRODUODENOSCOPY (EGD) WITH PROPOFOL ;  Surgeon: Eartha Angelia Sieving, MD;  [facility];  Service: Gastroenterology;  Laterality: N/A;  10:30 am, pt can't move up due to transportation   FLEXIBLE SIGMOIDOSCOPY  08/06/2011   Procedure: FLEXIBLE SIGMOIDOSCOPY;  Surgeon: Claudis RAYMOND Rivet, MD;  [facility];  Service: Endoscopy;  Laterality: N/A;   HEMOSTASIS CLIP PLACEMENT  09/01/2022   Procedure: HEMOSTASIS CLIP PLACEMENT;  Surgeon: Eartha Angelia Sieving, MD;  [facility];  Service: Gastroenterology;;   INGUINAL HERNIA REPAIR Left 1970s   IR ANGIO INTRA EXTRACRAN SEL COM CAROTID INNOMINATE BILAT MOD SED  11/01/2020   IR ANGIO VERTEBRAL SEL VERTEBRAL BILAT MOD SED  11/01/2020   IR RADIOLOGIST EVAL & MGMT  09/23/2020   IR US  GUIDE VASC ACCESS RIGHT  11/01/2020   LARYNX SURGERY  1970s   Polyps excised   LEFT HEART CATH AND CORONARY ANGIOGRAPHY N/A 12/29/2019   Procedure: LEFT HEART CATH AND CORONARY ANGIOGRAPHY;  Surgeon: Dann Candyce RAMAN, MD;  Location: [facility] INVASIVE CV LAB;  Service: Cardiovascular;  Laterality: N/A;   LEFT HEART CATH AND CORONARY ANGIOGRAPHY N/A 10/12/2022    Procedure: LEFT HEART CATH AND CORONARY ANGIOGRAPHY;  Surgeon: Dann Candyce RAMAN, MD;  Location: [facility] INVASIVE CV LAB;  Service: Cardiovascular;  Laterality: N/A;   MALONEY DILATION  05/10/2019   Procedure: AGAPITO DILATION;  Surgeon: Rivet Claudis RAYMOND, MD;  [facility];  Service: Endoscopy;;   MELANOMA EXCISION Left ~ 2016   forearm   POLYPECTOMY  05/10/2019   Procedure: POLYPECTOMY;  Surgeon: Rivet Claudis RAYMOND, MD;  [facility];  Service: Endoscopy;;  colon   POLYPECTOMY  09/01/2022   Procedure: POLYPECTOMY INTESTINAL;  Surgeon: Eartha Angelia Sieving, MD;  [facility];  Service: Gastroenterology;;   SPLENECTOMY, 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   spontaneous rupture   SPLENECTOMY, TOTAL     SUBMUCOSAL LIFTING INJECTION  09/01/2022   Procedure: SUBMUCOSAL LIFTING INJECTION;  Surgeon: Eartha Angelia Sieving, MD;  [facility];  Service: Gastroenterology;;   SUBMUCOSAL TATTOO INJECTION  09/01/2022   Procedure: SUBMUCOSAL TATTOO INJECTION;  Surgeon: Eartha Angelia Sieving, MD;  [facility];  Service: Gastroenterology;;  TEMPORARY PACEMAKER N/A 12/29/2019   Procedure: TEMPORARY PACEMAKER;  Surgeon: Dann Candyce RAMAN, MD;  Location: [facility] INVASIVE CV LAB;  Service: Cardiovascular;  Laterality: N/A;   TRANSURETHRAL RESECTION OF BLADDER TUMOR N/A 03/15/2023   Procedure: TRANSURETHRAL RESECTION OF BLADDER TUMOR (TURBT)with Gemcitibane;  Surgeon: Sherrilee Belvie CROME, MD;  Location: [facility];  Service: Urology;  Laterality: N/A;   TRANSURETHRAL RESECTION OF BLADDER TUMOR N/A 04/29/2023   Procedure: TRANSURETHRAL RESECTION OF BLADDER TUMOR (TURBT);  Surgeon: Sherrilee Belvie CROME, MD;  Location: [facility];  Service: Urology;  Laterality: N/A;   TRANSURETHRAL RESECTION OF BLADDER TUMOR N/A 09/09/2023   Procedure: TURBT (TRANSURETHRAL RESECTION OF BLADDER TUMOR);  Surgeon: Sherrilee Belvie CROME, MD;  Location: [facility];  Service: Urology;  Laterality: N/A;  with  gemcitabine  instillation    Home Medications:  Allergies as of 11/26/2023       Reactions   Gabapentin  Nausea And Vomiting   Other Reaction(s): Not available   Prednisone  Palpitations   Other Reaction(s): Not available   Carvedilol     Stopped due to bradycardia   Clonidine Derivatives    Stopped due to bradycardia   Diltiazem     Stopped due to bradycardia   Tape Other (See Comments)   Causes blisters to form   Latex Other (See Comments), Rash   Blisters Other Reaction(s): Not available        Medication List        Accurate as of November 26, 2023  9:08 AM. If you have any questions, ask your nurse or doctor.          albuterol  108 (90 Base) MCG/ACT inhaler Commonly known as: VENTOLIN  HFA Inhale 1-2 puffs into the lungs every 6 (six) hours as needed for wheezing or shortness of breath.   amLODipine  10 MG tablet Commonly known as: NORVASC  Take 1 tablet (10 mg total) by mouth daily.   aspirin  EC 81 MG tablet Take 81 mg by mouth in the morning.   azaTHIOprine  50 MG tablet Commonly known as: IMURAN  Take 1 tablet (50 mg total) by mouth daily.   busPIRone 5 MG tablet Commonly known as: BUSPAR Take 5 mg by mouth 2 (two) times daily.   cetirizine 10 MG tablet Commonly known as: ZYRTEC Take 10 mg by mouth in the morning.   clopidogrel  75 MG tablet Commonly known as: PLAVIX  Take 75 mg by mouth daily.   ezetimibe  10 MG tablet Commonly known as: ZETIA  Take 1 tablet (10 mg total) by mouth daily.   furosemide  40 MG tablet Commonly known as: LASIX  Take 40 mg by mouth daily as needed for edema.   Gemtesa  75 MG Tabs Generic drug: Vibegron  Take 1 tablet (75 mg total) by mouth daily.   HYDROcodone -acetaminophen  10-325 MG tablet Commonly known as: NORCO Take 1 tablet by mouth every 6 (six) hours as needed for moderate pain (pain score 4-6).   HYDROcodone -acetaminophen  10-325 MG tablet Commonly known as: NORCO Take 1 tablet by mouth every 6 (six) hours as  needed.   Jardiance 10 MG Tabs tablet Generic drug: empagliflozin TAKE (1) TABLET BY MOUTH EACH MORNING.   levothyroxine  50 MCG tablet Commonly known as: SYNTHROID  Take 50 mcg by mouth in the morning.   losartan  50 MG tablet Commonly known as: COZAAR  Take 1 tablet (50 mg total) by mouth daily.   nitrofurantoin  (macrocrystal-monohydrate) 100 MG capsule Commonly known as: MACROBID  Take 1 capsule (100 mg total) by mouth every 12 (twelve) hours.   nitroGLYCERIN  0.4 MG SL tablet  Commonly known as: NITROSTAT  Place 0.4 mg under the tongue every 5 (five) minutes x 3 doses as needed for chest pain.   ondansetron  4 MG disintegrating tablet Commonly known as: ZOFRAN -ODT Take 4 mg by mouth every 8 (eight) hours as needed for nausea or vomiting.   pantoprazole  40 MG tablet Commonly known as: PROTONIX  Take 1 tablet (40 mg total) by mouth daily before breakfast.   rosuvastatin  40 MG tablet Commonly known as: CRESTOR  Take 40 mg by mouth daily.   Spiriva  Respimat 2.5 MCG/ACT Aers Generic drug: Tiotropium Bromide  Monohydrate Inhale 2 puffs into the lungs daily as needed (respiratory issues.).   ursodiol  500 MG tablet Commonly known as: Urso  Forte Take 1 tablet (500 mg total) by mouth 2 (two) times daily.   Vitamin D3 50 MCG (2000 UT) Tabs Take 2,000 Units by mouth in the morning.        Allergies:  Allergies  Allergen Reactions   Gabapentin  Nausea And Vomiting    Other Reaction(s): Not available   Prednisone  Palpitations    Other Reaction(s): Not available   Carvedilol      Stopped due to bradycardia   Clonidine Derivatives     Stopped due to bradycardia    Diltiazem      Stopped due to bradycardia    Tape Other (See Comments)    Causes blisters to form   Latex Other (See Comments) and Rash    Blisters  Other Reaction(s): Not available    Family History: Family History  Problem Relation Age of Onset   Heart failure Mother    Cancer Father    Heart failure  Father    Cancer Sister    Dementia Sister    Heart disease Other    Arthritis Other    Cancer Other    Diabetes Other    Kidney disease Other    Cancer Sister    Heart failure Brother     Social History:  reports that she has been smoking cigarettes. She has a 20 pack-year smoking history. She has been exposed to tobacco smoke. She has never used smokeless tobacco. She reports that she does not currently use alcohol. She reports that she does not use drugs.  ROS: All other review of systems were reviewed and are negative except what is noted above in HPI  Physical Exam: BP (!) 170/73   Pulse 91   Constitutional:  Alert and oriented, No acute distress. HEENT: [place] AT, moist mucus membranes.  Trachea midline, no masses. Cardiovascular: No clubbing, cyanosis, or edema. Respiratory: Normal respiratory effort, no increased work of breathing. GI: Abdomen is soft, nontender, nondistended, no abdominal masses GU: No CVA tenderness.  Lymph: No cervical or inguinal lymphadenopathy. Skin: No rashes, bruises or suspicious lesions. Neurologic: Grossly intact, no focal deficits, moving all 4 extremities. Psychiatric: Normal mood and affect.  Laboratory Data: Lab Results  Component Value Date   WBC 8.2 09/24/2023   HGB 14.1 09/24/2023   HCT 42.7 09/24/2023   MCV 90.3 09/24/2023   PLT 410 (H) 09/24/2023    Lab Results  Component Value Date   CREATININE 0.65 09/24/2023    No results found for: PSA  No results found for: TESTOSTERONE  Lab Results  Component Value Date   HGBA1C 7.1 (H) 10/19/2022    Urinalysis    Component Value Date/Time   COLORURINE RED (A) 09/24/2023 1739   APPEARANCEUR Clear 11/17/2023 1438   LABSPEC  09/24/2023 1739    TEST NOT REPORTED DUE TO  COLOR INTERFERENCE OF URINE PIGMENT   PHURINE  09/24/2023 1739    TEST NOT REPORTED DUE TO COLOR INTERFERENCE OF URINE PIGMENT   GLUCOSEU 3+ (A) 11/17/2023 1438   HGBUR (A) 09/24/2023 1739    TEST NOT  REPORTED DUE TO COLOR INTERFERENCE OF URINE PIGMENT   BILIRUBINUR Negative 11/17/2023 1438   KETONESUR (A) 09/24/2023 1739    TEST NOT REPORTED DUE TO COLOR INTERFERENCE OF URINE PIGMENT   PROTEINUR Negative 11/17/2023 1438   PROTEINUR NEGATIVE 09/24/2023 1739   UROBILINOGEN 1.0 07/01/2023 1121   UROBILINOGEN 0.2 11/03/2014 1722   NITRITE Negative 11/17/2023 1438   NITRITE (A) 09/24/2023 1739    TEST NOT REPORTED DUE TO COLOR INTERFERENCE OF URINE PIGMENT   LEUKOCYTESUR Negative 11/17/2023 1438   LEUKOCYTESUR (A) 09/24/2023 1739    TEST NOT REPORTED DUE TO COLOR INTERFERENCE OF URINE PIGMENT    Lab Results  Component Value Date   LABMICR Comment 11/17/2023   WBCUA 0-5 09/22/2023   LABEPIT 0-10 09/22/2023   BACTERIA RARE (A) 09/24/2023    Pertinent Imaging:  No results found for this or any previous visit.  Results for orders placed during the hospital encounter of 10/05/07  US  VenoUS Imaging Bilateral  Narrative Clinical Data: Left leg pain and swelling  VENOUS DUPLEX ULTRASOUND OF BILATERAL LOWER EXTREMITIES  Technique: Gray-scale sonography with graded compression, as well as color Doppler and duplex ultrasound, were performed to evaluate the deep venous system of both lower extremities from the level of the common femoral vein through the popliteal and proximal calf veins. Spectral Doppler was utilized to evaulate flow at rest and with distal augmentation maneuvers. Confirmed with physician's office that bilateral lower extremity exam requested.  Comparison: None  Findings: Deep venous system patent and compressible from the groin through popliteal fossa bilaterally.SABRA Spontaneous venous flow present with intact augmentation and evidence of respiratory phasicity. No intraluminal thrombus identified. Visualized portions of the greater saphenous systems unremarkable.  IMPRESSION: No evidence of deep venous thrombosis.  Provider: Montie Axe  No  results found for this or any previous visit.  No results found for this or any previous visit.  Results for orders placed during the hospital encounter of 02/02/23  US  Renal  Narrative CLINICAL DATA:  Painless hematuria  EXAM: RENAL / URINARY TRACT ULTRASOUND COMPLETE  COMPARISON:  CT renal stone 01/31/2023  FINDINGS: Right Kidney:  Renal measurements: 12.7 x 4.4 x 5.7 cm = volume: 161 mL. Echogenicity within normal limits. No mass or hydronephrosis visualized.  Left Kidney:  Renal measurements: 11.5 x 6.4 x 5.3 cm = volume: 205 mL. Echogenicity within normal limits. No mass or hydronephrosis visualized.  Bladder:  Under distended and not well evaluated.  Other:  None.  IMPRESSION: Normal renal ultrasound.   Electronically Signed By: Greig Pique M.D. On: 02/02/2023 18:00  No results found for this or any previous visit.  Results for orders placed in visit on 02/03/23  CT HEMATURIA WORKUP  Narrative CLINICAL DATA:  Gross hematuria.  EXAM: CT ABDOMEN AND PELVIS WITHOUT AND WITH CONTRAST  TECHNIQUE: Multidetector CT imaging of the abdomen and pelvis was performed following the standard protocol before and following the bolus administration of intravenous contrast.  RADIATION DOSE REDUCTION: This exam was performed according to the departmental dose-optimization program which includes automated exposure control, adjustment of the mA and/or kV according to patient size and/or use of iterative reconstruction technique.  CONTRAST:  OMNIPAQUE  IOHEXOL  300 MG/ML  SOLN  COMPARISON:  CT  renal stone protocol from 01/31/2023.  FINDINGS: Lower chest: There is a spiculated marginated 1.6 x 2.1 cm nodule in the right lung lower lobe, which was present but partially imaged on the recent prior exam. There is also a new pleural-based 8 x 14 mm nodule in the right lung lower lobe. Findings are concerning for neoplastic process. Further evaluation with  dedicated contrast-enhanced chest CT scan is recommended. The lung bases are otherwise clear. No pleural effusion or consolidation. The heart is normal in size. No pericardial effusion.  Hepatobiliary: The liver is normal in size. Non-cirrhotic configuration. No suspicious mass. No intrahepatic bile duct dilation. There is moderate dilation of the extrahepatic bile duct, most likely due to post cholecystectomy status. Gallbladder is surgically absent.  Pancreas: Unremarkable. No pancreatic ductal dilatation or surrounding inflammatory changes.  Spleen: Surgically absent. There is an accessory splenule in the left upper quadrant. There are also multiple smaller hyperattenuating nodules along the left diaphragmatic undersurface, also favored to represent splenules.  Adrenals/Urinary Tract: Adrenal glands are unremarkable.  Non-contrast images: No radiopaque urinary tract calculi.  Kidneys: Symmetric enhancement. No suspicious renal mass.  Urinary Tract Opacification: Adequate.  Collecting Systems and Ureters: No filling defects, masses, strictures, or areas of abnormal dilatation.  Urinary Bladder: Is decompressed precluding optimal assessment. However, note is made of mild bladder wall mucosal hyperenhancement and subtle perivesical fat stranding, concerning for cystitis. Correlate clinically and with urinalysis. No focal mass or bladder calculi.  Stomach/Bowel: No disproportionate dilation of the small or large bowel loops. No evidence of abnormal bowel wall thickening or inflammatory changes. The appendix was not visualized; however there is no acute inflammatory process in the right lower quadrant.  Vascular/Lymphatic: No ascites or pneumoperitoneum. No abdominal or pelvic lymphadenopathy, by size criteria. No aneurysmal dilation of the major abdominal arteries. There are moderate peripheral atherosclerotic vascular calcifications of the aorta and its  major branches.  Reproductive: The uterus is surgically absent. No large adnexal mass.  Other: There is a tiny fat containing umbilical hernia. The soft tissues and abdominal wall are otherwise unremarkable.  Musculoskeletal: No suspicious osseous lesions. There are mild multilevel degenerative changes in the visualized spine.  IMPRESSION: *No nephroureterolithiasis or obstructive uropathy. No suspicious renal, ureteric or urinary bladder mass. Mild urinary bladder wall mucosal hyperenhancement and subtle perivesical fat stranding, concerning for cystitis. Correlate clinically and with urinalysis. *There is a spiculated marginated 1.6 x 2.1 cm nodule in the right lung lower lobe and an additional pleural-based 8 x 14 mm nodule in the right lung lower lobe, which is new since the recent prior exam. Findings are concerning for neoplastic process. Further evaluation with dedicated contrast-enhanced chest CT scan is recommended, unless recently performed. *Multiple other nonacute observations, as described above.  Aortic Atherosclerosis (ICD10-I70.0).   Electronically Signed By: Ree Molt M.D. On: 02/23/2023 14:38  Results for orders placed during the hospital encounter of 09/24/23  CT Renal Stone Study  Narrative CLINICAL DATA:  TURBT 09/09/2023.  Hematuria.  EXAM: CT ABDOMEN AND PELVIS WITHOUT CONTRAST  TECHNIQUE: Multidetector CT imaging of the abdomen and pelvis was performed following the standard protocol without IV contrast.  RADIATION DOSE REDUCTION: This exam was performed according to the departmental dose-optimization program which includes automated exposure control, adjustment of the mA and/or kV according to patient size and/or use of iterative reconstruction technique.  COMPARISON:  02/13/2023  FINDINGS: Lower chest: The medial right base lung nodule has resolved. The vertical level of the 2 cm right lower lobe  pulmonary nodule shown on the  02/13/2023 exam is not included on today's exam, and accordingly we were not able to determine whether this has resolved or if it represents a lung cancer. Did the patient undergo the recommended chest CT? If not, chest CT at this time is recommended to exclude lung cancer.  Coronary and descending thoracic aortic atherosclerosis.  Hepatobiliary: Nodularity of the liver suggests cirrhosis. Gallbladder absent. Continued mild extrahepatic biliary dilatation.  Pancreas: Unremarkable  Spleen: Splenectomy with regenerative splenic tissue noted.  Adrenals/Urinary Tract: No hydronephrosis or hydroureter. The urinary bladder is empty. Adrenal glands unremarkable. No urinary tract calculi.  Stomach/Bowel: Mild sigmoid colon diverticulosis. 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  Vascular/Lymphatic: Atherosclerosis is present, including aortoiliac atherosclerotic disease. Numerous small and upper normal size retroperitoneal lymph nodes as before.  Reproductive: Uterus absent.  Other: No supplemental non-categorized findings.  Musculoskeletal: Mild grade 1 degenerative anterolisthesis at L3-4. Mild degenerative hip arthropathy bilaterally.  IMPRESSION: 1. The more caudad medial right base lung nodule shown on prior chest CT has resolved. However, vertical level of the 2 cm right lower lobe pulmonary nodule shown on the 02/13/2023 exam is not included on today's exam, and accordingly we were not able to determine whether this has resolved or if it represents a lung cancer. Did the patient undergo the recommended chest CT? If not, chest CT at this time is recommended to exclude lung cancer. 2. Nodularity of the liver suggests cirrhosis. 3. Mild sigmoid colon diverticulosis. 4. Mild grade 1 degenerative anterolisthesis at L3-4. 5. Mild degenerative hip arthropathy bilaterally. 6.  Aortic Atherosclerosis (ICD10-I70.0).   Electronically Signed By: Ryan Salvage M.D. On:  09/24/2023 20:48   Assessment & Plan:    1. Malignant neoplasm of lateral wall of urinary bladder (HCC) (Primary) -continue BCG therapy - Urinalysis, Routine w reflex microscopic   No follow-ups on file.  Belvie Clara, MD  [facility]

## 2023-11-26 NOTE — Progress Notes (Signed)
 BCG Bladder Instillation  BCG # 4 of 6  Due to Bladder Cancer, patient is present today for BCG treatment. The patient was cleaned and prepped in a sterile fashion with Betadinex3 A 14 FR catheter was inserted, urine return was noted 10 ml, urine was Clear yellowin color.  50ml of reconstituted BCG was then instilled into the bladder. The catheter was then removed. Patient tolerated well, no complications were noted The patient given and discussed with BCG education attached to the AVS.   Performed by: Carlos, CMA  Follow up-1week with Urology  Additional comments- continue BCG therapy #5

## 2023-11-28 ENCOUNTER — Other Ambulatory Visit: Payer: Self-pay | Admitting: Cardiology

## 2023-11-29 ENCOUNTER — Encounter: Attending: Physical Medicine & Rehabilitation | Admitting: Physical Medicine & Rehabilitation

## 2023-11-29 ENCOUNTER — Encounter: Payer: Self-pay | Admitting: Physical Medicine & Rehabilitation

## 2023-11-29 VITALS — BP 126/81 | HR 89 | Ht 63.0 in | Wt 184.6 lb

## 2023-11-29 DIAGNOSIS — Z5181 Encounter for therapeutic drug level monitoring: Secondary | ICD-10-CM | POA: Insufficient documentation

## 2023-11-29 DIAGNOSIS — G8929 Other chronic pain: Secondary | ICD-10-CM | POA: Diagnosis present

## 2023-11-29 DIAGNOSIS — M5441 Lumbago with sciatica, right side: Secondary | ICD-10-CM | POA: Insufficient documentation

## 2023-11-29 DIAGNOSIS — G894 Chronic pain syndrome: Secondary | ICD-10-CM | POA: Diagnosis not present

## 2023-11-29 DIAGNOSIS — Z79891 Long term (current) use of opiate analgesic: Secondary | ICD-10-CM | POA: Insufficient documentation

## 2023-11-29 MED ORDER — HYDROCODONE-ACETAMINOPHEN 10-325 MG PO TABS: 1.0000 | ORAL_TABLET | Freq: Four times a day (QID) | ORAL | 0 refills | Status: DC | PRN

## 2023-11-29 MED ORDER — LOSARTAN POTASSIUM 50 MG PO TABS: 50.0000 mg | ORAL_TABLET | Freq: Every day | ORAL | 3 refills | Status: AC

## 2023-11-29 MED ORDER — CLOPIDOGREL BISULFATE 75 MG PO TABS: 75.0000 mg | ORAL_TABLET | Freq: Every day | ORAL | 3 refills | Status: AC

## 2023-11-29 MED ORDER — HYDROCODONE-ACETAMINOPHEN 10-325 MG PO TABS
1.0000 | ORAL_TABLET | Freq: Four times a day (QID) | ORAL | 0 refills | Status: DC | PRN
Start: 2023-11-29 — End: 2024-02-03

## 2023-11-29 MED ORDER — FUROSEMIDE 40 MG PO TABS: 40.0000 mg | ORAL_TABLET | Freq: Every day | ORAL | 3 refills | Status: AC | PRN

## 2023-11-29 MED ORDER — ROSUVASTATIN CALCIUM 40 MG PO TABS: 40.0000 mg | ORAL_TABLET | Freq: Every day | ORAL | 3 refills | Status: AC

## 2023-11-29 NOTE — Progress Notes (Signed)
 Subjective:    Patient ID: [name], female    DOB: 1950/07/31, 73 y.o.   MRN: [id number]  HPI    HPI 01/01/23   [name] is a 73 y.o. year old female  who  has a past medical history of Anxiety, Arthritis, Bradycardia, Breast cancer, left breast (HCC) (1970s; 2015), CAD (coronary artery disease), Cancer of skin of leg, 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HCC), Chronic lower back pain, Chronic pain, COPD (chronic obstructive pulmonary disease) (HCC), Coronary artery disease, Depression, GERD (gastroesophageal reflux disease), Heart murmur, Hepatic cirrhosis (HCC) (12/20/2017), Hepatitis, History of blood transfusion, History of hiatal hernia, History of kidney stones, Hyperlipidemia, Hypertension, Hypothyroidism, Kidney stone, Melanoma of forearm, left (HCC), Migraine, Obesity, Stroke (HCC) (04/2003), Tobacco use disorder, and Wears glasses.   They are presenting to PM&R clinic as a new patient for pain management evaluation. They were referred by Dr. Melvenia for treatment of chronic pain.  Patient reports she has had pain in multiple areas for many years.  Reports she had pain in her neck due to arthritis for many years.  Reports she had previous injections in her neck ESI?  X 3 about 10 years ago that did not significantly reduce her overall pain but did help the burning pain she was having in the area.  She said she was seeing a bone doctor and [name].  She reports she previously had cortisone injections in her shoulders more on the left side.  Shoulder pain is doing better recently.  She reports severe swelling and pain around her first [facility] joint.  She also has pain throughout other digits of her bilateral hands due to arthritis.  Additionally she has pain in her knees primarily on the left side.  This pain is worse with ambulation.  For several months she has been having pain around her left jaw.  Reports some shock type pain over her left face.  She has a lot of popping when she  opens and closes her jaw.  Recently had MRI of her face ordered by neurology, results pending.  She also had an MRI of her brain with results pending by neurology due to left-sided weakness rule out additional CVA.   He has a history of depression and anxiety.  Denies HI or SI.  Reports that she lost her son, dog and niece about 1 and half years ago.   Red flag symptoms: No red flags for back pain endorsed in Hx or ROS   Medications tried: Topical medications - denies  Nsaids - Excedrin  used in the past Tylenol   Helps for a short time  Opiates   Hydrocodone  10mg  for [employer], retired in July , now Dr. Melvenia Gabapentin - Allergy to this medication TCAs  nortriptyline  - helping jaw pain  SNRIs  Duloxetine?- not sure      Other treatments: PT-  PT for TMJ jaw, years ago it made the pain worse    TENs unit - denies  Injections- Shoulder injections Surgery - denies  Other  heating pad helps  Interval History 02/01/23  Patient is here for follow-up regarding her chronic neck pain, left-sided facial pain, polyarthralgia.  She had an MRI face completed in October which showed left TMJ degeneration.  Discontinued nortriptyline  because she did not feel like it was helping.  She continues to have left facial pain however it is doing better than it was during her past visit.  She continues to have a lot of pain in her neck and  base of her left thumb.  He also has pain in her left knee and left shoulder.  She continues to use hydrocodone  to manage her pain, she has used this for years.  She is not have any side effects with the medication.  She reports poor mood, did not tolerate sertraline  because she felt it made her mood worse.  She has discontinued this medication.  She reports that she has follow-up with psychiatry scheduled.  Interval history 04/01/2023 Patient is here for follow-up regarding her chronic neck, lower back, left-sided facial pain and polyarthralgia. Worst pain is in her  lower back and neck.   Since her last visit she has had treatment for bladder cancer. She was ordered oxycodone  after the procedure, however only used half a pill.  She has continued using her hydrocodone  10 mg which has been helping to manage her pain.  She does feel that the hydrocodone  she recently picked up from the pharmacy has a different pill shape than previously.  She feels like this version does not work as well as the prior version.  She denies any side effects to the medication.  She reports she is following up with psychiatry regarding her mood.  Denies SI or HI.  Interval history 05/31/2023 [name] is here regarding her chronic pain.  Chronic neck, lower back, facial pain and pain in multiple joints is largely unchanged from prior visit. She is also having some suprapubic pain related to bladder cancer.  She is currently being treated for her bladder cancer.  She had TURBT by Dr. 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  She is having treatments with BCG bladder instillation. Reports Norco 10 is helping keep her pain under control.  She had been ordered oxycodone  after procedure says she did not use this.  Interval history 07/29/23 Patient reports continued low back pain.  She is generally taking this medication about 3 times a day.  Pain is primarily axial.  She also has pain in her shoulders.  She has pain in her knees left greater than right.  Pain is controlled overall with current dose of Norco.  Denies any side effects with the medication.  She is not taking benzos, was weaned off this by PCP. She reports she completed treatment with chemo for her bladder cancer.  A1c has been up recently. Patient reports her depression/mood is doing better.   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    Interval history 09/28/23 Patient reports she had some additional treatments for bladder cancer.  Last week she had to go to the  hospital for hematuria, this has resolved since yesterday.  She continues to follow-up with urology.  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  Overall she feels like back pain is under control with Norco 10 mg 3-4 times a day.  Back pain is primarily axial worse with activity.  She is not having any side effects with this medication.  Pain medication helps her be active at home and allows her to continue to work at her job.  Interval history 11/29/2023 Reports pain is managed well enough to complete activities of daily living, including housework and stocking shelves at work. Primary complaint is persistent lower back pain, which is worse at night and with lifting. Pain is described as a burning sensation radiating from the low back, around the right hip, and down the back of the right leg. Standing for long periods worsens the pain.  Sitting for 10-15 minutes causes numbness in both legs and feet. Also reports ongoing pain in the neck, shoulders, knees, and hands.  Reports significant claustrophobia and panic attacks with closed MRI machines, but has had them before.   Bladder cancer: Currently undergoing BCG treatments.  Cardiovascular: Two cardiac stents. High blood pressure. Has a nuclear stress test scheduled.  Pain Inventory Average Pain 10 Pain Right Now 6 My pain is constant and sharp  In the last 24 hours, has pain interfered with the following? General activity 7 Relation with others 4 Enjoyment of life 1 What TIME of day is your pain at its worst? night Sleep (in general) Poor  Pain is worse with: walking, bending, and standing Pain improves with: rest and medication Relief from Meds: 10  Family History  Problem Relation Age of Onset   Heart failure Mother    Cancer Father    Heart failure Father    Cancer Sister    Dementia Sister    Heart disease Other    Arthritis Other    Cancer Other    Diabetes Other    Kidney disease Other    Cancer Sister     Heart failure Brother    Social History   Socioeconomic History   Marital status: Divorced    Spouse name: Not on file   Number of children: 2   Years of education: Not on file   Highest education level: Not on file  Occupational History   Occupation: Disabled   Occupation: retired  Tobacco Use   Smoking status: Some Days    Current packs/day: 0.50    Average packs/day: 0.5 packs/day for 40.0 years (20.0 ttl pk-yrs)    Types: Cigarettes    Passive exposure: Current   Smokeless tobacco: Never   Tobacco comments:    Trying to quit with use of Chantix and calm as of 04/13/2023 but restarted in February with 2 cigarettes per week.   Vaping Use   Vaping status: Never Used  Substance and Sexual Activity   Alcohol use: Not Currently   Drug use: Never   Sexual activity: Not Currently  Other Topics Concern   Not on file  Social History Narrative   ** Merged History Encounter **       Right handed   Wears glasses    Drinks 1 cup of coffee daily   Drinks sweet tea twice a week.   Social Drivers of Corporate investment banker Strain: Low Risk  (12/15/2022)   Overall Financial Resource Strain (CARDIA)    Difficulty of Paying Living Expenses: Not hard at all  Food Insecurity: No Food Insecurity (12/15/2022)   Hunger Vital Sign    Worried About Running Out of Food in the Last Year: Never true    Ran Out of Food in the Last Year: Never true  Transportation Needs: No Transportation Needs (12/15/2022)   PRAPARE - Administrator, Civil Service (Medical): No    Lack of Transportation (Non-Medical): No  Physical Activity: Sufficiently Active (12/15/2022)   Exercise Vital Sign    Days of Exercise per Week: 7 days    Minutes of Exercise per Session: 30 min  Stress: Stress Concern Present (12/15/2022)   [employer] of Occupational Health - Occupational Stress Questionnaire    Feeling of Stress : To some extent  Social Connections: Moderately Isolated (12/15/2022)    Social Connection and Isolation Panel    Frequency of Communication with Friends and Family: More  than three times a week    Frequency of Social Gatherings with Friends and Family: More than three times a week    Attends Religious Services: More than 4 times per year    Active Member of Clubs or Organizations: No    Attends Banker Meetings: Never    Marital Status: Divorced   Past Surgical History:  Procedure Laterality Date   ABDOMINAL HYSTERECTOMY  1978   APPENDECTOMY     BIOPSY  04/28/2022   Procedure: BIOPSY;  Surgeon: Eartha Angelia Sieving, MD;  [facility];  Service: Gastroenterology;;   BLADDER INSTILLATION N/A 04/29/2023   Procedure: BLADDER INSTILLATION- gemcitabine ;  Surgeon: Sherrilee Belvie CROME, MD;  [facility]: [facility];  Service: Urology;  Laterality: N/A;   BREAST BIOPSY Left 1970s; 2015   BREAST LUMPECTOMY Left 1970s   BREAST LUMPECTOMY WITH NEEDLE LOCALIZATION Left 07/07/2013   Procedure: BREAST LUMPECTOMY WITH NEEDLE LOCALIZATION;  Surgeon: Deward GORMAN Curvin DOUGLAS, MD;  Location: [facility];  Service: General;  Laterality: Left;   CARDIAC CATHETERIZATION N/A 01/22/2016   Procedure: Left Heart Cath and Coronary Angiography;  Surgeon: Peter M Swaziland, MD;  Location: [facility] INVASIVE CV LAB;  Service: Cardiovascular;  Laterality: N/A;   CARDIAC CATHETERIZATION N/A 01/22/2016   Procedure: Coronary Stent Intervention;  Surgeon: Peter M Swaziland, MD;  Location: [facility] INVASIVE CV LAB;  Service: Cardiovascular;  Laterality: N/A;   CAROTID STENT Left 04/2003   small left parietal and left hemispheric CVA/notes 07/29/2010   CHOLECYSTECTOMY OPEN     COLONOSCOPY  2011   COLONOSCOPY N/A 05/10/2019   Procedure: COLONOSCOPY;  Surgeon: Golda Claudis PENNER, MD;  [facility];  Service: Endoscopy;  Laterality: N/A;  1:45   COLONOSCOPY WITH PROPOFOL  N/A 09/01/2022   Procedure: COLONOSCOPY WITH PROPOFOL ;  Surgeon: Eartha Angelia Sieving, MD;  [facility];  Service: Gastroenterology;  Laterality: N/A;  1:45PM;ASA 3   CORONARY ATHERECTOMY N/A 12/29/2019   Procedure: CORONARY ATHERECTOMY;  Surgeon: Dann Candyce GORMAN, MD;  Location: [facility] INVASIVE CV LAB;  Service: Cardiovascular;  Laterality: N/A;  Prox RCA   CORONARY STENT INTERVENTION N/A 12/29/2019   Procedure: CORONARY STENT INTERVENTION;  Surgeon: Dann Candyce GORMAN, MD;  Location: [facility] INVASIVE CV LAB;  Service: Cardiovascular;  Laterality: N/A;  Prox RCA   CORONARY ULTRASOUND/IVUS N/A 12/29/2019   Procedure: Intravascular Ultrasound/IVUS;  Surgeon: Dann Candyce GORMAN, MD;  Location: [facility] INVASIVE CV LAB;  Service: Cardiovascular;  Laterality: N/A;   CYSTOSCOPY WITH BIOPSY N/A 04/29/2023   Procedure: CYSTOSCOPY WITH BIOPSY;  Surgeon: Sherrilee Belvie CROME, MD;  [facility]: [facility];  Service: Urology;  Laterality: N/A;   ESOPHAGEAL DILATION N/A 04/28/2022   Procedure: ESOPHAGEAL DILATION;  Surgeon: Eartha Angelia Sieving, MD;  [facility];  Service: Gastroenterology;  Laterality: N/A;   ESOPHAGOGASTRODUODENOSCOPY  08/06/2011   Procedure: ESOPHAGOGASTRODUODENOSCOPY (EGD);  Surgeon: Claudis PENNER Golda, MD;  [facility];  Service: Endoscopy;  Laterality: N/A;   ESOPHAGOGASTRODUODENOSCOPY N/A 05/10/2019   Procedure: ESOPHAGOGASTRODUODENOSCOPY (EGD);  Surgeon: Golda Claudis PENNER, MD;  [facility];  Service: Endoscopy;  Laterality: N/A;   ESOPHAGOGASTRODUODENOSCOPY (EGD) WITH PROPOFOL  N/A 04/28/2022   Procedure: ESOPHAGOGASTRODUODENOSCOPY (EGD) WITH PROPOFOL ;  Surgeon: Eartha Angelia Sieving, MD;  [facility];  Service: Gastroenterology;  Laterality: N/A;  10:30 am, pt can't move up due to transportation   FLEXIBLE SIGMOIDOSCOPY  08/06/2011   Procedure: FLEXIBLE SIGMOIDOSCOPY;  Surgeon: Claudis PENNER Golda, MD;  [facility];  Service: Endoscopy;  Laterality: N/A;   HEMOSTASIS CLIP PLACEMENT  09/01/2022   Procedure: HEMOSTASIS CLIP  PLACEMENT;  Surgeon: Eartha Angelia Sieving, MD;  [facility];  Service: Gastroenterology;;   INGUINAL HERNIA REPAIR Left 1970s   IR ANGIO INTRA EXTRACRAN SEL COM CAROTID INNOMINATE BILAT MOD SED  11/01/2020   IR ANGIO VERTEBRAL SEL VERTEBRAL BILAT MOD SED  11/01/2020   IR RADIOLOGIST EVAL & MGMT  09/23/2020   IR US  GUIDE VASC ACCESS RIGHT  11/01/2020   LARYNX SURGERY  1970s   Polyps excised   LEFT HEART CATH AND CORONARY ANGIOGRAPHY N/A 12/29/2019   Procedure: LEFT HEART CATH AND CORONARY ANGIOGRAPHY;  Surgeon: Dann Candyce RAMAN, MD;  Location: [facility] INVASIVE CV LAB;  Service: Cardiovascular;  Laterality: N/A;   LEFT HEART CATH AND CORONARY ANGIOGRAPHY N/A 10/12/2022   Procedure: LEFT HEART CATH AND CORONARY ANGIOGRAPHY;  Surgeon: Dann Candyce RAMAN, MD;  Location: [facility] INVASIVE CV LAB;  Service: Cardiovascular;  Laterality: N/A;   MALONEY DILATION  05/10/2019   Procedure: AGAPITO DILATION;  Surgeon: Golda Claudis PENNER, MD;  [facility];  Service: Endoscopy;;   MELANOMA EXCISION Left ~ 2016   forearm   POLYPECTOMY  05/10/2019   Procedure: POLYPECTOMY;  Surgeon: Golda Claudis PENNER, MD;  [facility];  Service: Endoscopy;;  colon   POLYPECTOMY  09/01/2022   Procedure: POLYPECTOMY INTESTINAL;  Surgeon: Eartha Angelia Sieving, MD;  [facility];  Service: Gastroenterology;;   SPLENECTOMY, 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   spontaneous rupture   SPLENECTOMY, TOTAL     SUBMUCOSAL LIFTING INJECTION  09/01/2022   Procedure: SUBMUCOSAL LIFTING INJECTION;  Surgeon: Eartha Angelia Sieving, MD;  [facility];  Service: Gastroenterology;;   SUBMUCOSAL TATTOO INJECTION  09/01/2022   Procedure: SUBMUCOSAL TATTOO INJECTION;  Surgeon: Eartha Angelia Sieving, MD;  [facility];  Service: Gastroenterology;;   TEMPORARY PACEMAKER N/A 12/29/2019   Procedure: TEMPORARY PACEMAKER;  Surgeon: Dann Candyce RAMAN, MD;  Location: [facility] INVASIVE CV LAB;   Service: Cardiovascular;  Laterality: N/A;   TRANSURETHRAL RESECTION OF BLADDER TUMOR N/A 03/15/2023   Procedure: TRANSURETHRAL RESECTION OF BLADDER TUMOR (TURBT)with Gemcitibane;  Surgeon: Sherrilee Belvie CROME, MD;  [facility];  Service: Urology;  Laterality: N/A;   TRANSURETHRAL RESECTION OF BLADDER TUMOR N/A 04/29/2023   Procedure: TRANSURETHRAL RESECTION OF BLADDER TUMOR (TURBT);  Surgeon: Sherrilee Belvie CROME, MD;  [facility];  Service: Urology;  Laterality: N/A;   TRANSURETHRAL RESECTION OF BLADDER TUMOR N/A 09/09/2023   Procedure: TURBT (TRANSURETHRAL RESECTION OF BLADDER TUMOR);  Surgeon: Sherrilee Belvie CROME, MD;  Location: [facility];  Service: Urology;  Laterality: N/A;  with gemcitabine  instillation   Past Surgical History:  Procedure Laterality Date   ABDOMINAL HYSTERECTOMY  1978   APPENDECTOMY     BIOPSY  04/28/2022   Procedure: BIOPSY;  Surgeon: Eartha Angelia Sieving, MD;  [facility];  Service: Gastroenterology;;   BLADDER INSTILLATION N/A 04/29/2023   Procedure: BLADDER INSTILLATION- gemcitabine ;  Surgeon: Sherrilee Belvie CROME, MD;  [facility];  Service: Urology;  Laterality: N/A;   BREAST BIOPSY Left 1970s; 2015   BREAST LUMPECTOMY Left 1970s   BREAST LUMPECTOMY WITH NEEDLE LOCALIZATION Left 07/07/2013   Procedure: BREAST LUMPECTOMY WITH NEEDLE LOCALIZATION;  Surgeon: Deward RAMAN Curvin DOUGLAS, MD;  Location: [facility];  Service: General;  Laterality: Left;   CARDIAC CATHETERIZATION N/A 01/22/2016   Procedure: Left Heart Cath and Coronary Angiography;  Surgeon:  Peter M Swaziland, MD;  Location: [facility] INVASIVE CV LAB;  Service: Cardiovascular;  Laterality: N/A;   CARDIAC CATHETERIZATION N/A 01/22/2016   Procedure: Coronary Stent Intervention;  Surgeon: Peter M Swaziland, MD;  Location: [facility] INVASIVE CV LAB;  Service: Cardiovascular;  Laterality: N/A;   CAROTID STENT Left 04/2003   small left parietal and left hemispheric CVA/notes 07/29/2010    CHOLECYSTECTOMY OPEN     COLONOSCOPY  2011   COLONOSCOPY N/A 05/10/2019   Procedure: COLONOSCOPY;  Surgeon: Golda Claudis PENNER, MD;  [facility];  Service: Endoscopy;  Laterality: N/A;  1:45   COLONOSCOPY WITH PROPOFOL  N/A 09/01/2022   Procedure: COLONOSCOPY WITH PROPOFOL ;  Surgeon: Eartha Angelia Sieving, MD;  [facility];  Service: Gastroenterology;  Laterality: N/A;  1:45PM;ASA 3   CORONARY ATHERECTOMY N/A 12/29/2019   Procedure: CORONARY ATHERECTOMY;  Surgeon: Dann Candyce RAMAN, MD;  Location: [facility] INVASIVE CV LAB;  Service: Cardiovascular;  Laterality: N/A;  Prox RCA   CORONARY STENT INTERVENTION N/A 12/29/2019   Procedure: CORONARY STENT INTERVENTION;  Surgeon: Dann Candyce RAMAN, MD;  Location: [facility] INVASIVE CV LAB;  Service: Cardiovascular;  Laterality: N/A;  Prox RCA   CORONARY ULTRASOUND/IVUS N/A 12/29/2019   Procedure: Intravascular Ultrasound/IVUS;  Surgeon: Dann Candyce RAMAN, MD;  Location: [facility] INVASIVE CV LAB;  Service: Cardiovascular;  Laterality: N/A;   CYSTOSCOPY WITH BIOPSY N/A 04/29/2023   Procedure: CYSTOSCOPY WITH BIOPSY;  Surgeon: Sherrilee Belvie CROME, MD;  [facility];  Service: Urology;  Laterality: N/A;   ESOPHAGEAL DILATION N/A 04/28/2022   Procedure: ESOPHAGEAL DILATION;  Surgeon: Eartha Angelia Sieving, MD;  [facility];  Service: Gastroenterology;  Laterality: N/A;   ESOPHAGOGASTRODUODENOSCOPY  08/06/2011   Procedure: ESOPHAGOGASTRODUODENOSCOPY (EGD);  Surgeon: Claudis PENNER Golda, MD;  [facility];  Service: Endoscopy;  Laterality: N/A;   ESOPHAGOGASTRODUODENOSCOPY N/A 05/10/2019   Procedure: ESOPHAGOGASTRODUODENOSCOPY (EGD);  Surgeon: Golda Claudis PENNER, MD;  [facility];  Service: Endoscopy;  Laterality: N/A;   ESOPHAGOGASTRODUODENOSCOPY (EGD) WITH PROPOFOL  N/A 04/28/2022   Procedure: ESOPHAGOGASTRODUODENOSCOPY (EGD) WITH PROPOFOL ;  Surgeon: Eartha Angelia Sieving, MD;  [facility];   Service: Gastroenterology;  Laterality: N/A;  10:30 am, pt can't move up due to transportation   FLEXIBLE SIGMOIDOSCOPY  08/06/2011   Procedure: FLEXIBLE SIGMOIDOSCOPY;  Surgeon: Claudis PENNER Golda, MD;  [facility];  Service: Endoscopy;  Laterality: N/A;   HEMOSTASIS CLIP PLACEMENT  09/01/2022   Procedure: HEMOSTASIS CLIP PLACEMENT;  Surgeon: Eartha Angelia Sieving, MD;  [facility];  Service: Gastroenterology;;   INGUINAL HERNIA REPAIR Left 1970s   IR ANGIO INTRA EXTRACRAN SEL COM CAROTID INNOMINATE BILAT MOD SED  11/01/2020   IR ANGIO VERTEBRAL SEL VERTEBRAL BILAT MOD SED  11/01/2020   IR RADIOLOGIST EVAL & MGMT  09/23/2020   IR US  GUIDE VASC ACCESS RIGHT  11/01/2020   LARYNX SURGERY  1970s   Polyps excised   LEFT HEART CATH AND CORONARY ANGIOGRAPHY N/A 12/29/2019   Procedure: LEFT HEART CATH AND CORONARY ANGIOGRAPHY;  Surgeon: Dann Candyce RAMAN, MD;  Location: [facility];  Service: Cardiovascular;  Laterality: N/A;   LEFT HEART CATH AND CORONARY ANGIOGRAPHY N/A 10/12/2022   Procedure: LEFT HEART CATH AND CORONARY ANGIOGRAPHY;  Surgeon: Dann Candyce RAMAN, MD;  Location: [facility] INVASIVE CV LAB;  Service: Cardiovascular;  Laterality: N/A;   MALONEY DILATION  05/10/2019   Procedure: AGAPITO DILATION;  Surgeon: Golda Claudis PENNER, MD;  [facility];  Service:  Endoscopy;;   MELANOMA EXCISION Left ~ 2016   forearm   POLYPECTOMY  05/10/2019   Procedure: POLYPECTOMY;  Surgeon: Golda Claudis PENNER, MD;  [facility];  Service: Endoscopy;;  colon   POLYPECTOMY  09/01/2022   Procedure: POLYPECTOMY INTESTINAL;  Surgeon: Eartha Angelia Sieving, MD;  [facility];  Service: Gastroenterology;;   SPLENECTOMY, 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   spontaneous rupture   SPLENECTOMY, TOTAL     SUBMUCOSAL LIFTING INJECTION  09/01/2022   Procedure: SUBMUCOSAL LIFTING INJECTION;  Surgeon: Eartha Angelia Sieving, MD;  [facility];  Service:  Gastroenterology;;   SUBMUCOSAL TATTOO INJECTION  09/01/2022   Procedure: SUBMUCOSAL TATTOO INJECTION;  Surgeon: Eartha Angelia Sieving, MD;  [facility];  Service: Gastroenterology;;   TEMPORARY PACEMAKER N/A 12/29/2019   Procedure: TEMPORARY PACEMAKER;  Surgeon: Dann Candyce RAMAN, MD;  Location: [facility] INVASIVE CV LAB;  Service: Cardiovascular;  Laterality: N/A;   TRANSURETHRAL RESECTION OF BLADDER TUMOR N/A 03/15/2023   Procedure: TRANSURETHRAL RESECTION OF BLADDER TUMOR (TURBT)with Gemcitibane;  Surgeon: Sherrilee Belvie CROME, MD;  [facility];  Service: Urology;  Laterality: N/A;   TRANSURETHRAL RESECTION OF BLADDER TUMOR N/A 04/29/2023   Procedure: TRANSURETHRAL RESECTION OF BLADDER TUMOR (TURBT);  Surgeon: Sherrilee Belvie CROME, MD;  [facility];  Service: Urology;  Laterality: N/A;   TRANSURETHRAL RESECTION OF BLADDER TUMOR N/A 09/09/2023   Procedure: TURBT (TRANSURETHRAL RESECTION OF BLADDER TUMOR);  Surgeon: Sherrilee Belvie CROME, MD;  Location: [facility];  Service: Urology;  Laterality: N/A;  with gemcitabine  instillation   Past Medical History:  Diagnosis Date   Anxiety    Arthritis    hands, arms, back, neck, knees, fingers (01/21/2016)   Bradycardia    Breast cancer, left breast (HCC) 1970s; 2015   CAD (coronary artery disease)    a. s/p NSTEMI in 2017 with DES to OM and unsuccessful PCI to RCA with wire dissection b. 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   History of hiatal hernia    History of kidney stones    Hyperlipidemia    Hypertension    Hypothyroidism    Long term prescription benzodiazepine  use 12/10/2022   Melanoma of forearm, left (HCC)    Migraine    a few migraines/year (01/21/2016)   Obesity    Polypharmacy 12/10/2022   Stroke (HCC) 04/2003   2 carotid artery stents in place; small left parietal and left hemispheric CVA.hazeline 01/21/2016   Tobacco use disorder    50 pack years; questionably discontinued in 2010   Wears glasses    BP (!) 157/79   Pulse 89   Ht 5' 3 (1.6 m)   Wt 184 lb 9.6 oz (83.7 kg)   SpO2 93%   BMI 32.70 kg/m   Opioid Risk Score:   Fall Risk Score:  `1  Depression screen [facility] 2/9     11/29/2023   11:35 AM 09/28/2023   10:18 AM 07/29/2023   10:56 AM 05/31/2023   10:17 AM  04/01/2023   12:48 PM 02/01/2023    9:35 AM 01/21/2023    9:33 AM  Depression screen PHQ 2/9  Decreased Interest 1 1 0 0 0 3 1  Down, Depressed, Hopeless 1 1 0 0 0 3 3  PHQ - 2 Score 2 2 0 0 0 6 4  Altered sleeping       3  Tired, decreased energy       3  Change in appetite       1  Feeling bad or failure about yourself        0  Trouble concentrating       0  Moving slowly or fidgety/restless       0  PHQ-9 Score       11  Difficult doing work/chores       Somewhat difficult     Review of Systems  Constitutional: Negative.   HENT: Negative.    Eyes: Negative.   Respiratory: Negative.    Cardiovascular: Negative.   Gastrointestinal: Negative.   Endocrine: Negative.   Genitourinary: Negative.   Musculoskeletal:  Positive for arthralgias and back pain.       Pain in both knees, both hands  Skin: Negative.   Allergic/Immunologic: Negative.   Neurological: Negative.   Hematological: Negative.   Psychiatric/Behavioral:  Positive for dysphoric mood.   All other systems reviewed and are negative.      Objective:   Physical Exam   Gen: no distress, normal appearing HEENT: oral mucosa pink and moist, NCAT Chest: normal effort, normal rate of breathing Abd: soft, non-distended Ext: no edema Psych: pleasant, normal affect Skin: intact Neuro: Alert and  oriented x3, follows simple commands, fluent speech, comprehension intact Strength 5 out of 5 in right upper and right lower extremity Strength 4 out of 5 in left upper extremity and left lower extremity  MSK: On examination, tenderness noted over the R lateral lumbar region Tenderness also elicited over the right hip region. Hip ROM: Internal and external rotation of the hips do not elicit pain. Straight leg raise results in pain in the right posterior thigh Reports tingling sensation in the right lower leg upon palpation, with intact but altered sensation. No pain elicited on palpation of the left back or hip.   Prior exam Decreased/alerted sensation L face Facial movements intact b/l Strength 5/5 RUE and RLE Strength 4/5 LUE and LLE Musculoskeletal:  Arthritic changes in b/l hands noted L knee tenderness to palpation Mild B/L hand tenderness  L 1st CMC pain with palpation and PROM, mild swelling around this joint + L TMJ tenderness and clicking  + TTP L spine and C spine paraspinal muscles + trapezius tenderness to palpation, Mild L shoulder tenderness Spurling's- neck pain resulted  Slump test negative    Xray R hand  2020 Right hand x-rays   Right wrist pain   No fracture dislocation or malalignment joint spaces seem preserved   Impression normal x-ray of the right hand   Xray L hand  2020  Left hand pain swelling numbness tingling   AP lateral oblique of the left hand no fracture dislocation or bony arthritis is seen.  Alignment looks normal.   Impression normal left hand x-ray   L knee Xray  12/25/21 On my read appears to show evidence  mild OA     C spine xary 2017 FINDINGS: Bones are osteopenic. Advanced spondylosis and degenerative disc disease at C5-6 with disc space loss, sclerosis and large osteophytes  mild degenerative changes at other levels. Mild diffuse facet arthropathy. Facets appear aligned. Foraminal encroachment suspected of the left C5-6  foramen related to bony spurring. Trachea is midline. Odontoid is intact.   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   No acute osseous finding by plain radiography.       CT C spine 2016 IMPRESSION: 1. Cervical spine degenerative changes. 2. Straightening of the normal cervical lordosis. 3. 9 mm right lobe thyroid  nodule, too small to characterize, but most likely benign in the absence of known clinical risk factors for thyroid  carcinoma.   MRI Face and brain 12/23/22 Brain: No evidence of acute infarct, intracranial hemorrhage, mass, midline shift or extra-axial fluid collection. No pathologic enhancement. Mild for age chronic microvascular ischemic change.   Trigeminal nerves: No mass is identified along the course of the trigeminal nerves on thin-slice imaging. No evidence of neurovascular compression.   Vascular: Major arterial flow voids are maintained at the skull base.   Sinuses/Orbits: Clear sinuses. No acute or significant orbital findings.   Soft tissues: Unremarkable.  No visible edema or mass lesion.   Osseous: Normal marrow signal without focal lesion. TMJs are located. Left TMJ degenerative change.   Other: None.   IMPRESSION: 1. No evidence of acute abnormality intracranially or in the face. 2. Left TMJ degenerative change.   '  02/01/23 Xray thumb left FINDINGS: Bones are osteopenic. Normal alignment without acute osseous finding or fracture. Moderate degenerative arthropathy of the left first CMC joint, first MCP joint, and the thumb interphalangeal joint. Soft tissues unremarkable.   IMPRESSION: Osteopenia and degenerative changes as above. No acute finding by plain radiography.   Assessment & Plan:   1) Chronic neck pain with prior imaging C spine spondylosis 2) Polyarthralgia with L knee pain, L shoulder pain, b/l hand pain.   -She is also having pain in her  right knee more recently. 3) L sided facial pain likely due to TMJ pain.             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  #120 ordered -Patient discontinued nortriptyline  since her last visit she did not feel like this was helping -Continue UDS and pill counts.  Continue PDMP monitoring.  Pain contract completed prior visit. -Pill counts consistent  -Patient reports she is seeing psychiatry-has been weaning clorazepate , last prescription was 04/14/2023 by PDMP.  She reports nonbenzodiazepine medications are helping.  Patient reports her mood has overall been stable. -Consider Cymbalta -MRI of the lumbar spine ordered to evaluate for nerve impingement. Will be scheduled at an [facility].

## 2023-11-30 ENCOUNTER — Other Ambulatory Visit: Payer: Self-pay | Admitting: Physical Medicine & Rehabilitation

## 2023-12-02 LAB — DRUG TOX MONITOR 1 W/CONF, ORAL FLD
AMINOCLONAZEPAM: NEGATIVE ng/mL (ref <0.50)
Alprazolam: NEGATIVE ng/mL (ref <0.50)
Amphetamines: NEGATIVE ng/mL (ref <10)
Barbiturates: NEGATIVE ng/mL (ref <10)
Benzodiazepines: POSITIVE ng/mL — AB (ref <0.50)
Buprenorphine: NEGATIVE ng/mL (ref <0.10)
Chlordiazepoxide: NEGATIVE ng/mL (ref <0.50)
Clonazepam: NEGATIVE ng/mL (ref <0.50)
Cocaine: NEGATIVE ng/mL (ref <5.0)
Codeine: NEGATIVE ng/mL (ref <2.5)
Cotinine: 69.8 ng/mL — ABNORMAL HIGH (ref <5.0)
Diazepam: NEGATIVE ng/mL (ref <0.50)
Dihydrocodeine: 13.3 ng/mL — ABNORMAL HIGH (ref <2.5)
Fentanyl: NEGATIVE ng/mL (ref <0.10)
Flunitrazepam: NEGATIVE ng/mL (ref <0.50)
Flurazepam: NEGATIVE ng/mL (ref <0.50)
Heroin Metabolite: NEGATIVE ng/mL (ref <1.0)
Hydrocodone: >250 ng/mL — ABNORMAL HIGH (ref <2.5)
Hydromorphone: NEGATIVE ng/mL (ref <2.5)
Lorazepam: NEGATIVE ng/mL (ref <0.50)
MARIJUANA: NEGATIVE ng/mL (ref <2.5)
MDMA: NEGATIVE ng/mL (ref <10)
Meprobamate: NEGATIVE ng/mL (ref <2.5)
Methadone: NEGATIVE ng/mL (ref <5.0)
Midazolam: NEGATIVE ng/mL (ref <0.50)
Morphine: NEGATIVE ng/mL (ref <2.5)
Nicotine Metabolite: POSITIVE ng/mL — AB (ref <5.0)
Nordiazepam: 1.97 ng/mL — ABNORMAL HIGH (ref <0.50)
Norhydrocodone: 6 ng/mL — ABNORMAL HIGH (ref <2.5)
Noroxycodone: NEGATIVE ng/mL (ref <2.5)
Opiates: POSITIVE ng/mL — AB (ref <2.5)
Oxazepam: NEGATIVE ng/mL (ref <0.50)
Oxycodone: NEGATIVE ng/mL (ref <2.5)
Oxymorphone: NEGATIVE ng/mL (ref <2.5)
Phencyclidine: NEGATIVE ng/mL (ref <10)
Tapentadol: NEGATIVE ng/mL (ref <5.0)
Temazepam: NEGATIVE ng/mL (ref <0.50)
Tramadol: NEGATIVE ng/mL (ref <5.0)
Triazolam: NEGATIVE ng/mL (ref <0.50)
Zolpidem: NEGATIVE ng/mL (ref <5.0)

## 2023-12-02 LAB — DRUG TOX ALC METAB W/CON, ORAL FLD: Alcohol Metabolite: NEGATIVE ng/mL (ref <25)

## 2023-12-03 ENCOUNTER — Ambulatory Visit: Admitting: Urology

## 2023-12-03 ENCOUNTER — Encounter: Payer: Self-pay | Admitting: Urology

## 2023-12-03 VITALS — BP 167/78 | HR 84

## 2023-12-03 DIAGNOSIS — C672 Malignant neoplasm of lateral wall of bladder: Secondary | ICD-10-CM | POA: Diagnosis not present

## 2023-12-03 LAB — URINALYSIS, ROUTINE W REFLEX MICROSCOPIC
Bilirubin, UA: NEGATIVE
Ketones, UA: NEGATIVE
Leukocytes,UA: NEGATIVE
Nitrite, UA: NEGATIVE
Protein,UA: NEGATIVE
Specific Gravity, UA: 1.01 (ref 1.005–1.030)
Urobilinogen, Ur: 0.2 mg/dL (ref 0.2–1.0)
pH, UA: 6 (ref 5.0–7.5)

## 2023-12-03 MED ORDER — BCG LIVE 50 MG IS SUSR
3.2400 mL | Freq: Once | INTRAVESICAL | Status: AC
  Administered 2023-12-03: 81 mg via INTRAVESICAL

## 2023-12-03 NOTE — Progress Notes (Signed)
 12/03/2023 9:29 AM   Orlean DELENA Arrow 23-Jun-1950 [id number]  Referring provider: Shona Norleen PEDLAR, MD [address] [name],  [place] [address]  No chief complaint on file.   HPI: Ms Harr is a 72yo here for followup for bladder cancer. She did BCG 5/6 today. NO significant LUTS. She has mild intermittent suprapubi pain. No hematuria    PMH: Past Medical History:  Diagnosis Date   Anxiety    Arthritis    hands, arms, back, neck, knees, fingers (01/21/2016)   Bradycardia    Breast cancer, left breast (HCC) 1970s; 2015   CAD (coronary artery disease)    a. s/p NSTEMI in 2017 with DES to OM and unsuccessful PCI to RCA with wire dissection b. 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   History of hiatal hernia    History of kidney stones    Hyperlipidemia    Hypertension    Hypothyroidism    Long term prescription benzodiazepine use 12/10/2022   Melanoma of forearm, left (HCC)    Migraine    a few migraines/year (01/21/2016)   Obesity    Polypharmacy 12/10/2022   Stroke (HCC) 04/2003   2 carotid artery stents in place; small left parietal and left hemispheric CVA.hazeline 01/21/2016   Tobacco use disorder    50 pack years; questionably discontinued in 2010   Wears glasses     Surgical History: Past Surgical History:  Procedure Laterality Date   ABDOMINAL HYSTERECTOMY  1978   APPENDECTOMY     BIOPSY  04/28/2022   Procedure: BIOPSY;  Surgeon: Eartha Angelia Sieving, MD;  [facility];  Service: Gastroenterology;;   BLADDER INSTILLATION N/A 04/29/2023   Procedure: BLADDER INSTILLATION- gemcitabine ;   Surgeon: Sherrilee Belvie CROME, MD;  Location: [facility];  Service: Urology;  Laterality: N/A;   BREAST BIOPSY Left 1970s; 2015   BREAST LUMPECTOMY Left 1970s   BREAST LUMPECTOMY WITH NEEDLE LOCALIZATION Left 07/07/2013   Procedure: BREAST LUMPECTOMY WITH NEEDLE LOCALIZATION;  Surgeon: Deward GORMAN Curvin DOUGLAS, MD;  Location: [facility];  Service: General;  Laterality: Left;   CARDIAC CATHETERIZATION N/A 01/22/2016   Procedure: Left Heart Cath and Coronary Angiography;  Surgeon: Peter M Swaziland, MD;  Location: [facility] INVASIVE CV LAB;  Service: Cardiovascular;  Laterality: N/A;   CARDIAC CATHETERIZATION N/A 01/22/2016   Procedure: Coronary Stent Intervention;  Surgeon: Peter M Swaziland, MD;  Location: [facility] INVASIVE CV LAB;  Service: Cardiovascular;  Laterality: N/A;   CAROTID STENT Left 04/2003   small left parietal and left hemispheric CVA/notes 07/29/2010   CHOLECYSTECTOMY OPEN     COLONOSCOPY  2011   COLONOSCOPY N/A 05/10/2019   Procedure: COLONOSCOPY;  Surgeon: Golda Claudis PENNER, MD;  [facility];  Service: Endoscopy;  Laterality: N/A;  1:45   COLONOSCOPY WITH PROPOFOL  N/A 09/01/2022   Procedure: COLONOSCOPY WITH PROPOFOL ;  Surgeon: Eartha Angelia Sieving, MD;  [facility];  Service: Gastroenterology;  Laterality: N/A;  1:45PM;ASA 3   CORONARY  ATHERECTOMY N/A 12/29/2019   Procedure: CORONARY ATHERECTOMY;  Surgeon: Dann Candyce RAMAN, MD;  Location: [facility] INVASIVE CV LAB;  Service: Cardiovascular;  Laterality: N/A;  Prox RCA   CORONARY STENT INTERVENTION N/A 12/29/2019   Procedure: CORONARY STENT INTERVENTION;  Surgeon: Dann Candyce RAMAN, MD;  Location: [facility] INVASIVE CV LAB;  Service: Cardiovascular;  Laterality: N/A;  Prox RCA   CORONARY ULTRASOUND/IVUS N/A 12/29/2019   Procedure: Intravascular Ultrasound/IVUS;  Surgeon: Dann Candyce RAMAN, MD;  Location: [facility] INVASIVE CV LAB;  Service: Cardiovascular;  Laterality: N/A;   CYSTOSCOPY WITH BIOPSY N/A 04/29/2023   Procedure:  CYSTOSCOPY WITH BIOPSY;  Surgeon: Sherrilee Belvie CROME, MD;  Location: [facility];  Service: Urology;  Laterality: N/A;   ESOPHAGEAL DILATION N/A 04/28/2022   Procedure: ESOPHAGEAL DILATION;  Surgeon: Eartha Angelia Sieving, MD;  [facility];  Service: Gastroenterology;  Laterality: N/A;   ESOPHAGOGASTRODUODENOSCOPY  08/06/2011   Procedure: ESOPHAGOGASTRODUODENOSCOPY (EGD);  Surgeon: Claudis RAYMOND Rivet, MD;  [facility];  Service: Endoscopy;  Laterality: N/A;   ESOPHAGOGASTRODUODENOSCOPY N/A 05/10/2019   Procedure: ESOPHAGOGASTRODUODENOSCOPY (EGD);  Surgeon: Rivet Claudis RAYMOND, MD;  [facility];  Service: Endoscopy;  Laterality: N/A;   ESOPHAGOGASTRODUODENOSCOPY (EGD) WITH PROPOFOL  N/A 04/28/2022   Procedure: ESOPHAGOGASTRODUODENOSCOPY (EGD) WITH PROPOFOL ;  Surgeon: Eartha Angelia Sieving, MD;  [facility];  Service: Gastroenterology;  Laterality: N/A;  10:30 am, pt can't move up due to transportation   FLEXIBLE SIGMOIDOSCOPY  08/06/2011   Procedure: FLEXIBLE SIGMOIDOSCOPY;  Surgeon: Claudis RAYMOND Rivet, MD;  [facility];  Service: Endoscopy;  Laterality: N/A;   HEMOSTASIS CLIP PLACEMENT  09/01/2022   Procedure: HEMOSTASIS CLIP PLACEMENT;  Surgeon: Eartha Angelia Sieving, MD;  [facility];  Service: Gastroenterology;;   INGUINAL HERNIA REPAIR Left 1970s   IR ANGIO INTRA EXTRACRAN SEL COM CAROTID INNOMINATE BILAT MOD SED  11/01/2020   IR ANGIO VERTEBRAL SEL VERTEBRAL BILAT MOD SED  11/01/2020   IR RADIOLOGIST EVAL & MGMT  09/23/2020   IR US  GUIDE VASC ACCESS RIGHT  11/01/2020   LARYNX SURGERY  1970s   Polyps excised   LEFT HEART CATH AND CORONARY ANGIOGRAPHY N/A 12/29/2019   Procedure: LEFT HEART CATH AND CORONARY ANGIOGRAPHY;  Surgeon: Dann Candyce RAMAN, MD;  Location: [facility] INVASIVE CV LAB;  Service: Cardiovascular;  Laterality: N/A;   LEFT HEART CATH AND CORONARY ANGIOGRAPHY N/A 10/12/2022   Procedure: LEFT HEART CATH AND  CORONARY ANGIOGRAPHY;  Surgeon: Dann Candyce RAMAN, MD;  Location: [facility] INVASIVE CV LAB;  Service: Cardiovascular;  Laterality: N/A;   MALONEY DILATION  05/10/2019   Procedure: AGAPITO DILATION;  Surgeon: Rivet Claudis RAYMOND, MD;  [facility];  Service: Endoscopy;;   MELANOMA EXCISION Left ~ 2016   forearm   POLYPECTOMY  05/10/2019   Procedure: POLYPECTOMY;  Surgeon: Rivet Claudis RAYMOND, MD;  [facility];  Service: Endoscopy;;  colon   POLYPECTOMY  09/01/2022   Procedure: POLYPECTOMY INTESTINAL;  Surgeon: Eartha Angelia Sieving, MD;  [facility];  Service: Gastroenterology;;   SPLENECTOMY, 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   spontaneous rupture   SPLENECTOMY, TOTAL     SUBMUCOSAL LIFTING INJECTION  09/01/2022   Procedure: SUBMUCOSAL LIFTING INJECTION;  Surgeon: Eartha Angelia Sieving, MD;  [facility];  Service: Gastroenterology;;   SUBMUCOSAL TATTOO INJECTION  09/01/2022   Procedure: SUBMUCOSAL TATTOO INJECTION;  Surgeon: Eartha Angelia Sieving, MD;  [facility];  Service: Gastroenterology;;   TEMPORARY PACEMAKER N/A 12/29/2019  Procedure: TEMPORARY PACEMAKER;  Surgeon: Dann Candyce RAMAN, MD;  Location: [facility] INVASIVE CV LAB;  Service: Cardiovascular;  Laterality: N/A;   TRANSURETHRAL RESECTION OF BLADDER TUMOR N/A 03/15/2023   Procedure: TRANSURETHRAL RESECTION OF BLADDER TUMOR (TURBT)with Gemcitibane;  Surgeon: Sherrilee Belvie CROME, MD;  Location: [facility];  Service: Urology;  Laterality: N/A;   TRANSURETHRAL RESECTION OF BLADDER TUMOR N/A 04/29/2023   Procedure: TRANSURETHRAL RESECTION OF BLADDER TUMOR (TURBT);  Surgeon: Sherrilee Belvie CROME, MD;  Location: [facility];  Service: Urology;  Laterality: N/A;   TRANSURETHRAL RESECTION OF BLADDER TUMOR N/A 09/09/2023   Procedure: TURBT (TRANSURETHRAL RESECTION OF BLADDER TUMOR);  Surgeon: Sherrilee Belvie CROME, MD;  Location: [facility];  Service: Urology;  Laterality: N/A;  with gemcitabine  instillation    Home  Medications:  Allergies as of 12/03/2023       Reactions   Gabapentin  Nausea And Vomiting   Other Reaction(s): Not available   Prednisone  Palpitations   Other Reaction(s): Not available   Carvedilol     Stopped due to bradycardia   Clonidine Derivatives    Stopped due to bradycardia   Diltiazem     Stopped due to bradycardia   Tape Other (See Comments)   Causes blisters to form   Latex Other (See Comments), Rash   Blisters Other Reaction(s): Not available        Medication List        Accurate as of December 03, 2023  9:29 AM. If you have any questions, ask your nurse or doctor.          STOP taking these medications    nitrofurantoin  (macrocrystal-monohydrate) 100 MG capsule Commonly known as: MACROBID        TAKE these medications    albuterol  108 (90 Base) MCG/ACT inhaler Commonly known as: VENTOLIN  HFA Inhale 1-2 puffs into the lungs every 6 (six) hours as needed for wheezing or shortness of breath.   amLODipine  10 MG tablet Commonly known as: NORVASC  Take 1 tablet (10 mg total) by mouth daily.   aspirin  EC 81 MG tablet Take 81 mg by mouth in the morning.   azaTHIOprine  50 MG tablet Commonly known as: IMURAN  Take 1 tablet (50 mg total) by mouth daily.   busPIRone 5 MG tablet Commonly known as: BUSPAR Take 5 mg by mouth 2 (two) times daily.   cetirizine 10 MG tablet Commonly known as: ZYRTEC Take 10 mg by mouth in the morning.   clopidogrel  75 MG tablet Commonly known as: PLAVIX  Take 1 tablet (75 mg total) by mouth daily.   ezetimibe  10 MG tablet Commonly known as: ZETIA  Take 1 tablet (10 mg total) by mouth daily.   fluconazole  150 MG tablet Commonly known as: DIFLUCAN  Take 1 tablet (150 mg total) by mouth daily.   furosemide  40 MG tablet Commonly known as: LASIX  Take 1 tablet (40 mg total) by mouth daily as needed for edema.   Gemtesa  75 MG Tabs Generic drug: Vibegron  Take 1 tablet (75 mg total) by mouth daily.    HYDROcodone -acetaminophen  10-325 MG tablet Commonly known as: NORCO Take 1 tablet by mouth every 6 (six) hours as needed for moderate pain (pain score 4-6).   HYDROcodone -acetaminophen  10-325 MG tablet Commonly known as: NORCO Take 1 tablet by mouth every 6 (six) hours as needed.   Jardiance 10 MG Tabs tablet Generic drug: empagliflozin TAKE (1) TABLET BY MOUTH EACH MORNING.   levothyroxine  50 MCG tablet Commonly known as: SYNTHROID  Take 50 mcg by mouth in the morning.  losartan  50 MG tablet Commonly known as: COZAAR  Take 1 tablet (50 mg total) by mouth daily.   nitroGLYCERIN  0.4 MG SL tablet Commonly known as: NITROSTAT  Place 0.4 mg under the tongue every 5 (five) minutes x 3 doses as needed for chest pain.   ondansetron  4 MG disintegrating tablet Commonly known as: ZOFRAN -ODT Take 4 mg by mouth every 8 (eight) hours as needed for nausea or vomiting.   pantoprazole  40 MG tablet Commonly known as: PROTONIX  Take 1 tablet (40 mg total) by mouth daily before breakfast.   rosuvastatin  40 MG tablet Commonly known as: CRESTOR  Take 1 tablet (40 mg total) by mouth daily.   Spiriva  Respimat 2.5 MCG/ACT Aers Generic drug: Tiotropium Bromide  Monohydrate Inhale 2 puffs into the lungs daily as needed (respiratory issues.).   ursodiol  500 MG tablet Commonly known as: Urso  Forte Take 1 tablet (500 mg total) by mouth 2 (two) times daily.   Vitamin D3 50 MCG (2000 UT) Tabs Take 2,000 Units by mouth in the morning.        Allergies:  Allergies  Allergen Reactions   Gabapentin  Nausea And Vomiting    Other Reaction(s): Not available   Prednisone  Palpitations    Other Reaction(s): Not available   Carvedilol      Stopped due to bradycardia   Clonidine Derivatives     Stopped due to bradycardia    Diltiazem      Stopped due to bradycardia    Tape Other (See Comments)    Causes blisters to form   Latex Other (See Comments) and Rash    Blisters  Other Reaction(s): Not  available    Family History: Family History  Problem Relation Age of Onset   Heart failure Mother    Cancer Father    Heart failure Father    Cancer Sister    Dementia Sister    Heart disease Other    Arthritis Other    Cancer Other    Diabetes Other    Kidney disease Other    Cancer Sister    Heart failure Brother     Social History:  reports that she has been smoking cigarettes. She has a 20 pack-year smoking history. She has been exposed to tobacco smoke. She has never used smokeless tobacco. She reports that she does not currently use alcohol. She reports that she does not use drugs.  ROS: All other review of systems were reviewed and are negative except what is noted above in HPI  Physical Exam: BP (!) 167/78   Pulse 84   Constitutional:  Alert and oriented, No acute distress. HEENT: [place] AT, moist mucus membranes.  Trachea midline, no masses. Cardiovascular: No clubbing, cyanosis, or edema. Respiratory: Normal respiratory effort, no increased work of breathing. GI: Abdomen is soft, nontender, nondistended, no abdominal masses GU: No CVA tenderness.  Lymph: No cervical or inguinal lymphadenopathy. Skin: No rashes, bruises or suspicious lesions. Neurologic: Grossly intact, no focal deficits, moving all 4 extremities. Psychiatric: Normal mood and affect.  Laboratory Data: Lab Results  Component Value Date   WBC 8.2 09/24/2023   HGB 14.1 09/24/2023   HCT 42.7 09/24/2023   MCV 90.3 09/24/2023   PLT 410 (H) 09/24/2023    Lab Results  Component Value Date   CREATININE 0.65 09/24/2023    No results found for: PSA  No results found for: TESTOSTERONE  Lab Results  Component Value Date   HGBA1C 7.1 (H) 10/19/2022    Urinalysis    Component Value Date/Time  COLORURINE RED (A) 09/24/2023 1739   APPEARANCEUR Clear 11/26/2023 0853   LABSPEC  09/24/2023 1739    TEST NOT REPORTED DUE TO COLOR INTERFERENCE OF URINE PIGMENT   PHURINE  09/24/2023 1739     TEST NOT REPORTED DUE TO COLOR INTERFERENCE OF URINE PIGMENT   GLUCOSEU 3+ (A) 11/26/2023 0853   HGBUR (A) 09/24/2023 1739    TEST NOT REPORTED DUE TO COLOR INTERFERENCE OF URINE PIGMENT   BILIRUBINUR Negative 11/26/2023 0853   KETONESUR (A) 09/24/2023 1739    TEST NOT REPORTED DUE TO COLOR INTERFERENCE OF URINE PIGMENT   PROTEINUR Negative 11/26/2023 0853   PROTEINUR NEGATIVE 09/24/2023 1739   UROBILINOGEN 1.0 07/01/2023 1121   UROBILINOGEN 0.2 11/03/2014 1722   NITRITE Negative 11/26/2023 0853   NITRITE (A) 09/24/2023 1739    TEST NOT REPORTED DUE TO COLOR INTERFERENCE OF URINE PIGMENT   LEUKOCYTESUR Negative 11/26/2023 0853   LEUKOCYTESUR (A) 09/24/2023 1739    TEST NOT REPORTED DUE TO COLOR INTERFERENCE OF URINE PIGMENT    Lab Results  Component Value Date   LABMICR Comment 11/26/2023   WBCUA 0-5 09/22/2023   LABEPIT 0-10 09/22/2023   BACTERIA RARE (A) 09/24/2023    Pertinent Imaging:  No results found for this or any previous visit.  Results for orders placed during the hospital encounter of 10/05/07  US  VenoUS Imaging Bilateral  Narrative Clinical Data: Left leg pain and swelling  VENOUS DUPLEX ULTRASOUND OF BILATERAL LOWER EXTREMITIES  Technique: Gray-scale sonography with graded compression, as well as color Doppler and duplex ultrasound, were performed to evaluate the deep venous system of both lower extremities from the level of the common femoral vein through the popliteal and proximal calf veins. Spectral Doppler was utilized to evaulate flow at rest and with distal augmentation maneuvers. Confirmed with physician's office that bilateral lower extremity exam requested.  Comparison: None  Findings: Deep venous system patent and compressible from the groin through popliteal fossa bilaterally.SABRA Spontaneous venous flow present with intact augmentation and evidence of respiratory phasicity. No intraluminal thrombus identified. Visualized portions of the  greater saphenous systems unremarkable.  IMPRESSION: No evidence of deep venous thrombosis.  Provider: Montie Axe  No results found for this or any previous visit.  No results found for this or any previous visit.  Results for orders placed during the hospital encounter of 02/02/23  US  Renal  Narrative CLINICAL DATA:  Painless hematuria  EXAM: RENAL / URINARY TRACT ULTRASOUND COMPLETE  COMPARISON:  CT renal stone 01/31/2023  FINDINGS: Right Kidney:  Renal measurements: 12.7 x 4.4 x 5.7 cm = volume: 161 mL. Echogenicity within normal limits. No mass or hydronephrosis visualized.  Left Kidney:  Renal measurements: 11.5 x 6.4 x 5.3 cm = volume: 205 mL. Echogenicity within normal limits. No mass or hydronephrosis visualized.  Bladder:  Under distended and not well evaluated.  Other:  None.  IMPRESSION: Normal renal ultrasound.   Electronically Signed By: Greig Pique M.D. On: 02/02/2023 18:00  No results found for this or any previous visit.  Results for orders placed in visit on 02/03/23  CT HEMATURIA WORKUP  Narrative CLINICAL DATA:  Gross hematuria.  EXAM: CT ABDOMEN AND PELVIS WITHOUT AND WITH CONTRAST  TECHNIQUE: Multidetector CT imaging of the abdomen and pelvis was performed following the standard protocol before and following the bolus administration of intravenous contrast.  RADIATION DOSE REDUCTION: This exam was performed according to the departmental dose-optimization program which includes automated exposure control, adjustment of the mA and/or  kV according to patient size and/or use of iterative reconstruction technique.  CONTRAST:  OMNIPAQUE  IOHEXOL  300 MG/ML  SOLN  COMPARISON:  CT renal stone protocol from 01/31/2023.  FINDINGS: Lower chest: There is a spiculated marginated 1.6 x 2.1 cm nodule in the right lung lower lobe, which was present but partially imaged on the recent prior exam. There is also a new  pleural-based 8 x 14 mm nodule in the right lung lower lobe. Findings are concerning for neoplastic process. Further evaluation with dedicated contrast-enhanced chest CT scan is recommended. The lung bases are otherwise clear. No pleural effusion or consolidation. The heart is normal in size. No pericardial effusion.  Hepatobiliary: The liver is normal in size. Non-cirrhotic configuration. No suspicious mass. No intrahepatic bile duct dilation. There is moderate dilation of the extrahepatic bile duct, most likely due to post cholecystectomy status. Gallbladder is surgically absent.  Pancreas: Unremarkable. No pancreatic ductal dilatation or surrounding inflammatory changes.  Spleen: Surgically absent. There is an accessory splenule in the left upper quadrant. There are also multiple smaller hyperattenuating nodules along the left diaphragmatic undersurface, also favored to represent splenules.  Adrenals/Urinary Tract: Adrenal glands are unremarkable.  Non-contrast images: No radiopaque urinary tract calculi.  Kidneys: Symmetric enhancement. No suspicious renal mass.  Urinary Tract Opacification: Adequate.  Collecting Systems and Ureters: No filling defects, masses, strictures, or areas of abnormal dilatation.  Urinary Bladder: Is decompressed precluding optimal assessment. However, note is made of mild bladder wall mucosal hyperenhancement and subtle perivesical fat stranding, concerning for cystitis. Correlate clinically and with urinalysis. No focal mass or bladder calculi.  Stomach/Bowel: No disproportionate dilation of the small or large bowel loops. No evidence of abnormal bowel wall thickening or inflammatory changes. The appendix was not visualized; however there is no acute inflammatory process in the right lower quadrant.  Vascular/Lymphatic: No ascites or pneumoperitoneum. No abdominal or pelvic lymphadenopathy, by size criteria. No aneurysmal dilation of the  major abdominal arteries. There are moderate peripheral atherosclerotic vascular calcifications of the aorta and its major branches.  Reproductive: The uterus is surgically absent. No large adnexal mass.  Other: There is a tiny fat containing umbilical hernia. The soft tissues and abdominal wall are otherwise unremarkable.  Musculoskeletal: No suspicious osseous lesions. There are mild multilevel degenerative changes in the visualized spine.  IMPRESSION: *No nephroureterolithiasis or obstructive uropathy. No suspicious renal, ureteric or urinary bladder mass. Mild urinary bladder wall mucosal hyperenhancement and subtle perivesical fat stranding, concerning for cystitis. Correlate clinically and with urinalysis. *There is a spiculated marginated 1.6 x 2.1 cm nodule in the right lung lower lobe and an additional pleural-based 8 x 14 mm nodule in the right lung lower lobe, which is new since the recent prior exam. Findings are concerning for neoplastic process. Further evaluation with dedicated contrast-enhanced chest CT scan is recommended, unless recently performed. *Multiple other nonacute observations, as described above.  Aortic Atherosclerosis (ICD10-I70.0).   Electronically Signed By: Ree Molt M.D. On: 02/23/2023 14:38  Results for orders placed during the hospital encounter of 09/24/23  CT Renal Stone Study  Narrative CLINICAL DATA:  TURBT 09/09/2023.  Hematuria.  EXAM: CT ABDOMEN AND PELVIS WITHOUT CONTRAST  TECHNIQUE: Multidetector CT imaging of the abdomen and pelvis was performed following the standard protocol without IV contrast.  RADIATION DOSE REDUCTION: This exam was performed according to the departmental dose-optimization program which includes automated exposure control, adjustment of the mA and/or kV according to patient size and/or use of iterative reconstruction technique.  COMPARISON:  02/13/2023  FINDINGS: Lower chest: The medial  right base lung nodule has resolved. The vertical level of the 2 cm right lower lobe pulmonary nodule shown on the 02/13/2023 exam is not included on today's exam, and accordingly we were not able to determine whether this has resolved or if it represents a lung cancer. Did the patient undergo the recommended chest CT? If not, chest CT at this time is recommended to exclude lung cancer.  Coronary and descending thoracic aortic atherosclerosis.  Hepatobiliary: Nodularity of the liver suggests cirrhosis. Gallbladder absent. Continued mild extrahepatic biliary dilatation.  Pancreas: Unremarkable  Spleen: Splenectomy with regenerative splenic tissue noted.  Adrenals/Urinary Tract: No hydronephrosis or hydroureter. The urinary bladder is empty. Adrenal glands unremarkable. No urinary tract calculi.  Stomach/Bowel: Mild sigmoid colon diverticulosis. 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  Vascular/Lymphatic: Atherosclerosis is present, including aortoiliac atherosclerotic disease. Numerous small and upper normal size retroperitoneal lymph nodes as before.  Reproductive: Uterus absent.  Other: No supplemental non-categorized findings.  Musculoskeletal: Mild grade 1 degenerative anterolisthesis at L3-4. Mild degenerative hip arthropathy bilaterally.  IMPRESSION: 1. The more caudad medial right base lung nodule shown on prior chest CT has resolved. However, vertical level of the 2 cm right lower lobe pulmonary nodule shown on the 02/13/2023 exam is not included on today's exam, and accordingly we were not able to determine whether this has resolved or if it represents a lung cancer. Did the patient undergo the recommended chest CT? If not, chest CT at this time is recommended to exclude lung cancer. 2. Nodularity of the liver suggests cirrhosis. 3. Mild sigmoid colon diverticulosis. 4. Mild grade 1 degenerative anterolisthesis at L3-4. 5. Mild degenerative hip arthropathy  bilaterally. 6.  Aortic Atherosclerosis (ICD10-I70.0).   Electronically Signed By: Ryan Salvage M.D. On: 09/24/2023 20:48   Assessment & Plan:    1. Malignant neoplasm of lateral wall of urinary bladder (HCC) (Primary) Continue BCG therapy - Urinalysis, Routine w reflex microscopic - bcg vaccine injection 81 mg - In and Out Cath   No follow-ups on file.  Belvie Clara, MD  [facility] [place]

## 2023-12-03 NOTE — Progress Notes (Signed)
 BCG Bladder Instillation  BCG # 5 of 6  Due to Bladder Cancer, patient is present today for BCG treatment. The patient was cleaned and prepped in a sterile fashion with Betadinex3 A 14 FR catheter was inserted, urine return was noted 70 ml, urine was Dark yellowin color.  50ml of reconstituted BCG was then instilled into the bladder. The catheter was then removed. Patient tolerated well, no complications were noted The patient given and discussed with BCG education attached to the AVS.   Performed by: Exie T. CMA  Follow up-1 week with [facility] for BCG

## 2023-12-03 NOTE — Patient Instructions (Signed)
 Bladder Cancer  Bladder cancer is a condition where abnormal tissue (a tumor) grows in the bladder. The bladder is the organ that holds urine. Two tubes (ureters) carry urine from the kidneys to the bladder. The bladder wall is made of layers of tissue. Cancer that spreads through these layers of the bladder wall becomes more difficult to treat. What increases the risk? The following factors may make you more likely to develop this condition: Smoking. Working where there are risks (occupational exposures), such as working with Animal nutritionist, Administrator, clothing fabric, dyes, chemicals, or paint. Being 42 years of age or older. Being female. Having long-term bladder inflammation. Having a history of cancer. This includes: A family history of bladder cancer. Having had bladder cancer before. Having had certain treatments for cancer before, such as: Medicines to kill cancer cells (chemotherapy). Strong X-ray beams or high-energy capsules to kill cancer cells and shrink tumors (radiation therapy). Having been exposed to arsenic. This is a poisonous substance. What are the signs or symptoms? Early symptoms of this condition include: Blood in your urine. Pain when urinating. Infections of your urinary system (urinary tract infections or UTIs) that happen often. Having to urinate sooner or more often than normal. Late symptoms of this condition include: Not being able to urinate. Pain on one side of your lower back. Loss of appetite. Weight loss. Tiredness (fatigue). Swelling in your feet. Bone pain. How is this diagnosed? This condition is diagnosed based on: Your medical history. A physical exam. Lab tests, such as urine tests. Imaging tests. Your symptoms. You may also have other tests or procedures, such as: A cystoscopy. This involves putting a narrow tube into your urethra. The urethra is the organ that carries urine from your bladder to the outside of your body. This procedure is done to  view the lining of your bladder for tumors. A biopsy. This involves removing a tissue sample to look at under a microscope to check for cancer. Blood tests or imaging tests may be needed. These show how far into the bladder wall cancer has grown, and if cancer has spread to any other parts of your body. Tests may include: CT scan. MRI. Bone scan. X-ray. How is this treated? Your health care provider may recommend one or more types of treatment based on the stage of your cancer. The most common treatments are: Surgery to remove the cancer. Types of surgeries include: Removing a tumor on the inside wall of the bladder (transurethral resection). Removing the bladder (cystectomy). Radiation therapy. This is often combined with chemotherapy. Chemotherapy. Immunotherapy. This uses medicines to help your body's disease-fighting system (immune system) destroy cancer cells. Follow these instructions at home: Take over-the-counter and prescription medicines only as told by your health care provider. If you were prescribed an antibiotic medicine, take it as told by your health care provider. Do not stop using the antibiotic even if you start to feel better. Eat a healthy diet. Some treatments might affect your appetite. Do not use any products that contain nicotine or tobacco. These products include cigarettes, chewing tobacco, and vaping devices, such as e-cigarettes. If you need help quitting, ask your health care provider. Consider joining a support group. This may help you learn to deal with the stress of having bladder cancer. Tell your cancer care team if you develop side effects. Your team may be able to recommend ways to get relief. Keep all follow-up visits. This is important. Where to find more information American Cancer Society (ACS): cancer.org National  Cancer Institute (NCI): cancer.gov Contact a health care provider if: You have symptoms of a UTI. These  include: Fever. Chills. Weakness. Muscle aches. Pain in your abdomen. Urge to urinate that is stronger and happens more often than normal. Burning in the bladder or urethra when you urinate. Get help right away if: There is blood in your urine. You cannot urinate. You have severe pain or other symptoms that do not go away. Summary Bladder cancer is a condition where tumors grow in the bladder. Diagnosis is based on your medical history, a physical exam, lab tests, imaging tests, and your symptoms. Your health care provider may recommend one or more types of treatment based on the stage of your cancer. Consider joining a support group. This may help you learn to deal with the stress of having bladder cancer. This information is not intended to replace advice given to you by your health care provider. Make sure you discuss any questions you have with your health care provider. Document Revised: 02/10/2021 Document Reviewed: 02/10/2021 Elsevier Patient Education  2024 [employer].

## 2023-12-08 ENCOUNTER — Ambulatory Visit (HOSPITAL_BASED_OUTPATIENT_CLINIC_OR_DEPARTMENT_OTHER)
Admission: RE | Admit: 2023-12-08 | Discharge: 2023-12-08 | Disposition: A | Discharge status: Home / Self Care | Source: Ambulatory Visit | Attending: Cardiology | Admitting: Cardiology

## 2023-12-08 ENCOUNTER — Ambulatory Visit (HOSPITAL_COMMUNITY)
Admission: RE | Admit: 2023-12-08 | Discharge: 2023-12-08 | Disposition: A | Discharge status: Home / Self Care | Source: Ambulatory Visit | Attending: Cardiology | Admitting: Cardiology

## 2023-12-08 ENCOUNTER — Other Ambulatory Visit: Payer: Self-pay | Admitting: Physician Assistant

## 2023-12-08 DIAGNOSIS — R079 Chest pain, unspecified: Secondary | ICD-10-CM | POA: Insufficient documentation

## 2023-12-08 LAB — NM MYOCAR MULTI W/SPECT W/WALL MOTION / EF
LV dias vol: 70 mL (ref 46–106)
LV sys vol: 20 mL (ref 3.8–5.2)
MPHR: 147 {beats}/min
Nuc Stress EF: 71 %
Peak HR: 109 {beats}/min
Percent HR: 74 %
RATE: 0.3
Rest HR: 80 {beats}/min
Rest Nuclear Isotope Dose: 11 mCi
SDS: 1
SRS: 6
SSS: 5
ST Depression (mm): 0 mm
Stress Nuclear Isotope Dose: 32.2 mCi
TID: 1.32

## 2023-12-08 MED ORDER — TECHNETIUM TC 99M TETROFOSMIN IV KIT
30.0000 | PACK | Freq: Once | INTRAVENOUS | Status: AC | PRN
  Administered 2023-12-08: 32.2 via INTRAVENOUS

## 2023-12-08 MED ORDER — SODIUM CHLORIDE FLUSH 0.9 % IV SOLN
INTRAVENOUS | Status: AC
  Filled 2023-12-08: qty 10

## 2023-12-08 MED ORDER — REGADENOSON 0.4 MG/5ML IV SOLN
INTRAVENOUS | Status: AC
  Administered 2023-12-08: 0.4 mg via INTRAVENOUS
  Filled 2023-12-08: qty 5

## 2023-12-08 MED ORDER — TECHNETIUM TC 99M TETROFOSMIN IV KIT
10.0000 | PACK | Freq: Once | INTRAVENOUS | Status: AC | PRN
  Administered 2023-12-08: 11 via INTRAVENOUS

## 2023-12-08 NOTE — Progress Notes (Signed)
     [name] presented for a Lexiscan  nuclear stress test today.  I Lorette CINDERELLA Kapur, PA-C, provided direct supervision and was present during the stress portion of the study today, which was completed without significant symptoms, immediate complications, or acute ST/T changes on ECG.  Stress imaging is pending at this time.  Preliminary ECG findings may be listed in the chart, but the stress test result will not be finalized until perfusion imaging is complete.  Lorette CINDERELLA Kapur, PA-C  12/08/2023, 10:40 AM

## 2023-12-10 ENCOUNTER — Ambulatory Visit: Admitting: Urology

## 2023-12-10 DIAGNOSIS — C672 Malignant neoplasm of lateral wall of bladder: Secondary | ICD-10-CM

## 2023-12-15 ENCOUNTER — Ambulatory Visit: Admitting: Urology

## 2023-12-15 ENCOUNTER — Encounter: Payer: Self-pay | Admitting: Urology

## 2023-12-15 VITALS — BP 161/89 | HR 89

## 2023-12-15 DIAGNOSIS — C672 Malignant neoplasm of lateral wall of bladder: Secondary | ICD-10-CM

## 2023-12-15 LAB — URINALYSIS, ROUTINE W REFLEX MICROSCOPIC
Bilirubin, UA: NEGATIVE
Ketones, UA: NEGATIVE
Leukocytes,UA: NEGATIVE
Nitrite, UA: NEGATIVE
Protein,UA: NEGATIVE
Specific Gravity, UA: 1.01 (ref 1.005–1.030)
Urobilinogen, Ur: 0.2 mg/dL (ref 0.2–1.0)
pH, UA: 6 (ref 5.0–7.5)

## 2023-12-15 MED ORDER — BCG LIVE 50 MG IS SUSR
3.2400 mL | Freq: Once | INTRAVESICAL | Status: AC
  Administered 2023-12-15: 81 mg via INTRAVESICAL

## 2023-12-15 NOTE — Progress Notes (Signed)
 12/15/2023 10:14 AM   [name] 1951-02-24 [id number]  Referring provider: Shona Norleen PEDLAR, MD [address] [name],  [place] [address]  Followup bladder cancer   HPI: [name] is a 72yo here for followup for bladder cancer. She just completed BCG #6 today. She denies nay current hematuria but had an episode last week. She denies any dysuria. She continues to have urinary frequency and urgency which is improved with gemtesa    PMH: Past Medical History:  Diagnosis Date   Anxiety    Arthritis    hands, arms, back, neck, knees, fingers (01/21/2016)   Bradycardia    Breast cancer, left breast (HCC) 1970s; 2015   CAD (coronary artery disease)    a. s/p NSTEMI in 2017 with DES to OM and unsuccessful PCI to RCA with wire dissection b. DES to RCA in 2021   Cancer of skin of leg    BLE   Carotid artery dissection    left   Chronic lower back pain    Chronic pain    COPD (chronic obstructive pulmonary disease) (HCC)    Coronary artery disease    Depression    GERD (gastroesophageal reflux disease)    Heart murmur    Hepatic cirrhosis (HCC) 12/20/2017   Hepatitis    History of blood transfusion    related to spleen   History of hiatal hernia    History of kidney stones    Hyperlipidemia    Hypertension    Hypothyroidism    Long term prescription benzodiazepine use 12/10/2022   Melanoma of forearm, left (HCC)    Migraine    a few migraines/year (01/21/2016)   Obesity    Polypharmacy 12/10/2022   Stroke (HCC) 04/2003   2 carotid artery stents in place; small left parietal and left hemispheric CVA.hazeline 01/21/2016   Tobacco use disorder    50 pack years; questionably discontinued in 2010   Wears glasses     Surgical History: Past Surgical History:  Procedure Laterality Date   ABDOMINAL HYSTERECTOMY  1978   APPENDECTOMY     BIOPSY  04/28/2022   Procedure: BIOPSY;  Surgeon: Eartha Angelia Sieving, MD;  [facility];  Service:  Gastroenterology;;   BLADDER INSTILLATION N/A 04/29/2023   Procedure: BLADDER INSTILLATION- gemcitabine ;  Surgeon: Sherrilee Belvie CROME, MD;  Location: [facility];  Service: Urology;  Laterality: N/A;   BREAST BIOPSY Left 1970s; 2015   BREAST LUMPECTOMY Left 1970s   BREAST LUMPECTOMY WITH NEEDLE LOCALIZATION Left 07/07/2013   Procedure: BREAST LUMPECTOMY WITH NEEDLE LOCALIZATION;  Surgeon: Deward GORMAN Curvin DOUGLAS, MD;  Location: [facility];  Service: General;  Laterality: Left;   CARDIAC CATHETERIZATION N/A 01/22/2016   Procedure: Left Heart Cath and Coronary Angiography;  Surgeon: Peter M Swaziland, MD;  Location: [facility] INVASIVE CV LAB;  Service: Cardiovascular;  Laterality: N/A;   CARDIAC CATHETERIZATION N/A 01/22/2016   Procedure: Coronary Stent Intervention;  Surgeon: Peter M Swazil[place], MD;  Location: [facility] INVASIVE CV LAB;  Service: Cardiovascular;  Laterality: N/A;   CAROTID STENT Left 04/2003   small left parietal and left hemispheric CVA/notes 07/29/2010   CHOLECYSTECTOMY OPEN     COLONOSCOPY  2011   COLONOSCOPY N/A 05/10/2019   Procedure: COLONOSCOPY;  Surgeon: Golda Claudis PENNER, MD;  [facility];  Service: Endoscopy;  Laterality: N/A;  1:45   COLONOSCOPY WITH PROPOFOL  N/A 09/01/2022   Procedure: COLONOSCOPY WITH PROPOFOL ;  Surgeon: Eartha Angelia Sieving, MD;  [facility];  Service: Gastroenterology;  Laterality: N/A;  1:45PM;ASA 3   CORONARY ATHERECTOMY N/A 12/29/2019   Procedure: CORONARY ATHERECTOMY;  Surgeon: Dann Candyce RAMAN, MD;  Location: [facility]VAS[facility] CV LAB;  Service: Cardiovascular;  Laterality: N/A;  Prox RCA   CORONARY STENT INTERVENTION N/A 12/29/2019   Procedure: CORONARY STENT INTERVENTION;  Surgeon: Dann Candyce RAMAN, MD;  Location: [facility] INVASIVE CV LAB;  Service: Cardiovascular;  Laterality: N/A;  Prox RCA   CORONARY ULTRASOUND/IVUS N/A 12/29/2019   Procedure: Intravascular Ultrasound/IVUS;  Surgeon: Dann Candyce RAMAN, MD;  Location: [facility] INVASIVE  CV LAB;  Service: Cardiovascular;  Laterality: N/A;   CYSTOSCOPY WITH BIOPSY N/A 04/29/2023   Procedure: CYSTOSCOPY WITH BIOPSY;  Surgeon: Sherrilee Belvie CROME, MD;  Location: [facility];  Service: Urology;  Laterality: N/A;   ESOPHAGEAL DILATION N/A 04/28/2022   Procedure: ESOPHAGEAL DILATION;  Surgeon: Eartha Angelia Sieving, MD;  [facility];  Service: Gastroenterology;  Laterality: N/A;   ESOPHAGOGASTRODUODENOSCOPY  08/06/2011   Procedure: ESOPHAGOGASTRODUODENOSCOPY (EGD);  Surgeon: Claudis RAYMOND Rivet, MD;  [facility];  Service: Endoscopy;  Laterality: N/A;   ESOPHAGOGASTRODUODENOSCOPY N/A 05/10/2019   Procedure: ESOPHAGOGASTRODUODENOSCOPY (EGD);  Surgeon: Rivet Claudis RAYMOND, MD;  [facility];  Service: Endoscopy;  Laterality: N/A;   ESOPHAGOGASTRODUODENOSCOPY (EGD) WITH PROPOFOL  N/A 04/28/2022   Procedure: ESOPHAGOGASTRODUODENOSCOPY (EGD) WITH PROPOFOL ;  Surgeon: Eartha Angelia Sieving, MD;  [facility];  Service: Gastroenterology;  Laterality: N/A;  10:30 am, pt can't move up due to transportation   FLEXIBLE SIGMOIDOSCOPY  08/06/2011   Procedure: FLEXIBLE SIGMOIDOSCOPY;  Surgeon: Claudis RAYMOND Rivet, MD;  [facility];  Service: Endoscopy;  Laterality: N/A;   HEMOSTASIS CLIP PLACEMENT  09/01/2022   Procedure: HEMOSTASIS CLIP PLACEMENT;  Surgeon: Eartha Angelia Sieving, MD;  [facility];  Service: Gastroenterology;;   INGUINAL HERNIA REPAIR Left 1970s   IR ANGIO INTRA EXTRACRAN SEL COM CAROTID INNOMINATE BILAT MOD SED  11/01/2020   IR ANGIO VERTEBRAL SEL VERTEBRAL BILAT MOD SED  11/01/2020   IR RADIOLOGIST EVAL & MGMT  09/23/2020   IR US  GUIDE VASC ACCESS RIGHT  11/01/2020   LARYNX SURGERY  1970s   Polyps excised   LEFT HEART CATH AND CORONARY ANGIOGRAPHY N/A 12/29/2019   Procedure: LEFT HEART CATH AND CORONARY ANGIOGRAPHY;  Surgeon: Dann Candyce RAMAN, MD;  Location: [facility]VASIVE CV LAB;  Service: Cardiovascular;   Laterality: N/A;   LEFT HEART CATH AND CORONARY ANGIOGRAPHY N/A 10/12/2022   Procedure: LEFT HEART CATH AND CORONARY ANGIOGRAPHY;  Surgeon: Dann Candyce RAMAN, MD;  Location: [facility] INVASIVE CV LAB;  Service: Cardiovascular;  Laterality: N/A;   MALONEY DILATION  05/10/2019   Procedure: AGAPITO DILATION;  Surgeon: Rivet Claudis RAYMOND, MD;  [facility];  Service: Endoscopy;;   MELANOMA EXCISION Left ~ 2016   forearm   POLYPECTOMY  05/10/2019   Procedure: POLYPECTOMY;  Surgeon: Rivet Claudis RAYMOND, MD;  [facility];  Service: Endoscopy;;  colon   POLYPECTOMY  09/01/2022   Procedure: POLYPECTOMY INTESTINAL;  Surgeon: Eartha Angelia Sieving, MD;  [facility];  Service: Gastroenterology;;   SPLENECTOMY, 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   spontaneous rupture   SPLENECTOMY, TOTAL     SUBMUCOSAL LIFTING INJECTION  09/01/2022   Procedure: SUBMUCOSAL LIFTING INJECTION;  Surgeon: Eartha Angelia Sieving, MD;  [facility];  Service: Gastroenterology;;   SUBMUCOSAL TATTOO INJECTION  09/01/2022   Procedure: SUBMUCOSAL TATTOO INJECTION;  Surgeon: Eartha Angelia Sieving, MD;  [facility];  Service: Gastroenterology;;   TEMPORARY PACEMAKER N/A 12/29/2019   Procedure: TEMPORARY PACEMAKER;  Surgeon: Dann Candyce RAMAN, MD;  Location: [facility] INVASIVE CV LAB;  Service: Cardiovascular;  Laterality: N/A;   TRANSURETHRAL RESECTION OF BLADDER TUMOR N/A 03/15/2023   Procedure: TRANSURETHRAL RESECTION OF BLADDER TUMOR (TURBT)with Gemcitibane;  Surgeon: Sherrilee Belvie CROME, MD;  Location: [facility];  Service: Urology;  Laterality: N/A;   TRANSURETHRAL RESECTION OF BLADDER TUMOR N/A 04/29/2023   Procedure: TRANSURETHRAL RESECTION OF BLADDER TUMOR (TURBT);  Surgeon: Sherrilee Belvie CROME, MD;  Location: [facility];  Service: Urology;  Laterality: N/A;   TRANSURETHRAL RESECTION OF BLADDER TUMOR N/A 09/09/2023   Procedure: TURBT (TRANSURETHRAL RESECTION OF BLADDER TUMOR);  Surgeon: Sherrilee Belvie CROME, MD;  Location: [facility];  Service: Urology;  Laterality: N/A;  with gemcitabine  instillation    Home Medications:  Allergies as of 12/15/2023       Reactions   Gabapentin  Nausea And Vomiting   Other Reaction(s): Not available   Prednisone  Palpitations   Other Reaction(s): Not available   Carvedilol     Stopped due to bradycardia   Clonidine Derivatives    Stopped due to bradycardia   Diltiazem     Stopped due to bradycardia   Tape Other (See Comments)   Causes blisters to form   Latex Other (See Comments), Rash   Blisters Other Reaction(s): Not available        Medication List        Accurate as of December 15, 2023 10:14 AM. If you have any questions, ask your nurse or doctor.          albuterol  108 (90 Base) MCG/ACT inhaler Commonly known as: VENTOLIN  HFA Inhale 1-2 puffs into the lungs every 6 (six) hours as needed for wheezing or shortness of breath.   amLODipine  10 MG tablet Commonly known as: NORVASC  Take 1 tablet (10 mg total) by mouth daily.   aspirin  EC 81 MG tablet Take 81 mg by mouth in the morning.   azaTHIOprine  50 MG tablet Commonly known as: IMURAN  Take 1 tablet (50 mg total) by mouth daily.   busPIRone 5 MG tablet Commonly known as: BUSPAR Take 5 mg by mouth 2 (two) times daily.   cetirizine 10 MG tablet Commonly known as: ZYRTEC Take 10 mg by mouth in the morning.   clopidogrel  75 MG tablet Commonly known as: PLAVIX  Take 1 tablet (75 mg total) by mouth daily.   ezetimibe  10 MG tablet Commonly known as: ZETIA  Take 1 tablet (10 mg total) by mouth daily.   fluconazole  150 MG tablet Commonly known as: DIFLUCAN  Take 1 tablet (150 mg total) by mouth daily.   furosemide  40 MG tablet Commonly known as: LASIX  Take 1 tablet (40 mg total) by mouth daily as needed for edema.   Gemtesa  75 MG Tabs Generic drug: Vibegron  Take 1 tablet (75 mg total) by mouth daily.   HYDROcodone -acetaminophen  10-325 MG tablet Commonly known as:  NORCO Take 1 tablet by mouth every 6 (six) hours as needed for moderate pain (pain score 4-6).   HYDROcodone -acetaminophen  10-325 MG tablet Commonly known as: NORCO Take 1 tablet by mouth every 6 (six) hours as needed.   Jardiance 10 MG Tabs tablet Generic drug: empagliflozin TAKE (1) TABLET BY MOUTH EACH MORNING.   levothyroxine  50 MCG tablet Commonly known as: SYNTHROID  Take 50 mcg by mouth in the morning.   losartan  50 MG tablet Commonly known as: COZAAR  Take 1 tablet (50 mg total)  by mouth daily.   nitroGLYCERIN  0.4 MG SL tablet Commonly known as: NITROSTAT  Place 0.4 mg under the tongue every 5 (five) minutes x 3 doses as needed for chest pain.   ondansetron  4 MG disintegrating tablet Commonly known as: ZOFRAN -ODT Take 4 mg by mouth every 8 (eight) hours as needed for nausea or vomiting.   pantoprazole  40 MG tablet Commonly known as: PROTONIX  Take 1 tablet (40 mg total) by mouth daily before breakfast.   rosuvastatin  40 MG tablet Commonly known as: CRESTOR  Take 1 tablet (40 mg total) by mouth daily.   Spiriva  Respimat 2.5 MCG/ACT Aers Generic drug: Tiotropium Bromide  Monohydrate Inhale 2 puffs into the lungs daily as needed (respiratory issues.).   ursodiol  500 MG tablet Commonly known as: Urso  Forte Take 1 tablet (500 mg total) by mouth 2 (two) times daily.   Vitamin D3 50 MCG (2000 UT) Tabs Take 2,000 Units by mouth in the morning.        Allergies:  Allergies  Allergen Reactions   Gabapentin  Nausea And Vomiting    Other Reaction(s): Not available   Prednisone  Palpitations    Other Reaction(s): Not available   Carvedilol      Stopped due to bradycardia   Clonidine Derivatives     Stopped due to bradycardia    Diltiazem      Stopped due to bradycardia    Tape Other (See Comments)    Causes blisters to form   Latex Other (See Comments) and Rash    Blisters  Other Reaction(s): Not available    Family History: Family History  Problem Relation  Age of Onset   Heart failure Mother    Cancer Father    Heart failure Father    Cancer Sister    Dementia Sister    Heart disease Other    Arthritis Other    Cancer Other    Diabetes Other    Kidney disease Other    Cancer Sister    Heart failure Brother     Social History:  reports that she has been smoking cigarettes. She has a 20 pack-year smoking history. She has been exposed to tobacco smoke. She has never used smokeless tobacco. She reports that she does not currently use alcohol. She reports that she does not use drugs.  ROS: All other review of systems were reviewed and are negative except what is noted above in HPI  Physical Exam: BP (!) 161/89   Pulse 89   Constitutional:  Alert and oriented, No acute distress. HEENT: [place] AT, moist mucus membranes.  Trachea midline, no masses. Cardiovascular: No clubbing, cyanosis, or edema. Respiratory: Normal respiratory effort, no increased work of breathing. GI: Abdomen is soft, nontender, nondistended, no abdominal masses GU: No CVA tenderness.  Lymph: No cervical or inguinal lymphadenopathy. Skin: No rashes, bruises or suspicious lesions. Neurologic: Grossly intact, no focal deficits, moving all 4 extremities. Psychiatric: Normal mood and affect.  Laboratory Data: Lab Results  Component Value Date   WBC 8.2 09/24/2023   HGB 14.1 09/24/2023   HCT 42.7 09/24/2023   MCV 90.3 09/24/2023   PLT 410 (H) 09/24/2023    Lab Results  Component Value Date   CREATININE 0.65 09/24/2023    No results found for: PSA  No results found for: TESTOSTERONE  Lab Results  Component Value Date   HGBA1C 7.1 (H) 10/19/2022    Urinalysis    Component Value Date/Time   COLORURINE RED (A) 09/24/2023 1739   APPEARANCEUR Clear 12/03/2023 0904  LABSPEC  09/24/2023 1739    TEST NOT REPORTED DUE TO COLOR INTERFERENCE OF URINE PIGMENT   PHURINE  09/24/2023 1739    TEST NOT REPORTED DUE TO COLOR INTERFERENCE OF URINE PIGMENT    GLUCOSEU 3+ (A) 12/03/2023 0904   HGBUR (A) 09/24/2023 1739    TEST NOT REPORTED DUE TO COLOR INTERFERENCE OF URINE PIGMENT   BILIRUBINUR Negative 12/03/2023 0904   KETONESUR (A) 09/24/2023 1739    TEST NOT REPORTED DUE TO COLOR INTERFERENCE OF URINE PIGMENT   PROTEINUR Negative 12/03/2023 0904   PROTEINUR NEGATIVE 09/24/2023 1739   UROBILINOGEN 1.0 07/01/2023 1121   UROBILINOGEN 0.2 11/03/2014 1722   NITRITE Negative 12/03/2023 0904   NITRITE (A) 09/24/2023 1739    TEST NOT REPORTED DUE TO COLOR INTERFERENCE OF URINE PIGMENT   LEUKOCYTESUR Negative 12/03/2023 0904   LEUKOCYTESUR (A) 09/24/2023 1739    TEST NOT REPORTED DUE TO COLOR INTERFERENCE OF URINE PIGMENT    Lab Results  Component Value Date   LABMICR Comment 12/03/2023   WBCUA 0-5 09/22/2023   LABEPIT 0-10 09/22/2023   BACTERIA RARE (A) 09/24/2023    Pertinent Imaging:  No results found for this or any previous visit.  Results for orders placed during the hospital encounter of 10/05/07  US  VenoUS Imaging Bilateral  Narrative Clinical Data: Left leg pain and swelling  VENOUS DUPLEX ULTRASOUND OF BILATERAL LOWER EXTREMITIES  Technique: Gray-scale sonography with graded compression, as well as color Doppler and duplex ultrasound, were performed to evaluate the deep venous system of both lower extremities from the level of the common femoral vein through the popliteal and proximal calf veins. Spectral Doppler was utilized to evaulate flow at rest and with distal augmentation maneuvers. Confirmed with physician's office that bilateral lower extremity exam requested.  Comparison: None  Findings: Deep venous system patent and compressible from the groin through popliteal fossa bilaterally.SABRA Spontaneous venous flow present with intact augmentation and evidence of respiratory phasicity. No intraluminal thrombus identified. Visualized portions of the greater saphenous systems unremarkable.  IMPRESSION: No  evidence of deep venous thrombosis.  Provider: Montie Axe  No results found for this or any previous visit.  No results found for this or any previous visit.  Results for orders placed during the hospital encounter of 02/02/23  US  Renal  Narrative CLINICAL DATA:  Painless hematuria  EXAM: RENAL / URINARY TRACT ULTRASOUND COMPLETE  COMPARISON:  CT renal stone 01/31/2023  FINDINGS: Right Kidney:  Renal measurements: 12.7 x 4.4 x 5.7 cm = volume: 161 mL. Echogenicity within normal limits. No mass or hydronephrosis visualized.  Left Kidney:  Renal measurements: 11.5 x 6.4 x 5.3 cm = volume: 205 mL. Echogenicity within normal limits. No mass or hydronephrosis visualized.  Bladder:  Under distended and not well evaluated.  Other:  None.  IMPRESSION: Normal renal ultrasound.   Electronically Signed By: Greig Pique M.D. On: 02/02/2023 18:00  No results found for this or any previous visit.  Results for orders placed in visit on 02/03/23  CT HEMATURIA WORKUP  Narrative CLINICAL DATA:  Gross hematuria.  EXAM: CT ABDOMEN AND PELVIS WITHOUT AND WITH CONTRAST  TECHNIQUE: Multidetector CT imaging of the abdomen and pelvis was performed following the standard protocol before and following the bolus administration of intravenous contrast.  RADIATION DOSE REDUCTION: This exam was performed according to the departmental dose-optimization program which includes automated exposure control, adjustment of the mA and/or kV according to patient size and/or use of iterative reconstruction technique.  CONTRAST:  OMNIPAQUE  IOHEXOL  300 MG/ML  SOLN  COMPARISON:  CT renal stone protocol from 01/31/2023.  FINDINGS: Lower chest: There is a spiculated marginated 1.6 x 2.1 cm nodule in the right lung lower lobe, which was present but partially imaged on the recent prior exam. There is also a new pleural-based 8 x 14 mm nodule in the right lung lower lobe.  Findings are concerning for neoplastic process. Further evaluation with dedicated contrast-enhanced chest CT scan is recommended. The lung bases are otherwise clear. No pleural effusion or consolidation. The heart is normal in size. No pericardial effusion.  Hepatobiliary: The liver is normal in size. Non-cirrhotic configuration. No suspicious mass. No intrahepatic bile duct dilation. There is moderate dilation of the extrahepatic bile duct, most likely due to post cholecystectomy status. Gallbladder is surgically absent.  Pancreas: Unremarkable. No pancreatic ductal dilatation or surrounding inflammatory changes.  Spleen: Surgically absent. There is an accessory splenule in the left upper quadrant. There are also multiple smaller hyperattenuating nodules along the left diaphragmatic undersurface, also favored to represent splenules.  Adrenals/Urinary Tract: Adrenal glands are unremarkable.  Non-contrast images: No radiopaque urinary tract calculi.  Kidneys: Symmetric enhancement. No suspicious renal mass.  Urinary Tract Opacification: Adequate.  Collecting Systems and Ureters: No filling defects, masses, strictures, or areas of abnormal dilatation.  Urinary Bladder: Is decompressed precluding optimal assessment. However, note is made of mild bladder wall mucosal hyperenhancement and subtle perivesical fat stranding, concerning for cystitis. Correlate clinically and with urinalysis. No focal mass or bladder calculi.  Stomach/Bowel: No disproportionate dilation of the small or large bowel loops. No evidence of abnormal bowel wall thickening or inflammatory changes. The appendix was not visualized; however there is no acute inflammatory process in the right lower quadrant.  Vascular/Lymphatic: No ascites or pneumoperitoneum. No abdominal or pelvic lymphadenopathy, by size criteria. No aneurysmal dilation of the major abdominal arteries. There are moderate  peripheral atherosclerotic vascular calcifications of the aorta and its major branches.  Reproductive: The uterus is surgically absent. No large adnexal mass.  Other: There is a tiny fat containing umbilical hernia. The soft tissues and abdominal wall are otherwise unremarkable.  Musculoskeletal: No suspicious osseous lesions. There are mild multilevel degenerative changes in the visualized spine.  IMPRESSION: *No nephroureterolithiasis or obstructive uropathy. No suspicious renal, ureteric or urinary bladder mass. Mild urinary bladder wall mucosal hyperenhancement and subtle perivesical fat stranding, concerning for cystitis. Correlate clinically and with urinalysis. *There is a spiculated marginated 1.6 x 2.1 cm nodule in the right lung lower lobe and an additional pleural-based 8 x 14 mm nodule in the right lung lower lobe, which is new since the recent prior exam. Findings are concerning for neoplastic process. Further evaluation with dedicated contrast-enhanced chest CT scan is recommended, unless recently performed. *Multiple other nonacute observations, as described above.  Aortic Atherosclerosis (ICD10-I70.0).   Electronically Signed By: Ree Molt M.D. On: 02/23/2023 14:38  Results for orders placed during the hospital encounter of 09/24/23  CT Renal Stone Study  Narrative CLINICAL DATA:  TURBT 09/09/2023.  Hematuria.  EXAM: CT ABDOMEN AND PELVIS WITHOUT CONTRAST  TECHNIQUE: Multidetector CT imaging of the abdomen and pelvis was performed following the standard protocol without IV contrast.  RADIATION DOSE REDUCTION: This exam was performed according to the departmental dose-optimization program which includes automated exposure control, adjustment of the mA and/or kV according to patient size and/or use of iterative reconstruction technique.  COMPARISON:  02/13/2023  FINDINGS: Lower chest: The medial right base lung nodule has  resolved.  The vertical level of the 2 cm right lower lobe pulmonary nodule shown on the 02/13/2023 exam is not included on today's exam, and accordingly we were not able to determine whether this has resolved or if it represents a lung cancer. Did the patient undergo the recommended chest CT? If not, chest CT at this time is recommended to exclude lung cancer.  Coronary and descending thoracic aortic atherosclerosis.  Hepatobiliary: Nodularity of the liver suggests cirrhosis. Gallbladder absent. Continued mild extrahepatic biliary dilatation.  Pancreas: Unremarkable  Spleen: Splenectomy with regenerative splenic tissue noted.  Adrenals/Urinary Tract: No hydronephrosis or hydroureter. The urinary bladder is empty. Adrenal glands unremarkable. No urinary tract calculi.  Stomach/Bowel: Mild sigmoid colon diverticulosis. 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  Vascular/Lymphatic: Atherosclerosis is present, including aortoiliac atherosclerotic disease. Numerous small and upper normal size retroperitoneal lymph nodes as before.  Reproductive: Uterus absent.  Other: No supplemental non-categorized findings.  Musculoskeletal: Mild grade 1 degenerative anterolisthesis at L3-4. Mild degenerative hip arthropathy bilaterally.  IMPRESSION: 1. The more caudad medial right base lung nodule shown on prior chest CT has resolved. However, vertical level of the 2 cm right lower lobe pulmonary nodule shown on the 02/13/2023 exam is not included on today's exam, and accordingly we were not able to determine whether this has resolved or if it represents a lung cancer. Did the patient undergo the recommended chest CT? If not, chest CT at this time is recommended to exclude lung cancer. 2. Nodularity of the liver suggests cirrhosis. 3. Mild sigmoid colon diverticulosis. 4. Mild grade 1 degenerative anterolisthesis at L3-4. 5. Mild degenerative hip arthropathy bilaterally. 6.  Aortic  Atherosclerosis (ICD10-I70.0).   Electronically Signed By: Ryan Salvage M.D. On: 09/24/2023 20:48   Assessment & Plan:    1. 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  Belvie Clara, MD  [facility] [place]

## 2023-12-15 NOTE — Patient Instructions (Signed)
 Bladder Cancer  Bladder cancer is a condition where abnormal tissue (a tumor) grows in the bladder. The bladder is the organ that holds urine. Two tubes (ureters) carry urine from the kidneys to the bladder. The bladder wall is made of layers of tissue. Cancer that spreads through these layers of the bladder wall becomes more difficult to treat. What increases the risk? The following factors may make you more likely to develop this condition: Smoking. Working where there are risks (occupational exposures), such as working with Animal nutritionist, Administrator, clothing fabric, dyes, chemicals, or paint. Being 42 years of age or older. Being female. Having long-term bladder inflammation. Having a history of cancer. This includes: A family history of bladder cancer. Having had bladder cancer before. Having had certain treatments for cancer before, such as: Medicines to kill cancer cells (chemotherapy). Strong X-ray beams or high-energy capsules to kill cancer cells and shrink tumors (radiation therapy). Having been exposed to arsenic. This is a poisonous substance. What are the signs or symptoms? Early symptoms of this condition include: Blood in your urine. Pain when urinating. Infections of your urinary system (urinary tract infections or UTIs) that happen often. Having to urinate sooner or more often than normal. Late symptoms of this condition include: Not being able to urinate. Pain on one side of your lower back. Loss of appetite. Weight loss. Tiredness (fatigue). Swelling in your feet. Bone pain. How is this diagnosed? This condition is diagnosed based on: Your medical history. A physical exam. Lab tests, such as urine tests. Imaging tests. Your symptoms. You may also have other tests or procedures, such as: A cystoscopy. This involves putting a narrow tube into your urethra. The urethra is the organ that carries urine from your bladder to the outside of your body. This procedure is done to  view the lining of your bladder for tumors. A biopsy. This involves removing a tissue sample to look at under a microscope to check for cancer. Blood tests or imaging tests may be needed. These show how far into the bladder wall cancer has grown, and if cancer has spread to any other parts of your body. Tests may include: CT scan. MRI. Bone scan. X-ray. How is this treated? Your health care provider may recommend one or more types of treatment based on the stage of your cancer. The most common treatments are: Surgery to remove the cancer. Types of surgeries include: Removing a tumor on the inside wall of the bladder (transurethral resection). Removing the bladder (cystectomy). Radiation therapy. This is often combined with chemotherapy. Chemotherapy. Immunotherapy. This uses medicines to help your body's disease-fighting system (immune system) destroy cancer cells. Follow these instructions at home: Take over-the-counter and prescription medicines only as told by your health care provider. If you were prescribed an antibiotic medicine, take it as told by your health care provider. Do not stop using the antibiotic even if you start to feel better. Eat a healthy diet. Some treatments might affect your appetite. Do not use any products that contain nicotine or tobacco. These products include cigarettes, chewing tobacco, and vaping devices, such as e-cigarettes. If you need help quitting, ask your health care provider. Consider joining a support group. This may help you learn to deal with the stress of having bladder cancer. Tell your cancer care team if you develop side effects. Your team may be able to recommend ways to get relief. Keep all follow-up visits. This is important. Where to find more information American Cancer Society (ACS): cancer.org National  Cancer Institute (NCI): cancer.gov Contact a health care provider if: You have symptoms of a UTI. These  include: Fever. Chills. Weakness. Muscle aches. Pain in your abdomen. Urge to urinate that is stronger and happens more often than normal. Burning in the bladder or urethra when you urinate. Get help right away if: There is blood in your urine. You cannot urinate. You have severe pain or other symptoms that do not go away. Summary Bladder cancer is a condition where tumors grow in the bladder. Diagnosis is based on your medical history, a physical exam, lab tests, imaging tests, and your symptoms. Your health care provider may recommend one or more types of treatment based on the stage of your cancer. Consider joining a support group. This may help you learn to deal with the stress of having bladder cancer. This information is not intended to replace advice given to you by your health care provider. Make sure you discuss any questions you have with your health care provider. Document Revised: 02/10/2021 Document Reviewed: 02/10/2021 Elsevier Patient Education  2024 [employer].

## 2023-12-15 NOTE — Progress Notes (Signed)
 BCG Bladder Instillation  BCG # 6 of 6  Due to Bladder Cancer, patient is present today for BCG treatment. The patient was cleaned and prepped in a sterile fashion with Betadinex3 A 14 FR catheter was inserted, urine return was noted 20 ml, urine was Clear yellowin color.  50ml of reconstituted BCG was then instilled into the bladder. The catheter was then removed. Patient tolerated well, no complications were noted The patient given and discussed with BCG education attached to the AVS.   Performed by: Carlos, CMA  Follow up-Keep cysto with MD, McKenzie  Additional comments- Cysto appointment 02/23/2024

## 2023-12-22 ENCOUNTER — Ambulatory Visit: Payer: Self-pay | Admitting: Cardiology

## 2023-12-29 ENCOUNTER — Encounter (INDEPENDENT_AMBULATORY_CARE_PROVIDER_SITE_OTHER): Payer: Self-pay | Admitting: Gastroenterology

## 2024-01-13 ENCOUNTER — Telehealth: Payer: Self-pay | Admitting: Urology

## 2024-01-13 NOTE — Telephone Encounter (Signed)
 Patient called into the office today with general questions/concerns regarding would like to know if Dr Sherrilee can prescribe Clorazepate  7.5mg . She lost her PCP and needs something to help with her anxiety. [facility] Patient may be reached at [phone] to discuss questions.

## 2024-01-14 NOTE — Telephone Encounter (Signed)
 Called pt to let her know that MD McKenzie cannot prescribe her anxiety meds pt voiced her understanding

## 2024-02-03 ENCOUNTER — Encounter: Payer: Self-pay | Admitting: Physical Medicine & Rehabilitation

## 2024-02-03 ENCOUNTER — Encounter: Attending: Physical Medicine & Rehabilitation | Admitting: Physical Medicine & Rehabilitation

## 2024-02-03 DIAGNOSIS — G894 Chronic pain syndrome: Secondary | ICD-10-CM | POA: Insufficient documentation

## 2024-02-03 MED ORDER — HYDROCODONE-ACETAMINOPHEN 10-325 MG PO TABS: 1.0000 | ORAL_TABLET | Freq: Four times a day (QID) | ORAL | 0 refills | Status: DC | PRN

## 2024-02-03 NOTE — Progress Notes (Signed)
 Subjective:    Patient ID: [name], female    DOB: 1950-07-05, 73 y.o.   MRN: [id number]  HPI    HPI 01/01/23   PEARLEY MILLINGTON is a 73 y.o. year old female  who  has a past medical history of Anxiety, Arthritis, Bradycardia, Breast cancer, left breast (HCC) (1970s; 2015), CAD (coronary artery disease), Cancer of skin of leg, 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HCC), Chronic lower back pain, Chronic pain, COPD (chronic obstructive pulmonary disease) (HCC), Coronary artery disease, Depression, GERD (gastroesophageal reflux disease), Heart murmur, Hepatic cirrhosis (HCC) (12/20/2017), Hepatitis, History of blood transfusion, History of hiatal hernia, History of kidney stones, Hyperlipidemia, Hypertension, Hypothyroidism, Kidney stone, Melanoma of forearm, left (HCC), Migraine, Obesity, Stroke (HCC) (04/2003), Tobacco use disorder, and Wears glasses.   They are presenting to PM&R clinic as a new patient for pain management evaluation. They were referred by Dr. Melvenia for treatment of chronic pain.  Patient reports she has had pain in multiple areas for many years.  Reports she had pain in her neck due to arthritis for many years.  Reports she had previous injections in her neck ESI?  X 3 about 10 years ago that did not significantly reduce her overall pain but did help the burning pain she was having in the area.  She said she was seeing a bone doctor and [name].  She reports she previously had cortisone injections in her shoulders more on the left side.  Shoulder pain is doing better recently.  She reports severe swelling and pain around her first [facility] joint.  She also has pain throughout other digits of her bilateral hands due to arthritis.  Additionally she has pain in her knees primarily on the left side.  This pain is worse with ambulation.  For several months she has been having pain around her left jaw.  Reports some shock type pain over her left face.  She has a lot of popping when she  opens and closes her jaw.  Recently had MRI of her face ordered by neurology, results pending.  She also had an MRI of her brain with results pending by neurology due to left-sided weakness rule out additional CVA.   He has a history of depression and anxiety.  Denies HI or SI.  Reports that she lost her son, dog and niece about 1 and half years ago.   Red flag symptoms: No red flags for back pain endorsed in Hx or ROS   Medications tried: Topical medications - denies  Nsaids - Excedrin  used in the past Tylenol   Helps for a short time  Opiates   Hydrocodone  10mg  for [employer], retired in July , now Dr. Melvenia Gabapentin - Allergy to this medication TCAs  nortriptyline  - helping jaw pain  SNRIs  Duloxetine?- not sure      Other treatments: PT-  PT for TMJ jaw, years ago it made the pain worse  TENs unit - denies  Injections- Shoulder injections Surgery - denies  Other  heating pad helps  Interval History 02/01/23  Patient is here for follow-up regarding her chronic neck pain, left-sided facial pain, polyarthralgia.  She had an MRI face completed in October which showed left TMJ degeneration.  Discontinued nortriptyline  because she did not feel like it was helping.  She continues to have left facial pain however it is doing better than it was during her past visit.  She continues to have a lot of pain in her neck and base of  her left thumb.  He also has pain in her left knee and left shoulder.  She continues to use hydrocodone  to manage her pain, she has used this for years.  She is not have any side effects with the medication.  She reports poor mood, did not tolerate sertraline  because she felt it made her mood worse.  She has discontinued this medication.  She reports that she has follow-up with psychiatry scheduled.  Interval history 04/01/2023 Patient is here for follow-up regarding her chronic neck, lower back, left-sided facial pain and polyarthralgia. Worst pain is in her  lower back and neck.   Since her last visit she has had treatment for bladder cancer. She was ordered oxycodone  after the procedure, however only used half a pill.  She has continued using her hydrocodone  10 mg which has been helping to manage her pain.  She does feel that the hydrocodone  she recently picked up from the pharmacy has a different pill shape than previously.  She feels like this version does not work as well as the prior version.  She denies any side effects to the medication.  She reports she is following up with psychiatry regarding her mood.  Denies SI or HI.  Interval history 05/31/2023 Ms. Howland is here regarding her chronic pain.  Chronic neck, lower back, facial pain and pain in multiple joints is largely unchanged from prior visit. She is also having some suprapubic pain related to bladder cancer.  She is currently being treated for her bladder cancer.  She had TURBT by Dr. 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  She is having treatments with BCG bladder instillation. Reports Norco 10 is helping keep her pain under control.  She had been ordered oxycodone  after procedure says she did not use this.  Interval history 07/29/23 Patient reports continued low back pain.  She is generally taking this medication about 3 times a day.  Pain is primarily axial.  She also has pain in her shoulders.  She has pain in her knees left greater than right.  Pain is controlled overall with current dose of Norco.  Denies any side effects with the medication.  She is not taking benzos, was weaned off this by PCP. She reports she completed treatment with chemo for her bladder cancer.  A1c has been up recently. Patient reports her depression/mood is doing better.   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    Interval history 09/28/23 Patient reports she had some additional treatments for bladder cancer.  Last week she had to go to the  hospital for hematuria, this has resolved since yesterday.  She continues to follow-up with urology.  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  Overall she feels like back pain is under control with Norco 10 mg 3-4 times a day.  Back pain is primarily axial worse with activity.  She is not having any side effects with this medication.  Pain medication helps her be active at home and allows her to continue to work at her job.  Interval history 11/29/2023 Reports pain is managed well enough to complete activities of daily living, including housework and stocking shelves at work. Primary complaint is persistent lower back pain, which is worse at night and with lifting. Pain is described as a burning sensation radiating from the low back, around the right hip, and down the back of the right leg. Standing for long periods worsens the pain. Sitting for  10-15 minutes causes numbness in both legs and feet. Also reports ongoing pain in the neck, shoulders, knees, and hands.  Reports significant claustrophobia and panic attacks with closed MRI machines, but has had them before.   Bladder cancer: Currently undergoing BCG treatments.  Cardiovascular: Two cardiac stents. High blood pressure. Has a nuclear stress test scheduled.   Interval history 02/03/24 Reports ongoing back and knee pain. Back pain is exacerbated by cold weather. The pain radiates to the hip and down the leg before resolving. Describes occasional numbness in the legs with prolonged sitting.   The right knee is significantly more painful than the left. Pain medication provides relief, improving rest and functional ability for housework. Use of pain medication increased during a recent period of cold weather.  Knee History: Prior history of injections in the knees. Cortisone (steroid) shots provided temporary relief. Gel injections were also attempted but were reportedly ineffective, with a previous provider noting buildup?  on imaging.   Additional issues: - Bladder cancer, last chemotherapy treatment one month ago. Follow-up cystoscopy is scheduled for 02/23/2024. - Anxiety. Recently restarted on an clorazepate  by another provider to manage increased symptoms, particularly with the holidays approaching. - Claustrophobia.  Pain Inventory Average Pain 8 Pain Right Now 8 My pain is constant, sharp, tingling, and aching  In the last 24 hours, has pain interfered with the following? General activity 6 Relation with others 4 Enjoyment of life 0 What TIME of day is your pain at its worst? night Sleep (in general) Poor  Pain is worse with: walking, bending, and standing Pain improves with: rest and medication Relief from Meds: 10  Family History  Problem Relation Age of Onset   Heart failure Mother    Cancer Father    Heart failure Father    Cancer Sister    Dementia Sister    Heart disease Other    Arthritis Other    Cancer Other    Diabetes Other    Kidney disease Other    Cancer Sister    Heart failure Brother    Social History   Socioeconomic History   Marital status: Divorced    Spouse name: Not on file   Number of children: 2   Years of education: Not on file   Highest education level: Not on file  Occupational History   Occupation: Disabled   Occupation: retired  Tobacco Use   Smoking status: Some Days    Current packs/day: 0.50    Average packs/day: 0.5 packs/day for 40.0 years (20.0 ttl pk-yrs)    Types: Cigarettes    Passive exposure: Current   Smokeless tobacco: Never   Tobacco comments:    Trying to quit with use of Chantix and calm as of 04/13/2023 but restarted in February with 2 cigarettes per week.   Vaping Use   Vaping status: Never Used  Substance and Sexual Activity   Alcohol use: Not Currently   Drug use: Never   Sexual activity: Not Currently  Other Topics Concern   Not on file  Social History Narrative   ** Merged History Encounter **       Right handed    Wears glasses    Drinks 1 cup of coffee daily   Drinks sweet tea twice a week.   Social Drivers of Corporate Investment Banker Strain: Low Risk  (12/15/2022)   Overall Financial Resource Strain (CARDIA)    Difficulty of Paying Living Expenses: Not hard at all  Food Insecurity:  No Food Insecurity (12/15/2022)   Hunger Vital Sign    Worried About Running Out of Food in the Last Year: Never true    Ran Out of Food in the Last Year: Never true  Transportation Needs: No Transportation Needs (12/15/2022)   PRAPARE - Administrator, Civil Service (Medical): No    Lack of Transportation (Non-Medical): No  Physical Activity: Sufficiently Active (12/15/2022)   Exercise Vital Sign    Days of Exercise per Week: 7 days    Minutes of Exercise per Session: 30 min  Stress: Stress Concern Present (12/15/2022)   [employer] of Occupational Health - Occupational Stress Questionnaire    Feeling of Stress : To some extent  Social Connections: Moderately Isolated (12/15/2022)   Social Connection and Isolation Panel    Frequency of Communication with Friends and Family: More than three times a week    Frequency of Social Gatherings with Friends and Family: More than three times a week    Attends Religious Services: More than 4 times per year    Active Member of Clubs or Organizations: No    Attends Banker Meetings: Never    Marital Status: Divorced   Past Surgical History:  Procedure Laterality Date   ABDOMINAL HYSTERECTOMY  1978   APPENDECTOMY     BIOPSY  04/28/2022   Procedure: BIOPSY;  Surgeon: Eartha Angelia Sieving, MD;  [facility];  Service: Gastroenterology;;   BLADDER INSTILLATION N/A 04/29/2023   Procedure: BLADDER INSTILLATION- gemcitabine ;  Surgeon: Sherrilee Belvie CROME, MD;  Location: [facility];  Service: Urology;  Laterality: N/A;   BREAST BIOPSY Left 1970s; 2015   BREAST LUMPECTOMY Left 1970s   BREAST LUMPECTOMY WITH NEEDLE LOCALIZATION Left  07/07/2013   Procedure: BREAST LUMPECTOMY WITH NEEDLE LOCALIZATION;  Surgeon: Deward GORMAN Curvin DOUGLAS, MD;  Location: [facility];  Service: General;  Laterality: Left;   CARDIAC CATHETERIZATION N/A 01/22/2016   Procedure: Left Heart Cath and Coronary Angiography;  Surgeon: Peter M Jordan, MD;  Location: [facility] INVASIVE CV LAB;  Service: Cardiovascular;  Laterality: N/A;   CARDIAC CATHETERIZATION N/A 01/22/2016   Procedure: Coronary Stent Intervention;  Surgeon: Peter M Jordan, MD;  Location: [facility] INVASIVE CV LAB;  Service: Cardiovascular;  Laterality: N/A;   CAROTID STENT Left 04/2003   small left parietal and left hemispheric CVA/notes 07/29/2010   CHOLECYSTECTOMY OPEN     COLONOSCOPY  2011   COLONOSCOPY N/A 05/10/2019   Procedure: COLONOSCOPY;  Surgeon: Golda Claudis PENNER, MD;  [facility];  Service: Endoscopy;  Laterality: N/A;  1:45   COLONOSCOPY WITH PROPOFOL  N/A 09/01/2022   Procedure: COLONOSCOPY WITH PROPOFOL ;  Surgeon: Eartha Angelia Sieving, MD;  [facility];  Service: Gastroenterology;  Laterality: N/A;  1:45PM;ASA 3   CORONARY ATHERECTOMY N/A 12/29/2019   Procedure: CORONARY ATHERECTOMY;  Surgeon: Dann Candyce GORMAN, MD;  Location: [facility] INVASIVE CV LAB;  Service: Cardiovascular;  Laterality: N/A;  Prox RCA   CORONARY STENT INTERVENTION N/A 12/29/2019   Procedure: CORONARY STENT INTERVENTION;  Surgeon: Dann Candyce GORMAN, MD;  Location: [facility] INVASIVE CV LAB;  Service: Cardiovascular;  Laterality: N/A;  Prox RCA   CORONARY ULTRASOUND/IVUS N/A 12/29/2019   Procedure: Intravascular Ultrasound/IVUS;  Surgeon: Dann Candyce GORMAN, MD;  Location: [facility] INVASIVE CV LAB;  Service: Cardiovascular;  Laterality: N/A;   CYSTOSCOPY WITH BIOPSY N/A 04/29/2023   Procedure: CYSTOSCOPY WITH BIOPSY;  Surgeon: Sherrilee Belvie CROME, MD;  [facility];  Service: Urology;  Laterality: N/A;   ESOPHAGEAL DILATION N/A 04/28/2022   Procedure: ESOPHAGEAL DILATION;  Surgeon: Eartha Angelia Sieving, MD;  [facility];  Service: Gastroenterology;  Laterality: N/A;   ESOPHAGOGASTRODUODENOSCOPY  08/06/2011   Procedure: ESOPHAGOGASTRODUODENOSCOPY (EGD);  Surgeon: Claudis RAYMOND Rivet, MD;  [facility];  Service: Endoscopy;  Laterality: N/A;   ESOPHAGOGASTRODUODENOSCOPY N/A 05/10/2019   Procedure: ESOPHAGOGASTRODUODENOSCOPY (EGD);  Surgeon: Rivet Claudis RAYMOND, MD;  [facility];  Service: Endoscopy;  Laterality: N/A;   ESOPHAGOGASTRODUODENOSCOPY (EGD) WITH PROPOFOL  N/A 04/28/2022   Procedure: ESOPHAGOGASTRODUODENOSCOPY (EGD) WITH PROPOFOL ;  Surgeon: Eartha Angelia Sieving, MD;  [facility];  Service: Gastroenterology;  Laterality: N/A;  10:30 am, pt can't move up due to transportation   FLEXIBLE SIGMOIDOSCOPY  08/06/2011   Procedure: FLEXIBLE SIGMOIDOSCOPY;  Surgeon: Claudis RAYMOND Rivet, MD;  [facility];  Service: Endoscopy;  Laterality: N/A;   HEMOSTASIS CLIP PLACEMENT  09/01/2022   Procedure: HEMOSTASIS CLIP PLACEMENT;  Surgeon: Eartha Angelia Sieving, MD;  [facility];  Service: Gastroenterology;;   INGUINAL HERNIA REPAIR Left 1970s   IR ANGIO INTRA EXTRACRAN SEL COM CAROTID INNOMINATE BILAT MOD SED  11/01/2020   IR ANGIO VERTEBRAL SEL VERTEBRAL BILAT MOD SED  11/01/2020   IR RADIOLOGIST EVAL & MGMT  09/23/2020   IR US  GUIDE VASC ACCESS RIGHT  11/01/2020   LARYNX SURGERY  1970s   Polyps excised   LEFT HEART CATH AND CORONARY ANGIOGRAPHY N/A 12/29/2019   Procedure: LEFT HEART CATH AND CORONARY ANGIOGRAPHY;  Surgeon: Dann Candyce RAMAN, MD;  Location: [facility] INVASIVE CV LAB;  Service: Cardiovascular;  Laterality: N/A;   LEFT HEART CATH AND CORONARY ANGIOGRAPHY N/A 10/12/2022   Procedure: LEFT HEART CATH AND CORONARY ANGIOGRAPHY;  Surgeon: Dann Candyce RAMAN, MD;  Location: [facility] INVASIVE CV LAB;  Service: Cardiovascular;  Laterality: N/A;   MALONEY DILATION  05/10/2019   Procedure: AGAPITO DILATION;  Surgeon:  Rivet Claudis RAYMOND, MD;  [facility];  Service: Endoscopy;;   MELANOMA EXCISION Left ~ 2016   forearm   POLYPECTOMY  05/10/2019   Procedure: POLYPECTOMY;  Surgeon: Rivet Claudis RAYMOND, MD;  [facility];  Service: Endoscopy;;  colon   POLYPECTOMY  09/01/2022   Procedure: POLYPECTOMY INTESTINAL;  Surgeon: Eartha Angelia Sieving, MD;  [facility];  Service: Gastroenterology;;   SPLENECTOMY, 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   spontaneous rupture   SPLENECTOMY, TOTAL     SUBMUCOSAL LIFTING INJECTION  09/01/2022   Procedure: SUBMUCOSAL LIFTING INJECTION;  Surgeon: Eartha Angelia Sieving, MD;  [facility];  Service: Gastroenterology;;   SUBMUCOSAL TATTOO INJECTION  09/01/2022   Procedure: SUBMUCOSAL TATTOO INJECTION;  Surgeon: Eartha Angelia Sieving, MD;  [facility];  Service: Gastroenterology;;   TEMPORARY PACEMAKER N/A 12/29/2019   Procedure: TEMPORARY PACEMAKER;  Surgeon: Dann Candyce RAMAN, MD;  Location: [facility] INVASIVE CV LAB;  Service: Cardiovascular;  Laterality: N/A;   TRANSURETHRAL RESECTION OF BLADDER TUMOR N/A 03/15/2023   Procedure: TRANSURETHRAL RESECTION OF BLADDER TUMOR (TURBT)with Gemcitibane;  Surgeon: Sherrilee Belvie CROME, MD;  [facility];  Service: Urology;  Laterality: N/A;   TRANSURETHRAL RESECTION OF BLADDER TUMOR N/A 04/29/2023   Procedure: TRANSURETHRAL RESECTION OF BLADDER TUMOR (TURBT);  Surgeon: Sherrilee Belvie CROME, MD;  [facility]: [facility];  Service: Urology;  Laterality: N/A;   TRANSURETHRAL RESECTION OF BLADDER TUMOR N/A 09/09/2023   Procedure: TURBT (TRANSURETHRAL RESECTION OF BLADDER TUMOR);  Surgeon: Sherrilee Belvie CROME, MD;  [facility]: [facility];  Service: Urology;  Laterality: N/A;  with gemcitabine  instillation   Past Surgical History:  Procedure Laterality Date   ABDOMINAL HYSTERECTOMY  1978   APPENDECTOMY     BIOPSY  04/28/2022   Procedure: BIOPSY;  Surgeon: Eartha Angelia Sieving, MD;  [facility];   Service: Gastroenterology;;   BLADDER INSTILLATION N/A 04/29/2023   Procedure: BLADDER INSTILLATION- gemcitabine ;  Surgeon: Sherrilee Belvie CROME, MD;  [facility];  Service: Urology;  Laterality: N/A;   BREAST BIOPSY Left 1970s; 2015   BREAST LUMPECTOMY Left 1970s   BREAST LUMPECTOMY WITH NEEDLE LOCALIZATION Left 07/07/2013   Procedure: BREAST LUMPECTOMY WITH NEEDLE LOCALIZATION;  Surgeon: Deward GORMAN Curvin DOUGLAS, MD;  Location: [facility];  Service: General;  Laterality: Left;   CARDIAC CATHETERIZATION N/A 01/22/2016   Procedure: Left Heart Cath and Coronary Angiography;  Surgeon: Peter M Jordan, MD;  Location: [facility] INVASIVE CV LAB;  Service: Cardiovascular;  Laterality: N/A;   CARDIAC CATHETERIZATION N/A 01/22/2016   Procedure: Coronary Stent Intervention;  Surgeon: Peter M Jordan, MD;  Location: [facility] INVASIVE CV LAB;  Service: Cardiovascular;  Laterality: N/A;   CAROTID STENT Left 04/2003   small left parietal and left hemispheric CVA/notes 07/29/2010   CHOLECYSTECTOMY OPEN     COLONOSCOPY  2011   COLONOSCOPY N/A 05/10/2019   Procedure: COLONOSCOPY;  Surgeon: Golda Claudis PENNER, MD;  [facility];  Service: Endoscopy;  Laterality: N/A;  1:45   COLONOSCOPY WITH PROPOFOL  N/A 09/01/2022   Procedure: COLONOSCOPY WITH PROPOFOL ;  Surgeon: Eartha Angelia Sieving, MD;  [facility];  Service: Gastroenterology;  Laterality: N/A;  1:45PM;ASA 3   CORONARY ATHERECTOMY N/A 12/29/2019   Procedure: CORONARY ATHERECTOMY;  Surgeon: Dann Candyce GORMAN, MD;  Location: [facility] INVASIVE CV LAB;  Service: Cardiovascular;  Laterality: N/A;  Prox RCA   CORONARY STENT INTERVENTION N/A 12/29/2019   Procedure: CORONARY STENT INTERVENTION;  Surgeon: Dann Candyce GORMAN, MD;  Location: [facility] INVASIVE CV LAB;  Service: Cardiovascular;  Laterality: N/A;  Prox RCA   CORONARY ULTRASOUND/IVUS N/A 12/29/2019   Procedure: Intravascular Ultrasound/IVUS;  Surgeon: Dann Candyce GORMAN, MD;  Location: [facility]  INVASIVE CV LAB;  Service: Cardiovascular;  Laterality: N/A;   CYSTOSCOPY WITH BIOPSY N/A 04/29/2023   Procedure: CYSTOSCOPY WITH BIOPSY;  Surgeon: Sherrilee Belvie CROME, MD;  Location[facility];  Service: Urology;  Laterality: N/A;   ESOPHAGEAL DILATION N/A 04/28/2022   Procedure: ESOPHAGEAL DILATION;  Surgeon: Eartha Angelia Sieving, MD;  [facility];  Service: Gastroenterology;  Laterality: N/A;   ESOPHAGOGASTRODUODENOSCOPY  08/06/2011   Procedure: ESOPHAGOGASTRODUODENOSCOPY (EGD);  Surgeon: Claudis PENNER Golda, MD;  [facility];  Service: Endoscopy;  Laterality: N/A;   ESOPHAGOGASTRODUODENOSCOPY N/A 05/10/2019   Procedure: ESOPHAGOGASTRODUODENOSCOPY (EGD);  Surgeon: Golda Claudis PENNER, MD;  [facility];  Service: Endoscopy;  Laterality: N/A;   ESOPHAGOGASTRODUODENOSCOPY (EGD) WITH PROPOFOL  N/A 04/28/2022   Procedure: ESOPHAGOGASTRODUODENOSCOPY (EGD) WITH PROPOFOL ;  Surgeon: Eartha Angelia Sieving, MD;  [facility];  Service: Gastroenterology;  Laterality: N/A;  10:30 am, pt can't move up due to transportation   FLEXIBLE SIGMOIDOSCOPY  08/06/2011   Procedure: FLEXIBLE SIGMOIDOSCOPY;  Surgeon: Claudis PENNER Golda, MD;  [facility];  Service: Endoscopy;  Laterality: N/A;   HEMOSTASIS CLIP PLACEMENT  09/01/2022   Procedure: HEMOSTASIS CLIP PLACEMENT;  Surgeon: Eartha Angelia Sieving, MD;  [facility];  Service: Gastroenterology;;   INGUINAL HERNIA REPAIR Left 1970s   IR ANGIO INTRA EXTRACRAN SEL COM  CAROTID INNOMINATE BILAT MOD SED  11/01/2020   IR ANGIO VERTEBRAL SEL VERTEBRAL BILAT MOD SED  11/01/2020   IR RADIOLOGIST EVAL & MGMT  09/23/2020   IR US  GUIDE VASC ACCESS RIGHT  11/01/2020   LARYNX SURGERY  1970s   Polyps excised   LEFT HEART CATH AND CORONARY ANGIOGRAPHY N/A 12/29/2019   Procedure: LEFT HEART CATH AND CORONARY ANGIOGRAPHY;  Surgeon: Dann Candyce RAMAN, MD;  Location: [facility] INVASIVE CV LAB;  Service: Cardiovascular;   Laterality: N/A;   LEFT HEART CATH AND CORONARY ANGIOGRAPHY N/A 10/12/2022   Procedure: LEFT HEART CATH AND CORONARY ANGIOGRAPHY;  Surgeon: Dann Candyce RAMAN, MD;  Location: [facility] INVASIVE CV LAB;  Service: Cardiovascular;  Laterality: N/A;   MALONEY DILATION  05/10/2019   Procedure: AGAPITO DILATION;  Surgeon: Golda Claudis PENNER, MD;  [facility];  Service: Endoscopy;;   MELANOMA EXCISION Left ~ 2016   forearm   POLYPECTOMY  05/10/2019   Procedure: POLYPECTOMY;  Surgeon: Golda Claudis PENNER, MD;  [facility];  Service: Endoscopy;;  colon   POLYPECTOMY  09/01/2022   Procedure: POLYPECTOMY INTESTINAL;  Surgeon: Eartha Angelia Sieving, MD;  [facility];  Service: Gastroenterology;;   SPLENECTOMY, 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   spontaneous rupture   SPLENECTOMY, TOTAL     SUBMUCOSAL LIFTING INJECTION  09/01/2022   Procedure: SUBMUCOSAL LIFTING INJECTION;  Surgeon: Eartha Angelia Sieving, MD;  [facility];  Service: Gastroenterology;;   SUBMUCOSAL TATTOO INJECTION  09/01/2022   Procedure: SUBMUCOSAL TATTOO INJECTION;  Surgeon: Eartha Angelia Sieving, MD;  [facility];  Service: Gastroenterology;;   TEMPORARY PACEMAKER N/A 12/29/2019   Procedure: TEMPORARY PACEMAKER;  Surgeon: Dann Candyce RAMAN, MD;  Location: [facility] INVASIVE CV LAB;  Service: Cardiovascular;  Laterality: N/A;   TRANSURETHRAL RESECTION OF BLADDER TUMOR N/A 03/15/2023   Procedure: TRANSURETHRAL RESECTION OF BLADDER TUMOR (TURBT)with Gemcitibane;  Surgeon: Sherrilee Belvie CROME, MD;  [facility];  Service: Urology;  Laterality: N/A;   TRANSURETHRAL RESECTION OF BLADDER TUMOR N/A 04/29/2023   Procedure: TRANSURETHRAL RESECTION OF BLADDER TUMOR (TURBT);  Surgeon: Sherrilee Belvie CROME, MD;  [facility];  Service: Urology;  Laterality: N/A;   TRANSURETHRAL RESECTION OF BLADDER TUMOR N/A 09/09/2023   Procedure: TURBT (TRANSURETHRAL RESECTION OF BLADDER TUMOR);  Surgeon: Sherrilee Belvie CROME, MD;  Location: [facility];  Service: Urology;  Laterality: N/A;  with gemcitabine  instillation   Past Medical History:  Diagnosis Date   Anxiety    Arthritis    hands, arms, back, neck, knees, fingers (01/21/2016)   Bradycardia    Breast cancer, left breast (HCC) 1970s; 2015   CAD (coronary artery disease)    a. s/p NSTEMI in 2017 with DES to OM and unsuccessful PCI to RCA with wire dissection b. DES to RCA in 2021   Cancer of skin of leg    BLE   Carotid artery dissection    left   Chronic lower back pain    Chronic pain    COPD (chronic obstructive pulmonary disease) (HCC)    Coronary artery disease    Depression    GERD (gastroesophageal reflux disease)    Heart murmur    Hepatic cirrhosis (HCC) 12/20/2017   Hepatitis    History of blood transfusion    related to spleen   History of hiatal hernia    History of kidney stones    Hyperlipidemia    Hypertension    Hypothyroidism    Long term prescription  benzodiazepine use 12/10/2022   Melanoma of forearm, left (HCC)    Migraine    a few migraines/year (01/21/2016)   Obesity    Polypharmacy 12/10/2022   Stroke (HCC) 04/2003   2 carotid artery stents in place; small left parietal and left hemispheric CVA.hazeline 01/21/2016   Tobacco use disorder    50 pack years; questionably discontinued in 2010   Wears glasses    BP (!) 155/85   Pulse 86   Ht 5' 3 (1.6 m)   Wt 187 lb (84.8 kg)   SpO2 91%   BMI 33.13 kg/m   Opioid Risk Score:   Fall Risk Score:  `1  Depression screen [facility] 2/9     11/29/2023   11:35 AM 09/28/2023   10:18 AM 07/29/2023   10:56 AM 05/31/2023   10:17 AM 04/01/2023   12:48 PM 02/01/2023    9:35 AM 01/21/2023    9:33 AM  Depression screen PHQ 2/9  Decreased Interest 1 1 0 0 0 3 1  Down, Depressed, Hopeless 1 1 0 0 0 3 3  PHQ - 2 Score 2 2 0 0 0 6 4  Altered sleeping       3  Tired, decreased energy       3  Change in appetite       1  Feeling bad or failure about yourself        0   Trouble concentrating       0  Moving slowly or fidgety/restless       0  PHQ-9 Score       11   Difficult doing work/chores       Somewhat difficult     Data saved with a previous flowsheet row definition     Review of Systems  Constitutional: Negative.   HENT: Negative.    Eyes: Negative.   Respiratory: Negative.    Cardiovascular: Negative.   Gastrointestinal: Negative.   Endocrine: Negative.   Genitourinary: Negative.   Musculoskeletal:  Positive for arthralgias and back pain.       Pain in both knees, both hands  Skin: Negative.   Allergic/Immunologic: Negative.   Neurological: Negative.   Hematological: Negative.   Psychiatric/Behavioral:  Positive for dysphoric mood.   All other systems reviewed and are negative.      Objective:   Physical Exam   Gen: no distress, normal appearing HEENT: oral mucosa pink and moist, NCAT Chest: normal effort, normal rate of breathing Abd: soft, non-distended Ext: no edema Psych: pleasant, normal affect Skin: intact Neuro: Alert and oriented x3, follows simple commands, fluent speech, comprehension intact Strength 5 out of 5 in right upper and right lower extremity Strength 4 out of 5 in left upper extremity and left lower extremity  MSK: Tenderness over right lateral lumbar spine  Mild pain reported with right hip internal and external rotation  Slump test results and pain in right thigh  Right greater than left knee tenderness  Prior exam Decreased/alerted sensation L face Facial movements intact b/l Strength 5/5 RUE and RLE Strength 4/5 LUE and LLE Musculoskeletal:  Arthritic changes in b/l hands noted L knee tenderness to palpation Mild B/L hand tenderness  L 1st CMC pain with palpation and PROM, mild swelling around this joint + L TMJ tenderness and clicking  + TTP L spine and C spine paraspinal muscles + trapezius tenderness to palpation, Mild L shoulder tenderness Spurling's- neck pain resulted  Slump test  negative  Xray R hand  2020 Right hand x-rays   Right wrist pain   No fracture dislocation or malalignment joint spaces seem preserved   Impression normal x-ray of the right hand   Xray L hand  2020  Left hand pain swelling numbness tingling   AP lateral oblique of the left hand no fracture dislocation or bony arthritis is seen.  Alignment looks normal.   Impression normal left hand x-ray   L knee Xray  12/25/21 On my read appears to show evidence  mild OA     C spine xary 2017 FINDINGS: Bones are osteopenic. Advanced spondylosis and degenerative disc disease at C5-6 with disc space loss, sclerosis and large osteophytes mild degenerative changes at other levels. Mild diffuse facet arthropathy. Facets appear aligned. Foraminal encroachment suspected of the left C5-6 foramen related to bony spurring. Trachea is midline. Odontoid is intact.   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   No acute osseous finding by plain radiography.       CT C spine 2016 IMPRESSION: 1. Cervical spine degenerative changes. 2. Straightening of the normal cervical lordosis. 3. 9 mm right lobe thyroid  nodule, too small to characterize, but most likely benign in the absence of known clinical risk factors for thyroid  carcinoma.   MRI Face and brain 12/23/22 Brain: No evidence of acute infarct, intracranial hemorrhage, mass, midline shift or extra-axial fluid collection. No pathologic enhancement. Mild for age chronic microvascular ischemic change.   Trigeminal nerves: No mass is identified along the course of the trigeminal nerves on thin-slice imaging. No evidence of neurovascular compression.   Vascular: Major arterial flow voids are maintained at the skull base.   Sinuses/Orbits: Clear sinuses. No acute or significant orbital findings.   Soft tissues: Unremarkable.  No visible edema or mass lesion.    Osseous: Normal marrow signal without focal lesion. TMJs are located. Left TMJ degenerative change.   Other: None.   IMPRESSION: 1. No evidence of acute abnormality intracranially or in the face. 2. Left TMJ degenerative change.   '  02/01/23 Xray thumb left FINDINGS: Bones are osteopenic. Normal alignment without acute osseous finding or fracture. Moderate degenerative arthropathy of the left first CMC joint, first MCP joint, and the thumb interphalangeal joint. Soft tissues unremarkable.   IMPRESSION: Osteopenia and degenerative changes as above. No acute finding by plain radiography.   Assessment & Plan:   1) Chronic neck pain with prior imaging C spine spondylosis 2) Polyarthralgia including bilateral knees, L shoulder pain, b/l hand pain.   3) L sided facial pain likely due to TMJ pain.             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  #120 ordered -Patient discontinued nortriptyline  since her last visit she did not feel like this was helping -Continue UDS and pill counts.  Continue PDMP monitoring.  Pain contract completed prior visit. -Pill counts consistent  -Patient reports she is seeing psychiatry-has been weaning clorazepate , she was prescribed clorazepate  11/5, discussed risk of using this medication while on opioids.  Advise not  using both medications at the same time -Consider Cymbalta -MRI of the lumbar spine ordered to evaluate for nerve impingement. Will be scheduled at an [facility].  Patient needs to call and schedule.  Number provided and advised her to call. -Patient reports she has claustrophobia, has been ordered anxiety  medication by her mental health provider as above

## 2024-02-08 ENCOUNTER — Other Ambulatory Visit (HOSPITAL_COMMUNITY): Payer: Self-pay | Admitting: Nurse Practitioner

## 2024-02-08 DIAGNOSIS — Z13820 Encounter for screening for osteoporosis: Secondary | ICD-10-CM

## 2024-02-23 ENCOUNTER — Other Ambulatory Visit: Admitting: Urology

## 2024-02-27 ENCOUNTER — Other Ambulatory Visit: Payer: Self-pay | Admitting: Cardiology

## 2024-03-06 ENCOUNTER — Other Ambulatory Visit: Payer: Self-pay | Admitting: Cardiology

## 2024-03-15 ENCOUNTER — Telehealth: Payer: Self-pay | Admitting: Urology

## 2024-03-15 NOTE — Telephone Encounter (Signed)
 Patient calling the office for samples of medication:   1.  What medication and dosage are you requesting samples for? Gemtesa   2.  Are you currently out of this medication?  1  Wants to pick up today

## 2024-03-15 NOTE — Telephone Encounter (Signed)
 4 pack Gemtesa  sample up front. Pt made aware

## 2024-03-30 ENCOUNTER — Other Ambulatory Visit (HOSPITAL_COMMUNITY): Payer: Self-pay | Admitting: Nurse Practitioner

## 2024-03-30 DIAGNOSIS — Z1231 Encounter for screening mammogram for malignant neoplasm of breast: Secondary | ICD-10-CM

## 2024-04-04 ENCOUNTER — Encounter: Admitting: Physical Medicine & Rehabilitation

## 2024-04-04 ENCOUNTER — Telehealth: Payer: Self-pay | Admitting: Physical Medicine & Rehabilitation

## 2024-04-04 NOTE — Telephone Encounter (Signed)
 Patient needs a refill on hydrocodone .  She said she runs out on 04/08/24.  Please send to [facility].

## 2024-04-05 ENCOUNTER — Other Ambulatory Visit: Payer: Self-pay | Admitting: Physical Medicine & Rehabilitation

## 2024-04-05 DIAGNOSIS — G894 Chronic pain syndrome: Secondary | ICD-10-CM

## 2024-04-05 MED ORDER — HYDROCODONE-ACETAMINOPHEN 10-325 MG PO TABS: 1.0000 | ORAL_TABLET | Freq: Four times a day (QID) | ORAL | 0 refills | Status: AC | PRN

## 2024-04-05 NOTE — Addendum Note (Signed)
 Addended by: URBANO ALBRIGHT on: 04/05/2024 03:51 PM   Modules accepted: Orders

## 2024-04-06 ENCOUNTER — Ambulatory Visit (HOSPITAL_COMMUNITY)

## 2024-04-06 ENCOUNTER — Inpatient Hospital Stay (HOSPITAL_COMMUNITY): Admission: RE | Admit: 2024-04-06 | Source: Ambulatory Visit

## 2024-04-13 ENCOUNTER — Encounter: Admitting: Physical Medicine & Rehabilitation

## 2024-04-19 ENCOUNTER — Ambulatory Visit: Admitting: Urology

## 2024-04-19 VITALS — BP 131/71 | HR 93

## 2024-04-19 DIAGNOSIS — C672 Malignant neoplasm of lateral wall of bladder: Secondary | ICD-10-CM

## 2024-04-19 LAB — URINALYSIS, ROUTINE W REFLEX MICROSCOPIC
Bilirubin, UA: NEGATIVE
Ketones, UA: NEGATIVE
Nitrite, UA: NEGATIVE
Protein,UA: NEGATIVE
Specific Gravity, UA: 1.015 (ref 1.005–1.030)
Urobilinogen, Ur: 0.2 mg/dL (ref 0.2–1.0)
pH, UA: 5.5 (ref 5.0–7.5)

## 2024-04-19 LAB — MICROSCOPIC EXAMINATION
Bacteria, UA: NONE SEEN
RBC, Urine: >30 /HPF — AB (ref 0–2)

## 2024-04-19 MED ORDER — CIPROFLOXACIN HCL 500 MG PO TABS
500.0000 mg | ORAL_TABLET | Freq: Once | ORAL | Status: AC
  Administered 2024-04-19: 500 mg via ORAL

## 2024-04-19 NOTE — Progress Notes (Unsigned)
" ° °  04/19/24  CC: ***   HPI:  Blood pressure 131/71, pulse 93. NED. A&Ox3.   No respiratory distress   Abd soft, NT, ND Normal external genitalia with patent urethral meatus  Cystoscopy Procedure Note  Patient identification was confirmed, informed consent was obtained, and patient was prepped using Betadine solution.  Lidocaine  jelly was administered per urethral meatus.    Procedure: - Flexible cystoscope introduced, without any difficulty.   - Thorough search of the bladder revealed:    normal urethral meatus    normal urothelium    no stones    Posterior wall 5mm ulceration    no urethral polyps    no trabeculation  - Ureteral orifices were normal in position and appearance.  Post-Procedure: - Patient tolerated the procedure well  Assessment/ Plan: Urine for cytology -followup 3 months for cystoscopy   No follow-ups on file.  [name] Clara, MD  "

## 2024-04-28 ENCOUNTER — Encounter: Attending: Physical Medicine & Rehabilitation | Admitting: Physical Medicine & Rehabilitation

## 2024-07-26 ENCOUNTER — Other Ambulatory Visit: Admitting: Urology
